# Patient Record
Sex: Male | Born: 1960
Health system: Southern US, Community
[De-identification: ages and names within clinical notes are randomized; demographics above are authoritative.]

## PROBLEM LIST (undated history)

## (undated) DIAGNOSIS — E119 Type 2 diabetes mellitus without complications: Secondary | ICD-10-CM

## (undated) DIAGNOSIS — G709 Myoneural disorder, unspecified: Secondary | ICD-10-CM

## (undated) DIAGNOSIS — I10 Essential (primary) hypertension: Secondary | ICD-10-CM

## (undated) DIAGNOSIS — E785 Hyperlipidemia, unspecified: Secondary | ICD-10-CM

## (undated) HISTORY — DX: Essential (primary) hypertension: I10

## (undated) HISTORY — DX: Hyperlipidemia, unspecified: E78.5

## (undated) HISTORY — DX: Myoneural disorder, unspecified: G70.9

## (undated) HISTORY — DX: Type 2 diabetes mellitus without complications: E11.9

## (undated) HISTORY — PX: NO PAST SURGERIES: SHX2092

---

## 2014-06-27 ENCOUNTER — Ambulatory Visit (HOSPITAL_BASED_OUTPATIENT_CLINIC_OR_DEPARTMENT_OTHER): Payer: Self-pay | Admitting: *Deleted

## 2014-06-27 ENCOUNTER — Encounter: Payer: Self-pay | Admitting: Internal Medicine

## 2014-06-27 ENCOUNTER — Ambulatory Visit: Payer: Self-pay | Attending: Internal Medicine | Admitting: Internal Medicine

## 2014-06-27 VITALS — BP 142/102 | HR 100 | Temp 98.1°F | Resp 16 | Ht 67.0 in | Wt 158.0 lb

## 2014-06-27 DIAGNOSIS — Z113 Encounter for screening for infections with a predominantly sexual mode of transmission: Secondary | ICD-10-CM | POA: Insufficient documentation

## 2014-06-27 DIAGNOSIS — F17210 Nicotine dependence, cigarettes, uncomplicated: Secondary | ICD-10-CM | POA: Insufficient documentation

## 2014-06-27 DIAGNOSIS — F17200 Nicotine dependence, unspecified, uncomplicated: Secondary | ICD-10-CM

## 2014-06-27 DIAGNOSIS — Z23 Encounter for immunization: Secondary | ICD-10-CM

## 2014-06-27 DIAGNOSIS — IMO0001 Reserved for inherently not codable concepts without codable children: Secondary | ICD-10-CM

## 2014-06-27 DIAGNOSIS — Z720 Tobacco use: Secondary | ICD-10-CM

## 2014-06-27 DIAGNOSIS — R030 Elevated blood-pressure reading, without diagnosis of hypertension: Secondary | ICD-10-CM | POA: Insufficient documentation

## 2014-06-27 DIAGNOSIS — Z0000 Encounter for general adult medical examination without abnormal findings: Secondary | ICD-10-CM | POA: Insufficient documentation

## 2014-06-27 NOTE — Progress Notes (Signed)
Patient ID: Casey Petty, male   DOB: 29-Jun-1961, 53 y.o.   MRN: [id number]  YQM:[id number]  BMW:[id number]  DOB - 1960-11-05  CC:  Chief Complaint  Patient presents with  . Establish Care       HPI: Casey Petty is a 53 y.o. male with no past medical history, here today to establish medical care.  Patient has no c/o today.  He does not currently take any medications.  He is requesting a physical exam. Reports that his BP was controlled when he was incarcerated.  He denies receiving medication when he was incarcerated.   PMHx: none Surgical Hx: none Social Hx: tobacco use--7 cigs per day, denies drug use. Drinks two cans of beer/day   Patient has No headache, No chest pain, No abdominal pain - No Nausea, No new weakness tingling or numbness, No Cough - SOB.  No Known Allergies History reviewed. No pertinent past medical history. No current outpatient prescriptions on file prior to visit.   No current facility-administered medications on file prior to visit.   History reviewed. No pertinent family history. History   Social History  . Marital Status: Single    Spouse Name: N/A    Number of Children: N/A  . Years of Education: N/A   Occupational History  . Not on file.   Social History Main Topics  . Smoking status: Heavy Tobacco Smoker  . Smokeless tobacco: Not on file  . Alcohol Use: 1.2 oz/week    2 Not specified per week  . Drug Use: No  . Sexual Activity: No   Other Topics Concern  . Not on file   Social History Narrative  . No narrative on file    Review of Systems  All other systems reviewed and are negative.     Objective:   Filed Vitals:   06/27/14 1509  BP: 161/100  Pulse: 100  Temp: 98.1 F (36.7 C)  Resp: 16    Physical Exam: Constitutional: Patient appears well-developed and well-nourished. No distress. HENT: Normocephalic, atraumatic, External right and left ear normal. Oropharynx is clear and moist.  Eyes: Conjunctivae and EOM are  normal. PERRLA, no scleral icterus. Neck: Normal ROM. Neck supple. No JVD. No tracheal deviation. No thyromegaly. CVS: RRR, S1/S2 +, no murmurs, no gallops, no carotid bruit.  Pulmonary: Effort and breath sounds normal, no stridor, rhonchi, wheezes, rales.  Abdominal: Soft. BS +, no distension, tenderness, rebound or guarding.  Musculoskeletal: Normal range of motion. No edema and no tenderness.  Lymphadenopathy: No lymphadenopathy noted, cervical Neuro: Alert. Normal reflexes, muscle tone coordination. No cranial nerve deficit. Skin: Skin is warm and dry. No rash noted. Not diaphoretic. No erythema. No pallor. Psychiatric: Normal mood and affect. Behavior, judgment, thought content normal.  No results found for: WBC, HGB, HCT, MCV, PLT No results found for: CREATININE, BUN, NA, K, CL, CO2  No results found for: HGBA1C Lipid Panel  No results found for: CHOL, TRIG, HDL, CHOLHDL, VLDL, LDLCALC     Assessment and plan:   Casey Petty was seen today for establish care.  Diagnoses and associated orders for this visit:  Annual physical exam - CBC with Differential; Future - COMPLETE METABOLIC PANEL WITH GFR; Future - Lipid panel; Future - PSA; Future  Elevated BP Patient refused medication today, I will have him follow up in 3 weeks for a BP check and further education.     Explained to patient diet modifications that may contribute to elevated BP. Patient will avoid foods that are  high in sodium such as  canned soups and vegetables, tomato juice, commercial baked goods, commercially prepared frozen or canned entrees and sauces. Avoid salty snacks, added salt when cooking, and substituting for low sodium herbs or spices Explained exercise regimen of cardio at least three times weekly to help lower BP and cholesterol  Smoking Smoking cessation discussed for 3 minutes, patient is not willing to quit at this time. Will continue to assess on each visit. Discussed increased risk for diseases  such as cancer, heart disease, and stroke.   Screening for STD (sexually transmitted disease) - GC/chlamydia probe amp, urine   Return in about 3 weeks (around 07/18/2014) for Nurse Visit-BP check and lab visit.     Chari Manning, NP-C [facility] [phone] 06/27/2014, 3:50 PM

## 2014-06-27 NOTE — Patient Instructions (Signed)

## 2014-06-27 NOTE — Progress Notes (Signed)
Pt is here to establish care. Pt is requesting a physical. Pt has no C.C.  Or a history of an illness.

## 2014-06-30 ENCOUNTER — Ambulatory Visit: Payer: Self-pay | Admitting: Internal Medicine

## 2014-07-04 LAB — GC/CHLAMYDIA PROBE AMP, URINE
CHLAMYDIA, SWAB/URINE, PCR: NEGATIVE
GC PROBE AMP, URINE: NEGATIVE

## 2014-07-07 ENCOUNTER — Telehealth: Payer: Self-pay | Admitting: *Deleted

## 2014-07-07 NOTE — Telephone Encounter (Signed)
Pt aware of test results

## 2014-07-07 NOTE — Telephone Encounter (Signed)
-----   Message from Lance Bosch, NP sent at 07/07/2014 12:09 AM EST ----- Negative for GC/Chlamydia

## 2014-07-18 ENCOUNTER — Ambulatory Visit: Payer: Medicaid Other | Attending: Internal Medicine | Admitting: *Deleted

## 2014-07-18 DIAGNOSIS — Z0000 Encounter for general adult medical examination without abnormal findings: Secondary | ICD-10-CM | POA: Insufficient documentation

## 2014-07-18 LAB — CBC WITH DIFFERENTIAL/PLATELET
BASOS ABS: 0 10*3/uL (ref 0.0–0.1)
Basophils Relative: 0 % (ref 0–1)
EOS ABS: 0.4 10*3/uL (ref 0.0–0.7)
EOS PCT: 5 % (ref 0–5)
HCT: 43.7 % (ref 39.0–52.0)
Hemoglobin: 15.1 g/dL (ref 13.0–17.0)
Lymphocytes Relative: 40 % (ref 12–46)
Lymphs Abs: 3 10*3/uL (ref 0.7–4.0)
MCH: 30.6 pg (ref 26.0–34.0)
MCHC: 34.6 g/dL (ref 30.0–36.0)
MCV: 88.6 fL (ref 78.0–100.0)
MPV: 10.5 fL (ref 9.4–12.4)
Monocytes Absolute: 0.5 10*3/uL (ref 0.1–1.0)
Monocytes Relative: 7 % (ref 3–12)
NEUTROS PCT: 48 % (ref 43–77)
Neutro Abs: 3.6 10*3/uL (ref 1.7–7.7)
Platelets: 212 10*3/uL (ref 150–400)
RBC: 4.93 MIL/uL (ref 4.22–5.81)
RDW: 14.4 % (ref 11.5–15.5)
WBC: 7.6 10*3/uL (ref 4.0–10.5)

## 2014-07-18 LAB — COMPLETE METABOLIC PANEL WITH GFR
ALT: 41 U/L (ref 0–53)
AST: 52 U/L — AB (ref 0–37)
Albumin: 4.3 g/dL (ref 3.5–5.2)
Alkaline Phosphatase: 71 U/L (ref 39–117)
BILIRUBIN TOTAL: 0.7 mg/dL (ref 0.2–1.2)
BUN: 15 mg/dL (ref 6–23)
CALCIUM: 9.4 mg/dL (ref 8.4–10.5)
CHLORIDE: 103 meq/L (ref 96–112)
CO2: 19 mEq/L (ref 19–32)
Creat: 1.14 mg/dL (ref 0.50–1.35)
GFR, Est African American: 86 mL/min
GFR, Est Non African American: 74 mL/min
Glucose, Bld: 117 mg/dL — ABNORMAL HIGH (ref 70–99)
Potassium: 4.6 mEq/L (ref 3.5–5.3)
SODIUM: 136 meq/L (ref 135–145)
Total Protein: 7.3 g/dL (ref 6.0–8.3)

## 2014-07-18 LAB — LIPID PANEL
Cholesterol: 133 mg/dL (ref 0–200)
HDL: 38 mg/dL — AB (ref >39)
TRIGLYCERIDES: 411 mg/dL — AB (ref <150)
Total CHOL/HDL Ratio: 3.5 Ratio

## 2014-07-18 MED ORDER — AMLODIPINE BESYLATE 10 MG PO TABS: 10.0000 mg | ORAL_TABLET | Freq: Every day | ORAL | Status: DC

## 2014-07-18 NOTE — Progress Notes (Signed)
Patient presents for BP check and fasting blood work Takes no meds but is willing to start on BP med if BP remains elevated today Smoking 4 cigs/day States following low sodium diet  BP 178/96 left arm manually with adult cuff P 90 R 16  T  98.7 oral SPO2  98%  Per Provider: Begin amlodipine 10 mg daily (e-scribed to [facility] on [address]) Return in 2 weeks for nurse visit for BP check

## 2014-07-19 LAB — PSA: PSA: 0.42 ng/mL (ref <=4.00)

## 2014-07-21 ENCOUNTER — Telehealth: Payer: Self-pay | Admitting: *Deleted

## 2014-07-21 NOTE — Telephone Encounter (Signed)
Attempted to reach patient to discuss lab results, however, line is busy. Will call back.

## 2014-08-05 ENCOUNTER — Ambulatory Visit: Payer: Medicaid Other | Attending: Internal Medicine

## 2014-08-05 NOTE — Progress Notes (Unsigned)
Patient ID: Casey Petty, male   DOB: October 16, 1960, 53 y.o.   MRN: [id number] Pt comes in for Blood pressure recheck after started on Amlodipine 10 mg tab 2 weeks ago during nurse triage visit. Pt is compliant with taking medication daily Admits to smoking 4 cigarettes/daily with watching Sodium intake/reading labels Education given on smoking cessation  BP-144/83 73 denies headache,blurred vision or dizziness Instructed pt to continue taking prescribed medication regularly and cut back on smoking We will monitor BP during next visit/DASH diet literature given

## 2014-08-05 NOTE — Patient Instructions (Signed)
Hypertension Hypertension, commonly called high blood pressure, is when the force of blood pumping through your arteries is too strong. Your arteries are the blood vessels that carry blood from your heart throughout your body. A blood pressure reading consists of a higher number over a lower number, such as 110/72. The higher number (systolic) is the pressure inside your arteries when your heart pumps. The lower number (diastolic) is the pressure inside your arteries when your heart relaxes. Ideally you want your blood pressure below 120/80. Hypertension forces your heart to work harder to pump blood. Your arteries may become narrow or stiff. Having hypertension puts you at risk for heart disease, stroke, and other problems.  RISK FACTORS Some risk factors for high blood pressure are controllable. Others are not.  Risk factors you cannot control include:   Race. You may be at higher risk if you are African American.  Age. Risk increases with age.  Gender. Men are at higher risk than women before age 45 years. After age 65, women are at higher risk than men. Risk factors you can control include:  Not getting enough exercise or physical activity.  Being overweight.  Getting too much fat, sugar, calories, or salt in your diet.  Drinking too much alcohol. SIGNS AND SYMPTOMS Hypertension does not usually cause signs or symptoms. Extremely high blood pressure (hypertensive crisis) may cause headache, anxiety, shortness of breath, and nosebleed. DIAGNOSIS  To check if you have hypertension, your health care provider will measure your blood pressure while you are seated, with your arm held at the level of your heart. It should be measured at least twice using the same arm. Certain conditions can cause a difference in blood pressure between your right and left arms. A blood pressure reading that is higher than normal on one occasion does not mean that you need treatment. If one blood pressure reading  is high, ask your health care provider about having it checked again. TREATMENT  Treating high blood pressure includes making lifestyle changes and possibly taking medicine. Living a healthy lifestyle can help lower high blood pressure. You may need to change some of your habits. Lifestyle changes may include:  Following the DASH diet. This diet is high in fruits, vegetables, and whole grains. It is low in salt, red meat, and added sugars.  Getting at least 2 hours of brisk physical activity every week.  Losing weight if necessary.  Not smoking.  Limiting alcoholic beverages.  Learning ways to reduce stress. If lifestyle changes are not enough to get your blood pressure under control, your health care provider may prescribe medicine. You may need to take more than one. Work closely with your health care provider to understand the risks and benefits. HOME CARE INSTRUCTIONS  Have your blood pressure rechecked as directed by your health care provider.   Take medicines only as directed by your health care provider. Follow the directions carefully. Blood pressure medicines must be taken as prescribed. The medicine does not work as well when you skip doses. Skipping doses also puts you at risk for problems.   Do not smoke.   Monitor your blood pressure at home as directed by your health care provider. SEEK MEDICAL CARE IF:   You think you are having a reaction to medicines taken.  You have recurrent headaches or feel dizzy.  You have swelling in your ankles.  You have trouble with your vision. SEEK IMMEDIATE MEDICAL CARE IF:  You develop a severe headache or confusion.    You have unusual weakness, numbness, or feel faint.  You have severe chest or abdominal pain.  You vomit repeatedly.  You have trouble breathing. MAKE SURE YOU:   Understand these instructions.  Will watch your condition.  Will get help right away if you are not doing well or get worse. Document  Released: 07/25/2005 Document Revised: 12/09/2013 Document Reviewed: 05/17/2013 ExitCare Patient Information 2015 [employer]. This information is not intended to replace advice given to you by your health care provider. Make sure you discuss any questions you have with your health care provider. DASH Eating Plan DASH stands for "Dietary Approaches to Stop Hypertension." The DASH eating plan is a healthy eating plan that has been shown to reduce high blood pressure (hypertension). Additional health benefits may include reducing the risk of type 2 diabetes mellitus, heart disease, and stroke. The DASH eating plan may also help with weight loss. WHAT DO I NEED TO KNOW ABOUT THE DASH EATING PLAN? For the DASH eating plan, you will follow these general guidelines:  Choose foods with a percent daily value for sodium of less than 5% (as listed on the food label).  Use salt-free seasonings or herbs instead of table salt or sea salt.  Check with your health care provider or pharmacist before using salt substitutes.  Eat lower-sodium products, often labeled as "lower sodium" or "no salt added."  Eat fresh foods.  Eat more vegetables, fruits, and low-fat dairy products.  Choose whole grains. Look for the word "whole" as the first word in the ingredient list.  Choose fish and skinless chicken or turkey more often than red meat. Limit fish, poultry, and meat to 6 oz (170 g) each day.  Limit sweets, desserts, sugars, and sugary drinks.  Choose heart-healthy fats.  Limit cheese to 1 oz (28 g) per day.  Eat more home-cooked food and less restaurant, buffet, and fast food.  Limit fried foods.  Cook foods using methods other than frying.  Limit canned vegetables. If you do use them, rinse them well to decrease the sodium.  When eating at a restaurant, ask that your food be prepared with less salt, or no salt if possible. WHAT FOODS CAN I EAT? Seek help from a dietitian for individual  calorie needs. Grains Whole grain or whole wheat bread. Brown rice. Whole grain or whole wheat pasta. Quinoa, bulgur, and whole grain cereals. Low-sodium cereals. Corn or whole wheat flour tortillas. Whole grain cornbread. Whole grain crackers. Low-sodium crackers. Vegetables Fresh or frozen vegetables (raw, steamed, roasted, or grilled). Low-sodium or reduced-sodium tomato and vegetable juices. Low-sodium or reduced-sodium tomato sauce and paste. Low-sodium or reduced-sodium canned vegetables.  Fruits All fresh, canned (in natural juice), or frozen fruits. Meat and Other Protein Products Ground beef (85% or leaner), grass-fed beef, or beef trimmed of fat. Skinless chicken or turkey. Ground chicken or turkey. Pork trimmed of fat. All fish and seafood. Eggs. Dried beans, peas, or lentils. Unsalted nuts and seeds. Unsalted canned beans. Dairy Low-fat dairy products, such as skim or 1% milk, 2% or reduced-fat cheeses, low-fat ricotta or cottage cheese, or plain low-fat yogurt. Low-sodium or reduced-sodium cheeses. Fats and Oils Tub margarines without trans fats. Light or reduced-fat mayonnaise and salad dressings (reduced sodium). Avocado. Safflower, olive, or canola oils. Natural peanut or almond butter. Other Unsalted popcorn and pretzels. The items listed above may not be a complete list of recommended foods or beverages. Contact your dietitian for more options. WHAT FOODS ARE NOT RECOMMENDED? Grains White bread.   White pasta. White rice. Refined cornbread. Bagels and croissants. Crackers that contain trans fat. Vegetables Creamed or fried vegetables. Vegetables in a cheese sauce. Regular canned vegetables. Regular canned tomato sauce and paste. Regular tomato and vegetable juices. Fruits Dried fruits. Canned fruit in light or heavy syrup. Fruit juice. Meat and Other Protein Products Fatty cuts of meat. Ribs, chicken wings, bacon, sausage, bologna, salami, chitterlings, fatback, hot dogs,  bratwurst, and packaged luncheon meats. Salted nuts and seeds. Canned beans with salt. Dairy Whole or 2% milk, cream, half-and-half, and cream cheese. Whole-fat or sweetened yogurt. Full-fat cheeses or blue cheese. Nondairy creamers and whipped toppings. Processed cheese, cheese spreads, or cheese curds. Condiments Onion and garlic salt, seasoned salt, table salt, and sea salt. Canned and packaged gravies. Worcestershire sauce. Tartar sauce. Barbecue sauce. Teriyaki sauce. Soy sauce, including reduced sodium. Steak sauce. Fish sauce. Oyster sauce. Cocktail sauce. Horseradish. Ketchup and mustard. Meat flavorings and tenderizers. Bouillon cubes. Hot sauce. Tabasco sauce. Marinades. Taco seasonings. Relishes. Fats and Oils Butter, stick margarine, lard, shortening, ghee, and bacon fat. Coconut, palm kernel, or palm oils. Regular salad dressings. Other Pickles and olives. Salted popcorn and pretzels. The items listed above may not be a complete list of foods and beverages to avoid. Contact your dietitian for more information. WHERE CAN I FIND MORE INFORMATION? National Heart, Lung, and Blood Institute: www.nhlbi.nih.gov/health/health-topics/topics/dash/ Document Released: 07/14/2011 Document Revised: 12/09/2013 Document Reviewed: 05/29/2013 ExitCare Patient Information 2015 ExitCare, [employer]. This information is not intended to replace advice given to you by your health care provider. Make sure you discuss any questions you have with your health care provider.  

## 2014-08-18 ENCOUNTER — Ambulatory Visit: Payer: Medicaid Other

## 2014-08-22 ENCOUNTER — Other Ambulatory Visit: Payer: Self-pay | Admitting: Internal Medicine

## 2014-08-22 NOTE — Telephone Encounter (Signed)
Patient called to request a med refill for amLODipine (NORVASC) 10 MG tablet. Please f/u with pt. °

## 2014-08-22 NOTE — Telephone Encounter (Signed)
Pt notified still have one more refill at the pharmacy

## 2015-07-24 ENCOUNTER — Emergency Department (HOSPITAL_COMMUNITY)
Admission: EM | Admit: 2015-07-24 | Discharge: 2015-07-24 | Disposition: A | Discharge status: Home / Self Care | Payer: Medicaid Other | Attending: Emergency Medicine | Admitting: Emergency Medicine

## 2015-07-24 ENCOUNTER — Emergency Department (HOSPITAL_COMMUNITY): Payer: Medicaid Other

## 2015-07-24 ENCOUNTER — Encounter (HOSPITAL_COMMUNITY): Payer: Self-pay

## 2015-07-24 DIAGNOSIS — F17200 Nicotine dependence, unspecified, uncomplicated: Secondary | ICD-10-CM | POA: Insufficient documentation

## 2015-07-24 DIAGNOSIS — I10 Essential (primary) hypertension: Secondary | ICD-10-CM | POA: Diagnosis not present

## 2015-07-24 DIAGNOSIS — M546 Pain in thoracic spine: Secondary | ICD-10-CM | POA: Diagnosis present

## 2015-07-24 DIAGNOSIS — R739 Hyperglycemia, unspecified: Secondary | ICD-10-CM

## 2015-07-24 LAB — I-STAT CHEM 8, ED
BUN: 11 mg/dL (ref 6–20)
CALCIUM ION: 1.19 mmol/L (ref 1.12–1.23)
Chloride: 103 mmol/L (ref 101–111)
Creatinine, Ser: 1.1 mg/dL (ref 0.61–1.24)
Glucose, Bld: 121 mg/dL — ABNORMAL HIGH (ref 65–99)
HCT: 49 % (ref 39.0–52.0)
Hemoglobin: 16.7 g/dL (ref 13.0–17.0)
Potassium: 4.4 mmol/L (ref 3.5–5.1)
Sodium: 140 mmol/L (ref 135–145)
TCO2: 25 mmol/L (ref 0–100)

## 2015-07-24 MED ORDER — AMLODIPINE BESYLATE 10 MG PO TABS: 10.0000 mg | ORAL_TABLET | Freq: Every day | ORAL | Status: DC

## 2015-07-24 MED ORDER — AMLODIPINE BESYLATE 5 MG PO TABS
10.0000 mg | ORAL_TABLET | Freq: Once | ORAL | Status: AC
  Administered 2015-07-24: 10 mg via ORAL
  Filled 2015-07-24: qty 2

## 2015-07-24 MED ORDER — NAPROXEN 500 MG PO TABS: 500.0000 mg | ORAL_TABLET | Freq: Two times a day (BID) | ORAL | Status: DC | PRN

## 2015-07-24 NOTE — Discharge Instructions (Signed)
Read the information below.  Use the prescribed medication as directed.  Please discuss all new medications with your pharmacist.  You may return to the Emergency Department at any time for worsening condition or any new symptoms that concern you.     If you develop fevers, loss of control of bowel or bladder, weakness or numbness in your legs, or are unable to walk, return to the ER for a recheck.     Back Pain, Adult Back pain is very common. The pain often gets better over time. The cause of back pain is usually not dangerous. Most people can learn to manage their back pain on their own.  HOME CARE  Watch your back pain for any changes. The following actions may help to lessen any pain you are feeling:  Stay active. Start with short walks on flat ground if you can. Try to walk farther each day.  Exercise regularly as told by your doctor. Exercise helps your back heal faster. It also helps avoid future injury by keeping your muscles strong and flexible.  Do not sit, drive, or stand in one place for more than 30 minutes.  Do not stay in bed. Resting more than 1-2 days can slow down your recovery.  Be careful when you bend or lift an object. Use good form when lifting:  Bend at your knees.  Keep the object close to your body.  Do not twist.  Sleep on a firm mattress. Lie on your side, and bend your knees. If you lie on your back, put a pillow under your knees.  Take medicines only as told by your doctor.  Put ice on the injured area.  Put ice in a plastic bag.  Place a towel between your skin and the bag.  Leave the ice on for 20 minutes, 2-3 times a day for the first 2-3 days. After that, you can switch between ice and heat packs.  Avoid feeling anxious or stressed. Find good ways to deal with stress, such as exercise.  Maintain a healthy weight. Extra weight puts stress on your back. GET HELP IF:   You have pain that does not go away with rest or medicine.  You have  worsening pain that goes down into your legs or buttocks.  You have pain that does not get better in one week.  You have pain at night.  You lose weight.  You have a fever or chills. GET HELP RIGHT AWAY IF:   You cannot control when you poop (bowel movement) or pee (urinate).  Your arms or legs feel weak.  Your arms or legs lose feeling (numbness).  You feel sick to your stomach (nauseous) or throw up (vomit).  You have belly (abdominal) pain.  You feel like you may pass out (faint).   This information is not intended to replace advice given to you by your health care provider. Make sure you discuss any questions you have with your health care provider.   Document Released: 01/11/2008 Document Revised: 08/15/2014 Document Reviewed: 11/26/2013 Elsevier Interactive Patient Education 2016 Elsevier Inc.  Hyperglycemia High blood sugar (hyperglycemia) means that the level of sugar in your blood is higher than it should be. Signs of high blood sugar include:  Feeling thirsty.  Frequent peeing (urinating).  Feeling tired or sleepy.  Dry mouth.  Vision changes.  Feeling weak.  Feeling hungry but losing weight.  Numbness and tingling in your hands or feet.  Headache. When you ignore these signs, your  blood sugar may keep going up. These problems may get worse, and other problems may begin. HOME CARE  Check your blood sugars as told by your doctor. Write down the numbers with the date and time.  Take the right amount of insulin or diabetes pills at the right time. Write down the dose with date and time.  Refill your insulin or diabetes pills before running out.  Watch what you eat. Follow your meal plan.  Drink liquids without sugar, such as water. Check with your doctor if you have kidney or heart disease.  Follow your doctor's orders for exercise. Exercise at the same time of day.  Keep your doctor's appointments. GET HELP RIGHT AWAY IF:   You have trouble  thinking or are confused.  You have fast breathing with fruity smelling breath.  You pass out (faint).  You have 2 to 3 days of high blood sugars and you do not know why.  You have chest pain.  You are feeling sick to your stomach (nauseous) or throwing up (vomiting).  You have sudden vision changes. MAKE SURE YOU:   Understand these instructions.  Will watch your condition.  Will get help right away if you are not doing well or get worse.   This information is not intended to replace advice given to you by your health care provider. Make sure you discuss any questions you have with your health care provider.   Document Released: 05/22/2009 Document Revised: 08/15/2014 Document Reviewed: 03/31/2015 Elsevier Interactive Patient Education 2016 [employer].  Hypertension Hypertension is another name for high blood pressure. High blood pressure forces your heart to work harder to pump blood. A blood pressure reading has two numbers, which includes a higher number over a lower number (example: 110/72). HOME CARE   Have your blood pressure rechecked by your doctor.  Only take medicine as told by your doctor. Follow the directions carefully. The medicine does not work as well if you skip doses. Skipping doses also puts you at risk for problems.  Do not smoke.  Monitor your blood pressure at home as told by your doctor. GET HELP IF:  You think you are having a reaction to the medicine you are taking.  You have repeat headaches or feel dizzy.  You have puffiness (swelling) in your ankles.  You have trouble with your vision. GET HELP RIGHT AWAY IF:   You get a very bad headache and are confused.  You feel weak, numb, or faint.  You get chest or belly (abdominal) pain.  You throw up (vomit).  You cannot breathe very well. MAKE SURE YOU:   Understand these instructions.  Will watch your condition.  Will get help right away if you are not doing well or get worse.    This information is not intended to replace advice given to you by your health care provider. Make sure you discuss any questions you have with your health care provider.   Document Released: 01/11/2008 Document Revised: 07/30/2013 Document Reviewed: 05/17/2013 Elsevier Interactive Patient Education [employer].

## 2015-07-24 NOTE — ED Notes (Signed)
Pt states lower right side started hurting last night. No injuries or precipitating factors. Ambulatory w/ steady gait to room from lobby. Denies any urinary symptoms. Pain worse w/ movement, not worse w/ deep breathing. Did not take any meds or try heat/ice to attempt to relieve symptoms. Pt is hypertensive - states he was taking BP meds at one time but ran out of pills.

## 2015-07-24 NOTE — ED Notes (Signed)
Pt verbalized understanding of d/c instructions, prescriptions, and follow-up care. No further questions/concerns, VSS, ambulatory w/ steady gait (refused wheelchair) 

## 2015-07-24 NOTE — ED Provider Notes (Signed)
CSN: [id number]     Arrival date & time 07/24/15  [id number] History   First MD Initiated Contact with Patient 07/24/15 [phone]     Chief Complaint  Patient presents with  . Back Pain     (Consider location/radiation/quality/duration/timing/severity/associated sxs/prior Treatment) The history is provided by the patient.     Pt with hx HTN (untreated) p/w right upper/mid back pain that began yesterday.  Pain is worse with movement, certain positions, slightly with deep inspiration.  The pain does not radiate.  Took no medications for pain.  Denies fever, CP, cough, SOB, abdominal pain, urinary symptoms. Denies any injury or fall.  No recent travel or immobilization, no leg swelling.   Has PCP but has not been in many months.   Past Medical History  Diagnosis Date  . Hypertension    History reviewed. No pertinent past surgical history. History reviewed. No pertinent family history. Social History  Substance Use Topics  . Smoking status: Current Every Day Smoker  . Smokeless tobacco: None     Comment: Smoking 4 cigs/day  . Alcohol Use: 1.2 oz/week    2 Standard drinks or equivalent per week    Review of Systems  All other systems reviewed and are negative.     Allergies  Review of patient's allergies indicates no known allergies.  Home Medications   Prior to Admission medications   Medication Sig Start Date End Date Taking? Authorizing Provider  amLODipine (NORVASC) 10 MG tablet Take 1 tablet (10 mg total) by mouth daily. Patient not taking: Reported on 07/24/2015 07/18/14   Lance Bosch, NP   BP 162/96 mmHg  Pulse 98  Temp(Src) 98.5 F (36.9 C)  Resp 16  SpO2 96% Physical Exam  Constitutional: He appears well-developed and well-nourished. No distress.  HENT:  Head: Normocephalic and atraumatic.  Neck: Neck supple.  Cardiovascular: Normal rate and regular rhythm.   Pulmonary/Chest: Effort normal and breath sounds normal. No respiratory distress. He has no decreased  breath sounds. He has no wheezes. He has no rhonchi. He has no rales.  Right thoracic back mildly tender to palpation.  NO skin changes or rash.   Abdominal: Soft. He exhibits no distension. There is no tenderness. There is no rebound and no guarding.  Musculoskeletal: He exhibits no edema.  Spine nontender, no crepitus, or stepoffs. Gait is normal.   Neurological: He is alert. He exhibits normal muscle tone.  Skin: He is not diaphoretic.  Nursing note and vitals reviewed.   ED Course  Procedures (including critical care time) Labs Review Labs Reviewed  I-STAT CHEM 8, ED - Abnormal; Notable for the following:    Glucose, Bld 121 (*)    All other components within normal limits    Imaging Review Dg Chest 2 View  07/24/2015  CLINICAL DATA:  Right upper back pain EXAM: CHEST  2 VIEW COMPARISON:  None. FINDINGS: The heart size and mediastinal contours are within normal limits. Both lungs are clear. The visualized skeletal structures are unremarkable. IMPRESSION: No active cardiopulmonary disease. Electronically Signed   By: Franchot Gallo M.D.   On: 07/24/2015 11:32   I have personally reviewed and evaluated these images and lab results as part of my medical decision-making.   EKG Interpretation None      MDM   Final diagnoses:  Essential hypertension  Hyperglycemia  Right-sided thoracic back pain    Afebrile, nontoxic patient with right upper back pain and uncontrolled HTN.  Also found to be hyperglycemic.  Chem 8 otherwise unremarkable.  CXR negative.  No risk factors for PE.  No urinary symptoms to suggest kidney stone.  Likely muscular pain.    D/C home with naprosyn, refill of patient's home Norvasc.  Strongly advised to follow closely with his PCP for HTN maintenance and blood glucose recheck.   Discussed result, findings, treatment, and follow up  with patient.  Pt given return precautions.  Pt verbalizes understanding and agrees with plan.         Clayton Bibles,  PA-C 07/24/15 1327  Julianne Rice, MD 07/28/15 [phone]

## 2016-12-22 ENCOUNTER — Encounter (HOSPITAL_COMMUNITY): Payer: Self-pay | Admitting: Emergency Medicine

## 2016-12-22 ENCOUNTER — Emergency Department (HOSPITAL_COMMUNITY)
Admission: EM | Admit: 2016-12-22 | Discharge: 2016-12-22 | Disposition: A | Discharge status: Home / Self Care | Payer: Medicaid Other | Attending: Emergency Medicine | Admitting: Emergency Medicine

## 2016-12-22 DIAGNOSIS — M545 Low back pain, unspecified: Secondary | ICD-10-CM

## 2016-12-22 DIAGNOSIS — I10 Essential (primary) hypertension: Secondary | ICD-10-CM | POA: Insufficient documentation

## 2016-12-22 DIAGNOSIS — F17200 Nicotine dependence, unspecified, uncomplicated: Secondary | ICD-10-CM | POA: Insufficient documentation

## 2016-12-22 DIAGNOSIS — Z79899 Other long term (current) drug therapy: Secondary | ICD-10-CM | POA: Insufficient documentation

## 2016-12-22 MED ORDER — METHOCARBAMOL 500 MG PO TABS
1000.0000 mg | ORAL_TABLET | Freq: Once | ORAL | Status: AC
  Administered 2016-12-22: 1000 mg via ORAL
  Filled 2016-12-22: qty 2

## 2016-12-22 MED ORDER — NAPROXEN 500 MG PO TABS: 500.0000 mg | ORAL_TABLET | Freq: Two times a day (BID) | ORAL | 0 refills | Status: DC

## 2016-12-22 MED ORDER — IBUPROFEN 800 MG PO TABS
800.0000 mg | ORAL_TABLET | Freq: Once | ORAL | Status: AC
Start: 2016-12-22 — End: 2016-12-22
  Administered 2016-12-22: 800 mg via ORAL
  Filled 2016-12-22: qty 1

## 2016-12-22 MED ORDER — METHOCARBAMOL 500 MG PO TABS: 500.0000 mg | ORAL_TABLET | Freq: Three times a day (TID) | ORAL | 0 refills | Status: DC | PRN

## 2016-12-22 MED ORDER — HYDROCODONE-ACETAMINOPHEN 5-325 MG PO TABS
1.0000 | ORAL_TABLET | Freq: Once | ORAL | Status: AC
  Administered 2016-12-22: 1 via ORAL
  Filled 2016-12-22: qty 1

## 2016-12-22 MED ORDER — TRAMADOL HCL 50 MG PO TABS: 50.0000 mg | ORAL_TABLET | Freq: Four times a day (QID) | ORAL | 0 refills | Status: DC | PRN

## 2016-12-22 MED FILL — METHOCARBAMOL 500 MG TABLET: 500 | 6 days supply | Qty: 20 | Fill #0

## 2016-12-22 MED FILL — traMADol HCL 50 MG TABS: 50 | 2 days supply | Qty: 10 | Fill #0

## 2016-12-22 MED FILL — NAPROXEN 500 MG TABLET: 500 | 15 days supply | Qty: 30 | Fill #0

## 2016-12-22 NOTE — ED Notes (Signed)
Pt states he understands instructions. Home stable with steady gait. 

## 2016-12-22 NOTE — Discharge Instructions (Signed)
Medications as prescribed. Rest. Avoid upright activity or heavy lifting for the next 2-3 days.

## 2016-12-22 NOTE — ED Triage Notes (Signed)
Patient reports right lower back pain onset this week , denies injury , no fall or strenuous activity , denies urinary discomfort /hematuria .

## 2016-12-22 NOTE — ED Notes (Signed)
Pt sitting at bedside , fully dressed and states he is ready to go.

## 2016-12-22 NOTE — ED Provider Notes (Signed)
[facility] DEPT Provider Note   CSN: [id number] Arrival date & time: 12/22/16  [id number]     History   Chief Complaint Chief Complaint  Patient presents with  . Back Pain    HPI Casey Petty is a 56 y.o. male. Chief complaint is right back pain.  HPI: 56 year old male atraumatic right-sided back pain. History of similar episodes one or 2 times in the past. He works with a Scientist, water quality doing traffic control. He has prolonged standing. No particular inciting event with fall or injury. Right-sided intermittent spasms that time. No radiation to left back her right leg. No bowel or bladder symptoms. No red flags history. History of fever malignancy  Past Medical History:  Diagnosis Date  . Hypertension     There are no active problems to display for this patient.   History reviewed. No pertinent surgical history.     Home Medications    Prior to Admission medications   Medication Sig Start Date End Date Taking? Authorizing Provider  amLODipine (NORVASC) 10 MG tablet Take 1 tablet (10 mg total) by mouth daily. 07/24/15   Clayton Bibles, PA-C  methocarbamol (ROBAXIN) 500 MG tablet Take 1 tablet (500 mg total) by mouth 3 (three) times daily between meals as needed. 12/22/16   Tanna Furry, MD  naproxen (NAPROSYN) 500 MG tablet Take 1 tablet (500 mg total) by mouth 2 (two) times daily. 12/22/16   Tanna Furry, MD  traMADol (ULTRAM) 50 MG tablet Take 1 tablet (50 mg total) by mouth every 6 (six) hours as needed. 12/22/16   Tanna Furry, MD    Family History No family history on file.  Social History Social History  Substance Use Topics  . Smoking status: Current Every Day Smoker  . Smokeless tobacco: Never Used     Comment: Smoking 4 cigs/day  . Alcohol use 1.2 oz/week    2 Standard drinks or equivalent per week     Allergies   Patient has no known allergies.   Review of Systems Review of Systems  Constitutional: Negative for appetite change, chills, diaphoresis,  fatigue and fever.  HENT: Negative for mouth sores, sore throat and trouble swallowing.   Eyes: Negative for visual disturbance.  Respiratory: Negative for cough, chest tightness, shortness of breath and wheezing.   Cardiovascular: Negative for chest pain.  Gastrointestinal: Negative for abdominal distention, abdominal pain, diarrhea, nausea and vomiting.  Endocrine: Negative for polydipsia, polyphagia and polyuria.  Genitourinary: Negative for dysuria, frequency and hematuria.  Musculoskeletal: Positive for back pain. Negative for gait problem.  Skin: Negative for color change, pallor and rash.  Neurological: Negative for dizziness, syncope, light-headedness and headaches.  Hematological: Does not bruise/bleed easily.  Psychiatric/Behavioral: Negative for behavioral problems and confusion.     Physical Exam Updated Vital Signs BP (!) 163/103 (BP Location: Right Arm)   Pulse 74   Temp 98 F (36.7 C) (Oral)   Resp 16   Ht 5\' 7"  (1.702 m)   Wt 165 lb (74.8 kg)   SpO2 99%   BMI 25.84 kg/m   Physical Exam  Constitutional: He is oriented to person, place, and time. He appears well-developed and well-nourished. No distress.  HENT:  Head: Normocephalic.  Eyes: Conjunctivae are normal. Pupils are equal, round, and reactive to light. No scleral icterus.  Neck: Normal range of motion. Neck supple. No thyromegaly present.  Cardiovascular: Normal rate and regular rhythm.  Exam reveals no gallop and no friction rub.   No murmur heard. Pulmonary/Chest: Effort normal  and breath sounds normal. No respiratory distress. He has no wheezes. He has no rales.  Abdominal: Soft. Bowel sounds are normal. He exhibits no distension. There is no tenderness. There is no rebound.  Musculoskeletal: Normal range of motion.  Tightness in the right lower back. All right of midline. No midline tenderness or left-sided symptoms.  Neurological: He is alert and oriented to person, place, and time.  Normal  symmetric Strength to shoulder shrug, triceps, biceps, grip,wrist flex/extend,and intrinsics  Norma lsymmetric sensation above and below clavicles, and to all distributions to UEs. Norma symmetric strength to flex/.extend hip and knees, dorsi/plantar flex ankles. Normal symmetric sensation to all distributions to LEs Patellar and achilles reflexes 1-2+. Downgoing Babinski   Skin: Skin is warm and dry. No rash noted.  Psychiatric: He has a normal mood and affect. His behavior is normal.     ED Treatments / Results  Labs (all labs ordered are listed, but only abnormal results are displayed) Labs Reviewed - No data to display  EKG  EKG Interpretation None       Radiology No results found.  Procedures Procedures (including critical care time)  Medications Ordered in ED Medications  HYDROcodone-acetaminophen (NORCO/VICODIN) 5-325 MG per tablet 1 tablet (not administered)  ibuprofen (ADVIL,MOTRIN) tablet 800 mg (not administered)  methocarbamol (ROBAXIN) tablet 1,000 mg (not administered)     Initial Impression / Assessment and Plan / ED Course  I have reviewed the triage vital signs and the nursing notes.  Pertinent labs & imaging results that were available during my care of the patient were reviewed by me and considered in my medical decision making (see chart for details).     Reproducible muscle skeletal pain and spasm. No symptoms or findings to suggest herniated nucleus or altered etiology. These were discussed with the patient. Given Vicodin and Motrin and Robaxin here. We'll discharge with limited number tramadol Robaxin ibuprofen. Primary care follow-up. Return to ER with acute changes as discussed.  Final Clinical Impressions(s) / ED Diagnoses   Final diagnoses:  Acute right-sided low back pain without sciatica    New Prescriptions New Prescriptions   METHOCARBAMOL (ROBAXIN) 500 MG TABLET    Take 1 tablet (500 mg total) by mouth 3 (three) times daily between  meals as needed.   NAPROXEN (NAPROSYN) 500 MG TABLET    Take 1 tablet (500 mg total) by mouth 2 (two) times daily.   TRAMADOL (ULTRAM) 50 MG TABLET    Take 1 tablet (50 mg total) by mouth every 6 (six) hours as needed.     Tanna Furry, MD 12/22/16 [phone]

## 2018-08-08 DIAGNOSIS — I639 Cerebral infarction, unspecified: Secondary | ICD-10-CM

## 2018-08-08 HISTORY — DX: Cerebral infarction, unspecified: I63.9

## 2019-02-28 ENCOUNTER — Emergency Department (HOSPITAL_COMMUNITY): Payer: Medicaid Other

## 2019-02-28 ENCOUNTER — Encounter (HOSPITAL_COMMUNITY): Payer: Self-pay | Admitting: Emergency Medicine

## 2019-02-28 ENCOUNTER — Other Ambulatory Visit: Payer: Self-pay

## 2019-02-28 ENCOUNTER — Observation Stay (HOSPITAL_COMMUNITY): Payer: Medicaid Other

## 2019-02-28 ENCOUNTER — Inpatient Hospital Stay (HOSPITAL_COMMUNITY)
Admission: EM | Admit: 2019-02-28 | Discharge: 2019-03-04 | DRG: 065 | Disposition: A | Discharge status: Rehabilitation Facility | Payer: Medicaid Other | Attending: Internal Medicine | Admitting: Internal Medicine

## 2019-02-28 DIAGNOSIS — M79661 Pain in right lower leg: Secondary | ICD-10-CM | POA: Diagnosis not present

## 2019-02-28 DIAGNOSIS — Z79899 Other long term (current) drug therapy: Secondary | ICD-10-CM

## 2019-02-28 DIAGNOSIS — Z716 Tobacco abuse counseling: Secondary | ICD-10-CM

## 2019-02-28 DIAGNOSIS — R29703 NIHSS score 3: Secondary | ICD-10-CM | POA: Diagnosis present

## 2019-02-28 DIAGNOSIS — Z8249 Family history of ischemic heart disease and other diseases of the circulatory system: Secondary | ICD-10-CM

## 2019-02-28 DIAGNOSIS — I119 Hypertensive heart disease without heart failure: Secondary | ICD-10-CM | POA: Diagnosis present

## 2019-02-28 DIAGNOSIS — Z20828 Contact with and (suspected) exposure to other viral communicable diseases: Secondary | ICD-10-CM | POA: Diagnosis present

## 2019-02-28 DIAGNOSIS — R471 Dysarthria and anarthria: Secondary | ICD-10-CM | POA: Diagnosis present

## 2019-02-28 DIAGNOSIS — T380X5A Adverse effect of glucocorticoids and synthetic analogues, initial encounter: Secondary | ICD-10-CM | POA: Diagnosis not present

## 2019-02-28 DIAGNOSIS — M1711 Unilateral primary osteoarthritis, right knee: Secondary | ICD-10-CM | POA: Diagnosis not present

## 2019-02-28 DIAGNOSIS — I639 Cerebral infarction, unspecified: Secondary | ICD-10-CM | POA: Diagnosis not present

## 2019-02-28 DIAGNOSIS — Z9114 Patient's other noncompliance with medication regimen: Secondary | ICD-10-CM

## 2019-02-28 DIAGNOSIS — I63312 Cerebral infarction due to thrombosis of left middle cerebral artery: Secondary | ICD-10-CM

## 2019-02-28 DIAGNOSIS — R29810 Facial weakness: Secondary | ICD-10-CM | POA: Diagnosis present

## 2019-02-28 DIAGNOSIS — R739 Hyperglycemia, unspecified: Secondary | ICD-10-CM | POA: Diagnosis present

## 2019-02-28 DIAGNOSIS — R0602 Shortness of breath: Secondary | ICD-10-CM | POA: Diagnosis not present

## 2019-02-28 DIAGNOSIS — M7989 Other specified soft tissue disorders: Secondary | ICD-10-CM | POA: Diagnosis not present

## 2019-02-28 DIAGNOSIS — E785 Hyperlipidemia, unspecified: Secondary | ICD-10-CM | POA: Diagnosis present

## 2019-02-28 DIAGNOSIS — G8191 Hemiplegia, unspecified affecting right dominant side: Secondary | ICD-10-CM | POA: Diagnosis present

## 2019-02-28 DIAGNOSIS — I10 Essential (primary) hypertension: Secondary | ICD-10-CM | POA: Diagnosis present

## 2019-02-28 DIAGNOSIS — D72829 Elevated white blood cell count, unspecified: Secondary | ICD-10-CM | POA: Diagnosis not present

## 2019-02-28 DIAGNOSIS — T464X5A Adverse effect of angiotensin-converting-enzyme inhibitors, initial encounter: Secondary | ICD-10-CM | POA: Diagnosis not present

## 2019-02-28 DIAGNOSIS — I6381 Other cerebral infarction due to occlusion or stenosis of small artery: Principal | ICD-10-CM | POA: Diagnosis present

## 2019-02-28 DIAGNOSIS — F17210 Nicotine dependence, cigarettes, uncomplicated: Secondary | ICD-10-CM | POA: Diagnosis present

## 2019-02-28 DIAGNOSIS — R29898 Other symptoms and signs involving the musculoskeletal system: Secondary | ICD-10-CM

## 2019-02-28 DIAGNOSIS — I160 Hypertensive urgency: Secondary | ICD-10-CM | POA: Diagnosis present

## 2019-02-28 DIAGNOSIS — Z720 Tobacco use: Secondary | ICD-10-CM | POA: Diagnosis present

## 2019-02-28 DIAGNOSIS — M5126 Other intervertebral disc displacement, lumbar region: Secondary | ICD-10-CM | POA: Diagnosis not present

## 2019-02-28 DIAGNOSIS — T783XXA Angioneurotic edema, initial encounter: Secondary | ICD-10-CM | POA: Diagnosis not present

## 2019-02-28 LAB — CBC WITH DIFFERENTIAL/PLATELET
Abs Immature Granulocytes: 0.06 10*3/uL (ref 0.00–0.07)
Basophils Absolute: 0.1 10*3/uL (ref 0.0–0.1)
Basophils Relative: 1 %
Eosinophils Absolute: 0.6 10*3/uL — ABNORMAL HIGH (ref 0.0–0.5)
Eosinophils Relative: 5 %
HCT: 47.8 % (ref 39.0–52.0)
Hemoglobin: 15.9 g/dL (ref 13.0–17.0)
Immature Granulocytes: 1 %
Lymphocytes Relative: 32 %
Lymphs Abs: 4.2 10*3/uL — ABNORMAL HIGH (ref 0.7–4.0)
MCH: 31.4 pg (ref 26.0–34.0)
MCHC: 33.3 g/dL (ref 30.0–36.0)
MCV: 94.3 fL (ref 80.0–100.0)
Monocytes Absolute: 1 10*3/uL (ref 0.1–1.0)
Monocytes Relative: 8 %
Neutro Abs: 7.1 10*3/uL (ref 1.7–7.7)
Neutrophils Relative %: 53 %
Platelets: 188 10*3/uL (ref 150–400)
RBC: 5.07 MIL/uL (ref 4.22–5.81)
RDW: 13.5 % (ref 11.5–15.5)
WBC: 13 10*3/uL — ABNORMAL HIGH (ref 4.0–10.5)
nRBC: 0 % (ref 0.0–0.2)

## 2019-02-28 LAB — BASIC METABOLIC PANEL
Anion gap: 10 (ref 5–15)
BUN: 9 mg/dL (ref 6–20)
CO2: 24 mmol/L (ref 22–32)
Calcium: 9.5 mg/dL (ref 8.9–10.3)
Chloride: 104 mmol/L (ref 98–111)
Creatinine, Ser: 0.91 mg/dL (ref 0.61–1.24)
GFR calc Af Amer: >60 mL/min (ref >60)
GFR calc non Af Amer: >60 mL/min (ref >60)
Glucose, Bld: 112 mg/dL — ABNORMAL HIGH (ref 70–99)
Potassium: 3.5 mmol/L (ref 3.5–5.1)
Sodium: 138 mmol/L (ref 135–145)

## 2019-02-28 LAB — ETHANOL: Alcohol, Ethyl (B): <10 mg/dL (ref <10)

## 2019-02-28 MED ORDER — ACETAMINOPHEN 650 MG RE SUPP: 650.0000 mg | RECTAL | Status: DC | PRN

## 2019-02-28 MED ORDER — ASPIRIN 300 MG RE SUPP: 300.0000 mg | Freq: Every day | RECTAL | Status: DC

## 2019-02-28 MED ORDER — ASPIRIN 325 MG PO TABS
325.0000 mg | ORAL_TABLET | Freq: Every day | ORAL | Status: DC
  Administered 2019-03-01: 325 mg via ORAL
  Filled 2019-02-28: qty 1

## 2019-02-28 MED ORDER — STROKE: EARLY STAGES OF RECOVERY BOOK
Freq: Once | Status: AC
  Administered 2019-03-01: 04:00:00
  Filled 2019-02-28 (×2): qty 1

## 2019-02-28 MED ORDER — ACETAMINOPHEN 160 MG/5ML PO SOLN: 650.0000 mg | ORAL | Status: DC | PRN

## 2019-02-28 MED ORDER — ACETAMINOPHEN 325 MG PO TABS
650.0000 mg | ORAL_TABLET | ORAL | Status: DC | PRN
  Administered 2019-03-02 – 2019-03-03 (×2): 650 mg via ORAL
  Filled 2019-02-28 (×2): qty 2

## 2019-02-28 NOTE — ED Provider Notes (Signed)
[facility] DEPT Provider Note   CSN: [id number] Arrival date & time: 02/28/19  2007    History   Chief Complaint Chief Complaint  Patient presents with   Leg Pain    Right Leg    HPI Casey Petty is a 58 y.o. male.     The history is provided by the patient and medical records. No language interpreter was used.  Leg Pain Associated symptoms: no back pain    Casey Petty is a 58 y.o. male  with a PMH of HTN who presents to the Emergency Department complaining of right leg pain, weakness and swelling.  He states that it began 2 days ago.  He denies any known injury. He says that it started bothering him after work. He feels as if his calf / knee are swollen. When asked about his weakness, he tells me since yesterday, he's been dragging his right leg and hobbling around. Denies back pain. Sometimes feels a little numbness to the thigh, but other times not. No saddle anesthesia.  No bowel or bladder incontinence.  No fevers.  Denies any upper extremity numbness or weakness.  No headaches or visual changes.  No difficulty with his speech.   Patient does state he has been told his blood pressure is high before.  He says that he was given blood pressure medications once but it did not have any refills.  This was several years ago.  He never saw a doctor and never took blood pressure medication more than that 1 month when initial prescription was given.   Past Medical History:  Diagnosis Date   Hypertension     Patient Active Problem List   Diagnosis Date Noted   Stroke [facility]) 02/28/2019   Essential hypertension 02/28/2019   Tobacco abuse 02/28/2019   Hyperglycemia 02/28/2019    History reviewed. No pertinent surgical history.      Home Medications    Prior to Admission medications   Medication Sig Start Date End Date Taking? Authorizing Provider  amLODipine (NORVASC) 10 MG tablet Take 1 tablet (10 mg total) by mouth daily. Patient  not taking: Reported on 02/28/2019 07/24/15   Clayton Bibles, PA-C  methocarbamol (ROBAXIN) 500 MG tablet Take 1 tablet (500 mg total) by mouth 3 (three) times daily between meals as needed. Patient not taking: Reported on 02/28/2019 12/22/16   Tanna Furry, MD  naproxen (NAPROSYN) 500 MG tablet Take 1 tablet (500 mg total) by mouth 2 (two) times daily. Patient not taking: Reported on 02/28/2019 12/22/16   Tanna Furry, MD  traMADol (ULTRAM) 50 MG tablet Take 1 tablet (50 mg total) by mouth every 6 (six) hours as needed. Patient not taking: Reported on 02/28/2019 12/22/16   Tanna Furry, MD    Family History History reviewed. No pertinent family history.  Social History Social History   Tobacco Use   Smoking status: Current Every Day Smoker   Smokeless tobacco: Never Used   Tobacco comment: Smoking 4 cigs/day  Substance Use Topics   Alcohol use: Yes    Alcohol/week: 2.0 standard drinks    Types: 2 Standard drinks or equivalent per week   Drug use: No     Allergies   Patient has no known allergies.   Review of Systems Review of Systems  Musculoskeletal: Positive for myalgias. Negative for back pain.  Skin: Negative for color change.  Neurological: Positive for weakness and numbness. Negative for dizziness, facial asymmetry, speech difficulty and headaches.  All other systems reviewed  and are negative.    Physical Exam Updated Vital Signs BP (!) 195/115 (BP Location: Left Arm)    Pulse 71    Temp 97.9 F (36.6 C) (Oral)    Resp 20    Ht 5\' 7"  (1.702 m)    Wt 74.8 kg    SpO2 97%    BMI 25.84 kg/m   Physical Exam Vitals signs and nursing note reviewed.  Constitutional:      General: He is not in acute distress.    Appearance: He is well-developed.  HENT:     Head: Normocephalic and atraumatic.  Neck:     Musculoskeletal: Neck supple.  Cardiovascular:     Rate and Rhythm: Normal rate and regular rhythm.     Heart sounds: Normal heart sounds. No murmur.  Pulmonary:      Effort: Pulmonary effort is normal. No respiratory distress.     Breath sounds: Normal breath sounds.  Abdominal:     General: There is no distension.     Palpations: Abdomen is soft.     Tenderness: There is no abdominal tenderness.  Musculoskeletal:     Comments: 4/5 strength to RLE, 5/5 in upper extremities and LLE.  No tenderness to the RLE. Mild swelling to the medial aspect of the knee. Normal patellar and achilles reflexes. 2+ DP bilaterally.  Skin:    General: Skin is warm and dry.  Neurological:     Mental Status: He is alert and oriented to person, place, and time.     Comments: Alert, oriented, thought content appropriate, able to give a coherent history. Speech is clear and goal oriented, able to follow commands.  Cranial Nerves:  II:  Peripheral visual fields grossly normal, pupils equal, round, reactive to light III, IV, VI: EOM intact bilaterally, ptosis not present V,VII: smile symmetric, eyes kept closed tightly against resistance, facial light touch sensation equal VIII: hearing grossly normal IX, X: symmetric soft palate movement, uvula elevates symmetrically  XI: bilateral shoulder shrug symmetric and strong XII: midline tongue extension Subjective decreased sensation to the right upper thigh when compared to left.  Unsteady gait.      ED Treatments / Results  Labs (all labs ordered are listed, but only abnormal results are displayed) Labs Reviewed  CBC WITH DIFFERENTIAL/PLATELET - Abnormal; Notable for the following components:      Result Value   WBC 13.0 (*)    Lymphs Abs 4.2 (*)    Eosinophils Absolute 0.6 (*)    All other components within normal limits  BASIC METABOLIC PANEL - Abnormal; Notable for the following components:   Glucose, Bld 112 (*)    All other components within normal limits  SARS CORONAVIRUS 2 (HOSPITAL ORDER, [facility])  ETHANOL  HIV ANTIBODY (ROUTINE TESTING W REFLEX)  HEMOGLOBIN A1C  LIPID PANEL     EKG None  Radiology Dg Tibia/fibula Right  Result Date: 02/28/2019 CLINICAL DATA:  Right leg pain and swelling, onset yesterday. EXAM: RIGHT TIBIA AND FIBULA - 2 VIEW COMPARISON:  Concurrent knee radiographs. FINDINGS: Cortical margins of the tibia and fibular intact. Proximal aspect of the lower extremity included on concurrent knee exam. There is no evidence of fracture or other focal bone lesions. Soft tissues are unremarkable. IMPRESSION: Negative radiographs of the right lower extremity. Electronically Signed   By: Keith Rake M.D.   On: 02/28/2019 21:02   [name] Contrast  Result Date: 02/28/2019 CLINICAL DATA:  Initial evaluation for acute  right leg pain. EXAM: MRI HEAD WITHOUT CONTRAST TECHNIQUE: Multiplanar, multiecho pulse sequences of the brain and surrounding structures were obtained without intravenous contrast. COMPARISON:  None available. FINDINGS: Brain: Examination technically limited as the patient was unable to tolerate the full length of the exam. Axial and coronal DWI sequence, with axial T2 and FLAIR sequences, and sagittal T1 weighted sequence only were performed. Additionally, images provided are degraded by motion artifact. Cerebral volume within normal limits for age. No significant cerebral white matter changes identified on this limited exam. 13 mm focus of restricted diffusion seen involving the posterior limb of the left internal capsule, compatible with acute ischemic infarct (series 4, image 25). No associated mass effect or definite hemorrhage on this limited exam. There is an additional possible punctate focus of diffusion abnormality involving the posterior limb of the contralateral right internal capsule (series 4, image 25), which could reflect an additional tiny acute to subacute ischemic infarct. No other evidence for acute or subacute ischemia. Gray-white matter differentiation otherwise maintained. No encephalomalacia seen elsewhere to suggest chronic  cortical infarction. No mass lesion, midline shift or mass effect. Ventricles normal size without hydrocephalus. No extra-axial fluid collection. Vascular: Major intracranial vascular flow voids maintained. Skull and upper cervical spine: Craniocervical junction within normal limits. Scalp soft tissues and calvarium within normal limits. Sinuses/Orbits: Globes and orbital soft tissues demonstrate no acute finding. Mild scattered mucosal thickening throughout the ethmoidal air cells and maxillary sinuses. Trace layering fluid noted within the right maxillary sinus. Paranasal sinuses are otherwise clear. No mastoid effusion. Inner ear structures grossly normal. Other: None. IMPRESSION: 1. Technically limited exam due to the patient's inability to tolerate the full length of the study as well as motion artifact. 2. 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 3. Additional possible punctate acute to early subacute ischemic infarct involving the posterior limb of the contralateral right internal capsule. Electronically Signed   By: Jeannine Boga M.D.   On: 02/28/2019 22:23   [name] Contrast  Result Date: 02/28/2019 CLINICAL DATA:  Initial evaluation for acute right lower extremity pain, no injury. EXAM: MRI LUMBAR SPINE WITHOUT CONTRAST TECHNIQUE: Multiplanar, multisequence MR imaging of the lumbar spine was performed. No intravenous contrast was administered. COMPARISON:  None available. FINDINGS: Segmentation: Standard. Lowest well-formed disc labeled the L5-S1 level. Alignment: Vertebral bodies normally aligned with preservation of the normal lumbar lordosis. No listhesis. Vertebrae: Vertebral body height well maintained without evidence for acute or chronic fracture. Bone marrow signal intensity within normal limits. Mild reactive endplate changes present about the L5-S1 interspace. Reactive marrow edema present about the L4-5 facets bilaterally due  to facet arthritis, right greater than left. No other abnormal marrow edema. No discrete or worrisome osseous lesions. Conus medullaris and cauda equina: Conus extends to the L2 level. Conus and cauda equina appear normal. Paraspinal and other soft tissues: Paraspinous soft tissues within normal limits. Visualized visceral structures unremarkable. 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 L1-2: Negative interspace. Mild bilateral facet hypertrophy. No canal or foraminal stenosis. L2-3: Negative interspace. Mild bilateral facet hypertrophy, slightly worse on the right. No significant canal or foraminal stenosis. No impingement. L3-4: Negative interspace. Moderate right with mild left facet hypertrophy. No significant spinal stenosis. Foramina remain patent. No impingement. L4-5: Mild annular disc bulge with disc desiccation. Moderate facet and ligament flavum hypertrophy. Associated reactive marrow edema due to facet arthritis, right greater than left. No significant spinal stenosis.  Mild bilateral L4 foraminal narrowing without impingement. L5-S1: Mild diffuse disc bulge with disc desiccation. Disc bulging slightly asymmetric to the left. Associated reactive endplate changes with marginal endplate osteophytic spurring. Mild to moderate facet hypertrophy, slightly worse on the left. Central canal remains patent. Moderate bilateral L5 foraminal stenosis, left slightly worse than right. IMPRESSION: 1. Mild degenerative disc bulging with facet hypertrophy at L5-S1 with resultant moderate bilateral L5 foraminal stenosis. Either of the exiting L5 nerve roots could be affected. 2. Disc bulging with moderate facet hypertrophy at L4-5 with resultant mild bilateral L4 foraminal stenosis. No frank impingement. 3. Mild-to-moderate multilevel facet hypertrophy throughout the lumbar spine, most pronounced at L4-5 where there is associated reactive marrow edema. Finding could contribute to underlying  low back pain. Electronically Signed   By: Jeannine Boga M.D.   On: 02/28/2019 22:11   Dg Knee Complete 4 Views Right  Result Date: 02/28/2019 CLINICAL DATA:  Right leg pain and swelling, onset yesterday. EXAM: RIGHT KNEE - COMPLETE 4+ VIEW COMPARISON:  None. FINDINGS: Tricompartmental osteoarthritis with peripheral spurring. Mild medial tibiofemoral joint space narrowing. No significant joint effusion. No fracture, dislocation, or bony destructive change. No focal soft tissue abnormality. 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 Electronically Signed   By: Keith Rake M.D.   On: 02/28/2019 21:01    Procedures Procedures (including critical care time)  Medications Ordered in ED Medications   stroke: mapping our early stages of recovery book (has no administration in time range)  acetaminophen (TYLENOL) tablet 650 mg (has no administration in time range)    Or  acetaminophen (TYLENOL) solution 650 mg (has no administration in time range)    Or  acetaminophen (TYLENOL) suppository 650 mg (has no administration in time range)  aspirin suppository 300 mg (has no administration in time range)    Or  aspirin tablet 325 mg (has no administration in time range)     Initial Impression / Assessment and Plan / ED Course  I have reviewed the triage vital signs and the nursing notes.  Pertinent labs & imaging results that were available during my care of the patient were reviewed by me and considered in my medical decision making (see chart for details).       Casey Petty is a 58 y.o. male who presents to ED for right leg weakness which began yesterday. Denies known injury or inciting event. Does have right leg weakness on exam as well as unsteady gait.  MRI shows 13 mm acute ischemic infarct involving the posterior limb left internal capsule.  Also shows additional possible punctate acute or early subacute ischemic infarct involving the  posterior limb of the contralateral right internal capsule.  Symptoms began greater than 24 hours ago.  Discussed with neurology, Dr. Leonel Ramsay, who recommends admission to [facility] for further stroke work-up.  Hospitalist consulted who will admit.  Patient seen by and discussed with Dr. Stark Jock who agrees with treatment plan.   Final Clinical Impressions(s) / ED Diagnoses   Final diagnoses:  Cerebrovascular accident (CVA), unspecified mechanism [facility])    ED Discharge Orders    None       Dwaine Pringle, Ozella Almond, PA-C 02/28/19 2352    Veryl Speak, MD 03/05/19 [id number]

## 2019-02-28 NOTE — ED Triage Notes (Addendum)
Pt arrived via EMS with complaints of right leg pain the started yesterday after work. Pt has no updated medical history after stating not seeing a provider in 20 years. BP elevated with EMS, pt stated ETOH on board.

## 2019-02-28 NOTE — H&P (Signed)
TRH H&P    Patient Demographics:    Casey Petty, is a 58 y.o. male  MRN: [id number]  DOB - 07-13-1961  Admit Date - 02/28/2019  Referring MD/NP/PA: Roselyn Reef Ward  Outpatient Primary MD for the patient is Patient, [name]  Patient coming from:  home  Chief complaint-  Right leg weakness   HPI:    Casey Petty  is a 58 y.o. male,  w hypertension, presents with c/o right leg pain/weakness starting 5pm Wednesday. Pt denies numbness, tingling.   Pt denies headache, vision change, slurred speech, cp, palp, sob, n/v, abd pain, diarrhea, brbpr, dysuria, hematuria.  Pt is not currently on bp medication. He lives with his wife.    In ED,  T 98.4, P 88 R 20  Bp 188/104, pox 99% on RA Wt 74.8 kg  MRI brain IMPRESSION: 1. Technically limited exam due to the patient's inability to tolerate the full length of the study as well as motion artifact. 2. 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 3. Additional possible punctate acute to early subacute ischemic infarct involving the posterior limb of the contralateral right internal capsule.  MRI Lumbar spine IMPRESSION: 1. Mild degenerative disc bulging with facet hypertrophy at L5-S1 with resultant moderate bilateral L5 foraminal stenosis. Either of the exiting L5 nerve roots could be affected. 2. Disc bulging with moderate facet hypertrophy at L4-5 with resultant mild bilateral L4 foraminal stenosis. No frank impingement. 3. Mild-to-moderate multilevel facet hypertrophy throughout the lumbar spine, most pronounced at L4-5 where there is associated reactive marrow edema. Finding could contribute to underlying low back pain.  ED spoke with neurology and will consult on this case  Pt will be admitted for w/up of stroke.      Review of systems:    In addition to the HPI above,  No Fever-chills, No  Headache, No changes with Vision or hearing, No problems swallowing food or Liquids, No Chest pain, Cough or Shortness of Breath, No Abdominal pain, No Nausea or Vomiting, bowel movements are regular, No Blood in stool or Urine, No dysuria, No new skin rashes or bruises, No new joints pains-aches,   No recent weight gain or loss, No polyuria, polydypsia or polyphagia, No significant Mental Stressors.  All other systems reviewed and are negative.    Past History of the following :    Past Medical History:  Diagnosis Date   Hypertension       History reviewed. No pertinent surgical history. No surgeries per pt   Social History:      Social History   Tobacco Use   Smoking status: Current Every Day Smoker   Smokeless tobacco: Never Used   Tobacco comment: Smoking 4 cigs/day  Substance Use Topics   Alcohol use: Yes    Alcohol/week: 2.0 standard drinks    Types: 2 Standard drinks or equivalent per week    Pt states can go without drinking without any problems   Family History :     Family History  Problem Relation Age of Onset   Heart attack Father        Home Medications:   Prior to Admission medications   Medication Sig Start Date End Date Taking? Authorizing Provider  amLODipine (NORVASC) 10 MG tablet Take 1 tablet (10 mg total) by mouth daily. Patient not taking: Reported on 02/28/2019 07/24/15   Clayton Bibles, PA-C  methocarbamol (ROBAXIN) 500 MG tablet Take 1 tablet (500 mg total) by mouth 3 (three) times daily between meals as needed. Patient not taking: Reported on 02/28/2019 12/22/16   Tanna Furry, MD  naproxen (NAPROSYN) 500 MG tablet Take 1 tablet (500 mg total) by mouth 2 (two) times daily. Patient not taking: Reported on 02/28/2019 12/22/16   Tanna Furry, MD  traMADol (ULTRAM) 50 MG tablet Take 1 tablet (50 mg total) by mouth every 6 (six) hours as needed. Patient not taking: Reported on 02/28/2019 12/22/16   Tanna Furry, MD     Allergies:     No Known Allergies   Physical Exam:   Vitals  Blood pressure (!) 195/115, pulse 71, temperature 97.9 F (36.6 C), temperature source Oral, resp. rate 20, height 5\' 7"  (1.702 m), weight 74.8 kg, SpO2 97 %.  1.  General: axoxo3  2. Psychiatric: euthymic  3. Neurologic: cn2-12 intact, reflexes 2+ symmetric, diffuse with no clonus, downgoing toes bilaterally, motor 5/5 in all 4 ext,  Possibly 5-/5 grip and 5-/5 prox and distal right lower ext strength  4. HEENMT:  Anicteric, pupils 1.23mm symmetric, direct, consensual, near intact eomi Tongue midline Neck: no jvd, no bruit  5. Respiratory : CTAB  6. Cardiovascular : rrr s1, s2, no m/g/r  7. Gastrointestinal:  Abd: soft, nt, nd, +bs  8. Skin:  Ext: no c/c/e,  No rash  9.Musculoskeletal:  Good ROM,    No adenopathy    Data Review:    CBC Recent Labs  Lab 02/28/19 2223  WBC 13.0*  HGB 15.9  HCT 47.8  PLT 188  MCV 94.3  MCH 31.4  MCHC 33.3  RDW 13.5  LYMPHSABS 4.2*  MONOABS 1.0  EOSABS 0.6*  BASOSABS 0.1   ------------------------------------------------------------------------------------------------------------------  Results for orders placed or performed during the hospital encounter of 02/28/19 (from the past 48 hour(s))  CBC with Differential     Status: Abnormal   Collection Time: 02/28/19 10:23 PM  Result Value Ref Range   WBC 13.0 (H) 4.0 - 10.5 K/uL   RBC 5.07 4.22 - 5.81 MIL/uL   Hemoglobin 15.9 13.0 - 17.0 g/dL   HCT 47.8 39.0 - 52.0 %   MCV 94.3 80.0 - 100.0 fL   MCH 31.4 26.0 - 34.0 pg   MCHC 33.3 30.0 - 36.0 g/dL   RDW 13.5 11.5 - 15.5 %   Platelets 188 150 - 400 K/uL   nRBC 0.0 0.0 - 0.2 %   Neutrophils Relative % 53 %   Neutro Abs 7.1 1.7 - 7.7 K/uL   Lymphocytes Relative 32 %   Lymphs Abs 4.2 (H) 0.7 - 4.0 K/uL   Monocytes Relative 8 %   Monocytes Absolute 1.0 0.1 - 1.0 K/uL   Eosinophils Relative 5 %   Eosinophils Absolute 0.6 (H) 0.0 - 0.5 K/uL   Basophils Relative 1 %    Basophils Absolute 0.1 0.0 - 0.1 K/uL   Immature Granulocytes 1 %   Abs Immature Granulocytes 0.06 0.00 - 0.07 K/uL    Comment: Performed at [facility], [place]  [address], [place], [place] [address]  Basic metabolic panel     Status: Abnormal   Collection Time: 02/28/19 10:23 PM  Result Value Ref Range   Sodium 138 135 - 145 mmol/L   Potassium 3.5 3.5 - 5.1 mmol/L   Chloride 104 98 - 111 mmol/L   CO2 24 22 - 32 mmol/L   Glucose, Bld 112 (H) 70 - 99 mg/dL   BUN 9 6 - 20 mg/dL   Creatinine, Ser 0.91 0.61 - 1.24 mg/dL   Calcium 9.5 8.9 - 10.3 mg/dL   GFR calc non Af Amer >60 >60 mL/min   GFR calc Af Amer >60 >60 mL/min   Anion gap 10 5 - 15    Comment: Performed at [facility], [place] [address] [place], [place] [address]  Ethanol     Status: None   Collection Time: 02/28/19 10:23 PM  Result Value Ref Range   Alcohol, Ethyl (B) <10 <10 mg/dL    Comment: (NOTE) Lowest detectable limit for serum alcohol is 10 mg/dL. For medical purposes only. Performed at [facility] [place] [address] [place], [place] [address]   SARS Coronavirus 2 (CEPHEID - Performed in [place] hospital lab), Hosp Order     Status: None   Collection Time: 02/28/19 10:50 PM   Specimen: Nasopharyngeal Swab  Result Value Ref Range   SARS Coronavirus 2 NEGATIVE NEGATIVE    Comment: (NOTE) If result is NEGATIVE SARS-CoV-2 target nucleic acids are NOT DETECTED. The SARS-CoV-2 RNA is generally detectable in upper and lower  respiratory specimens during the acute phase of infection. The lowest  concentration of SARS-CoV-2 viral copies this assay can detect is 250  copies / mL. A negative result does not preclude SARS-CoV-2 infection  and should not be used as the sole basis for treatment or other  patient management decisions.  A negative result may occur with  improper specimen collection / handling, submission of specimen other  than nasopharyngeal swab,  presence of viral mutation(s) within the  areas targeted by this assay, and inadequate number of viral copies  (<250 copies / mL). A negative result must be combined with clinical  observations, patient history, and epidemiological information. If result is POSITIVE SARS-CoV-2 target nucleic acids are DETECTED. The SARS-CoV-2 RNA is generally detectable in upper and lower  respiratory specimens dur ing the acute phase of infection.  Positive  results are indicative of active infection with SARS-CoV-2.  Clinical  correlation with patient history and other diagnostic information is  necessary to determine patient infection status.  Positive results do  not rule out bacterial infection or co-infection with other viruses. If result is PRESUMPTIVE POSTIVE SARS-CoV-2 nucleic acids MAY BE PRESENT.   A presumptive positive result was obtained on the submitted specimen  and confirmed on repeat testing.  While 2019 novel coronavirus  (SARS-CoV-2) nucleic acids may be present in the submitted sample  additional confirmatory testing may be necessary for epidemiological  and / or clinical management purposes  to differentiate between  SARS-CoV-2 and other Sarbecovirus currently known to infect humans.  If clinically indicated additional testing with an alternate test  methodology [phone]) is advised. The SARS-CoV-2 RNA is generally  detectable in upper and lower respiratory sp ecimens during the acute  phase of infection. The expected result is Negative. Fact Sheet for Patients:  StrictlyIdeas.no Fact Sheet for Healthcare Providers: BankingDealers.co.za This test is not yet approved or cleared by the [place] FDA and has been authorized for detection and/or diagnosis of  SARS-CoV-2 by FDA under an Emergency Use Authorization (EUA).  This EUA will remain in effect (meaning this test can be used) for the duration of the COVID-19 declaration under  Section 564(b)(1) of the Act, 21 U.S.C. section 360bbb-3(b)(1), unless the authorization is terminated or revoked sooner. Performed at [facility], [place] [address], [place], [place] [address]     Chemistries  Recent Labs  Lab 02/28/19 2223  NA 138  K 3.5  CL 104  CO2 24  GLUCOSE 112*  BUN 9  CREATININE 0.91  CALCIUM 9.5   ------------------------------------------------------------------------------------------------------------------  ------------------------------------------------------------------------------------------------------------------ GFR: Estimated Creatinine Clearance: 85.8 mL/min (by C-G formula based on SCr of 0.91 mg/dL). Liver Function Tests: No results for input(s): AST, ALT, ALKPHOS, BILITOT, PROT, ALBUMIN in the last 168 hours. No results for input(s): LIPASE, AMYLASE in the last 168 hours. No results for input(s): AMMONIA in the last 168 hours. Coagulation Profile: No results for input(s): INR, PROTIME in the last 168 hours. Cardiac Enzymes: No results for input(s): CKTOTAL, CKMB, CKMBINDEX, TROPONINI in the last 168 hours. BNP (last 3 results) No results for input(s): PROBNP in the last 8760 hours. HbA1C: No results for input(s): HGBA1C in the last 72 hours. CBG: No results for input(s): GLUCAP in the last 168 hours. Lipid Profile: No results for input(s): CHOL, HDL, LDLCALC, TRIG, CHOLHDL, LDLDIRECT in the last 72 hours. Thyroid Function Tests: No results for input(s): TSH, T4TOTAL, FREET4, T3FREE, THYROIDAB in the last 72 hours. Anemia Panel: No results for input(s): VITAMINB12, FOLATE, FERRITIN, TIBC, IRON, RETICCTPCT in the last 72 hours.  --------------------------------------------------------------------------------------------------------------- Urine analysis: No results found for: COLORURINE, APPEARANCEUR, LABSPEC, PHURINE, GLUCOSEU, HGBUR, BILIRUBINUR, KETONESUR, PROTEINUR, UROBILINOGEN, NITRITE,  LEUKOCYTESUR    Imaging Results:    Dg Chest 2 View  Result Date: 03/01/2019 CLINICAL DATA:  Right leg weakness. Shortness of breath. EXAM: CHEST - 2 VIEW COMPARISON:  07/24/2015 FINDINGS: The cardiomediastinal contours are normal. The lungs are clear. Pulmonary vasculature is normal. No consolidation, pleural effusion, or pneumothorax. No acute osseous abnormalities are seen. IMPRESSION: No acute chest findings. Electronically Signed   By: Keith Rake M.D.   On: 03/01/2019 00:17   Dg Tibia/fibula Right  Result Date: 02/28/2019 CLINICAL DATA:  Right leg pain and swelling, onset yesterday. EXAM: RIGHT TIBIA AND FIBULA - 2 VIEW COMPARISON:  Concurrent knee radiographs. FINDINGS: Cortical margins of the tibia and fibular intact. Proximal aspect of the lower extremity included on concurrent knee exam. There is no evidence of fracture or other focal bone lesions. Soft tissues are unremarkable. IMPRESSION: Negative radiographs of the right lower extremity. Electronically Signed   By: Keith Rake M.D.   On: 02/28/2019 21:02   [name] Contrast  Result Date: 02/28/2019 CLINICAL DATA:  Initial evaluation for acute right leg pain. EXAM: MRI HEAD WITHOUT CONTRAST TECHNIQUE: Multiplanar, multiecho pulse sequences of the brain and surrounding structures were obtained without intravenous contrast. COMPARISON:  None available. FINDINGS: Brain: Examination technically limited as the patient was unable to tolerate the full length of the exam. Axial and coronal DWI sequence, with axial T2 and FLAIR sequences, and sagittal T1 weighted sequence only were performed. Additionally, images provided are degraded by motion artifact. Cerebral volume within normal limits for age. No significant cerebral white matter changes identified on this limited exam. 13 mm focus of restricted diffusion seen involving the posterior limb of the left internal capsule, compatible with acute ischemic infarct (series 4, image 25).  No associated mass effect or definite hemorrhage  on this limited exam. There is an additional possible punctate focus of diffusion abnormality involving the posterior limb of the contralateral right internal capsule (series 4, image 25), which could reflect an additional tiny acute to subacute ischemic infarct. No other evidence for acute or subacute ischemia. Gray-white matter differentiation otherwise maintained. No encephalomalacia seen elsewhere to suggest chronic cortical infarction. No mass lesion, midline shift or mass effect. Ventricles normal size without hydrocephalus. No extra-axial fluid collection. Vascular: Major intracranial vascular flow voids maintained. Skull and upper cervical spine: Craniocervical junction within normal limits. Scalp soft tissues and calvarium within normal limits. Sinuses/Orbits: Globes and orbital soft tissues demonstrate no acute finding. Mild scattered mucosal thickening throughout the ethmoidal air cells and maxillary sinuses. Trace layering fluid noted within the right maxillary sinus. Paranasal sinuses are otherwise clear. No mastoid effusion. Inner ear structures grossly normal. Other: None. IMPRESSION: 1. Technically limited exam due to the patient's inability to tolerate the full length of the study as well as motion artifact. 2. 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 3. Additional possible punctate acute to early subacute ischemic infarct involving the posterior limb of the contralateral right internal capsule. Electronically Signed   By: Jeannine Boga M.D.   On: 02/28/2019 22:23   [name] Contrast  Result Date: 02/28/2019 CLINICAL DATA:  Initial evaluation for acute right lower extremity pain, no injury. EXAM: MRI LUMBAR SPINE WITHOUT CONTRAST TECHNIQUE: Multiplanar, multisequence MR imaging of the lumbar spine was performed. No intravenous contrast was administered. COMPARISON:  None available.  FINDINGS: Segmentation: Standard. Lowest well-formed disc labeled the L5-S1 level. Alignment: Vertebral bodies normally aligned with preservation of the normal lumbar lordosis. No listhesis. Vertebrae: Vertebral body height well maintained without evidence for acute or chronic fracture. Bone marrow signal intensity within normal limits. Mild reactive endplate changes present about the L5-S1 interspace. Reactive marrow edema present about the L4-5 facets bilaterally due to facet arthritis, right greater than left. No other abnormal marrow edema. No discrete or worrisome osseous lesions. Conus medullaris and cauda equina: Conus extends to the L2 level. Conus and cauda equina appear normal. Paraspinal and other soft tissues: Paraspinous soft tissues within normal limits. Visualized visceral structures unremarkable. 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 L1-2: Negative interspace. Mild bilateral facet hypertrophy. No canal or foraminal stenosis. L2-3: Negative interspace. Mild bilateral facet hypertrophy, slightly worse on the right. No significant canal or foraminal stenosis. No impingement. L3-4: Negative interspace. Moderate right with mild left facet hypertrophy. No significant spinal stenosis. Foramina remain patent. No impingement. L4-5: Mild annular disc bulge with disc desiccation. Moderate facet and ligament flavum hypertrophy. Associated reactive marrow edema due to facet arthritis, right greater than left. No significant spinal stenosis. Mild bilateral L4 foraminal narrowing without impingement. L5-S1: Mild diffuse disc bulge with disc desiccation. Disc bulging slightly asymmetric to the left. Associated reactive endplate changes with marginal endplate osteophytic spurring. Mild to moderate facet hypertrophy, slightly worse on the left. Central canal remains patent. Moderate bilateral L5 foraminal stenosis, left slightly worse than right. IMPRESSION: 1. Mild degenerative disc  bulging with facet hypertrophy at L5-S1 with resultant moderate bilateral L5 foraminal stenosis. Either of the exiting L5 nerve roots could be affected. 2. Disc bulging with moderate facet hypertrophy at L4-5 with resultant mild bilateral L4 foraminal stenosis. No frank impingement. 3. Mild-to-moderate multilevel facet hypertrophy throughout the lumbar spine, most pronounced at L4-5 where there is associated reactive marrow edema. Finding  could contribute to underlying low back pain. Electronically Signed   By: Jeannine Boga M.D.   On: 02/28/2019 22:11   Dg Knee Complete 4 Views Right  Result Date: 02/28/2019 CLINICAL DATA:  Right leg pain and swelling, onset yesterday. EXAM: RIGHT KNEE - COMPLETE 4+ VIEW COMPARISON:  None. FINDINGS: Tricompartmental osteoarthritis with peripheral spurring. Mild medial tibiofemoral joint space narrowing. No significant joint effusion. No fracture, dislocation, or bony destructive change. No focal soft tissue abnormality. 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 Electronically Signed   By: Keith Rake M.D.   On: 02/28/2019 21:01       Assessment & Plan:    Principal Problem:   Stroke [facility]) Active Problems:   Essential hypertension   Tobacco abuse   Hyperglycemia  Stroke Tele Check UDS Check hga1c, lipid Carotid ultrasound  Cardiac echo PT/OT/speech therapy consult Aspirin, Lipitor 80mg  po qhs  Hypertension uncontrolled Permissive hypertension for now Will start Amlodipine 10mg  po qday in am (previously on this medication?) Hydralazine 5mg  iv q6h prn sbp >225  Hyperglycemia Check hga1c as above, if >6.5 please start fsbs ac and qhs, ISS  Tobacco use Nicotine patch 21mg  topically qday prn  Pt counselled on cessation x 38minutes    DVT Prophylaxis-   Lovenox - SCDs   AM Labs Ordered, also please review Full Orders  Family Communication: Admission, patients condition and plan of care including  tests being ordered have been discussed with the patient  who indicate understanding and agree with the plan and Code Status.  Code Status:  FULL CODE, notified sister that pt admitted to [facility]   Admission status: Observation : Based on patients clinical presentation and evaluation of above clinical data, I have made determination that patient meets observation criteria at this time.   Time spent in minutes : 70 minutes    Jani Gravel M.D on 03/01/2019 at 12:36 AM

## 2019-03-01 ENCOUNTER — Observation Stay (HOSPITAL_COMMUNITY): Payer: Medicaid Other

## 2019-03-01 ENCOUNTER — Inpatient Hospital Stay (HOSPITAL_COMMUNITY): Payer: Medicaid Other

## 2019-03-01 DIAGNOSIS — D72828 Other elevated white blood cell count: Secondary | ICD-10-CM | POA: Diagnosis not present

## 2019-03-01 DIAGNOSIS — D72829 Elevated white blood cell count, unspecified: Secondary | ICD-10-CM | POA: Diagnosis not present

## 2019-03-01 DIAGNOSIS — M1711 Unilateral primary osteoarthritis, right knee: Secondary | ICD-10-CM | POA: Diagnosis not present

## 2019-03-01 DIAGNOSIS — M79661 Pain in right lower leg: Secondary | ICD-10-CM | POA: Diagnosis not present

## 2019-03-01 DIAGNOSIS — Z9114 Patient's other noncompliance with medication regimen: Secondary | ICD-10-CM | POA: Diagnosis not present

## 2019-03-01 DIAGNOSIS — T464X5A Adverse effect of angiotensin-converting-enzyme inhibitors, initial encounter: Secondary | ICD-10-CM | POA: Diagnosis not present

## 2019-03-01 DIAGNOSIS — R7309 Other abnormal glucose: Secondary | ICD-10-CM | POA: Diagnosis not present

## 2019-03-01 DIAGNOSIS — I6381 Other cerebral infarction due to occlusion or stenosis of small artery: Secondary | ICD-10-CM | POA: Diagnosis not present

## 2019-03-01 DIAGNOSIS — R471 Dysarthria and anarthria: Secondary | ICD-10-CM | POA: Diagnosis not present

## 2019-03-01 DIAGNOSIS — R739 Hyperglycemia, unspecified: Secondary | ICD-10-CM | POA: Diagnosis present

## 2019-03-01 DIAGNOSIS — E1165 Type 2 diabetes mellitus with hyperglycemia: Secondary | ICD-10-CM | POA: Diagnosis not present

## 2019-03-01 DIAGNOSIS — G8110 Spastic hemiplegia affecting unspecified side: Secondary | ICD-10-CM | POA: Diagnosis not present

## 2019-03-01 DIAGNOSIS — E119 Type 2 diabetes mellitus without complications: Secondary | ICD-10-CM | POA: Diagnosis not present

## 2019-03-01 DIAGNOSIS — R0989 Other specified symptoms and signs involving the circulatory and respiratory systems: Secondary | ICD-10-CM | POA: Diagnosis not present

## 2019-03-01 DIAGNOSIS — I639 Cerebral infarction, unspecified: Secondary | ICD-10-CM

## 2019-03-01 DIAGNOSIS — I160 Hypertensive urgency: Secondary | ICD-10-CM | POA: Diagnosis not present

## 2019-03-01 DIAGNOSIS — T783XXA Angioneurotic edema, initial encounter: Secondary | ICD-10-CM | POA: Diagnosis not present

## 2019-03-01 DIAGNOSIS — R29703 NIHSS score 3: Secondary | ICD-10-CM | POA: Diagnosis not present

## 2019-03-01 DIAGNOSIS — R29898 Other symptoms and signs involving the musculoskeletal system: Secondary | ICD-10-CM | POA: Diagnosis present

## 2019-03-01 DIAGNOSIS — I10 Essential (primary) hypertension: Secondary | ICD-10-CM | POA: Diagnosis not present

## 2019-03-01 DIAGNOSIS — M7989 Other specified soft tissue disorders: Secondary | ICD-10-CM | POA: Diagnosis not present

## 2019-03-01 DIAGNOSIS — Z716 Tobacco abuse counseling: Secondary | ICD-10-CM | POA: Diagnosis not present

## 2019-03-01 DIAGNOSIS — G8191 Hemiplegia, unspecified affecting right dominant side: Secondary | ICD-10-CM | POA: Diagnosis present

## 2019-03-01 DIAGNOSIS — I119 Hypertensive heart disease without heart failure: Secondary | ICD-10-CM | POA: Diagnosis not present

## 2019-03-01 DIAGNOSIS — Z8249 Family history of ischemic heart disease and other diseases of the circulatory system: Secondary | ICD-10-CM | POA: Diagnosis not present

## 2019-03-01 DIAGNOSIS — F17210 Nicotine dependence, cigarettes, uncomplicated: Secondary | ICD-10-CM | POA: Diagnosis not present

## 2019-03-01 DIAGNOSIS — M5126 Other intervertebral disc displacement, lumbar region: Secondary | ICD-10-CM | POA: Diagnosis not present

## 2019-03-01 DIAGNOSIS — R29810 Facial weakness: Secondary | ICD-10-CM | POA: Diagnosis not present

## 2019-03-01 DIAGNOSIS — R4587 Impulsiveness: Secondary | ICD-10-CM | POA: Diagnosis not present

## 2019-03-01 DIAGNOSIS — Z20828 Contact with and (suspected) exposure to other viral communicable diseases: Secondary | ICD-10-CM | POA: Diagnosis not present

## 2019-03-01 DIAGNOSIS — I6300 Cerebral infarction due to thrombosis of unspecified precerebral artery: Secondary | ICD-10-CM

## 2019-03-01 DIAGNOSIS — Z79899 Other long term (current) drug therapy: Secondary | ICD-10-CM | POA: Diagnosis not present

## 2019-03-01 DIAGNOSIS — E785 Hyperlipidemia, unspecified: Secondary | ICD-10-CM | POA: Diagnosis not present

## 2019-03-01 DIAGNOSIS — Z9119 Patient's noncompliance with other medical treatment and regimen: Secondary | ICD-10-CM | POA: Diagnosis not present

## 2019-03-01 DIAGNOSIS — T380X5A Adverse effect of glucocorticoids and synthetic analogues, initial encounter: Secondary | ICD-10-CM | POA: Diagnosis not present

## 2019-03-01 LAB — RAPID URINE DRUG SCREEN, HOSP PERFORMED
Amphetamines: NOT DETECTED
Barbiturates: NOT DETECTED
Benzodiazepines: NOT DETECTED
Cocaine: NOT DETECTED
Opiates: NOT DETECTED
Tetrahydrocannabinol: NOT DETECTED

## 2019-03-01 LAB — GLUCOSE, CAPILLARY: Glucose-Capillary: 145 mg/dL — ABNORMAL HIGH (ref 70–99)

## 2019-03-01 LAB — ECHOCARDIOGRAM COMPLETE
Height: 67 in
Weight: 2620.83 [oz_av]

## 2019-03-01 LAB — SARS CORONAVIRUS 2 BY RT PCR (HOSPITAL ORDER, PERFORMED IN ~~LOC~~ HOSPITAL LAB): SARS Coronavirus 2: NEGATIVE

## 2019-03-01 MED ORDER — ASPIRIN 81 MG PO CHEW
324.0000 mg | CHEWABLE_TABLET | Freq: Once | ORAL | Status: AC
  Administered 2019-03-01: 324 mg via ORAL
  Filled 2019-03-01: qty 4

## 2019-03-01 MED ORDER — CLOPIDOGREL BISULFATE 75 MG PO TABS
300.0000 mg | ORAL_TABLET | Freq: Once | ORAL | Status: AC
  Administered 2019-03-01: 300 mg via ORAL
  Filled 2019-03-01: qty 4

## 2019-03-01 MED ORDER — AMLODIPINE BESYLATE 10 MG PO TABS
10.0000 mg | ORAL_TABLET | Freq: Every day | ORAL | Status: DC
  Administered 2019-03-01 – 2019-03-04 (×4): 10 mg via ORAL
  Filled 2019-03-01 (×4): qty 1

## 2019-03-01 MED ORDER — HYDRALAZINE HCL 20 MG/ML IJ SOLN: 5.0000 mg | Freq: Four times a day (QID) | INTRAMUSCULAR | Status: DC | PRN

## 2019-03-01 MED ORDER — ATORVASTATIN CALCIUM 40 MG PO TABS
80.0000 mg | ORAL_TABLET | Freq: Every day | ORAL | Status: DC
  Administered 2019-03-01 – 2019-03-03 (×3): 80 mg via ORAL
  Filled 2019-03-01 (×3): qty 2

## 2019-03-01 MED ORDER — CLOPIDOGREL BISULFATE 75 MG PO TABS
75.0000 mg | ORAL_TABLET | Freq: Every day | ORAL | Status: DC
  Administered 2019-03-02 – 2019-03-04 (×3): 75 mg via ORAL
  Filled 2019-03-01 (×3): qty 1

## 2019-03-01 MED ORDER — ASPIRIN EC 81 MG PO TBEC
81.0000 mg | DELAYED_RELEASE_TABLET | Freq: Every day | ORAL | Status: DC
  Administered 2019-03-02 – 2019-03-04 (×3): 81 mg via ORAL
  Filled 2019-03-01 (×3): qty 1

## 2019-03-01 NOTE — Progress Notes (Signed)
TCD       has been completed. Preliminary results can be found under CV proc through chart review. June Leap, BS, RDMS, RVT

## 2019-03-01 NOTE — Progress Notes (Signed)
STROKE TEAM PROGRESS NOTE   HISTORY OF PRESENT ILLNESS (per record) Casey Petty is a 58 y.o. male with a history of hypertension who presents with right-sided weakness that started around 5 PM on Wednesday.  He states that he has been having trouble walking.  He denies numbness, vision change.  He states that it does seem to be getting worse. He denies any similar events prior.  He has a history of hypertension, but does not take any blood thinners at home, just amlodipine.  Due to the symptoms not improving, he presented to the emergency department at [facility] long where he was noted to have right leg weakness.  An MRI L-Petty as well as brain were performed which demonstrates small posterior limb of internal capsule stroke.  LKW: 5 PM Wednesday tpa given?: no, outside of window   SUBJECTIVE (INTERVAL HISTORY) I have personally reviewed history of presenting illness with the patient.  He still has some right facial asymmetry.Patient states he is doing well.  MRI scan confirms left internal capsule acute lacunar infarct.  Carotid ultrasound showed no significant extracranial stenosis.  Urine drug screen is negative.  Echocardiogram is pending.   OBJECTIVE Vitals:   03/01/19 0326 03/01/19 0520 03/01/19 0651 03/01/19 0700  BP: (!) 187/87 (!) 181/96 (!) 177/94 (!) 185/87  Pulse:  81 70 75  Resp:   18 18  Temp: 98.4 F (36.9 C) 98.4 F (36.9 C) 98.4 F (36.9 C) 98.2 F (36.8 C)  TempSrc: Oral Oral Oral Oral  SpO2:  97% 99% 98%  Weight:      Height:        CBC:  Recent Labs  Lab 02/28/19 2223  WBC 13.0*  NEUTROABS 7.1  HGB 15.9  HCT 47.8  MCV 94.3  PLT 782    Basic Metabolic Panel:  Recent Labs  Lab 02/28/19 2223  NA 138  K 3.5  CL 104  CO2 24  GLUCOSE 112*  BUN 9  CREATININE 0.91  CALCIUM 9.5    Lipid Panel:     Component Value Date/Time   CHOL 133 07/18/2014 0922   TRIG 411 (H) 07/18/2014 0922   HDL 38 (L) 07/18/2014 0922   CHOLHDL 3.5 07/18/2014 0922    VLDL NOT CALC 07/18/2014 0922   LDLCALC NOT CALC 07/18/2014 0922   HgbA1c: No results found for: HGBA1C Urine Drug Screen:     Component Value Date/Time   LABOPIA NONE DETECTED 03/01/2019 0707   COCAINSCRNUR NONE DETECTED 03/01/2019 0707   LABBENZ NONE DETECTED 03/01/2019 0707   AMPHETMU NONE DETECTED 03/01/2019 0707   THCU NONE DETECTED 03/01/2019 0707   LABBARB NONE DETECTED 03/01/2019 0707    Alcohol Level     Component Value Date/Time   ETH <10 02/28/2019 2223    IMAGING  Dg Chest 2 View 03/01/2019 IMPRESSION:  No acute chest findings.   Dg Tibia/fibula Right 02/28/2019 IMPRESSION:  Negative radiographs of the right lower extremity.   Casey Petty Contrast 02/28/2019 IMPRESSION:  1. Technically limited exam due to the patient's inability to tolerate the full length of the study as well as motion artifact.  2. 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  3. Additional possible punctate acute to early subacute ischemic infarct involving the posterior limb of the contralateral right internal capsule.   Casey Petty Petty Contrast 02/28/2019 IMPRESSION:  1. Mild degenerative disc bulging with facet hypertrophy at L5-S1 with resultant moderate bilateral L5 foraminal stenosis.  Either of the exiting L5 nerve roots could be affected.  2. Disc bulging with moderate facet hypertrophy at L4-5 with resultant mild bilateral L4 foraminal stenosis. No frank impingement.  3. Mild-to-moderate multilevel facet hypertrophy throughout the lumbar Petty, most pronounced at L4-5 where there is associated reactive marrow edema. Finding could contribute to underlying low back pain.    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   Vas US Carotid (at [place] Only) 03/01/2019 Summary:  Right Carotid: Velocities in the right ICA are consistent with a 1-39% stenosis.   Left Carotid: Velocities in the left ICA are consistent with a 1-39% stenosis.  Vertebrals: Bilateral vertebral arteries demonstrate antegrade flow.   Transthoracic Echocardiogram  00/00/2020 Pending    EKG - SR rate 88 BPM. (See cardiology reading for complete details)    PHYSICAL EXAM Blood pressure (!) 185/87, pulse 75, temperature 98.2 F (36.8 C), temperature source Oral, resp. rate 18, height 5\' 7"  (1.702 m), weight 74.3 kg, SpO2 98 %. Pleasant middle-aged African-American male not in distress. . Afebrile. Head is nontraumatic. Neck is supple without bruit.    Cardiac exam no murmur or gallop. Lungs are clear to auscultation. Distal pulses are well felt. Neurological Exam :  He is awake alert oriented to time place and person.  Mild dysarthria.  No aphasia or apraxia.  Follows commands well.  Extraocular movements are full range without nystagmus.  Blinks to threat bilaterally.  Mild right lower facial weakness.  Tongue midline.  Motor system exam shows mild right hemiparesis 4/5 strength with weakness of right grip and intrinsic hand muscles.  Fine finger movements are diminished on the right.  Orbits left over right upper extremity.  Mild weakness of right hip flexors and ankle dorsiflexors.  Sensation appears preserved bilaterally.  Coordination is slow but accurate on the right and normal on the left.  Gait not tested      ASSESSMENT/PLAN Casey. Cire Petty is a 58 y.o. male with history of hypertension presenting with right sided weakness and gait difficulties. He did not receive IV t-PA due to late presentation (>4.5 hours from time of onset).   Stroke:  infarct involving the posterior limb of the left internal capsule and possible right internal capsule - possibly embolic - unknown source.  Resultant dysarthria and right hemiparesis  CT head  - not ordered  MRI head - 13 mm acute ischemic infarct involving the posterior limb of the left internal capsule. Additional  possible punctate acute to early subacute ischemic infarct involving the posterior limb of the contralateral right internal capsule.   MRA head  - not ordered  CTA H&N - not ordered  Carotid Doppler  - unremarkable  2D Echo - pending  Sars Corona Virus 2  - negative  LDL - not calculated secondary to elevated triglycerides.  HgbA1c - pending  UDS  - negative  VTE prophylaxis - SCDs  Diet  - Heart healthy with thin liquids.  No antithrombotic prior to admission, now on aspirin 325 mg daily and clopidogrel 75 mg daily  Patient counseled to be compliant with his antithrombotic medications  Ongoing aggressive stroke risk factor management  Therapy recommendations:  CIR recommended  Disposition:  Pending  Hypertension  Blood pressure somewhat high at times but within post stroke parameters. . Permissive hypertension (OK if < 220/120) but gradually normalize in 5-7 days . Long-term BP goal normotensive  Hyperlipidemia  Lipid lowering medication PTA:  none  LDL - not calculated secondary to elevated triglycerides, goal < 70  Current lipid lowering medication: Lipitor 80 mg daily  Continue statin at discharge   Other Stroke Risk Factors  Cigarette smoker - advised to stop smoking  ETOH use, advised to drink no more than 1 alcoholic beverage per day.   Other Active Problems  Mild leukocytosis - 13.0 - (afebrile)  Abnormal Casey Petty    Hospital day # 0  I have personally obtained history,examined this patient, reviewed notes, independently viewed imaging studies, participated in medical decision making and plan of care.ROS completed by me personally and pertinent positives fully documented  I have made any additions or clarifications directly to the above note.  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  Recommend aspirin and Plavix for 3 weeks followed by aspirin alone and aggressive risk  factor modification.  Physical occupational therapy and rehab consults.  Long discussion with patient and with Dr. Lonny Prude and answered questions.  Greater than 50% time during this 35-minute visit was spent on counseling and coordination of care about his lacunar stroke and answering questions  Antony Contras, MD Medical Director [place] Pager: [phone] 03/01/2019 2:04 PM   To contact Stroke Continuity provider, please refer to [email or web address] After hours, contact General Neurology

## 2019-03-01 NOTE — ED Notes (Signed)
Called [facility] for transport

## 2019-03-01 NOTE — Evaluation (Signed)
Occupational Therapy Evaluation Patient Details Name: Casey Petty MRN: [id number] DOB: 11/01/1960 Today's Date: 03/01/2019    History of Present Illness Casey Petty  is a 58 y.o. male,  w hypertension, presents with c/o right leg pain/weakness. MRI:13 mm acute ischemic infarct involving the posterior limb of theleft internal capsule.andpossible punctate acute to early subacute ischemicinfarct involving the posterior limb of the contralateral rightinternal capsule.   Clinical Impression   This is 42 male admitted with above presents to acute OT with increased tone RUE and RLE and thus decreased use of both, decreased balance, decreased gaze stability to right all affecting his PLOF of being totally independent with basic ADLs and working full time. He will benefit from acute OT with follow up OT on CIR to get to a Mod I level.     Follow Up Recommendations  CIR;Supervision/Assistance - 24 hour    Equipment Recommendations  Other (comment)(TBD next venue)       Precautions / Restrictions Precautions Precautions: Fall      Mobility Bed Mobility Overal bed mobility: Needs Assistance Bed Mobility: Supine to Sit     Supine to sit: Min guard     General bed mobility comments: pt able to power himself up to EOB using tone on right  in his favor  Transfers Overall transfer level: Needs assistance Equipment used: 2 person hand held assist Transfers: Sit to/from [facility] Sit to Stand: Min assist;+2 physical assistance Stand pivot transfers: Mod assist;+2 physical assistance       General transfer comment: Pt needed A to block his right knee when he stepped with LLE otherwise RLE buckled    Balance Overall balance assessment: Needs assistance Sitting-balance support: No upper extremity supported Sitting balance-Leahy Scale: Fair     Standing balance support: Bilateral upper extremity supported Standing balance-Leahy Scale: Poor                             ADL either performed or assessed with clinical judgement   ADL Overall ADL's : Needs assistance/impaired Eating/Feeding: Set up;Sitting   Grooming: Moderate assistance;Sitting   Upper Body Bathing: Moderate assistance;Sitting   Lower Body Bathing: Moderate assistance Lower Body Bathing Details (indicate cue type and reason): +2 min A sit<>stand Upper Body Dressing : Moderate assistance;Sitting   Lower Body Dressing: Maximal assistance Lower Body Dressing Details (indicate cue type and reason): +2 min A sit<>stand Toilet Transfer: Moderate assistance;+2 for physical assistance;Stand-pivot Toilet Transfer Details (indicate cue type and reason): bed>recliner going to pt's right Toileting- Clothing Manipulation and Hygiene: Maximal assistance Toileting - Clothing Manipulation Details (indicate cue type and reason): +2 min A sit<>stand             Vision Baseline Vision/History: No visual deficits Patient Visual Report: No change from baseline Vision Assessment?: Yes Eye Alignment: Within Functional Limits Ocular Range of Motion: Within Functional Limits Alignment/Gaze Preference: Head tilt(to left) Tracking/Visual Pursuits: Decreased smoothness of eye movement to RIGHT inferior field;Decreased smoothness of eye movement to RIGHT superior field;Other (comment)(diffculty maintaining gaze to right) Visual Fields: No apparent deficits            Pertinent Vitals/Pain Pain Assessment: No/denies pain     Hand Dominance Right   Extremity/Trunk Assessment Upper Extremity Assessment Upper Extremity Assessment: RUE deficits/detail RUE Deficits / Details: increased tone, he can move arm with greatly increased effort and not full ROM RUE Coordination: decreased fine motor;decreased gross motor  Communication Communication Communication: No difficulties   Cognition Arousal/Alertness: Awake/alert Behavior During Therapy: Impulsive Overall Cognitive  Status: No family/caregiver present to determine baseline cognitive functioning                                                Home Living Family/patient expects to be discharged to:: Private residence Living Arrangements: Spouse/significant other Available Help at Discharge: Family;Available 24 hours/day Type of Home: Apartment Home Access: Stairs to enter Entrance Stairs-Number of Steps: 10 Entrance Stairs-Rails: Right(going up) Home Layout: One level     Bathroom Shower/Tub: Tub/shower unit;Curtain   Biochemist, clinical: Standard     Home Equipment: Grab bars - tub/shower      Lives With: Significant other    Prior Functioning/Environment Level of Independence: Independent        Comments: traffic controller on highway        OT Problem List: Decreased strength;Decreased range of motion;Impaired balance (sitting and/or standing);Impaired vision/perception;Decreased coordination;Decreased cognition;Decreased safety awareness;Decreased knowledge of use of DME or AE;Impaired tone;Impaired UE functional use      OT Treatment/Interventions: Self-care/ADL training;Therapeutic exercise;Therapeutic activities;Neuromuscular education;Patient/family education;DME and/or AE instruction;Balance training    OT Goals(Current goals can be found in the care plan section) Acute Rehab OT Goals Patient Stated Goal: did not state OT Goal Formulation: With patient Time For Goal Achievement: 03/15/19 Potential to Achieve Goals: Good  OT Frequency: Min 2X/week           Co-evaluation PT/OT/SLP Co-Evaluation/Treatment: Yes Reason for Co-Treatment: Complexity of the patient's impairments (multi-system involvement);For patient/therapist safety;To address functional/ADL transfers PT goals addressed during session: Mobility/safety with mobility;Balance;Strengthening/ROM OT goals addressed during session: ADL's and self-care;Strengthening/ROM      AM-PAC OT "6 Clicks"  Daily Activity     Outcome Measure Help from another person eating meals?: A Little Help from another person taking care of personal grooming?: A Lot Help from another person toileting, which includes using toliet, bedpan, or urinal?: A Lot Help from another person bathing (including washing, rinsing, drying)?: A Lot Help from another person to put on and taking off regular upper body clothing?: A Lot Help from another person to put on and taking off regular lower body clothing?: A Lot 6 Click Score: 13   End of Session Equipment Utilized During Treatment: Gait belt Nurse Communication: Mobility status  Activity Tolerance: Patient tolerated treatment well Patient left: in chair;with call bell/phone within reach;with chair alarm set  OT Visit Diagnosis: Unsteadiness on feet (R26.81);Other abnormalities of gait and mobility (R26.89);Muscle weakness (generalized) (M62.81);Other symptoms and signs involving cognitive function                Time: 0922-0940 OT Time Calculation (min): 18 min Charges:  OT General Charges $OT Visit: 1 Visit OT Evaluation $OT Eval Moderate Complexity: 1 Mod  [address], OTR/L Acute [employer] Pager [phone] Office [phone]     Almon Register 03/01/2019, 11:42 AM

## 2019-03-01 NOTE — Progress Notes (Signed)
  Speech Language Pathology Treatment: Cognitive-Linquistic(Dysarthria)  Patient Details Name: Casey Petty MRN: [id number] DOB: [birth year] Today's Date: 03/01/2019 Time: 1011-1026 SLP Time Calculation (min) (ACUTE ONLY): 15 min  Assessment / Plan / Recommendation Clinical Impression  Pt was seen for dysarthria treatment and was cooperative during the session. He was educated regarding the nature of dysarthria, and compensatory strategies to improve speech intelligibility. Dysarthria handout was provided to facilitate education and pt verbalized understanding regarding all areas of education. He used compensatory strategies at the phrase level with 40% accuracy increasing to 100% accuracy with mod-max cues for overarticulation and rate. At the word level he demonstrated 60% accuracy with min-mod cues for overarticulation. SLP will continue to follow pt.     HPI HPI: Pt is a 58 y.o. male with hypertension who  presented with c/o right leg pain/weakness starting on 02/27/19. MRI of the brain revealed 13 mm acute ischemic infarct involving the posterior limb of the left internal capsule. Additional possible punctate acute to early subacute ischemic infarct involving the posterior limb of the contralateral right internal capsule.      SLP Plan  Continue with current plan of care  Patient needs continued Speech Lanaguage Pathology Services    Recommendations                   Follow up Recommendations: Inpatient Rehab SLP Visit Diagnosis: Aphasia (R47.01);Dysarthria and anarthria (R47.1) Plan: Continue with current plan of care       Zyla Dascenzo I. Hardin Negus, [place], [place] Office number [phone] Pager [place] 03/01/2019, 10:45 AM

## 2019-03-01 NOTE — ED Notes (Signed)
ED TO INPATIENT HANDOFF REPORT  ED Nurse Name and Phone #:  Anderson Malta [phone]  S Name/Age/Gender Casey Petty 58 y.o. male Room/Bed: WA14/WA14  Code Status   Code Status: Full Code  Home/SNF/Other Home Patient oriented to: self, place, orientation, time Is this baseline? Yes   Triage Complete: Triage complete  Chief Complaint leg pain  Triage Note Pt arrived via EMS with complaints of right leg pain the started yesterday after work. Pt has no updated medical history after stating not seeing a provider in 20 years. BP elevated with EMS, pt stated ETOH on board.   Allergies No Known Allergies  Level of Care/Admitting Diagnosis ED Disposition    ED Disposition Condition Comment   Admit  Hospital Area: [facility] [[id number]]  Level of Care: Telemetry Medical [104]  I expect the patient will be discharged within 24 hours: No (not a candidate for 5C-Observation unit)  Covid Evaluation: Asymptomatic Screening Protocol (No Symptoms)  Diagnosis: Stroke [facility]) [[id number]]  Admitting Physician: Jani Gravel [[id number]]  Attending Physician: Jani Gravel [[id number]]  PT Class (Do Not Modify): Observation [104]  PT Acc Code (Do Not Modify): Observation [10022]       B Medical/Surgery History Past Medical History:  Diagnosis Date  . Hypertension    History reviewed. No pertinent surgical history.   A IV Location/Drains/Wounds Patient Lines/Drains/Airways Status   Active Line/Drains/Airways    Name:   Placement date:   Placement time:   Site:   Days:   Peripheral IV 02/28/19 Left Forearm   02/28/19    2254    Forearm   1          Intake/Output Last 24 hours No intake or output data in the 24 hours ending 03/01/19 0036  Labs/Imaging Results for orders placed or performed during the hospital encounter of 02/28/19 (from the past 48 hour(s))  CBC with Differential     Status: Abnormal   Collection Time: 02/28/19 10:23 PM  Result Value Ref Range   WBC 13.0 (H) 4.0  - 10.5 K/uL   RBC 5.07 4.22 - 5.81 MIL/uL   Hemoglobin 15.9 13.0 - 17.0 g/dL   HCT 47.8 39.0 - 52.0 %   MCV 94.3 80.0 - 100.0 fL   MCH 31.4 26.0 - 34.0 pg   MCHC 33.3 30.0 - 36.0 g/dL   RDW 13.5 11.5 - 15.5 %   Platelets 188 150 - 400 K/uL   nRBC 0.0 0.0 - 0.2 %   Neutrophils Relative % 53 %   Neutro Abs 7.1 1.7 - 7.7 K/uL   Lymphocytes Relative 32 %   Lymphs Abs 4.2 (H) 0.7 - 4.0 K/uL   Monocytes Relative 8 %   Monocytes Absolute 1.0 0.1 - 1.0 K/uL   Eosinophils Relative 5 %   Eosinophils Absolute 0.6 (H) 0.0 - 0.5 K/uL   Basophils Relative 1 %   Basophils Absolute 0.1 0.0 - 0.1 K/uL   Immature Granulocytes 1 %   Abs Immature Granulocytes 0.06 0.00 - 0.07 K/uL    Comment: Performed at [facility], [place] [address] [place], [place] [address]  Basic metabolic panel     Status: Abnormal   Collection Time: 02/28/19 10:23 PM  Result Value Ref Range   Sodium 138 135 - 145 mmol/L   Potassium 3.5 3.5 - 5.1 mmol/L   Chloride 104 98 - 111 mmol/L   CO2 24 22 - 32 mmol/L   Glucose, Bld 112 (H) 70 - 99 mg/dL  BUN 9 6 - 20 mg/dL   Creatinine, Ser 0.91 0.61 - 1.24 mg/dL   Calcium 9.5 8.9 - 10.3 mg/dL   GFR calc non Af Amer >60 >60 mL/min   GFR calc Af Amer >60 >60 mL/min   Anion gap 10 5 - 15    Comment: Performed at [facility], [place] [address] [place], [place] [address]  Ethanol     Status: None   Collection Time: 02/28/19 10:23 PM  Result Value Ref Range   Alcohol, Ethyl (B) <10 <10 mg/dL    Comment: (NOTE) Lowest detectable limit for serum alcohol is 10 mg/dL. For medical purposes only. Performed at [facility], [place] [address], [place], [place] [address]   SARS Coronavirus 2 (CEPHEID - Performed in [place] hospital lab), Hosp Order     Status: None   Collection Time: 02/28/19 10:50 PM   Specimen: Nasopharyngeal Swab  Result Value Ref Range   SARS Coronavirus 2 NEGATIVE NEGATIVE    Comment: (NOTE) If result is  NEGATIVE SARS-CoV-2 target nucleic acids are NOT DETECTED. The SARS-CoV-2 RNA is generally detectable in upper and lower  respiratory specimens during the acute phase of infection. The lowest  concentration of SARS-CoV-2 viral copies this assay can detect is 250  copies / mL. A negative result does not preclude SARS-CoV-2 infection  and should not be used as the sole basis for treatment or other  patient management decisions.  A negative result may occur with  improper specimen collection / handling, submission of specimen other  than nasopharyngeal swab, presence of viral mutation(s) within the  areas targeted by this assay, and inadequate number of viral copies  (<250 copies / mL). A negative result must be combined with clinical  observations, patient history, and epidemiological information. If result is POSITIVE SARS-CoV-2 target nucleic acids are DETECTED. The SARS-CoV-2 RNA is generally detectable in upper and lower  respiratory specimens dur ing the acute phase of infection.  Positive  results are indicative of active infection with SARS-CoV-2.  Clinical  correlation with patient history and other diagnostic information is  necessary to determine patient infection status.  Positive results do  not rule out bacterial infection or co-infection with other viruses. If result is PRESUMPTIVE POSTIVE SARS-CoV-2 nucleic acids MAY BE PRESENT.   A presumptive positive result was obtained on the submitted specimen  and confirmed on repeat testing.  While 2019 novel coronavirus  (SARS-CoV-2) nucleic acids may be present in the submitted sample  additional confirmatory testing may be necessary for epidemiological  and / or clinical management purposes  to differentiate between  SARS-CoV-2 and other Sarbecovirus currently known to infect humans.  If clinically indicated additional testing with an alternate test  methodology [id number]) is advised. The SARS-CoV-2 RNA is generally  detectable  in upper and lower respiratory sp ecimens during the acute  phase of infection. The expected result is Negative. Fact Sheet for Patients:  StrictlyIdeas.no Fact Sheet for Healthcare Providers: BankingDealers.co.za This test is not yet approved or cleared by the [place] FDA and has been authorized for detection and/or diagnosis of SARS-CoV-2 by FDA under an Emergency Use Authorization (EUA).  This EUA will remain in effect (meaning this test can be used) for the duration of the COVID-19 declaration under Section 564(b)(1) of the Act, 21 U.S.C. section 360bbb-3(b)(1), unless the authorization is terminated or revoked sooner. Performed at [facility], [place] [address], [place], [place] [address]    Dg  Chest 2 View  Result Date: 03/01/2019 CLINICAL DATA:  Right leg weakness. Shortness of breath. EXAM: CHEST - 2 VIEW COMPARISON:  07/24/2015 FINDINGS: The cardiomediastinal contours are normal. The lungs are clear. Pulmonary vasculature is normal. No consolidation, pleural effusion, or pneumothorax. No acute osseous abnormalities are seen. IMPRESSION: No acute chest findings. Electronically Signed   By: Keith Rake M.D.   On: 03/01/2019 00:17   Dg Tibia/fibula Right  Result Date: 02/28/2019 CLINICAL DATA:  Right leg pain and swelling, onset yesterday. EXAM: RIGHT TIBIA AND FIBULA - 2 VIEW COMPARISON:  Concurrent knee radiographs. FINDINGS: Cortical margins of the tibia and fibular intact. Proximal aspect of the lower extremity included on concurrent knee exam. There is no evidence of fracture or other focal bone lesions. Soft tissues are unremarkable. IMPRESSION: Negative radiographs of the right lower extremity. Electronically Signed   By: Keith Rake M.D.   On: 02/28/2019 21:02   [name] Contrast  Result Date: 02/28/2019 CLINICAL DATA:  Initial evaluation for acute right leg pain. EXAM: MRI HEAD WITHOUT  CONTRAST TECHNIQUE: Multiplanar, multiecho pulse sequences of the brain and surrounding structures were obtained without intravenous contrast. COMPARISON:  None available. FINDINGS: Brain: Examination technically limited as the patient was unable to tolerate the full length of the exam. Axial and coronal DWI sequence, with axial T2 and FLAIR sequences, and sagittal T1 weighted sequence only were performed. Additionally, images provided are degraded by motion artifact. Cerebral volume within normal limits for age. No significant cerebral white matter changes identified on this limited exam. 13 mm focus of restricted diffusion seen involving the posterior limb of the left internal capsule, compatible with acute ischemic infarct (series 4, image 25). No associated mass effect or definite hemorrhage on this limited exam. There is an additional possible punctate focus of diffusion abnormality involving the posterior limb of the contralateral right internal capsule (series 4, image 25), which could reflect an additional tiny acute to subacute ischemic infarct. No other evidence for acute or subacute ischemia. Gray-white matter differentiation otherwise maintained. No encephalomalacia seen elsewhere to suggest chronic cortical infarction. No mass lesion, midline shift or mass effect. Ventricles normal size without hydrocephalus. No extra-axial fluid collection. Vascular: Major intracranial vascular flow voids maintained. Skull and upper cervical spine: Craniocervical junction within normal limits. Scalp soft tissues and calvarium within normal limits. Sinuses/Orbits: Globes and orbital soft tissues demonstrate no acute finding. Mild scattered mucosal thickening throughout the ethmoidal air cells and maxillary sinuses. Trace layering fluid noted within the right maxillary sinus. Paranasal sinuses are otherwise clear. No mastoid effusion. Inner ear structures grossly normal. Other: None. IMPRESSION: 1. Technically limited  exam due to the patient's inability to tolerate the full length of the study as well as motion artifact. 2. 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 3. Additional possible punctate acute to early subacute ischemic infarct involving the posterior limb of the contralateral right internal capsule. Electronically Signed   By: Jeannine Boga M.D.   On: 02/28/2019 22:23   [name] Contrast  Result Date: 02/28/2019 CLINICAL DATA:  Initial evaluation for acute right lower extremity pain, no injury. EXAM: MRI LUMBAR SPINE WITHOUT CONTRAST TECHNIQUE: Multiplanar, multisequence MR imaging of the lumbar spine was performed. No intravenous contrast was administered. COMPARISON:  None available. FINDINGS: Segmentation: Standard. Lowest well-formed disc labeled the L5-S1 level. Alignment: Vertebral bodies normally aligned with preservation of the normal lumbar lordosis. No listhesis. Vertebrae: Vertebral body height well  maintained without evidence for acute or chronic fracture. Bone marrow signal intensity within normal limits. Mild reactive endplate changes present about the L5-S1 interspace. Reactive marrow edema present about the L4-5 facets bilaterally due to facet arthritis, right greater than left. No other abnormal marrow edema. No discrete or worrisome osseous lesions. Conus medullaris and cauda equina: Conus extends to the L2 level. Conus and cauda equina appear normal. Paraspinal and other soft tissues: Paraspinous soft tissues within normal limits. Visualized visceral structures unremarkable. 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 L1-2: Negative interspace. Mild bilateral facet hypertrophy. No canal or foraminal stenosis. L2-3: Negative interspace. Mild bilateral facet hypertrophy, slightly worse on the right. No significant canal or foraminal stenosis. No impingement. L3-4: Negative interspace. Moderate right with mild  left facet hypertrophy. No significant spinal stenosis. Foramina remain patent. No impingement. L4-5: Mild annular disc bulge with disc desiccation. Moderate facet and ligament flavum hypertrophy. Associated reactive marrow edema due to facet arthritis, right greater than left. No significant spinal stenosis. Mild bilateral L4 foraminal narrowing without impingement. L5-S1: Mild diffuse disc bulge with disc desiccation. Disc bulging slightly asymmetric to the left. Associated reactive endplate changes with marginal endplate osteophytic spurring. Mild to moderate facet hypertrophy, slightly worse on the left. Central canal remains patent. Moderate bilateral L5 foraminal stenosis, left slightly worse than right. IMPRESSION: 1. Mild degenerative disc bulging with facet hypertrophy at L5-S1 with resultant moderate bilateral L5 foraminal stenosis. Either of the exiting L5 nerve roots could be affected. 2. Disc bulging with moderate facet hypertrophy at L4-5 with resultant mild bilateral L4 foraminal stenosis. No frank impingement. 3. Mild-to-moderate multilevel facet hypertrophy throughout the lumbar spine, most pronounced at L4-5 where there is associated reactive marrow edema. Finding could contribute to underlying low back pain. Electronically Signed   By: Jeannine Boga M.D.   On: 02/28/2019 22:11   Dg Knee Complete 4 Views Right  Result Date: 02/28/2019 CLINICAL DATA:  Right leg pain and swelling, onset yesterday. EXAM: RIGHT KNEE - COMPLETE 4+ VIEW COMPARISON:  None. FINDINGS: Tricompartmental osteoarthritis with peripheral spurring. Mild medial tibiofemoral joint space narrowing. No significant joint effusion. No fracture, dislocation, or bony destructive change. No focal soft tissue abnormality. 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 Electronically Signed   By: Keith Rake M.D.   On: 02/28/2019 21:01    Pending Labs Unresulted Labs (From admission,  onward)    Start     Ordered   03/01/19 0500  HIV antibody (Routine Testing)  Tomorrow morning,   R     02/28/19 2339   03/01/19 0500  Hemoglobin A1c  Tomorrow morning,   R     02/28/19 2339   03/01/19 0500  Lipid panel  Tomorrow morning,   R    Comments: Fasting    02/28/19 2339   03/01/19 0037  Urine rapid drug screen (hosp performed)  ONCE - STAT,   STAT     03/01/19 0036          Vitals/Pain Today's Vitals   02/28/19 2019 02/28/19 2030 02/28/19 2228 02/28/19 2302  BP: (!) 188/104 (!) 180/104 (!) 134/103 (!) 195/115  Pulse: 88 90 71 71  Resp: 20 (!) 21 (!) 22 20  Temp: 98.4 F (36.9 C)   97.9 F (36.6 C)  TempSrc: Oral   Oral  SpO2: 99% 100% 100% 97%  Weight:      Height:      PainSc:    1  Isolation Precautions No active isolations  Medications Medications   stroke: mapping our early stages of recovery book (has no administration in time range)  acetaminophen (TYLENOL) tablet 650 mg (has no administration in time range)    Or  acetaminophen (TYLENOL) solution 650 mg (has no administration in time range)    Or  acetaminophen (TYLENOL) suppository 650 mg (has no administration in time range)  aspirin suppository 300 mg (has no administration in time range)    Or  aspirin tablet 325 mg (has no administration in time range)    Mobility walks with person assist High fall risk   Focused Assessments Neuro Assessment Handoff:  Swallow screen pass? Yes    NIH Stroke Scale ( + Modified Stroke Scale Criteria)  Interval: Initial Level of Consciousness (1a.)   : Alert, keenly responsive LOC Questions (1b. )   +: Answers both questions correctly LOC Commands (1c. )   + : Performs both tasks correctly Best Gaze (2. )  +: Normal Visual (3. )  +: No visual loss Facial Palsy (4. )    : Normal symmetrical movements Motor Arm, Left (5a. )   +: No drift Motor Arm, Right (5b. )   +: Drift Motor Leg, Left (6a. )   +: No drift Motor Leg, Right (6b. )   +:  Drift Limb Ataxia (7. ): Absent Sensory (8. )   +: Normal, no sensory loss Best Language (9. )   +: No aphasia Dysarthria (10. ): Mild-to-moderate dysarthria, patient slurs at least some words and, at worst, can be understood with some difficulty Extinction/Inattention (11.)   +: No Abnormality Modified SS Total  +: 2 Complete NIHSS TOTAL: 3     Neuro Assessment: Exceptions to WDL Neuro Checks:   Initial (02/28/19 2255)  Last Documented NIHSS Modified Score: 2 (02/28/19 2255) Has TPA been given? No If patient is a Neuro Trauma and patient is going to OR before floor call report to [place] nurse: [phone] or [phone]     R Recommendations: See Admitting Provider Note  Report given to:   Additional Notes:  NA

## 2019-03-01 NOTE — ED Notes (Signed)
[facility] arrived for transport 

## 2019-03-01 NOTE — Progress Notes (Signed)
PROGRESS NOTE    Casey Petty  YIR:[id number] DOB: 1960/11/30 DOA: 02/28/2019 PCP: Patient, [name]   Brief Narrative:  Per HPI: Casey Petty  is a 58 y.o. male,  w hypertension, presents with c/o right leg pain/weakness starting 5pm Wednesday. Pt denies numbness, tingling.   Pt denies headache, vision change, slurred speech, cp, palp, sob, n/v, abd pain, diarrhea, brbpr, dysuria, hematuria.  Pt is not currently on bp medication. He lives with his wife.    Patient was admitted with right-sided weakness secondary to acute lacunar subcortical CVA on the left side.  He has been started on dual antiplatelet therapy with carotid Dopplers, echocardiogram, and PT/OT evaluation currently pending.  Neurology following.  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-likely secondary to poorly controlled hypertension -Patient states that he has been off blood pressure medications for over 3 years Tele monitoring UDS negative Check hga1c, lipid pending Carotid ultrasound  Cardiac echo PT/OT/speech therapy consult Dual antiplatelet therapy ordered by neurology Continue Lipitor as ordered with lipid panel pending  Hypertension uncontrolled Continue amlodipine as ordered Hydralazine 5mg  iv q6h prn sbp >225  Hyperglycemia-improved Check hga1c as above, if >6.5 please start fsbs ac and qhs, ISS  Tobacco use Nicotine patch 21mg  topically qday prn  Pt counselled on cessation x 45minutes  DVT prophylaxis:SCDs Code Status: Full Family Communication: None at bedside Disposition Plan: Per Neurology, PT/OT evaluation. Echo and carotids pending.   Consultants:   Neurology  Procedures:   None  Antimicrobials:   None   Subjective: Patient seen and evaluated today with no new acute complaints or concerns. No acute concerns or events  noted overnight.  He continues to have ongoing right-sided weakness.  Objective: Vitals:   03/01/19 0326 03/01/19 0520 03/01/19 0651 03/01/19 0700  BP: (!) 187/87 (!) 181/96 (!) 177/94 (!) 185/87  Pulse:  81 70 75  Resp:   18 18  Temp: 98.4 F (36.9 C) 98.4 F (36.9 C) 98.4 F (36.9 C) 98.2 F (36.8 C)  TempSrc: Oral Oral Oral Oral  SpO2:  97% 99% 98%  Weight:      Height:        Intake/Output Summary (Last 24 hours) at 03/01/2019 1039 Last data filed at 03/01/2019 5009 Gross per 24 hour  Intake --  Output 300 ml  Net -300 ml   Filed Weights   02/28/19 2018 03/01/19 0321  Weight: 74.8 kg 74.3 kg    Examination:  General exam: Appears calm and comfortable  Respiratory system: Clear to auscultation. Respiratory effort normal. Cardiovascular system: S1 & S2 heard, RRR. No JVD, murmurs, rubs, gallops or clicks. No pedal edema. Gastrointestinal system: Abdomen is nondistended, soft and nontender. No organomegaly or masses felt. Normal bowel sounds heard. Central nervous system: Alert and oriented.  Noted to have pretty much symmetric strength in the upper and lower extremities bilaterally.  Cranial nerves grossly intact. Extremities: Symmetric 5 x 5 power. Skin: No rashes, lesions or ulcers Psychiatry: Judgement and insight appear normal. Mood & affect appropriate.     Data Reviewed: I have personally reviewed following labs and imaging studies  CBC: Recent Labs  Lab 02/28/19 2223  WBC 13.0*  NEUTROABS 7.1  HGB 15.9  HCT 47.8  MCV 94.3  PLT 381   Basic Metabolic Panel: Recent Labs  Lab 02/28/19 2223  NA 138  K 3.5  CL 104  CO2 24  GLUCOSE 112*  BUN 9  CREATININE 0.91  CALCIUM 9.5   GFR: Estimated Creatinine Clearance: 85.8 mL/min (by C-G formula based on SCr of 0.91 mg/dL). Liver Function Tests: No results for input(s): AST, ALT, ALKPHOS, BILITOT, PROT, ALBUMIN in the last 168 hours. No results for input(s): LIPASE, AMYLASE in the last 168 hours. No  results for input(s): AMMONIA in the last 168 hours. Coagulation Profile: No results for input(s): INR, PROTIME in the last 168 hours. Cardiac Enzymes: No results for input(s): CKTOTAL, CKMB, CKMBINDEX, TROPONINI in the last 168 hours. BNP (last 3 results) No results for input(s): PROBNP in the last 8760 hours. HbA1C: No results for input(s): HGBA1C in the last 72 hours. CBG: No results for input(s): GLUCAP in the last 168 hours. Lipid Profile: No results for input(s): CHOL, HDL, LDLCALC, TRIG, CHOLHDL, LDLDIRECT in the last 72 hours. Thyroid Function Tests: No results for input(s): TSH, T4TOTAL, FREET4, T3FREE, THYROIDAB in the last 72 hours. Anemia Panel: No results for input(s): VITAMINB12, FOLATE, FERRITIN, TIBC, IRON, RETICCTPCT in the last 72 hours. Sepsis Labs: No results for input(s): PROCALCITON, LATICACIDVEN in the last 168 hours.  Recent Results (from the past 240 hour(s))  SARS Coronavirus 2 (CEPHEID - Performed in [place] hospital lab), Hosp Order     Status: None   Collection Time: 02/28/19 10:50 PM   Specimen: Nasopharyngeal Swab  Result Value Ref Range Status   SARS Coronavirus 2 NEGATIVE NEGATIVE Final    Comment: (NOTE) If result is NEGATIVE SARS-CoV-2 target nucleic acids are NOT DETECTED. The SARS-CoV-2 RNA is generally detectable in upper and lower  respiratory specimens during the acute phase of infection. The lowest  concentration of SARS-CoV-2 viral copies this assay can detect is 250  copies / mL. A negative result does not preclude SARS-CoV-2 infection  and should not be used as the sole basis for treatment or other  patient management decisions.  A negative result may occur with  improper specimen collection / handling, submission of specimen other  than nasopharyngeal swab, presence of viral mutation(s) within the  areas targeted by this assay, and inadequate number of viral copies  (<250 copies / mL). A negative result must be combined with  clinical  observations, patient history, and epidemiological information. If result is POSITIVE SARS-CoV-2 target nucleic acids are DETECTED. The SARS-CoV-2 RNA is generally detectable in upper and lower  respiratory specimens dur ing the acute phase of infection.  Positive  results are indicative of active infection with SARS-CoV-2.  Clinical  correlation with patient history and other diagnostic information is  necessary to determine patient infection status.  Positive results do  not rule out bacterial infection or co-infection with other viruses. If result is PRESUMPTIVE POSTIVE SARS-CoV-2 nucleic acids MAY BE PRESENT.   A presumptive positive result was obtained on the submitted specimen  and confirmed on repeat testing.  While 2019 novel coronavirus  (SARS-CoV-2) nucleic acids may be present in the submitted sample  additional confirmatory testing may be necessary for epidemiological  and / or clinical management purposes  to differentiate between  SARS-CoV-2 and other Sarbecovirus currently known to infect humans.  If clinically indicated additional testing with an alternate test  methodology [phone]) is advised. The SARS-CoV-2 RNA is generally  detectable in upper and lower respiratory sp ecimens during the acute  phase of infection. The expected result is Negative. Fact Sheet for Patients:  StrictlyIdeas.no Fact Sheet for Healthcare Providers:  BankingDealers.co.za This test is not yet approved or cleared by the [place] and has been authorized for detection and/or diagnosis of SARS-CoV-2 by FDA under an Emergency Use Authorization (EUA).  This EUA will remain in effect (meaning this test can be used) for the duration of the COVID-19 declaration under Section 564(b)(1) of the Act, 21 U.S.C. section 360bbb-3(b)(1), unless the authorization is terminated or revoked sooner. Performed at [facility],  [place] [address], [place], [place] [address]          Radiology Studies: Dg Chest 2 View  Result Date: 03/01/2019 CLINICAL DATA:  Right leg weakness. Shortness of breath. EXAM: CHEST - 2 VIEW COMPARISON:  07/24/2015 FINDINGS: The cardiomediastinal contours are normal. The lungs are clear. Pulmonary vasculature is normal. No consolidation, pleural effusion, or pneumothorax. No acute osseous abnormalities are seen. IMPRESSION: No acute chest findings. Electronically Signed   By: Keith Rake M.D.   On: 03/01/2019 00:17   Dg Tibia/fibula Right  Result Date: 02/28/2019 CLINICAL DATA:  Right leg pain and swelling, onset yesterday. EXAM: RIGHT TIBIA AND FIBULA - 2 VIEW COMPARISON:  Concurrent knee radiographs. FINDINGS: Cortical margins of the tibia and fibular intact. Proximal aspect of the lower extremity included on concurrent knee exam. There is no evidence of fracture or other focal bone lesions. Soft tissues are unremarkable. IMPRESSION: Negative radiographs of the right lower extremity. Electronically Signed   By: Keith Rake M.D.   On: 02/28/2019 21:02   [name] Contrast  Result Date: 02/28/2019 CLINICAL DATA:  Initial evaluation for acute right leg pain. EXAM: MRI HEAD WITHOUT CONTRAST TECHNIQUE: Multiplanar, multiecho pulse sequences of the brain and surrounding structures were obtained without intravenous contrast. COMPARISON:  None available. FINDINGS: Brain: Examination technically limited as the patient was unable to tolerate the full length of the exam. Axial and coronal DWI sequence, with axial T2 and FLAIR sequences, and sagittal T1 weighted sequence only were performed. Additionally, images provided are degraded by motion artifact. Cerebral volume within normal limits for age. No significant cerebral white matter changes identified on this limited exam. 13 mm focus of restricted diffusion seen involving the posterior limb of the left internal capsule, compatible with acute  ischemic infarct (series 4, image 25). No associated mass effect or definite hemorrhage on this limited exam. There is an additional possible punctate focus of diffusion abnormality involving the posterior limb of the contralateral right internal capsule (series 4, image 25), which could reflect an additional tiny acute to subacute ischemic infarct. No other evidence for acute or subacute ischemia. Gray-white matter differentiation otherwise maintained. No encephalomalacia seen elsewhere to suggest chronic cortical infarction. No mass lesion, midline shift or mass effect. Ventricles normal size without hydrocephalus. No extra-axial fluid collection. Vascular: Major intracranial vascular flow voids maintained. Skull and upper cervical spine: Craniocervical junction within normal limits. Scalp soft tissues and calvarium within normal limits. Sinuses/Orbits: Globes and orbital soft tissues demonstrate no acute finding. Mild scattered mucosal thickening throughout the ethmoidal air cells and maxillary sinuses. Trace layering fluid noted within the right maxillary sinus. Paranasal sinuses are otherwise clear. No mastoid effusion. Inner ear structures grossly normal. Other: None. IMPRESSION: 1. Technically limited exam due to the patient's inability to tolerate the full length of the study as well as motion artifact. 2. 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 3. Additional possible punctate acute to early subacute ischemic infarct involving the posterior limb of the contralateral right  internal capsule. Electronically Signed   By: Jeannine Boga M.D.   On: 02/28/2019 22:23   [name] Contrast  Result Date: 02/28/2019 CLINICAL DATA:  Initial evaluation for acute right lower extremity pain, no injury. EXAM: MRI LUMBAR SPINE WITHOUT CONTRAST TECHNIQUE: Multiplanar, multisequence MR imaging of the lumbar spine was performed. No intravenous contrast was  administered. COMPARISON:  None available. FINDINGS: Segmentation: Standard. Lowest well-formed disc labeled the L5-S1 level. Alignment: Vertebral bodies normally aligned with preservation of the normal lumbar lordosis. No listhesis. Vertebrae: Vertebral body height well maintained without evidence for acute or chronic fracture. Bone marrow signal intensity within normal limits. Mild reactive endplate changes present about the L5-S1 interspace. Reactive marrow edema present about the L4-5 facets bilaterally due to facet arthritis, right greater than left. No other abnormal marrow edema. No discrete or worrisome osseous lesions. Conus medullaris and cauda equina: Conus extends to the L2 level. Conus and cauda equina appear normal. Paraspinal and other soft tissues: Paraspinous soft tissues within normal limits. Visualized visceral structures unremarkable. 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 L1-2: Negative interspace. Mild bilateral facet hypertrophy. No canal or foraminal stenosis. L2-3: Negative interspace. Mild bilateral facet hypertrophy, slightly worse on the right. No significant canal or foraminal stenosis. No impingement. L3-4: Negative interspace. Moderate right with mild left facet hypertrophy. No significant spinal stenosis. Foramina remain patent. No impingement. L4-5: Mild annular disc bulge with disc desiccation. Moderate facet and ligament flavum hypertrophy. Associated reactive marrow edema due to facet arthritis, right greater than left. No significant spinal stenosis. Mild bilateral L4 foraminal narrowing without impingement. L5-S1: Mild diffuse disc bulge with disc desiccation. Disc bulging slightly asymmetric to the left. Associated reactive endplate changes with marginal endplate osteophytic spurring. Mild to moderate facet hypertrophy, slightly worse on the left. Central canal remains patent. Moderate bilateral L5 foraminal stenosis, left slightly worse than  right. IMPRESSION: 1. Mild degenerative disc bulging with facet hypertrophy at L5-S1 with resultant moderate bilateral L5 foraminal stenosis. Either of the exiting L5 nerve roots could be affected. 2. Disc bulging with moderate facet hypertrophy at L4-5 with resultant mild bilateral L4 foraminal stenosis. No frank impingement. 3. Mild-to-moderate multilevel facet hypertrophy throughout the lumbar spine, most pronounced at L4-5 where there is associated reactive marrow edema. Finding could contribute to underlying low back pain. Electronically Signed   By: Jeannine Boga M.D.   On: 02/28/2019 22:11   Dg Knee Complete 4 Views Right  Result Date: 02/28/2019 CLINICAL DATA:  Right leg pain and swelling, onset yesterday. EXAM: RIGHT KNEE - COMPLETE 4+ VIEW COMPARISON:  None. FINDINGS: Tricompartmental osteoarthritis with peripheral spurring. Mild medial tibiofemoral joint space narrowing. No significant joint effusion. No fracture, dislocation, or bony destructive change. No focal soft tissue abnormality. 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 Electronically Signed   By: Keith Rake M.D.   On: 02/28/2019 21:01   Vas US Carotid (at [place] Only)  Result Date: 03/01/2019 Carotid Arterial Duplex Study Indications:       CVA. Risk Factors:      Hypertension, current smoker. Limitations:       Patient anatomy, patient respiratory disturbance Comparison Study:  No prior studies. Performing Technologist: Oliver Hum RVT  Examination Guidelines: A complete evaluation includes B-mode imaging, spectral Doppler, color Doppler, and power Doppler as needed of all accessible portions of each vessel. Bilateral testing is considered an integral part of a complete examination. Limited examinations for reoccurring indications may be performed  as noted.  Right Carotid Findings: +----------+--------+--------+--------+-----------------------+--------+             PSV cm/s EDV  cm/s Stenosis Describe                Comments  +----------+--------+--------+--------+-----------------------+--------+  CCA Prox   81       17                smooth and heterogenous           +----------+--------+--------+--------+-----------------------+--------+  CCA Distal 76       23                smooth and heterogenous           +----------+--------+--------+--------+-----------------------+--------+  ICA Prox   69       13                smooth and heterogenous           +----------+--------+--------+--------+-----------------------+--------+  ICA Distal 83       26                                        tortuous  +----------+--------+--------+--------+-----------------------+--------+  ECA        93       9                                                   +----------+--------+--------+--------+-----------------------+--------+ +----------+--------+-------+--------+-------------------+             PSV cm/s EDV cms Describe Arm Pressure (mmHG)  +----------+--------+-------+--------+-------------------+  Subclavian 104                                            +----------+--------+-------+--------+-------------------+ +---------+--------+--+--------+--+---------+  Vertebral PSV cm/s 65 EDV cm/s 18 Antegrade  +---------+--------+--+--------+--+---------+  Left Carotid Findings: +----------+--------+--------+--------+-----------------------+--------+             PSV cm/s EDV cm/s Stenosis Describe                Comments  +----------+--------+--------+--------+-----------------------+--------+  CCA Prox   98       20                smooth and heterogenous           +----------+--------+--------+--------+-----------------------+--------+  CCA Distal 83       19                smooth and heterogenous           +----------+--------+--------+--------+-----------------------+--------+  ICA Prox   69       26                smooth and heterogenous            +----------+--------+--------+--------+-----------------------+--------+  ICA Distal 82       32                                        tortuous  +----------+--------+--------+--------+-----------------------+--------+  ECA        89  17                                                  +----------+--------+--------+--------+-----------------------+--------+ +----------+--------+--------+--------+-------------------+  Subclavian PSV cm/s EDV cm/s Describe Arm Pressure (mmHG)  +----------+--------+--------+--------+-------------------+             218                                             +----------+--------+--------+--------+-------------------+ +---------+--------+--+--------+--+---------+  Vertebral PSV cm/s 64 EDV cm/s 20 Antegrade  +---------+--------+--+--------+--+---------+  Summary: Right Carotid: Velocities in the right ICA are consistent with a 1-39% stenosis. Left Carotid: Velocities in the left ICA are consistent with a 1-39% stenosis. Vertebrals: Bilateral vertebral arteries demonstrate antegrade flow. *See table(s) above for measurements and observations.     Preliminary         Scheduled Meds:  amLODipine  10 mg Oral Daily   aspirin  300 mg Rectal Daily   Or   aspirin  325 mg Oral Daily   atorvastatin  80 mg Oral q1800   [START ON 03/02/2019] clopidogrel  75 mg Oral Daily   Continuous Infusions:   LOS: 0 days    Time spent: 30 minutes    Estephania Licciardi Darleen Crocker, DO [facility] Pager [phone]  If 7PM-7AM, please contact night-coverage [email or web address] Password [facility] 03/01/2019, 10:39 AM

## 2019-03-01 NOTE — Progress Notes (Signed)
  Echocardiogram 2D Echocardiogram has been performed.  Bobbye Charleston 03/01/2019, 3:12 PM

## 2019-03-01 NOTE — ED Notes (Signed)
Attempted to call report, nurse unavailable at this time.

## 2019-03-01 NOTE — Progress Notes (Signed)
Carotid artery duplex has been completed. Preliminary results can be found in CV Proc through chart review.   03/01/19 9:17 AM Carlos Levering RVT

## 2019-03-01 NOTE — Evaluation (Signed)
Speech Language Pathology Evaluation Patient Details Name: Casey Petty MRN: [id number] DOB: 1960/10/06 Today's Date: 03/01/2019 Time: 8676-7209 SLP Time Calculation (min) (ACUTE ONLY): 23 min  Problem List:  Patient Active Problem List   Diagnosis Date Noted  . Stroke ([place]) 02/28/2019  . Essential hypertension 02/28/2019  . Tobacco abuse 02/28/2019  . Hyperglycemia 02/28/2019   Past Medical History:  Past Medical History:  Diagnosis Date  . Hypertension    Past Surgical History: History reviewed. No pertinent surgical history. HPI:  Pt is a 58 y.o. male with hypertension who  presented with c/o right leg pain/weakness starting on 02/27/19. MRI of the brain revealed 13 mm acute ischemic infarct involving the posterior limb of the left internal capsule. Additional possible punctate acute to early subacute ischemic infarct involving the posterior limb of the contralateral right internal capsule.   Assessment / Plan / Recommendation Clinical Impression  Pt reported that he was employed full-time as a traffic controller prior to admission and has a high-school education. He denied any baseline deficits in speech, language or cognition but described his speech now as "slurred" and expressed having difficulty with comprehension of complex information. He presented with mild receptive language deficits related to auditory comprehension of complex questions and instructions as well as reading comprehension at the paragraph level but verbal expression was functional. He demonstrated moderate dysarthria characterized by reduced articulatory precision, impaired coordination of respiration with speech, and reduced vocal intensity. Skilled SLP services are clinically indicated at this time to improve dysarthria and receptive language deficits.     SLP Assessment  SLP Recommendation/Assessment: Patient needs continued Speech Lanaguage Pathology Services SLP Visit Diagnosis: Aphasia  (R47.01);Dysarthria and anarthria (R47.1)    Follow Up Recommendations  Inpatient Rehab    Frequency and Duration min 2x/week  2 weeks      SLP Evaluation Cognition  Overall Cognitive Status: No family/caregiver present to determine baseline cognitive functioning Arousal/Alertness: Awake/alert Orientation Level: Oriented X4 Attention: Focused;Sustained Focused Attention: Appears intact Sustained Attention: Appears intact Memory: Appears intact(Immediate: 3/3; delayed: 3/3) Awareness: Appears intact Problem Solving: Impaired Problem Solving Impairment: Verbal complex       Comprehension  Auditory Comprehension Overall Auditory Comprehension: Impaired Yes/No Questions: Impaired Basic Biographical Questions: (5/5) Complex Questions: (3/5) Paragraph Comprehension (via yes/no questions): (4/4) Commands: Impaired Two Step Basic Commands: (4/4) Multistep Basic Commands: (2/4) Conversation: Simple Visual Recognition/Discrimination Discrimination: Within Function Limits Reading Comprehension Reading Status: Impaired Word level: Within functional limits Sentence Level: Within functional limits Paragraph Level: Impaired    Expression Expression Primary Mode of Expression: Verbal Verbal Expression Overall Verbal Expression: Impaired Initiation: No impairment Automatic Speech: Counting;Day of week(Days & months: WNL; Months: Pt stated he did not recall them) Level of Generative/Spontaneous Verbalization: Sentence Repetition: Impaired Level of Impairment: Sentence level(4/5) Naming: Impairment Responsive: (5/5) Confrontation: Within functional limits(10/10) Convergent: (5/5) Written Expression Dominant Hand: Right   Oral / Motor  Oral Motor/Sensory Function Overall Oral Motor/Sensory Function: Mild impairment Facial ROM: Reduced right;Suspected CN VII (facial) dysfunction Facial Symmetry: Abnormal symmetry right;Suspected CN VII (facial) dysfunction Facial Strength:  Reduced right;Suspected CN VII (facial) dysfunction Facial Sensation: Within Functional Limits Lingual ROM: Suspected CN XII (hypoglossal) dysfunction;Reduced right Lingual Symmetry: Within Functional Limits Lingual Strength: Reduced;Suspected CN XII (hypoglossal) dysfunction Lingual Sensation: Within Functional Limits Velum: Within Functional Limits Motor Speech Overall Motor Speech: Impaired(50% back to baseline per pt. ) Respiration: Within functional limits Phonation: Low vocal intensity Resonance: Within functional limits Articulation: Impaired Level of Impairment: Sentence Intelligibility: Intelligibility reduced Word:  75-100% accurate Phrase: 75-100% accurate Sentence: 50-74% accurate Conversation: 50-74% accurate Motor Planning: Witnin functional limits Motor Speech Errors: Aware;Consistent Effective Techniques: Slow rate;Over-articulate;Increased vocal intensity   Maanasa Aderhold I. Hardin Negus, [place], [place] Office number [phone] Pager [place] 03/01/2019, 10:41 AM

## 2019-03-01 NOTE — Consult Note (Signed)
Neurology Consultation Reason for Consult: Leg weakness Referring Physician: Georges Mouse  CC: Right-sided weakness  History is obtained from: Patient  HPI: Frankie Scipio is a 58 y.o. male with a history of hypertension who presents with right-sided weakness that started around 5 PM on Wednesday.  He states that he has been having trouble walking.  He denies numbness, vision change.  He states that it does seem to be getting worse. he denies any similar events prior.  He has a history of hypertension, but does not take any blood thinners at home, just amlodipine  Due to the symptoms not improving, he presented to the emergency department at [facility] where he was noted to have right leg weakness.  An MRI L-spine as well as brain were performed which demonstrates small posterior limb of internal capsule stroke.  LKW: 5 PM Wednesday tpa given?: no, outside of window    ROS: A 14 point ROS was performed and is negative except as noted in the HPI.   Past Medical History:  Diagnosis Date  . Hypertension      Family History  Problem Relation Age of Onset  . Heart attack Father      Social History:  reports that he has been smoking. He has never used smokeless tobacco. He reports current alcohol use of about 2.0 standard drinks of alcohol per week. He reports that he does not use drugs.   Exam: Current vital signs: BP (!) 181/96 (BP Location: Left Arm)   Pulse 81   Temp 98.4 F (36.9 C) (Oral)   Resp 18   Ht 5\' 7"  (1.702 m)   Wt 74.3 kg   SpO2 97%   BMI 25.66 kg/m  Vital signs in last 24 hours: Temp:  [97.9 F (36.6 C)-98.4 F (36.9 C)] 98.4 F (36.9 C) (07/24 0520) Pulse Rate:  [71-90] 81 (07/24 0520) Resp:  [16-26] 18 (07/24 0320) BP: (134-195)/(87-115) 181/96 (07/24 0520) SpO2:  [95 %-100 %] 97 % (07/24 0520) Weight:  [74.3 kg-74.8 kg] 74.3 kg (07/24 0321)   Physical Exam  Constitutional: Appears well-developed and well-nourished.  Psych: Affect appropriate to  situation Eyes: No scleral injection HENT: No OP obstrucion Head: Normocephalic.  Cardiovascular: Normal rate and regular rhythm.  Respiratory: Effort normal, non-labored breathing GI: Soft.  No distension. There is no tenderness.  Skin: WDI  Neuro: Mental Status: Patient is awake, alert, oriented to person, place, month, year, and situation. Patient is able to give a clear and coherent history. No signs of aphasia or neglect Cranial Nerves: II: Visual Fields are full. Pupils are equal, round, and reactive to light.   III,IV, VI: EOMI without ptosis or diploplia.  V: Facial sensation is symmetric to temperature VII: Facial movement with right facial weakness VIII: hearing is intact to voice X: Uvula elevates symmetrically XI: Shoulder shrug is symmetric. XII: tongue is midline without atrophy or fasciculations.  Motor: Tone is normal. Bulk is normal. 5/5 strength was present on the left, on the right he has 4/5 right arm and leg weakness, with distal more than proximal involvement. Sensory: Sensation is symmetric to light touch and temperature in the arms and legs. 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  He is being  admitted for further work-up and therapy.  It does sound like he has had some progression, and I will start dual antiplatelet therapy at this time.  Recommendations: - HgbA1c, fasting lipid panel - MRI, MRA  of the brain without contrast - Frequent neuro checks - Echocardiogram - Carotid dopplers - Prophylactic therapy-Antiplatelet med: Aspirin - 81 mg daily and plavix 75mg  daily after 300mg  load. Dual therapy x 3 weeks, then monotherapy.  - Risk factor modification -  Telemetry monitoring - PT consult, OT consult, Speech consult - Stroke team to follow    Roland Rack, MD [facility] [phone]  If 7pm- 7am, please page neurology on call as listed in [place].

## 2019-03-01 NOTE — Progress Notes (Signed)
Inpatient Rehabilitation Admissions Coordinator  Inpatient rehab consult received. I met with patient at bedside for rehab assessment. We discussed a possible inpt rehab admit pending his progress with therapy and medical workup completion. He would like to see how he does over the weekend. He did ask me to call his girlfriend, Katharine Look, which I did to give her updates., I also contacted Jenny Reichmann, Financial counselor to request assessment for disability and Medicaid eligibility. I will follow up on Monday.  Danne Baxter, RN, MSN Rehab Admissions Coordinator [phone] 03/01/2019 4:21 PM

## 2019-03-01 NOTE — Evaluation (Signed)
Physical Therapy Evaluation Patient Details Name: Casey Petty MRN: [id number] DOB: [birth year] Today's Date: 03/01/2019   History of Present Illness  Casey Petty  is a 58 y.o. male,  w hypertension, presents with c/o right leg pain/weakness. MRI:13 mm acute ischemic infarct involving the posterior limb of theleft internal capsule.andpossible punctate acute to early subacute ischemicinfarct involving the posterior limb of the contralateral rightinternal capsule.    Clinical Impression  Patient admitted with the above listed diagnosis. Patient reports independence with mobility and ADLs prior to admission. Patient today requiring physical assist for transfers and mobility - noted reduced weight shift to R LE with increased assist for steadying in standing. Requires knee blocking at R LE due to buckling with transfer. Due to current functional status and now requiring physical assist for mobility will recommend CIR level therapies at discharge. PT to follow acutely.      Follow Up Recommendations CIR    Equipment Recommendations  Other (comment)(TBD)    Recommendations for Other Services Rehab consult     Precautions / Restrictions Precautions Precautions: Fall Restrictions Weight Bearing Restrictions: No      Mobility  Bed Mobility Overal bed mobility: Needs Assistance Bed Mobility: Supine to Sit     Supine to sit: Min guard     General bed mobility comments: pt able to power himself up to EOB using tone on right  in his favor  Transfers Overall transfer level: Needs assistance Equipment used: 2 person hand held assist Transfers: Sit to/from [facility] Sit to Stand: Min assist;+2 physical assistance Stand pivot transfers: Mod assist;+2 physical assistance       General transfer comment: Pt needed A to block his right knee when he stepped with LLE otherwise RLE buckled  Ambulation/Gait             General Gait Details: deferred  Stairs             Wheelchair Mobility    Modified Rankin (Stroke Patients Only) Modified Rankin (Stroke Patients Only) Pre-Morbid Rankin Score: No symptoms Modified Rankin: Moderately severe disability     Balance Overall balance assessment: Needs assistance Sitting-balance support: No upper extremity supported Sitting balance-Leahy Scale: Fair     Standing balance support: Bilateral upper extremity supported;During functional activity Standing balance-Leahy Scale: Poor                               Pertinent Vitals/Pain Pain Assessment: No/denies pain    Home Living Family/patient expects to be discharged to:: Private residence Living Arrangements: Spouse/significant other Available Help at Discharge: Family;Available 24 hours/day Type of Home: Apartment Home Access: Stairs to enter Entrance Stairs-Rails: Right Entrance Stairs-Number of Steps: 10 Home Layout: One level Home Equipment: Grab bars - tub/shower      Prior Function Level of Independence: Independent         Comments: traffic controller on highway     Hand Dominance   Dominant Hand: Right    Extremity/Trunk Assessment   Upper Extremity Assessment Upper Extremity Assessment: Defer to OT evaluation RUE Deficits / Details: increased tone, he can move arm with greatly increased effort and not full ROM RUE Coordination: decreased fine motor;decreased gross motor    Lower Extremity Assessment Lower Extremity Assessment: RLE deficits/detail RLE Deficits / Details: increased tone, reduced weight shift to R LE - buckle noted; does not lift LE throughout full ROM against gravity  RLE Coordination: decreased fine motor;decreased gross  motor    Cervical / Trunk Assessment Cervical / Trunk Assessment: Normal  Communication   Communication: No difficulties  Cognition Arousal/Alertness: Awake/alert Behavior During Therapy: Impulsive Overall Cognitive Status: No family/caregiver present to  determine baseline cognitive functioning                                        General Comments      Exercises     Assessment/Plan    PT Assessment Patient needs continued PT services  PT Problem List Decreased strength;Decreased activity tolerance;Decreased balance;Decreased mobility;Decreased coordination;Decreased cognition;Decreased knowledge of use of DME;Decreased knowledge of precautions;Decreased safety awareness       PT Treatment Interventions DME instruction;Gait training;Stair training;Functional mobility training;Therapeutic activities;Therapeutic exercise;Balance training;Neuromuscular re-education;Patient/family education    PT Goals (Current goals can be found in the Care Plan section)  Acute Rehab PT Goals Patient Stated Goal: did not state PT Goal Formulation: With patient Time For Goal Achievement: 03/15/19 Potential to Achieve Goals: Good    Frequency Min 4X/week   Barriers to discharge        Co-evaluation PT/OT/SLP Co-Evaluation/Treatment: Yes Reason for Co-Treatment: Complexity of the patient's impairments (multi-system involvement);For patient/therapist safety;To address functional/ADL transfers PT goals addressed during session: Mobility/safety with mobility;Balance;Strengthening/ROM OT goals addressed during session: ADL's and self-care;Strengthening/ROM       AM-PAC PT "6 Clicks" Mobility  Outcome Measure Help needed turning from your back to your side while in a flat bed without using bedrails?: A Little Help needed moving from lying on your back to sitting on the side of a flat bed without using bedrails?: A Little Help needed moving to and from a bed to a chair (including a wheelchair)?: A Lot Help needed standing up from a chair using your arms (e.g., wheelchair or bedside chair)?: A Little Help needed to walk in hospital room?: A Lot Help needed climbing 3-5 steps with a railing? : Total 6 Click Score: 14    End of  Session Equipment Utilized During Treatment: Gait belt Activity Tolerance: Patient tolerated treatment well Patient left: in chair;with call bell/phone within reach;with chair alarm set Nurse Communication: Mobility status PT Visit Diagnosis: Unsteadiness on feet (R26.81);Other abnormalities of gait and mobility (R26.89);Muscle weakness (generalized) (M62.81)    Time: 0768-0881 PT Time Calculation (min) (ACUTE ONLY): 18 min   Charges:   PT Evaluation $PT Eval Moderate Complexity: 1 Mod         Lanney Gins, PT, DPT Supplemental Physical Therapist 03/01/19 12:42 PM Pager: [phone] Office: [phone]

## 2019-03-01 NOTE — Progress Notes (Signed)
Rehab Admissions Coordinator Note:  Per OT recommendation, patient was screened by Michel Santee for appropriateness for an Inpatient Acute Rehab Consult.  At this time, we are recommending Inpatient Rehab consult.  I will page MD for order.   Michel Santee 03/01/2019, 12:40 PM  I can be reached at [phone].

## 2019-03-02 DIAGNOSIS — I63312 Cerebral infarction due to thrombosis of left middle cerebral artery: Secondary | ICD-10-CM

## 2019-03-02 LAB — BASIC METABOLIC PANEL
Anion gap: 10 (ref 5–15)
BUN: 9 mg/dL (ref 6–20)
CO2: 24 mmol/L (ref 22–32)
Calcium: 9.6 mg/dL (ref 8.9–10.3)
Chloride: 101 mmol/L (ref 98–111)
Creatinine, Ser: 1.04 mg/dL (ref 0.61–1.24)
GFR calc Af Amer: >60 mL/min (ref >60)
GFR calc non Af Amer: >60 mL/min (ref >60)
Glucose, Bld: 154 mg/dL — ABNORMAL HIGH (ref 70–99)
Potassium: 3.9 mmol/L (ref 3.5–5.1)
Sodium: 135 mmol/L (ref 135–145)

## 2019-03-02 LAB — CBC
HCT: 47.9 % (ref 39.0–52.0)
Hemoglobin: 16.2 g/dL (ref 13.0–17.0)
MCH: 31 pg (ref 26.0–34.0)
MCHC: 33.8 g/dL (ref 30.0–36.0)
MCV: 91.6 fL (ref 80.0–100.0)
Platelets: 183 10*3/uL (ref 150–400)
RBC: 5.23 MIL/uL (ref 4.22–5.81)
RDW: 13.2 % (ref 11.5–15.5)
WBC: 10.7 10*3/uL — ABNORMAL HIGH (ref 4.0–10.5)
nRBC: 0 % (ref 0.0–0.2)

## 2019-03-02 MED ORDER — ENOXAPARIN SODIUM 40 MG/0.4ML ~~LOC~~ SOLN
40.0000 mg | Freq: Every day | SUBCUTANEOUS | Status: DC
  Administered 2019-03-02 – 2019-03-03 (×2): 40 mg via SUBCUTANEOUS
  Filled 2019-03-02 (×2): qty 0.4

## 2019-03-02 MED ORDER — LISINOPRIL 20 MG PO TABS
20.0000 mg | ORAL_TABLET | Freq: Every day | ORAL | Status: DC
  Administered 2019-03-02: 20 mg via ORAL
  Filled 2019-03-02: qty 1

## 2019-03-02 NOTE — Progress Notes (Addendum)
PROGRESS NOTE [facility]   Casey Petty   PYP:[id number] DOB: May 01, 1961  DOA: 02/28/2019 PCP: Patient, [name]   Brief Narrative:  Per HPI: [name] a57 y.o.male,w hypertension, presents with c/o right leg pain/weakness starting 5pm Wednesday. Pt denies numbness, tingling. Pt denies headache, vision change, slurred speech, cp, palp, sob, n/v, abd pain, diarrhea, brbpr, dysuria, hematuria. Pt is not currently on bp medication. He lives with his wife.   Patient was admitted with right-sided weakness secondary to acute lacunar subcortical CVA on the left side.  He has been started on dual antiplatelet therapy with carotid Dopplers, echocardiogram, and PT/OT evaluation currently pending.  Neurology following.  Subjective: Patient seen and examined, he is doing well, tolerating diet.  No concerns today.  He is awaiting for CIR placement.  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  Patient was off BP medications for over 3 years.  Neurology recommendations appreciated.  MRI shows 30 mm acute ischemic infarct involving the posterior limb of the left internal capsule.  Resultant dysarthria and right hemiparesis.  Echo shows LVEF 65%, A1c, LDL pending.  PT recommending CIR.  Continue DAPT for 3 weeks then ASA along.  Patient will likely be discharged to CIR on Monday.  Hypertension Permissive hypertension was allowed, discussed with neurology and recommended to start normalizing blood pressure slowly.  Patient currently on amlodipine 10 mg.  BP in the high and, neurology added lisinopril 20 mg daily.  Long-term BP goal normotensive <130/80.  Hyperlipidemia LDL goal less than 70.  Patient currently on Lipitor 80 mg daily.  Continue at  discharge.  Tobacco abuse Currently on nicotine patch 21 mg daily.  Smoking cessation discussed.  DVT prophylaxis: SCDs Code Status: Full code Family Communication: None at bedside Disposition Plan: Pending CIR admission  Consultants:   Neurology  Rehab medicine  Procedures:   None  Antimicrobials:  None   Objective: Vitals:   03/02/19 0351 03/02/19 0803 03/02/19 0804 03/02/19 1155  BP: (!) 160/107 (!) 177/99 (!) 177/99 (!) 181/92  Pulse: 91 89  76  Resp: 18 19  18   Temp: 98.4 F (36.9 C) 97.6 F (36.4 C)  97.9 F (36.6 C)  TempSrc: Oral Oral  Oral  SpO2: 94% 99%  98%  Weight:      Height:        Intake/Output Summary (Last 24 hours) at 03/02/2019 1647 Last data filed at 03/02/2019 0351 Gross per 24 hour  Intake --  Output 1300 ml  Net -1300 ml   Filed Weights   02/28/19 2018 03/01/19 0321  Weight: 74.8 kg 74.3 kg    Examination:  General exam: Appears calm and comfortable  HEENT: AC/AT, PERRLA, OP moist and clear Respiratory system: Clear to auscultation. No wheezes,crackle or rhonchi Cardiovascular system: S1 & S2 heard, RRR. No JVD, murmurs, rubs or gallops Central nervous system: Alert and oriented.  Mild dysarthria, right upper and lower extremity mild hemiparesis, strength 3/5.  Sensation decreased on the right side. Extremities: No pedal edema.  Skin: No rashes, lesions or ulcers Psychiatry: Judgement and insight appear normal. Mood & affect appropriate.    Data Reviewed: I have personally reviewed following labs and imaging studies  CBC: Recent Labs  Lab 02/28/19 2223 03/02/19 0412  WBC 13.0* 10.7*  NEUTROABS  7.1  --   HGB 15.9 16.2  HCT 47.8 47.9  MCV 94.3 91.6  PLT 188 102   Basic Metabolic Panel: Recent Labs  Lab 02/28/19 2223 03/02/19 0412  NA 138 135  K 3.5 3.9  CL 104 101  CO2 24 24  GLUCOSE 112* 154*  BUN 9 9  CREATININE 0.91 1.04  CALCIUM 9.5 9.6   GFR: Estimated Creatinine Clearance: 75 mL/min (by C-G formula  based on SCr of 1.04 mg/dL). Liver Function Tests: No results for input(s): AST, ALT, ALKPHOS, BILITOT, PROT, ALBUMIN in the last 168 hours. No results for input(s): LIPASE, AMYLASE in the last 168 hours. No results for input(s): AMMONIA in the last 168 hours. Coagulation Profile: No results for input(s): INR, PROTIME in the last 168 hours. Cardiac Enzymes: No results for input(s): CKTOTAL, CKMB, CKMBINDEX, TROPONINI in the last 168 hours. BNP (last 3 results) No results for input(s): PROBNP in the last 8760 hours. HbA1C: No results for input(s): HGBA1C in the last 72 hours. CBG: Recent Labs  Lab 03/01/19 2129  GLUCAP 145*   Lipid Profile: No results for input(s): CHOL, HDL, LDLCALC, TRIG, CHOLHDL, LDLDIRECT in the last 72 hours. Thyroid Function Tests: No results for input(s): TSH, T4TOTAL, FREET4, T3FREE, THYROIDAB in the last 72 hours. Anemia Panel: No results for input(s): VITAMINB12, FOLATE, FERRITIN, TIBC, IRON, RETICCTPCT in the last 72 hours. Sepsis Labs: No results for input(s): PROCALCITON, LATICACIDVEN in the last 168 hours.  Recent Results (from the past 240 hour(s))  SARS Coronavirus 2 (CEPHEID - Performed in [place] hospital lab), Hosp Order     Status: None   Collection Time: 02/28/19 10:50 PM   Specimen: Nasopharyngeal Swab  Result Value Ref Range Status   SARS Coronavirus 2 NEGATIVE NEGATIVE Final    Comment: (NOTE) If result is NEGATIVE SARS-CoV-2 target nucleic acids are NOT DETECTED. The SARS-CoV-2 RNA is generally detectable in upper and lower  respiratory specimens during the acute phase of infection. The lowest  concentration of SARS-CoV-2 viral copies this assay can detect is 250  copies / mL. A negative result does not preclude SARS-CoV-2 infection  and should not be used as the sole basis for treatment or other  patient management decisions.  A negative result may occur with  improper specimen collection / handling, submission of specimen other   than nasopharyngeal swab, presence of viral mutation(s) within the  areas targeted by this assay, and inadequate number of viral copies  (<250 copies / mL). A negative result must be combined with clinical  observations, patient history, and epidemiological information. If result is POSITIVE SARS-CoV-2 target nucleic acids are DETECTED. The SARS-CoV-2 RNA is generally detectable in upper and lower  respiratory specimens dur ing the acute phase of infection.  Positive  results are indicative of active infection with SARS-CoV-2.  Clinical  correlation with patient history and other diagnostic information is  necessary to determine patient infection status.  Positive results do  not rule out bacterial infection or co-infection with other viruses. If result is PRESUMPTIVE POSTIVE SARS-CoV-2 nucleic acids MAY BE PRESENT.   A presumptive positive result was obtained on the submitted specimen  and confirmed on repeat testing.  While 2019 novel coronavirus  (SARS-CoV-2) nucleic acids may be present in the submitted sample  additional confirmatory testing may be necessary for epidemiological  and / or clinical management purposes  to differentiate between  SARS-CoV-2 and other Sarbecovirus currently known to infect humans.  If clinically indicated additional testing  with an alternate test  methodology [phone]) is advised. The SARS-CoV-2 RNA is generally  detectable in upper and lower respiratory sp ecimens during the acute  phase of infection. The expected result is Negative. Fact Sheet for Patients:  StrictlyIdeas.no Fact Sheet for Healthcare Providers: BankingDealers.co.za This test is not yet approved or cleared by the [place] FDA and has been authorized for detection and/or diagnosis of SARS-CoV-2 by FDA under an Emergency Use Authorization (EUA).  This EUA will remain in effect (meaning this test can be used) for the duration of  the COVID-19 declaration under Section 564(b)(1) of the Act, 21 U.S.C. section 360bbb-3(b)(1), unless the authorization is terminated or revoked sooner. Performed at [facility], [employer] [address], [place], [place] [address]       Radiology Studies: Dg Chest 2 View  Result Date: 03/01/2019 CLINICAL DATA:  Right leg weakness. Shortness of breath. EXAM: CHEST - 2 VIEW COMPARISON:  07/24/2015 FINDINGS: The cardiomediastinal contours are normal. The lungs are clear. Pulmonary vasculature is normal. No consolidation, pleural effusion, or pneumothorax. No acute osseous abnormalities are seen. IMPRESSION: No acute chest findings. Electronically Signed   By: Keith Rake M.D.   On: 03/01/2019 00:17   Dg Tibia/fibula Right  Result Date: 02/28/2019 CLINICAL DATA:  Right leg pain and swelling, onset yesterday. EXAM: RIGHT TIBIA AND FIBULA - 2 VIEW COMPARISON:  Concurrent knee radiographs. FINDINGS: Cortical margins of the tibia and fibular intact. Proximal aspect of the lower extremity included on concurrent knee exam. There is no evidence of fracture or other focal bone lesions. Soft tissues are unremarkable. IMPRESSION: Negative radiographs of the right lower extremity. Electronically Signed   By: Keith Rake M.D.   On: 02/28/2019 21:02   [name] Contrast  Result Date: 02/28/2019 CLINICAL DATA:  Initial evaluation for acute right leg pain. EXAM: MRI HEAD WITHOUT CONTRAST TECHNIQUE: Multiplanar, multiecho pulse sequences of the brain and surrounding structures were obtained without intravenous contrast. COMPARISON:  None available. FINDINGS: Brain: Examination technically limited as the patient was unable to tolerate the full length of the exam. Axial and coronal DWI sequence, with axial T2 and FLAIR sequences, and sagittal T1 weighted sequence only were performed. Additionally, images provided are degraded by motion artifact. Cerebral volume within normal limits for age. No  significant cerebral white matter changes identified on this limited exam. 13 mm focus of restricted diffusion seen involving the posterior limb of the left internal capsule, compatible with acute ischemic infarct (series 4, image 25). No associated mass effect or definite hemorrhage on this limited exam. There is an additional possible punctate focus of diffusion abnormality involving the posterior limb of the contralateral right internal capsule (series 4, image 25), which could reflect an additional tiny acute to subacute ischemic infarct. No other evidence for acute or subacute ischemia. Gray-white matter differentiation otherwise maintained. No encephalomalacia seen elsewhere to suggest chronic cortical infarction. No mass lesion, midline shift or mass effect. Ventricles normal size without hydrocephalus. No extra-axial fluid collection. Vascular: Major intracranial vascular flow voids maintained. Skull and upper cervical spine: Craniocervical junction within normal limits. Scalp soft tissues and calvarium within normal limits. Sinuses/Orbits: Globes and orbital soft tissues demonstrate no acute finding. Mild scattered mucosal thickening throughout the ethmoidal air cells and maxillary sinuses. Trace layering fluid noted within the right maxillary sinus. Paranasal sinuses are otherwise clear. No mastoid effusion. Inner ear structures grossly normal. Other: None. IMPRESSION: 1. Technically limited exam due to the patient's inability to tolerate the  full length of the study as well as motion artifact. 2. 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 3. Additional possible punctate acute to early subacute ischemic infarct involving the posterior limb of the contralateral right internal capsule. Electronically Signed   By: Jeannine Boga M.D.   On: 02/28/2019 22:23   [name] Contrast  Result Date: 02/28/2019 CLINICAL DATA:  Initial evaluation for acute  right lower extremity pain, no injury. EXAM: MRI LUMBAR SPINE WITHOUT CONTRAST TECHNIQUE: Multiplanar, multisequence MR imaging of the lumbar spine was performed. No intravenous contrast was administered. COMPARISON:  None available. FINDINGS: Segmentation: Standard. Lowest well-formed disc labeled the L5-S1 level. Alignment: Vertebral bodies normally aligned with preservation of the normal lumbar lordosis. No listhesis. Vertebrae: Vertebral body height well maintained without evidence for acute or chronic fracture. Bone marrow signal intensity within normal limits. Mild reactive endplate changes present about the L5-S1 interspace. Reactive marrow edema present about the L4-5 facets bilaterally due to facet arthritis, right greater than left. No other abnormal marrow edema. No discrete or worrisome osseous lesions. Conus medullaris and cauda equina: Conus extends to the L2 level. Conus and cauda equina appear normal. Paraspinal and other soft tissues: Paraspinous soft tissues within normal limits. Visualized visceral structures unremarkable. 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 L1-2: Negative interspace. Mild bilateral facet hypertrophy. No canal or foraminal stenosis. L2-3: Negative interspace. Mild bilateral facet hypertrophy, slightly worse on the right. No significant canal or foraminal stenosis. No impingement. L3-4: Negative interspace. Moderate right with mild left facet hypertrophy. No significant spinal stenosis. Foramina remain patent. No impingement. L4-5: Mild annular disc bulge with disc desiccation. Moderate facet and ligament flavum hypertrophy. Associated reactive marrow edema due to facet arthritis, right greater than left. No significant spinal stenosis. Mild bilateral L4 foraminal narrowing without impingement. L5-S1: Mild diffuse disc bulge with disc desiccation. Disc bulging slightly asymmetric to the left. Associated reactive endplate changes with marginal  endplate osteophytic spurring. Mild to moderate facet hypertrophy, slightly worse on the left. Central canal remains patent. Moderate bilateral L5 foraminal stenosis, left slightly worse than right. IMPRESSION: 1. Mild degenerative disc bulging with facet hypertrophy at L5-S1 with resultant moderate bilateral L5 foraminal stenosis. Either of the exiting L5 nerve roots could be affected. 2. Disc bulging with moderate facet hypertrophy at L4-5 with resultant mild bilateral L4 foraminal stenosis. No frank impingement. 3. Mild-to-moderate multilevel facet hypertrophy throughout the lumbar spine, most pronounced at L4-5 where there is associated reactive marrow edema. Finding could contribute to underlying low back pain. Electronically Signed   By: Jeannine Boga M.D.   On: 02/28/2019 22:11   Dg Knee Complete 4 Views Right  Result Date: 02/28/2019 CLINICAL DATA:  Right leg pain and swelling, onset yesterday. EXAM: RIGHT KNEE - COMPLETE 4+ VIEW COMPARISON:  None. FINDINGS: Tricompartmental osteoarthritis with peripheral spurring. Mild medial tibiofemoral joint space narrowing. No significant joint effusion. No fracture, dislocation, or bony destructive change. No focal soft tissue abnormality. 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 Electronically Signed   By: Keith Rake M.D.   On: 02/28/2019 21:01   Vas US Carotid (at [place] Only)  Result Date: 03/01/2019 Carotid Arterial Duplex Study Indications:       CVA. Risk Factors:      Hypertension, current smoker. Limitations:       Patient anatomy, patient respiratory disturbance Comparison Study:  No prior studies. Performing Technologist: Oliver Hum RVT  Examination Guidelines: A complete evaluation includes B-mode imaging, spectral Doppler, color Doppler, and power Doppler as needed of all accessible portions of each vessel. Bilateral testing is considered an integral part of a complete examination. Limited  examinations for reoccurring indications may be performed as noted.  Right Carotid Findings: +----------+--------+--------+--------+-----------------------+--------+             PSV cm/s EDV cm/s Stenosis Describe                Comments  +----------+--------+--------+--------+-----------------------+--------+  CCA Prox   81       17                smooth and heterogenous           +----------+--------+--------+--------+-----------------------+--------+  CCA Distal 76       23                smooth and heterogenous           +----------+--------+--------+--------+-----------------------+--------+  ICA Prox   69       13                smooth and heterogenous           +----------+--------+--------+--------+-----------------------+--------+  ICA Distal 83       26                                        tortuous  +----------+--------+--------+--------+-----------------------+--------+  ECA        93       9                                                   +----------+--------+--------+--------+-----------------------+--------+ +----------+--------+-------+--------+-------------------+             PSV cm/s EDV cms Describe Arm Pressure (mmHG)  +----------+--------+-------+--------+-------------------+  Subclavian 104                                            +----------+--------+-------+--------+-------------------+ +---------+--------+--+--------+--+---------+  Vertebral PSV cm/s 65 EDV cm/s 18 Antegrade  +---------+--------+--+--------+--+---------+  Left Carotid Findings: +----------+--------+--------+--------+-----------------------+--------+             PSV cm/s EDV cm/s Stenosis Describe                Comments  +----------+--------+--------+--------+-----------------------+--------+  CCA Prox   98       20                smooth and heterogenous           +----------+--------+--------+--------+-----------------------+--------+  CCA Distal 83       19                smooth and heterogenous            +----------+--------+--------+--------+-----------------------+--------+  ICA Prox   69       26                smooth and heterogenous           +----------+--------+--------+--------+-----------------------+--------+  ICA Distal 82       32  tortuous  +----------+--------+--------+--------+-----------------------+--------+  ECA        89       17                                                  +----------+--------+--------+--------+-----------------------+--------+ +----------+--------+--------+--------+-------------------+  Subclavian PSV cm/s EDV cm/s Describe Arm Pressure (mmHG)  +----------+--------+--------+--------+-------------------+             218                                             +----------+--------+--------+--------+-------------------+ +---------+--------+--+--------+--+---------+  Vertebral PSV cm/s 64 EDV cm/s 20 Antegrade  +---------+--------+--+--------+--+---------+  Summary: Right Carotid: Velocities in the right ICA are consistent with a 1-39% stenosis. Left Carotid: Velocities in the left ICA are consistent with a 1-39% stenosis. Vertebrals: Bilateral vertebral arteries demonstrate antegrade flow. *See table(s) above for measurements and observations.     Preliminary    Vas [place] Transcranial Doppler  Result Date: 03/01/2019  Transcranial Doppler Indications: Stroke. Limitations for diagnostic windows: Unable to insonate occipital window. Performing Technologist: June Leap RDMS, RVT  Examination Guidelines: A complete evaluation includes B-mode imaging, spectral Doppler, color Doppler, and power Doppler as needed of all accessible portions of each vessel. Bilateral testing is considered an integral part of a complete examination. Limited examinations for reoccurring indications may be performed as noted.  +----------+-------------+----------+-----------+-------+  RIGHT TCD  Right VM (cm) Depth (cm) Pulsatility Comment   +----------+-------------+----------+-----------+-------+  MCA            56.00                   1.21              +----------+-------------+----------+-----------+-------+  ACA           -40.00                   1.42              +----------+-------------+----------+-----------+-------+  Term ICA       42.00                    1.1              +----------+-------------+----------+-----------+-------+  PCA            20.00                   1.16              +----------+-------------+----------+-----------+-------+  Opthalmic      22.00                   1.78              +----------+-------------+----------+-----------+-------+  ICA siphon     26.00                   0.98              +----------+-------------+----------+-----------+-------+  +----------+------------+----------+-----------+-------+  LEFT TCD   Left VM (cm) Depth (cm) Pulsatility Comment  +----------+------------+----------+-----------+-------+  MCA           59.00  0.99              +----------+------------+----------+-----------+-------+  ACA           -37.00                  1.23              +----------+------------+----------+-----------+-------+  Term ICA      37.00                   1.25              +----------+------------+----------+-----------+-------+  PCA           19.00                   1.35              +----------+------------+----------+-----------+-------+  Opthalmic     15.00                   1.79              +----------+------------+----------+-----------+-------+  ICA siphon    23.00                   1.49              +----------+------------+----------+-----------+-------+     Preliminary       Scheduled Meds:  amLODipine  10 mg Oral Daily   aspirin EC  81 mg Oral Daily   atorvastatin  80 mg Oral q1800   clopidogrel  75 mg Oral Daily   enoxaparin (LOVENOX) injection  40 mg Subcutaneous Daily   lisinopril  20 mg Oral Daily   Continuous Infusions:   LOS: 1 day    Time spent: Total of  25 minutes spent with pt, greater than 50% of which was spent in discussion of  treatment, counseling and coordination of care   Chipper Oman, MD  How to contact the [facility] Attending or Consulting provider [place] or covering provider during after hours [place], for this patient?  1. Check the care team in [facility] and look for a) attending/consulting [facility] provider listed and b) the [facility] team listed 2. Log into [email or web address] and use [place]'s universal password to access. If you do not have the password, please contact the hospital operator. 3. Locate the [facility] provider you are looking for under [facility] and page to a number that you can be directly reached. 4. If you still have difficulty reaching the provider, please page the [facility] (Director on Call) for the Hospitalists listed on amion for assistance.

## 2019-03-02 NOTE — Progress Notes (Signed)
STROKE TEAM PROGRESS NOTE   SUBJECTIVE (INTERVAL HISTORY) Pt lying in bed, still has slurry speech, right facial and arm leg weakness. Smoking cessation education provided. PT/OT recommend CIR.    OBJECTIVE Vitals:   03/01/19 1930 03/02/19 0000 03/02/19 0134 03/02/19 0351  BP:  (!) 179/121 (!) 151/93 (!) 160/107  Pulse:  93 95 91  Resp:  18 17 18   Temp: 98.4 F (36.9 C)  98 F (36.7 C) 98.4 F (36.9 C)  TempSrc: Oral  Oral Oral  SpO2:  100% 95% 94%  Weight:      Height:        CBC:  Recent Labs  Lab 02/28/19 2223 03/02/19 0412  WBC 13.0* 10.7*  NEUTROABS 7.1  --   HGB 15.9 16.2  HCT 47.8 47.9  MCV 94.3 91.6  PLT 188 222    Basic Metabolic Panel:  Recent Labs  Lab 02/28/19 2223 03/02/19 0412  NA 138 135  K 3.5 3.9  CL 104 101  CO2 24 24  GLUCOSE 112* 154*  BUN 9 9  CREATININE 0.91 1.04  CALCIUM 9.5 9.6    Lipid Panel:     Component Value Date/Time   CHOL 133 07/18/2014 0922   TRIG 411 (H) 07/18/2014 0922   HDL 38 (L) 07/18/2014 0922   CHOLHDL 3.5 07/18/2014 0922   VLDL NOT CALC 07/18/2014 0922   LDLCALC NOT CALC 07/18/2014 0922   HgbA1c: No results found for: HGBA1C Urine Drug Screen:     Component Value Date/Time   LABOPIA NONE DETECTED 03/01/2019 0707   COCAINSCRNUR NONE DETECTED 03/01/2019 0707   LABBENZ NONE DETECTED 03/01/2019 0707   AMPHETMU NONE DETECTED 03/01/2019 0707   THCU NONE DETECTED 03/01/2019 0707   LABBARB NONE DETECTED 03/01/2019 0707    Alcohol Level     Component Value Date/Time   [facility] <10 02/28/2019 2223    IMAGING  [name] Contrast 02/28/2019 IMPRESSION:  1. Technically limited exam due to the patient's inability to tolerate the full length of the study as well as motion artifact.  2. 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  3. Additional possible punctate acute to early subacute ischemic infarct involving the posterior limb of the contralateral right internal  capsule.   [name] Contrast 02/28/2019 IMPRESSION:  1. Mild degenerative disc bulging with facet hypertrophy at L5-S1 with resultant moderate bilateral L5 foraminal stenosis. Either of the exiting L5 nerve roots could be affected.  2. Disc bulging with moderate facet hypertrophy at L4-5 with resultant mild bilateral L4 foraminal stenosis. No frank impingement.  3. Mild-to-moderate multilevel facet hypertrophy throughout the lumbar spine, most pronounced at L4-5 where there is associated reactive marrow edema. Finding could contribute to underlying low back pain.   Vas US Carotid (at [place] Only) 03/01/2019 Summary:  Right Carotid: Velocities in the right ICA are consistent with a 1-39% stenosis.  Left Carotid: Velocities in the left ICA are consistent with a 1-39% stenosis.  Vertebrals: Bilateral vertebral arteries demonstrate antegrade flow.  Transthoracic Echocardiogram   1. The left ventricle has hyperdynamic systolic function, with an ejection fraction of >65%. The cavity size was normal. There is mild concentric left ventricular hypertrophy. Left ventricular diastolic Doppler parameters are consistent with impaired  relaxation.  2. The right ventricle has normal systolic function. The cavity was normal. There is no increase in right ventricular wall thickness. Right ventricular systolic pressure is normal.  3. The  aortic valve is tricuspid. No stenosis of the aortic valve.  4. The aorta is normal in size and structure.   EKG - SR rate 88 BPM. (See cardiology reading for complete details)    PHYSICAL EXAM Blood pressure (!) 160/107, pulse 91, temperature 98.4 F (36.9 C), temperature source Oral, resp. rate 18, height 5\' 7"  (1.702 m), weight 74.3 kg, SpO2 94 %. Pleasant middle-aged African-American male not in distress. . Afebrile. Head is nontraumatic. Neck is supple without bruit.    Cardiac exam no murmur or gallop. Lungs are clear to auscultation. Distal pulses are  well felt.  Neurological Exam :  He is awake alert oriented to time place and person.  Mild to moderate dysarthria.  No aphasia or apraxia.  Follows commands well.  Extraocular movements are full range without nystagmus.  Blinks to threat bilaterally.  Right lower facial weakness. Tongue midline.  Motor system exam shows right UE 2/5 and right LE 3/5. Left UE and LE 5/5. RUE and RLE sensation decreased comparing with left. Coordination is slow but accurate on the right and normal on the left.  Gait not tested    ASSESSMENT/PLAN [name] is a 58 y.o. male with history of hypertension presenting with right sided weakness and gait difficulties. He did not receive IV t-PA due to late presentation (>4.5 hours from time of onset).  Stroke: left BG small infarct, likely small vessel disease  Resultant dysarthria and right hemiparesis  MRI head - 13 mm acute ischemic infarct involving the posterior limb of the left internal capsule.   TCD unremarkable - normal MFVs bilaterally  Carotid Doppler  - unremarkable  2D Echo EF > 65%  [employer] Virus 2  - negative  LDL pending  HgbA1c - pending  UDS - negative  VTE prophylaxis - lovenox subq  Diet  - Heart healthy with thin liquids.  No antithrombotic prior to admission, now on aspirin 81 mg daily and clopidogrel 75 mg daily. Continue DAPT for 3 weeks and then ASA alone.  Patient counseled to be compliant with his antithrombotic medications  Ongoing aggressive stroke risk factor management  Therapy recommendations:  CIR recommended  Disposition:  Pending  Hypertension  Blood pressure on the high end  On amlodipine 10  Add lisinopril 20 . Gradually normalize in 2-3 days . Long-term BP goal normotensive  Hyperlipidemia  Lipid lowering medication PTA:  none  LDL pending, goal < 70  Current lipid lowering medication: Lipitor 80 mg daily  Continue statin at discharge  Tobacco abuse  Current smoker  Smoking  cessation counseling provided  Pt is willing to quit  Other Stroke Risk Factors  ETOH use, advised to drink no more than 1 alcoholic beverage per day.  Other Active Problems  Mild leukocytosis - 13.0 - (afebrile)   Hospital day # 1  Neurology will sign off. Please call with questions. Pt will follow up with stroke clinic NP at [facility] in about 4 weeks. Thanks for the consult.  Rosalin Hawking, MD PhD Stroke Neurology 03/02/2019 1:43 PM   To contact Stroke Continuity provider, please refer to [email or web address] After hours, contact General Neurology

## 2019-03-02 NOTE — Plan of Care (Signed)
  Problem: Education: Goal: Knowledge of disease or condition will improve Outcome: Progressing Goal: Knowledge of secondary prevention will improve Outcome: Progressing   Problem: Coping: Goal: Will verbalize positive feelings about self Outcome: Progressing   Problem: Self-Care: Goal: Ability to participate in self-care as condition permits will improve Outcome: Progressing

## 2019-03-03 MED ORDER — METHYLPREDNISOLONE SODIUM SUCC 125 MG IJ SOLR
60.0000 mg | Freq: Four times a day (QID) | INTRAMUSCULAR | Status: DC
  Administered 2019-03-03 – 2019-03-04 (×3): 60 mg via INTRAVENOUS
  Filled 2019-03-03 (×4): qty 2

## 2019-03-03 MED ORDER — METHYLPREDNISOLONE SODIUM SUCC 125 MG IJ SOLR: 125.0000 mg | Freq: Once | INTRAMUSCULAR | Status: DC

## 2019-03-03 MED ORDER — METHYLPREDNISOLONE SODIUM SUCC 125 MG IJ SOLR
60.0000 mg | Freq: Once | INTRAMUSCULAR | Status: AC
  Administered 2019-03-03: 60 mg via INTRAVENOUS
  Filled 2019-03-03: qty 2

## 2019-03-03 MED ORDER — FAMOTIDINE IN NACL 20-0.9 MG/50ML-% IV SOLN
20.0000 mg | Freq: Two times a day (BID) | INTRAVENOUS | Status: DC
  Administered 2019-03-03 – 2019-03-04 (×3): 20 mg via INTRAVENOUS
  Filled 2019-03-03 (×3): qty 50

## 2019-03-03 MED ORDER — DIPHENHYDRAMINE HCL 50 MG/ML IJ SOLN
25.0000 mg | Freq: Four times a day (QID) | INTRAMUSCULAR | Status: DC
  Administered 2019-03-03 – 2019-03-04 (×3): 25 mg via INTRAVENOUS
  Filled 2019-03-03 (×4): qty 1

## 2019-03-03 MED ORDER — DIPHENHYDRAMINE HCL 50 MG/ML IJ SOLN
25.0000 mg | Freq: Once | INTRAMUSCULAR | Status: AC
  Administered 2019-03-03: 25 mg via INTRAVENOUS
  Filled 2019-03-03: qty 1

## 2019-03-03 NOTE — Progress Notes (Signed)
Noted Angioedema on right side of face and lips, notified [facility] MD, orders received.  Arlis Porta

## 2019-03-03 NOTE — Progress Notes (Signed)
PROGRESS NOTE [facility]   Casey Petty   HUD:[id number] DOB: Jul 30, 1961  DOA: 02/28/2019 PCP: Patient, [name]   Brief Narrative:  Per HPI: Casey Petty a57 y.o.male,w hypertension, presents with c/o right leg pain/weakness starting 5pm Wednesday. Pt denies numbness, tingling. Pt denies headache, vision change, slurred speech, cp, palp, sob, n/v, abd pain, diarrhea, brbpr, dysuria, hematuria. Pt is not currently on bp medication. He lives with his wife.   Patient was admitted with right-sided weakness secondary to acute lacunar subcortical CVA on the left side.  He has been started on dual antiplatelet therapy with carotid Dopplers, echocardiogram, and PT/OT evaluation currently pending.  Neurology following.  Subjective: Patient was started on new antihypertensive medications yesterday.  He wakes up this morning with swelling of his face and no nausea no vomiting.  No trouble breathing.  No trouble swallowing saliva.  No other acute complaints.  No chest pain.  No rash anywhere else.  Assessment & Plan:   Principal Problem:   Stroke [facility]) Active Problems:   Essential hypertension   Tobacco abuse   Hyperglycemia  Acute left lacunar CVA with right-sided hemiparesis Felt to be likely secondary to poor control hypertension and tobacco abuse.  Patient was off BP medications for over 3 years.  Neurology recommendations appreciated.   MRI shows 13 mm acute ischemic infarct involving the posterior limb of the left internal capsule.   Resultant dysarthria and right hemiparesis.   Echo shows LVEF 65%, A1c, LDL pending.   PT recommending CIR.   Continue DAPT for 3 weeks then ASA along.  CIR reviewing patient's chart.  Hypertension urgency Permissive hypertension was allowed,  discussed with neurology and recommended to start normalizing blood pressure slowly.   Patient currently on amlodipine 10 mg.   BP in the high and, neurology added lisinopril 20 mg  daily.  Long-term BP goal normotensive <130/80. Due to angioedema will discontinue lisinopril.  Acute angioedema secondary to lisinopril. Medication discontinued. Currently stable. No crowding of pharynx or stridor on examination. Continue close monitoring. Continue supportive measures.  Hyperlipidemia LDL goal less than 70.  Patient currently on Lipitor 80 mg daily.  Continue at discharge.  Tobacco abuse Currently on nicotine patch 21 mg daily.  Smoking cessation discussed.  DVT prophylaxis: SCDs Code Status: Full code Family Communication: None at bedside Disposition Plan: Pending CIR admission  Consultants:   Neurology  Rehab medicine  Procedures:   None  Antimicrobials:  None   Objective: Vitals:   03/03/19 0317 03/03/19 0752 03/03/19 1047 03/03/19 1116  BP:  130/86 137/89 (!) 143/89  Pulse: 82 72 77 78  Resp:  16  14  Temp: 98.1 F (36.7 C) 98 F (36.7 C)  98 F (36.7 C)  TempSrc: Oral Oral  Oral  SpO2: 100% 98% 94% 97%  Weight:      Height:        Intake/Output Summary (Last 24 hours) at 03/03/2019 1655 Last data filed at 03/03/2019 1122 Gross per 24 hour  Intake 100 ml  Output 500 ml  Net -400 ml   Filed Weights   02/28/19 2018 03/01/19 0321  Weight: 74.8 kg 74.3 kg    Examination:  General exam: Appears calm and comfortable  HEENT: AC/AT, PERRLA, OP moist and clear Respiratory system: Clear to auscultation. No wheezes,crackle or rhonchi Cardiovascular system: S1 & S2 heard, RRR. No JVD, murmurs, rubs or gallops Central nervous system: Alert and oriented.  Mild dysarthria, right upper and lower extremity mild hemiparesis, strength 3/5.  Sensation decreased on the right side. Extremities: No pedal edema.  Skin: No rashes, lesions or ulcers Psychiatry: Judgement and insight appear normal. Mood & affect appropriate.    Data Reviewed: I have personally reviewed following labs and imaging studies  CBC: Recent Labs  Lab 02/28/19 2223  03/02/19 0412  WBC 13.0* 10.7*  NEUTROABS 7.1  --   HGB 15.9 16.2  HCT 47.8 47.9  MCV 94.3 91.6  PLT 188 062   Basic Metabolic Panel: Recent Labs  Lab 02/28/19 2223 03/02/19 0412  NA 138 135  K 3.5 3.9  CL 104 101  CO2 24 24  GLUCOSE 112* 154*  BUN 9 9  CREATININE 0.91 1.04  CALCIUM 9.5 9.6   GFR: Estimated Creatinine Clearance: 75 mL/min (by C-G formula based on SCr of 1.04 mg/dL). Liver Function Tests: No results for input(s): AST, ALT, ALKPHOS, BILITOT, PROT, ALBUMIN in the last 168 hours. No results for input(s): LIPASE, AMYLASE in the last 168 hours. No results for input(s): AMMONIA in the last 168 hours. Coagulation Profile: No results for input(s): INR, PROTIME in the last 168 hours. Cardiac Enzymes: No results for input(s): CKTOTAL, CKMB, CKMBINDEX, TROPONINI in the last 168 hours. BNP (last 3 results) No results for input(s): PROBNP in the last 8760 hours. HbA1C: No results for input(s): HGBA1C in the last 72 hours. CBG: Recent Labs  Lab 03/01/19 2129  GLUCAP 145*   Lipid Profile: No results for input(s): CHOL, HDL, LDLCALC, TRIG, CHOLHDL, LDLDIRECT in the last 72 hours. Thyroid Function Tests: No results for input(s): TSH, T4TOTAL, FREET4, T3FREE, THYROIDAB in the last 72 hours. Anemia Panel: No results for input(s): VITAMINB12, FOLATE, FERRITIN, TIBC, IRON, RETICCTPCT in the last 72 hours. Sepsis Labs: No results for input(s): PROCALCITON, LATICACIDVEN in the last 168 hours.  Recent Results (from the past 240 hour(s))  SARS Coronavirus 2 (CEPHEID - Performed in [place] hospital lab), Hosp Order     Status: None   Collection Time: 02/28/19 10:50 PM   Specimen: Nasopharyngeal Swab  Result Value Ref Range Status   SARS Coronavirus 2 NEGATIVE NEGATIVE Final    Comment: (NOTE) If result is NEGATIVE SARS-CoV-2 target nucleic acids are NOT DETECTED. The SARS-CoV-2 RNA is generally detectable in upper and lower  respiratory specimens during the  acute phase of infection. The lowest  concentration of SARS-CoV-2 viral copies this assay can detect is 250  copies / mL. A negative result does not preclude SARS-CoV-2 infection  and should not be used as the sole basis for treatment or other  patient management decisions.  A negative result may occur with  improper specimen collection / handling, submission of specimen other  than nasopharyngeal swab, presence of viral mutation(s) within the  areas targeted by this assay, and inadequate number of viral copies  (<250 copies / mL). A negative result must be combined with clinical  observations, patient history, and epidemiological information. If result is POSITIVE SARS-CoV-2 target nucleic acids are DETECTED. The SARS-CoV-2 RNA is generally detectable in upper and lower  respiratory specimens dur ing the acute phase of infection.  Positive  results are indicative of active infection with SARS-CoV-2.  Clinical  correlation with patient history and other diagnostic information is  necessary to determine patient infection status.  Positive results do  not rule out bacterial infection or co-infection with other viruses. If result is PRESUMPTIVE POSTIVE SARS-CoV-2 nucleic acids MAY BE PRESENT.   A presumptive positive result was obtained on the submitted specimen  and confirmed on repeat testing.  While 2019 novel coronavirus  (SARS-CoV-2) nucleic acids may be present in the submitted sample  additional confirmatory testing may be necessary for epidemiological  and / or clinical management purposes  to differentiate between  SARS-CoV-2 and other Sarbecovirus currently known to infect humans.  If clinically indicated additional testing with an alternate test  methodology [phone]) is advised. The SARS-CoV-2 RNA is generally  detectable in upper and lower respiratory sp ecimens during the acute  phase of infection. The expected result is Negative. Fact Sheet for Patients:   StrictlyIdeas.no Fact Sheet for Healthcare Providers: BankingDealers.co.za This test is not yet approved or cleared by the [place] FDA and has been authorized for detection and/or diagnosis of SARS-CoV-2 by FDA under an Emergency Use Authorization (EUA).  This EUA will remain in effect (meaning this test can be used) for the duration of the COVID-19 declaration under Section 564(b)(1) of the Act, 21 U.S.C. section 360bbb-3(b)(1), unless the authorization is terminated or revoked sooner. Performed at [facility], [place] [address], [place], [place] [address]       Radiology Studies: No results found.    Scheduled Meds:  amLODipine  10 mg Oral Daily   aspirin EC  81 mg Oral Daily   atorvastatin  80 mg Oral q1800   clopidogrel  75 mg Oral Daily   diphenhydrAMINE  25 mg Intravenous Q6H   methylPREDNISolone (SOLU-MEDROL) injection  60 mg Intravenous Q6H   Continuous Infusions:  famotidine (PEPCID) IV 20 mg (03/03/19 1122)     LOS: 2 days    Time spent: Total of 25 minutes spent with pt, greater than 50% of which was spent in discussion of  treatment, counseling and coordination of care   Berle Mull, MD  How to contact the [facility] Attending or Consulting provider [place] or covering provider during after hours [place], for this patient?  1. Check the care team in [facility] and look for a) attending/consulting [facility] provider listed and b) the [facility] team listed 2. Log into [email or web address] and use [place]'s universal password to access. If you do not have the password, please contact the hospital operator. 3. Locate the [facility] provider you are looking for under [facility] and page to a number that you can be directly reached. 4. If you still have difficulty reaching the provider, please page the [facility] (Director on Call) for the Hospitalists listed on amion for assistance.

## 2019-03-03 NOTE — Plan of Care (Signed)
  Problem: Education: Goal: Knowledge of General Education information will improve Description: Including pain rating scale, medication(s)/side effects and non-pharmacologic comfort measures Outcome: Progressing   Problem: Health Behavior/Discharge Planning: Goal: Ability to manage health-related needs will improve Outcome: Progressing   Problem: Clinical Measurements: Goal: Ability to maintain clinical measurements within normal limits will improve Outcome: Progressing   Problem: Activity: Goal: Risk for activity intolerance will decrease Outcome: Progressing   Problem: Nutrition: Goal: Adequate nutrition will be maintained Outcome: Progressing   Problem: Coping: Goal: Level of anxiety will decrease Outcome: Progressing   Problem: Elimination: Goal: Will not experience complications related to bowel motility Outcome: Progressing   Problem: Pain Managment: Goal: General experience of comfort will improve Outcome: Progressing   Problem: Safety: Goal: Ability to remain free from injury will improve Outcome: Progressing   Problem: Skin Integrity: Goal: Risk for impaired skin integrity will decrease Outcome: Progressing   Problem: Education: Goal: Knowledge of disease or condition will improve Outcome: Progressing Goal: Knowledge of secondary prevention will improve Outcome: Progressing Goal: Knowledge of patient specific risk factors addressed and post discharge goals established will improve Outcome: Progressing   Problem: Coping: Goal: Will verbalize positive feelings about self Outcome: Progressing   Problem: Health Behavior/Discharge Planning: Goal: Ability to manage health-related needs will improve Outcome: Progressing   Problem: Self-Care: Goal: Ability to participate in self-care as condition permits will improve Outcome: Progressing   Problem: Nutrition: Goal: Dietary intake will improve Outcome: Progressing   Problem: Ischemic Stroke/TIA Tissue  Perfusion: Goal: Complications of ischemic stroke/TIA will be minimized Outcome: Progressing   Ival Bible, BSN, RN

## 2019-03-04 ENCOUNTER — Encounter (HOSPITAL_COMMUNITY): Payer: Self-pay

## 2019-03-04 ENCOUNTER — Other Ambulatory Visit: Payer: Self-pay

## 2019-03-04 ENCOUNTER — Inpatient Hospital Stay (HOSPITAL_COMMUNITY)
Admission: RE | Admit: 2019-03-04 | Discharge: 2019-03-20 | DRG: 057 | Disposition: A | Discharge status: Home Health | Payer: Medicaid Other | Source: Intra-hospital | Attending: Physical Medicine & Rehabilitation | Admitting: Physical Medicine & Rehabilitation

## 2019-03-04 DIAGNOSIS — M21371 Foot drop, right foot: Secondary | ICD-10-CM | POA: Diagnosis present

## 2019-03-04 DIAGNOSIS — M48061 Spinal stenosis, lumbar region without neurogenic claudication: Secondary | ICD-10-CM | POA: Diagnosis present

## 2019-03-04 DIAGNOSIS — Z8249 Family history of ischemic heart disease and other diseases of the circulatory system: Secondary | ICD-10-CM

## 2019-03-04 DIAGNOSIS — I69392 Facial weakness following cerebral infarction: Secondary | ICD-10-CM | POA: Diagnosis not present

## 2019-03-04 DIAGNOSIS — I69351 Hemiplegia and hemiparesis following cerebral infarction affecting right dominant side: Principal | ICD-10-CM

## 2019-03-04 DIAGNOSIS — D72829 Elevated white blood cell count, unspecified: Secondary | ICD-10-CM | POA: Diagnosis not present

## 2019-03-04 DIAGNOSIS — R7309 Other abnormal glucose: Secondary | ICD-10-CM

## 2019-03-04 DIAGNOSIS — K5900 Constipation, unspecified: Secondary | ICD-10-CM | POA: Diagnosis present

## 2019-03-04 DIAGNOSIS — E1165 Type 2 diabetes mellitus with hyperglycemia: Secondary | ICD-10-CM | POA: Diagnosis not present

## 2019-03-04 DIAGNOSIS — F17210 Nicotine dependence, cigarettes, uncomplicated: Secondary | ICD-10-CM | POA: Diagnosis present

## 2019-03-04 DIAGNOSIS — G8191 Hemiplegia, unspecified affecting right dominant side: Secondary | ICD-10-CM | POA: Diagnosis not present

## 2019-03-04 DIAGNOSIS — E785 Hyperlipidemia, unspecified: Secondary | ICD-10-CM | POA: Diagnosis present

## 2019-03-04 DIAGNOSIS — Z91199 Patient's noncompliance with other medical treatment and regimen due to unspecified reason: Secondary | ICD-10-CM

## 2019-03-04 DIAGNOSIS — R4587 Impulsiveness: Secondary | ICD-10-CM

## 2019-03-04 DIAGNOSIS — Y92239 Unspecified place in hospital as the place of occurrence of the external cause: Secondary | ICD-10-CM | POA: Diagnosis present

## 2019-03-04 DIAGNOSIS — D72828 Other elevated white blood cell count: Secondary | ICD-10-CM | POA: Diagnosis present

## 2019-03-04 DIAGNOSIS — I6931 Attention and concentration deficit following cerebral infarction: Secondary | ICD-10-CM

## 2019-03-04 DIAGNOSIS — M25511 Pain in right shoulder: Secondary | ICD-10-CM | POA: Diagnosis not present

## 2019-03-04 DIAGNOSIS — I6381 Other cerebral infarction due to occlusion or stenosis of small artery: Secondary | ICD-10-CM | POA: Diagnosis present

## 2019-03-04 DIAGNOSIS — Z720 Tobacco use: Secondary | ICD-10-CM

## 2019-03-04 DIAGNOSIS — Z9119 Patient's noncompliance with other medical treatment and regimen: Secondary | ICD-10-CM | POA: Diagnosis not present

## 2019-03-04 DIAGNOSIS — G8110 Spastic hemiplegia affecting unspecified side: Secondary | ICD-10-CM | POA: Diagnosis not present

## 2019-03-04 DIAGNOSIS — Z9111 Patient's noncompliance with dietary regimen: Secondary | ICD-10-CM | POA: Diagnosis not present

## 2019-03-04 DIAGNOSIS — I10 Essential (primary) hypertension: Secondary | ICD-10-CM | POA: Diagnosis not present

## 2019-03-04 DIAGNOSIS — I639 Cerebral infarction, unspecified: Secondary | ICD-10-CM | POA: Diagnosis present

## 2019-03-04 DIAGNOSIS — R739 Hyperglycemia, unspecified: Secondary | ICD-10-CM | POA: Diagnosis not present

## 2019-03-04 DIAGNOSIS — E119 Type 2 diabetes mellitus without complications: Secondary | ICD-10-CM | POA: Diagnosis not present

## 2019-03-04 DIAGNOSIS — I69322 Dysarthria following cerebral infarction: Secondary | ICD-10-CM | POA: Diagnosis not present

## 2019-03-04 DIAGNOSIS — Z888 Allergy status to other drugs, medicaments and biological substances status: Secondary | ICD-10-CM | POA: Diagnosis not present

## 2019-03-04 DIAGNOSIS — I69311 Memory deficit following cerebral infarction: Secondary | ICD-10-CM

## 2019-03-04 DIAGNOSIS — G8111 Spastic hemiplegia affecting right dominant side: Secondary | ICD-10-CM | POA: Diagnosis not present

## 2019-03-04 DIAGNOSIS — T783XXA Angioneurotic edema, initial encounter: Secondary | ICD-10-CM | POA: Diagnosis present

## 2019-03-04 DIAGNOSIS — T380X5A Adverse effect of glucocorticoids and synthetic analogues, initial encounter: Secondary | ICD-10-CM | POA: Diagnosis present

## 2019-03-04 DIAGNOSIS — R0989 Other specified symptoms and signs involving the circulatory and respiratory systems: Secondary | ICD-10-CM

## 2019-03-04 LAB — CBC WITH DIFFERENTIAL/PLATELET
Abs Immature Granulocytes: 0.13 10*3/uL — ABNORMAL HIGH (ref 0.00–0.07)
Basophils Absolute: 0 10*3/uL (ref 0.0–0.1)
Basophils Relative: 0 %
Eosinophils Absolute: 0 10*3/uL (ref 0.0–0.5)
Eosinophils Relative: 0 %
HCT: 47.1 % (ref 39.0–52.0)
Hemoglobin: 15.8 g/dL (ref 13.0–17.0)
Immature Granulocytes: 1 %
Lymphocytes Relative: 12 %
Lymphs Abs: 2.1 10*3/uL (ref 0.7–4.0)
MCH: 30.6 pg (ref 26.0–34.0)
MCHC: 33.5 g/dL (ref 30.0–36.0)
MCV: 91.3 fL (ref 80.0–100.0)
Monocytes Absolute: 0.5 10*3/uL (ref 0.1–1.0)
Monocytes Relative: 3 %
Neutro Abs: 15 10*3/uL — ABNORMAL HIGH (ref 1.7–7.7)
Neutrophils Relative %: 84 %
Platelets: 222 10*3/uL (ref 150–400)
RBC: 5.16 MIL/uL (ref 4.22–5.81)
RDW: 13 % (ref 11.5–15.5)
WBC: 17.8 10*3/uL — ABNORMAL HIGH (ref 4.0–10.5)
nRBC: 0 % (ref 0.0–0.2)

## 2019-03-04 LAB — COMPREHENSIVE METABOLIC PANEL
ALT: 24 U/L (ref 0–44)
AST: 19 U/L (ref 15–41)
Albumin: 4 g/dL (ref 3.5–5.0)
Alkaline Phosphatase: 89 U/L (ref 38–126)
Anion gap: 11 (ref 5–15)
BUN: 17 mg/dL (ref 6–20)
CO2: 23 mmol/L (ref 22–32)
Calcium: 9.6 mg/dL (ref 8.9–10.3)
Chloride: 103 mmol/L (ref 98–111)
Creatinine, Ser: 1.26 mg/dL — ABNORMAL HIGH (ref 0.61–1.24)
GFR calc Af Amer: >60 mL/min (ref >60)
GFR calc non Af Amer: >60 mL/min (ref >60)
Glucose, Bld: 241 mg/dL — ABNORMAL HIGH (ref 70–99)
Potassium: 4 mmol/L (ref 3.5–5.1)
Sodium: 137 mmol/L (ref 135–145)
Total Bilirubin: 0.6 mg/dL (ref 0.3–1.2)
Total Protein: 7.1 g/dL (ref 6.5–8.1)

## 2019-03-04 MED ORDER — FAMOTIDINE 20 MG PO TABS: 20.0000 mg | ORAL_TABLET | Freq: Two times a day (BID) | ORAL | Status: DC

## 2019-03-04 MED ORDER — ACETAMINOPHEN 325 MG PO TABS: 650.0000 mg | ORAL_TABLET | ORAL | Status: DC | PRN

## 2019-03-04 MED ORDER — PROCHLORPERAZINE 25 MG RE SUPP
12.5000 mg | Freq: Four times a day (QID) | RECTAL | Status: DC | PRN
  Filled 2019-03-04: qty 1

## 2019-03-04 MED ORDER — AMLODIPINE BESYLATE 10 MG PO TABS: 10.0000 mg | ORAL_TABLET | Freq: Every day | ORAL | Status: DC

## 2019-03-04 MED ORDER — PROCHLORPERAZINE EDISYLATE 10 MG/2ML IJ SOLN: 5.0000 mg | Freq: Four times a day (QID) | INTRAMUSCULAR | Status: DC | PRN

## 2019-03-04 MED ORDER — AMLODIPINE BESYLATE 10 MG PO TABS
10.0000 mg | ORAL_TABLET | Freq: Every day | ORAL | Status: DC
  Administered 2019-03-05 – 2019-03-20 (×16): 10 mg via ORAL
  Filled 2019-03-04 (×17): qty 1

## 2019-03-04 MED ORDER — METHYLPREDNISOLONE SODIUM SUCC 125 MG IJ SOLR
60.0000 mg | Freq: Four times a day (QID) | INTRAMUSCULAR | Status: AC
  Administered 2019-03-04 (×2): 60 mg via INTRAVENOUS
  Filled 2019-03-04 (×2): qty 2

## 2019-03-04 MED ORDER — ASPIRIN 81 MG PO TBEC: 81.0000 mg | DELAYED_RELEASE_TABLET | Freq: Every day | ORAL | Status: DC

## 2019-03-04 MED ORDER — ASPIRIN EC 81 MG PO TBEC
81.0000 mg | DELAYED_RELEASE_TABLET | Freq: Every day | ORAL | Status: DC
  Administered 2019-03-05 – 2019-03-20 (×16): 81 mg via ORAL
  Filled 2019-03-04 (×16): qty 1

## 2019-03-04 MED ORDER — ATORVASTATIN CALCIUM 80 MG PO TABS: 80.0000 mg | ORAL_TABLET | Freq: Every day | ORAL | Status: DC

## 2019-03-04 MED ORDER — DIPHENHYDRAMINE HCL 25 MG PO TABS: 25.0000 mg | ORAL_TABLET | Freq: Every day | ORAL | 0 refills | Status: DC

## 2019-03-04 MED ORDER — PREDNISONE 10 MG PO TABS: 40.0000 mg | ORAL_TABLET | Freq: Every day | ORAL | 0 refills | Status: DC

## 2019-03-04 MED ORDER — DIPHENHYDRAMINE HCL 12.5 MG/5ML PO ELIX: 12.5000 mg | ORAL_SOLUTION | Freq: Four times a day (QID) | ORAL | Status: DC | PRN

## 2019-03-04 MED ORDER — FAMOTIDINE 20 MG PO TABS
20.0000 mg | ORAL_TABLET | Freq: Two times a day (BID) | ORAL | Status: DC
  Administered 2019-03-04 – 2019-03-20 (×32): 20 mg via ORAL
  Filled 2019-03-04 (×32): qty 1

## 2019-03-04 MED ORDER — ALUM & MAG HYDROXIDE-SIMETH 200-200-20 MG/5ML PO SUSP: 30.0000 mL | ORAL | Status: DC | PRN

## 2019-03-04 MED ORDER — NICOTINE 21 MG/24HR TD PT24: 21.0000 mg | MEDICATED_PATCH | Freq: Every day | TRANSDERMAL | 0 refills | Status: DC

## 2019-03-04 MED ORDER — POLYETHYLENE GLYCOL 3350 17 G PO PACK: 17.0000 g | PACK | Freq: Every day | ORAL | Status: DC | PRN

## 2019-03-04 MED ORDER — CLOPIDOGREL BISULFATE 75 MG PO TABS
75.0000 mg | ORAL_TABLET | Freq: Every day | ORAL | Status: DC
  Administered 2019-03-05 – 2019-03-20 (×16): 75 mg via ORAL
  Filled 2019-03-04 (×16): qty 1

## 2019-03-04 MED ORDER — FLEET ENEMA 7-19 GM/118ML RE ENEM: 1.0000 | ENEMA | Freq: Once | RECTAL | Status: DC | PRN

## 2019-03-04 MED ORDER — NICOTINE 21 MG/24HR TD PT24
21.0000 mg | MEDICATED_PATCH | Freq: Every day | TRANSDERMAL | Status: DC
  Administered 2019-03-04: 21 mg via TRANSDERMAL
  Filled 2019-03-04: qty 1

## 2019-03-04 MED ORDER — ACETAMINOPHEN 325 MG PO TABS: 325.0000 mg | ORAL_TABLET | ORAL | Status: DC | PRN

## 2019-03-04 MED ORDER — ATORVASTATIN CALCIUM 80 MG PO TABS
80.0000 mg | ORAL_TABLET | Freq: Every day | ORAL | Status: DC
  Administered 2019-03-04 – 2019-03-19 (×16): 80 mg via ORAL
  Filled 2019-03-04 (×16): qty 1

## 2019-03-04 MED ORDER — CARVEDILOL 6.25 MG PO TABS
6.2500 mg | ORAL_TABLET | Freq: Two times a day (BID) | ORAL | Status: DC
  Administered 2019-03-04: 09:00:00 6.25 mg via ORAL
  Filled 2019-03-04: qty 1

## 2019-03-04 MED ORDER — CLOPIDOGREL BISULFATE 75 MG PO TABS: 75.0000 mg | ORAL_TABLET | Freq: Every day | ORAL | 0 refills | Status: DC

## 2019-03-04 MED ORDER — FAMOTIDINE 20 MG PO TABS: 20.0000 mg | ORAL_TABLET | Freq: Every day | ORAL | 0 refills | Status: DC

## 2019-03-04 MED ORDER — NICOTINE 21 MG/24HR TD PT24
21.0000 mg | MEDICATED_PATCH | Freq: Every day | TRANSDERMAL | Status: DC
  Administered 2019-03-05 – 2019-03-19 (×15): 21 mg via TRANSDERMAL
  Filled 2019-03-04 (×16): qty 1

## 2019-03-04 MED ORDER — BISACODYL 10 MG RE SUPP: 10.0000 mg | Freq: Every day | RECTAL | Status: DC | PRN

## 2019-03-04 MED ORDER — GUAIFENESIN-DM 100-10 MG/5ML PO SYRP: 5.0000 mL | ORAL_SOLUTION | Freq: Four times a day (QID) | ORAL | Status: DC | PRN

## 2019-03-04 MED ORDER — CARVEDILOL 6.25 MG PO TABS
6.2500 mg | ORAL_TABLET | Freq: Two times a day (BID) | ORAL | Status: DC
  Administered 2019-03-04 – 2019-03-19 (×30): 6.25 mg via ORAL
  Filled 2019-03-04 (×30): qty 1

## 2019-03-04 MED ORDER — ENOXAPARIN SODIUM 40 MG/0.4ML ~~LOC~~ SOLN
40.0000 mg | SUBCUTANEOUS | Status: DC
  Administered 2019-03-04 – 2019-03-19 (×16): 40 mg via SUBCUTANEOUS
  Filled 2019-03-04 (×16): qty 0.4

## 2019-03-04 MED ORDER — PROCHLORPERAZINE MALEATE 5 MG PO TABS: 5.0000 mg | ORAL_TABLET | Freq: Four times a day (QID) | ORAL | Status: DC | PRN

## 2019-03-04 MED ORDER — TRAZODONE HCL 50 MG PO TABS: 25.0000 mg | ORAL_TABLET | Freq: Every evening | ORAL | Status: DC | PRN

## 2019-03-04 MED ORDER — CARVEDILOL 6.25 MG PO TABS: 6.2500 mg | ORAL_TABLET | Freq: Two times a day (BID) | ORAL | Status: DC

## 2019-03-04 MED FILL — NICOTINE 21 MG/24HR PATCH: 21 | 28 days supply | Qty: 28 | Fill #0

## 2019-03-04 MED FILL — predniSONE 10 MG TABS: 10 | 2 days supply | Qty: 8 | Fill #0

## 2019-03-04 NOTE — PMR Pre-admission (Signed)
PMR Admission Coordinator Pre-Admission Assessment  Patient: Casey Petty is an 58 y.o., male MRN: [id number] DOB: 20-Apr-1961 Height: 5' 7" (170.2 cm) Weight: 74.3 kg  Insurance Information  PRIMARY: uninsured      Spoke with financial counselor, Jenny Reichmann, at [phone] and she is arranging disability and medicaid applications  Medicaid Application Date:       Case Manager:  Disability Application Date:       Case Worker:   The "Data Collection Information Summary" for patients in Inpatient Rehabilitation Facilities with attached "Privacy Act [place] Records" was provided and verbally reviewed with: N/A  Emergency Contact Information Contact Information    Name Relation Home Work [place] Significant other   [place] Sister   [phone]      Current Medical History  Patient Admitting Diagnosis: left Lacunar CVA  History of Present Illness: 58 year old male presented 03/01/2019 with history of HTN with complaints of right sided weakness with trouble walking. .Did not receive TPA for late presentation.  MRI L spine as well as brain which demonstrated left lacunar subcortical stroke. Resultant dysarthria and right hemiparesis. Patient was off HTN meds for over 3 years.  TCD unremarkable, carotid doppler unremarkable, 2D echo > 65%, UDS negative. VTE prophylaxis with lovenox. On no antithrombotic pta, now on ASA 81 mg daily and Clopidogrel 75 mg daily. Continue DAPT for 3 weeks and then ASA alone.  On Amlodipine. Lisinopril added  And then on 7/26 noted angioedema of right face and lip therefore lisinopril discontinued. Swelling improved on 7/27 day of admission to CIR.Marland Kitchen Started on coreg, and continue on amlodipine and plans to titrate on rehab as needed. Prednisone for swelling and plans to continue for 2 days.Mild leukocytosis felt likely due to steroids. No signs of infection.  Lipitor 80 mg daily and continue statin at  discharge.  Will add nicotine patch 21 mg daily. Smoking cessation discussed.   Complete NIHSS TOTAL: 8  Patient's medical record from [facility]  has been reviewed by the rehabilitation admission coordinator and physician.  Past Medical History  Past Medical History:  Diagnosis Date  . Hypertension     Family History   family history includes Heart attack in his father.  Prior Rehab/Hospitalizations Has the patient had prior rehab or hospitalizations prior to admission? Yes  Has the patient had major surgery during 100 days prior to admission? No   Current Medications  Current Facility-Administered Medications:  .  acetaminophen (TYLENOL) tablet 650 mg, 650 mg, Oral, Q4H PRN, 650 mg at 03/03/19 0842 **OR** acetaminophen (TYLENOL) solution 650 mg, 650 mg, Per Tube, Q4H PRN **OR** acetaminophen (TYLENOL) suppository 650 mg, 650 mg, Rectal, Q4H PRN, Jani Gravel, MD .  amLODipine (NORVASC) tablet 10 mg, 10 mg, Oral, Daily, Jani Gravel, MD, 10 mg at 03/04/19 0841 .  aspirin EC tablet 81 mg, 81 mg, Oral, Daily, Rosalin Hawking, MD, 81 mg at 03/04/19 0841 .  atorvastatin (LIPITOR) tablet 80 mg, 80 mg, Oral, q1800, Jani Gravel, MD, 80 mg at 03/03/19 2029 .  carvedilol (COREG) tablet 6.25 mg, 6.25 mg, Oral, BID WC, Buriev, Arie Sabina, MD, 6.25 mg at 03/04/19 0841 .  clopidogrel (PLAVIX) tablet 75 mg, 75 mg, Oral, Daily, Greta Doom, MD, 75 mg at 03/04/19 0840 .  diphenhydrAMINE (BENADRYL) injection 25 mg, 25 mg, Intravenous, Q6H, Lavina Hamman, MD, 25 mg at 03/04/19 0124 .  famotidine (PEPCID) tablet 20 mg, 20 mg, Oral, BID, Buriev, Ulugbek  N, MD .  methylPREDNISolone sodium succinate (SOLU-MEDROL) 125 mg/2 mL injection 60 mg, 60 mg, Intravenous, Q6H, Lavina Hamman, MD, 60 mg at 03/04/19 0124 .  nicotine (NICODERM CQ - dosed in mg/24 hours) patch 21 mg, 21 mg, Transdermal, Daily, Buriev, Arie Sabina, MD  Patients Current Diet:  Diet Order            Diet - low sodium  heart healthy        Diet full liquid Room service appropriate? Yes; Fluid consistency: Thin  Diet effective now              Precautions / Restrictions Precautions Precautions: Fall Restrictions Weight Bearing Restrictions: No   Has the patient had 2 or more falls or a fall with injury in the past year? 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 Independent  Indoor Mobility: Did the patient need assistance with walking from room to room (with or without device)? Independent  Stairs: Did the patient need assistance with internal or external stairs (with or without device)? Independent  Functional Cognition: Did the patient need help planning regular tasks such as shopping or remembering to take medications? Independent  Home Assistive Devices / Equipment Home Assistive Devices/Equipment: None Home Equipment: Grab bars - tub/shower  Prior Device Use: Indicate devices/aids used by the patient prior to current illness, exacerbation or injury? None of the above  Current Functional Level Cognition  Arousal/Alertness: Awake/alert Overall Cognitive Status: No family/caregiver present to determine baseline cognitive functioning Orientation Level: Oriented X4 Attention: Focused, Sustained Focused Attention: Appears intact Sustained Attention: Appears intact Memory: Appears intact(Immediate: 3/3; delayed: 3/3) Awareness: Appears intact Problem Solving: Impaired Problem Solving Impairment: Verbal complex    Extremity Assessment (includes Sensation/Coordination)  Upper Extremity Assessment: Defer to OT evaluation RUE Deficits / Details: increased tone, he can move arm with greatly increased effort and not full ROM RUE Coordination: decreased fine motor, decreased gross motor  Lower Extremity Assessment: RLE  deficits/detail RLE Deficits / Details: increased tone, reduced weight shift to R LE - buckle noted; does not lift LE throughout full ROM against gravity  RLE Coordination: decreased fine motor, decreased gross motor    ADLs  Overall ADL's : Needs assistance/impaired Eating/Feeding: Set up, Sitting Grooming: Moderate assistance, Sitting Upper Body Bathing: Moderate assistance, Sitting Lower Body Bathing: Moderate assistance Lower Body Bathing Details (indicate cue type and reason): +2 min A sit<>stand Upper Body Dressing : Moderate assistance, Sitting Lower Body Dressing: Maximal assistance Lower Body Dressing Details (indicate cue type and reason): +2 min A sit<>stand Toilet Transfer: Moderate assistance, +2 for physical assistance, Stand-pivot Toilet Transfer Details (indicate cue type and reason): bed>recliner going to pt's right Toileting- Clothing Manipulation and Hygiene: Maximal assistance Toileting - Clothing Manipulation Details (indicate cue type and reason): +2 min A sit<>stand    Mobility  Overal bed mobility: Needs Assistance Bed Mobility: Supine to Sit Supine to sit: Min guard General bed mobility comments: pt able to power himself up to EOB using tone on right  in his favor    Transfers  Overall transfer level: Needs assistance Equipment used: 2 person hand held assist Transfers: Sit to/from Stand, Stand Pivot Transfers Sit to Stand: Min assist, +2 physical assistance Stand pivot transfers: Mod assist, +2 physical assistance General transfer comment: Pt needed A to block his right knee when he stepped with  LLE otherwise RLE buckled    Ambulation / Gait / Stairs / Wheelchair Mobility  Ambulation/Gait General Gait Details: deferred    Posture / Balance Balance Overall balance assessment: Needs assistance Sitting-balance support: No upper extremity supported Sitting balance-Leahy Scale: Fair Standing balance support: Bilateral upper extremity supported, During  functional activity Standing balance-Leahy Scale: Poor    Special needs/care consideration BiPAP/CPAP  N/a CPM  N/a Continuous Drip IV  N/a Dialysis n/a Life Vest  N/a Oxygen  N/a Special Bed  N/a Trach Size  N/a Wound Vac n/a Skin intact Bowel mgmt:  Continent LBM 7/26 Bladder mgmt:  External catheter Diabetic mgmt: Hgb A1c not tested Behavioral consideration  N/a Chemo/radiation  N/a   Previous Home Environment  Living Arrangements: Spouse/significant other  Lives With: Significant other Available Help at Discharge: Family, Available 24 hours/day Type of Home: Apartment Home Layout: (one level but on second floor) Home Access: Stairs to enter Entrance Stairs-Rails: Right Entrance Stairs-Number of Steps: 10 Bathroom Shower/Tub: Tub/shower unit, Architectural technologist: Standard Bathroom Accessibility: Yes How Accessible: Accessible via walker [place]: No  Discharge Living Setting Plans for Discharge Living Setting: Patient's home, Apartment(girlfriend, Casey Petty) Type of Home at Discharge: Apartment Discharge Home Layout: One level(but on second floord) Discharge Home Access: Stairs to enter Entrance Stairs-Rails: Right Entrance Stairs-Number of Steps: 10 Discharge Bathroom Shower/Tub: Tub/shower unit, Curtain Discharge Bathroom Toilet: Standard Discharge Bathroom Accessibility: Yes How Accessible: Accessible via walker Does the patient have any problems obtaining your medications?: Yes (Describe)(no insurance)  Social/Family/Support Systems Patient Roles: Parent, Partner(has four adult children) Contact Information: Casey Petty, girlfriend Anticipated Caregiver: Casey Petty Anticipated Ambulance person Information: see above Ability/Limitations of Caregiver: no limitations Caregiver Availability: 24/7 Discharge Plan Discussed with Primary Caregiver: Yes Is Caregiver In Agreement with Plan?: Yes Does Caregiver/Family have Issues with Lodging/Transportation  while Pt is in Rehab?: No  Goals/Additional Needs Patient/Family Goal for Rehab: Mod I to supervision with PT, OT, and SLP Expected length of stay: ELOS 7 to 10 days Pt/Family Agrees to Admission and willing to participate: Yes Program Orientation Provided & Reviewed with Pt/Caregiver Including Roles  & Responsibilities: Yes  Decrease burden of Care through IP rehab admission: n/a  Possible need for SNF placement upon discharge:  Not anticipated  Patient Condition: I have reviewed medical records from [facility]  spoken with CSW, and patient and spouse. I met with patient at the bedside for inpatient rehabilitation assessment.  Patient will benefit from ongoing PT, OT and SLP, can actively participate in 3 hours of therapy a day 5 days of the week, and can make measurable gains during the admission.  Patient will also benefit from the coordinated team approach during an Inpatient Acute Rehabilitation admission.  The patient will receive intensive therapy as well as Rehabilitation physician, nursing, social worker, and care management interventions.  Due to bladder management, bowel management, safety, skin/wound care, disease management, medication administration, pain management and patient education the patient requires 24 hour a day rehabilitation nursing.  The patient is currently mod assist with mobility and basic ADLs.  Discharge setting and therapy post discharge at home with home health is anticipated.  Patient has agreed to participate in the Acute Inpatient Rehabilitation Program and will admit today.  Preadmission Screen Completed By:  Cleatrice Burke, 03/04/2019 11:22 AM ______________________________________________________________________   Discussed status with Dr. Naaman Plummer  on  03/04/2019 at 1125 and received approval for admission today.  Admission Coordinator:  Cleatrice Burke, RN, time  6073 Date  03/04/2019   Assessment/Plan: Diagnosis: left lacunar  subcortical cva 1. Does the need for close, 24 hr/day Medical supervision in concert with the patient's rehab needs make it unreasonable for this patient to be served in a less intensive setting? Yes 2. Co-Morbidities requiring supervision/potential complications: HTN 3. Due to bladder management, bowel management, safety, skin/wound care, disease management, medication administration, pain management and patient education, does the patient require 24 hr/day rehab nursing? Yes 4. Does the patient require coordinated care of a physician, rehab nurse, PT (1-2 hrs/day, 5 days/week), OT (1-2 hrs/day, 5 days/week) and SLP (1-2 hrs/day, 5 days/week) to address physical and functional deficits in the context of the above medical diagnosis(es)? Yes Addressing deficits in the following areas: balance, endurance, locomotion, strength, transferring, bowel/bladder control, bathing, dressing, feeding, grooming, toileting, cognition, speech and psychosocial support 5. Can the patient actively participate in an intensive therapy program of at least 3 hrs of therapy 5 days a week? Yes 6. The potential for patient to make measurable gains while on inpatient rehab is excellent 7. Anticipated functional outcomes upon discharge from inpatients are: modified independent and supervision PT, modified independent and supervision OT, modified independent and supervision SLP 8. Estimated rehab length of stay to reach the above functional goals is: 7-10 days 9. Anticipated D/C setting: Home 10. Anticipated post D/C treatments: [place] therapy 11. Overall Rehab/Functional Prognosis: excellent  MD Signature: Meredith Staggers, MD, [place] [facility] [facility] 03/04/2019

## 2019-03-04 NOTE — H&P (Signed)
Physical Medicine and Rehabilitation Admission H&P         Chief Complaint  Patient presents with  . Functional deficits due to       Stroke with right sided weakness      HPI: Casey Petty is a 58 year old male with history of HTN--no meds X 3 years who was admitted on 03/01/2019 with 2-day history of right-sided weakness with RLE pain and difficulty walking and slurred speech. Blood pressure elevated at admission and UDS negative.  MRI lumbar spine showed moderate bilateral L5 foraminal stenosis and mild to moderate facet arthropathy more pronounced at L4-L5.  MRI brain done revealing 13 mm acute ischemic infarct left internal capsule and possible punctate acute/early subacute ischemic in infarct contralateral right internal capsule. 2 D echo done revealing mild concentric LVH with EF > 65% and no shunt. Carotid dopplers were negative for significant ICA stenosis. Dr. [name] felt that stroke was due to small vessel disease and recommended ASA/Plavix for 3 weeks followed by ASA alone.  Blood pressures continue to be labile and medications being titrated. He developed angioedema with addition of Lisinopril therefore this was discontinued and he received IV solumedrol with improvement. Patient with anarthria/dysarthria with mild receptive deficits and left sided weakness affecting mobility and ADLs. CIR recommended due to functional decline.     Review of Systems  Constitutional: Negative for chills and fever.  HENT: Negative for hearing loss and tinnitus.   Eyes: Negative for blurred vision and double vision.  Respiratory: Negative for cough and hemoptysis.   Cardiovascular: Negative for chest pain and palpitations.  Gastrointestinal: Negative for constipation, heartburn and nausea.  Genitourinary: Negative for dysuria and urgency.  Musculoskeletal: Negative for back pain, myalgias and neck pain.  Skin: Negative for itching and rash.  Neurological: Positive for sensory change, speech  change and focal weakness. Negative for dizziness and headaches.  Psychiatric/Behavioral: The patient does not have insomnia.             Past Medical History:  Diagnosis Date  . Hypertension       History reviewed. No pertinent surgical history.           Family History  Problem Relation Age of Onset  . Heart attack Father       Social History: Single. 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 He reports that he has been smoking--4 cigarettes daily. He has never used smokeless tobacco. He reports current alcohol use of about 2.0 standard drinks of alcohol per week. He reports that he does not use drugs.          Allergies  Allergen Reactions  . Lisinopril Swelling      Angioedema           Medications Prior to Admission  Medication Sig Dispense Refill  . amLODipine (NORVASC) 10 MG tablet Take 1 tablet (10 mg total) by mouth daily. (Patient not taking: Reported on 02/28/2019) 30 tablet 0  . methocarbamol (ROBAXIN) 500 MG tablet Take 1 tablet (500 mg total) by mouth 3 (three) times daily between meals as needed. (Patient not taking: Reported on 02/28/2019) 20 tablet 0  . naproxen (NAPROSYN) 500 MG tablet Take 1 tablet (500 mg total) by mouth 2 (two) times daily. (Patient not taking: Reported on 02/28/2019) 30 tablet 0  . traMADol (ULTRAM) 50 MG tablet Take 1 tablet (50 mg total) by mouth every 6 (six) hours as needed. (Patient not taking: Reported on 02/28/2019) 10  tablet 0     Drug Regimen Review  Drug regimen was reviewed and remains appropriate with no significant issues identified   Home: Home Living Family/patient expects to be discharged to:: Private residence Living Arrangements: Spouse/significant other Available Help at Discharge: Family, Available 24 hours/day Type of Home: Apartment Home Access: Stairs to enter Technical brewer of Steps: 10 Entrance Stairs-Rails: Right Home Layout: One level Bathroom Shower/Tub: Tub/shower unit, Artist: Standard Home Equipment: Grab bars - tub/shower  Lives With: Significant other   Functional History: Prior Function Level of Independence: Independent Comments: traffic controller on highway   Functional Status:  Mobility: Bed Mobility Overal bed mobility: Needs Assistance Bed Mobility: Supine to Sit Supine to sit: Min guard General bed mobility comments: pt able to power himself up to EOB using tone on right  in his favor Transfers Overall transfer level: Needs assistance Equipment used: 2 person hand held assist Transfers: Sit to/from Stand, Stand Pivot Transfers Sit to Stand: Min assist, +2 physical assistance Stand pivot transfers: Mod assist, +2 physical assistance General transfer comment: Pt needed A to block his right knee when he stepped with LLE otherwise RLE buckled Ambulation/Gait General Gait Details: deferred     ADL: ADL Overall ADL's : Needs assistance/impaired Eating/Feeding: Set up, Sitting Grooming: Moderate assistance, Sitting Upper Body Bathing: Moderate assistance, Sitting Lower Body Bathing: Moderate assistance Lower Body Bathing Details (indicate cue type and reason): +2 min A sit<>stand Upper Body Dressing : Moderate assistance, Sitting Lower Body Dressing: Maximal assistance Lower Body Dressing Details (indicate cue type and reason): +2 min A sit<>stand Toilet Transfer: Moderate assistance, +2 for physical assistance, Stand-pivot Toilet Transfer Details (indicate cue type and reason): bed>recliner going to pt's right Toileting- Clothing Manipulation and Hygiene: Maximal assistance Toileting - Clothing Manipulation Details (indicate cue type and reason): +2 min A sit<>stand   Cognition: Cognition Overall Cognitive Status: No family/caregiver present to determine baseline cognitive functioning Arousal/Alertness: Awake/alert Orientation Level: Oriented X4 Attention: Focused, Sustained Focused Attention: Appears intact  Sustained Attention: Appears intact Memory: Appears intact(Immediate: 3/3; delayed: 3/3) Awareness: Appears intact Problem Solving: Impaired Problem Solving Impairment: Verbal complex Cognition Arousal/Alertness: Awake/alert Behavior During Therapy: Impulsive Overall Cognitive Status: No family/caregiver present to determine baseline cognitive functioning     Blood pressure (!) 167/86, pulse 87, temperature 97.6 F (36.4 C), temperature source Oral, resp. rate 19, height 5\' 7"  (1.702 m), weight 74.3 kg, SpO2 98 %. Physical Exam  Nursing note and vitals reviewed. Constitutional: He is oriented to person, place, and time. He appears well-developed and well-nourished. No distress.  HENT:  Head: Normocephalic and atraumatic.  Eyes: Pupils are equal, round, and reactive to light. EOM are normal.  Neck: Normal range of motion. No tracheal deviation present. No thyromegaly present.  Cardiovascular: Normal rate. Exam reveals no friction rub.  No murmur heard. Respiratory: Effort normal. No respiratory distress. He has no wheezes. He has no rales.  GI: Soft. He exhibits no distension. There is no abdominal tenderness.  Musculoskeletal:        General: Edema (tr RUE RLE) present.  Neurological: He is alert and oriented to person, place, and time.  Right facial weakness with moderate dysarthric speech. Impulsive and easily frustrated. Normal language.  Fair awareness. RUE 2- deltoid, biceps, 1+ tricep, 0 WE,HI. RLE: 1+ to 2/5 HE, KE and 0/5 ADF/PF. Decreased LT and pain sense RUE and RLE. DTR's 1+  Skin: Skin is warm.  Psychiatric:  Sl flat, cooperative  Lab Results Last 48 Hours        Results for orders placed or performed during the hospital encounter of 02/28/19 (from the past 48 hour(s))  CBC with Differential/Platelet     Status: Abnormal    Collection Time: 03/04/19  5:49 AM  Result Value Ref Range    WBC 17.8 (H) 4.0 - 10.5 K/uL    RBC 5.16 4.22 - 5.81 MIL/uL    Hemoglobin  15.8 13.0 - 17.0 g/dL    HCT 47.1 39.0 - 52.0 %    MCV 91.3 80.0 - 100.0 fL    MCH 30.6 26.0 - 34.0 pg    MCHC 33.5 30.0 - 36.0 g/dL    RDW 13.0 11.5 - 15.5 %    Platelets 222 150 - 400 K/uL    nRBC 0.0 0.0 - 0.2 %    Neutrophils Relative % 84 %    Neutro Abs 15.0 (H) 1.7 - 7.7 K/uL    Lymphocytes Relative 12 %    Lymphs Abs 2.1 0.7 - 4.0 K/uL    Monocytes Relative 3 %    Monocytes Absolute 0.5 0.1 - 1.0 K/uL    Eosinophils Relative 0 %    Eosinophils Absolute 0.0 0.0 - 0.5 K/uL    Basophils Relative 0 %    Basophils Absolute 0.0 0.0 - 0.1 K/uL    Immature Granulocytes 1 %    Abs Immature Granulocytes 0.13 (H) 0.00 - 0.07 K/uL      Comment: Performed at [facility], [address] [place], [place] [address]  Comprehensive metabolic panel     Status: Abnormal    Collection Time: 03/04/19  5:49 AM  Result Value Ref Range    Sodium 137 135 - 145 mmol/L    Potassium 4.0 3.5 - 5.1 mmol/L    Chloride 103 98 - 111 mmol/L    CO2 23 22 - 32 mmol/L    Glucose, Bld 241 (H) 70 - 99 mg/dL    BUN 17 6 - 20 mg/dL    Creatinine, Ser 1.26 (H) 0.61 - 1.24 mg/dL    Calcium 9.6 8.9 - 10.3 mg/dL    Total Protein 7.1 6.5 - 8.1 g/dL    Albumin 4.0 3.5 - 5.0 g/dL    AST 19 15 - 41 U/L    ALT 24 0 - 44 U/L    Alkaline Phosphatase 89 38 - 126 U/L    Total Bilirubin 0.6 0.3 - 1.2 mg/dL    GFR calc non Af Amer >60 >60 mL/min    GFR calc Af Amer >60 >60 mL/min    Anion gap 11 5 - 15      Comment: Performed at [facility] [address], [place], [place] [address]     Imaging Results (Last 48 hours)  No results found.           Medical Problem List and Plan: 1.  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              -admit to inpatient rehab 2.  Antithrombotics: -DVT/anticoagulation:  Pharmaceutical: Lovenox             -antiplatelet therapy: ASA/Plavix x3 weeks followed by ASA alone 3. Pain Management: N/A 4. Mood: LCSW to  follow for evaluation and support             -antipsychotic agents: N/A  5. Neuropsych: This patient is capable of making  decisions on his own behalf. 6. Skin/Wound Care: Routine pressure re ief measures.  Maintain adequate nutrition and hydration status 7. Fluids/Electrolytes/Nutrition: Monitor I/O. Check lytes in am. 8. HTN: Monitor BP bid. Continue Norvasc,  9. Dyslipidemia: On Lipitor.  10. Acute angioedema (face/lips): Resolved with d/c ACE and IV solumedrol--2 more dose to complete per recs. Will resume regular texture foods.   11.Leucocytosis: Likely due to steroids. Monitor for now.     Post Admission Physician Evaluation: 1. Functional deficits secondary  to left subcortical infarct. 2. Patient is admitted to receive collaborative, interdisciplinary care between the physiatrist, rehab nursing staff, and therapy team. 3. Patient's level of medical complexity and substantial therapy needs in context of that medical necessity cannot be provided at a lesser intensity of care such as a SNF. 4. Patient has experienced substantial functional loss from his/her baseline which was documented above under the "Functional History" and "Functional Status" headings.  Judging by the patient's diagnosis, physical exam, and functional history, the patient has potential for functional progress which will result in measurable gains while on inpatient rehab.  These gains will be of substantial and practical use upon discharge  in facilitating mobility and self-care at the household level. 5. Physiatrist will provide 24 hour management of medical needs as well as oversight of the therapy plan/treatment and provide guidance as appropriate regarding the interaction of the two. 6. The Preadmission Screening has been reviewed and patient status is unchanged unless otherwise stated above. 7. 24 hour rehab nursing will assist with bladder management, bowel management, safety, skin/wound care, disease management,  medication administration, pain management and patient education  and help integrate therapy concepts, techniques,education, etc. 8. PT will assess and treat for/with: Lower extremity strength, range of motion, stamina, balance, functional mobility, safety, adaptive techniques and equipment.   Goals are: mod I to supefvision. 9. OT will assess and treat for/with: ADL's, functional mobility, safety, upper extremity strength, adaptive techniques and equipment, Goals are: mod I to supervision. Therapy may proceed with showering this patient. 10. SLP will assess and treat for/with: communication, speech.  Goals are: mod I to supervision. 11. Case Management and Social Worker will assess and treat for psychological issues and discharge planning. 12. Team conference will be held weekly to assess progress toward goals and to determine barriers to discharge. 13. Patient will receive at least 3 hours of therapy per day at least 5 days per week. 14. ELOS: 7-10 days       15. Prognosis:  excellent   Meredith Staggers, MD, [facility] 03/04/2019     [name], PA-C 03/04/2019

## 2019-03-04 NOTE — Progress Notes (Signed)
Orthopedic Tech Progress Note Patient Details:  Casey Petty 1961-07-23 [id number]  Patient ID: Casey Petty, male   DOB: 1960-11-18, 58 y.o.   MRN: [id number]   Casey Petty 03/04/2019, 2:37 PMright resting hand splint and Prafo boot.

## 2019-03-04 NOTE — H&P (Signed)
Physical Medicine and Rehabilitation Admission H&P    Chief Complaint  Patient presents with  . Functional deficits due to     Stroke with right sided weakness    HPI: Casey Petty is a 58 year old male with history of HTN--no meds X 3 years who was admitted on 03/01/2019 with 2-day history of right-sided weakness with RLE pain and difficulty walking and slurred speech. Blood pressure elevated at admission and UDS negative.  MRI lumbar spine showed moderate bilateral L5 foraminal stenosis and mild to moderate facet arthropathy more pronounced at L4-L5.  MRI brain done revealing 13 mm acute ischemic infarct left internal capsule and possible punctate acute/early subacute ischemic in infarct contralateral right internal capsule. 2 D echo done revealing mild concentric LVH with EF > 65% and no shunt. Carotid dopplers were negative for significant ICA stenosis. Dr. [name] felt that stroke was due to small vessel disease and recommended ASA/Plavix for 3 weeks followed by ASA alone.  Blood pressures continue to be labile and medications being titrated. He developed angioedema with addition of Lisinopril therefore this was discontinued and he received IV solumedrol with improvement. Patient with anarthria/dysarthria with mild receptive deficits and left sided weakness affecting mobility and ADLs. CIR recommended due to functional decline.   Review of Systems  Constitutional: Negative for chills and fever.  HENT: Negative for hearing loss and tinnitus.   Eyes: Negative for blurred vision and double vision.  Respiratory: Negative for cough and hemoptysis.   Cardiovascular: Negative for chest pain and palpitations.  Gastrointestinal: Negative for constipation, heartburn and nausea.  Genitourinary: Negative for dysuria and urgency.  Musculoskeletal: Negative for back pain, myalgias and neck pain.  Skin: Negative for itching and rash.  Neurological: Positive for sensory change, speech change and  focal weakness. Negative for dizziness and headaches.  Psychiatric/Behavioral: The patient does not have insomnia.       Past Medical History:  Diagnosis Date  . Hypertension     History reviewed. No pertinent surgical history.    Family History  Problem Relation Age of Onset  . Heart attack Father     Social History: Single. 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 He reports that he has been smoking--4 cigarettes daily. He has never used smokeless tobacco. He reports current alcohol use of about 2.0 standard drinks of alcohol per week. He reports that he does not use drugs.   Allergies  Allergen Reactions  . Lisinopril Swelling    Angioedema    Medications Prior to Admission  Medication Sig Dispense Refill  . amLODipine (NORVASC) 10 MG tablet Take 1 tablet (10 mg total) by mouth daily. (Patient not taking: Reported on 02/28/2019) 30 tablet 0  . methocarbamol (ROBAXIN) 500 MG tablet Take 1 tablet (500 mg total) by mouth 3 (three) times daily between meals as needed. (Patient not taking: Reported on 02/28/2019) 20 tablet 0  . naproxen (NAPROSYN) 500 MG tablet Take 1 tablet (500 mg total) by mouth 2 (two) times daily. (Patient not taking: Reported on 02/28/2019) 30 tablet 0  . traMADol (ULTRAM) 50 MG tablet Take 1 tablet (50 mg total) by mouth every 6 (six) hours as needed. (Patient not taking: Reported on 02/28/2019) 10 tablet 0    Drug Regimen Review  Drug regimen was reviewed and remains appropriate with no significant issues identified  Home: Home Living Family/patient expects to be discharged to:: Private residence Living Arrangements: Spouse/significant other Available Help at Discharge: Family, Available 24 hours/day Type  of Home: Apartment Home Access: Stairs to enter [employer] of Steps: 10 Entrance Stairs-Rails: Right Home Layout: One level Bathroom Shower/Tub: Tub/shower unit, Architectural technologist: Standard Home Equipment: Grab bars  - tub/shower  Lives With: Significant other   Functional History: Prior Function Level of Independence: Independent Comments: traffic controller on highway  Functional Status:  Mobility: Bed Mobility Overal bed mobility: Needs Assistance Bed Mobility: Supine to Sit Supine to sit: Min guard General bed mobility comments: pt able to power himself up to EOB using tone on right  in his favor Transfers Overall transfer level: Needs assistance Equipment used: 2 person hand held assist Transfers: Sit to/from Stand, Stand Pivot Transfers Sit to Stand: Min assist, +2 physical assistance Stand pivot transfers: Mod assist, +2 physical assistance General transfer comment: Pt needed A to block his right knee when he stepped with LLE otherwise RLE buckled Ambulation/Gait General Gait Details: deferred    ADL: ADL Overall ADL's : Needs assistance/impaired Eating/Feeding: Set up, Sitting Grooming: Moderate assistance, Sitting Upper Body Bathing: Moderate assistance, Sitting Lower Body Bathing: Moderate assistance Lower Body Bathing Details (indicate cue type and reason): +2 min A sit<>stand Upper Body Dressing : Moderate assistance, Sitting Lower Body Dressing: Maximal assistance Lower Body Dressing Details (indicate cue type and reason): +2 min A sit<>stand Toilet Transfer: Moderate assistance, +2 for physical assistance, Stand-pivot Toilet Transfer Details (indicate cue type and reason): bed>recliner going to pt's right Toileting- Clothing Manipulation and Hygiene: Maximal assistance Toileting - Clothing Manipulation Details (indicate cue type and reason): +2 min A sit<>stand  Cognition: Cognition Overall Cognitive Status: No family/caregiver present to determine baseline cognitive functioning Arousal/Alertness: Awake/alert Orientation Level: Oriented X4 Attention: Focused, Sustained Focused Attention: Appears intact Sustained Attention: Appears intact Memory: Appears  intact(Immediate: 3/3; delayed: 3/3) Awareness: Appears intact Problem Solving: Impaired Problem Solving Impairment: Verbal complex Cognition Arousal/Alertness: Awake/alert Behavior During Therapy: Impulsive Overall Cognitive Status: No family/caregiver present to determine baseline cognitive functioning   Blood pressure (!) 167/86, pulse 87, temperature 97.6 F (36.4 C), temperature source Oral, resp. rate 19, height 5\' 7"  (1.702 m), weight 74.3 kg, SpO2 98 %. Physical Exam  Nursing note and vitals reviewed. Constitutional: He is oriented to person, place, and time. He appears well-developed and well-nourished. No distress.  HENT:  Head: Normocephalic and atraumatic.  Eyes: Pupils are equal, round, and reactive to light. EOM are normal.  Neck: Normal range of motion. No tracheal deviation present. No thyromegaly present.  Cardiovascular: Normal rate. Exam reveals no friction rub.  No murmur heard. Respiratory: Effort normal. No respiratory distress. He has no wheezes. He has no rales.  GI: Soft. He exhibits no distension. There is no abdominal tenderness.  Musculoskeletal:        General: Edema (tr RUE RLE) present.  Neurological: He is alert and oriented to person, place, and time.  Right facial weakness with moderate dysarthric speech. Impulsive and easily frustrated. Normal language.  Fair awareness. RUE 2- deltoid, biceps, 1+ tricep, 0 WE,HI. RLE: 1+ to 2/5 HE, KE and 0/5 ADF/PF. Decreased LT and pain sense RUE and RLE. DTR's 1+  Skin: Skin is warm.  Psychiatric:  Sl flat, cooperative    Results for orders placed or performed during the hospital encounter of 02/28/19 (from the past 48 hour(s))  CBC with Differential/Platelet     Status: Abnormal   Collection Time: 03/04/19  5:49 AM  Result Value Ref Range   WBC 17.8 (H) 4.0 - 10.5 K/uL   RBC 5.16 4.22 -  5.81 MIL/uL   Hemoglobin 15.8 13.0 - 17.0 g/dL   HCT 47.1 39.0 - 52.0 %   MCV 91.3 80.0 - 100.0 fL   MCH 30.6 26.0 -  34.0 pg   MCHC 33.5 30.0 - 36.0 g/dL   RDW 13.0 11.5 - 15.5 %   Platelets 222 150 - 400 K/uL   nRBC 0.0 0.0 - 0.2 %   Neutrophils Relative % 84 %   Neutro Abs 15.0 (H) 1.7 - 7.7 K/uL   Lymphocytes Relative 12 %   Lymphs Abs 2.1 0.7 - 4.0 K/uL   Monocytes Relative 3 %   Monocytes Absolute 0.5 0.1 - 1.0 K/uL   Eosinophils Relative 0 %   Eosinophils Absolute 0.0 0.0 - 0.5 K/uL   Basophils Relative 0 %   Basophils Absolute 0.0 0.0 - 0.1 K/uL   Immature Granulocytes 1 %   Abs Immature Granulocytes 0.13 (H) 0.00 - 0.07 K/uL    Comment: Performed at [facility] [address], [place], [place] [address]  Comprehensive metabolic panel     Status: Abnormal   Collection Time: 03/04/19  5:49 AM  Result Value Ref Range   Sodium 137 135 - 145 mmol/L   Potassium 4.0 3.5 - 5.1 mmol/L   Chloride 103 98 - 111 mmol/L   CO2 23 22 - 32 mmol/L   Glucose, Bld 241 (H) 70 - 99 mg/dL   BUN 17 6 - 20 mg/dL   Creatinine, Ser 1.26 (H) 0.61 - 1.24 mg/dL   Calcium 9.6 8.9 - 10.3 mg/dL   Total Protein 7.1 6.5 - 8.1 g/dL   Albumin 4.0 3.5 - 5.0 g/dL   AST 19 15 - 41 U/L   ALT 24 0 - 44 U/L   Alkaline Phosphatase 89 38 - 126 U/L   Total Bilirubin 0.6 0.3 - 1.2 mg/dL   GFR calc non Af Amer >60 >60 mL/min   GFR calc Af Amer >60 >60 mL/min   Anion gap 11 5 - 15    Comment: Performed at [facility] [address] [place], [place] [address]   No results found.     Medical Problem List and Plan: 1.  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   -admit to inpatient rehab 2.  Antithrombotics: -DVT/anticoagulation:  Pharmaceutical: Lovenox  -antiplatelet therapy: ASA/Plavix x3 weeks followed by ASA alone 3. Pain Management: N/A 4. Mood: LCSW to follow for evaluation and support  -antipsychotic agents: N/A  5. Neuropsych: This patient is capable of making decisions on his own behalf. 6. Skin/Wound Care: Routine pressure re ief measures.   Maintain adequate nutrition and hydration status 7. Fluids/Electrolytes/Nutrition: Monitor I/O. Check lytes in am. 8. HTN: Monitor BP bid. Continue Norvasc,  9. Dyslipidemia: On Lipitor.  10. Acute angioedema (face/lips): Resolved with d/c ACE and IV solumedrol--2 more dose to complete per recs. Will resume regular texture foods.   11.Leucocytosis: Likely due to steroids. Monitor for now.       [name], PA-C 03/04/2019

## 2019-03-04 NOTE — Discharge Summary (Addendum)
Physician Discharge Summary  Casey Petty UYQ:[id number] DOB: 1961/06/16 DOA: 02/28/2019  PCP: Patient, [name]  Admit date: 02/28/2019 Discharge date: 03/04/2019  Time spent: >35 minutes  Recommendations for Outpatient Follow-up:  Neurology  In 1-2 weeks after discharge  PCP in 3-4 days  MD at the rehab facility  Discharge Diagnoses:  Principal Problem:   Stroke [facility]) Active Problems:   Essential hypertension   Tobacco abuse   Hyperglycemia   Discharge Condition: stable   Diet recommendation: low sodium   Filed Weights   02/28/19 2018 03/01/19 0321  Weight: 74.8 kg 74.3 kg    History of present illness:   58 y.o.male,w hypertension, presents with c/o right leg pain/weakness starting 5pm Wednesday. Pt denies numbness, tingling. Pt denies headache, vision change, slurred speech, cp, palp, sob, n/v, abd pain, diarrhea, brbpr, dysuria, hematuria. Pt is not currently on bp medication. He lives with his wife.  Patient was admitted with right-sided weakness secondary to acute lacunar subcortical CVA on the left side. He has been started on dual antiplatelet therapy with carotid Dopplers, echocardiogram, and PT/OT evaluation. Neurology followed.  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 Felt to be likely secondary to poor control hypertension and tobacco abuse. Patient was off BP medications for over 3 years. MRI shows61mm acute ischemic infarct involving the posterior limb of the left internal capsule. Resultant dysarthria and right hemiparesis. Echo shows LVEF 65%, A1c, LDL PT recommending CIR. Continue DAPT for 3 weeks then ASA along.dispo:P rehab   Hypertensionurgency. Permissive hypertension was allowed, discussed with neurology and recommended to start normalizing blood pressure slowly. Patient currently on amlodipine 10 mg. BP in the high and, neurology added lisinopril 20 mg daily. then developed mild angioedema. No  stridor, no change in voice but mild lip, facial swelling. Improved. Due to angioedema will discontinue lisinopril. Started on coreg, cont amlodipine, and titrate as needed at rehab   Acute angioedema secondary to lisinopril. Improved. Medication discontinued. No crowding of pharynx or stridor on examination. Cont  prednisone for 2 days,. H1/h2 blockers   Hyperlipidemia LDL goal less than 70. Patient currently on Lipitor 80 mg daily. Continue at discharge.  Tobacco abuse Currently on nicotine patch 21 mg daily. Smoking cessation discussed.  Leukocytosis likely due to steroids. No s/s of infection  Mild hyperglycemia. ? Due to steroids. Check ha1c at rehab to r/o underlying DM   Procedures:  Echo  (i.e. Studies not automatically included, echos, thoracentesis, etc; not x-rays)  Consultations:  Neurology   Discharge Exam: Vitals:   03/04/19 0351 03/04/19 0805  BP: (!) 182/98 (!) 167/86  Pulse: 82 87  Resp: 18 19  Temp: 97.6 F (36.4 C) 97.6 F (36.4 C)  SpO2: 98% 98%    General: no distress  Cardiovascular: s1,s2 rrr Respiratory: CTA BL  Discharge Instructions  Discharge Instructions    Ambulatory referral to Neurology   Complete by: As directed    Follow up with stroke clinic NP (Jessica Vanschaick or Cecille Rubin, if both not available, consider Zachery Dauer, or Ahern) at [facility] in about 4 weeks. Thanks.   Diet - low sodium heart healthy   Complete by: As directed    Increase activity slowly   Complete by: As directed      Allergies as of 03/04/2019      Reactions   Lisinopril Swelling   Angioedema      Medication List    STOP taking these medications   methocarbamol 500 MG  tablet Commonly known as: ROBAXIN   naproxen 500 MG tablet Commonly known as: NAPROSYN   traMADol 50 MG tablet Commonly known as: ULTRAM     TAKE these medications   acetaminophen 325 MG tablet Commonly known as: TYLENOL Take 2 tablets (650 mg total) by mouth every 4  (four) hours as needed for mild pain, moderate pain, fever or headache (or temp > 37.5 C (99.5 F)).   amLODipine 10 MG tablet Commonly known as: NORVASC Take 1 tablet (10 mg total) by mouth daily. Start taking on: March 05, 2019   aspirin 81 MG EC tablet Take 1 tablet (81 mg total) by mouth daily. Start taking on: March 05, 2019   atorvastatin 80 MG tablet Commonly known as: LIPITOR Take 1 tablet (80 mg total) by mouth daily at 6 PM.   carvedilol 6.25 MG tablet Commonly known as: COREG Take 1 tablet (6.25 mg total) by mouth 2 (two) times daily with a meal.   clopidogrel 75 MG tablet Commonly known as: PLAVIX Take 1 tablet (75 mg total) by mouth daily for 20 days. Start taking on: March 05, 2019   diphenhydrAMINE 25 MG tablet Commonly known as: BENADRYL Take 1 tablet (25 mg total) by mouth daily for 3 days.   famotidine 20 MG tablet Commonly known as: PEPCID Take 1 tablet (20 mg total) by mouth daily for 5 days.   predniSONE 10 MG tablet Commonly known as: DELTASONE Take 4 tablets (40 mg total) by mouth daily for 2 days.      Allergies  Allergen Reactions  . Lisinopril Swelling    Angioedema   Follow-up Information    [facility]. Schedule an appointment as soon as possible for a visit in 4 week(s).   Specialty: Neurology Contact information: [address] [place] [phone]           The results of significant diagnostics from this hospitalization (including imaging, microbiology, ancillary and laboratory) are listed below for reference.    Significant Diagnostic Studies: Dg Chest 2 View  Result Date: 03/01/2019 CLINICAL DATA:  Right leg weakness. Shortness of breath. EXAM: CHEST - 2 VIEW COMPARISON:  07/24/2015 FINDINGS: The cardiomediastinal contours are normal. The lungs are clear. Pulmonary vasculature is normal. No consolidation, pleural effusion, or pneumothorax. No acute osseous abnormalities  are seen. IMPRESSION: No acute chest findings. Electronically Signed   By: Keith Rake M.D.   On: 03/01/2019 00:17   Dg Tibia/fibula Right  Result Date: 02/28/2019 CLINICAL DATA:  Right leg pain and swelling, onset yesterday. EXAM: RIGHT TIBIA AND FIBULA - 2 VIEW COMPARISON:  Concurrent knee radiographs. FINDINGS: Cortical margins of the tibia and fibular intact. Proximal aspect of the lower extremity included on concurrent knee exam. There is no evidence of fracture or other focal bone lesions. Soft tissues are unremarkable. IMPRESSION: Negative radiographs of the right lower extremity. Electronically Signed   By: Keith Rake M.D.   On: 02/28/2019 21:02   [name] Contrast  Result Date: 02/28/2019 CLINICAL DATA:  Initial evaluation for acute right leg pain. EXAM: MRI HEAD WITHOUT CONTRAST TECHNIQUE: Multiplanar, multiecho pulse sequences of the brain and surrounding structures were obtained without intravenous contrast. COMPARISON:  None available. FINDINGS: Brain: Examination technically limited as the patient was unable to tolerate the full length of the exam. Axial and coronal DWI sequence, with axial T2 and FLAIR sequences, and sagittal T1 weighted sequence only were performed. Additionally, images provided are degraded by motion  artifact. Cerebral volume within normal limits for age. No significant cerebral white matter changes identified on this limited exam. 13 mm focus of restricted diffusion seen involving the posterior limb of the left internal capsule, compatible with acute ischemic infarct (series 4, image 25). No associated mass effect or definite hemorrhage on this limited exam. There is an additional possible punctate focus of diffusion abnormality involving the posterior limb of the contralateral right internal capsule (series 4, image 25), which could reflect an additional tiny acute to subacute ischemic infarct. No other evidence for acute or subacute ischemia. Gray-white  matter differentiation otherwise maintained. No encephalomalacia seen elsewhere to suggest chronic cortical infarction. No mass lesion, midline shift or mass effect. Ventricles normal size without hydrocephalus. No extra-axial fluid collection. Vascular: Major intracranial vascular flow voids maintained. Skull and upper cervical spine: Craniocervical junction within normal limits. Scalp soft tissues and calvarium within normal limits. Sinuses/Orbits: Globes and orbital soft tissues demonstrate no acute finding. Mild scattered mucosal thickening throughout the ethmoidal air cells and maxillary sinuses. Trace layering fluid noted within the right maxillary sinus. Paranasal sinuses are otherwise clear. No mastoid effusion. Inner ear structures grossly normal. Other: None. IMPRESSION: 1. Technically limited exam due to the patient's inability to tolerate the full length of the study as well as motion artifact. 2. 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 3. Additional possible punctate acute to early subacute ischemic infarct involving the posterior limb of the contralateral right internal capsule. Electronically Signed   By: Jeannine Boga M.D.   On: 02/28/2019 22:23   [name] Contrast  Result Date: 02/28/2019 CLINICAL DATA:  Initial evaluation for acute right lower extremity pain, no injury. EXAM: MRI LUMBAR SPINE WITHOUT CONTRAST TECHNIQUE: Multiplanar, multisequence MR imaging of the lumbar spine was performed. No intravenous contrast was administered. COMPARISON:  None available. FINDINGS: Segmentation: Standard. Lowest well-formed disc labeled the L5-S1 level. Alignment: Vertebral bodies normally aligned with preservation of the normal lumbar lordosis. No listhesis. Vertebrae: Vertebral body height well maintained without evidence for acute or chronic fracture. Bone marrow signal intensity within normal limits. Mild reactive endplate changes  present about the L5-S1 interspace. Reactive marrow edema present about the L4-5 facets bilaterally due to facet arthritis, right greater than left. No other abnormal marrow edema. No discrete or worrisome osseous lesions. Conus medullaris and cauda equina: Conus extends to the L2 level. Conus and cauda equina appear normal. Paraspinal and other soft tissues: Paraspinous soft tissues within normal limits. Visualized visceral structures unremarkable. 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 L1-2: Negative interspace. Mild bilateral facet hypertrophy. No canal or foraminal stenosis. L2-3: Negative interspace. Mild bilateral facet hypertrophy, slightly worse on the right. No significant canal or foraminal stenosis. No impingement. L3-4: Negative interspace. Moderate right with mild left facet hypertrophy. No significant spinal stenosis. Foramina remain patent. No impingement. L4-5: Mild annular disc bulge with disc desiccation. Moderate facet and ligament flavum hypertrophy. Associated reactive marrow edema due to facet arthritis, right greater than left. No significant spinal stenosis. Mild bilateral L4 foraminal narrowing without impingement. L5-S1: Mild diffuse disc bulge with disc desiccation. Disc bulging slightly asymmetric to the left. Associated reactive endplate changes with marginal endplate osteophytic spurring. Mild to moderate facet hypertrophy, slightly worse on the left. Central canal remains patent. Moderate bilateral L5 foraminal stenosis, left slightly worse than right. IMPRESSION: 1. Mild degenerative disc bulging with facet hypertrophy at L5-S1 with resultant moderate bilateral L5 foraminal  stenosis. Either of the exiting L5 nerve roots could be affected. 2. Disc bulging with moderate facet hypertrophy at L4-5 with resultant mild bilateral L4 foraminal stenosis. No frank impingement. 3. Mild-to-moderate multilevel facet hypertrophy throughout the lumbar spine, most  pronounced at L4-5 where there is associated reactive marrow edema. Finding could contribute to underlying low back pain. Electronically Signed   By: Jeannine Boga M.D.   On: 02/28/2019 22:11   Dg Knee Complete 4 Views Right  Result Date: 02/28/2019 CLINICAL DATA:  Right leg pain and swelling, onset yesterday. EXAM: RIGHT KNEE - COMPLETE 4+ VIEW COMPARISON:  None. FINDINGS: Tricompartmental osteoarthritis with peripheral spurring. Mild medial tibiofemoral joint space narrowing. No significant joint effusion. No fracture, dislocation, or bony destructive change. No focal soft tissue abnormality. 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 Electronically Signed   By: Keith Rake M.D.   On: 02/28/2019 21:01   Vas US Carotid (at [place] Only)  Result Date: 03/04/2019 Carotid Arterial Duplex Study Indications:       CVA. Risk Factors:      Hypertension, current smoker. Limitations:       Patient anatomy, patient respiratory disturbance Comparison Study:  No prior studies. Performing Technologist: Oliver Hum RVT  Examination Guidelines: A complete evaluation includes B-mode imaging, spectral Doppler, color Doppler, and power Doppler as needed of all accessible portions of each vessel. Bilateral testing is considered an integral part of a complete examination. Limited examinations for reoccurring indications may be performed as noted.  Right Carotid Findings: +----------+--------+--------+--------+-----------------------+--------+           PSV cm/sEDV cm/sStenosisDescribe               Comments +----------+--------+--------+--------+-----------------------+--------+ CCA Prox  81      17              smooth and heterogenous         +----------+--------+--------+--------+-----------------------+--------+ CCA Distal76      23              smooth and heterogenous         +----------+--------+--------+--------+-----------------------+--------+  ICA Prox  69      13              smooth and heterogenous         +----------+--------+--------+--------+-----------------------+--------+ ICA Distal83      26                                     tortuous +----------+--------+--------+--------+-----------------------+--------+ ECA       93      9                                               +----------+--------+--------+--------+-----------------------+--------+ +----------+--------+-------+--------+-------------------+           PSV cm/sEDV cmsDescribeArm Pressure (mmHG) +----------+--------+-------+--------+-------------------+ [id number]                                        +----------+--------+-------+--------+-------------------+ +---------+--------+--+--------+--+---------+ VertebralPSV cm/s65EDV cm/s18Antegrade +---------+--------+--+--------+--+---------+  Left Carotid Findings: +----------+--------+--------+--------+-----------------------+--------+           PSV cm/sEDV cm/sStenosisDescribe               Comments +----------+--------+--------+--------+-----------------------+--------+  CCA Prox  98      20              smooth and heterogenous         +----------+--------+--------+--------+-----------------------+--------+ CCA Distal83      19              smooth and heterogenous         +----------+--------+--------+--------+-----------------------+--------+ ICA Prox  69      26              smooth and heterogenous         +----------+--------+--------+--------+-----------------------+--------+ ICA Distal82      32                                     tortuous +----------+--------+--------+--------+-----------------------+--------+ ECA       89      17                                              +----------+--------+--------+--------+-----------------------+--------+ +----------+--------+--------+--------+-------------------+ SubclavianPSV cm/sEDV cm/sDescribeArm  Pressure (mmHG) +----------+--------+--------+--------+-------------------+           218                                         +----------+--------+--------+--------+-------------------+ +---------+--------+--+--------+--+---------+ VertebralPSV cm/s64EDV cm/s20Antegrade +---------+--------+--+--------+--+---------+  Summary: Right Carotid: Velocities in the right ICA are consistent with a 1-39% stenosis. Left Carotid: Velocities in the left ICA are consistent with a 1-39% stenosis. Vertebrals: Bilateral vertebral arteries demonstrate antegrade flow. *See table(s) above for measurements and observations.  Electronically signed by Antony Contras MD on 03/04/2019 at 8:26:49 AM.    Final    Vas [place] Transcranial Doppler  Result Date: 03/04/2019  Transcranial Doppler Indications: Stroke. Limitations for diagnostic windows: Unable to insonate occipital window. Performing Technologist: June Leap RDMS, RVT  Examination Guidelines: A complete evaluation includes B-mode imaging, spectral Doppler, color Doppler, and power Doppler as needed of all accessible portions of each vessel. Bilateral testing is considered an integral part of a complete examination. Limited examinations for reoccurring indications may be performed as noted.  +----------+-------------+----------+-----------+-------+ RIGHT TCD Right VM (cm)Depth (cm)PulsatilityComment +----------+-------------+----------+-----------+-------+ MCA           56.00                 1.21            +----------+-------------+----------+-----------+-------+ ACA          -40.00                 1.42            +----------+-------------+----------+-----------+-------+ Term ICA      42.00                  1.1            +----------+-------------+----------+-----------+-------+ PCA           20.00                 1.16            +----------+-------------+----------+-----------+-------+ Opthalmic     22.00  1.78             +----------+-------------+----------+-----------+-------+ ICA siphon    26.00                 0.98            +----------+-------------+----------+-----------+-------+  +----------+------------+----------+-----------+-------+ LEFT TCD  Left VM (cm)Depth (cm)PulsatilityComment +----------+------------+----------+-----------+-------+ MCA          59.00                 0.99            +----------+------------+----------+-----------+-------+ ACA          -37.00                1.23            +----------+------------+----------+-----------+-------+ Term ICA     37.00                 1.25            +----------+------------+----------+-----------+-------+ PCA          19.00                 1.35            +----------+------------+----------+-----------+-------+ Opthalmic    15.00                 1.79            +----------+------------+----------+-----------+-------+ ICA siphon   23.00                 1.49            +----------+------------+----------+-----------+-------+  Summary:  Absent occipital window limits evaluation of posterior circulation vessels. Normal mean flow velocities in anterior circulation vessels on either side *See table(s) above for measurements and observations.  Diagnosing physician: Antony Contras MD Electronically signed by Antony Contras MD on 03/04/2019 at 8:30:24 AM.    Final     Microbiology: Recent Results (from the past 240 hour(s))  SARS Coronavirus 2 (CEPHEID - Performed in [place] hospital lab), Hosp Order     Status: None   Collection Time: 02/28/19 10:50 PM   Specimen: Nasopharyngeal Swab  Result Value Ref Range Status   SARS Coronavirus 2 NEGATIVE NEGATIVE Final    Comment: (NOTE) If result is NEGATIVE SARS-CoV-2 target nucleic acids are NOT DETECTED. The SARS-CoV-2 RNA is generally detectable in upper and lower  respiratory specimens during the acute phase of infection. The lowest  concentration of SARS-CoV-2 viral  copies this assay can detect is 250  copies / mL. A negative result does not preclude SARS-CoV-2 infection  and should not be used as the sole basis for treatment or other  patient management decisions.  A negative result may occur with  improper specimen collection / handling, submission of specimen other  than nasopharyngeal swab, presence of viral mutation(s) within the  areas targeted by this assay, and inadequate number of viral copies  (<250 copies / mL). A negative result must be combined with clinical  observations, patient history, and epidemiological information. If result is POSITIVE SARS-CoV-2 target nucleic acids are DETECTED. The SARS-CoV-2 RNA is generally detectable in upper and lower  respiratory specimens dur ing the acute phase of infection.  Positive  results are indicative of active infection with SARS-CoV-2.  Clinical  correlation with patient history and other diagnostic information is  necessary to determine patient infection status.  Positive results do  not rule out bacterial infection  or co-infection with other viruses. If result is PRESUMPTIVE POSTIVE SARS-CoV-2 nucleic acids MAY BE PRESENT.   A presumptive positive result was obtained on the submitted specimen  and confirmed on repeat testing.  While 2019 novel coronavirus  (SARS-CoV-2) nucleic acids may be present in the submitted sample  additional confirmatory testing may be necessary for epidemiological  and / or clinical management purposes  to differentiate between  SARS-CoV-2 and other [place]becovirus currently known to infect humans.  If clinically indicated additional testing with an alternate test  methodology [phone]) is advised. The SARS-CoV-2 RNA is generally  detectable in upper and lower respiratory sp ecimens during the acute  phase of infection. The expected result is Negative. Fact Sheet for Patients:  StrictlyIdeas.no Fact Sheet for Healthcare  Providers: BankingDealers.co.za This test is not yet approved or cleared by the [place] FDA and has been authorized for detection and/or diagnosis of SARS-CoV-2 by FDA under an Emergency Use Authorization (EUA).  This EUA will remain in effect (meaning this test can be used) for the duration of the COVID-19 declaration under Section 564(b)(1) of the Act, 21 U.S.C. section 360bbb-3(b)(1), unless the authorization is terminated or revoked sooner. Performed at [facility], [place] [address] [place], [place] [address]      Labs: Basic Metabolic Panel: Recent Labs  Lab 02/28/19 2223 03/02/19 0412 03/04/19 0549  NA 138 135 137  K 3.5 3.9 4.0  CL 104 101 103  CO2 24 24 23   GLUCOSE 112* 154* 241*  BUN 9 9 17   CREATININE 0.91 1.04 1.26*  CALCIUM 9.5 9.6 9.6   Liver Function Tests: Recent Labs  Lab 03/04/19 0549  AST 19  ALT 24  ALKPHOS 89  BILITOT 0.6  PROT 7.1  ALBUMIN 4.0   No results for input(s): LIPASE, AMYLASE in the last 168 hours. No results for input(s): AMMONIA in the last 168 hours. CBC: Recent Labs  Lab 02/28/19 2223 03/02/19 0412 03/04/19 0549  WBC 13.0* 10.7* 17.8*  NEUTROABS 7.1  --  15.0*  HGB 15.9 16.2 15.8  HCT 47.8 47.9 47.1  MCV 94.3 91.6 91.3  PLT 188 183 222   Cardiac Enzymes: No results for input(s): CKTOTAL, CKMB, CKMBINDEX, TROPONINI in the last 168 hours. BNP: BNP (last 3 results) No results for input(s): BNP in the last 8760 hours.  ProBNP (last 3 results) No results for input(s): PROBNP in the last 8760 hours.  CBG: Recent Labs  Lab 03/01/19 2129  GLUCAP 145*       Signed:  Rowe Clack N  [facility] 03/04/2019, 10:45 AM

## 2019-03-04 NOTE — Progress Notes (Signed)
Physical Therapy Treatment Patient Details Name: Casey Petty MRN: [id number] DOB: February 21, 1961 Today's Date: 03/04/2019    History of Present Illness Casey Petty  is a 58 y.o. male,  w hypertension, presents with c/o right leg pain/weakness. MRI:13 mm acute ischemic infarct involving the posterior limb of theleft internal capsule.andpossible punctate acute to early subacute ischemicinfarct involving the posterior limb of the contralateral rightinternal capsule.    PT Comments    Patient seen for mobility progression. Patient requiring Min A for bed level mobility and sit to stand at bedside and recliner, Mod A for pivot transfer due to R LE instability. Requires R knee blocking throughout session. Good tolerance to standing pre-gait activities with improved weight shifting to R LE. Will continue to recommend CIR at discharge.     Follow Up Recommendations  CIR     Equipment Recommendations  Other (comment)(TBD)    Recommendations for Other Services Rehab consult     Precautions / Restrictions Precautions Precautions: Fall Restrictions Weight Bearing Restrictions: No    Mobility  Bed Mobility Overal bed mobility: Needs Assistance Bed Mobility: Supine to Sit     Supine to sit: Min guard;Min assist     General bed mobility comments: patient with heavy use of bed rails and L UE/LE to scoot to EOB; light Min A for steadying  Transfers Overall transfer level: Needs assistance Equipment used: 1 person hand held assist Transfers: Sit to/from [facility] Sit to Stand: Min assist Stand pivot transfers: Mod assist       General transfer comment: Min A to stand from bedside and recliner x 5 reps; Mod A for pivot transfers due to R LE instability with knee buckle  Ambulation/Gait             General Gait Details: pre-gait activities: sit to stnad x 5 reps working on equal weight shift; standing weight shifting with R knee blocking - improved toelrance  to weight bearing with 1 HHA    Stairs             Wheelchair Mobility    Modified Rankin (Stroke Patients Only) Modified Rankin (Stroke Patients Only) Pre-Morbid Rankin Score: No symptoms Modified Rankin: Moderately severe disability     Balance Overall balance assessment: Needs assistance Sitting-balance support: Single extremity supported;Feet supported Sitting balance-Leahy Scale: Fair     Standing balance support: Single extremity supported;During functional activity Standing balance-Leahy Scale: Poor                              Cognition Arousal/Alertness: Awake/alert Behavior During Therapy: WFL for tasks assessed/performed Overall Cognitive Status: No family/caregiver present to determine baseline cognitive functioning                                        Exercises      General Comments General comments (skin integrity, edema, etc.): angioedema to R side face due to allergic reaction      Pertinent Vitals/Pain Pain Assessment: No/denies pain    Home Living             Home Layout: (one level but on second floor)        Prior Function            PT Goals (current goals can now be found in the care plan section) Acute Rehab PT Goals  Patient Stated Goal: "I'm only 5, I need to go to rehab" PT Goal Formulation: With patient Time For Goal Achievement: 03/15/19 Potential to Achieve Goals: Good Progress towards PT goals: Progressing toward goals    Frequency    Min 4X/week      PT Plan Current plan remains appropriate    Co-evaluation              AM-PAC PT "6 Clicks" Mobility   Outcome Measure  Help needed turning from your back to your side while in a flat bed without using bedrails?: A Little Help needed moving from lying on your back to sitting on the side of a flat bed without using bedrails?: A Little Help needed moving to and from a bed to a chair (including a wheelchair)?: A Lot Help  needed standing up from a chair using your arms (e.g., wheelchair or bedside chair)?: A Little Help needed to walk in hospital room?: A Lot Help needed climbing 3-5 steps with a railing? : Total 6 Click Score: 14    End of Session Equipment Utilized During Treatment: Gait belt Activity Tolerance: Patient tolerated treatment well Patient left: in chair;with call bell/phone within reach;with chair alarm set Nurse Communication: Mobility status PT Visit Diagnosis: Unsteadiness on feet (R26.81);Other abnormalities of gait and mobility (R26.89);Muscle weakness (generalized) (M62.81)     Time: 0370-4888 PT Time Calculation (min) (ACUTE ONLY): 16 min  Charges:  $Therapeutic Activity: 8-22 mins                     Lanney Gins, PT, DPT Supplemental Physical Therapist 03/04/19 12:22 PM Pager: [phone] Office: [phone]

## 2019-03-04 NOTE — Progress Notes (Signed)
Meredith Staggers, MD  Physician  Physical Medicine and Rehabilitation  PMR Pre-admission  Signed  Date of Service:  03/04/2019 11:05 AM      Related encounter: ED to Hosp-Admission (Discharged) from 02/28/2019 in [facility]      Signed         Show:Clear all _0 Manual_1 Template_2 Copied  Added by: _3 Cristina Gong, RN_4 Meredith Staggers, MD  _5 Hover for details PMR Admission Coordinator Pre-Admission Assessment  Patient: Casey Petty is an 58 y.o., male MRN: [id number] DOB: 23-Oct-1960 Height: _6  (170.2 cm) Weight: 74.3 kg  Insurance Information  PRIMARY: uninsured      Spoke with financial counselor, Jenny Reichmann, at [phone] and she is arranging disability and medicaid applications  Medicaid Application Date:       Case Manager:  Disability Application Date:       Case Worker:   The "Data Collection Information Summary" for patients in Inpatient Rehabilitation Facilities with attached "Privacy Act [place] Records" was provided and verbally reviewed with: N/A  Emergency Contact Information         Contact Information    Name Relation Home Work [place] Significant other   [place] Sister   [phone]      Current Medical History  Patient Admitting Diagnosis: left Lacunar CVA  History of Present Illness: 58 year old male presented 03/01/2019 with history of HTN with complaints of right sided weakness with trouble walking. .Did not receive TPA for late presentation.  MRI L spine as well as brain which demonstrated left lacunar subcortical stroke. Resultant dysarthria and right hemiparesis. Patient was off HTN meds for over 3 years.  TCD unremarkable, carotid doppler unremarkable, 2D echo > 65%, UDS negative. VTE prophylaxis with lovenox. On no antithrombotic pta, now on ASA 81 mg daily and Clopidogrel 75 mg daily. Continue DAPT for 3 weeks and then ASA alone.  On  Amlodipine. Lisinopril added  And then on 7/26 noted angioedema of right face and lip therefore lisinopril discontinued. Swelling improved on 7/27 day of admission to CIR.Marland Kitchen Started on coreg, and continue on amlodipine and plans to titrate on rehab as needed. Prednisone for swelling and plans to continue for 2 days.Mild leukocytosis felt likely due to steroids. No signs of infection.  Lipitor 80 mg daily and continue statin at discharge.  Will add nicotine patch 21 mg daily. Smoking cessation discussed.   Complete NIHSS TOTAL: 8  Patient's medical record from [facility]  has been reviewed by the rehabilitation admission coordinator and physician.  Past Medical History      Past Medical History:  Diagnosis Date  . Hypertension     Family History   family history includes Heart attack in his father.  Prior Rehab/Hospitalizations Has the patient had prior rehab or hospitalizations prior to admission? Yes  Has the patient had major surgery during 100 days prior to admission? No              Current Medications  Current Facility-Administered Medications:  .  acetaminophen (TYLENOL) tablet 650 mg, 650 mg, Oral, Q4H PRN, 650 mg at 03/03/19 0842 **OR** acetaminophen (TYLENOL) solution 650 mg, 650 mg, Per Tube, Q4H PRN **OR** acetaminophen (TYLENOL) suppository 650 mg, 650 mg, Rectal, Q4H PRN, Jani Gravel, MD .  amLODipine (NORVASC) tablet 10 mg, 10 mg, Oral, Daily, Jani Gravel, MD, 10 mg at 03/04/19 0841 .  aspirin EC tablet 81 mg, 81 mg, Oral, Daily, Rosalin Hawking, MD, [phone]  mg at 03/04/19 0841 .  atorvastatin (LIPITOR) tablet 80 mg, 80 mg, Oral, q1800, Jani Gravel, MD, 80 mg at 03/03/19 2029 .  carvedilol (COREG) tablet 6.25 mg, 6.25 mg, Oral, BID WC, Buriev, Arie Sabina, MD, 6.25 mg at 03/04/19 0841 .  clopidogrel (PLAVIX) tablet 75 mg, 75 mg, Oral, Daily, Greta Doom, MD, 75 mg at 03/04/19 0840 .  diphenhydrAMINE (BENADRYL) injection 25 mg, 25 mg, Intravenous, Q6H,  Lavina Hamman, MD, 25 mg at 03/04/19 0124 .  famotidine (PEPCID) tablet 20 mg, 20 mg, Oral, BID, Buriev, Ulugbek N, MD .  methylPREDNISolone sodium succinate (SOLU-MEDROL) 125 mg/2 mL injection 60 mg, 60 mg, Intravenous, Q6H, Lavina Hamman, MD, 60 mg at 03/04/19 0124 .  nicotine (NICODERM CQ - dosed in mg/24 hours) patch 21 mg, 21 mg, Transdermal, Daily, Buriev, Arie Sabina, MD  Patients Current Diet:     Diet Order                  Diet - low sodium heart healthy         Diet full liquid Room service appropriate? Yes; Fluid consistency: Thin  Diet effective now               Precautions / Restrictions Precautions Precautions: Fall Restrictions Weight Bearing Restrictions: No   Has the patient had 2 or more falls or a fall with injury in the past year? 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 Independent  Indoor Mobility: Did the patient need assistance with walking from room to room (with or without device)? Independent  Stairs: Did the patient need assistance with internal or external stairs (with or without device)? Independent  Functional Cognition: Did the patient need help planning regular tasks such as shopping or remembering to take medications? Independent  Home Assistive Devices / Equipment Home Assistive Devices/Equipment: None Home Equipment: Grab bars - tub/shower  Prior Device Use: Indicate devices/aids used by the patient prior to current illness, exacerbation or injury? None of the above  Current Functional Level Cognition  Arousal/Alertness: Awake/alert Overall Cognitive Status: No family/caregiver present to determine baseline cognitive functioning Orientation Level: Oriented X4 Attention: Focused, Sustained Focused Attention: Appears intact Sustained Attention:  Appears intact Memory: Appears intact(Immediate: 3/3; delayed: 3/3) Awareness: Appears intact Problem Solving: Impaired Problem Solving Impairment: Verbal complex    Extremity Assessment (includes Sensation/Coordination)  Upper Extremity Assessment: Defer to OT evaluation RUE Deficits / Details: increased tone, he can move arm with greatly increased effort and not full ROM RUE Coordination: decreased fine motor, decreased gross motor  Lower Extremity Assessment: RLE deficits/detail RLE Deficits / Details: increased tone, reduced weight shift to R LE - buckle noted; does not lift LE throughout full ROM against gravity  RLE Coordination: decreased fine motor, decreased gross motor    ADLs  Overall ADL's : Needs assistance/impaired Eating/Feeding: Set up, Sitting Grooming: Moderate assistance, Sitting Upper Body Bathing: Moderate assistance, Sitting Lower Body Bathing: Moderate assistance Lower Body Bathing Details (indicate cue type and reason): +2 min A sit<>stand Upper Body Dressing : Moderate assistance, Sitting Lower Body Dressing: Maximal assistance Lower Body Dressing Details (indicate cue type and reason): +2 min A sit<>stand Toilet Transfer: Moderate assistance, +2 for physical assistance, Stand-pivot Toilet Transfer Details (indicate cue type and reason): bed>recliner going to pt's right Toileting- Clothing Manipulation and Hygiene: Maximal assistance Toileting -  Clothing Manipulation Details (indicate cue type and reason): +2 min A sit<>stand    Mobility  Overal bed mobility: Needs Assistance Bed Mobility: Supine to Sit Supine to sit: Min guard General bed mobility comments: pt able to power himself up to EOB using tone on right  in his favor    Transfers  Overall transfer level: Needs assistance Equipment used: 2 person hand held assist Transfers: Sit to/from Stand, Stand Pivot Transfers Sit to Stand: Min assist, +2 physical assistance Stand pivot transfers:  Mod assist, +2 physical assistance General transfer comment: Pt needed A to block his right knee when he stepped with LLE otherwise RLE buckled    Ambulation / Gait / Stairs / Wheelchair Mobility  Ambulation/Gait General Gait Details: deferred    Posture / Balance Balance Overall balance assessment: Needs assistance Sitting-balance support: No upper extremity supported Sitting balance-Leahy Scale: Fair Standing balance support: Bilateral upper extremity supported, During functional activity Standing balance-Leahy Scale: Poor    Special needs/care consideration BiPAP/CPAP  N/a CPM  N/a Continuous Drip IV  N/a Dialysis n/a Life Vest  N/a Oxygen  N/a Special Bed  N/a Trach Size  N/a Wound Vac n/a Skin intact Bowel mgmt:  Continent LBM 7/26 Bladder mgmt:  External catheter Diabetic mgmt: Hgb A1c not tested Behavioral consideration  N/a Chemo/radiation  N/a   Previous Home Environment  Living Arrangements: Spouse/significant other  Lives With: Significant other Available Help at Discharge: Family, Available 24 hours/day Type of Home: Apartment Home Layout: (one level but on second floor) Home Access: Stairs to enter Entrance Stairs-Rails: Right Entrance Stairs-Number of Steps: 10 Bathroom Shower/Tub: Tub/shower unit, Architectural technologist: Standard Bathroom Accessibility: Yes How Accessible: Accessible via walker [place]: No  Discharge Living Setting Plans for Discharge Living Setting: Patient's home, Apartment(girlfriend, Katharine Look) Type of Home at Discharge: Apartment Discharge Home Layout: One level(but on second floord) Discharge Home Access: Stairs to enter Entrance Stairs-Rails: Right Entrance Stairs-Number of Steps: 10 Discharge Bathroom Shower/Tub: Tub/shower unit, Curtain Discharge Bathroom Toilet: Standard Discharge Bathroom Accessibility: Yes How Accessible: Accessible via walker Does the patient have any problems obtaining your  medications?: Yes (Describe)(no insurance)  Social/Family/Support Systems Patient Roles: Parent, Partner(has four adult children) Contact Information: Katharine Look, girlfriend Anticipated Caregiver: Katharine Look Anticipated Ambulance person Information: see above Ability/Limitations of Caregiver: no limitations Caregiver Availability: 24/7 Discharge Plan Discussed with Primary Caregiver: Yes Is Caregiver In Agreement with Plan?: Yes Does Caregiver/Family have Issues with Lodging/Transportation while Pt is in Rehab?: No  Goals/Additional Needs Patient/Family Goal for Rehab: Mod I to supervision with PT, OT, and SLP Expected length of stay: ELOS 7 to 10 days Pt/Family Agrees to Admission and willing to participate: Yes Program Orientation Provided & Reviewed with Pt/Caregiver Including Roles  & Responsibilities: Yes  Decrease burden of Care through IP rehab admission: n/a  Possible need for SNF placement upon discharge:  Not anticipated  Patient Condition: I have reviewed medical records from [facility]  spoken with CSW, and patient and spouse. I met with patient at the bedside for inpatient rehabilitation assessment.  Patient will benefit from ongoing PT, OT and SLP, can actively participate in 3 hours of therapy a day 5 days of the week, and can make measurable gains during the admission.  Patient will also benefit from the coordinated team approach during an Inpatient Acute Rehabilitation admission.  The patient will receive intensive therapy as well as Rehabilitation physician, nursing, social worker, and care management interventions.  Due to bladder management, bowel management,  safety, skin/wound care, disease management, medication administration, pain management and patient education the patient requires 24 hour a day rehabilitation nursing.  The patient is currently mod assist with mobility and basic ADLs.  Discharge setting and therapy post discharge at home with home health is  anticipated.  Patient has agreed to participate in the Acute Inpatient Rehabilitation Program and will admit today.  Preadmission Screen Completed By:  Cleatrice Burke, 03/04/2019 11:22 AM ______________________________________________________________________   Discussed status with Dr. Naaman Plummer  on  03/04/2019 at 1125 and received approval for admission today.  Admission Coordinator:  Cleatrice Burke, RN, time  4734 Date  03/04/2019   Assessment/Plan: Diagnosis: left lacunar subcortical cva 1. Does the need for close, 24 hr/day Medical supervision in concert with the patient's rehab needs make it unreasonable for this patient to be served in a less intensive setting? Yes 2. Co-Morbidities requiring supervision/potential complications: HTN 3. Due to bladder management, bowel management, safety, skin/wound care, disease management, medication administration, pain management and patient education, does the patient require 24 hr/day rehab nursing? Yes 4. Does the patient require coordinated care of a physician, rehab nurse, PT (1-2 hrs/day, 5 days/week), OT (1-2 hrs/day, 5 days/week) and SLP (1-2 hrs/day, 5 days/week) to address physical and functional deficits in the context of the above medical diagnosis(es)? Yes Addressing deficits in the following areas: balance, endurance, locomotion, strength, transferring, bowel/bladder control, bathing, dressing, feeding, grooming, toileting, cognition, speech and psychosocial support 5. Can the patient actively participate in an intensive therapy program of at least 3 hrs of therapy 5 days a week? Yes 6. The potential for patient to make measurable gains while on inpatient rehab is excellent 7. Anticipated functional outcomes upon discharge from inpatients are: modified independent and supervision PT, modified independent and supervision OT, modified independent and supervision SLP 8. Estimated rehab length of stay to reach the above functional  goals is: 7-10 days 9. Anticipated D/C setting: Home 10. Anticipated post D/C treatments: [place] therapy 11. Overall Rehab/Functional Prognosis: excellent  MD Signature: Meredith Staggers, MD, [facility] 03/04/2019         Revision History

## 2019-03-04 NOTE — Progress Notes (Signed)
Inpatient Rehabilitation Admissions Coordinator  I met with patient at bedside as we conference called his girlfriend, Katharine Look, by phone. We dicussed goals and expectations again of a CIR stay with ELOS of 7 to 10 days. Both are in agreement to admit today. I will contact Dr. Daleen Bo to arrange d/c to CIR today. SW, Kathlee Nations, is aware. I have pt's cell phone to Charge for him per his request. I will return it once charged today.  Danne Baxter, RN, MSN Rehab Admissions Coordinator [phone] 03/04/2019 10:31 AM

## 2019-03-04 NOTE — Progress Notes (Addendum)
Patient admitted to [phone]. Patient oriented to Rehab. Medications, Rehab schedule and Plan of care reviewed with understanding verbalized.  Casey Petty

## 2019-03-04 NOTE — Progress Notes (Signed)
Inpatient Rehabilitation Admissions Coordinator  I returned pt's cell phone to him.  Danne Baxter, RN, MSN Rehab Admissions Coordinator [phone] 03/04/2019 11:34 AM

## 2019-03-04 NOTE — Progress Notes (Signed)
[facility] PROGRESS NOTE  Casey Petty SVX:[id number] DOB: 12/10/1960 DOA: 02/28/2019 PCP: Patient, [name]  Brief summary   58 y.o.male,w hypertension, presents with c/o right leg pain/weakness starting 5pm Wednesday. Pt denies numbness, tingling. Pt denies headache, vision change, slurred speech, cp, palp, sob, n/v, abd pain, diarrhea, brbpr, dysuria, hematuria. Pt is not currently on bp medication. He lives with his wife.  Patient was admitted with right-sided weakness secondary to acute lacunar subcortical CVA on the left side. He has been started on dual antiplatelet therapy with carotid Dopplers, echocardiogram, and PT/OT evaluation. Neurology following.  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 Felt to be likely secondary to poor control hypertension and tobacco abuse.  Patient was off BP medications for over 3 years. MRI shows 13 mm acute ischemic infarct involving the posterior limb of the left internal capsule.  Resultant dysarthria and right hemiparesis.  Echo shows LVEF 65%, A1c, LDL PT recommending CIR.  Continue DAPT for 3 weeks then ASA along. dispo:P rehab   Hypertension urgency. Permissive hypertension was allowed, discussed with neurology and recommended to start normalizing blood pressure slowly.  Patient currently on amlodipine 10 mg. BP in the high and, neurology added lisinopril 20 mg daily.  then developed mild angioedema. No stridor, no change in voice but mild lip, facial swelling. Improved. Due to angioedema will discontinue lisinopril. Started on coreg, titrate as needed  Acute angioedema secondary to lisinopril. Improved. Medication discontinued. No crowding of pharynx or stridor on examination.  Hyperlipidemia LDL goal less than 70.  Patient currently on Lipitor 80 mg daily.  Continue at discharge.  Tobacco abuse Currently on nicotine patch 21 mg daily.  Smoking cessation discussed.  Leukocytosis likely due  to steroids. No s/s of infection   Code Status: full Family Communication: d/w patient, Therapist, sports.SW (indicate person spoken with, relationship, and if by phone, the number) Disposition Plan: rehab    Consultants:  Neurology   Procedures:  Echo   Antibiotics: Anti-infectives (From admission, onward)   None        (indicate start date, and stop date if known)  HPI/Subjective: No distress. mild dysarthria, right hemiopalgia   Objective: Vitals:   03/04/19 0351 03/04/19 0805  BP: (!) 182/98 (!) 167/86  Pulse: 82 87  Resp: 18 19  Temp: 97.6 F (36.4 C) 97.6 F (36.4 C)  SpO2: 98% 98%    Intake/Output Summary (Last 24 hours) at 03/04/2019 1024 Last data filed at 03/03/2019 1957 Gross per 24 hour  Intake 100 ml  Output 675 ml  Net -575 ml   Filed Weights   02/28/19 2018 03/01/19 0321  Weight: 74.8 kg 74.3 kg    Exam:   General:  No distress   Cardiovascular: s1,s2 rrr  Respiratory: CTA BL  Abdomen: soft, nt   Musculoskeletal: no leg edema    Data Reviewed: Basic Metabolic Panel: Recent Labs  Lab 02/28/19 2223 03/02/19 0412 03/04/19 0549  NA 138 135 137  K 3.5 3.9 4.0  CL 104 101 103  CO2 24 24 23   GLUCOSE 112* 154* 241*  BUN 9 9 17   CREATININE 0.91 1.04 1.26*  CALCIUM 9.5 9.6 9.6   Liver Function Tests: Recent Labs  Lab 03/04/19 0549  AST 19  ALT 24  ALKPHOS 89  BILITOT 0.6  PROT 7.1  ALBUMIN 4.0   No results for input(s): LIPASE, AMYLASE in the last 168 hours. No results for input(s): AMMONIA in the last 168 hours. CBC: Recent Labs  Lab 02/28/19 2223 03/02/19 0412 03/04/19 0549  WBC 13.0* 10.7* 17.8*  NEUTROABS 7.1  --  15.0*  HGB 15.9 16.2 15.8  HCT 47.8 47.9 47.1  MCV 94.3 91.6 91.3  PLT 188 183 222   Cardiac Enzymes: No results for input(s): CKTOTAL, CKMB, CKMBINDEX, TROPONINI in the last 168 hours. BNP (last 3 results) No results for input(s): BNP in the last 8760 hours.  ProBNP (last 3 results) No results for  input(s): PROBNP in the last 8760 hours.  CBG: Recent Labs  Lab 03/01/19 2129  GLUCAP 145*    Recent Results (from the past 240 hour(s))  SARS Coronavirus 2 (CEPHEID - Performed in [place] hospital lab), Hosp Order     Status: None   Collection Time: 02/28/19 10:50 PM   Specimen: Nasopharyngeal Swab  Result Value Ref Range Status   SARS Coronavirus 2 NEGATIVE NEGATIVE Final    Comment: (NOTE) If result is NEGATIVE SARS-CoV-2 target nucleic acids are NOT DETECTED. The SARS-CoV-2 RNA is generally detectable in upper and lower  respiratory specimens during the acute phase of infection. The lowest  concentration of SARS-CoV-2 viral copies this assay can detect is 250  copies / mL. A negative result does not preclude SARS-CoV-2 infection  and should not be used as the sole basis for treatment or other  patient management decisions.  A negative result may occur with  improper specimen collection / handling, submission of specimen other  than nasopharyngeal swab, presence of viral mutation(s) within the  areas targeted by this assay, and inadequate number of viral copies  (<250 copies / mL). A negative result must be combined with clinical  observations, patient history, and epidemiological information. If result is POSITIVE SARS-CoV-2 target nucleic acids are DETECTED. The SARS-CoV-2 RNA is generally detectable in upper and lower  respiratory specimens dur ing the acute phase of infection.  Positive  results are indicative of active infection with SARS-CoV-2.  Clinical  correlation with patient history and other diagnostic information is  necessary to determine patient infection status.  Positive results do  not rule out bacterial infection or co-infection with other viruses. If result is PRESUMPTIVE POSTIVE SARS-CoV-2 nucleic acids MAY BE PRESENT.   A presumptive positive result was obtained on the submitted specimen  and confirmed on repeat testing.  While 2019 novel  coronavirus  (SARS-CoV-2) nucleic acids may be present in the submitted sample  additional confirmatory testing may be necessary for epidemiological  and / or clinical management purposes  to differentiate between  SARS-CoV-2 and other Sarbecovirus currently known to infect humans.  If clinically indicated additional testing with an alternate test  methodology [phone]) is advised. The SARS-CoV-2 RNA is generally  detectable in upper and lower respiratory sp ecimens during the acute  phase of infection. The expected result is Negative. Fact Sheet for Patients:  StrictlyIdeas.no Fact Sheet for Healthcare Providers: BankingDealers.co.za This test is not yet approved or cleared by the [place] FDA and has been authorized for detection and/or diagnosis of SARS-CoV-2 by FDA under an Emergency Use Authorization (EUA).  This EUA will remain in effect (meaning this test can be used) for the duration of the COVID-19 declaration under Section 564(b)(1) of the Act, 21 U.S.C. section 360bbb-3(b)(1), unless the authorization is terminated or revoked sooner. Performed at [facility], [address] [place], [place] [address]      Studies: No results found.  Scheduled Meds: . amLODipine  10 mg Oral Daily  . aspirin EC  81 mg Oral Daily  . atorvastatin  80 mg Oral q1800  . carvedilol  6.25 mg Oral BID WC  . clopidogrel  75 mg Oral Daily  . diphenhydrAMINE  25 mg Intravenous Q6H  . famotidine  20 mg Oral BID  . methylPREDNISolone (SOLU-MEDROL) injection  60 mg Intravenous Q6H   Continuous Infusions:  Principal Problem:   Stroke [facility]) Active Problems:   Essential hypertension   Tobacco abuse   Hyperglycemia    Time spent: >35 minutes     Kinnie Feil  [facility] Pager [phone]. If 7PM-7AM, please contact night-coverage at [email or web address], password [facility] 03/04/2019, 10:24 AM  LOS: 3 days

## 2019-03-05 ENCOUNTER — Inpatient Hospital Stay (HOSPITAL_COMMUNITY): Payer: Medicaid Other | Admitting: Speech Pathology

## 2019-03-05 ENCOUNTER — Inpatient Hospital Stay (HOSPITAL_COMMUNITY): Payer: Medicaid Other

## 2019-03-05 ENCOUNTER — Inpatient Hospital Stay (HOSPITAL_COMMUNITY): Payer: Medicaid Other | Admitting: Occupational Therapy

## 2019-03-05 DIAGNOSIS — G8191 Hemiplegia, unspecified affecting right dominant side: Secondary | ICD-10-CM

## 2019-03-05 LAB — COMPREHENSIVE METABOLIC PANEL
ALT: 24 U/L (ref 0–44)
AST: 21 U/L (ref 15–41)
Albumin: 4.1 g/dL (ref 3.5–5.0)
Alkaline Phosphatase: 75 U/L (ref 38–126)
Anion gap: 10 (ref 5–15)
BUN: 19 mg/dL (ref 6–20)
CO2: 28 mmol/L (ref 22–32)
Calcium: 9.6 mg/dL (ref 8.9–10.3)
Chloride: 100 mmol/L (ref 98–111)
Creatinine, Ser: 1.18 mg/dL (ref 0.61–1.24)
GFR calc Af Amer: >60 mL/min (ref >60)
GFR calc non Af Amer: >60 mL/min (ref >60)
Glucose, Bld: 151 mg/dL — ABNORMAL HIGH (ref 70–99)
Potassium: 4.3 mmol/L (ref 3.5–5.1)
Sodium: 138 mmol/L (ref 135–145)
Total Bilirubin: 0.7 mg/dL (ref 0.3–1.2)
Total Protein: 7.2 g/dL (ref 6.5–8.1)

## 2019-03-05 LAB — CBC WITH DIFFERENTIAL/PLATELET
Abs Immature Granulocytes: 0.13 10*3/uL — ABNORMAL HIGH (ref 0.00–0.07)
Basophils Absolute: 0 10*3/uL (ref 0.0–0.1)
Basophils Relative: 0 %
Eosinophils Absolute: 0 10*3/uL (ref 0.0–0.5)
Eosinophils Relative: 0 %
HCT: 46.3 % (ref 39.0–52.0)
Hemoglobin: 15.3 g/dL (ref 13.0–17.0)
Immature Granulocytes: 1 %
Lymphocytes Relative: 15 %
Lymphs Abs: 3.1 10*3/uL (ref 0.7–4.0)
MCH: 30.7 pg (ref 26.0–34.0)
MCHC: 33 g/dL (ref 30.0–36.0)
MCV: 93 fL (ref 80.0–100.0)
Monocytes Absolute: 1.6 10*3/uL — ABNORMAL HIGH (ref 0.1–1.0)
Monocytes Relative: 8 %
Neutro Abs: 15.8 10*3/uL — ABNORMAL HIGH (ref 1.7–7.7)
Neutrophils Relative %: 76 %
Platelets: 222 10*3/uL (ref 150–400)
RBC: 4.98 MIL/uL (ref 4.22–5.81)
RDW: 13.2 % (ref 11.5–15.5)
WBC: 20.6 10*3/uL — ABNORMAL HIGH (ref 4.0–10.5)
nRBC: 0 % (ref 0.0–0.2)

## 2019-03-05 NOTE — Evaluation (Signed)
Physical Therapy Assessment and Plan  Patient Details  Name: Casey Petty MRN: [id number] Date of Birth: 1960-09-05  PT Diagnosis: Difficulty walking, Hemiparesis dominant, Impaired cognition, Impaired sensation and Muscle weakness Rehab Potential: Good ELOS: 10-14 days   Today's Date: 03/05/2019 PT Individual Time: 9924-2683 PT Individual Time Calculation (min): 85 min    Problem List:  Patient Active Problem List   Diagnosis Date Noted  . Basal ganglia stroke (Casey Petty) 03/04/2019  . Stroke (Casey Petty) 02/28/2019  . Essential hypertension 02/28/2019  . Tobacco abuse 02/28/2019  . Hyperglycemia 02/28/2019    Past Medical History:  Past Medical History:  Diagnosis Date  . Hypertension    Past Surgical History: History reviewed. No pertinent surgical history.  Assessment & Plan Clinical Impression: Patient is a 58 y.o. year old male with history of HTN--no meds X 3 yearswho was admitted on 03/01/2019 with 2-day history of right-sided weakness with RLE pain and difficulty walkingand slurred speech.Blood pressure elevated at admission andUDS negative. MRI lumbar spine showed moderate bilateral L5 foraminal stenosis and mild to moderate facet arthropathy more pronounced at L4-L5. 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 2 D echo done revealing mild concentric LVH with EF >65% and no shunt. Carotid dopplers were negative for significant ICA stenosis. Dr. [name] felt that stroke was due to small vessel disease and recommended ASA/Plavix for 3 weeks followed by ASA alone. Blood pressures continue to be labile and medications being titrated. He developed angioedema with addition of Lisinopril therefore this was discontinued and he received IV solumedrol with improvement. Patient with anarthria/dysarthria with mild receptive deficits and left sided weakness affecting  mobility and ADLs. CIR recommended due to functional decline.  Patient transferred to CIR on 03/04/2019 .   Patient currently requires mod with mobility secondary to muscle weakness, impaired timing and sequencing, abnormal tone and decreased coordination, decreased attention and decreased awareness and decreased standing balance, decreased postural control, hemiplegia and decreased balance strategies.  Prior to hospitalization, patient was independent  with mobility and lived with Significant other in a [place] home.  Home access is 10Stairs to enter.  Patient will benefit from skilled PT intervention to maximize safe functional mobility, minimize fall risk and decrease caregiver burden for planned discharge home with 24 hour assist.  Anticipate patient will benefit from follow up [facility] at discharge.  PT - End of Session Activity Tolerance: Tolerates 30+ min activity with multiple rests Endurance Deficit: Yes PT Assessment Rehab Potential (ACUTE/IP ONLY): Good PT Barriers to Discharge: Inaccessible home environment PT Barriers to Discharge Comments: 10 STE PT Patient demonstrates impairments in the following area(s): Balance;Behavior;Endurance;Motor;Perception;Safety;Sensory PT Transfers Functional Problem(s): Bed Mobility;Bed to Chair;Car;Furniture;Floor PT Locomotion Functional Problem(s): Ambulation;Wheelchair Mobility;Stairs PT Plan PT Intensity: Minimum of 1-2 x/day ,45 to 90 minutes PT Frequency: 5 out of 7 days PT Duration Estimated Length of Stay: 10-14 days PT Treatment/Interventions: Ambulation/gait training;Balance/vestibular training;Cognitive remediation/compensation;Community reintegration;Discharge planning;Disease management/prevention;Patient/family education;Therapeutic Exercise;Therapeutic Activities;Stair training;Neuromuscular re-education;Wheelchair propulsion/positioning;Visual/perceptual remediation/compensation;Functional mobility training;UE/LE Coordination  activities;Functional electrical stimulation;DME/adaptive equipment instruction;Psychosocial support;UE/LE Strength taining/ROM;Splinting/orthotics PT Transfers Anticipated Outcome(s): supervision PT Locomotion Anticipated Outcome(s): CGA PT Recommendation Follow Up Recommendations: Home health PT Patient destination: Home Equipment Recommended: To be determined  Skilled Therapeutic Intervention Evaluation completed (see details above and below) with education on PT POC and goals and individual treatment initiated with focus on functional mobility, safety, awareness and gait. Pt seated in w/c upon PT arrival, agreeable to therapy tx  and denies pain. Pt worked on w/c propulsion this session to propel w/c to the gym x 150 ft with CGA and cues for L hemi techniques/coordintation. Pt performed stand pivot to the mat with mod assist, during the transfer pt lost balance backwards and to the R laying back on the mat. Pt performed supine<>sitting transfer on the mat with min-mod assist. Pt ambulated x 20 ft this session without AD and max assist, therapist assisting with R LE placement to limit hip adduction and therapist blocking R knee during stance. Pt ascended/descended 4 steps with max assist using L rail with step to pattern and cues for techniques, therapist blocking R knee and assisting with R LE advancement. Pt transported to the ortho gym and performed car transfer this session stand pivot w/c<>car with mod assist. Pt transported back to the gym. Pt worked on blocked practice of stand pivot transfers from w/c<>mat with mod assist fading to min assist with emphasis on slow controlled movements and cues for techniques and R LE awareness/placement. Pt performed x 10 sit<>stands without UE support working on R LE strength and weightbearing, cues for techniques and slow/controlled movements. Pt worked on R quad activation and knee control while performing pre-gait stepping in place with L LE x 2 trials with  mod assist and tactile cues. Pt performed seated R LE LAQ x 10 and then heel slides x 10 for neuro re-ed. Pt transferred back to w/c with min assist squat pivot and transported back to room. Stand pivot to bed with mod assist and sit>supine with min assist. Pt with increased extensor tone noted in R LE. Pt left supine in bed with needs in reach and bed alarm set.    PT Evaluation Precautions/Restrictions Precautions Precautions: Fall Precaution Comments: right UE and LE hemiparesis Restrictions Weight Bearing Restrictions: No Pain Pain Assessment Pain Scale: Faces Pain Score: 0-No pain Home Living/Prior Functioning Home Living Available Help at Discharge: Available 24 hours/day;Family Type of Home: Apartment Home Access: Stairs to enter Entrance Stairs-Number of Steps: 10 Entrance Stairs-Rails: Right Home Layout: One level Bathroom Shower/Tub: Chiropodist: Standard Bathroom Accessibility: Yes  Lives With: Significant other Prior Function Level of Independence: Independent with basic ADLs;Independent with gait  Able to Take Stairs?: Yes Driving: No 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, walks a mile to work everyday Vision/Perception  Vision - Assessment Ocular Range of Motion: Within Functional Limits Alignment/Gaze Preference: Head turned(slight left head turn at times) Tracking/Visual Pursuits: Other (comment)(Lost fixation bilaterally when scanning in the left superior field) Convergence: Within functional limits Perception Perception: Impaired Inattention/Neglect: Does not attend to right visual field Praxis Praxis: Intact  Cognition Overall Cognitive Status: Impaired/Different from baseline Arousal/Alertness: Awake/alert Orientation Level: Oriented X4 Attention: Focused;Sustained Focused Attention: Appears intact Sustained Attention: Appears intact Memory: Appears intact Awareness: Impaired Awareness  Impairment: Intellectual impairment Problem Solving Impairment: Functional basic;Functional complex Behaviors: Impulsive Safety/Judgment: Impaired Comments: Pt moves impulsively fast at times.  Demonstrated decreased awareness of speech deficits when asked but did state RUE and RLE weakness. Sensation Sensation Light Touch: Impaired Detail Light Touch Impaired Details: Impaired RUE;Impaired LUE Proprioception: Impaired Detail Proprioception Impaired Details: Impaired RUE;Impaired RLE Stereognosis: Not tested Additional Comments: diminished sensation R UE/LE Coordination Gross Motor Movements are Fluid and Coordinated: No Fine Motor Movements are Fluid and Coordinated: No Coordination and Movement Description: Pt with Brunnstrum stage III movement in the right arm with stage II in the hand with increased digit flexor tone.  Needs max hand  over hand for integration as a stabilizer currently. Heel Shin Test: imapired R LE Motor  Motor Motor: Hemiplegia Motor - Skilled Clinical Observations: RUE and RLE hemiparesis  Mobility Bed Mobility Bed Mobility: Supine to Sit;Sit to Supine Supine to Sit: Minimal Assistance - Patient > 75% Sit to Supine: Minimal Assistance - Patient > 75% Transfers Transfers: Stand to [employer] Pivot Transfers Stand to Sit: Minimal Assistance - Patient > 75% Stand Pivot Transfers: Moderate Assistance - Patient 50 - 74% Stand Pivot Transfer Details: Verbal cues for precautions/safety;Verbal cues for technique Transfer (Assistive device): None Locomotion  Gait Ambulation: Yes Gait Assistance: Maximal Assistance - Patient 25-49% Gait Distance (Feet): 20 Feet Assistive device: None Gait Assistance Details: Verbal cues for gait pattern;Verbal cues for precautions/safety;Verbal cues for safe use of DME/AE Gait Gait: Yes Gait Pattern: Impaired Gait Pattern: Step-to pattern;Decreased step length - left;Decreased stance time - right;Scissoring;Narrow base of  support;Poor foot clearance - right Stairs / Additional Locomotion Stairs: Yes Stairs Assistance: Maximal Assistance - Patient 25 - 49% Stair Management Technique: One rail Left Number of Stairs: 4 Height of Stairs: 6 Wheelchair Mobility Wheelchair Mobility: Yes Wheelchair Assistance: Engineer, maintenance (IT) Propulsion: Left upper extremity;Left lower extremity Wheelchair Parts Management: Needs assistance Distance: 150 ft  Trunk/Postural Assessment  Cervical Assessment Cervical Assessment: Within Functional Limits Thoracic Assessment Thoracic Assessment: Within Functional Limits Lumbar Assessment Lumbar Assessment: Within Functional Limits Postural Control Postural Control: Deficits on evaluation Protective Responses: impaired  Balance Balance Balance Assessed: Yes Static Sitting Balance Static Sitting - Balance Support: Feet supported Static Sitting - Level of Assistance: 5: Stand by assistance Dynamic Sitting Balance Dynamic Sitting - Balance Support: Feet unsupported;During functional activity Dynamic Sitting - Level of Assistance: 4: Min assist Static Standing Balance Static Standing - Balance Support: During functional activity Static Standing - Level of Assistance: 4: Min assist;3: Mod assist Dynamic Standing Balance Dynamic Standing - Balance Support: No upper extremity supported;During functional activity Dynamic Standing - Level of Assistance: 3: Mod assist;2: Max assist Extremity Assessment  RLE Assessment RLE Assessment: Exceptions to [facility] Passive Range of Motion (PROM) Comments: DF to neutral General Strength Comments: impaired, hemiparesis RLE Strength Right Hip Flexion: 2+/5 Right Hip Extension: 3/5 Right Knee Flexion: 2/5 Right Knee Extension: 3/5 Right Ankle Dorsiflexion: 0/5 Right Ankle Plantar Flexion: 0/5 LLE Assessment LLE Assessment: Within Functional Limits    Refer to Care Plan for Long Term Goals  Recommendations for  other services: Neuropsych  Discharge Criteria: Patient will be discharged from PT if patient refuses treatment 3 consecutive times without medical reason, if treatment goals not met, if there is a change in medical status, if patient makes no progress towards goals or if patient is discharged from hospital.  The above assessment, treatment plan, treatment alternatives and goals were discussed and mutually agreed upon: by patient  Netta Corrigan, PT, DPT 03/05/2019, 10:29 AM

## 2019-03-05 NOTE — Evaluation (Addendum)
Occupational Therapy Assessment and Plan  Patient Details  Name: Casey Petty MRN: [id number] Date of Birth: 1961-03-12  OT Diagnosis: cognitive deficits, hemiplegia affecting dominant side and muscle weakness (generalized) Rehab Potential: Rehab Potential (ACUTE ONLY): Excellent ELOS: 14-16 days   Today's Date: 03/05/2019 OT Individual Time: 0800-0900 OT Individual Time Calculation (min): 60 min     Problem List:  Patient Active Problem List   Diagnosis Date Noted  . 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 Stroke ([place]) 02/28/2019  . Essential hypertension 02/28/2019  . Tobacco abuse 02/28/2019  . Hyperglycemia 02/28/2019    Past Medical History:  Past Medical History:  Diagnosis Date  . Hypertension    Past Surgical History: History reviewed. No pertinent surgical history.  Assessment & Plan Clinical Impression: Patient is a 57 y.o. year old male with recent admission to the hospital on 03/01/2019 with 2-day history of right-sided weakness with RLE pain and difficulty walkingand slurred speech.Blood pressure elevated at admission andUDS negative. MRI lumbar spine showed moderate bilateral L5 foraminal stenosis and mild to moderate facet arthropathy more pronounced at L4-L5. 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  Patient transferred to CIR on 03/04/2019 .    Patient currently requires mod with basic self-care skills secondary to muscle weakness, impaired timing and sequencing, abnormal tone, unbalanced muscle activation and decreased coordination, decreased attention to right, decreased awareness, decreased safety awareness and decreased memory and decreased sitting balance, decreased standing balance, decreased postural control, hemiplegia and decreased balance strategies.  Prior to hospitalization, patient could complete ADLs with  independent .  Patient will benefit from skilled intervention to decrease level of assist with basic self-care skills and increase independence with basic self-care skills prior to discharge home with care partner.  Anticipate patient will require 24 hour supervision and follow up home health.  OT - End of Session Activity Tolerance: Tolerates 30+ min activity with multiple rests Endurance Deficit: Yes OT Assessment Rehab Potential (ACUTE ONLY): Excellent OT Barriers to Discharge: Inaccessible home environment OT Barriers to Discharge Comments: Has 10 steps up to apartment OT Patient demonstrates impairments in the following area(s): Balance;Cognition;Safety;Perception;Pain;Sensory;Motor OT Basic ADL's Functional Problem(s): Toileting;Dressing;Bathing;Grooming;Eating OT Advanced ADL's Functional Problem(s): Simple Meal Preparation OT Transfers Functional Problem(s): Toilet;Tub/Shower OT Additional Impairment(s): Fuctional Use of Upper Extremity OT Plan OT Intensity: Minimum of 1-2 x/day, 45 to 90 minutes OT Frequency: 5 out of 7 days OT Duration/Estimated Length of Stay: 10-12 days OT Treatment/Interventions: Balance/vestibular training;Discharge planning;Functional electrical stimulation;Self Care/advanced ADL retraining;Therapeutic Activities;UE/LE Coordination activities;Pain management;Cognitive remediation/compensation;Functional mobility training;Patient/family education;Therapeutic Exercise;Visual/perceptual remediation/compensation;Community reintegration;DME/adaptive equipment instruction;Neuromuscular re-education;Psychosocial support;Splinting/orthotics;UE/LE Strength taining/ROM;Wheelchair propulsion/positioning OT Self Feeding Anticipated Outcome(s): modified independent OT Basic Self-Care Anticipated Outcome(s): supervision OT Toileting Anticipated Outcome(s): supervision OT Bathroom Transfers Anticipated Outcome(s): supervision OT Recommendation Recommendations for Other  Services: Therapeutic Recreation consult Therapeutic Recreation Interventions: Stress management Patient destination: Home Follow Up Recommendations: Home health OT;24 hour supervision/assistance Equipment Recommended: To be determined   Skilled Therapeutic Intervention Pt worked on selfcare retraining sit to stand from the EOB.  Max hand over hand for integration of the RUE into bathing tasks.  Increased LOB to the right and posteriorly when lifting his LLE up for washing and donning gripper sock.  Min assist for sit to stand with decreased ability to consistently keep the right knee from buckling with weightbearing.  Mod assist for donning shirt and pants with therapist beginning education on hemi dressing techniques.  Pt with some dysarthric speech requiring him to repeat phrases at times in order for therapist to understand.  Provided wheelchair and half lap tray for RUE support.  Pt also educated on RUE AAROM exercises for the shoulder and elbow at end of session.  Discussed expectations of LOS and supervision goals which pt is receptive of.  Pt left with call button and phone in reach with safety alarm pad in place.    OT Evaluation Precautions/Restrictions  Precautions Precautions: Fall Precaution Comments: right UE and LE hemiparesis Restrictions Weight Bearing Restrictions: No  Pain Pain Assessment Pain Scale: Faces Pain Score: 0-No pain Home Living/Prior Functioning Home Living Available Help at Discharge: Available 24 hours/day, Family Type of Home: Apartment Home Access: Stairs to enter Technical brewer of Steps: 10 Entrance Stairs-Rails: Right Home Layout: One level Bathroom Shower/Tub: Optometrist: Yes  Lives With: Significant other IADL History Homemaking Responsibilities: Yes Meal Prep Responsibility: Secondary Current License: No Education: High school  Occupation: Full time employment Type of  Occupation: Worked for [employer] Leisure and Hobbies: Likes to play dominoes and chess Prior Function Level of Independence: Independent with basic ADLs  Able to Take Stairs?: Yes Driving: No Vocation: Full time employment ADL ADL Eating: Set up Where Assessed-Eating: Bed level Grooming: Setup Where Assessed-Grooming: Wheelchair Upper Body Bathing: Minimal assistance Where Assessed-Upper Body Bathing: Edge of bed Lower Body Bathing: Minimal assistance Where Assessed-Lower Body Bathing: Edge of bed Upper Body Dressing: Moderate assistance Where Assessed-Upper Body Dressing: Edge of bed Lower Body Dressing: Moderate assistance Where Assessed-Lower Body Dressing: Edge of bed Toileting: Moderate assistance Where Assessed-Toileting: Bedside Commode Toilet Transfer: Moderate assistance Toilet Transfer Method: Stand pivot Toilet Transfer Equipment: Bedside commode Vision Baseline Vision/History: No visual deficits Patient Visual Report: No change from baseline Vision Assessment?: Yes Ocular Range of Motion: Within Functional Limits Alignment/Gaze Preference: Head turned(slight left head turn at times) Tracking/Visual Pursuits: Other (comment)(Lost fixation bilaterally when scanning in the left superior field) Convergence: Within functional limits Visual Fields: No apparent deficits Perception  Perception: Impaired Inattention/Neglect: Does not attend to right visual field Praxis Praxis: Intact Cognition Overall Cognitive Status: Impaired/Different from baseline Arousal/Alertness: Awake/alert Orientation Level: Person;Place;Situation Person: Oriented Place: Oriented Situation: Oriented Year: 2020 Month: July Day of Week: Correct Memory: Appears intact Immediate Memory Recall: Sock;Blue;Bed Memory Recall Sock: Without Cue Memory Recall Blue: Without Cue Memory Recall Bed: Without Cue Attention: Focused;Sustained Focused Attention: Appears intact Sustained Attention: Appears  intact Awareness: Impaired Awareness Impairment: Intellectual impairment Problem Solving Impairment: Functional basic;Functional complex Behaviors: Impulsive Safety/Judgment: Impaired Comments: Pt moves impulsively fast at times.  Demonstrated decreased awareness of speech deficits when asked but did state RUE and RLE weakness. Sensation Sensation Light Touch: Impaired Detail Light Touch Impaired Details: Impaired RUE Proprioception: Impaired Detail Proprioception Impaired Details: Impaired RUE Stereognosis: Not tested Coordination Gross Motor Movements are Fluid and Coordinated: No Fine Motor Movements are Fluid and Coordinated: No Coordination and Movement Description: Pt with Brunnstrum stage III movement in the right arm with stage II in the hand with increased digit flexor tone.  Needs max hand over hand for integration as a stabilizer currently. Motor  Motor Motor: Hemiplegia Motor - Skilled Clinical Observations: RUE and RLE hemiparesis Mobility  Bed Mobility Bed Mobility: Supine to Sit;Sit to Supine Supine to Sit: Moderate Assistance - Patient 50-74% Sit to Supine: Minimal Assistance - Patient > 75% Transfers Stand to Sit: Minimal Assistance - Patient > 75%  Trunk/Postural Assessment  Cervical Assessment Cervical Assessment:  Exceptions to WFL(slight left head turn with tilt at times) Thoracic Assessment Thoracic Assessment: Within Functional Limits Lumbar Assessment Lumbar Assessment: Within Functional Limits Postural Control Postural Control: Within Functional Limits  Balance Balance Balance Assessed: Yes Static Sitting Balance Static Sitting - Balance Support: Feet supported Static Sitting - Level of Assistance: 5: Stand by assistance Dynamic Sitting Balance Dynamic Sitting - Balance Support: Feet unsupported;During functional activity Dynamic Sitting - Level of Assistance: 4: Min assist Static Standing Balance Static Standing - Balance Support: During  functional activity Static Standing - Level of Assistance: 4: Min assist Dynamic Standing Balance Dynamic Standing - Balance Support: During functional activity Dynamic Standing - Level of Assistance: 3: Mod assist Extremity/Trunk Assessment RUE Assessment RUE Assessment: Exceptions to [facility] Strength Comments: Brunnstrum stage III in the arm and stage II in the hand.  Increased flexor tone is noted in the digits of the hand with no active movement noted to command.  He can elicit synergy pattern at the shoulder and elbow but needs max assist to integrate into function as a gross assist. LUE Assessment LUE Assessment: Within Functional Limits     Refer to Care Plan for Long Term Goals  Recommendations for other services: Therapeutic Recreation  Stress management   Discharge Criteria: Patient will be discharged from OT if patient refuses treatment 3 consecutive times without medical reason, if treatment goals not met, if there is a change in medical status, if patient makes no progress towards goals or if patient is discharged from hospital.  The above assessment, treatment plan, treatment alternatives and goals were discussed and mutually agreed upon: by patient and by family  Matisha Termine OTR/L 03/05/2019, 12:12 PM

## 2019-03-05 NOTE — Plan of Care (Signed)
  Problem: Consults Goal: RH STROKE PATIENT EDUCATION Description: See Patient Education module for education specifics  Outcome: Progressing   Problem: RH BOWEL ELIMINATION Goal: RH STG MANAGE BOWEL WITH ASSISTANCE Description: STG Manage Bowel with Mod I Assistance. Outcome: Progressing   Problem: RH BLADDER ELIMINATION Goal: RH STG MANAGE BLADDER WITH ASSISTANCE Description: STG Manage Bladder Independently  Outcome: Progressing   Problem: RH SAFETY Goal: RH STG ADHERE TO SAFETY PRECAUTIONS W/ASSISTANCE/DEVICE Description: STG Adhere to Safety Precautions With Cues and Supervision  Outcome: Progressing   Problem: RH KNOWLEDGE DEFICIT Goal: RH STG INCREASE KNOWLEDGE OF HYPERTENSION Description: Patient will be able to manage HTN independently using educational handouts.   Outcome: Progressing Goal: RH STG INCREASE KNOWLEDGE OF STROKE PROPHYLAXIS Description: Patient will be able to state management of secondary stroke prevention independently.  Outcome: Progressing   Problem: RH KNOWLEDGE DEFICIT Goal: RH STG INCREASE KNOWLEGDE OF HYPERLIPIDEMIA Description: Patient will be able to state management of HLD Independently  Outcome: Progressing

## 2019-03-05 NOTE — Evaluation (Signed)
Speech Language Pathology Assessment and Plan  Patient Details  Name: Casey Petty MRN: [id number] Date of Birth: 04/01/1961  SLP Diagnosis: Dysphagia;Cognitive Impairments;Dysarthria  Rehab Potential: Excellent ELOS: 14-16 days    Today's Date: 03/05/2019 SLP Individual Time: 4098-1191 SLP Individual Time Calculation (min): 55 min   Problem List:  Patient Active Problem List   Diagnosis Date Noted  . 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 Stroke ([place]) 02/28/2019  . Essential hypertension 02/28/2019  . Tobacco abuse 02/28/2019  . Hyperglycemia 02/28/2019   Past Medical History:  Past Medical History:  Diagnosis Date  . Hypertension    Past Surgical History: History reviewed. No pertinent surgical history.  Assessment / Plan / Recommendation Clinical Impression Patient is a 58 year old male with history of HTN--no meds X 3 yearswho was admitted on 03/01/2019 with 2-day history of right-sided weakness with RLE pain and difficulty walkingand slurred speech.Blood pressure elevated at admission andUDS negative. MRI lumbar spine showed moderate bilateral L5 foraminal stenosis and mild to moderate facet arthropathy more pronounced at L4-L5. 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 2 D echo done revealing mild concentric LVH with EF >65% and no shunt. Carotid dopplers were negative for significant ICA stenosis. Dr. [name] felt that stroke was due to small vessel disease and recommended ASA/Plavix for 3 weeks followed by ASA alone. Blood pressures continue to be labile and medications being titrated. He developed angioedema with addition of Lisinopril therefore this was discontinued and he received IV solumedrol with improvement. Patient with anarthria/dysarthria with mild receptive deficits and left sided weakness affecting mobility and ADLs. CIR  recommended due to functional decline and patient admitted 03/04/19.  Patient demonstrates a moderate dysarthria impacting speech intelligibility at the phrase level to ~50% due to an increased speech rate secondary to impulsivity and imprecise consonants secondary to right oral-motor weakness. Impulsivity and right oral-motor weakness also results in prolonged mastication and right lateral sulci pocketing and anterior spillage with solids that patient requires cues to self-monitor and correct. No overt s/s of aspiration were noted with thin liquids but one episode of overt coughing noted with solid textures due to mixed consistencies (tomato). Recommend patient continue current diet of regular textures with thin liquids with intermittent supervision for use of swallowing compensatory strategies. MIld-moderate cognitive deficits were also noted in sustained attention, functional problem solving, recall and awareness which impacts his safety with functional and familiar tasks.  Patient would benefit from skilled SLP intervention to maximize his cognitive, swallowing and speech function prior to discharge.    Skilled Therapeutic Interventions          Administered a cognitive-linguistic evaluation and BSE, please see above for details. Educated patient in regards to his current cognitive, speech and swallowing deficits and goals of skilled SLP intervention. Patient verbalized understanding and agreement.   SLP Assessment  Patient will need skilled Speech Lanaguage Pathology Services during CIR admission    Recommendations  SLP Diet Recommendations: Age appropriate regular solids;Thin Liquid Administration via: Cup;Straw Medication Administration: Whole meds with liquid Supervision: Patient able to self feed;Intermittent supervision to cue for compensatory strategies Compensations: Minimize environmental distractions;Slow rate;Small sips/bites;Lingual sweep for clearance of pocketing;Monitor for anterior  loss Postural Changes and/or Swallow Maneuvers: Seated upright 90 degrees Oral Care Recommendations: Oral care BID Recommendations for Other Services: Neuropsych consult Patient destination: Home Follow up Recommendations: Home Health SLP;24 hour supervision/assistance Equipment Recommended: None recommended by  SLP    SLP Frequency 3 to 5 out of 7 days   SLP Duration  SLP Intensity  SLP Treatment/Interventions 14-16 days  Minumum of 1-2 x/day, 30 to 90 minutes  Cognitive remediation/compensation;Dysphagia/aspiration precaution training;Speech/Language facilitation;Cueing hierarchy;Environmental controls;Therapeutic Activities;Patient/family education;Functional tasks;Internal/external aids    Pain No/Denies Pain  Short Term Goals: Week 1: SLP Short Term Goal 1 (Week 1): Patient will consume current diet with minimal overt s/s of aspiration with supervision level verbal cues for use of swallowing compensatory strategies. SLP Short Term Goal 2 (Week 1): Patient will utilize speech intelligibility strategies at the phrase level with Mod A verbal cues to achieve ~75% intelligibility. SLP Short Term Goal 3 (Week 1): Patient will demonstrate sustained attention to functional tasks for 30 minutes with Min A verbal cues. SLP Short Term Goal 4 (Week 1): Patient will recall new, daily information with supervision level verbal and visual cues. SLP Short Term Goal 5 (Week 1): Patient will self-monitor and correct errors during functional tasks with Min A verbal cues. SLP Short Term Goal 6 (Week 1): Patient will demonstrate functional problem solving with basic and familiar tasks with Min A verbal cues.  Refer to Care Plan for Long Term Goals  Recommendations for other services: Neuropsych  Discharge Criteria: Patient will be discharged from SLP if patient refuses treatment 3 consecutive times without medical reason, if treatment goals not met, if there is a change in medical status, if patient  makes no progress towards goals or if patient is discharged from hospital.  The above assessment, treatment plan, treatment alternatives and goals were discussed and mutually agreed upon: by patient  Casey Petty 03/05/2019, 2:58 PM

## 2019-03-05 NOTE — Progress Notes (Signed)
Inpatient Rehabilitation  Patient information reviewed and entered into eRehab system by Jakaila Norment M. Shamirah Ivan, M.A., CCC/SLP, PPS Coordinator.  Information including medical coding, functional ability and quality indicators will be reviewed and updated through discharge.    

## 2019-03-05 NOTE — Care Management Note (Signed)
[facility] Individual Statement of Services  Patient Name:  Casey Petty  Date:  03/05/2019  Welcome to the [facility].  Our goal is to provide you with an individualized program based on your diagnosis and situation, designed to meet your specific needs.  With this comprehensive rehabilitation program, you will be expected to participate in at least 3 hours of rehabilitation therapies Monday-Friday, with modified therapy programming on the weekends.  Your rehabilitation program will include the following services:  Physical Therapy (PT), Occupational Therapy (OT), Speech Therapy (ST), 24 hour per day rehabilitation nursing, Neuropsychology, Case Management (Social Worker), Rehabilitation Medicine, Nutrition Services and Pharmacy Services  Weekly team conferences will be held on Wednesday to discuss your progress.  Your Social Worker will talk with you frequently to get your input and to update you on team discussions.  Team conferences with you and your family in attendance may also be held.  Expected length of stay: 10-12 days  Overall anticipated outcome: supervision-CGA level  Depending on your progress and recovery, your program may change. Your Social Worker will coordinate services and will keep you informed of any changes. Your Social Worker's name and contact numbers are listed  below.  The following services may also be recommended but are not provided by the [facility] will be made to provide these services after discharge if needed.  Arrangements include referral to agencies that provide these services.  Your insurance has been verified to be:  Applying for Medicaid Your primary doctor is:  None  Pertinent information will be shared with your doctor and your insurance  company.  Social Worker:  Ovidio Kin, [place] or ([phone]  Information discussed with and copy given to patient by: Elease Hashimoto, 03/05/2019, 1:12 PM

## 2019-03-05 NOTE — Progress Notes (Signed)
Social Work Assessment and Plan   Patient Details  Name: Casey Petty MRN: [id number] Date of Birth: 08/12/1960  Today's Date: 03/05/2019  Problem List:  Patient Active Problem List   Diagnosis Date Noted  . 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 Stroke ([place]) 02/28/2019  . Essential hypertension 02/28/2019  . Tobacco abuse 02/28/2019  . Hyperglycemia 02/28/2019   Past Medical History:  Past Medical History:  Diagnosis Date  . Hypertension    Past Surgical History: History reviewed. No pertinent surgical history. Social History:  reports that he has been smoking. He has never used smokeless tobacco. He reports current alcohol use of about 2.0 standard drinks of alcohol per week. He reports that he does not use drugs.  Family / Support Systems Marital Status: Single Patient Roles: Partner, Parent, Other (Comment)(employee) Spouse/Significant Other: Katharine Look Collins-girlfriend [phone] Children: Four grown children-supportive Other Supports: Freddericka Snipe-sister [phone] Anticipated Caregiver: Katharine Look Ability/Limitations of Caregiver: No limitations-retired Caregiver Availability: 24/7 Family Dynamics: Close knit with children and Katharine Look, all will assist him if needed. he has co-workers and friends who are involved and supportive. He wants to be able to do for himself when he leaves here due to not wanting to ask for assist  Social History Preferred language: English Religion:  Cultural Background: No issues Education: HS Read: Yes Write: Yes Employment Status: Employed Name of Employer: [employer] Return to Work Plans: unsure if will be able to-will need to see how he recovers Public relations account executive Issues: No issues Guardian/Conservator: None-according to MD pt is capable of making his own decisions while here   Abuse/Neglect Abuse/Neglect Assessment Can Be Completed: Yes Physical Abuse: Denies Verbal Abuse: Denies Sexual Abuse:  Denies Exploitation of patient/patient's resources: Denies Self-Neglect: Denies  Emotional Status Pt's affect, behavior and adjustment status: Pt is motivated to recover and regain his independence to be able to care ofr himself. He has always been able to do this and plans to again. He does have a girlfriend willing to help along with children who are involved. Recent Psychosocial Issues: No issues Psychiatric History: No history deferred depression screen due to coping appropriately at this time. Do feel he would benefit from seeing neuro-psych while here due to young age and for coping. Will make referral while here. Substance Abuse History: No issues  Patient / Family Perceptions, Expectations & Goals Pt/Family understanding of illness & functional limitations: Pt and Katharine Look have a good understanding of his stroke and have spoken with the MD while he has been in the hospital. He has made some progress which is encouraging to him. He is hopeful he will do well here. Premorbid pt/family roles/activities: Boyfriend, father, employee, friend,sibling, etc Anticipated changes in roles/activities/participation: resume Pt/family expectations/goals: Pt states: " I want to be able to do for myself when I leave here and I have to get up a flight of stairs to get to my home."  Katharine Look states: " I hope he can walk and not require any lifting I can't really do that."  [employer]: None Premorbid Home Care/DME Agencies: None Transportation available at discharge: Pt was driving [employer] referrals recommended: Neuropsychology, Support group (specify)  Discharge Planning Living Arrangements: Spouse/significant other Support Systems: Spouse/significant other, Children, Other relatives, Friends/neighbors Type of Residence: Private residence Insurance Resources: Self-pay(Applying for [employer]) Museum/gallery curator Resources: Employment, Secondary school teacher Screen  Referred: No Living Expenses: Education officer, community Management: Patient, Significant Other Does the patient have any problems obtaining your medications?: Yes (Describe)(uninsured) Home Management: Katharine Look  Patient/Family Preliminary Plans: Return home with Katharine Look whom he lives with, who is retired and can assist if needed. Pt hopes to be mod/i before going home so he can care for himself. He is doing well already. Will await team's evaluations and work on a plan for DC.  Clinical Impression Pleasant gentleman who is motivated to do well and recover from this stroke. His family and girlfriend are involved and will assist if needed. Has already begun the SSD and Medicaid process. Will await team's evaluations and have neuro-psych see while here.  Elease Hashimoto 03/05/2019, 1:10 PM

## 2019-03-05 NOTE — Progress Notes (Addendum)
[facility] PHYSICAL MEDICINE & REHABILITATION PROGRESS NOTE   Subjective/Complaints:  Patient severely dysarthric, denies any pain complaints.  Does well with yes no responses  Review of systems is limited by his communication, denies breathing problems pains bowel or bladder issues except for constipation  Objective:   No results found. Recent Labs    03/04/19 0549 03/05/19 0702  WBC 17.8* 20.6*  HGB 15.8 15.3  HCT 47.1 46.3  PLT 222 222   Recent Labs    03/04/19 0549 03/05/19 0702  NA 137 138  K 4.0 4.3  CL 103 100  CO2 23 28  GLUCOSE 241* 151*  BUN 17 19  CREATININE 1.26* 1.18  CALCIUM 9.6 9.6    Intake/Output Summary (Last 24 hours) at 03/05/2019 0913 Last data filed at 03/05/2019 0528 Gross per 24 hour  Intake 120 ml  Output 950 ml  Net -830 ml     Physical Exam: Vital Signs Blood pressure (!) 172/93, pulse 70, temperature 98.3 F (36.8 C), temperature source Oral, resp. rate 16, height 5\' 7"  (1.702 m), weight 72.4 kg, SpO2 99 %.  General: No acute distress Mood and affect are appropriate Heart: Regular rate and rhythm no rubs murmurs or extra sounds Lungs: Clear to auscultation, breathing unlabored, no rales or wheezes Abdomen: Positive bowel sounds, soft nontender to palpation, nondistended Extremities: No clubbing, cyanosis, or edema Skin: No evidence of breakdown, no evidence of rash Neurologic: Cranial nerves II through XII intact, motor strength is 5/5 in left and 0/5 right  deltoid, bicep,2- tricep,0 grip,0 hip flexor,2- knee extensors, 0ankle dorsiflexor and plantar flexor  Sensory exam normal sensation to light touch and proprioception in bilateral upper and lower extremities Cerebellar exam normal finger to nose to finger as well as heel to shin in Left  upper and lower extremities unable to perform on RIght due to weakness Musculoskeletal: Full range of motion in all 4 extremities. No joint swelling   Assessment/Plan: 1. Functional  deficits secondary to Left int capsule infarct  which require 3+ hours per day of interdisciplinary therapy in a comprehensive inpatient rehab setting.  Physiatrist is providing close team supervision and 24 hour management of active medical problems listed below.  Physiatrist and rehab team continue to assess barriers to discharge/monitor patient progress toward functional and medical goals  Care Tool:  Bathing    Body parts bathed by patient: Right arm, Chest, Abdomen, Front perineal area, Buttocks, Right upper leg, Left upper leg, Left lower leg, Face   Body parts bathed by helper: Right lower leg, Left arm     Bathing assist Assist Level: Minimal Assistance - Patient > 75%     Upper Body Dressing/Undressing Upper body dressing   What is the patient wearing?: Pull over shirt    Upper body assist Assist Level: Moderate Assistance - Patient 50 - 74%    Lower Body Dressing/Undressing Lower body dressing      What is the patient wearing?: Pants     Lower body assist Assist for lower body dressing: Moderate Assistance - Patient 50 - 74%     Toileting Toileting    Toileting assist Assist for toileting: Moderate Assistance - Patient 50 - 74%     Transfers Chair/bed transfer  Transfers assist  Chair/bed transfer activity did not occur: Safety/medical concerns  Chair/bed transfer assist level: Moderate Assistance - Patient 50 - 74%     Locomotion Ambulation   Ambulation assist  Walk 10 feet activity   Assist           Walk 50 feet activity   Assist           Walk 150 feet activity   Assist           Walk 10 feet on uneven surface  activity   Assist           Wheelchair     Assist               Wheelchair 50 feet with 2 turns activity    Assist            Wheelchair 150 feet activity     Assist          Medical Problem List and Plan: 1.Right hemiparesis and functional  deficitssecondary to left subcortical lacunar infarct.  -CIR PT, OT team conf in am  2. Antithrombotics: -DVT/anticoagulation:Pharmaceutical:Lovenox -antiplatelet therapy: ASA/Plavix x3 weeks followed by ASA alone 3. Pain Management:N/A 4. Mood:LCSW to follow for evaluation and support -antipsychotic agents: N/A 5. Neuropsych: This patientiscapable of making decisions on hisown behalf.may need neuropsych for adjustment  6. Skin/Wound Care:Routine pressure re ief measures. Maintain adequate nutrition and hydration status 7. Fluids/Electrolytes/Nutrition:Monitor I/O.  Lytes are normal 8. HTN: Monitor BP bid. Continue Norvasc,  9. Dyslipidemia: On Lipitor.  10. Acute angioedema (face/lips): Resolved with d/c ACE and IV solumedrol--2 more dose to complete per recs. Will resume regular texturefoods. 11.Leucocytosis: Likely due to steroids. Monitor for now.  12,  Constipation will adjust laxatives  LOS: 1 days A FACE TO FACE EVALUATION WAS PERFORMED  Charlett Blake 03/05/2019, 9:13 AM

## 2019-03-06 ENCOUNTER — Inpatient Hospital Stay (HOSPITAL_COMMUNITY): Payer: Medicaid Other

## 2019-03-06 ENCOUNTER — Inpatient Hospital Stay (HOSPITAL_COMMUNITY): Payer: Medicaid Other | Admitting: Speech Pathology

## 2019-03-06 ENCOUNTER — Inpatient Hospital Stay (HOSPITAL_COMMUNITY): Payer: Medicaid Other | Admitting: Occupational Therapy

## 2019-03-06 MED ORDER — BLOOD PRESSURE CONTROL BOOK
Freq: Once | Status: AC
  Administered 2019-03-06: 08:00:00
  Filled 2019-03-06: qty 1

## 2019-03-06 NOTE — Progress Notes (Signed)
Physical Therapy Session Note  Patient Details  Name: Casey Petty MRN: [id number] Date of Birth: 1960/08/17  Today's Date: 03/06/2019 PT Individual Time: 1445-1545 PT Individual Time Calculation (min): 60 min   Short Term Goals: Week 1:  PT Short Term Goal 1 (Week 1): Pt will perform bed<>chair transfer with min assist PT Short Term Goal 2 (Week 1): Pt will ambulated x 100 ft with LRAD and min assist PT Short Term Goal 3 (Week 1): Pt will participate in berg balance test for assessment of balance deficits  Skilled Therapeutic Interventions/Progress Updates:    Pt supine in bed upon PT arrival, agreeable to therapy tx and denies pain. Pt transferred to sitting EOB with supervision, pt donned shoes while sitting with assist to don R shoe. Pt performed stand pivot to w/c with mod assist and propelled w/c to the gym with supervision x 150 ft using L hemi technique with cues for attention to R side. Squat pivot to the mat with min assist. Pt worked on R LE neuro re-ed this session with emphasis on R quad activation and knee control. Pt performed 2 x 10 mini squats with mirror for visual feedback and cues for mindline orientation and awareness of R sided deficits. Pt worked on pre-gait and R LE stance control while performing L LE stepping in place, mirror for visual feedback, tactile cues and verbal cues for timing of quad activation, pt with poor ability to maintain full knee extension, x 3 trials with mod assist. Pt worked on standing balance without UE support while engaging R quads and performing overhead reaching activity for horseshoes x 2 trials. Pt worked on R LE stance control and weightbearing while performing toe taps on 2 inch step with L LE using RW for UE support. Pt worked on standing balance and R lateral weightshift while performing standing horseshoe toss with 2 inch step under L LE, min-mod assist. Pt transferred to supine on mat with min assist then rolled to prone with supervision.  Pt transferred prone>quadruped>tall kneeling with mod assist. In tall kneeling pt worked on postural control and hip strength to perform reaching task. In tall kneeling pt worked on core strength, postural control and hip strength to perform forwards walking on knees and then sidestepping on knees. Pt transferred to quadruped with mod assist for R UE placement and to maintain R UE elbow extension for weightbearing. Pt transferred back to supine and then sitting edge of mat with min assist. Transferred to w/c min assist and transported back to room. Stand pivot to bed with min assist and sit>supine min assist, left with needs in reach and bed alarm set.   Therapy Documentation Precautions:  Precautions Precautions: Fall Precaution Comments: right UE and LE hemiparesis Restrictions Weight Bearing Restrictions: No   Therapy/Group: Individual Therapy  Netta Corrigan, PT, DPT 03/06/2019, 7:59 AM

## 2019-03-06 NOTE — Progress Notes (Signed)
Occupational Therapy Session Note  Patient Details  Name: Casey Petty MRN: [id number] Date of Birth: [birth year]  Today's Date: 03/06/2019 OT Individual Time: 5397-6734 OT Individual Time Calculation (min): 28 min    Short Term Goals: Week 1:  OT Short Term Goal 1 (Week 1): Pt will complete UB bathing with supervision following hemi technique. OT Short Term Goal 2 (Week 1): Pt will perform stand pivot transfers to the walk-in shower and toilet with min guard and use of the RW for support. OT Short Term Goal 3 (Week 1): Pt will use the RUE as a stabilizer with min assist during selfcare tasks. OT Short Term Goal 4 (Week 1): Pt will complete LB dressing with min guard assist following hemi dressing techniques.  Skilled Therapeutic Interventions/Progress Updates:    Pt completed supine to sitting with min assist.  In sitting, therapist applied NMES to the right digit flexors and extensors.  Intensity for flexors set on level 15 with extensors at level 22.  Pt was educated on activating digit extensors and flexors along with the stimulation.  Noted increased thumb flexion, even with activation of digit extension.  He also worked on active elbow extension with activation of digit extension, emphasis on not activating shoulder adduction.  He was able to tolerate 15 mins of active stimulation without any adverse reactions.  Finished session with stand step up the side of the bed with min assist and return to supine with supervision.  Resting hand splint placed on the right hand for positioning as well.  Call button and phone in reach with bed alarm in place.    Therapy Documentation Precautions:  Precautions Precautions: Fall Precaution Comments: right UE and LE hemiparesis Restrictions Weight Bearing Restrictions: No  Pain: Pain Assessment Pain Scale: Faces Pain Score: 0-No pain   Therapy/Group: Individual Therapy  Tracina Beaumont OTR/L 03/06/2019, 3:44 PM

## 2019-03-06 NOTE — Progress Notes (Signed)
Occupational Therapy Session Note  Patient Details  Name: Casey Petty MRN: [id number] Date of Birth: [birth year]  Today's Date: 03/06/2019 OT Individual Time: 2482-5003 OT Individual Time Calculation (min): 58 min    Short Term Goals: Week 1:  OT Short Term Goal 1 (Week 1): Pt will complete UB bathing with supervision following hemi technique. OT Short Term Goal 2 (Week 1): Pt will perform stand pivot transfers to the walk-in shower and toilet with min guard and use of the RW for support. OT Short Term Goal 3 (Week 1): Pt will use the RUE as a stabilizer with min assist during selfcare tasks. OT Short Term Goal 4 (Week 1): Pt will complete LB dressing with min guard assist following hemi dressing techniques.  Skilled Therapeutic Interventions/Progress Updates:    Pt completed transfer from supine to sit with min assist and then squat pivot transfer to the wheelchair with min assist as well.  He then completed oral hygiene sitting at the sink with supervision before transferring to the shower with mod assist stand pivot.  Min instructional cueing for sequencing shower with min assist for sit to stand.  Pt with max hand over hand assist for integration of the RUE for washing the left arm.  He transferred out of the shower with mod assist stand pivot to work on dressing tasks at the sink.  Min instructional cueing with min assist for donning pullover shirt.  Mod assist for brief and pants following hemi techniques as well.  Mod assist for socks and shoes to finish session.  He was left sitting in the wheelchair with call button and phone in reach with safety alarm pad in place.  RUE placed on half lap tray with resting hand splint in place as pt demonstrates increased flexor tone in the digits and thumb.    Therapy Documentation Precautions:  Precautions Precautions: Fall Precaution Comments: right UE and LE hemiparesis Restrictions Weight Bearing Restrictions: No  Pain: Pain  Assessment Pain Scale: Faces Pain Score: 0-No pain ADL: See Care Tool Section for some details of ADL  Therapy/Group: Individual Therapy  Emersen Mascari OTR/L 03/06/2019, 8:57 AM

## 2019-03-06 NOTE — Progress Notes (Signed)
Occupational Therapy Session Note  Patient Details  Name: Casey Petty MRN: [id number] Date of Birth: [birth year]  Today's Date: 03/06/2019 OT Individual Time: 2202-5427 OT Individual Time Calculation (min): 29 min    Short Term Goals: Week 1:  OT Short Term Goal 1 (Week 1): Pt will complete UB bathing with supervision following hemi technique. OT Short Term Goal 2 (Week 1): Pt will perform stand pivot transfers to the walk-in shower and toilet with min guard and use of the RW for support. OT Short Term Goal 3 (Week 1): Pt will use the RUE as a stabilizer with min assist during selfcare tasks. OT Short Term Goal 4 (Week 1): Pt will complete LB dressing with min guard assist following hemi dressing techniques.  Skilled Therapeutic Interventions/Progress Updates:    Pt in bathroom with RN upon arrival.  Pt standing with RN at grab bar while RN pulled pants over hips.  Pt returned to room in w/c.  OT intervention with focus on RUE AAROM, PROM, and AROM. Pt with elbow flexion/extension, shoulder adduction, and limited shoulder flexion.  Absent wrist flexion/extension.  Trace finger flexion (digits 1-4).  Increased flexor tone noted in thumb.  Resting hand splint donned at end of session for positioning. Pt remained in w/c with all needs within reach, half lap tray in place, and belt alarm activated.   Therapy Documentation Precautions:  Precautions Precautions: Fall Precaution Comments: right UE and LE hemiparesis Restrictions Weight Bearing Restrictions: No  Pain: Pt denies pain  Therapy/Group: Individual Therapy  Leroy Libman 03/06/2019, 10:00 AM

## 2019-03-06 NOTE — Patient Care Conference (Signed)
Inpatient RehabilitationTeam Conference and Plan of Care Update Date: 03/06/2019   Time: 11:30 AM    Patient Name: Casey Petty      Medical Record Number: [id number]  Date of Birth: Feb 28, 1961 Sex: Male         Room/Bed: 4W21C/4W21C-01 Payor Info: Payor: MEDICAID POTENTIAL / Plan: MEDICAID POTENTIAL / Product Type: *No Product type* /    Admitting Diagnosis: 4. CVA 2 Team  LT. CVA  Admit Date/Time:  03/04/2019  1:19 PM Admission Comments: No comment available   Primary Diagnosis:  <principal problem not specified> Principal Problem: <principal problem not specified>  Patient Active Problem List   Diagnosis Date Noted  . 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 Stroke ([place]) 02/28/2019  . Essential hypertension 02/28/2019  . Tobacco abuse 02/28/2019  . Hyperglycemia 02/28/2019    Expected Discharge Date: Expected Discharge Date: 03/20/19  Team Members Present: Physician leading conference: Dr. Alysia Penna Social Worker Present: Ovidio Kin, LCSW Nurse Present: Mohammed Kindle, RN PT Present: Michaelene Song, PT OT Present: Clyda Greener, OT SLP Present: Charolett Bumpers, SLP PPS Coordinator present : Ileana Ladd, PT     Current Status/Progress Goal Weekly Team Focus  Medical   Impulsivity, severe dysarthria, loose BMs  Reduce fall risk  increase awareness of R side   Bowel/Bladder   continent of bladder, incontinent of bowels; LBM: 07/28  time tolieting when awake  assist with tolieting need prn   Swallow/Nutrition/ Hydration   Reg/heart health / thin, Min A  Mod I  carryover of swallow strategies   ADL's   Min assist for bathing sit to stand with mod assist for dressing.  Mod assist for transfers without assistive device.  RUE currently Brunnstrum stage III in the arm and stage II in the hand.  He needs max hand over hand for functional use with selfcare tasks.  supervision overall  neuromuscular re-education, selfcare retraining, balance retraining, transfer  training, pt education, therapeutic exercise   Mobility   min assist bed mobility and sit<>stand, mod assist transfers, mod assist gait with RW x 20 ft, max assist gait without AD and max assist for 4 steps with single rail  supervision-min assist  balance, awareness, safety, R NMR, transfers, gait, steps   Communication   Max A Phrase level (pt and fiance state near baseline)  Supervision A sentence level  speech intelligibility strategies and word finding strategies   Safety/Cognition/ Behavioral Observations  Mod A, Min A recall  Supervision A  Basic probelm solving, emergent awareness, recall and sustained attention   Pain   no c/o pain  remain pain free  assess pain level QS and prn   Skin   skin intact  maintain skin integrity  assess skin QS and prn      *See Care Plan and progress notes for long and short-term goals.     Barriers to Discharge  Current Status/Progress Possible Resolutions Date Resolved   Physician    Other (comments);Medical stability  10 steps  slow progress  cont rehab      Nursing                  PT  Inaccessible home environment  10 STE              OT Inaccessible home environment  Has 10 steps up to apartment             SLP  SW                Discharge Planning/Teaching Needs:  HOme with girlfriend who can provide 24 hr care if needed. She will need family education prior to admission.      Team Discussion:  Goals supervision-min assist level, currently mod/max assist. Concern is the flight of steps he needs to get up to his home. MD adjusting BP meds. Speech working on Insurance claims handler. He has poor awareness of his right foot. Has telesitter now due to impulsivity. Girlfriend will need family education prior to DC home. Incontinent of bowel but continent of bladder  Revisions to Treatment Plan:  DC 8/12    Continued Need for Acute Rehabilitation Level of Care: The patient requires daily medical management by a  physician with specialized training in physical medicine and rehabilitation for the following conditions: Daily direction of a multidisciplinary physical rehabilitation program to ensure safe treatment while eliciting the highest outcome that is of practical value to the patient.: Yes Daily medical management of patient stability for increased activity during participation in an intensive rehabilitation regime.: Yes Daily analysis of laboratory values and/or radiology reports with any subsequent need for medication adjustment of medical intervention for : Neurological problems;Blood pressure problems   I attest that I was present, lead the team conference, and concur with the assessment and plan of the team. Teleconference held due to COVID 19   Dorthie Santini, Gardiner Rhyme 03/06/2019, 1:15 PM

## 2019-03-06 NOTE — Plan of Care (Signed)
  Problem: Consults Goal: RH STROKE PATIENT EDUCATION Description: See Patient Education module for education specifics  Outcome: Progressing   Problem: RH BOWEL ELIMINATION Goal: RH STG MANAGE BOWEL WITH ASSISTANCE Description: STG Manage Bowel with Mod I Assistance. Outcome: Progressing   Problem: RH BLADDER ELIMINATION Goal: RH STG MANAGE BLADDER WITH ASSISTANCE Description: STG Manage Bladder Independently  Outcome: Progressing   Problem: RH SAFETY Goal: RH STG ADHERE TO SAFETY PRECAUTIONS W/ASSISTANCE/DEVICE Description: STG Adhere to Safety Precautions With Cues and Supervision  Outcome: Progressing   Problem: RH KNOWLEDGE DEFICIT Goal: RH STG INCREASE KNOWLEDGE OF HYPERTENSION Description: Patient will be able to manage HTN independently using educational handouts.   Outcome: Progressing Goal: RH STG INCREASE KNOWLEDGE OF STROKE PROPHYLAXIS Description: Patient will be able to state management of secondary stroke prevention independently.  Outcome: Progressing   Problem: RH KNOWLEDGE DEFICIT Goal: RH STG INCREASE KNOWLEGDE OF HYPERLIPIDEMIA Description: Patient will be able to state management of HLD Independently  Outcome: Progressing

## 2019-03-06 NOTE — Progress Notes (Signed)
Social Work Patient ID: Casey Petty, male   DOB: December 02, 1960, 58 y.o.   MRN: [id number]  Met with pt and spoke with Casey Petty-girlfriend via telephone to discuss team conference goals supervision-min assist and target discharge 8/12. He feels this is way too long but is aware of the flight of stairs he needs to get up to go home. Casey Petty feels he needs to stay as long as needed and will come in for education before he goes home so she is comfortable with his care. She voiced he is stubborn and does what he wants to do. Will work on discharge needs.

## 2019-03-06 NOTE — Progress Notes (Signed)
[place] PHYSICAL MEDICINE & REHABILITATION PROGRESS NOTE   Subjective/Complaints:  Working with OT, had incont BM last noc    Review of systems is limited by his communication, denies breathing problems pains bowel or bladder issues except for constipation  Objective:   No results found. Recent Labs    03/04/19 0549 03/05/19 0702  WBC 17.8* 20.6*  HGB 15.8 15.3  HCT 47.1 46.3  PLT 222 222   Recent Labs    03/04/19 0549 03/05/19 0702  NA 137 138  K 4.0 4.3  CL 103 100  CO2 23 28  GLUCOSE 241* 151*  BUN 17 19  CREATININE 1.26* 1.18  CALCIUM 9.6 9.6    Intake/Output Summary (Last 24 hours) at 03/06/2019 0846 Last data filed at 03/06/2019 0303 Gross per 24 hour  Intake 360 ml  Output 750 ml  Net -390 ml     Physical Exam: Vital Signs Blood pressure (!) 148/88, pulse 79, temperature 98.4 F (36.9 C), temperature source Oral, resp. rate 16, height '5\' 7"'$  (1.702 m), weight 72.3 kg, SpO2 97 %.  General: No acute distress Mood and affect are appropriate Heart: Regular rate and rhythm no rubs murmurs or extra sounds Lungs: Clear to auscultation, breathing unlabored, no rales or wheezes Abdomen: Positive bowel sounds, soft nontender to palpation, nondistended Extremities: No clubbing, cyanosis, or edema Skin: No evidence of breakdown, no evidence of rash Neurologic: Cranial nerves II through XII intact, motor strength is 5/5 in left and 0/5 right  deltoid, bicep,2- tricep,0 grip,0 hip flexor,2- knee extensors, 0ankle dorsiflexor and plantar flexor  Sensory exam normal sensation to light touch and proprioception in bilateral upper and lower extremities Cerebellar exam normal finger to nose to finger as well as heel to shin in Left  upper and lower extremities unable to perform on RIght due to weakness Musculoskeletal: Full range of motion in all 4 extremities. No joint swelling   Assessment/Plan: 1. 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  Physiatrist is providing close team supervision and 24 hour management of active medical problems listed below.  Physiatrist and rehab team continue to assess barriers to discharge/monitor patient progress toward functional and medical goals  Care Tool:  Bathing    Body parts bathed by patient: Right arm, Chest, Abdomen, Front perineal area, Buttocks, Right upper leg, Left upper leg, Left lower leg, Face   Body parts bathed by helper: Right lower leg, Left arm     Bathing assist Assist Level: Minimal Assistance - Patient > 75%     Upper Body Dressing/Undressing Upper body dressing   What is the patient wearing?: Pull over shirt    Upper body assist Assist Level: Moderate Assistance - Patient 50 - 74%    Lower Body Dressing/Undressing Lower body dressing      What is the patient wearing?: Incontinence brief     Lower body assist Assist for lower body dressing: Moderate Assistance - Patient 50 - 74%     Toileting Toileting    Toileting assist Assist for toileting: Moderate Assistance - Patient 50 - 74%     Transfers Chair/bed transfer  Transfers assist  Chair/bed transfer activity did not occur: Safety/medical concerns  Chair/bed transfer assist level: Moderate Assistance - Patient 50 - 74%     Locomotion Ambulation   Ambulation assist      Assist level: Maximal Assistance - Patient 25 - 49%  Assistive device: No Device Max distance: 20 ft   Walk 10 feet activity   Assist     Assist level: Maximal Assistance - Patient 25 - 49% Assistive device: No Device   Walk 50 feet activity   Assist Walk 50 feet with 2 turns activity did not occur: Safety/medical concerns         Walk 150 feet activity   Assist Walk 150 feet activity did not occur: Safety/medical concerns         Walk 10 feet on uneven surface  activity   Assist Walk 10 feet on  uneven surfaces activity did not occur: Safety/medical concerns         Wheelchair     Assist Will patient use wheelchair at discharge?: Yes(tbd) Type of Wheelchair: Manual    Wheelchair assist level: Contact Guard/Touching assist Max wheelchair distance: 150 ft    Wheelchair 50 feet with 2 turns activity    Assist        Assist Level: Contact Guard/Touching assist   Wheelchair 150 feet activity     Assist     Assist Level: Contact Guard/Touching assist    Medical Problem List and Plan: 1.Right hemiparesis and functional deficitssecondary to left subcortical lacunar infarct.  -CIR PT, OT  Team conference today please see physician documentation under team conference tab, met with team face-to-face to discuss problems,progress, and goals. Formulized individual treatment plan based on medical history, underlying problem and comorbidities. 2. Antithrombotics: -DVT/anticoagulation:Pharmaceutical:Lovenox -antiplatelet therapy: ASA/Plavix x3 weeks followed by ASA alone 3. Pain Management:N/A 4. Mood:LCSW to follow for evaluation and support -antipsychotic agents: N/A 5. Neuropsych: This patientiscapable of making decisions on hisown behalf.may need neuropsych for adjustment  6. Skin/Wound Care:Routine pressure re ief measures. Maintain adequate nutrition and hydration status 7. Fluids/Electrolytes/Nutrition:Monitor I/O.  Lytes are normal 8. HTN: Monitor BP bid. Continue Norvasc,  Vitals:   03/05/19 1947 03/06/19 0430  BP: (!) 171/93 (!) 148/88  Pulse: 74 79  Resp: 16 16  Temp: 98 F (36.7 C) 98.4 F (36.9 C)  SpO2: 99% 97%  some lability noted cont permissive HTN goal <126mHg sys 9. Dyslipidemia: On Lipitor.  10. Acute angioedema (face/lips): Resolved with d/c ACE and IV solumedrol--2 more dose to complete per recs. Will resume regular texturefoods. 11.Leucocytosis: Likely due to steroids. Afebrile  will recheck later in week   12,  Constipation will adjust laxatives, we discussed need to call staff at the first sensation of needing to defecate , may need toileting program   LOS: 2 days A FACE TO FACE EVALUATION WAS PERFORMED  ACharlett Blake7/29/2020, 8:46 AM

## 2019-03-06 NOTE — Progress Notes (Signed)
Speech Language Pathology Daily Session Note  Patient Details  Name: Casey Petty MRN: [id number] Date of Birth: 09/21/1960  Today's Date: 03/06/2019 SLP Individual Time: 1105-1200 SLP Individual Time Calculation (min): 55 min  Short Term Goals: Week 1: SLP Short Term Goal 1 (Week 1): Patient will consume current diet with minimal overt s/s of aspiration with supervision level verbal cues for use of swallowing compensatory strategies. SLP Short Term Goal 2 (Week 1): Patient will utilize speech intelligibility strategies at the phrase level with Mod A verbal cues to achieve ~75% intelligibility. SLP Short Term Goal 3 (Week 1): Patient will demonstrate sustained attention to functional tasks for 30 minutes with Min A verbal cues. SLP Short Term Goal 4 (Week 1): Patient will recall new, daily information with supervision level verbal and visual cues. SLP Short Term Goal 5 (Week 1): Patient will self-monitor and correct errors during functional tasks with Min A verbal cues. SLP Short Term Goal 6 (Week 1): Patient will demonstrate functional problem solving with basic and familiar tasks with Min A verbal cues.  Skilled Therapeutic Interventions: Pt was seen for skilled ST intervention targeting goals for improved speech intelligibility and cognition. Pt was initially resistant to participation, stating he is "not worried about all that" (deficit areas identified during SLP evaluation). Pt stated his arm and leg are his only deficits, and denies difficulty with recall. He was able to remember only 3/5 unrelated words after 5 minute delay. Multiple choice allowed pt to recognize the other 2 words. SLP facilitated session by providing functional mental math problems. Max verbal and visual cues were required to arrive at the correct answer, whether figuring correct change or average MPH. When asked, pt indicated that he would have been able to complete this type of task prior to his stroke. Pt speech was  largely unintelligible due to rapid speech rate, impulsivity, and imprecise consonants.  However, upon request for repetition, pt was able to produce fully intelligible sentences. Strategies for improving intelligibility were reviewed with pt (slow rate, overarticulation, pausing), and he was encouraged to utilize them for improved clarity of speech. Pt was dismissive of these suggestions, reporting he has "always talked like this". Pt was left in chair with alarm on, all needs within reach. Continue ST per current plan of care.   Pain Pain Assessment Pain Scale: 0-10 Pain Score: 0-No pain  Therapy/Group: Individual Therapy   Celia B. Quentin Ore, [employer], [place] Speech Language Pathologist  Shonna Chock 03/06/2019, 1:10 PM

## 2019-03-07 ENCOUNTER — Inpatient Hospital Stay (HOSPITAL_COMMUNITY): Payer: Medicaid Other | Admitting: Physical Therapy

## 2019-03-07 ENCOUNTER — Inpatient Hospital Stay (HOSPITAL_COMMUNITY): Payer: Medicaid Other | Admitting: Occupational Therapy

## 2019-03-07 ENCOUNTER — Inpatient Hospital Stay (HOSPITAL_COMMUNITY): Payer: Medicaid Other | Admitting: Speech Pathology

## 2019-03-07 NOTE — Progress Notes (Signed)
Physical Therapy Session Note  Patient Details  Name: Casey Petty MRN: [id number] Date of Birth: April 09, 1961  Today's Date: 03/07/2019 PT Individual Time: 1137-1205 and 2458-0998 PT Individual Time Calculation (min): 28 min and 59 min   Short Term Goals: Week 1:  PT Short Term Goal 1 (Week 1): Pt will perform bed<>chair transfer with min assist PT Short Term Goal 2 (Week 1): Pt will ambulated x 100 ft with LRAD and min assist PT Short Term Goal 3 (Week 1): Pt will participate in berg balance test for assessment of balance deficits  Skilled Therapeutic Interventions/Progress Updates:    Session 1: Pt received sitting in w/c and agreeable to therapy session. Performed L hemi-technique w/c propulsion using LE>UE ~179ft x2 to/from therapy gym with supervision and cuing for R attention. Squat pivot transfers w/c<>EOM with min assist for balance and education on proper w/c set-up prior to transfer. Sit<>stands to/from EOM with min assist for balance and education on set-up and R UE placement on/off RW orthotic. Pre-gait training using RW focusing on R LE NMR during stance phase with quad activation while stepping forward/backwards with L LE - min assist for balance and cuing for sequencing. Pre-gait training using RW focusing on R LE NMR during swing phase stepping forward/backwards with pt noted to have increased hip adduction - placed external target for improved R LE abduction placement -  progressed to tapping R LE up/down on 2" step with external target and pt noted to have significant impairments in hip flexor activation/strength to raise LE onto step. Performed seated R LE foot taps on 2" step with mod assist for balance and increased hip flexion. Performed w/c propulsion back to room as described above and pt left sitting in w/c with needs in reach, R hand splint on, seat belt alarm on, and telesitter in place.   Session 2: Pt received sitting in w/c and agreeable to therapy session. Performed L  hemi-technique w/c propulsion, using LE primarily, ~16ft x2 to/from therapy gym with supervision for safety and cuing for R attention. Squat pivot w/c>EOM with min assist/CGA for steadying to the L. Ambulated 58ft, 31ft, 91ft (seated break between each) using RW with mod assist for balance and mod/max cuing for R LE placement due to adduction and toe drag noted during swing phase - for 3rd walk therapist donned ACE wrap to assist with R LE ankle DF during swing phase with pt noted to have improved foot clearance but continues to demonstrate decreased/impaired hip/knee flexion during swing. Repeated sit<>stand EOM, no UE support, focusing on R LE NMR with 2" step under L LE to promote increased R weight shift and R LE muscle activation - mirror feedback throughout and manual facilitation for proper technique - noted to have hip adduction during eccentric lowering with cuing for improvement. Sit>supine with min assist for R LE management. Performed R LE NMR via PNF D1 pattern focusing on increased hip/knee flexion and ankle DF with manual facilitation to perform movement as pt has increased extensor tone making it challenging to perform hip and knee flexion simultaneously. Performed supine R LE straight leg hip abduction/adduction with focus on abductor activation x15 reps with multimodal cuing for proper technique and manual facilitation for increased ROM. Supine<>sidelying<>prone with min assist for R hemibody management. Sidelying R LE NMR focusing on reversing hip/knee flexion/extension with emphasis on flexion with pt demonstrating minimal hamstring activation - manual facilitation for proper form. Prone hamstring curls with multimodal cuing for muscle activation and manual facilitation for increased  ROM. Standing with B UE support on RW performed repeated R LE tap up/down on 2" step with min/mod assist for balance and max manual facilitation for increased R LE hip/knee flexion for improved motor  control/coordination to place foot on step. Squat pivot EOM>w/c with min assist/CGA for balance. Performed w/c propulsion back to room as described above. Therapist donned R hand splint and w/c tray table and pt left sitting in w/c with needs in reach, seat belt alarm on, and telesitter in place.   Therapy Documentation Precautions:  Precautions Precautions: Fall Precaution Comments: right UE and LE hemiparesis Restrictions Weight Bearing Restrictions: No  Pain: Session 1: No reports of pain during session.  Session 2: Denies pain during session.   Therapy/Group: Individual Therapy  Tawana Scale, PT, DPT 03/07/2019, 7:58 AM

## 2019-03-07 NOTE — Progress Notes (Signed)
[place] PHYSICAL MEDICINE & REHABILITATION PROGRESS NOTE   Subjective/Complaints:   Pt still needs telesitter for impulsivity Participates well with OT    Review of systems is limited by his communication, denies breathing problems pains bowel or bladder issues except for constipation  Objective:   No results found. Recent Labs    03/05/19 0702  WBC 20.6*  HGB 15.3  HCT 46.3  PLT 222   Recent Labs    03/05/19 0702  NA 138  K 4.3  CL 100  CO2 28  GLUCOSE 151*  BUN 19  CREATININE 1.18  CALCIUM 9.6    Intake/Output Summary (Last 24 hours) at 03/07/2019 0821 Last data filed at 03/07/2019 0558 Gross per 24 hour  Intake 480 ml  Output 950 ml  Net -470 ml     Physical Exam: Vital Signs Blood pressure (!) 144/93, pulse 74, temperature 98.4 F (36.9 C), resp. rate 17, height 5\' 7"  (1.702 m), weight 72.3 kg, SpO2 100 %.  General: No acute distress Mood and affect are appropriate Heart: Regular rate and rhythm no rubs murmurs or extra sounds Lungs: Clear to auscultation, breathing unlabored, no rales or wheezes Abdomen: Positive bowel sounds, soft nontender to palpation, nondistended Extremities: No clubbing, cyanosis, or edema Skin: No evidence of breakdown, no evidence of rash Neurologic: Cranial nerves II through XII intact, motor strength is 5/5 in left and 0/5 right  deltoid, bicep,2- tricep,0 grip,0 hip flexor,2- knee extensors, 0ankle dorsiflexor and plantar flexor  Sensory exam normal sensation to light touch and proprioception in bilateral upper and lower extremities Cerebellar exam normal finger to nose to finger as well as heel to shin in Left  upper and lower extremities unable to perform on RIght due to weakness Musculoskeletal: Full range of motion in all 4 extremities. No joint swelling   Assessment/Plan: 1. 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  Physiatrist is providing close team supervision and 24 hour management of active medical problems listed below.  Physiatrist and rehab team continue to assess barriers to discharge/monitor patient progress toward functional and medical goals  Care Tool:  Bathing    Body parts bathed by patient: Right arm, Chest, Abdomen, Front perineal area, Right upper leg, Left upper leg, Left lower leg, Face   Body parts bathed by helper: Left arm, Right lower leg, Buttocks     Bathing assist Assist Level: Moderate Assistance - Patient 50 - 74%     Upper Body Dressing/Undressing Upper body dressing   What is the patient wearing?: Pull over shirt    Upper body assist Assist Level: Minimal Assistance - Patient > 75%    Lower Body Dressing/Undressing Lower body dressing      What is the patient wearing?: Pants, Incontinence brief     Lower body assist Assist for lower body dressing: Moderate Assistance - Patient 50 - 74%     Toileting Toileting    Toileting assist Assist for toileting: Moderate Assistance - Patient 50 - 74%     Transfers Chair/bed transfer  Transfers assist  Chair/bed transfer activity did not occur: Safety/medical concerns  Chair/bed transfer assist level: Minimal Assistance - Patient > 75%     Locomotion Ambulation   Ambulation assist      Assist level: Maximal Assistance - Patient 25 - 49% Assistive device: No Device Max distance: 20 ft   Walk 10 feet activity  Assist     Assist level: Maximal Assistance - Patient 25 - 49% Assistive device: No Device   Walk 50 feet activity   Assist Walk 50 feet with 2 turns activity did not occur: Safety/medical concerns         Walk 150 feet activity   Assist Walk 150 feet activity did not occur: Safety/medical concerns         Walk 10 feet on uneven surface  activity   Assist Walk 10 feet on uneven surfaces activity did not occur: Safety/medical concerns          Wheelchair     Assist Will patient use wheelchair at discharge?: Yes(tbd) Type of Wheelchair: Manual    Wheelchair assist level: Contact Guard/Touching assist Max wheelchair distance: 150 ft    Wheelchair 50 feet with 2 turns activity    Assist        Assist Level: Contact Guard/Touching assist   Wheelchair 150 feet activity     Assist     Assist Level: Contact Guard/Touching assist    Medical Problem List and Plan: 1.Right hemiparesis and functional deficitssecondary to left subcortical lacunar infarct.  -CIR PT, OT - discussed impulsivity   2. Antithrombotics: -DVT/anticoagulation:Pharmaceutical:Lovenox -antiplatelet therapy: ASA/Plavix x3 weeks followed by ASA alone 3. Pain Management:N/A 4. Mood:LCSW to follow for evaluation and support -antipsychotic agents: N/A 5. Neuropsych: This patientiscapable of making decisions on hisown behalf.may need neuropsych for adjustment  6. Skin/Wound Care:Routine pressure re ief measures. Maintain adequate nutrition and hydration status 7. Fluids/Electrolytes/Nutrition:Monitor I/O.  Lytes are normal 8. HTN: Monitor BP bid. Continue Norvasc,  Vitals:   03/06/19 1937 03/07/19 0531  BP: 139/80 (!) 144/93  Pulse: 88 74  Resp: 19 17  Temp: 98.4 F (36.9 C) 98.4 F (36.9 C)  SpO2: 98% 100%  in desired range 7/30  9. Dyslipidemia: On Lipitor.  10. Acute angioedema (face/lips): Resolved with d/c ACE and IV solumedrol--2 more dose to complete per recs. Will resume regular texturefoods. 11.Leucocytosis: Likely due to steroids. Afebrile will recheck later in week   12,  Constipation will adjust laxatives, we discussed need to call staff at the first sensation of needing to defecate , may need toileting program   LOS: 3 days A FACE TO [place] E  03/07/2019, 8:21 AM

## 2019-03-07 NOTE — IPOC Note (Signed)
Overall Plan of Care [facility]) Patient Details Name: Casey Petty MRN: [id number] DOB: 08-08-1961  Admitting Diagnosis: <principal problem not specified>  Hospital Problems: Active Problems:   Basal ganglia stroke ([place])     Functional Problem List: Nursing Behavior, Bladder, Endurance, Motor, Safety  PT Balance, Behavior, Endurance, Motor, Perception, Safety, Sensory  OT Balance, Cognition, Safety, Perception, Pain, Sensory, Motor  SLP Cognition, Nutrition  TR         Basic ADL's: OT Toileting, Dressing, Bathing, Grooming, Eating     Advanced  ADL's: OT Simple Meal Preparation     Transfers: PT Bed Mobility, Bed to Chair, Car, Furniture, Floor  OT Toilet, Metallurgist: PT Ambulation, Emergency planning/management officer, Stairs     Additional Impairments: OT Fuctional Use of Upper Extremity  SLP Swallowing, Communication, Social Cognition expression Problem Solving, Memory, Attention, Awareness  TR      Anticipated Outcomes Item Anticipated Outcome  Self Feeding modified independent  Swallowing  Mod I   Basic self-care  supervision  Toileting  supervision   Bathroom Transfers supervision  Bowel/Bladder  Patient will continent of B&B with Mod I assist  Transfers  supervision  Locomotion  CGA  Communication  Supervision  Cognition  Supervision  Pain  Pain < 3 on a scale 0-10.  Safety/Judgment  Remain free of injury; Fall Prevention   Therapy Plan: PT Intensity: Minimum of 1-2 x/day ,45 to 90 minutes PT Frequency: 5 out of 7 days PT Duration Estimated Length of Stay: 14-16 days OT Intensity: Minimum of 1-2 x/day, 45 to 90 minutes OT Frequency: 5 out of 7 days OT Duration/Estimated Length of Stay: 14-16 days SLP Intensity: Minumum of 1-2 x/day, 30 to 90 minutes SLP Frequency: 3 to 5 out of 7 days SLP Duration/Estimated Length of Stay: 14-16 days   Due to the current state of emergency, patients may not be receiving their 3-hours of Medicare-mandated  therapy.   Team Interventions: Nursing Interventions Bladder Management, Disease Management/Prevention, Medication Management, Psychosocial Support  PT interventions Ambulation/gait training, Training and development officer, Cognitive remediation/compensation, Community reintegration, Discharge planning, Disease management/prevention, Patient/family education, Therapeutic Exercise, Therapeutic Activities, Stair training, Neuromuscular re-education, Wheelchair propulsion/positioning, Visual/perceptual remediation/compensation, Functional mobility training, UE/LE Coordination activities, Functional electrical stimulation, DME/adaptive equipment instruction, Psychosocial support, UE/LE Strength taining/ROM, Splinting/orthotics  OT Interventions Balance/vestibular training, Discharge planning, Functional electrical stimulation, Self Care/advanced ADL retraining, Therapeutic Activities, UE/LE Coordination activities, Pain management, Cognitive remediation/compensation, Functional mobility training, Patient/family education, Therapeutic Exercise, Visual/perceptual remediation/compensation, Community reintegration, Engineer, drilling, Neuromuscular re-education, Psychosocial support, Splinting/orthotics, UE/LE Strength taining/ROM, Wheelchair propulsion/positioning  SLP Interventions Cognitive remediation/compensation, Dysphagia/aspiration precaution training, Speech/Language facilitation, English as a second language teacher, Environmental controls, Therapeutic Activities, Patient/family education, Functional tasks, Internal/external aids  TR Interventions    SW/CM Interventions Discharge Planning, Psychosocial Support, Patient/Family Education   Barriers to Discharge MD  Medical stability  Nursing      PT Inaccessible home environment 10 STE  OT Inaccessible home environment Has 10 steps up to apartment  SLP      SW       Team Discharge Planning: Destination: PT-Home ,OT- Home , SLP-Home Projected  Follow-up: PT-Home health PT, OT-  Home health OT, 24 hour supervision/assistance, SLP-Home Health SLP, 24 hour supervision/assistance Projected Equipment Needs: PT-To be determined, OT- To be determined, SLP-None recommended by SLP Equipment Details: PT- , OT-  Patient/family involved in discharge planning: PT- Patient,  OT-Patient, SLP-Patient  MD ELOS: 14-17d Medical Rehab Prognosis:  Good Assessment:  58 year old male with history of HTN--no  meds X 3 yearswho was admitted on 03/01/2019 with 2-day history of right-sided weakness with RLE pain and difficulty walkingand slurred speech.Blood pressure elevated at admission andUDS negative. MRI lumbar spine showed moderate bilateral L5 foraminal stenosis and mild to moderate facet arthropathy more pronounced at L4-L5. 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 2 D echo done revealing mild concentric LVH with EF >65% and no shunt. Carotid dopplers were negative for significant ICA stenosis. Dr. [name] felt that stroke was due to small vessel disease and recommended ASA/Plavix for 3 weeks followed by ASA alone. Blood pressures continue to be labile and medications being titrated. He developed angioedema with addition of Lisinopril therefore this was discontinued and he received IV solumedrol with improvement. Patient with anarthria/dysarthria with mild receptive deficits and left sided weakness affecting mobility and ADLs. CIR recommended due to functional decline.   Now requiring 24/7 Rehab RN,MD, as well as CIR level PT, OT and SLP.  Treatment team will focus on ADLs and mobility with goals set at Supervision See Team Conference Notes for weekly updates to the plan of care

## 2019-03-07 NOTE — Progress Notes (Signed)
Speech Language Pathology Daily Session Note  Patient Details  Name: Casey Petty MRN: [id number] Date of Birth: Dec 18, 1960  Today's Date: 03/07/2019 SLP Individual Time: 1000-1030 SLP Individual Time Calculation (min): 30 min  Short Term Goals: Week 1: SLP Short Term Goal 1 (Week 1): Patient will consume current diet with minimal overt s/s of aspiration with supervision level verbal cues for use of swallowing compensatory strategies. SLP Short Term Goal 2 (Week 1): Patient will utilize speech intelligibility strategies at the phrase level with Mod A verbal cues to achieve ~75% intelligibility. SLP Short Term Goal 3 (Week 1): Patient will demonstrate sustained attention to functional tasks for 30 minutes with Min A verbal cues. SLP Short Term Goal 4 (Week 1): Patient will recall new, daily information with supervision level verbal and visual cues. SLP Short Term Goal 5 (Week 1): Patient will self-monitor and correct errors during functional tasks with Min A verbal cues. SLP Short Term Goal 6 (Week 1): Patient will demonstrate functional problem solving with basic and familiar tasks with Min A verbal cues.  Skilled Therapeutic Interventions:  Skilled treatment session focused on speech intelligibility. SLP facilitated session by providing Mod A cues to recall speech intelligibility strategies. While pt agreed with strategies, he was no able to return demonstrate any strategy when producing speech at the word or phrase level. Pt's speech intelligibility is severely compromised and was ~ 25% at the phrase level c/b imprecise articulation and hypernasality with some nasal emissions during speech. SLP further assessed for any word finding deficits. Pt presented with functional word finding abilities with generative, responsive, confrontational and divergent naming tasks. Pt left upright in wheelchair, lap belt alarm on and all needs within reach, tele-sitter present. Continue per current plan of care.       Pain    Therapy/Group: Individual Therapy  Tawny Raspberry 03/07/2019, 2:24 PM

## 2019-03-07 NOTE — Progress Notes (Signed)
Occupational Therapy Session Note  Patient Details  Name: Casey Petty MRN: [id number] Date of Birth: [birth year]  Today's Date: 03/07/2019 OT Individual Time: 9150-5697 OT Individual Time Calculation (min): 70 min    Short Term Goals: Week 1:  OT Short Term Goal 1 (Week 1): Pt will complete UB bathing with supervision following hemi technique. OT Short Term Goal 2 (Week 1): Pt will perform stand pivot transfers to the walk-in shower and toilet with min guard and use of the RW for support. OT Short Term Goal 3 (Week 1): Pt will use the RUE as a stabilizer with min assist during selfcare tasks. OT Short Term Goal 4 (Week 1): Pt will complete LB dressing with min guard assist following hemi dressing techniques. Week 2:     Skilled Therapeutic Interventions/Progress Updates:    1:1 SElf care retraining at sink level - pt's choice. Pt participated in safe transfer training with mod cues for optimal body positioning prior to moving. Continued to focus on hemi dressing techniques with mod cues for sequencing. Pt requires extra time for tasks but continues to pursue task. Pt performs sit to stand with contact guard with tactile cues for terminal knee/ hip extension on the right (during clothing management and bathing peri area in standing). Pt with inititation with use of left UE with isolated right UE tasks ie donning deordant and washing left UE with right).   Pt able to propel self to gym with distant supervision.   Transfer to mat and continued to focus on NMR of right UE/LE. Focus on normal patterns of movement in right UE with objects to help visualize movements and during functional reach. Pt able to achieve a weak grasp today!!! Difficulty at times to reproduce. Performed PNF in supine with focus on distal ROM. Also perform bridging with  Focus on core control and control of right hip control.   Left sitting up in w/c at end of session.   Therapy Documentation Precautions:   Precautions Precautions: Fall Precaution Comments: right UE and LE hemiparesis Restrictions Weight Bearing Restrictions: No Pain:  no c/o pain in session    Therapy/Group: Individual Therapy  Willeen Cass [facility] 03/07/2019, 9:23 AM

## 2019-03-07 NOTE — Plan of Care (Signed)
  Problem: Consults Goal: RH STROKE PATIENT EDUCATION Description: See Patient Education module for education specifics  Outcome: Progressing   Problem: RH BOWEL ELIMINATION Goal: RH STG MANAGE BOWEL WITH ASSISTANCE Description: STG Manage Bowel with Mod I Assistance. Outcome: Progressing   Problem: RH BLADDER ELIMINATION Goal: RH STG MANAGE BLADDER WITH ASSISTANCE Description: STG Manage Bladder Independently  Outcome: Progressing   Problem: RH SAFETY Goal: RH STG ADHERE TO SAFETY PRECAUTIONS W/ASSISTANCE/DEVICE Description: STG Adhere to Safety Precautions With Cues and Supervision  Outcome: Progressing   Problem: RH KNOWLEDGE DEFICIT Goal: RH STG INCREASE KNOWLEDGE OF HYPERTENSION Description: Patient will be able to manage HTN independently using educational handouts.   Outcome: Progressing Goal: RH STG INCREASE KNOWLEDGE OF STROKE PROPHYLAXIS Description: Patient will be able to state management of secondary stroke prevention independently.  Outcome: Progressing   Problem: RH KNOWLEDGE DEFICIT Goal: RH STG INCREASE KNOWLEGDE OF HYPERLIPIDEMIA Description: Patient will be able to state management of HLD Independently  Outcome: Progressing

## 2019-03-08 ENCOUNTER — Inpatient Hospital Stay (HOSPITAL_COMMUNITY): Payer: Medicaid Other | Admitting: Speech Pathology

## 2019-03-08 ENCOUNTER — Inpatient Hospital Stay (HOSPITAL_COMMUNITY): Payer: Medicaid Other | Admitting: Occupational Therapy

## 2019-03-08 ENCOUNTER — Inpatient Hospital Stay (HOSPITAL_COMMUNITY): Payer: Medicaid Other

## 2019-03-08 ENCOUNTER — Inpatient Hospital Stay (HOSPITAL_COMMUNITY): Payer: Medicaid Other | Admitting: Physical Therapy

## 2019-03-08 NOTE — Progress Notes (Signed)
Occupational Therapy Session Note  Patient Details  Name: Casey Petty MRN: [id number] Date of Birth: [birth year]  Today's Date: 03/08/2019 OT Individual Time: 1345-1425 OT Individual Time Calculation (min): 40 min    Short Term Goals: Week 1:  OT Short Term Goal 1 (Week 1): Pt will complete UB bathing with supervision following hemi technique. OT Short Term Goal 2 (Week 1): Pt will perform stand pivot transfers to the walk-in shower and toilet with min guard and use of the RW for support. OT Short Term Goal 3 (Week 1): Pt will use the RUE as a stabilizer with min assist during selfcare tasks. OT Short Term Goal 4 (Week 1): Pt will complete LB dressing with min guard assist following hemi dressing techniques.  Skilled Therapeutic Interventions/Progress Updates:    1:1 NMES applied to R wrist/hand flexors and extensors to facilitate functional grasp/release  Ratio 1:3 Rate 35 pps Waveform- Asymmetric Ramp 1.0 Pulse 300 Intensity-  19/21 Duration -  10 mins   No adverse reactions after treatment and is skin intact.   Increased flexion/extension but unable to replicate when stimulation removed  Pt also engaged in [place] with focus on functional reach, grasp, release, and scapula protraction/retraction.  Resting hand splint donned at end of session.  Pt remained in w/c with belt alarm activated and all needs within reach.   Therapy Documentation Precautions:  Precautions Precautions: Fall Precaution Comments: right UE and LE hemiparesis Restrictions Weight Bearing Restrictions: No   Pain: Pain Assessment Pain Scale: 0-10 Pain Score: 0-No pain   Other Treatments: Treatments Neuromuscular Facilitation: Right;Upper Extremity;Activity to increase coordination;Activity to increase motor control;Activity to increase sustained activation   Therapy/Group: Individual Therapy  Leroy Libman 03/08/2019, 2:40 PM

## 2019-03-08 NOTE — Progress Notes (Signed)
[place] PHYSICAL MEDICINE & REHABILITATION PROGRESS NOTE   Subjective/Complaints:   No issues overnite , per nsg pt urinated in cup and threw it.  We discussed that pt needs to use urinal, and not intentionally spill  Review of systems is limited by his communication, denies breathing problems pains bowel or bladder issues except for constipation  Objective:   No results found. No results for input(s): WBC, HGB, HCT, PLT in the last 72 hours. No results for input(s): NA, K, CL, CO2, GLUCOSE, BUN, CREATININE, CALCIUM in the last 72 hours.  Intake/Output Summary (Last 24 hours) at 03/08/2019 0818 Last data filed at 03/08/2019 0500 Gross per 24 hour  Intake 684 ml  Output 200 ml  Net 484 ml     Physical Exam: Vital Signs Blood pressure 138/89, pulse 75, temperature 98.7 F (37.1 C), temperature source Oral, resp. rate 17, height 5\' 7"  (1.702 m), weight 72.3 kg, SpO2 100 %.  General: No acute distress Mood and affect are appropriate Heart: Regular rate and rhythm no rubs murmurs or extra sounds Lungs: Clear to auscultation, breathing unlabored, no rales or wheezes Abdomen: Positive bowel sounds, soft nontender to palpation, nondistended Extremities: No clubbing, cyanosis, or edema Skin: No evidence of breakdown, no evidence of rash Neurologic: Cranial nerves II through XII intact, motor strength is 5/5 in left and 0/5 right  deltoid, bicep,2- tricep,0 grip,0 hip flexor,2- knee extensors, 0ankle dorsiflexor and plantar flexor  Sensory exam normal sensation to light touch and proprioception in bilateral upper and lower extremities Cerebellar exam normal finger to nose to finger as well as heel to shin in Left  upper and lower extremities unable to perform on RIght due to weakness Musculoskeletal: Full range of motion in all 4 extremities. No joint swelling   Assessment/Plan: 1. Functional deficits secondary to Left int capsule infarct  which require 3+ hours per day of  interdisciplinary therapy in a comprehensive inpatient rehab setting.  Physiatrist is providing close team supervision and 24 hour management of active medical problems listed below.  Physiatrist and rehab team continue to assess barriers to discharge/monitor patient progress toward functional and medical goals  Care Tool:  Bathing    Body parts bathed by patient: Right arm, Chest, Abdomen, Front perineal area, Right upper leg, Left upper leg, Left lower leg, Face   Body parts bathed by helper: Left arm, Right lower leg, Buttocks     Bathing assist Assist Level: Moderate Assistance - Patient 50 - 74%     Upper Body Dressing/Undressing Upper body dressing   What is the patient wearing?: Pull over shirt    Upper body assist Assist Level: Minimal Assistance - Patient > 75%    Lower Body Dressing/Undressing Lower body dressing      What is the patient wearing?: Pants, Incontinence brief     Lower body assist Assist for lower body dressing: Moderate Assistance - Patient 50 - 74%     Toileting Toileting    Toileting assist Assist for toileting: Moderate Assistance - Patient 50 - 74%     Transfers Chair/bed transfer  Transfers assist  Chair/bed transfer activity did not occur: Safety/medical concerns  Chair/bed transfer assist level: Minimal Assistance - Patient > 75%     Locomotion Ambulation   Ambulation assist      Assist level: Moderate Assistance - Patient 50 - 74% Assistive device: Walker-rolling Max distance: 44ft   Walk 10 feet activity   Assist     Assist level: Moderate Assistance -  Patient - 50 - 74% Assistive device: Walker-rolling   Walk 50 feet activity   Assist Walk 50 feet with 2 turns activity did not occur: Safety/medical concerns         Walk 150 feet activity   Assist Walk 150 feet activity did not occur: Safety/medical concerns         Walk 10 feet on uneven surface  activity   Assist Walk 10 feet on uneven  surfaces activity did not occur: Safety/medical concerns         Wheelchair     Assist Will patient use wheelchair at discharge?: Yes Type of Wheelchair: Manual    Wheelchair assist level: Supervision/Verbal cueing, Set up assist Max wheelchair distance: 145ft    Wheelchair 50 feet with 2 turns activity    Assist        Assist Level: Set up assist, Supervision/Verbal cueing   Wheelchair 150 feet activity     Assist     Assist Level: Set up assist, Supervision/Verbal cueing    Medical Problem List and Plan: 1.Right hemiparesis and functional deficitssecondary to left subcortical lacunar infarct.  -CIR PT, OT - discussed impulsivity   2. Antithrombotics: -DVT/anticoagulation:Pharmaceutical:Lovenox -antiplatelet therapy: ASA/Plavix x3 weeks followed by ASA alone 3. Pain Management:N/A 4. Mood:LCSW to follow for evaluation and support -antipsychotic agents: N/A 5. Neuropsych: This patientiscapable of making decisions on hisown behalf.may need neuropsych for adjustment  May need behavior plan if pt is disruptive at noc  6. Skin/Wound Care:Routine pressure re ief measures. Maintain adequate nutrition and hydration status 7. Fluids/Electrolytes/Nutrition:Monitor I/O.  Lytes are normal 8. HTN: Monitor BP bid. Continue Norvasc 10mg  daily ,Coreg 6.25mg  BID   Vitals:   03/07/19 1343 03/08/19 0502  BP: 136/77 138/89  Pulse: 78 75  Resp: 17 17  Temp: 98.6 F (37 C) 98.7 F (37.1 C)  SpO2: 100% 100%  in desired range 7/31 9. Dyslipidemia: On Lipitor.  10. Acute angioedema (face/lips): Resolved with d/c ACE and IV solumedrol--Will resume regular texturefoods. 11.Leucocytosis: Likely due to steroids. Afebrile will recheck later in week   12,  Constipation will adjust laxatives, we discussed need to call staff at the first sensation of needing to defecate , may need toileting program   LOS: 4 days A FACE TO  [place] E Kirsteins 03/08/2019, 8:18 AM

## 2019-03-08 NOTE — Progress Notes (Signed)
Physical Therapy Session Note  Patient Details  Name: Casey Petty MRN: [id number] Date of Birth: February 07, 1961  Today's Date: 03/08/2019 PT Individual Time: 1650-1736 PT Individual Time Calculation (min): 46 min   Short Term Goals: Week 1:  PT Short Term Goal 1 (Week 1): Pt will perform bed<>chair transfer with min assist PT Short Term Goal 2 (Week 1): Pt will ambulated x 100 ft with LRAD and min assist PT Short Term Goal 3 (Week 1): Pt will participate in berg balance test for assessment of balance deficits  Skilled Therapeutic Interventions/Progress Updates:    Pt received sitting in w/c and agreeable to therapy session. Performed L hemi-technique, primarily using LE, w/c propulsion ~133ft x2 to/from therapy gym. Stand/squat pivot transfers, no AD, x2 during session with min assist for balance. Stand pivot using RW with CGA/min assist for balance and max cuing for LE stepping and AD management - pt demonstrated improved balance and sequencing of transfer when using RW compared to without. Sit<>stands with CGA/min assist for balance - when using RW requires max cuing for R UE placement on/off RW orthotic and min/mod assist for balance due to R lean when strapping in hand. Ambulated 73ft x2 using RW with min/mod progressed to completely mod assist for balance towards end of walk due to fatigue - max cuing for increased hip/knee flexion to improve R LE foot clearance during swing phase (no use of AFO or ACE wrap to assist with ankle DF this session).  Performed the following tasks focusing R LE NMR to increase hip and knee flexor muscle activation: - standing, no UE support, stepping R LE up/down on 2" step but pt demonstrated over powering with quad activation - min/mod assist for balance - standing with RW to increase focus on R LE movement and decrease focus on balance performing R LE cone tap with max manual facilitation for RLE movement and min/CGA for standing balance - standing with RW R LE  hamstring curls with max assist/manual facilitation for knee flexion movement with external target for increased motor activation - seated in w/c RLE w/c propulsion focusing on hamstring activation with max assist/manual facilitation for LE movement Performed w/c propulsion back to room and pt left sitting in w/c with needs in reach, seat belt alarm on, and meal tray set-up.  Therapy Documentation Precautions:  Precautions Precautions: Fall Precaution Comments: right UE and LE hemiparesis Restrictions Weight Bearing Restrictions: No  Pain: No reports of pain during session.   Therapy/Group: Individual Therapy  Tawana Scale, PT, DPT 03/08/2019, 3:33 PM

## 2019-03-08 NOTE — Progress Notes (Signed)
Speech Language Pathology Daily Session Note  Patient Details  Name: Roverto Bodmer MRN: [id number] Date of Birth: [birth year]  Today's Date: 03/08/2019 SLP Individual Time: 7493-5521 SLP Individual Time Calculation (min): 45 min  Short Term Goals: Week 1: SLP Short Term Goal 1 (Week 1): Patient will consume current diet with minimal overt s/s of aspiration with supervision level verbal cues for use of swallowing compensatory strategies. SLP Short Term Goal 2 (Week 1): Patient will utilize speech intelligibility strategies at the phrase level with Mod A verbal cues to achieve ~75% intelligibility. SLP Short Term Goal 3 (Week 1): Patient will demonstrate sustained attention to functional tasks for 30 minutes with Min A verbal cues. SLP Short Term Goal 4 (Week 1): Patient will recall new, daily information with supervision level verbal and visual cues. SLP Short Term Goal 5 (Week 1): Patient will self-monitor and correct errors during functional tasks with Min A verbal cues. SLP Short Term Goal 6 (Week 1): Patient will demonstrate functional problem solving with basic and familiar tasks with Min A verbal cues.  Skilled Therapeutic Interventions:  Skilled treatment focused on communication goals. SLP heard pt arguing with his significant other Katharine Look) via phone. SLP spoke with Katharine Look and she confirms to this Probation officer that pt's speech is at baseline and was impaired (unknown reason) when she met him (> 4 years prior). Pt further states that he was frequently told to "stop mumbling" when he was growing up. Pt is able to recall which bills that Katharine Look needs to pay and provide instructions to her on how to pay them. She understood all information. As such, would recommend targeting dysphagia and cognition during skilled ST sessions.      Pain Pain Assessment Pain Scale: 0-10 Pain Score: 0-No pain  Therapy/Group: Individual Therapy  Timathy Newberry 03/08/2019, 12:59 PM

## 2019-03-08 NOTE — Progress Notes (Signed)
Occupational Therapy Session Note  Patient Details  Name: Casey Petty MRN: [id number] Date of Birth: 1961/02/28  Today's Date: 03/08/2019 OT Individual Time: 1015-1100 OT Individual Time Calculation (min): 45 min    Short Term Goals: Week 1:  OT Short Term Goal 1 (Week 1): Pt will complete UB bathing with supervision following hemi technique. OT Short Term Goal 2 (Week 1): Pt will perform stand pivot transfers to the walk-in shower and toilet with min guard and use of the RW for support. OT Short Term Goal 3 (Week 1): Pt will use the RUE as a stabilizer with min assist during selfcare tasks. OT Short Term Goal 4 (Week 1): Pt will complete LB dressing with min guard assist following hemi dressing techniques.  Skilled Therapeutic Interventions/Progress Updates:    Patient seated in w/c, ready for therapy session.  Max A for donning socks and shoes, Sit pivot transfer to/from w/c and therapy may CGA, he is able to propel w/c to/from therapy gym.  Unsupported sitting on mat with CS, good seated balance, completed R UE NMRE with use of hand paddle and proximal facilitation (inhibition of flexors) - he was able to move shoulder in all directions gravity reduced planes, full elbow extension with resistance challenge.  Weight bearing and trunk mobility activities with good results.  Patient remained in the w/c at close of session with seat belt alarm set and call bell in reach.    Therapy Documentation Precautions:  Precautions Precautions: Fall Precaution Comments: right UE and LE hemiparesis Restrictions Weight Bearing Restrictions: No General:   Vital Signs:   Pain: Pain Assessment Pain Scale: 0-10 Pain Score: 0-No pain   Other Treatments:     Therapy/Group: Individual Therapy  Carlos Levering 03/08/2019, 12:25 PM

## 2019-03-09 ENCOUNTER — Inpatient Hospital Stay (HOSPITAL_COMMUNITY): Payer: Medicaid Other | Admitting: Physical Therapy

## 2019-03-09 ENCOUNTER — Inpatient Hospital Stay (HOSPITAL_COMMUNITY): Payer: Medicaid Other | Admitting: Occupational Therapy

## 2019-03-09 ENCOUNTER — Inpatient Hospital Stay (HOSPITAL_COMMUNITY): Payer: Medicaid Other | Admitting: Speech Pathology

## 2019-03-09 DIAGNOSIS — D72828 Other elevated white blood cell count: Secondary | ICD-10-CM

## 2019-03-09 DIAGNOSIS — G8191 Hemiplegia, unspecified affecting right dominant side: Secondary | ICD-10-CM

## 2019-03-09 DIAGNOSIS — R739 Hyperglycemia, unspecified: Secondary | ICD-10-CM

## 2019-03-09 DIAGNOSIS — I69351 Hemiplegia and hemiparesis following cerebral infarction affecting right dominant side: Secondary | ICD-10-CM

## 2019-03-09 DIAGNOSIS — D72829 Elevated white blood cell count, unspecified: Secondary | ICD-10-CM

## 2019-03-09 NOTE — Plan of Care (Signed)
  Problem: RH BLADDER ELIMINATION Goal: RH STG MANAGE BLADDER WITH ASSISTANCE Description: STG Manage Bladder Independently  Outcome: Progressing   Problem: RH SAFETY Goal: RH STG ADHERE TO SAFETY PRECAUTIONS W/ASSISTANCE/DEVICE Description: STG Adhere to Safety Precautions With Cues and Supervision  Outcome: Progressing   Problem: RH KNOWLEDGE DEFICIT Goal: RH STG INCREASE KNOWLEDGE OF HYPERTENSION Description: Patient will be able to manage HTN independently using educational handouts.   Outcome: Progressing Goal: RH STG INCREASE KNOWLEDGE OF STROKE PROPHYLAXIS Description: Patient will be able to state management of secondary stroke prevention independently.  Outcome: Progressing

## 2019-03-09 NOTE — Progress Notes (Signed)
Occupational Therapy Session Note  Patient Details  Name: Casey Petty MRN: [id number] Date of Birth: 03-10-1961  Today's Date: 03/09/2019 OT Individual Time: 9024-0973 OT Individual Time Calculation (min): 56 min    Short Term Goals: Week 1:  OT Short Term Goal 1 (Week 1): Pt will complete UB bathing with supervision following hemi technique. OT Short Term Goal 2 (Week 1): Pt will perform stand pivot transfers to the walk-in shower and toilet with min guard and use of the RW for support. OT Short Term Goal 3 (Week 1): Pt will use the RUE as a stabilizer with min assist during selfcare tasks. OT Short Term Goal 4 (Week 1): Pt will complete LB dressing with min guard assist following hemi dressing techniques.  Skilled Therapeutic Interventions/Progress Updates:    Treatment session with focus on RUE NMR and trunk control.  Pt received upright in w/c declining bathing/dressing this session, expressing desire to focus on arm.  Pt propelled w/c to therapy gym with hemi-technique with supervision.  Completed squat pivot transfers w/c <> therapy mat with min-mod assist.  Engaged in lateral scooting along edge of mat for trunk control and attention to Rt.  Engaged in [place] in supine with focus on shoulder flexion against gravity.  Utilized dowel rod for symmetrical movements to facilitate increased range.  Therapist provided facilitation at shoulder and stabilization at hand to maintain grasp.  Multimodal cues for technique.  Incorporated abduction/adduction in supine with arm slides along mat with tactile cues for technique.    NMES applied to Rt wrist/hand flexors and extensors to facilitate functional grasp/release  Ratio 1:3 Rate 35 pps Waveform- Asymmetric Ramp 1.0 Pulse 300 Intensity-  19/21 Duration -  8 mins   No adverse reactions after treatment and is skin intact.   Increased flexion/extension but unable to replicate when stimulation removed  Upon return to room, pt  transferred back to bed min assist and left reclined in bed with all needs in reach.  Therapy Documentation Precautions:  Precautions Precautions: Fall Precaution Comments: right UE and LE hemiparesis Restrictions Weight Bearing Restrictions: No Pain: Pain Assessment Pain Scale: 0-10 Pain Score: 0-No pain   Therapy/Group: Individual Therapy  Simonne Come 03/09/2019, 11:28 AM

## 2019-03-09 NOTE — Progress Notes (Signed)
Speech Language Pathology Daily Session Note  Patient Details  Name: Casey Petty MRN: [id number] Date of Birth: 1961-05-05  Today's Date: 03/09/2019 SLP Individual Time: 6659-9357 SLP Individual Time Calculation (min): 28 min  Short Term Goals: Week 1: SLP Short Term Goal 1 (Week 1): Patient will consume current diet with minimal overt s/s of aspiration with supervision level verbal cues for use of swallowing compensatory strategies. SLP Short Term Goal 2 (Week 1): Patient will utilize speech intelligibility strategies at the phrase level with Mod A verbal cues to achieve ~75% intelligibility. SLP Short Term Goal 3 (Week 1): Patient will demonstrate sustained attention to functional tasks for 30 minutes with Min A verbal cues. SLP Short Term Goal 4 (Week 1): Patient will recall new, daily information with supervision level verbal and visual cues. SLP Short Term Goal 5 (Week 1): Patient will self-monitor and correct errors during functional tasks with Min A verbal cues. SLP Short Term Goal 6 (Week 1): Patient will demonstrate functional problem solving with basic and familiar tasks with Min A verbal cues.  Skilled Therapeutic Interventions: Patient received skilled SLP services targeting cognitive goals. Patient participated in 10 minute attention task sorting a deck of cards by color and number requiring min verbal cues for redirection. Patient participated in an attention task identifying a word that begins with each letter of the alphabet requiring mod verbal cues. Patient participated in word list retention: category inclusion memory task with 70% accuracy requiring min verbal cues.   Pain Pain Assessment Pain Scale: 0-10 Pain Score: 0-No pain  Therapy/Group: Individual Therapy  Cristy Folks 03/09/2019, 3:00 PM

## 2019-03-09 NOTE — Progress Notes (Signed)
[facility] PHYSICAL MEDICINE & REHABILITATION PROGRESS NOTE   Subjective/Complaints: Patient seen sitting up in bed this morning.  He states he slept well overnight.  He states he wore his orthosis overnight.  Later seen working with therapies.  Review of systems: Denies CP, SOB, N/V/D  Objective:   No results found. No results for input(s): WBC, HGB, HCT, PLT in the last 72 hours. No results for input(s): NA, K, CL, CO2, GLUCOSE, BUN, CREATININE, CALCIUM in the last 72 hours.  Intake/Output Summary (Last 24 hours) at 03/09/2019 1319 Last data filed at 03/09/2019 0603 Gross per 24 hour  Intake 222 ml  Output 700 ml  Net -478 ml     Physical Exam: Vital Signs Blood pressure (!) 152/72, pulse 70, temperature 98.2 F (36.8 C), resp. rate 16, height 5\' 7"  (1.702 m), weight 72.3 kg, SpO2 98 %. Constitutional: No distress . Vital signs reviewed. HENT: Normocephalic.  Atraumatic. Eyes: EOMI. No discharge. Cardiovascular: No JVD. Respiratory: Normal effort. GI: Non-distended. Musc: No edema or tenderness in extremities. Neurologic: Alert Motor: LUE/LLE: 5/5 RUE: Shoulder abduction 3/5 Right lower extremity: Ankle dorsiflexion 0/5  Assessment/Plan: 1. 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  Physiatrist is providing close team supervision and 24 hour management of active medical problems listed below.  Physiatrist and rehab team continue to assess barriers to discharge/monitor patient progress toward functional and medical goals  Care Tool:  Bathing    Body parts bathed by patient: Right arm, Chest, Abdomen, Front perineal area, Right upper leg, Left upper leg, Left lower leg, Face   Body parts bathed by helper: Left arm, Right lower leg, Buttocks     Bathing assist Assist Level: Moderate Assistance - Patient 50 - 74%     Upper Body  Dressing/Undressing Upper body dressing   What is the patient wearing?: Pull over shirt    Upper body assist Assist Level: Minimal Assistance - Patient > 75%    Lower Body Dressing/Undressing Lower body dressing      What is the patient wearing?: Pants, Incontinence brief     Lower body assist Assist for lower body dressing: Moderate Assistance - Patient 50 - 74%     Toileting Toileting    Toileting assist Assist for toileting: Moderate Assistance - Patient 50 - 74%     Transfers Chair/bed transfer  Transfers assist  Chair/bed transfer activity did not occur: Safety/medical concerns  Chair/bed transfer assist level: Minimal Assistance - Patient > 75%     Locomotion Ambulation   Ambulation assist      Assist level: Moderate Assistance - Patient 50 - 74% Assistive device: Walker-rolling Max distance: 38ft   Walk 10 feet activity   Assist     Assist level: Moderate Assistance - Patient - 50 - 74% Assistive device: Walker-rolling   Walk 50 feet activity   Assist Walk 50 feet with 2 turns activity did not occur: Safety/medical concerns  Assist level: Moderate Assistance - Patient - 50 - 74% Assistive device: Walker-rolling    Walk 150 feet activity   Assist Walk 150 feet activity did not occur: Safety/medical concerns         Walk 10 feet on uneven surface  activity   Assist Walk 10 feet on uneven surfaces activity did not occur: Safety/medical concerns         Wheelchair     Assist Will patient use wheelchair  at discharge?: Yes Type of Wheelchair: Manual    Wheelchair assist level: Set up assist, Supervision/Verbal cueing Max wheelchair distance: 141ft    Wheelchair 50 feet with 2 turns activity    Assist        Assist Level: Set up assist, Supervision/Verbal cueing   Wheelchair 150 feet activity     Assist     Assist Level: Set up assist, Supervision/Verbal cueing    Medical Problem List and  Plan: 1.Right hemiparesis and functional deficitssecondary to left subcortical lacunar infarct.   Continue CIR 2. Antithrombotics: -DVT/anticoagulation:Pharmaceutical:Lovenox -antiplatelet therapy: ASA/Plavix x3 weeks followed by ASA alone 3. Pain Management:N/A 4. Mood:LCSW to follow for evaluation and support -antipsychotic agents: N/A 5. Neuropsych: This patientiscapable of making decisions on hisown behalf.may need neuropsych for adjustment   May need behavior plan if pt is disruptive at noc  6. Skin/Wound Care:Routine pressure re ief measures. Maintain adequate nutrition and hydration status 7. Fluids/Electrolytes/Nutrition:Monitor I/O.  Lytes are normal 8. HTN: Monitor BP bid. Continue Norvasc 10mg  daily ,Coreg 6.25mg  BID   Vitals:   03/08/19 2011 03/09/19 0603  BP: (!) 153/88 (!) 152/72  Pulse: 76 70  Resp: 16 16  Temp: 98 F (36.7 C) 98.2 F (36.8 C)  SpO2: 98% 98%   Labile on 8/1 9. Dyslipidemia: On Lipitor.  10. Acute angioedema (face/lips): Resolved with d/c ACE and IV solumedrol.  Resumed regular texturefoods without issue. 11.Leucocytosis: Likely due to steroids.   WBCs 20.6 on 7/28  Afebrile  Labs ordered for Monday 12.  Constipation will adjust laxatives, have discussed need to call staff at the first sensation of needing to defecate , may need toileting program  13.  Hyperglycemia  Likely secondary to steroids on 7/28, labs ordered for Monday  Continue to monitor  LOS: 5 days A FACE TO FACE EVALUATION WAS PERFORMED  Kaleesi Guyton Lorie Phenix 03/09/2019, 1:19 PM

## 2019-03-09 NOTE — Progress Notes (Signed)
Physical Therapy Session Note  Patient Details  Name: Casey Petty MRN: [id number] Date of Birth: 1960-12-26  Today's Date: 03/09/2019 PT Individual Time: 0802-0903 PT Individual Time Calculation (min): 61 min   Short Term Goals: Week 1:  PT Short Term Goal 1 (Week 1): Pt will perform bed<>chair transfer with min assist PT Short Term Goal 2 (Week 1): Pt will ambulated x 100 ft with LRAD and min assist PT Short Term Goal 3 (Week 1): Pt will participate in berg balance test for assessment of balance deficits  Skilled Therapeutic Interventions/Progress Updates:    Pt received supine in bed and agreeable to therapy session. Supine>sit, HOB partially elevated and heavy reliance on bedrails, with close supervision for safety. Sitting EOB donned pants and shoes max assist. Sit<>stands with CGA for steadying during session - when using RW requires max cuing for proper R LE placement prior to standing and proper R hand placement on RW orthotic. Completed donning pants in standing, no AD, with min assist for balance and mod assist for buttoning pants. Stand pivot, no AD, to w/c with mod assist for balance and mod cuing for sequencing. Performed L HB w/c propulsion ~177ft x2 to/from therapy gym with supervision and set-up assist - educated pt on how to move R leg rest. Stand pivot w/c>EOM using RW with min assist for balance and mod cuing for sequencing. Sit>supine with CGA for R LE management. Performed supine R LE NMR via PNF D1 flexion/extension with emphasis on hip/knee flexion and ankle DF activation and for motor coordination/planning to decreased quadriceps activation during hip flexion. Sidelying R LE NMR using powder board to perform R LE hip/knee flexion and knee flexion/extension reversals to improve motor control/coordination - pt continues to demonstrate impaired ability to activate hamstring muscles often recruiting quadriceps instead. Supine>sit with supervision. Standing R LE NMR via pre-gait  training using RW with min assist for balance and max assist/manual facilitation for R LE hip/knee flexion while stepping forward/backwards. Ambulated ~29ft x2 using RW with min assist for balance and max assist/manual facilitation for increased R LE hip/knee flexion during swing phase - requires mod assist for balance when turning while ambulating or turning to sit in chair with max cuing for sequencing. Performed hemitechnique w/c propulsion back to room and pt left sitting in w/c with needs in reach, seat belt alarm on, and telesitter in place.  Therapy Documentation Precautions:  Precautions Precautions: Fall Precaution Comments: right UE and LE hemiparesis Restrictions Weight Bearing Restrictions: No  Pain: Denies pain during session.   Therapy/Group: Individual Therapy  Tawana Scale, PT, DPT 03/09/2019, 7:47 AM

## 2019-03-10 ENCOUNTER — Inpatient Hospital Stay (HOSPITAL_COMMUNITY): Payer: Medicaid Other | Admitting: Physical Therapy

## 2019-03-10 DIAGNOSIS — D72829 Elevated white blood cell count, unspecified: Secondary | ICD-10-CM

## 2019-03-10 MED ORDER — HYDRALAZINE HCL 10 MG PO TABS
10.0000 mg | ORAL_TABLET | Freq: Three times a day (TID) | ORAL | Status: DC
  Administered 2019-03-10 – 2019-03-11 (×3): 10 mg via ORAL
  Filled 2019-03-10 (×3): qty 1

## 2019-03-10 NOTE — Progress Notes (Signed)
Patient refused bot the hand splint and the boot at night. Patient educated.

## 2019-03-10 NOTE — Progress Notes (Signed)
Physical Therapy Session Note  Patient Details  Name: Casey Petty MRN: [id number] Date of Birth: Dec 18, 1960  Today's Date: 03/10/2019 PT Individual Time: 0759-0904 PT Individual Time Calculation (min): 65 min   Short Term Goals: Week 1:  PT Short Term Goal 1 (Week 1): Pt will perform bed<>chair transfer with min assist PT Short Term Goal 2 (Week 1): Pt will ambulated x 100 ft with LRAD and min assist PT Short Term Goal 3 (Week 1): Pt will participate in berg balance test for assessment of balance deficits  Skilled Therapeutic Interventions/Progress Updates:   Pt received supine in bed and agreeable to therapy session. Supine>sit with heavy reliance on bedrails and close supervision for safety. Sitting EOB donned pants, socks, and shoes max assist for time management. Sit<>stands with CGA for steadying throughout session. Completed LB dressing in standing with CGA/min assist for balance. Stand pivot to w/c, no AD, with mod assist for balance due to poor R LE stepping. Performed ~134ft L hemi-technique w/c propulsion, using LE primarily, with supervision. Donned litegait harness in standing with RW and CGA for balance. Stepped up onto treadmill with min assist for balance using litegait handrails. Performed the following R LE NMR retraining using treadmill and litegait body weight support system: - took 3 trails of walking ~30seconds to coordinate pt's stepping on treadmill as he continued to sit down in the harness during R LE stance phase - 4 minutes on 0.59mph with max assist for R LE swing phase as pt demonstrates toe drag and poor hip flexion for clearance and max multimodal cuing for R LE knee extension during stance phase (to prevent him sitting down in harness) - seated rest break and donned R LE ACE wrap to assist with ankle DF and toe clearance during swing - 25min30sec on 0.48mph slowed to 0.51mph to allow for improved gait mechanics and improved retraining of motor control with  timing/sequencing - continues to require max assist for swing phase, improved toe clearance with ACE wrap but with mod progressed to min assist for stance phase and pt demonstrating improving R quad and glute activation and R weight shift for increased R stance time and improved stance phase Doffed litegait sling in standing with UE support and CGA for steadying.  Ambulated ~63ft overground using RW with min assist for balance and mod assist for R LE swing phase - pt demonstrates ability to carryover sequencing of R LE stance phase with increased hip/knee extension, R lateral weight shift, and R stance time with max multimodal cuing. Performed L hemitechnique w/c propulsion back to room, using LE primarily, with supervision. Pt left sitting in w/c with needs in reach, seat belt alarm on, and telesitter in place.   Therapy Documentation Precautions:  Precautions Precautions: Fall Precaution Comments: right UE and LE hemiparesis Restrictions Weight Bearing Restrictions: No  Pain: Denies pain during session.   Therapy/Group: Individual Therapy  Tawana Scale, PT, DPT 03/10/2019, 7:46 AM

## 2019-03-10 NOTE — Progress Notes (Signed)
[facility] PHYSICAL MEDICINE & REHABILITATION PROGRESS NOTE   Subjective/Complaints: Patient seen laying in bed this morning.  He states he slept well overnight.  He denies complaints.  Review of systems: Denies CP, SOB, N/V/D  Objective:   No results found. No results for input(s): WBC, HGB, HCT, PLT in the last 72 hours. No results for input(s): NA, K, CL, CO2, GLUCOSE, BUN, CREATININE, CALCIUM in the last 72 hours.  Intake/Output Summary (Last 24 hours) at 03/10/2019 1134 Last data filed at 03/10/2019 0527 Gross per 24 hour  Intake 440 ml  Output 1400 ml  Net -960 ml     Physical Exam: Vital Signs Blood pressure (!) 156/95, pulse 68, temperature 98.2 F (36.8 C), temperature source Oral, resp. rate 16, height 5\' 7"  (1.702 m), weight 72.3 kg, SpO2 98 %. Constitutional: No distress . Vital signs reviewed. HENT: Normocephalic.  Atraumatic. Eyes: EOMI.  No discharge. Cardiovascular: No JVD. Respiratory: Normal effort. GI: Non-distended. Musc: No edema or tenderness in extremities. Neurologic: Alert Motor: LUE/LLE: 5/5 RUE: Shoulder abduction, elbow flexion/extension 3/5, handgrip 1/5 Right lower extremity: Hip flexion, knee extension 4/5, ankle dorsiflexion 0/5 Psych: Impulsive.  Assessment/Plan: 1. 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  Physiatrist is providing close team supervision and 24 hour management of active medical problems listed below.  Physiatrist and rehab team continue to assess barriers to discharge/monitor patient progress toward functional and medical goals  Care Tool:  Bathing    Body parts bathed by patient: Right arm, Chest, Abdomen, Front perineal area, Right upper leg, Left upper leg, Left lower leg, Face   Body parts bathed by helper: Left arm, Right lower leg, Buttocks     Bathing assist Assist Level: Moderate  Assistance - Patient 50 - 74%     Upper Body Dressing/Undressing Upper body dressing   What is the patient wearing?: Pull over shirt    Upper body assist Assist Level: Minimal Assistance - Patient > 75%    Lower Body Dressing/Undressing Lower body dressing      What is the patient wearing?: Pants, Incontinence brief     Lower body assist Assist for lower body dressing: Moderate Assistance - Patient 50 - 74%     Toileting Toileting    Toileting assist Assist for toileting: Moderate Assistance - Patient 50 - 74%     Transfers Chair/bed transfer  Transfers assist  Chair/bed transfer activity did not occur: Safety/medical concerns  Chair/bed transfer assist level: Minimal Assistance - Patient > 75%     Locomotion Ambulation   Ambulation assist      Assist level: Moderate Assistance - Patient 50 - 74% Assistive device: Walker-rolling Max distance: 54ft   Walk 10 feet activity   Assist     Assist level: Moderate Assistance - Patient - 50 - 74% Assistive device: Walker-rolling   Walk 50 feet activity   Assist Walk 50 feet with 2 turns activity did not occur: Safety/medical concerns  Assist level: Moderate Assistance - Patient - 50 - 74% Assistive device: Walker-rolling    Walk 150 feet activity   Assist Walk 150 feet activity did not occur: Safety/medical concerns         Walk 10 feet on uneven surface  activity   Assist Walk 10 feet on uneven surfaces activity did not occur: Safety/medical concerns         Wheelchair     Assist  Will patient use wheelchair at discharge?: Yes Type of Wheelchair: Manual    Wheelchair assist level: Set up assist, Supervision/Verbal cueing Max wheelchair distance: 143ft    Wheelchair 50 feet with 2 turns activity    Assist        Assist Level: Set up assist, Supervision/Verbal cueing   Wheelchair 150 feet activity     Assist     Assist Level: Set up assist, Supervision/Verbal  cueing    Medical Problem List and Plan: 1.Right hemiparesis and functional deficitssecondary to left subcortical lacunar infarct.   Continue CIR 2. Antithrombotics: -DVT/anticoagulation:Pharmaceutical:Lovenox -antiplatelet therapy: ASA/Plavix x3 weeks followed by ASA alone 3. Pain Management:N/A 4. Mood:LCSW to follow for evaluation and support -antipsychotic agents: N/A 5. Neuropsych: This patientiscapable of making decisions on hisown behalf.may need neuropsych for adjustment   May need behavior plan if pt is disruptive at noc  6. Skin/Wound Care:Routine pressure re ief measures. Maintain adequate nutrition and hydration status 7. Fluids/Electrolytes/Nutrition:Monitor I/O.  Lytes are normal 8. HTN: Monitor BP bid. Continue Norvasc 10mg  daily ,Coreg 6.25mg  BID   Hydralazine 10 3 times daily started on 8/2 Vitals:   03/09/19 1931 03/10/19 0526  BP: (!) 153/89 (!) 156/95  Pulse: 74 68  Resp: 16 16  Temp: 98 F (36.7 C) 98.2 F (36.8 C)  SpO2: 99% 98%   Elevated on 8/2 9. Dyslipidemia: On Lipitor.  10. Acute angioedema (face/lips): Resolved with d/c ACE and IV solumedrol.  Resumed regular texturefoods without issue. 11.Leucocytosis: Likely due to steroids.   WBCs 20.6 on 7/28  Afebrile  Labs ordered for tomorrow 12.  Constipation will adjust laxatives, have discussed need to call staff at the first sensation of needing to defecate , may need toileting program  13.  Hyperglycemia  Likely secondary to steroids on 7/28, labs ordered for tomorrow  Continue to monitor  LOS: 6 days A FACE TO FACE EVALUATION WAS PERFORMED  Mikaella Escalona Lorie Phenix 03/10/2019, 11:34 AM

## 2019-03-10 NOTE — Progress Notes (Signed)
Family brought food from [employer], which is not compliant with the patient's current Cardiac diet, for his dinner. Alycia, NT reported to this RN the family brought [place] on 03/09/2019 for the patient's dinner. This RN attempted to educate the importance of following the Cardiac diet and limiting his sodium intake. Patient just laughed and continued to eat his mozzarella sticks. Will continue to educate patient.

## 2019-03-11 ENCOUNTER — Inpatient Hospital Stay (HOSPITAL_COMMUNITY): Payer: Medicaid Other

## 2019-03-11 DIAGNOSIS — D72829 Elevated white blood cell count, unspecified: Secondary | ICD-10-CM

## 2019-03-11 DIAGNOSIS — R739 Hyperglycemia, unspecified: Secondary | ICD-10-CM

## 2019-03-11 LAB — CBC
HCT: 45.7 % (ref 39.0–52.0)
Hemoglobin: 15.5 g/dL (ref 13.0–17.0)
MCH: 30.4 pg (ref 26.0–34.0)
MCHC: 33.9 g/dL (ref 30.0–36.0)
MCV: 89.6 fL (ref 80.0–100.0)
Platelets: 241 10*3/uL (ref 150–400)
RBC: 5.1 MIL/uL (ref 4.22–5.81)
RDW: 12.5 % (ref 11.5–15.5)
WBC: 11.9 10*3/uL — ABNORMAL HIGH (ref 4.0–10.5)
nRBC: 0 % (ref 0.0–0.2)

## 2019-03-11 LAB — BASIC METABOLIC PANEL
Anion gap: 9 (ref 5–15)
BUN: 15 mg/dL (ref 6–20)
CO2: 26 mmol/L (ref 22–32)
Calcium: 9.8 mg/dL (ref 8.9–10.3)
Chloride: 102 mmol/L (ref 98–111)
Creatinine, Ser: 1.05 mg/dL (ref 0.61–1.24)
GFR calc Af Amer: >60 mL/min (ref >60)
GFR calc non Af Amer: >60 mL/min (ref >60)
Glucose, Bld: 198 mg/dL — ABNORMAL HIGH (ref 70–99)
Potassium: 4.2 mmol/L (ref 3.5–5.1)
Sodium: 137 mmol/L (ref 135–145)

## 2019-03-11 LAB — HEMOGLOBIN A1C
Hgb A1c MFr Bld: 7.9 % — ABNORMAL HIGH (ref 4.8–5.6)
Mean Plasma Glucose: 180.03 mg/dL

## 2019-03-11 MED ORDER — HYDRALAZINE HCL 25 MG PO TABS
25.0000 mg | ORAL_TABLET | Freq: Three times a day (TID) | ORAL | Status: DC
  Administered 2019-03-11 – 2019-03-12 (×3): 25 mg via ORAL
  Filled 2019-03-11 (×3): qty 1

## 2019-03-11 NOTE — Progress Notes (Signed)
Occupational Therapy Session Note  Patient Details  Name: Casey Petty MRN: [id number] Date of Birth: 1961/06/19  Today's Date: 03/11/2019 OT Individual Time: 1829-9371 OT Individual Time Calculation (min): 75 min    Short Term Goals: Week 1:  OT Short Term Goal 1 (Week 1): Pt will complete UB bathing with supervision following hemi technique. OT Short Term Goal 2 (Week 1): Pt will perform stand pivot transfers to the walk-in shower and toilet with min guard and use of the RW for support. OT Short Term Goal 3 (Week 1): Pt will use the RUE as a stabilizer with min assist during selfcare tasks. OT Short Term Goal 4 (Week 1): Pt will complete LB dressing with min guard assist following hemi dressing techniques.  Skilled Therapeutic Interventions/Progress Updates:    OT intervention with focus on BADL retraining including bathing at shower level and dressing with sit<>stand from w/c, functional tranfsers, standing balance, RUE NMR, activity tolerance, and safety awareness to increase independence with BADLs. Pt requires max verbal cues for safety during transitional movements. Pt requires HOH assistance using RUE to assist with bathing tasks.  Pt requires CGA for standing balance during ADLs. Pt required min A for donning sock on R foot.  Pt transitioned to gym and engaged in [place] per below.  Pt with increased tone/synergistic patterns with active movement. Pt requires min A for stand pivot tranfsers.  Pt returned to room and remained seated in w/c with belt alarm activated, half lap tray in place, and all needs within reach.   Therapy Documentation Precautions:  Precautions Precautions: Fall Precaution Comments: right UE and LE hemiparesis Restrictions Weight Bearing Restrictions: No  Pain: Pain Assessment Pain Scale: 0-10 Pain Score: 0-No pain   Other Treatments: Treatments Neuromuscular Facilitation: Right;Upper Extremity;Forced use;Activity to increase coordination;Activity to  increase motor control;Activity to increase timing and sequencing;Activity to increase sustained activation Weight Bearing Technique Weight Bearing Technique: Yes RUE Weight Bearing Technique: Extended arm seated   Therapy/Group: Individual Therapy  Leroy Libman 03/11/2019, 12:11 PM

## 2019-03-11 NOTE — Progress Notes (Signed)
[place] PHYSICAL MEDICINE & REHABILITATION PROGRESS NOTE   Subjective/Complaints: Patient seen laying in bed this morning.  He states he slept well overnight.  He emphatically states that he would like to shower.  He is questions regarding discharge therapies.  Review of systems: Denies CP, SOB, N/V/D  Objective:   No results found. Recent Labs    03/11/19 0703  WBC 11.9*  HGB 15.5  HCT 45.7  PLT 241   Recent Labs    03/11/19 0703  NA 137  K 4.2  CL 102  CO2 26  GLUCOSE 198*  BUN 15  CREATININE 1.05  CALCIUM 9.8    Intake/Output Summary (Last 24 hours) at 03/11/2019 1111 Last data filed at 03/11/2019 0916 Gross per 24 hour  Intake 600 ml  Output 675 ml  Net -75 ml     Physical Exam: Vital Signs Blood pressure (!) 163/96, pulse 72, temperature 98.4 F (36.9 C), temperature source Oral, resp. rate 19, height 5\' 7"  (1.702 m), weight 72.3 kg, SpO2 100 %. Constitutional: NAD. Vital signs reviewed. HENT: Normocephalic.  Atraumatic. Eyes: EOMI.  No discharge. Cardiovascular: No JVD. Respiratory: Normal effort. GI: Non-distended. Musc: No edema or tenderness in extremities. Neurologic: Alert Motor: LUE/LLE: 5/5 RUE: Shoulder abduction 2-/5, elbow flexion/extension 2/5, handgrip 1/5 Right lower extremity: Hip flexion, knee extension 4/5, ankle dorsiflexion 0/5 Psych: Impulsive.  Assessment/Plan: 1. 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  Physiatrist is providing close team supervision and 24 hour management of active medical problems listed below.  Physiatrist and rehab team continue to assess barriers to discharge/monitor patient progress toward functional and medical goals  Care Tool:  Bathing    Body parts bathed by patient: Right arm, Chest, Abdomen, Front perineal area, Buttocks, Right upper leg, Left upper leg, Left lower leg,  Face   Body parts bathed by helper: Right lower leg, Left arm     Bathing assist Assist Level: Minimal Assistance - Patient > 75%     Upper Body Dressing/Undressing Upper body dressing   What is the patient wearing?: Pull over shirt    Upper body assist Assist Level: Supervision/Verbal cueing    Lower Body Dressing/Undressing Lower body dressing      What is the patient wearing?: Underwear/pull up, Pants     Lower body assist Assist for lower body dressing: Moderate Assistance - Patient 50 - 74%     Toileting Toileting    Toileting assist Assist for toileting: Moderate Assistance - Patient 50 - 74%     Transfers Chair/bed transfer  Transfers assist  Chair/bed transfer activity did not occur: Safety/medical concerns  Chair/bed transfer assist level: Moderate Assistance - Patient 50 - 74%     Locomotion Ambulation   Ambulation assist      Assist level: Moderate Assistance - Patient 50 - 74% Assistive device: Orthosis(railing) Max distance: 25   Walk 10 feet activity   Assist     Assist level: Moderate Assistance - Patient - 50 - 74% Assistive device: Other (comment), Orthosis(railing; ACE for R foot drop)   Walk 50 feet activity   Assist Walk 50 feet with 2 turns activity did not occur: Safety/medical concerns  Assist level: Moderate Assistance - Patient - 50 - 74% Assistive device: Walker-rolling    Walk 150 feet activity   Assist Walk 150 feet activity did not occur: Safety/medical concerns  Walk 10 feet on uneven surface  activity   Assist Walk 10 feet on uneven surfaces activity did not occur: Safety/medical concerns         Wheelchair     Assist Will patient use wheelchair at discharge?: Yes Type of Wheelchair: Manual    Wheelchair assist level: Supervision/Verbal cueing Max wheelchair distance: 150    Wheelchair 50 feet with 2 turns activity    Assist        Assist Level: Supervision/Verbal  cueing   Wheelchair 150 feet activity     Assist     Assist Level: Supervision/Verbal cueing    Medical Problem List and Plan: 1.Right hemiparesis and functional deficitssecondary to left subcortical lacunar infarct.   Continue CIR 2. Antithrombotics: -DVT/anticoagulation:Pharmaceutical:Lovenox -antiplatelet therapy: ASA/Plavix x3 weeks followed by ASA alone 3. Pain Management:N/A 4. Mood:LCSW to follow for evaluation and support -antipsychotic agents: N/A 5. Neuropsych: This patientiscapable of making decisions on hisown behalf.may need neuropsych for adjustment   May need behavior plan if pt is disruptive at noc    Tele-sitter for impulsivity 6. Skin/Wound Care:Routine pressure re ief measures. Maintain adequate nutrition and hydration status 7. Fluids/Electrolytes/Nutrition:Monitor I/O.   8. HTN: Monitor BP bid. Continue Norvasc 10mg  daily ,Coreg 6.25mg  BID   Hydralazine 10 3 times daily started on 8/2, increased to 25 3 times daily on 8/3 Vitals:   03/10/19 1943 03/11/19 0300  BP: (!) 155/88 (!) 163/96  Pulse: 81 72  Resp: 18 19  Temp: 98.6 F (37 C) 98.4 F (36.9 C)  SpO2: 98% 100%   Elevated on 8/3 9. Dyslipidemia: On Lipitor.  10. Acute angioedema (face/lips): Resolved with d/c ACE and IV solumedrol.  Resumed regular texturefoods without issue. 11.Leucocytosis:    WBCs 11.9 on 8/3  Afebrile 12.  Constipation will adjust laxatives, have discussed need to call staff at the first sensation of needing to defecate , may need toileting program  13.  Hyperglycemia  Remains elevated on 8/3, despite discontinuing steroids  Hemoglobin A1c ordered  Continue to monitor  LOS: 7 days A FACE TO FACE EVALUATION WAS PERFORMED  Ayan Heffington Lorie Phenix 03/11/2019, 11:11 AM

## 2019-03-11 NOTE — Plan of Care (Signed)
  Problem: Consults Goal: RH STROKE PATIENT EDUCATION Description: See Patient Education module for education specifics  Outcome: Progressing   Problem: RH BOWEL ELIMINATION Goal: RH STG MANAGE BOWEL WITH ASSISTANCE Description: STG Manage Bowel with Mod I Assistance. Outcome: Progressing   Problem: RH BLADDER ELIMINATION Goal: RH STG MANAGE BLADDER WITH ASSISTANCE Description: STG Manage Bladder Independently  Outcome: Progressing   Problem: RH SAFETY Goal: RH STG ADHERE TO SAFETY PRECAUTIONS W/ASSISTANCE/DEVICE Description: STG Adhere to Safety Precautions With Cues and Supervision  Outcome: Progressing   Problem: RH KNOWLEDGE DEFICIT Goal: RH STG INCREASE KNOWLEDGE OF HYPERTENSION Description: Patient will be able to manage HTN independently using educational handouts.   Outcome: Progressing Goal: RH STG INCREASE KNOWLEDGE OF STROKE PROPHYLAXIS Description: Patient will be able to state management of secondary stroke prevention independently.  Outcome: Progressing   Problem: RH KNOWLEDGE DEFICIT Goal: RH STG INCREASE KNOWLEGDE OF HYPERLIPIDEMIA Description: Patient will be able to state management of HLD Independently  Outcome: Progressing

## 2019-03-11 NOTE — Progress Notes (Signed)
Physical Therapy Session Note  Patient Details  Name: Casey Petty MRN: [id number] Date of Birth: May 25, 1961  Today's Date: 03/11/2019 PT Individual Time: 0800-0900 PT Individual Time Calculation (min): 60 min   Short Term Goals: Week 1:  PT Short Term Goal 1 (Week 1): Pt will perform bed<>chair transfer with min assist PT Short Term Goal 2 (Week 1): Pt will ambulated x 100 ft with LRAD and min assist PT Short Term Goal 3 (Week 1): Pt will participate in berg balance test for assessment of balance deficits  Skilled Therapeutic Interventions/Progress Updates:   Pt resting in bed, shouting out that he needs pants.  PT provided disposable pants.  Pt donned them in supine with mod cues and mod assist to pull over R hip.  Pt impulsive and moving ballistically.  neuromuscular re-education supine: via multimodal cues for bil lower trunk rotation , R heel slides with assistance, and bil hip abduction/adduction with strap around thighs; seated : with visual feedback, heel slides and hip abduction/adduction with wash cloth under foot.  Stand pivot transfer to L with mod assist. PT donned L shoe only.  Gait training iwht L shoe on, R non slip sock (for improved clearance R foot) at hallway railing,x 25' x 2, with mod><min assist for R knee stability; max cues for upright posture forward gaze, sequencing, initiating R hip flexion.  W/c propulsion using hemi method x 150' with supervision.   At end of session, pt resting in w/c with seat belt alarm set and needs at hand.       Therapy Documentation Precautions:  Precautions Precautions: Fall Precaution Comments: right UE and LE hemiparesis Restrictions Weight Bearing Restrictions: No  Pain: pt denies      Therapy/Group: Individual Therapy  Casey Petty 03/11/2019, 9:00 AM

## 2019-03-11 NOTE — Progress Notes (Signed)
Speech Language Pathology Daily Session Note  Patient Details  Name: Casey Petty MRN: [id number] Date of Birth: 28-Jul-1961  Today's Date: 03/11/2019 SLP Individual Time: 1200-1300 SLP Individual Time Calculation (min): 60 min  Short Term Goals: Week 1: SLP Short Term Goal 1 (Week 1): Patient will consume current diet with minimal overt s/s of aspiration with supervision level verbal cues for use of swallowing compensatory strategies. SLP Short Term Goal 2 (Week 1): Patient will utilize speech intelligibility strategies at the phrase level with Mod A verbal cues to achieve ~75% intelligibility. SLP Short Term Goal 3 (Week 1): Patient will demonstrate sustained attention to functional tasks for 30 minutes with Min A verbal cues. SLP Short Term Goal 4 (Week 1): Patient will recall new, daily information with supervision level verbal and visual cues. SLP Short Term Goal 5 (Week 1): Patient will self-monitor and correct errors during functional tasks with Min A verbal cues. SLP Short Term Goal 6 (Week 1): Patient will demonstrate functional problem solving with basic and familiar tasks with Min A verbal cues.  Skilled Therapeutic Interventions: Skilled ST services focused on swallow and cognitive skills. SLP facilitated PO consumption of regular and thin liquid lunch tray, pt consumed large bolus sizes, however demonstrated appropriate oral clearance with no overt s/s aspiration. SLP facilitated basic problem solving skills in basic money management task, pt demonstrated mod I ability to display and subtract change. SLP also facilitated semi-complex problem solving skills in account balancing task, pt demonstrated ability to categorize charges by negative and positive amounts, however required total A for errors when utilzing a calculator and mod A verbal cues to dictate verbal problem solving. Pt required max A verbal cues in verbal problem solving utilizing ALFA. Pt demonstrated like to no awareness  of errors and was impulsive, however following education agreed to errors. Pt requested to get into bed at end of session, SLP provided mod A with squat pivot.  Pt was left in room with call bell within reach and bed alarm set. ST recommends to continue skilled ST services.      Pain Pain Assessment Pain Score: 0-No pain  Therapy/Group: Individual Therapy  Casey Petty 03/11/2019, 1:14 PM

## 2019-03-12 ENCOUNTER — Inpatient Hospital Stay (HOSPITAL_COMMUNITY): Payer: Medicaid Other | Admitting: Occupational Therapy

## 2019-03-12 ENCOUNTER — Inpatient Hospital Stay (HOSPITAL_COMMUNITY): Payer: Medicaid Other | Admitting: Physical Therapy

## 2019-03-12 ENCOUNTER — Encounter (HOSPITAL_COMMUNITY): Payer: Medicaid Other | Admitting: Psychology

## 2019-03-12 ENCOUNTER — Inpatient Hospital Stay (HOSPITAL_COMMUNITY): Payer: Medicaid Other

## 2019-03-12 DIAGNOSIS — I10 Essential (primary) hypertension: Secondary | ICD-10-CM

## 2019-03-12 DIAGNOSIS — R4587 Impulsiveness: Secondary | ICD-10-CM

## 2019-03-12 DIAGNOSIS — E119 Type 2 diabetes mellitus without complications: Secondary | ICD-10-CM

## 2019-03-12 LAB — GLUCOSE, CAPILLARY
Glucose-Capillary: 232 mg/dL — ABNORMAL HIGH (ref 70–99)
Glucose-Capillary: 184 mg/dL — ABNORMAL HIGH (ref 70–99)
Glucose-Capillary: 294 mg/dL — ABNORMAL HIGH (ref 70–99)

## 2019-03-12 MED ORDER — LIVING WELL WITH DIABETES BOOK
Freq: Once | Status: AC
  Administered 2019-03-12: 15:00:00
  Filled 2019-03-12: qty 1

## 2019-03-12 MED ORDER — HYDRALAZINE HCL 50 MG PO TABS
50.0000 mg | ORAL_TABLET | Freq: Three times a day (TID) | ORAL | Status: DC
  Administered 2019-03-12 – 2019-03-13 (×3): 50 mg via ORAL
  Filled 2019-03-12 (×3): qty 1

## 2019-03-12 NOTE — Plan of Care (Signed)
  Problem: Consults Goal: RH STROKE PATIENT EDUCATION Description: See Patient Education module for education specifics  Outcome: Progressing   Problem: RH BOWEL ELIMINATION Goal: RH STG MANAGE BOWEL WITH ASSISTANCE Description: STG Manage Bowel with Mod I Assistance. Outcome: Progressing   Problem: RH BLADDER ELIMINATION Goal: RH STG MANAGE BLADDER WITH ASSISTANCE Description: STG Manage Bladder Independently  Outcome: Progressing   Problem: RH SAFETY Goal: RH STG ADHERE TO SAFETY PRECAUTIONS W/ASSISTANCE/DEVICE Description: STG Adhere to Safety Precautions With Cues and Supervision  Outcome: Progressing   Problem: RH KNOWLEDGE DEFICIT Goal: RH STG INCREASE KNOWLEDGE OF HYPERTENSION Description: Patient will be able to manage HTN independently using educational handouts.   Outcome: Progressing Goal: RH STG INCREASE KNOWLEDGE OF STROKE PROPHYLAXIS Description: Patient will be able to state management of secondary stroke prevention independently.  Outcome: Progressing   Problem: RH KNOWLEDGE DEFICIT Goal: RH STG INCREASE KNOWLEGDE OF HYPERLIPIDEMIA Description: Patient will be able to state management of HLD Independently  Outcome: Progressing

## 2019-03-12 NOTE — Plan of Care (Signed)
  Problem: RH Awareness Goal: LTG: Patient will demonstrate awareness during functional activites type of (SLP) Description: LTG: Patient will demonstrate awareness during functional activites type of (SLP) Flowsheets (Taken 03/12/2019 1255) LTG: Patient will demonstrate awareness during cognitive/linguistic activities with assistance of (SLP): (Downgraded due to lack of progress) Minimal Assistance - Patient > 75% Note: Downgraded due to lack of progress

## 2019-03-12 NOTE — Progress Notes (Signed)
Occupational Therapy Weekly Progress Note  Patient Details  Name: Casey Petty MRN: [id number] Date of Birth: February 21, 1961  Beginning of progress report period: March 05, 2019 End of progress report period: March 12, 2019  Today's Date: 03/12/2019 OT Individual Time: 1403-1500 OT Individual Time Calculation (min): 57 min    Patient has met 2 of 4 short term goals.  Pt completes UB selfcare with min assist currently.  He continues to also need min assist for LB bathing with mod assist for LB dressing.  RUE and RLE hemiparesis continues to limit him with Brunnstrum stage III movement in the arm and only trace movement in the digits at [facility] stage II.  He performs stand pivot transfers at overall min assist level.  RLE proprioception continues to limit his ability to complete transfers safely as at times he does not position his left foot correctly when attempting to take a step.  Feel he will likely need an extension in his projected LOS to meet current supervision level goals.  Recommend continued CIR level therapy to progress to this level.    Patient continues to demonstrate the following deficits: muscle weakness, impaired timing and sequencing, abnormal tone, unbalanced muscle activation, motor apraxia and decreased coordination, decreased attention to right, decreased awareness and decreased safety awareness and decreased sitting balance, decreased standing balance, decreased postural control, hemiplegia and decreased balance strategies and therefore will continue to benefit from skilled OT intervention to enhance overall performance with BADL, iADL and Reduce care partner burden.  Patient progressing toward long term goals..  Continue plan of care.  OT Short Term Goals Week 2:  OT Short Term Goal 1 (Week 2): Pt will complete UB bathing with supervision following hemi techniques. OT Short Term Goal 2 (Week 2): Pt will complete LB dressing sit to stand with supervision except for AFO on the  left foot. OT Short Term Goal 3 (Week 2): Pt will complete LB bathing sit to stand with min guard assist. OT Short Term Goal 4 (Week 2): Pt completed toilet transfer with min guard assist using the RW for support.  Skilled Therapeutic Interventions/Progress Updates:    Pt completed bathing and dressing during session.  Min assist for transfer from the wheelchair to the tub seat.  He was able to complete UB bathing with min assist as well as LB bathing sit to stand.  Min instructional cueing for sequencing as he did not remember to wash the RUE or his lower legs.  Dressing completed sit to stand at the sink with pt needing mod demonstrational cueing to remember to start by dressing the RLE first and then the left.  Min assist for donning underpants and pants with supervision and increased time for donning pullover shirt.  Finished session with hemi technique for donning gripper socks with min guard assist before transferring into the bed with min assist stand pivot.  Pt left with call button in reach and bed alarm in place.  Resting hand splint positioned on the RUE as well.  Performed slight stretches of shoulder flexion with external rotation as well in supine secondary to increased pectoral tightness.    Therapy Documentation Precautions:  Precautions Precautions: Fall Precaution Comments: right UE and LE hemiparesis Restrictions Weight Bearing Restrictions: No  Pain: Pain Assessment Pain Score: 0-No pain ADL: See Care Tool Section for some details of ADL  Therapy/Group: Individual Therapy  Eleina Jergens OTR/L 03/12/2019, 4:25 PM

## 2019-03-12 NOTE — Progress Notes (Signed)
[facility] PHYSICAL MEDICINE & REHABILITATION PROGRESS NOTE   Subjective/Complaints: Patient seen laying in bed this AM.  He states he slept well overnight.  Discussed dietary compliance with nursing and patient, family is bringing high sodium foods from outside, patient denies.  Encouraged use of orthosis, patient does not like to wear.  Review of systems: Denies CP, SOB, N/V/D  Objective:   No results found. Recent Labs    03/11/19 0703  WBC 11.9*  HGB 15.5  HCT 45.7  PLT 241   Recent Labs    03/11/19 0703  NA 137  K 4.2  CL 102  CO2 26  GLUCOSE 198*  BUN 15  CREATININE 1.05  CALCIUM 9.8    Intake/Output Summary (Last 24 hours) at 03/12/2019 1132 Last data filed at 03/12/2019 0902 Gross per 24 hour  Intake 739 ml  Output 1000 ml  Net -261 ml     Physical Exam: Vital Signs Blood pressure (!) 151/92, pulse 72, temperature 98.2 F (36.8 C), resp. rate 18, height 5\' 7"  (1.702 m), weight 72.3 kg, SpO2 100 %. Constitutional: No distress . Vital signs reviewed. HENT: Normocephalic.  Atraumatic. Eyes: EOMI. No discharge. Cardiovascular: No JVD. Respiratory: Normal effort.  No stridor. GI: Non-distended. Skin: Warm and dry.  Intact. Psych: Normal mood.  Normal behavior. Musc: No edema.  No tenderness. Neurologic: Alert  Motor: LUE/LLE: 5/5 RUE: Shoulder abduction 2-/5, elbow flexion/extension 2/5, handgrip 0/5 Right lower extremity: Hip flexion, knee extension 4/5, ankle dorsiflexion 0/5 Psych: Impulsive.  Assessment/Plan: 1. 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  Physiatrist is providing close team supervision and 24 hour management of active medical problems listed below.  Physiatrist and rehab team continue to assess barriers to discharge/monitor patient progress toward functional and medical goals  Care Tool:  Bathing    Body  parts bathed by patient: Right arm, Chest, Abdomen, Front perineal area, Buttocks, Right upper leg, Left upper leg, Left lower leg, Face   Body parts bathed by helper: Right lower leg, Left arm     Bathing assist Assist Level: Minimal Assistance - Patient > 75%     Upper Body Dressing/Undressing Upper body dressing   What is the patient wearing?: Pull over shirt    Upper body assist Assist Level: Supervision/Verbal cueing    Lower Body Dressing/Undressing Lower body dressing      What is the patient wearing?: Underwear/pull up, Pants     Lower body assist Assist for lower body dressing: Moderate Assistance - Patient 50 - 74%     Toileting Toileting    Toileting assist Assist for toileting: Moderate Assistance - Patient 50 - 74%     Transfers Chair/bed transfer  Transfers assist  Chair/bed transfer activity did not occur: Safety/medical concerns  Chair/bed transfer assist level: Moderate Assistance - Patient 50 - 74%     Locomotion Ambulation   Ambulation assist      Assist level: Moderate Assistance - Patient 50 - 74% Assistive device: Orthosis(railing) Max distance: 25   Walk 10 feet activity   Assist     Assist level: Moderate Assistance - Patient - 50 - 74% Assistive device: Other (comment), Orthosis(railing; ACE for R foot drop)   Walk 50 feet activity   Assist Walk 50 feet with 2 turns activity did not occur: Safety/medical concerns  Assist level: Moderate Assistance - Patient - 50 - 74% Assistive device: Walker-rolling  Walk 150 feet activity   Assist Walk 150 feet activity did not occur: Safety/medical concerns         Walk 10 feet on uneven surface  activity   Assist Walk 10 feet on uneven surfaces activity did not occur: Safety/medical concerns         Wheelchair     Assist Will patient use wheelchair at discharge?: Yes Type of Wheelchair: Manual    Wheelchair assist level: Supervision/Verbal cueing Max  wheelchair distance: 150    Wheelchair 50 feet with 2 turns activity    Assist        Assist Level: Supervision/Verbal cueing   Wheelchair 150 feet activity     Assist     Assist Level: Supervision/Verbal cueing    Medical Problem List and Plan: 1.Right hemiparesis and functional deficitssecondary to left subcortical lacunar infarct.   Continue CIR  Encourage compliance with orthosis 2. Antithrombotics: -DVT/anticoagulation:Pharmaceutical:Lovenox -antiplatelet therapy: ASA/Plavix x3 weeks followed by ASA alone 3. Pain Management:N/A 4. Mood:LCSW to follow for evaluation and support -antipsychotic agents: N/A 5. Neuropsych: This patientiscapable of making decisions on hisown behalf.may need neuropsych for adjustment   May need behavior plan if pt is disruptive at noc    Cont tele-sitter for impulsivity 6. Skin/Wound Care:Routine pressure re ief measures. Maintain adequate nutrition and hydration status 7. Fluids/Electrolytes/Nutrition:Monitor I/O.   8. HTN: Monitor BP bid. Continue Norvasc 10mg  daily ,Coreg 6.25mg  BID   Hydralazine 10 3 times daily started on 8/2, increased to 25 3 times daily on 8/3, increased again on 8/4 Vitals:   03/11/19 2000 03/12/19 0531  BP: (!) 151/68 (!) 151/92  Pulse: 87 72  Resp: 18 18  Temp: 98.6 F (37 C) 98.2 F (36.8 C)  SpO2: 99% 100%   Elevated on 8/4 9. Dyslipidemia: On Lipitor.  10. Acute angioedema (face/lips): Resolved with d/c ACE and IV solumedrol.  Resumed regular texturefoods without issue. 11.Leucocytosis:    WBCs 11.9 on 8/3  Afebrile 12.  Constipation will adjust laxatives, have discussed need to call staff at the first sensation of needing to defecate , may need toileting program  13.  New diagnosis of diabetes mellitus  Hemoglobin A1c 7.9 on 8/4  CBGs ordered  Diabetic education  Continue to monitor  LOS: 8 days A FACE TO FACE EVALUATION WAS PERFORMED  Casey Petty  Casey Petty 03/12/2019, 11:32 AM

## 2019-03-12 NOTE — Progress Notes (Signed)
Physical Therapy Weekly Progress Note  Patient Details  Name: Casey Petty MRN: [id number] Date of Birth: 1961/04/22  Beginning of progress report period: March 05, 2019 End of progress report period: March 12, 2019  Today's Date: 03/12/2019 PT Individual Time: 0806-0919 PT Individual Time Calculation (min): 73 min   Patient has met 3 of 3 short term goals. Patient progressing well with therapy and demonstrates improved R LE NMR during gait training; however, continues to demonstrate impaired hip/knee flexion activation during swing phase. Patient continues to demonstrates impaired safety awareness and impaired balance during standing tasks. Patient scored 16/56 on Berg Balance Test demonstrating significantly high risk for falls.  Patient continues to demonstrate the following deficits muscle weakness and muscle paralysis, decreased cardiorespiratoy endurance, impaired timing and sequencing, abnormal tone, unbalanced muscle activation, decreased coordination and decreased motor planning, decreased midline orientation and decreased attention to right, decreased initiation, decreased attention, decreased awareness, decreased problem solving, decreased safety awareness and delayed processing and decreased standing balance, decreased postural control and decreased balance strategies and therefore will continue to benefit from skilled PT intervention to increase functional independence with mobility.  Patient progressing toward long term goals..  Continue plan of care.  PT Short Term Goals Week 1:  PT Short Term Goal 1 (Week 1): Pt will perform bed<>chair transfer with min assist PT Short Term Goal 1 - Progress (Week 1): Met PT Short Term Goal 2 (Week 1): Pt will ambulated x 100 ft with LRAD and min assist PT Short Term Goal 2 - Progress (Week 1): Met PT Short Term Goal 3 (Week 1): Pt will participate in berg balance test for assessment of balance deficits PT Short Term Goal 3 - Progress (Week 1):  Met Week 2:  PT Short Term Goal 1 (Week 2): = to LTGs based on ELOS  Skilled Therapeutic Interventions/Progress Updates:  Ambulation/gait training;Balance/vestibular training;Cognitive remediation/compensation;Community reintegration;Discharge planning;Disease management/prevention;Patient/family education;Therapeutic Exercise;Therapeutic Activities;Stair training;Neuromuscular re-education;Wheelchair propulsion/positioning;Visual/perceptual remediation/compensation;Functional mobility training;UE/LE Coordination activities;Functional electrical stimulation;DME/adaptive equipment instruction;Psychosocial support;UE/LE Strength taining/ROM;Splinting/orthotics  Pt received supine in bed and agreeable to therapy session. Supine>sit with heavy reliance on bedrails and supervision for safety. Donned shoes max assist for time management. Stand pivot EOB>w/c using bedrail for support with CGA for steadying. Performed ~155f L hemi-technique w/c propulsion, using LE primarily, with supervision and set-up assist. Stand pivot w/c>EOM using armrest for balance with CGA for steadying. [facility] increased [facility] risk as noted by score of 16/56. (<36= high risk for falls, close to 100%; 37-45 significant >80%; 46-51 moderate >50%; 52-55 lower >25%). Donned R LE posterior leaf spring AFO for remainder of session. Pt ambulated ~1060fusing RW with min/mod assist for balance x1 anterior LOB due to pt unable to pull R LE through during swing and mod/max manual facilitation/assist for R LE swing phase and max cuing for sequencing. Performed repeated L LE step up/down on 4" step, no UE support, with min assist for balance (due to R and anterior lean) focusing on R LE NMR to activate hip/knee extensors and R weight shift to replicate stance phase of gait. Performed repeated R LE step up/down on 4" step, no UE support, with min assist for balance and max manual  facilitation/assist for increased R LE hip/knee flexion. Performed repeated R LE knee highs to external target focusing on hip/knee flexion, no UE support, min assist for balance and max assist/facilitation for R LE flexion as pt continues to demonstrate quad activation to kick R LE straight  forward. Ambulates 144f using RW with min assist for balance and 1x mod assist due to anterior LOB when pt not pulling R LE through fully during swing - continue to provide max assist/manual facilitation for increased R LE hip/knee flexion during swing phase. Pt performed ~2065fL hemi-technique w/c propulsion back to room with supervision and set-up assist. Pt left sitting in room with needs in reach and seat belt alarm on.  Therapy Documentation Precautions:  Precautions Precautions: Fall Precaution Comments: right UE and LE hemiparesis Restrictions Weight Bearing Restrictions: No  Pain:   Denies pain during session.   Balance: Balance Balance Assessed: Yes Standardized Balance Assessment Standardized Balance Assessment: Berg Balance Test Berg Balance Test Sit to Stand: Able to stand  independently using hands Standing Unsupported: Able to stand 2 minutes with supervision Sitting with Back Unsupported but Feet Supported on Floor or Stool: Able to sit safely and securely 2 minutes Stand to Sit: Sits safely with minimal use of hands Transfers: Able to transfer with verbal cueing and /or supervision Standing Unsupported with Eyes Closed: Needs help to keep from falling Standing Ubsupported with Feet Together: Needs help to attain position and unable to hold for 15 seconds From Standing, Reach Forward with Outstretched Arm: Loses balance while trying/requires external support From Standing Position, Pick up Object from Floor: Unable to try/needs assist to keep balance From Standing Position, Turn to Look Behind Over each Shoulder: Needs assist to keep from losing balance and falling Turn 360 Degrees:  Needs assistance while turning Standing Unsupported, Alternately Place Feet on Step/Stool: Needs assistance to keep from falling or unable to try Standing Unsupported, One Foot in Front: Loses balance while stepping or standing Standing on One Leg: Unable to try or needs assist to prevent fall Total Score: 16/56   Therapy/Group: Individual Therapy  CaTawana ScalePT, DPT 03/12/2019, 8:15 AM

## 2019-03-12 NOTE — Consult Note (Signed)
Neuropsychological Consultation   Patient:   Casey Petty   DOB:   April 05, 1961  MR Number:  [id number]  Location:  [place] [facility] [place] [id number] [place] [address] Dept: [phone] BSJ: [phone]           Date of Service:   03/12/2019  Start Time:   1 PM End Time:   2 PM  Provider/Observer:  Ilean Skill, Psy.D.       Clinical Neuropsychologist       Billing Code/Service: [phone]  Chief Complaint:    [name] is a 58 year old male with history of HTN no meds for past 3 year.  Admitted on 03/01/19 with two day history of right sided weakness with RLE pain and difficulty walking and slurred speech.  Elevated BP at admission.  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  Patient with greater motor deficits right vs left.  Speech still slurred due to motor deficits.    Reason for Service:  The patient was referred for neuropsychological consultation due to coping and adjustment issues.  Below is the HPI for the current admission.  KPT:WSFKCLE Casey Petty is a 58 year old male with history of HTN--no meds X 3 yearswho was admitted on 03/01/2019 with 2-day history of right-sided weakness with RLE pain and difficulty walkingand slurred speech.Blood pressure elevated at admission andUDS negative. MRI lumbar spine showed moderate bilateral L5 foraminal stenosis and mild to moderate facet arthropathy more pronounced at L4-L5. 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 2 D echo done revealing mild concentric LVH with EF >65% and no shunt. Carotid dopplers were negative for significant ICA stenosis. Dr. [name] felt that stroke was due to small vessel  disease and recommended ASA/Plavix for 3 weeks followed by ASA alone. Blood pressures continue to be labile and medications being titrated. He developed angioedema with addition of Lisinopril therefore this was discontinued and he received IV solumedrol with improvement. Patient with anarthria/dysarthria with mild receptive deficits and left sided weakness affecting mobility and ADLs. CIR recommended due to functional decline.  Current Status:  Patient in good mood and oriented x4.  Expressive language deficits appear to be due to motor deficits more than word finding or fluency issues.  Patient eager to finish rehab and get back home.  Mood positive.  Getting information from family do some degree most of which is encouraging but some confusing him with rehab efforts.    Behavioral Observation: Casey Petty  presents as a 58 y.o.-year-old Right African American Male who appeared his stated age. his dress was Appropriate and he was Well Groomed and his manners were Appropriate to the situation.  his participation was indicative of Appropriate and Redirectable behaviors.  There were any physical disabilities noted.  he displayed an appropriate level of cooperation and motivation.     Interactions:    Active Appropriate and Redirectable  Attention:   abnormal and attention span appeared shorter than expected for age  Memory:   within normal limits; recent and remote memory intact  Visuo-spatial:  not examined  Speech (Volume):  normal  Speech:   slurred;   Thought Process:  Coherent and Relevant  Though Content:  WNL; not suicidal and not homicidal  Orientation:   person, place, time/date  and situation  Judgment:   Fair  Planning:   Fair  Affect:    Appropriate  Mood:    Euthymic  Insight:   Fair  Intelligence:   normal  Medical History:   Past Medical History:  Diagnosis Date  . Hypertension    Family Med/Psych History:  Family History  Problem Relation Age of Onset  .  Heart attack Father     Impression/DX:  Casey Petty is a 58 year old male with history of HTN no meds for past 3 year.  Admitted on 03/01/19 with two day history of right sided weakness with RLE pain and difficulty walking and slurred speech.  Elevated BP at admission.  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  Patient with greater motor deficits right vs left.  Speech still slurred due to motor deficits.    Patient in good mood and oriented x4.  Expressive language deficits appear to be due to motor deficits more than word finding or fluency issues.  Patient eager to finish rehab and get back home.  Mood positive.  Getting information from family do some degree most of which is encouraging but some confusing him with rehab efforts.        Electronically Signed   _______________________ Ilean Skill, Psy.D.

## 2019-03-12 NOTE — Progress Notes (Addendum)
Speech Language Pathology Weekly Progress and Session Note  Patient Details  Name: Casey Petty MRN: [id number] Date of Birth: 1961-02-17  Beginning of progress report period: March 06, 2019 End of progress report period: March 12, 2019  Today's Date: 03/12/2019 SLP Individual Time: 8182-9937 SLP Individual Time Calculation (min): 28 min  Short Term Goals: Week 1: SLP Short Term Goal 1 (Week 1): Patient will consume current diet with minimal overt s/s of aspiration with supervision level verbal cues for use of swallowing compensatory strategies. SLP Short Term Goal 1 - Progress (Week 1): Met SLP Short Term Goal 2 (Week 1): Patient will utilize speech intelligibility strategies at the phrase level with Mod A verbal cues to achieve ~75% intelligibility. SLP Short Term Goal 2 - Progress (Week 1): Met SLP Short Term Goal 3 (Week 1): Patient will demonstrate sustained attention to functional tasks for 30 minutes with Min A verbal cues. SLP Short Term Goal 3 - Progress (Week 1): Met SLP Short Term Goal 4 (Week 1): Patient will recall new, daily information with supervision level verbal and visual cues. SLP Short Term Goal 4 - Progress (Week 1): Met SLP Short Term Goal 5 (Week 1): Patient will self-monitor and correct errors during functional tasks with Min A verbal cues. SLP Short Term Goal 5 - Progress (Week 1): Not met SLP Short Term Goal 6 (Week 1): Patient will demonstrate functional problem solving with basic and familiar tasks with Min A verbal cues. SLP Short Term Goal 6 - Progress (Week 1): Met    New Short Term Goals: Week 2: SLP Short Term Goal 1 (Week 2): Patient will utilize speech intelligibility strategies at the phrase level with Min A verbal cues to achieve ~75% intelligibility. SLP Short Term Goal 2 (Week 2): Patient will demonstrate selective attention to functional tasks for 30 minutes with Mod A verbal cues. SLP Short Term Goal 3 (Week 2): Patient will demonstrate  functional problem solving in semi-complex tasks with Min A verbal cues.  Weekly Progress Updates: Pt made great progress meeting 5 out 6 goals. Pt demonstrates ability to preform basic problem solving skills (such as basic money management) and is progressing towards semi-complex problem solving skills, limited primarily by impulsivity and reduced emergent awareness. Pt demonstrates sustained attention to tasks and emerging selective attention, as well as recall of novel and daily events. Two ST spoke with pt's girlfriend on two  occasions, who supported baseline speech intelligibility deficits with small acute changes. SLP downgraded LTG speech intelligibility goal to reflect baseline deficits and emergent awareness goals. SLP upgraded LTG from basic problem solving to semi-complex problem solving and from sustained attention to selective attention goals. Pt would benefit from skilled ST services in order to maximize functional independence and reduce burden of care, likely requiring 24 hour supervision and continue ST services.     Intensity: Minumum of 1-2 x/day, 30 to 90 minutes Frequency: 3 to 5 out of 7 days Duration/Length of Stay: 8/12 Treatment/Interventions: Cognitive remediation/compensation;Speech/Language facilitation;Cueing hierarchy;Environmental controls;Therapeutic Activities;Patient/family education;Functional tasks;Internal/external aids;Dysphagia/aspiration precaution training   Daily Session  Skilled Therapeutic Interventions:  Skilled ST services focused on cognitive skills. SLP facilitated semi-complex problem solving and emergent awareness skills utilizing PEG design tasks, pt required mod A verbal cues. SLP discussed physical progress with pt, pt expressed desire to continue services via [facility] verse extending inpatient stay. Pt also expressed need to incorporate "[place]" medicine once returning home, SLP provided education on neurological impact of RUE and RLE weakness/control,  pt appeared to  understand. Pt was left in room with call bell within reach and chair alarm set. ST recommends to continue skilled ST services.  General    Pain Pain Assessment Pain Scale: Faces Pain Score: 0-No pain  Therapy/Group: Individual Therapy  Darey Hershberger  [facility] 03/12/2019, 12:50 PM

## 2019-03-12 NOTE — Progress Notes (Signed)
Occupational Therapy Session Note  Patient Details  Name: Casey Petty MRN: [id number] Date of Birth: [birth year]  Today's Date: 03/12/2019 OT Individual Time: 3354-5625 OT Individual Time Calculation (min): 47 min    Short Term Goals: Week 1:  OT Short Term Goal 1 (Week 1): Pt will complete UB bathing with supervision following hemi technique. OT Short Term Goal 2 (Week 1): Pt will perform stand pivot transfers to the walk-in shower and toilet with min guard and use of the RW for support. OT Short Term Goal 3 (Week 1): Pt will use the RUE as a stabilizer with min assist during selfcare tasks. OT Short Term Goal 4 (Week 1): Pt will complete LB dressing with min guard assist following hemi dressing techniques.  Skilled Therapeutic Interventions/Progress Updates:    Pt transferred from the wheelchair to the therapy mat with min assist stand pivot.  Once on the mat focused on scapular mobilizations for the RUE to start and then gentle stretching to the elbow flexors and wrist and digit flexors.  Then transitioned to weightbearing tasks through the RUE while reaching across his body with the LUE.  Min assist for activation of the RUE and for maintaining sitting balance when reaching.  Progressed to squat position and reaching as well to increase proprioception and weightbearing through the RLE as well.  Next had pt work on active right shoulder and elbow on tilted stool and having to sustain activation to keep stool balanced to target.  Min facilitation to avoid overcompensation at the trunk.  Finished session with transfer back to the room and pt left sitting up for lunch with call button and phone in reach and safety alarm belt in place.    Therapy Documentation Precautions:  Precautions Precautions: Fall Precaution Comments: right UE and LE hemiparesis Restrictions Weight Bearing Restrictions: No  Pain: Pain Assessment Pain Scale: Faces Pain Score: 0-No pain  Therapy/Group: Individual  Therapy  Salahuddin Arismendez OTR/L 03/12/2019, 12:48 PM

## 2019-03-12 NOTE — Plan of Care (Signed)
  Problem: RH Problem Solving Goal: LTG Patient will demonstrate problem solving for (SLP) Description: LTG:  Patient will demonstrate problem solving for basic/complex daily situations with cues  (SLP) Flowsheets (Taken 03/12/2019 1241) LTG: Patient will demonstrate problem solving for (SLP): (semi-complex) -- LTG Patient will demonstrate problem solving for: (upgraded due to progress) Minimal Assistance - Patient > 75% Note: Upgraded due to progress    Problem: RH Attention Goal: LTG Patient will demonstrate this level of attention during functional activites (SLP) Description: LTG:  Patient will will demonstrate this level of attention during functional activites (SLP) Flowsheets Taken 03/12/2019 1241 by Charolett Bumpers, [place] Patient will demonstrate during cognitive/linguistic activities the attention type of: (upgraded due to progress) Selective LTG: Patient will demonstrate this level of attention during cognitive/linguistic activities with assistance of (SLP): Moderate Assistance - Patient 50 - 74% Taken 03/05/2019 1457 by Buzzy Han, CCC-SLP Patient will demonstrate this level of attention during cognitive/linguistic activities in: Controlled Note: Upgraded due to progress

## 2019-03-12 NOTE — Progress Notes (Signed)
Inpatient Diabetes Program Recommendations  AACE/ADA: New Consensus Statement on Inpatient Glycemic Control (2015)  Target Ranges:  Prepandial:   less than 140 mg/dL      Peak postprandial:   less than 180 mg/dL (1-2 hours)      Critically ill patients:  140 - 180 mg/dL   Lab Results  Component Value Date   GLUCAP 232 (H) 03/12/2019   HGBA1C 7.9 (H) 03/11/2019    Review of Glycemic Control Results for Casey Petty, Casey Petty (MRN [id number]) as of 03/12/2019 15:27  Ref. Range 03/04/2019 05:49 03/05/2019 07:02 03/11/2019 07:03  Glucose Latest Ref Range: 70 - 99 mg/dL 241 (H) 151 (H) 198 (H)   Results for Casey Petty, Casey Petty (MRN [id number]) as of 03/12/2019 15:27  Ref. Range 03/12/2019 12:04  Glucose-Capillary Latest Ref Range: 70 - 99 mg/dL 232 (H)    Patient prefers to start orals inpatient instead of starting insulin correction.  Consider Metformin 500 mg bid  Spoke with patient about new diabetes diagnosis.  Noted a bag of candy at bedside.  Discussed A1C results (7.9% this admission) and explained what an A1C is. Discussed basic pathophysiology of DM Type 2, basic home care, importance of checking CBGs and maintaining good CBG control to prevent long-term and short-term complications.   Reviewed glucose and A1C goals.  Reviewed signs and symptoms of hyperglycemia and hypoglycemia along with treatment for both. Discussed impact of nutrition, exercise, stress, sickness, and medications on diabetes control. Spoke with patient about diet modification and for him to be as physically active as he can be. Patient reports drinking orange juice at home.  Patient with Living Well with DM booklet at bedside. Asked patient to check his glucose everyday.  Discussed with patient that he may be prescribed oral medication for DM management outpatient.   Patient jokingly speaking about smoking a joint. I told him I do not condone him smoking.   Spoke with RN. RN expresses patient jokes around a lot. Patient  may need reinforcement of education.  RN said patient for possible d/c next Wednesday August 12.   RNs to provide ongoing basic DM education at bedside with this patient.   Thanks, Tama Headings RN, MSN, BC-ADM Inpatient Diabetes Coordinator Team Pager [phone] (8a-5p)

## 2019-03-12 NOTE — Plan of Care (Signed)
  Problem: RH Expression Communication Goal: LTG Patient will increase speech intelligibility (SLP) Description: LTG: Patient will increase speech intelligibility at word/phrase/conversation level with cues, % of the time (SLP) Flowsheets Taken 03/12/2019 1250 by Charolett Bumpers, CCC-SLP LTG: Patient will increase speech intelligibility (SLP): (reflect baseline deficits) Minimal Assistance - Patient > 75% Taken 03/05/2019 1457 by Buzzy Han, CCC-SLP Percent of time patient will use intelligible speech: sentence Note: Reflect baseline deficits

## 2019-03-13 ENCOUNTER — Inpatient Hospital Stay (HOSPITAL_COMMUNITY): Payer: Medicaid Other | Admitting: Speech Pathology

## 2019-03-13 ENCOUNTER — Inpatient Hospital Stay (HOSPITAL_COMMUNITY): Payer: Medicaid Other

## 2019-03-13 ENCOUNTER — Inpatient Hospital Stay (HOSPITAL_COMMUNITY): Payer: Medicaid Other | Admitting: Physical Therapy

## 2019-03-13 ENCOUNTER — Inpatient Hospital Stay (HOSPITAL_COMMUNITY): Payer: Medicaid Other | Admitting: Occupational Therapy

## 2019-03-13 DIAGNOSIS — Z91199 Patient's noncompliance with other medical treatment and regimen due to unspecified reason: Secondary | ICD-10-CM

## 2019-03-13 DIAGNOSIS — Z9119 Patient's noncompliance with other medical treatment and regimen: Secondary | ICD-10-CM

## 2019-03-13 DIAGNOSIS — E1165 Type 2 diabetes mellitus with hyperglycemia: Secondary | ICD-10-CM

## 2019-03-13 LAB — GLUCOSE, CAPILLARY
Glucose-Capillary: 203 mg/dL — ABNORMAL HIGH (ref 70–99)
Glucose-Capillary: 209 mg/dL — ABNORMAL HIGH (ref 70–99)
Glucose-Capillary: 205 mg/dL — ABNORMAL HIGH (ref 70–99)
Glucose-Capillary: 126 mg/dL — ABNORMAL HIGH (ref 70–99)

## 2019-03-13 MED ORDER — HYDRALAZINE HCL 50 MG PO TABS
75.0000 mg | ORAL_TABLET | Freq: Three times a day (TID) | ORAL | Status: DC
  Administered 2019-03-13 – 2019-03-15 (×5): 75 mg via ORAL
  Filled 2019-03-13 (×5): qty 1

## 2019-03-13 MED ORDER — INSULIN ASPART 100 UNIT/ML ~~LOC~~ SOLN
0.0000 [IU] | Freq: Three times a day (TID) | SUBCUTANEOUS | Status: DC
  Administered 2019-03-13: 3 [IU] via SUBCUTANEOUS
  Administered 2019-03-14: 2 [IU] via SUBCUTANEOUS
  Administered 2019-03-14: 3 [IU] via SUBCUTANEOUS
  Administered 2019-03-14: 1 [IU] via SUBCUTANEOUS
  Administered 2019-03-15: 3 [IU] via SUBCUTANEOUS
  Administered 2019-03-15 – 2019-03-17 (×5): 2 [IU] via SUBCUTANEOUS
  Administered 2019-03-17: 1 [IU] via SUBCUTANEOUS
  Administered 2019-03-17: 3 [IU] via SUBCUTANEOUS
  Administered 2019-03-18: 1 [IU] via SUBCUTANEOUS
  Administered 2019-03-18: 2 [IU] via SUBCUTANEOUS
  Administered 2019-03-18 – 2019-03-19 (×3): 1 [IU] via SUBCUTANEOUS
  Administered 2019-03-19 – 2019-03-20 (×2): 2 [IU] via SUBCUTANEOUS

## 2019-03-13 MED ORDER — INSULIN ASPART 100 UNIT/ML ~~LOC~~ SOLN
0.0000 [IU] | Freq: Every day | SUBCUTANEOUS | Status: DC
  Administered 2019-03-16 – 2019-03-17 (×2): 2 [IU] via SUBCUTANEOUS

## 2019-03-13 MED ORDER — METFORMIN HCL 500 MG PO TABS
500.0000 mg | ORAL_TABLET | Freq: Two times a day (BID) | ORAL | Status: DC
  Administered 2019-03-13 – 2019-03-15 (×4): 500 mg via ORAL
  Filled 2019-03-13 (×4): qty 1

## 2019-03-13 NOTE — Progress Notes (Signed)
Pt non-compliant with plan of care. Pt hasn't been disregarding ordered cardiac diet, and instead intake being mostly fast food and sweets. Pt is a new diabetic with no coverage, and CBGs have been in the high 200s. Pt was educated on possible complications if diet is not controlled. Pt seems to believe that oral diabetic pill will fundamentally control blood sugar, allowing pt to continue eating high sodium meals. Pt was educated but, at times was irritated when trying to reinforce education.    Pt has refused splint and ortho boot, was also educated on possible complications. Will continue to monitor.

## 2019-03-13 NOTE — Progress Notes (Signed)
Social Work Patient ID: Casey Petty, male   DOB: Dec 20, 1960, 58 y.o.   MRN: [id number]  Met with pt and girlfriend who was here to inform team conference progress toward his goals of supervision-min assist level. Had girlfriend go to PT session with him to begin learning his care. She is aware of pt's needing 24 hr care upon discharge. Will continue to work on needs til discharge 8/12.

## 2019-03-13 NOTE — Progress Notes (Signed)
Physical Therapy Session Note  Patient Details  Name: Casey Petty MRN: [id number] Date of Birth: [birth year]  Today's Date: 03/13/2019 PT Individual Time: 1300-1415 PT Individual Time Calculation (min): 75 min   Short Term Goals:  Week 2 STGs = LTGs due to LOS  Skilled Therapeutic Interventions/Progress Updates:  Pt's GF Andria Frames here for family ed.  She return -demonstrated basic and simulated car transfers,  gentle hip and knee AROM in supine, donning and doffing RAFO and shoes, guarding for bed mobility, managing wc and RW.  She would benefit from further ed closer to d/c date.  Pt transferred w/c >< mat without  RW, RAFO with min assist, mod cues for safety and sequencing.  Wc<> Simulated car transfer with min assist with RW.  W/c propulsion using hemi method x 150'   neuromuscular re-education via multimodal cues for supine: bil bridging after set up in hook lying, R unilateral bridging, R hip abduction/addduction with R foot on ball, R heel slides, bil hip abduction/adduction and R unilateral hip abduction with flexed knee, x 15 each.  In sitting: R heel slides and hip abduction/adduction with flexed knee.  Up/down (4) 6" high steps with  L rail with min/mod assist, turned sideways.  Pt had difficulty bending RLE in order to place R foot on step when descending, requiring physical assistance.  Pt has 10 concrete steps L rail to enter apt.    At end of session, pt resting in w/c with shoes and AFO doffed, seat belt alarm set and needs at hand.     Therapy Documentation Precautions:  Precautions Precautions: Fall Precaution Comments: right UE and LE hemiparesis Restrictions Weight Bearing Restrictions: No  Pain: pt denies      Therapy/Group: Individual Therapy  Lindi Abram 03/13/2019, 4:26 PM

## 2019-03-13 NOTE — Progress Notes (Signed)
Physical Therapy Session Note  Patient Details  Name: Casey Petty MRN: [id number] Date of Birth: [birth year]  Today's Date: 03/13/2019 PT Individual Time: 0800-0830 PT Individual Time Calculation (min): 30 min   Short Term Goals: Week 2:  PT Short Term Goal 1 (Week 2): = to LTGs based on ELOS  Skilled Therapeutic Interventions/Progress Updates:    Pt received seated in bed, agreeable to PT session. Supine to sit with CGA. Dependent to don B shoes and R AFO. Stand pivot transfer bed to w/c with mod A. Manual w/c propulsion 2 x 150 ft with use of L UE/LE and Supervision. Stand pivot transfer w/c to/from mat table with min to mod A. Sit to stand with min A x 5 reps with no AD with focus on achieving static standing balance with no UE support. Attempt standing alt L/R 4" step-taps, pt unable to accurately place RLE on step without assist. Standing alt L/R target taps with min A for standing balance with RW for support with focus on BLE coordination and heel strike for pre-gait training. Pt left seated in w/c in room with needs in reach, quick release belt and chair alarm in place, telesitter present at end of session.  Therapy Documentation Precautions:  Precautions Precautions: Fall Precaution Comments: right UE and LE hemiparesis Restrictions Weight Bearing Restrictions: No    Therapy/Group: Individual Therapy   Excell Seltzer, PT, DPT  03/13/2019, 9:42 AM

## 2019-03-13 NOTE — Progress Notes (Signed)
Occupational Therapy Session Note  Patient Details  Name: Casey Petty MRN: [id number] Date of Birth: 1961-02-14  Today's Date: 03/13/2019 OT Individual Time: 0900-1001 OT Individual Time Calculation (min): 61 min    Short Term Goals: Week 2:  OT Short Term Goal 1 (Week 2): Pt will complete UB bathing with supervision following hemi techniques. OT Short Term Goal 2 (Week 2): Pt will complete LB dressing sit to stand with supervision except for AFO on the left foot. OT Short Term Goal 3 (Week 2): Pt will complete LB bathing sit to stand with min guard assist. OT Short Term Goal 4 (Week 2): Pt completed toilet transfer with min guard assist using the RW for support.  Skilled Therapeutic Interventions/Progress Updates:    Pt completed wheelchair mobility down to the therapy gym with occasional min assist.  Once in the gym had pt complete stand pivot transfer to the therapy mat with min assist.  Discussed with him the benefit of staying a little longer here for therapy but he was adamantly against it, becoming agitated and loud when therapist recommended it.  Feel he will not be compliant, so will not push it further.  Worked on RUE neuromuscular re-education in sitting and supine during session.  He was able to transition to supine with supervision and therapist had him work on bilateral shoulder flexion using hula hoop.  Therapist applied ace bandage to the right hand to assist with grip while working on movements.  Noted increased flexor tone in the right adductors and internal rotators as well as elbow flexors.  He transitioned to sitting as well to work on active movement along with 12 mins of NMES of the digit extensors.  Increased tone noted in the thumb flexors with activation as well as increased shoulder hike and elbow flexion.  He tolerated intensity at level 20 with PPS at 35 and pulse width at 300.  On time/off time 10 seconds each.  No adverse reactions to stimuli noted.  Finished session  with pt returning to his wheelchair and back to the room where he completed a toilet transfer with min assist.  Pt left with NT to finish toileting.   Therapy Documentation Precautions:  Precautions Precautions: Fall Precaution Comments: right UE and LE hemiparesis Restrictions Weight Bearing Restrictions: No  Pain: Pain Assessment Pain Scale: Faces Pain Score: 0-No pain  Therapy/Group: Individual Therapy  Lilit Cinelli OTR/L 03/13/2019, 11:29 AM

## 2019-03-13 NOTE — Patient Care Conference (Signed)
Inpatient RehabilitationTeam Conference and Plan of Care Update Date: 03/13/2019   Time: 11:15 AM    Patient Name: Casey Petty      Medical Record Number: [id number]  Date of Birth: [birth year] Sex: Male         Room/Bed: 4W21C/4W21C-01 Payor Info: Payor: MEDICAID PENDING / Plan: MEDICAID PENDING / Product Type: *No Product type* /    Admitting Diagnosis: 4. CVA 2 Team  LT. CVA; 16-19days  Admit Date/Time:  03/04/2019  1:19 PM Admission Comments: No comment available   Primary Diagnosis:  <principal problem not specified> Principal Problem: <principal problem not specified>  Patient Active Problem List   Diagnosis Date Noted  . Uncontrolled type 2 diabetes mellitus with hyperglycemia ([place])   . Noncompliance   . Benign essential HTN   . Impulsive   . Newly diagnosed diabetes ([place])   . Leucocytosis   . Right hemiparesis ([place])   . Basal ganglia stroke ([place]) 03/04/2019  . Stroke ([place]) 02/28/2019  . Essential hypertension 02/28/2019  . Tobacco abuse 02/28/2019  . Hyperglycemia 02/28/2019    Expected Discharge Date: Expected Discharge Date: 03/20/19  Team Members Present: Physician leading conference: Dr. Delice Lesch Social Worker Present: Lennart Pall, LCSW;Becky DuPree, LCSW Nurse Present: Isla Pence, RN PT Present: Georjean Mode, PT OT Present: Clyda Greener, OT SLP Present: Stormy Fabian, SLP PPS Coordinator present : Gunnar Fusi, SLP     Current Status/Progress Goal Weekly Team Focus  Medical   Left hemiparesis and functional deficits secondary to right thalamic ICH  Improve mobility, HTN/New DM, WBCs, impulsivity  See above   Bowel/Bladder   continent of B&B  maintain being continent  assess toilet needs per shift and as needed   Swallow/Nutrition/ Hydration   Reg/thin - Mod I  Mod I - goal met      ADL's   Min assist for bathing and for most dressing overall.  Min assist for functional transfers stand pivot to the wheelchair, shower, and toilet.  Brunnstrum  stage III in the right arm and in the right hand.  supervision overall  Neuromuscular re-education, selfcare retraining, balance retraining, transfer training, pt/family education, therapeutic exercise   Mobility   supervision bed mobility, CGA sit<>stand, min assist stand pivot transfers, min/mod assist gait 18f using RW, supervision and set-up assist 2044fw/c L hemi-technique propulsion  supervision-min assist  standing balance, awareness, R neuromusclar re-education, gait training, safety awareness, pt education   Communication   Mod A  Min  A sentence level - downgraded to reflect baseline deficits  speech intelligibility stratetgies, awareness   Safety/Cognition/ Behavioral Observations  Mod - Min A  Min A -semi-complex upgrade, mod A selective attention - upgraded, Min A- emergent downgraded, Supervision A - recal (same!)  semi-complex problem solving, emergent awareness, recall and selective attention   Pain   no complaint of pain  remain free of pain  assess pain needs per shift and as needed   Skin   skin is intact  prevent new breakdown of skin  assess skin per shift and as needed      *See Care Plan and progress notes for long and short-term goals.     Barriers to Discharge  Current Status/Progress Possible Resolutions Date Resolved   Physician    Medical stability;Other (comments)  stairs  See above  Therapies, optimize DM/HTN meds, follow labs, new diabetic edu      Nursing  PT                    OT                  SLP                SW                Discharge Planning/Teaching Needs:  Girlfriend to come in for education next week, she will be providing 24 hr care to pt. Main issues is getting up lfight of stairs into apartment      Team Discussion:  Non - compliant with overall health management;  Increased tone - encourage wearing orthotics; newly diagnosed DM - referral to DM coordinator.  Cont b/b.  min b/d overall;  Impaired propriocep  on right side.  Minimal progress happening.  Balance deficits.  CG - mod assist with tfs and mobility - may need to downgrade some goals.  Revisions to Treatment Plan:  NA    Continued Need for Acute Rehabilitation Level of Care: The patient requires daily medical management by a physician with specialized training in physical medicine and rehabilitation for the following conditions: Daily direction of a multidisciplinary physical rehabilitation program to ensure safe treatment while eliciting the highest outcome that is of practical value to the patient.: Yes Daily medical management of patient stability for increased activity during participation in an intensive rehabilitation regime.: Yes Daily analysis of laboratory values and/or radiology reports with any subsequent need for medication adjustment of medical intervention for : Neurological problems;Blood pressure problems;Diabetes problems;Other   I attest that I was present, lead the team conference, and concur with the assessment and plan of the team.   Lennart Pall 03/13/2019, 3:34 PM    Team conference was held via web/ teleconference due to [place] - 19

## 2019-03-13 NOTE — Progress Notes (Signed)
Speech Language Pathology Daily Session Note  Patient Details  Name: Casey Petty MRN: [id number] Date of Birth: [birth year]  Today's Date: 03/13/2019 SLP Individual Time: 1000-1030 SLP Individual Time Calculation (min): 30 min  Short Term Goals: Week 2: SLP Short Term Goal 1 (Week 2): Patient will utilize speech intelligibility strategies at the phrase level with Min A verbal cues to achieve ~75% intelligibility. SLP Short Term Goal 2 (Week 2): Patient will demonstrate selective attention to functional tasks for 30 minutes with Mod A verbal cues. SLP Short Term Goal 3 (Week 2): Patient will demonstrate functional problem solving in semi-complex tasks with Min A verbal cues.  Skilled Therapeutic Interventions:  Skilled treatment session focused on congition goals. SLP faciltiated session by providing semi-complex medication management task. Pt was distracted during task but able to maintain attention to task. He required Min A cues to problem solve BID medicines. Although pt was impulsive throughout session he was accurate with problem solving QD and BID (after explanation of BID). Pt left upright in wheelchair with all needs within reach.        Pain    Therapy/Group: Individual Therapy  Casey Petty 03/13/2019, 1:48 PM

## 2019-03-13 NOTE — Progress Notes (Signed)
[facility] PHYSICAL MEDICINE & REHABILITATION PROGRESS NOTE   Subjective/Complaints: Patient seen sitting up in bed this morning.  He states he slept well overnight.  He is very impulsive.  Wants to go home.  Review of systems: Denies CP, SOB, N/V/D  Objective:   No results found. Recent Labs    03/11/19 0703  WBC 11.9*  HGB 15.5  HCT 45.7  PLT 241   Recent Labs    03/11/19 0703  NA 137  K 4.2  CL 102  CO2 26  GLUCOSE 198*  BUN 15  CREATININE 1.05  CALCIUM 9.8    Intake/Output Summary (Last 24 hours) at 03/13/2019 1353 Last data filed at 03/13/2019 0713 Gross per 24 hour  Intake 222 ml  Output 1150 ml  Net -928 ml     Physical Exam: Vital Signs Blood pressure (!) 167/94, pulse 75, temperature 98.2 F (36.8 C), resp. rate 18, height 5\' 7"  (1.702 m), weight 72.3 kg, SpO2 97 %. Constitutional: No distress . Vital signs reviewed. HENT: Normocephalic.  Atraumatic. Eyes: EOMI. No discharge. Cardiovascular: No JVD. Respiratory: Normal effort.  No stridor. GI: Non-distended. Skin: Warm and dry.  Intact. Psych: Normal mood.  Normal behavior. Musc: No edema.  No tenderness. Neurologic: Alert Motor: LUE/LLE: 5/5 RUE: Shoulder abduction 2-/5, elbow flexion/extension 2/5, handgrip 0/5, unchanged Right lower extremity: Hip flexion, knee extension 4/5, ankle dorsiflexion 0/5, unchanged Psych: Very impulsive.  Assessment/Plan: 1. 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  Physiatrist is providing close team supervision and 24 hour management of active medical problems listed below.  Physiatrist and rehab team continue to assess barriers to discharge/monitor patient progress toward functional and medical goals  Care Tool:  Bathing    Body parts bathed by patient: Right arm, Chest, Abdomen, Front perineal area, Buttocks, Right upper leg, Left upper  leg, Left lower leg, Face   Body parts bathed by helper: Left arm, Right lower leg     Bathing assist Assist Level: Minimal Assistance - Patient > 75%     Upper Body Dressing/Undressing Upper body dressing   What is the patient wearing?: Pull over shirt    Upper body assist Assist Level: Supervision/Verbal cueing    Lower Body Dressing/Undressing Lower body dressing      What is the patient wearing?: Underwear/pull up, Pants     Lower body assist Assist for lower body dressing: Minimal Assistance - Patient > 75%     Toileting Toileting    Toileting assist Assist for toileting: Moderate Assistance - Patient 50 - 74%     Transfers Chair/bed transfer  Transfers assist  Chair/bed transfer activity did not occur: Safety/medical concerns  Chair/bed transfer assist level: Moderate Assistance - Patient 50 - 74%     Locomotion Ambulation   Ambulation assist      Assist level: Moderate Assistance - Patient 50 - 74% Assistive device: Walker-rolling Max distance: 131ft   Walk 10 feet activity   Assist     Assist level: Moderate Assistance - Patient - 50 - 74% Assistive device: Walker-rolling   Walk 50 feet activity   Assist Walk 50 feet with 2 turns activity did not occur: Safety/medical concerns  Assist level: Moderate Assistance - Patient - 50 - 74% Assistive device: Walker-rolling    Walk 150 feet activity   Assist Walk 150 feet activity did not occur: Safety/medical concerns  Walk 10 feet on uneven surface  activity   Assist Walk 10 feet on uneven surfaces activity did not occur: Safety/medical concerns         Wheelchair     Assist Will patient use wheelchair at discharge?: Yes Type of Wheelchair: Manual    Wheelchair assist level: Supervision/Verbal cueing Max wheelchair distance: 150'    Wheelchair 50 feet with 2 turns activity    Assist        Assist Level: Supervision/Verbal cueing   Wheelchair 150  feet activity     Assist     Assist Level: Supervision/Verbal cueing    Medical Problem List and Plan: 1.Right hemiparesis and functional deficitssecondary to left subcortical lacunar infarct.   Continue CIR  Encourage compliance with orthosis again - pt refusing  Team conference today to discuss current and goals and coordination of care, home and environmental barriers, and discharge planning with nursing, case manager, and therapies.  2. Antithrombotics: -DVT/anticoagulation:Pharmaceutical:Lovenox -antiplatelet therapy: ASA/Plavix x3 weeks followed by ASA alone 3. Pain Management:N/A 4. Mood:LCSW to follow for evaluation and support -antipsychotic agents: N/A 5. Neuropsych: This patientiscapable of making decisions on hisown behalf.may need neuropsych for adjustment   May need behavior plan if pt is disruptive at [place] for impulsivity  Discussed with Neurosurg 6. Skin/Wound Care:Routine pressure re ief measures. Maintain adequate nutrition and hydration status 7. Fluids/Electrolytes/Nutrition:Monitor I/O.   8. HTN: Monitor BP bid. Continue Norvasc 10mg  daily ,Coreg 6.25mg  BID   Hydralazine 10 3 times daily started on 8/2, increased to 25 3 times daily on 8/3, increased again on 8/4, increased again on 8/5 Vitals:   03/12/19 2143 03/13/19 0448  BP: (!) 157/68 (!) 167/94  Pulse: 78 75  Resp:  18  Temp:  98.2 F (36.8 C)  SpO2:  97%   Elevated on 8/5 9. Dyslipidemia: On Lipitor.  10. Acute angioedema (face/lips): Resolved with d/c ACE and IV solumedrol.  Resumed regular texturefoods without issue. 11.Leucocytosis:    WBCs 11.9 on 8/3  Afebrile 12.  Constipation will adjust laxatives, have discussed need to call staff at the first sensation of needing to defecate , may need toileting program  13.  New diagnosis of diabetes mellitus with hyperglycemia  Hemoglobin A1c 7.9 on 8/4  CBGs elevated on 8/5  Diabetic  education  Diet changed to Carb modified - anticipate continued noncompliance  Continue to monitor  LOS: 9 days A FACE TO FACE EVALUATION WAS PERFORMED  Ankit Lorie Phenix 03/13/2019, 1:53 PM

## 2019-03-13 NOTE — Progress Notes (Signed)
Orthopedic Tech Progress Note Patient Details:  Casey Petty May 20, 1961 [id number]  Patient ID: Casey Petty, male   DOB: 07/05/1961, 58 y.o.   MRN: [id number]   Casey Petty 03/13/2019, 12:13 PMCalled [employer] for right AFO brace.

## 2019-03-14 ENCOUNTER — Inpatient Hospital Stay (HOSPITAL_COMMUNITY): Payer: Medicaid Other | Admitting: Occupational Therapy

## 2019-03-14 ENCOUNTER — Inpatient Hospital Stay (HOSPITAL_COMMUNITY): Payer: Medicaid Other | Admitting: Speech Pathology

## 2019-03-14 ENCOUNTER — Inpatient Hospital Stay (HOSPITAL_COMMUNITY): Payer: Medicaid Other | Admitting: Physical Therapy

## 2019-03-14 DIAGNOSIS — G8110 Spastic hemiplegia affecting unspecified side: Secondary | ICD-10-CM

## 2019-03-14 LAB — GLUCOSE, CAPILLARY
Glucose-Capillary: 151 mg/dL — ABNORMAL HIGH (ref 70–99)
Glucose-Capillary: 203 mg/dL — ABNORMAL HIGH (ref 70–99)
Glucose-Capillary: 143 mg/dL — ABNORMAL HIGH (ref 70–99)
Glucose-Capillary: 183 mg/dL — ABNORMAL HIGH (ref 70–99)

## 2019-03-14 NOTE — Progress Notes (Signed)
Occupational Therapy Session Note  Patient Details  Name: Casey Petty MRN: [id number] Date of Birth: [birth year]  Today's Date: 03/14/2019 OT Individual Time: 9323-5573 OT Individual Time Calculation (min): 30 min    Short Term Goals: Week 2:  OT Short Term Goal 1 (Week 2): Pt will complete UB bathing with supervision following hemi techniques. OT Short Term Goal 2 (Week 2): Pt will complete LB dressing sit to stand with supervision except for AFO on the left foot. OT Short Term Goal 3 (Week 2): Pt will complete LB bathing sit to stand with min guard assist. OT Short Term Goal 4 (Week 2): Pt completed toilet transfer with min guard assist using the RW for support.  Skilled Therapeutic Interventions/Progress Updates:    Pt worked on tubshower transfers with use of the tub bench for support.  He was able to complete transfer into the shower to the left side with min assist squat pivot.  For the transfer out of the shower to the right, he needed mod assist secondary to LOB to the left secondary to not moving his RLE and shifting his weight to the right too fast.  Discussed need for tub bench at home which he is in agreement with as well as 3:1.  Next had pt work on Clinical research associate with the RUE for 6 mins through use of the UE ergonometer.  Therapist used ace bandage to help sustain grip on the right peddle as well as providing mod facilitation to complete revolutions for two, 3 minute intervals.  He needed mod assist as well to avoid overcompensation with shoulder hike, trunk flexion, and left lean.  Finished session with return to the room and pt completing wheelchair mobility primarily using the LLE only.  Pt left in the wheelchair with call button and phone in reach and safety belt in place.    Therapy Documentation Precautions:  Precautions Precautions: Fall Precaution Comments: right UE and LE hemiparesis Restrictions Weight Bearing Restrictions: No  Pain: Pain  Assessment Pain Scale: Faces Pain Score: 0-No pain  Therapy/Group: Individual Therapy  Paulena Servais OTR/L 03/14/2019, 3:43 PM

## 2019-03-14 NOTE — Plan of Care (Signed)
Nutrition Education Note  RD consulted for nutrition education regarding diabetes. Pt with new diagnosis of diabetes.  RD reviewed Diabetes Coordinator note from 8/04. Diabetes Coordinator discussed hemoglobin A1C and pt's diet and exercise history.  Lab Results  Component Value Date   HGBA1C 7.9 (H) 03/11/2019   Spoke with pt at bedside. Pt reports that he did not know that he has diabetes. RD referred to Diabetes Coordinator visit on 8/04 and pt states, "yeah, maybe someone did come and talk to me." Pt is unable to recall anything that was discussed with Diabetes Coordinator.  Pt reports that PTA, he was eating 3 meals a day. Pt states that his daughter is a vegetarian and that he himself does not eat a lot of meat. Pt reports that a typical breakfast for him includes cereal with milk and that a typical lunch or dinner includes rice and beans. Pt reports that he drinks water, Coke, and apple juice. RD noted candy bag at bedside.  RD provided "Carbohydrate Counting for People with Diabetes" handout from the Academy of [employer] and Dietetics. Discussed different food groups and their effects on blood sugar, emphasizing carbohydrate-containing foods. Provided list of carbohydrates and recommended serving sizes of common foods.  Discussed importance of controlled and consistent carbohydrate intake throughout the day. Provided examples of ways to balance meals/snacks and encouraged intake of high-fiber, whole grain complex carbohydrates. Teach back method used.  Expect poor to fair compliance.  Body mass index is 24.98 kg/m. Pt meets criteria for normal weight based on current BMI.  Current diet order is Carb Modified, patient is consuming approximately 100% of meals at this time. Labs and medications reviewed. No further nutrition interventions warranted at this time. RD contact information provided. If additional nutrition issues arise, please re-consult RD.   Gaynell Face, MS, RD,  LDN Inpatient Clinical Dietitian Pager: [phone] Weekend/After Hours: [phone]

## 2019-03-14 NOTE — Progress Notes (Signed)
Physical Therapy Session Note  Patient Details  Name: Casey Petty MRN: [id number] Date of Birth: [birth year]  Today's Date: 03/14/2019 PT Individual Time: 2500-3704 PT Individual Time Calculation (min): 60 min   Short Term Goals: Week 2:  PT Short Term Goal 1 (Week 2): = to LTGs based on ELOS  Skilled Therapeutic Interventions/Progress Updates:    Pt received sitting in w/c and agreeable to therapy session. Transport to gym in w/c for time management. Orthotist, Meghan, present for consult regarding proper R LE AFO and toe cap on shoe. Sit<>stands using RW with CGA for steadying throughout session. Pt ambulated ~51ft using RW while wearing R posterior leaf spring AFO with min assist for balance and mod cuing for AD management and R LE foot clearance/placement during/after swing phase. Pt educated on selection of the same posterior leaf spring AFO he is wearing, but making it taller for improved fit and needing shoe toe cap for improved swing phase gait mechanics. Stand pivot EOM>w/c using RW with CGA/min assist for balance and cuing for R LE stepping while turning. Performed L hemitechnique w/c propulsion ~162ft to day room with supervision.  Donned litegait harness while standing with RW and CGA for steadying. Stepped up onto treadmill using handrails with min assist for balance and max cuing for sequencing.  Used litegait body weight support system and treadmill for R LE NMR retraining for proper timing and sequencing of motor recruitment/muscle activation for gait mehcanics all with L UE support on treadmill bar: - 1st trial: 38min 26 seconds at 0.77mph for 125ft with mod assist for R LE swing phase clearance; multimodal cuing for increased R LE foot clearance during swing and increased R lateral weight shift and hip/knee extension during stance - 2nd trial: 60min7seconds at 0.81mph for 165ft with max assist/manual facilitation initially to increase R LE hip/knee flexion during swing phase for  improved foot clearance pt demonstrates improving hip flexion activation - 3rd trial: 2min2seconds at 0.31mph for 222ft with varying max-min assist/manual facilitation for R LE swing phase to increase hip/knee flexion - pt demonstrates improving hip flexion but still minimal to no knee flexion during swing phase due to lack of motor recruitment While in harness attempted R LE step up/down on 4" step but pt continues to recruit quad muscle with lack of hamstring recruitment despite max multimodal cuing. Doffed harness while standing with L UE support and CGA for steadying. Stepped backwards off treadmill with L UE support on treadmill rail and min assist for balance - mod/max cuing for sequencing.  Performed ~74ft R LE w/c propulsion focusing on NMR to retrain muscle activation/coordinatoin for reversing between quadriceps and hamstring muscle activation (switching back and forth between extension and flexion) - required min assist for lifting R LE into hip flexion - pt demonstrates improving hamstring muscle activation this session. Performed L LE w/c propulsion remainder of distance back to room. Pt left sitting in w/c with needs in reach and seat belt alarm on.  Therapy Documentation Precautions:  Precautions Precautions: Fall Precaution Comments: right UE and LE hemiparesis Restrictions Weight Bearing Restrictions: No  Pain:   Denies pain during session.   Therapy/Group: Individual Therapy  Tawana Scale, PT, DPT 03/14/2019, 8:11 AM

## 2019-03-14 NOTE — Progress Notes (Signed)
Social Work Patient ID: Casey Petty, male   DOB: [birth year], 58 y.o.   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  A walker  will not resolve issue with performing activities of daily living.  A wheelchair will allow patient to safely perform daily activities.  Patient is not able to propel themselves in the home using a standard weight wheelchair due to endurance and fatigue .  Patient can self propel in the lightweight wheelchair.

## 2019-03-14 NOTE — Progress Notes (Signed)
Speech Language Pathology Daily Session Note  Patient Details  Name: Casey Petty MRN: [id number] Date of Birth: [birth year]  Today's Date: 03/14/2019 SLP Individual Time: 1000-1100 SLP Individual Time Calculation (min): 60 min  Short Term Goals: Week 2: SLP Short Term Goal 1 (Week 2): Patient will utilize speech intelligibility strategies at the phrase level with Min A verbal cues to achieve ~75% intelligibility. SLP Short Term Goal 2 (Week 2): Patient will demonstrate selective attention to functional tasks for 30 minutes with Mod A verbal cues. SLP Short Term Goal 3 (Week 2): Patient will demonstrate functional problem solving in semi-complex tasks with Min A verbal cues.  Skilled Therapeutic Interventions:  Skilled treatment session focused on cognition goals. SLP facilitated session by providing Mod A cues for impulsivity and problem solving during game of checkers. In addition for cues to "pre-plan" pt's turn, he required Mod A cues for selective attention, strategy and overall insight. Pt is very friendly but is also very dismissive of deficits/cues for correction. Pt left upright in wheelchair, lap belt alarm on and all needs within reach. Continue per current plan of care.      Pain    Therapy/Group: Individual Therapy  Jacqulynn Shappell 03/14/2019, 11:39 AM

## 2019-03-14 NOTE — Progress Notes (Signed)
Occupational Therapy Session Note  Patient Details  Name: Casey Petty MRN: [id number] Date of Birth: [birth year]  Today's Date: 03/14/2019 OT Individual Time: 0905-1000 OT Individual Time Calculation (min): 55 min    Short Term Goals: Week 2:  OT Short Term Goal 1 (Week 2): Pt will complete UB bathing with supervision following hemi techniques. OT Short Term Goal 2 (Week 2): Pt will complete LB dressing sit to stand with supervision except for AFO on the left foot. OT Short Term Goal 3 (Week 2): Pt will complete LB bathing sit to stand with min guard assist. OT Short Term Goal 4 (Week 2): Pt completed toilet transfer with min guard assist using the RW for support.  Skilled Therapeutic Interventions/Progress Updates:    Pt completed bathing and dressing sit to stand from the shower.  Min assist for functional mobility to the shower with hand held assist.  He was able to complete all bathing with min assist for balance sit to stand.  Educated pt on hemi dressing technique for washing the LUE.  He was able to transfer out  to the EOB for dressing.  Supervision for donning pullover but he did not listen and follow hemi dressing technique.  Mod instructional cueing with min assist for all LB dressing including donning TLSO on the RLE.  Finished session with transfer back to the wheelchair with min assist.  Worked on [employer] exercises with emphasis on shoulder flexion with external rotation.  Mod assist to complete multiple repetitions.  Finished session with call button and phone in reach with safety belt in place.    Therapy Documentation Precautions:  Precautions Precautions: Fall Precaution Comments: right UE and LE hemiparesis Restrictions Weight Bearing Restrictions: No  Pain: Pain Assessment Pain Scale: Faces Pain Score: 0-No pain ADL: See Care Tool Section for some details of ADL  Therapy/Group: Individual Therapy  Teryl Mcconaghy OTR/L 03/14/2019, 12:43 PM

## 2019-03-14 NOTE — Progress Notes (Addendum)
Had pt to start doing finger sticks himself at lunch today.  Pt had much difficulty with it due to him being right handed and right side was affected related to L-CVA.  Teaching will need to be done with wife and continue throughout remainder of stay.

## 2019-03-14 NOTE — Progress Notes (Signed)
[place] PHYSICAL MEDICINE & REHABILITATION PROGRESS NOTE   Subjective/Complaints: Patient seen laying in bed this morning.  He states he slept well overnight.  Synergistic patterns noted, patient excited when informed his toes moved.  He asks when he will be discharged.  Review of systems: Denies CP, SOB, N/V/D  Objective:   No results found. No results for input(s): WBC, HGB, HCT, PLT in the last 72 hours. No results for input(s): NA, K, CL, CO2, GLUCOSE, BUN, CREATININE, CALCIUM in the last 72 hours.  Intake/Output Summary (Last 24 hours) at 03/14/2019 1538 Last data filed at 03/14/2019 1333 Gross per 24 hour  Intake 640 ml  Output 800 ml  Net -160 ml     Physical Exam: Vital Signs Blood pressure 133/73, pulse 87, temperature 98.3 F (36.8 C), resp. rate 18, height 5\' 7"  (1.702 m), weight 72.3 kg, SpO2 100 %. Constitutional: No distress . Vital signs reviewed. HENT: Normocephalic.  Atraumatic. Eyes: EOMI. No discharge. Cardiovascular: No JVD. Respiratory: Normal effort.  No stridor. GI: Non-distended. Skin: Warm and dry.  Intact. Psych: Normal mood.  Normal behavior. Musc: No edema.  No tenderness. Neurologic: Alert Motor: LUE/LLE: 5/5 RUE: Shoulder abduction 2-/5, elbow flexion/extension 2/5, handgrip 0/5, stable Right lower extremity: Hip flexion, knee extension 4/5, ankle dorsiflexion 0/5, stable, synergistic patterns noted Increase in tone noted Psych: Impulsive  Assessment/Plan: 1. 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  Physiatrist is providing close team supervision and 24 hour management of active medical problems listed below.  Physiatrist and rehab team continue to assess barriers to discharge/monitor patient progress toward functional and medical goals  Care Tool:  Bathing    Body parts bathed by patient: Right arm, Chest,  Abdomen, Front perineal area, Buttocks, Right upper leg, Left upper leg, Left lower leg, Face, Left arm, Right lower leg   Body parts bathed by helper: Left arm, Right lower leg     Bathing assist Assist Level: Minimal Assistance - Patient > 75%     Upper Body Dressing/Undressing Upper body dressing   What is the patient wearing?: Pull over shirt    Upper body assist Assist Level: Supervision/Verbal cueing    Lower Body Dressing/Undressing Lower body dressing      What is the patient wearing?: Underwear/pull up, Pants     Lower body assist Assist for lower body dressing: Minimal Assistance - Patient > 75%     Toileting Toileting    Toileting assist Assist for toileting: Moderate Assistance - Patient 50 - 74%     Transfers Chair/bed transfer  Transfers assist  Chair/bed transfer activity did not occur: Safety/medical concerns  Chair/bed transfer assist level: Minimal Assistance - Patient > 75%     Locomotion Ambulation   Ambulation assist      Assist level: Moderate Assistance - Patient 50 - 74% Assistive device: Walker-rolling Max distance: 135ft   Walk 10 feet activity   Assist     Assist level: Moderate Assistance - Patient - 50 - 74% Assistive device: Walker-rolling   Walk 50 feet activity   Assist Walk 50 feet with 2 turns activity did not occur: Safety/medical concerns  Assist level: Moderate Assistance - Patient - 50 - 74% Assistive device: Walker-rolling    Walk 150 feet activity   Assist Walk 150 feet activity did not occur: Safety/medical concerns         Walk 10 feet on  uneven surface  activity   Assist Walk 10 feet on uneven surfaces activity did not occur: Safety/medical concerns         Wheelchair     Assist Will patient use wheelchair at discharge?: Yes Type of Wheelchair: Manual    Wheelchair assist level: Supervision/Verbal cueing Max wheelchair distance: 50    Wheelchair 50 feet with 2 turns  activity    Assist        Assist Level: Supervision/Verbal cueing   Wheelchair 150 feet activity     Assist     Assist Level: Supervision/Verbal cueing    Medical Problem List and Plan: 1.Right hemiparesis, now with spasticity and functional deficitssecondary to left subcortical lacunar infarct.   Continue CIR  Encourage compliance with orthosis again - pt refusing 2. Antithrombotics: -DVT/anticoagulation:Pharmaceutical:Lovenox -antiplatelet therapy: ASA/Plavix x3 weeks followed by ASA alone 3. Pain Management:N/A 4. Mood:LCSW to follow for evaluation and support -antipsychotic agents: N/A 5. Neuropsych: This patientiscapable of making decisions on hisown behalf.may need neuropsych for adjustment   May need behavior plan if pt is disruptive at [facility] for impulsivity  Discussed with Neurosurg 6. Skin/Wound Care:Routine pressure re ief measures. Maintain adequate nutrition and hydration status 7. Fluids/Electrolytes/Nutrition:Monitor I/O.   8. HTN: Monitor BP bid. Continue Norvasc 10mg  daily ,Coreg 6.25mg  BID   Hydralazine 10 3 times daily started on 8/2, increased to 25 3 times daily on 8/3, increased again on 8/4, increased again on 8/5 Vitals:   03/14/19 0605 03/14/19 1345  BP: (!) 164/88 133/73  Pulse: 74 87  Resp: 16 18  Temp: 98.4 F (36.9 C) 98.3 F (36.8 C)  SpO2: 99% 100%   Labile on 8/6 9. Dyslipidemia: On Lipitor.  10. Acute angioedema (face/lips): Resolved with d/c ACE and IV solumedrol.  Resumed regular texturefoods without issue. 11.Leucocytosis:    WBCs 9.9 on 8/30  Afebrile 12.  Constipation: Continue to monitor 13.  New diagnosis of diabetes mellitus with hyperglycemia  Hemoglobin A1c 7.9 on 8/4  Diabetic education  Diet changed to Carb modified - anticipate continued noncompliance  Labile on 8/6  Continue to monitor  LOS: 10 days A FACE TO FACE EVALUATION WAS  PERFORMED  Ankit Lorie Phenix 03/14/2019, 3:38 PM

## 2019-03-15 ENCOUNTER — Inpatient Hospital Stay (HOSPITAL_COMMUNITY): Payer: Medicaid Other | Admitting: Occupational Therapy

## 2019-03-15 ENCOUNTER — Inpatient Hospital Stay (HOSPITAL_COMMUNITY): Payer: Medicaid Other | Admitting: Speech Pathology

## 2019-03-15 ENCOUNTER — Inpatient Hospital Stay (HOSPITAL_COMMUNITY): Payer: Medicaid Other | Admitting: Physical Therapy

## 2019-03-15 DIAGNOSIS — R7309 Other abnormal glucose: Secondary | ICD-10-CM

## 2019-03-15 DIAGNOSIS — Z9119 Patient's noncompliance with other medical treatment and regimen: Secondary | ICD-10-CM

## 2019-03-15 DIAGNOSIS — R0989 Other specified symptoms and signs involving the circulatory and respiratory systems: Secondary | ICD-10-CM

## 2019-03-15 DIAGNOSIS — G8191 Hemiplegia, unspecified affecting right dominant side: Secondary | ICD-10-CM

## 2019-03-15 LAB — GLUCOSE, CAPILLARY
Glucose-Capillary: 217 mg/dL — ABNORMAL HIGH (ref 70–99)
Glucose-Capillary: 171 mg/dL — ABNORMAL HIGH (ref 70–99)
Glucose-Capillary: 156 mg/dL — ABNORMAL HIGH (ref 70–99)
Glucose-Capillary: 170 mg/dL — ABNORMAL HIGH (ref 70–99)

## 2019-03-15 MED ORDER — BACLOFEN 5 MG HALF TABLET
5.0000 mg | ORAL_TABLET | Freq: Two times a day (BID) | ORAL | Status: DC
  Administered 2019-03-15 – 2019-03-16 (×2): 5 mg via ORAL
  Filled 2019-03-15 (×3): qty 1

## 2019-03-15 MED ORDER — HYDRALAZINE HCL 50 MG PO TABS
100.0000 mg | ORAL_TABLET | Freq: Three times a day (TID) | ORAL | Status: DC
  Administered 2019-03-15 – 2019-03-20 (×15): 100 mg via ORAL
  Filled 2019-03-15 (×15): qty 2

## 2019-03-15 MED ORDER — METFORMIN HCL 850 MG PO TABS
850.0000 mg | ORAL_TABLET | Freq: Two times a day (BID) | ORAL | Status: DC
  Administered 2019-03-15 – 2019-03-20 (×10): 850 mg via ORAL
  Filled 2019-03-15 (×10): qty 1

## 2019-03-15 NOTE — Progress Notes (Signed)
Called Diabetes Coordinator, they will f/u with significant other to introduce glucometer and process. Pt is checking CBGs with our glucometer and feels that he is doing excellent.  However, pt struggles with checking his CBGs and feel that due to deficients would be better to have Casey Petty (significant other) learn process and info regarding education for reinforcement. Will have Shannon f/u on Monday and make contact with [facility].

## 2019-03-15 NOTE — Progress Notes (Signed)
Occupational Therapy Session Note  Patient Details  Name: Casey Petty MRN: [id number] Date of Birth: [birth year]  Today's Date: 03/15/2019 OT Individual Time: 9794-8016 OT Individual Time Calculation (min): 44 min    Short Term Goals: Week 2:  OT Short Term Goal 1 (Week 2): Pt will complete UB bathing with supervision following hemi techniques. OT Short Term Goal 2 (Week 2): Pt will complete LB dressing sit to stand with supervision except for AFO on the left foot. OT Short Term Goal 3 (Week 2): Pt will complete LB bathing sit to stand with min guard assist. OT Short Term Goal 4 (Week 2): Pt completed toilet transfer with min guard assist using the RW for support.  Skilled Therapeutic Interventions/Progress Updates:    Pt donned shoes and socks at the EOB with mod assist.  He then completed transfer to the wheelchair with min assist and was transported down to the therapy gym.  He was able to complete transfer to the therapy mat at the same level with transition to supine with supervision.  Therapist performed stretching to the right shoulder internal rotators and elbow flexors with RUE positioned at 90 degrees abduction.  Pt completed rolling on and off the the RUE for weightbearing with a reduction in biceps and pectoral tone as well as a decrease in recent report of increased anterior shoulder pain.  Therapist applied a foam yoga block and taped it to his right hand in order to maintain digit extension for weightbearing.  Had pt transition to sitting and work on sit to squat position and hold with RUE in weightbearing on surface and foam block.  Emphasized reaching across with the RUE as well as down and across in order to increase weightbearing through the RUE and RLE.  Min assist to maintain balance in squat position as well as for maintaining position with the RUE.  Finished session with return to the room and pt being left up in the wheelchair with call button and phone in reach with safety  belt in place.    Therapy Documentation Precautions:  Precautions Precautions: Fall Precaution Comments: right UE and LE hemiparesis Restrictions Weight Bearing Restrictions: No  Pain: Pain Assessment Pain Scale: Faces Pain Score: 0-No pain  Therapy/Group: Individual Therapy  Casey Petty OTR/L 03/15/2019, 4:14 PM

## 2019-03-15 NOTE — Progress Notes (Signed)
Physical Therapy Session Note  Patient Details  Name: Casey Petty MRN: [id number] Date of Birth: 1960-09-08  Today's Date: 03/15/2019 PT Individual Time: 8921-1941 PT Individual Time Calculation (min): 59 min   Short Term Goals: Week 1:  PT Short Term Goal 1 (Week 1): Pt will perform bed<>chair transfer with min assist PT Short Term Goal 1 - Progress (Week 1): Met PT Short Term Goal 2 (Week 1): Pt will ambulated x 100 ft with LRAD and min assist PT Short Term Goal 2 - Progress (Week 1): Met PT Short Term Goal 3 (Week 1): Pt will participate in berg balance test for assessment of balance deficits PT Short Term Goal 3 - Progress (Week 1): Met  Skilled Therapeutic Interventions/Progress Updates:    Pt received sitting in w/c and agreeable to therapy session. Performed L hemi-technique w/c propulsion ~192f using primarily LE with supervision. Performed ~355fR LE w/c propulsion focusing on NMR to alternate quad muscle and hamstring muscle activation for improved motor planning/control. Sit<>stands using RW with CGA for steadying during session - with increased time pt able to use L hand to place and strap R hand on orthotic. Ambulated ~9040fsing RW with CGA/min assist for balance as pt continues to demonstrate poor R LE foot clearance during swing phase - pt continues to demonstrate over activation of quadriceps muscle and inability to activate hip flexors and hamstrings during swing phase. Ascended/descended 8 (6" height) steps using L handrail only with CGA on ascent and pt demonstrating proper step-to pattern then min assist for balance on descent as pt unable to adequately lift R LE to bring it down due to inability to activate hip/knee flexors. Ambulated ~67f6fing RW with min assist for balance while attempting to step over hurdles to force hip/knee flexor muscle activation in swing but pt unable to bring leg over hurdle without physical assistance. Ambulated ~30ft24fng RW with CGA/min  assist for balance and continued cuing for improved R LE swing phase mechanics. Sit>supine>prone with min assist for R UE positioning. Performed prone R LE hamstring curls 2x15reps with pt demonstrating improving muscle recruitment and motor control with pt able to perform knee flexion to 80degrees without physical assistance. L sidelying R LE NMR using powder board with aim at activating hamstring muscles in sidelying but despite max multimodal cuing and trialing various R LE positions pt unable to perform knee flexion or even activate hamstring muscle. Prone>sitting with assist for R LE management. Attempted standing with B UE support on RW and performing isolated knee flexion towards external target but pt again unable to activate hamstring muscle despite max multimodal cuing. Ambulated ~25ft 41fg RW with CGA/min assist for balance and cuing for improved R LE foot placement after swing phase. Transported back to room in w/c for time management. Stand pivot w/c>EOB, no AD, with min assist for balance. Doffed B shoes and R LE AFO max assist. Sit>supine with supervision. Pt left supine in bed with needs in reach and bed alarm on.  Therapy Documentation Precautions:  Precautions Precautions: Fall Precaution Comments: right UE and LE hemiparesis Restrictions Weight Bearing Restrictions: No  Pain:   Denies pain during session.   Therapy/Group: Individual Therapy  Shaela Boer Tawana ScaleDPT 03/15/2019, 3:02 PM

## 2019-03-15 NOTE — Progress Notes (Signed)
[place] PHYSICAL MEDICINE & REHABILITATION PROGRESS NOTE   Subjective/Complaints: Patient seen laying in bed working with therapies this morning.  He states he slept well overnight.  He confabulates, discussed with therapies.  Review of systems: Denies CP, SOB, N/V/D  Objective:   No results found. No results for input(s): WBC, HGB, HCT, PLT in the last 72 hours. No results for input(s): NA, K, CL, CO2, GLUCOSE, BUN, CREATININE, CALCIUM in the last 72 hours.  Intake/Output Summary (Last 24 hours) at 03/15/2019 1233 Last data filed at 03/15/2019 0900 Gross per 24 hour  Intake 662 ml  Output 700 ml  Net -38 ml     Physical Exam: Vital Signs Blood pressure (!) 170/93, pulse 85, temperature 97.6 F (36.4 C), temperature source Oral, resp. rate 18, height 5\' 7"  (1.702 m), weight 72.3 kg, SpO2 98 %. Constitutional: NAD. Vital signs reviewed. HENT: Normocephalic.  Atraumatic. Eyes: EOMI. No discharge. Cardiovascular: No JVD. Respiratory: Normal effort.  No stridor. GI: Non--distended. Skin: Warm and dry.  Intact. Psych: Impulsive Musc: No edema.  No tenderness. Neurologic: Alert Motor: LUE/LLE: Grossly 5/5 RUE: Shoulder abduction 2-/5, elbow flexion/extension 2/5, handgrip 0/5, unchanged Right lower extremity: Hip flexion, knee extension 4/5, ankle dorsiflexion 0/5, stable, synergistic patterns noted, unchanged Increase in tone noted  Assessment/Plan: 1. 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  Physiatrist is providing close team supervision and 24 hour management of active medical problems listed below.  Physiatrist and rehab team continue to assess barriers to discharge/monitor patient progress toward functional and medical goals  Care Tool:  Bathing    Body parts bathed by patient: Right arm, Chest, Abdomen, Front perineal area, Buttocks, Right  upper leg, Left upper leg, Left lower leg, Face, Left arm, Right lower leg   Body parts bathed by helper: Left arm, Right lower leg     Bathing assist Assist Level: Minimal Assistance - Patient > 75%     Upper Body Dressing/Undressing Upper body dressing   What is the patient wearing?: Pull over shirt    Upper body assist Assist Level: Supervision/Verbal cueing    Lower Body Dressing/Undressing Lower body dressing      What is the patient wearing?: Underwear/pull up, Pants     Lower body assist Assist for lower body dressing: Minimal Assistance - Patient > 75%     Toileting Toileting    Toileting assist Assist for toileting: Moderate Assistance - Patient 50 - 74%     Transfers Chair/bed transfer  Transfers assist  Chair/bed transfer activity did not occur: Safety/medical concerns  Chair/bed transfer assist level: Minimal Assistance - Patient > 75%     Locomotion Ambulation   Ambulation assist      Assist level: Moderate Assistance - Patient 50 - 74% Assistive device: Lite Gait Max distance: 256ft   Walk 10 feet activity   Assist     Assist level: Moderate Assistance - Patient - 50 - 74% Assistive device: Lite Gait   Walk 50 feet activity   Assist Walk 50 feet with 2 turns activity did not occur: Safety/medical concerns  Assist level: Moderate Assistance - Patient - 50 - 74% Assistive device: Lite Gait    Walk 150 feet activity   Assist Walk 150 feet activity did not occur: Safety/medical concerns  Assist level: Moderate Assistance - Patient - 50 - 74% Assistive device: Lite Gait    Walk 10 feet  on uneven surface  activity   Assist Walk 10 feet on uneven surfaces activity did not occur: Safety/medical concerns         Wheelchair     Assist Will patient use wheelchair at discharge?: Yes Type of Wheelchair: Manual    Wheelchair assist level: Supervision/Verbal cueing Max wheelchair distance: 148ft    Wheelchair 50 feet  with 2 turns activity    Assist        Assist Level: Supervision/Verbal cueing   Wheelchair 150 feet activity     Assist     Assist Level: Supervision/Verbal cueing    Medical Problem List and Plan: 1.Right hemiparesis, now with spasticity and functional deficitssecondary to left subcortical lacunar infarct.   Continue CIR  Encourage compliance with orthosis again - pt refusing  Baclofen 5 twice daily started on 8/7 2. Antithrombotics: -DVT/anticoagulation:Pharmaceutical:Lovenox -antiplatelet therapy: ASA/Plavix x3 weeks followed by ASA alone 3. Pain Management:N/A 4. Mood:LCSW to follow for evaluation and support -antipsychotic agents: N/A 5. Neuropsych: This patientiscapable of making decisions on hisown behalf.may need neuropsych for adjustment   May need behavior plan if pt is disruptive at noc    Continue tele-sitter for impulsivity  Discussed with neuropsych previously 6. Skin/Wound Care:Routine pressure re ief measures. Maintain adequate nutrition and hydration status 7. Fluids/Electrolytes/Nutrition:Monitor I/O.   8. HTN: Monitor BP bid. Continue Norvasc 10mg  daily ,Coreg 6.25mg  BID   Hydralazine 10 3 times daily started on 8/2, increased to 25 3 times daily on 8/3, increased again on 8/4, increased again on 8/5, increased on 8/7 Vitals:   03/14/19 1945 03/15/19 0544  BP: (!) 166/93 (!) 170/93  Pulse: 83 85  Resp: 19 18  Temp: 98.1 F (36.7 C) 97.6 F (36.4 C)  SpO2: 99% 98%   Labile, but elevated on 8/7 9. Dyslipidemia: On Lipitor.  10. Acute angioedema (face/lips): Resolved with d/c ACE and IV solumedrol.  Resumed regular texturefoods without issue. 11.Leucocytosis:    WBCs 11.9 on 8/3, labs ordered for Monday  Afebrile 12.  Constipation: Continue to monitor 13.  New diagnosis of diabetes mellitus with hyperglycemia  Hemoglobin A1c 7.9 on 8/4  Diabetic education  Diet changed to Carb modified -  anticipate continued noncompliance  Metformin increased to 50 twice daily on 8/7  Labile, but elevated on 8/7  Continue to monitor  LOS: 11 days A FACE TO FACE EVALUATION WAS PERFORMED  Ankit Lorie Phenix 03/15/2019, 12:33 PM

## 2019-03-15 NOTE — Progress Notes (Signed)
Speech Language Pathology Daily Session Note  Patient Details  Name: Casey Petty MRN: [id number] Date of Birth: [birth year]  Today's Date: 03/15/2019 SLP Individual Time: 4268-3419 SLP Individual Time Calculation (min): 45 min  Short Term Goals: Week 2: SLP Short Term Goal 1 (Week 2): Patient will utilize speech intelligibility strategies at the phrase level with Min A verbal cues to achieve ~75% intelligibility. SLP Short Term Goal 2 (Week 2): Patient will demonstrate selective attention to functional tasks for 30 minutes with Mod A verbal cues. SLP Short Term Goal 3 (Week 2): Patient will demonstrate functional problem solving in semi-complex tasks with Min A verbal cues.  Skilled Therapeutic Interventions:  Skilled treatment session focused on cognition goals. SLP recieved pt side-laying in bed self-feeding breakfast. Education provided to patient on general aspiration precautions. Pt largely dismissive of suggestions. SLP further facilitated session by providing simple mental math problems targeting basic cash interactions. Pt able to sequence through and use paper and pencil to solve. Pt utilizes money orders for bill pay and he was able to sequence through how to place money order as well as extra information surrounding which businesses are better for money orders etc. suspect that pt is approaching baseline cognitive abilities. Pt left side laying in bed, bed alarm on and all needs within reach, tele-sitter present. Continue per current plan of care.        Pain    Therapy/Group: Individual Therapy  Anyssa Sharpless 03/15/2019, 11:29 AM

## 2019-03-15 NOTE — Progress Notes (Signed)
Occupational Therapy Session Note  Patient Details  Name: Casey Petty MRN: [id number] Date of Birth: [birth year]  Today's Date: 03/15/2019 OT Individual Time: 0910-1003 OT Individual Time Calculation (min): 53 min    Short Term Goals: Week 2:  OT Short Term Goal 1 (Week 2): Pt will complete UB bathing with supervision following hemi techniques. OT Short Term Goal 2 (Week 2): Pt will complete LB dressing sit to stand with supervision except for AFO on the left foot. OT Short Term Goal 3 (Week 2): Pt will complete LB bathing sit to stand with min guard assist. OT Short Term Goal 4 (Week 2): Pt completed toilet transfer with min guard assist using the RW for support.  Skilled Therapeutic Interventions/Progress Updates:    Pt completed transfer from supine to sit with supervision and then squat pivot to the wheelchair on the left side with min assist as well.  He was then able to transfer to the therapy gym with min assist to the left side as well.  In sitting, therapist completed scapular mobilization to the right shoulder with adduction and downward rotation.  Then had pt transition to supine for stretching of pectoral in adduction and external rotation.  Noted increased biceps tone as well.  Therapist applied NMES to the right digit extensors while in this position and during activation had pt work on active elbow extension with abduction and external rotation to target.  He was able to activate extension against the biceps tone during simulated reach.  Increased thumb flexor tone also noted.  He was able to tolerate 16 min of stimulation with mod assist for guidance to reach out and overhead in supine.  Intensity ranging from 17-20 with PPS at 35 and pulse width at 300.  No adverse reactions to stimulation in both supine and sitting.  However in sitting, when pt is instructed to perform simple elbow extension with or without stimulation, he reports increased pain in the anterior shoulder and  tone/spasms increase.  Finished session with pt in the wheelchair and back in his room.  Call button and phone in reach with half lap tray supporting the RUE and safety alarm belt in place.    Therapy Documentation Precautions:  Precautions Precautions: Fall Precaution Comments: right UE and LE hemiparesis Restrictions Weight Bearing Restrictions: No  Pain:  No report of pain stated   Therapy/Group: Individual Therapy  Daschel Roughton OTR/L 03/15/2019, 12:11 PM

## 2019-03-16 LAB — GLUCOSE, CAPILLARY
Glucose-Capillary: 165 mg/dL — ABNORMAL HIGH (ref 70–99)
Glucose-Capillary: 117 mg/dL — ABNORMAL HIGH (ref 70–99)
Glucose-Capillary: 188 mg/dL — ABNORMAL HIGH (ref 70–99)
Glucose-Capillary: 208 mg/dL — ABNORMAL HIGH (ref 70–99)

## 2019-03-16 MED ORDER — BACLOFEN 5 MG HALF TABLET
5.0000 mg | ORAL_TABLET | Freq: Three times a day (TID) | ORAL | Status: DC
  Administered 2019-03-16 – 2019-03-20 (×12): 5 mg via ORAL
  Filled 2019-03-16 (×12): qty 1

## 2019-03-16 NOTE — Progress Notes (Signed)
[place] PHYSICAL MEDICINE & REHABILITATION PROGRESS NOTE   Subjective/Complaints: Lying in bed. No new issues today. Says he's anxious to get home. Wants to continue working on rehab onhis own!  ROS: Patient denies fever, rash, sore throat, blurred vision, nausea, vomiting, diarrhea, cough, shortness of breath or chest pain, joint or back pain, headache, or mood change.   Objective:   No results found. No results for input(s): WBC, HGB, HCT, PLT in the last 72 hours. No results for input(s): NA, K, CL, CO2, GLUCOSE, BUN, CREATININE, CALCIUM in the last 72 hours.  Intake/Output Summary (Last 24 hours) at 03/16/2019 1106 Last data filed at 03/16/2019 0920 Gross per 24 hour  Intake 360 ml  Output 1175 ml  Net -815 ml     Physical Exam: Vital Signs Blood pressure (!) 164/95, pulse 82, temperature 97.6 F (36.4 C), temperature source Oral, resp. rate 18, height 5\' 7"  (1.702 m), weight 72.3 kg, SpO2 93 %. Constitutional: No distress . Vital signs reviewed. HEENT: EOMI, oral membranes moist Neck: supple Cardiovascular: RRR without murmur. No JVD    Respiratory: CTA Bilaterally without wheezes or rales. Normal effort    GI: BS +, non-tender, non-distended  Psych: Impulsive Musc: No edema.  No tenderness. Neurologic: Alert. Speech dysarthric Motor: LUE/LLE: Grossly 5/5 RUE: Shoulder abduction 2-/5, elbow flexion/extension 2/5, handgrip 0/5, no change Right lower extremity: Hip flexion, knee extension 4/5, ankle dorsiflexion 0/5, stable, synergistic patterns --no changes Increase in tone noted RUE/RLE  Assessment/Plan: 1. 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  Physiatrist is providing close team supervision and 24 hour management of active medical problems listed below.  Physiatrist and rehab team continue to assess barriers to discharge/monitor patient  progress toward functional and medical goals  Care Tool:  Bathing    Body parts bathed by patient: Right arm, Chest, Abdomen, Front perineal area, Buttocks, Right upper leg, Left upper leg, Left lower leg, Face, Left arm, Right lower leg   Body parts bathed by helper: Left arm, Right lower leg     Bathing assist Assist Level: Minimal Assistance - Patient > 75%     Upper Body Dressing/Undressing Upper body dressing   What is the patient wearing?: Pull over shirt    Upper body assist Assist Level: Supervision/Verbal cueing    Lower Body Dressing/Undressing Lower body dressing      What is the patient wearing?: Underwear/pull up, Pants     Lower body assist Assist for lower body dressing: Minimal Assistance - Patient > 75%     Toileting Toileting    Toileting assist Assist for toileting: Moderate Assistance - Patient 50 - 74%     Transfers Chair/bed transfer  Transfers assist  Chair/bed transfer activity did not occur: Safety/medical concerns  Chair/bed transfer assist level: Minimal Assistance - Patient > 75%     Locomotion Ambulation   Ambulation assist      Assist level: Minimal Assistance - Patient > 75% Assistive device: Walker-rolling Max distance: 75ft   Walk 10 feet activity   Assist     Assist level: Minimal Assistance - Patient > 75% Assistive device: Walker-rolling   Walk 50 feet activity   Assist Walk 50 feet with 2 turns activity did not occur: Safety/medical concerns  Assist level: Minimal Assistance - Patient > 75% Assistive device: Walker-rolling    Walk 150 feet activity   Assist Walk 150 feet activity  did not occur: Safety/medical concerns  Assist level: Moderate Assistance - Patient - 50 - 74% Assistive device: Lite Gait    Walk 10 feet on uneven surface  activity   Assist Walk 10 feet on uneven surfaces activity did not occur: Safety/medical concerns         Wheelchair     Assist Will patient use  wheelchair at discharge?: Yes Type of Wheelchair: Manual    Wheelchair assist level: Set up assist, Supervision/Verbal cueing Max wheelchair distance: 170ft    Wheelchair 50 feet with 2 turns activity    Assist        Assist Level: Set up assist, Supervision/Verbal cueing   Wheelchair 150 feet activity     Assist     Assist Level: Supervision/Verbal cueing    Medical Problem List and Plan: 1.Right hemiparesis, now with spasticity and functional deficitssecondary to left subcortical lacunar infarct.   Continue CIR  Encourage compliance with orthosis again - pt refusing  Baclofen 5 twice daily started on 8/7--increase to TID today 2. Antithrombotics: -DVT/anticoagulation:Pharmaceutical:Lovenox -antiplatelet therapy: ASA/Plavix x3 weeks followed by ASA alone 3. Pain Management:N/A 4. Mood:LCSW to follow for evaluation and support -antipsychotic agents: N/A 5. Neuropsych: This patientiscapable of making decisions on hisown behalf.may need neuropsych for adjustment   May need behavior plan if pt is disruptive at noc, remains a little disinhibited  -poor insight    Discussed with neuropsych previously 6. Skin/Wound Care:Routine pressure re ief measures. Maintain adequate nutrition and hydration status 7. Fluids/Electrolytes/Nutrition:Monitor I/O.   8. HTN: Monitor BP bid. Continue Norvasc 10mg  daily ,Coreg 6.25mg  BID   Hydralazine 10 3 times daily started on 8/2, increased to 25 3 times daily on 8/3, increased again on 8/4, increased again on 8/5, increased on 8/7 Vitals:   03/15/19 2214 03/16/19 0513  BP: (!) 157/74 (!) 164/95  Pulse:  82  Resp:  18  Temp:  97.6 F (36.4 C)  SpO2:  93%   Has been bordereline, meds recently increased--observe today 9. Dyslipidemia: On Lipitor.  10. Acute angioedema (face/lips): Resolved with d/c ACE and IV solumedrol.  Resumed regular texturefoods without issue. 11.Leucocytosis:     WBCs 11.9 on 8/3, labs ordered for Monday  Afebrile 12.  Constipation: Continue to monitor 13.  New diagnosis of diabetes mellitus with hyperglycemia  Hemoglobin A1c 7.9 on 8/4  Diabetic education ongoing  Diet changed to Carb modified - anticipate continued noncompliance esp at discharge  Metformin increased to 50 twice daily on 8/7  -borderline control  LOS: 12 days A FACE TO FACE EVALUATION WAS PERFORMED  Meredith Staggers 03/16/2019, 11:06 AM

## 2019-03-17 ENCOUNTER — Inpatient Hospital Stay (HOSPITAL_COMMUNITY): Payer: Medicaid Other

## 2019-03-17 DIAGNOSIS — R7309 Other abnormal glucose: Secondary | ICD-10-CM

## 2019-03-17 DIAGNOSIS — I639 Cerebral infarction, unspecified: Secondary | ICD-10-CM

## 2019-03-17 DIAGNOSIS — R4587 Impulsiveness: Secondary | ICD-10-CM

## 2019-03-17 LAB — GLUCOSE, CAPILLARY
Glucose-Capillary: 130 mg/dL — ABNORMAL HIGH (ref 70–99)
Glucose-Capillary: 237 mg/dL — ABNORMAL HIGH (ref 70–99)
Glucose-Capillary: 154 mg/dL — ABNORMAL HIGH (ref 70–99)
Glucose-Capillary: 203 mg/dL — ABNORMAL HIGH (ref 70–99)

## 2019-03-17 NOTE — Progress Notes (Signed)
Occupational Therapy Session Note  Patient Details  Name: Casey Petty MRN: [id number] Date of Birth: August 21, 1960  Today's Date: 03/17/2019 OT Individual Time: 1057-1200 OT Individual Time Calculation (min): 63 min    Short Term Goals: Week 2:  OT Short Term Goal 1 (Week 2): Pt will complete UB bathing with supervision following hemi techniques. OT Short Term Goal 2 (Week 2): Pt will complete LB dressing sit to stand with supervision except for AFO on the left foot. OT Short Term Goal 3 (Week 2): Pt will complete LB bathing sit to stand with min guard assist. OT Short Term Goal 4 (Week 2): Pt completed toilet transfer with min guard assist using the RW for support.  Skilled Therapeutic Interventions/Progress Updates:    Pt received supine ready for therapy. Pt c/o 7/10 shoulder pain described as soreness, intervention included weight bearing and PROM. Pt donned pants with cueing required for hemi technique, CGA for standing balance. Pt initially denying use of AFO but was able to be convinced to use. Pt propelled w/c to therapy gym via hemi technique with (S). Pt used RW to complete 135 ft of functional mobility with cueing required for R LE management/foot drag. Pt required min A during turns d/t R foot catching. Pt completed tall kneeling with bench, while NMES was applied to R bicep. In B weight bearing pt completed elbow flex/ext, in conjunction with stimulation from NMES. Pt also completed gravity eliminated elbow flex/ext to focus on reducing shoulder and trunk compensations. Pt completed 3x 20 mini squats and full sit <> stands from EOB. Pt returned to room and was left supine with all needs met.    1:1 NMES applied to biceps to increase muscle activation and AAROM.   Ratio 1:3 Rate 35 pps Waveform- Asymmetric Ramp 1.0 Pulse 300 Intensity- 8.0 Duration - 15 min  Report of pain at the beginning of session: 0/10 Report of pain at the end of session: 0/10  No adverse reactions  after treatment and is skin intact.    Therapy Documentation Precautions:  Precautions Precautions: Fall Precaution Comments: right UE and LE hemiparesis Restrictions Weight Bearing Restrictions: No   Therapy/Group: Individual Therapy  Curtis Sites 03/17/2019, 12:02 PM

## 2019-03-17 NOTE — Progress Notes (Signed)
[place] PHYSICAL MEDICINE & REHABILITATION PROGRESS NOTE   Subjective/Complaints: Slept well. No new complaints this morning  ROS: Patient denies fever, rash, sore throat, blurred vision, nausea, vomiting, diarrhea, cough, shortness of breath or chest pain, joint or back pain, headache, or mood change.   Objective:   No results found. No results for input(s): WBC, HGB, HCT, PLT in the last 72 hours. No results for input(s): NA, K, CL, CO2, GLUCOSE, BUN, CREATININE, CALCIUM in the last 72 hours.  Intake/Output Summary (Last 24 hours) at 03/17/2019 1025 Last data filed at 03/17/2019 0920 Gross per 24 hour  Intake 600 ml  Output 725 ml  Net -125 ml     Physical Exam: Vital Signs Blood pressure 130/84, pulse 85, temperature 98.1 F (36.7 C), resp. rate 16, height 5\' 7"  (1.702 m), weight 72.3 kg, SpO2 98 %. Constitutional: No distress . Vital signs reviewed. HEENT: EOMI, oral membranes moist Neck: supple Cardiovascular: RRR without murmur. No JVD    Respiratory: CTA Bilaterally without wheezes or rales. Normal effort    GI: BS +, non-tender, non-distended  Psych: Impulsive Musc: No edema.  No tenderness. Neurologic: Alert. Speech dysarthric. A little disinhibited Motor: LUE/LLE: Grossly 5/5 RUE: Shoulder abduction 2-/5, elbow flexion/extension 2/5, handgrip 0/5, stable exam Right lower extremity: Hip flexion, knee extension 4/5, ankle dorsiflexion 0/5, stable, synergistic patterns --stable Increase in tone noted RUE/RLE  Assessment/Plan: 1. 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  Physiatrist is providing close team supervision and 24 hour management of active medical problems listed below.  Physiatrist and rehab team continue to assess barriers to discharge/monitor patient progress toward functional and medical goals  Care Tool:  Bathing    Body  parts bathed by patient: Right arm, Chest, Abdomen, Front perineal area, Buttocks, Right upper leg, Left upper leg, Left lower leg, Face, Left arm, Right lower leg   Body parts bathed by helper: Left arm, Right lower leg     Bathing assist Assist Level: Minimal Assistance - Patient > 75%     Upper Body Dressing/Undressing Upper body dressing   What is the patient wearing?: Pull over shirt    Upper body assist Assist Level: Supervision/Verbal cueing    Lower Body Dressing/Undressing Lower body dressing      What is the patient wearing?: Underwear/pull up, Pants     Lower body assist Assist for lower body dressing: Minimal Assistance - Patient > 75%     Toileting Toileting    Toileting assist Assist for toileting: Moderate Assistance - Patient 50 - 74%     Transfers Chair/bed transfer  Transfers assist  Chair/bed transfer activity did not occur: Safety/medical concerns  Chair/bed transfer assist level: Minimal Assistance - Patient > 75%     Locomotion Ambulation   Ambulation assist      Assist level: Minimal Assistance - Patient > 75% Assistive device: Walker-rolling Max distance: 92ft   Walk 10 feet activity   Assist     Assist level: Minimal Assistance - Patient > 75% Assistive device: Walker-rolling   Walk 50 feet activity   Assist Walk 50 feet with 2 turns activity did not occur: Safety/medical concerns  Assist level: Minimal Assistance - Patient > 75% Assistive device: Walker-rolling    Walk 150 feet activity   Assist Walk 150 feet activity did not occur: Safety/medical concerns  Assist level: Moderate Assistance - Patient - 50 - 74%  Assistive device: Lite Gait    Walk 10 feet on uneven surface  activity   Assist Walk 10 feet on uneven surfaces activity did not occur: Safety/medical concerns         Wheelchair     Assist Will patient use wheelchair at discharge?: Yes Type of Wheelchair: Manual    Wheelchair assist  level: Set up assist, Supervision/Verbal cueing Max wheelchair distance: 171ft    Wheelchair 50 feet with 2 turns activity    Assist        Assist Level: Set up assist, Supervision/Verbal cueing   Wheelchair 150 feet activity     Assist     Assist Level: Supervision/Verbal cueing    Medical Problem List and Plan: 1.Right hemiparesis, now with spasticity and functional deficitssecondary to left subcortical lacunar infarct.   Continue CIR  Encourage compliance with orthosis again - pt refusing  Baclofen 5 twice daily started on 8/7--increased to TID 8/8 2. Antithrombotics: -DVT/anticoagulation:Pharmaceutical:Lovenox -antiplatelet therapy: ASA/Plavix x3 weeks followed by ASA alone 3. Pain Management:N/A 4. Mood:LCSW to follow for evaluation and support -antipsychotic agents: N/A 5. Neuropsych: This patientiscapable of making decisions on hisown behalf.may need neuropsych for adjustment    remains a little disinhibited  -poor insight    Discussed with neuropsych previously 6. Skin/Wound Care:Routine pressure re ief measures. Maintain adequate nutrition and hydration status 7. Fluids/Electrolytes/Nutrition:Monitor I/O.   8. HTN: Monitor BP bid. Continue Norvasc 10mg  daily ,Coreg 6.25mg  BID   Hydralazine 10 3 times daily started on 8/2, increased to 25 3 times daily on 8/3, increased again on 8/4, increased again on 8/5, increased on 8/7 Vitals:   03/16/19 1930 03/17/19 0513  BP: (!) 168/85 130/84  Pulse: 85 85  Resp: 18 16  Temp: 97.6 F (36.4 C) 98.1 F (36.7 C)  SpO2: 96% 98%   Has been bordereline, ok today---continue to watch 9. Dyslipidemia: On Lipitor.  10. Acute angioedema (face/lips): Resolved with d/c ACE and IV solumedrol.  Resumed regular texturefoods without issue. 11.Leucocytosis:    WBCs 11.9 on 8/3, labs ordered for Monday  Afebrile 12.  Constipation: Continue to monitor 13.  New diagnosis of diabetes  mellitus with hyperglycemia  Hemoglobin A1c 7.9 on 8/4  Diabetic education ongoing  Diet changed to Carb modified - anticipate continued noncompliance esp at discharge  Metformin increased to 500 twice daily on 8/7  -borderline control, higher in evenings typically---no changes today  LOS: 13 days A FACE TO [place] 03/17/2019, 10:25 AM

## 2019-03-18 ENCOUNTER — Inpatient Hospital Stay (HOSPITAL_COMMUNITY): Payer: Medicaid Other

## 2019-03-18 ENCOUNTER — Inpatient Hospital Stay (HOSPITAL_COMMUNITY): Payer: Medicaid Other | Admitting: Occupational Therapy

## 2019-03-18 LAB — BASIC METABOLIC PANEL WITH GFR
Anion gap: 15 (ref 5–15)
BUN: 16 mg/dL (ref 6–20)
CO2: 18 mmol/L — ABNORMAL LOW (ref 22–32)
Calcium: 9.7 mg/dL (ref 8.9–10.3)
Chloride: 102 mmol/L (ref 98–111)
Creatinine, Ser: 1.02 mg/dL (ref 0.61–1.24)
GFR calc Af Amer: >60 mL/min
GFR calc non Af Amer: >60 mL/min
Glucose, Bld: 203 mg/dL — ABNORMAL HIGH (ref 70–99)
Potassium: 4.1 mmol/L (ref 3.5–5.1)
Sodium: 135 mmol/L (ref 135–145)

## 2019-03-18 LAB — CBC
HCT: 46.2 % (ref 39.0–52.0)
Hemoglobin: 15.2 g/dL (ref 13.0–17.0)
MCH: 30.4 pg (ref 26.0–34.0)
MCHC: 32.9 g/dL (ref 30.0–36.0)
MCV: 92.4 fL (ref 80.0–100.0)
Platelets: 278 10*3/uL (ref 150–400)
RBC: 5 MIL/uL (ref 4.22–5.81)
RDW: 12.8 % (ref 11.5–15.5)
WBC: 10.2 10*3/uL (ref 4.0–10.5)
nRBC: 0 % (ref 0.0–0.2)

## 2019-03-18 LAB — GLUCOSE, CAPILLARY
Glucose-Capillary: 147 mg/dL — ABNORMAL HIGH (ref 70–99)
Glucose-Capillary: 170 mg/dL — ABNORMAL HIGH (ref 70–99)
Glucose-Capillary: 143 mg/dL — ABNORMAL HIGH (ref 70–99)
Glucose-Capillary: 188 mg/dL — ABNORMAL HIGH (ref 70–99)

## 2019-03-18 NOTE — Plan of Care (Signed)
  Problem: RH Balance Goal: LTG Patient will maintain dynamic standing with ADLs (OT) Description: LTG:  Patient will maintain dynamic standing balance with assist during activities of daily living (OT)  Flowsheets (Taken 03/18/2019 0745) LTG: Pt will maintain dynamic standing balance during ADLs with: (Goal downgraded based on LOS and slower progress) Minimal Assistance - Patient > 75%   Problem: RH Bathing Goal: LTG Patient will bathe all body parts with assist levels (OT) Description: LTG: Patient will bathe all body parts with assist levels (OT) Flowsheets (Taken 03/18/2019 0745) LTG: Pt will perform bathing with assistance level/cueing: (Goal downgraded based on LOS and slower progress.) Contact Guard/Touching assist   Problem: RH Dressing Goal: LTG Patient will perform lower body dressing w/assist (OT) Description: LTG: Patient will perform lower body dressing with assist, with/without cues in positioning using equipment (OT) Flowsheets (Taken 03/18/2019 0745) LTG: Pt will perform lower body dressing with assistance level of: (Goal downgraded based on slower progress) Minimal Assistance - Patient > 75%   Problem: RH Toileting Goal: LTG Patient will perform toileting task (3/3 steps) with assistance level (OT) Description: LTG: Patient will perform toileting task (3/3 steps) with assistance level (OT)  Flowsheets (Taken 03/18/2019 0745) LTG: Pt will perform toileting task (3/3 steps) with assistance level: (Goal downgraded based on slower progress) Contact Guard/Touching assist   Problem: RH Functional Use of Upper Extremity Goal: LTG Patient will use RT/LT upper extremity as a (OT) Description: LTG: Patient will use right/left upper extremity as a stabilizer/gross assist/diminished/nondominant/dominant level with assist, with/without cues during functional activity (OT) Flowsheets (Taken 03/18/2019 0745) LTG: Use of upper extremity in functional activities: RUE as gross assist  level LTG: Pt will use upper extremity in functional activity with assistance level of: (Goal dowgraded based on slower progress) Moderate Assistance - Patient 50 - 74%   Problem: RH Simple Meal Prep Goal: LTG Patient will perform simple meal prep w/assist (OT) Description: LTG: Patient will perform simple meal prep with assistance, with/without cues (OT). Flowsheets (Taken 03/18/2019 0745) LTG: Pt will perform simple meal prep with assistance level of: (Goal downgraded based on slower progress) Independent with assistive device LTG: Pt will perform simple meal prep w/level of: Wheelchair level   Problem: RH Tub/Shower Transfers Goal: LTG Patient will perform tub/shower transfers w/assist (OT) Description: LTG: Patient will perform tub/shower transfers with assist, with/without cues using equipment (OT) Flowsheets (Taken 03/18/2019 0745) LTG: Pt will perform tub/shower stall transfers with assistance level of: (Goal downgraded based on slower progress) Minimal Assistance - Patient > 75%

## 2019-03-18 NOTE — Progress Notes (Signed)
[place] PHYSICAL MEDICINE & REHABILITATION PROGRESS NOTE   Subjective/Complaints: Right shoulder pain no trauma, pt has had onset after using UE ergo per OT   ROS: Patient without , nausea, vomiting, diarrhea, cough, shortness of breath or chest pain, joint or back pain,  Objective:   No results found. No results for input(s): WBC, HGB, HCT, PLT in the last 72 hours. No results for input(s): NA, K, CL, CO2, GLUCOSE, BUN, CREATININE, CALCIUM in the last 72 hours.  Intake/Output Summary (Last 24 hours) at 03/18/2019 0856 Last data filed at 03/18/2019 0721 Gross per 24 hour  Intake 720 ml  Output 700 ml  Net 20 ml     Physical Exam: Vital Signs Blood pressure (!) 146/95, pulse 72, temperature 98.2 F (36.8 C), temperature source Oral, resp. rate 19, height 5\' 7"  (1.702 m), weight 72.3 kg, SpO2 97 %. Constitutional: No distress . Vital signs reviewed. HEENT: EOMI, oral membranes moist Neck: supple Cardiovascular: RRR without murmur. No JVD    Respiratory: CTA Bilaterally without wheezes or rales. Normal effort    GI: BS +, non-tender, non-distended  Psych: Impulsive Musc: No edema.  No tenderness. Neurologic: Alert. Speech dysarthric. A little disinhibited Motor: LUE/LLE: Grossly 5/5 RUE: Shoulder abduction 2-/5, elbow flexion/extension 2/5, handgrip 0/5, stable exam Right lower extremity: Hip flexion, knee extension 4/5, ankle dorsiflexion 0/5, stable, synergistic patterns --stable Increase in tone noted RUE/RLE  Assessment/Plan: 1. 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  Physiatrist is providing close team supervision and 24 hour management of active medical problems listed below.  Physiatrist and rehab team continue to assess barriers to discharge/monitor patient progress toward functional and medical goals  Care Tool:  Bathing    Body parts  bathed by patient: Right arm, Chest, Abdomen, Front perineal area, Buttocks, Right upper leg, Left upper leg, Left lower leg, Face, Left arm, Right lower leg   Body parts bathed by helper: Left arm, Right lower leg     Bathing assist Assist Level: Minimal Assistance - Patient > 75%     Upper Body Dressing/Undressing Upper body dressing   What is the patient wearing?: Pull over shirt    Upper body assist Assist Level: Supervision/Verbal cueing    Lower Body Dressing/Undressing Lower body dressing      What is the patient wearing?: Underwear/pull up, Pants     Lower body assist Assist for lower body dressing: Minimal Assistance - Patient > 75%     Toileting Toileting    Toileting assist Assist for toileting: Moderate Assistance - Patient 50 - 74%     Transfers Chair/bed transfer  Transfers assist  Chair/bed transfer activity did not occur: Safety/medical concerns  Chair/bed transfer assist level: Minimal Assistance - Patient > 75%     Locomotion Ambulation   Ambulation assist      Assist level: Minimal Assistance - Patient > 75% Assistive device: Walker-rolling Max distance: 41ft   Walk 10 feet activity   Assist     Assist level: Minimal Assistance - Patient > 75% Assistive device: Walker-rolling   Walk 50 feet activity   Assist Walk 50 feet with 2 turns activity did not occur: Safety/medical concerns  Assist level: Minimal Assistance - Patient > 75% Assistive device: Walker-rolling    Walk 150 feet activity   Assist Walk 150 feet activity did not occur: Safety/medical concerns  Assist level: Moderate Assistance - Patient -  50 - 74% Assistive device: Lite Gait    Walk 10 feet on uneven surface  activity   Assist Walk 10 feet on uneven surfaces activity did not occur: Safety/medical concerns         Wheelchair     Assist Will patient use wheelchair at discharge?: Yes Type of Wheelchair: Manual    Wheelchair assist level:  Set up assist, Supervision/Verbal cueing Max wheelchair distance: 154ft    Wheelchair 50 feet with 2 turns activity    Assist        Assist Level: Set up assist, Supervision/Verbal cueing   Wheelchair 150 feet activity     Assist     Assist Level: Supervision/Verbal cueing    Medical Problem List and Plan: 1.Right hemiparesis, now with spasticity and functional deficitssecondary to left subcortical lacunar infarct.   Continue CIR  Encourage compliance with orthosis again - pt refusing  Baclofen 5 twice daily started on 8/7--increased to TID 8/8 2. Antithrombotics: -DVT/anticoagulation:Pharmaceutical:Lovenox -antiplatelet therapy: ASA/Plavix x3 weeks followed by ASA alone 3. Pain Management:N/A 4. Mood:LCSW to follow for evaluation and support -antipsychotic agents: N/A 5. Neuropsych: This patientiscapable of making decisions on hisown behalf.may need neuropsych for adjustment    remains a little disinhibited  -poor insight    6. Skin/Wound Care:Routine pressure re ief measures. Maintain adequate nutrition and hydration status 7. Fluids/Electrolytes/Nutrition:Monitor I/O.   8. HTN: Monitor BP bid. Continue Norvasc 10mg  daily ,Coreg 6.25mg  BID   Hydralazine 10 3 times daily started on 8/2, increased to 25 3 times daily on 8/3, increased again on 8/4, increased again on 8/5, increased on 8/7 Vitals:   03/17/19 2014 03/18/19 0414  BP: (!) 154/85 (!) 146/95  Pulse: 84 72  Resp: 19 19  Temp: 98.9 F (37.2 C) 98.2 F (36.8 C)  SpO2: 99% 97%  may be able to increase Coreg as rates still>70 bpm  9. Dyslipidemia: On Lipitor.  10. Acute angioedema (face/lips): Resolved with d/c ACE and IV solumedrol.  Resumed regular texturefoods without issue. 11.Leucocytosis:    WBCs 11.9 on 8/3, labs ordered for Monday  Afebrile 12.  Constipation: Continue to monitor 13.  New diagnosis of diabetes mellitus with hyperglycemia  Hemoglobin  A1c 7.9 on 8/4  Diabetic education ongoing  Diet changed to Carb modified - anticipate continued noncompliance esp at discharge  Metformin increased to 500 twice daily on 8/7, fair control monitor prior to changes   - CBG (last 3)  Recent Labs    03/17/19 1704 03/17/19 2115 03/18/19 0632  GLUCAP 154* 203* 147*  LOS: 14 days A FACE TO FACE EVALUATION WAS PERFORMED  Luanna Salk Exa Bomba 03/18/2019, 8:56 AM

## 2019-03-18 NOTE — Progress Notes (Signed)
Occupational Therapy Session Note  Patient Details  Name: Casey Petty MRN: [id number] Date of Birth: 1961-04-09  Today's Date: 03/18/2019 OT Individual Time: 9509-3267 OT Individual Time Calculation (min): 53 min    Short Term Goals: Week 2:  OT Short Term Goal 1 (Week 2): Pt will complete UB bathing with supervision following hemi techniques. OT Short Term Goal 2 (Week 2): Pt will complete LB dressing sit to stand with supervision except for AFO on the left foot. OT Short Term Goal 3 (Week 2): Pt will complete LB bathing sit to stand with min guard assist. OT Short Term Goal 4 (Week 2): Pt completed toilet transfer with min guard assist using the RW for support.  Skilled Therapeutic Interventions/Progress Updates:    Pt began by working on dressing tasks sitting EOB.  He was able to transition to sitting on the right side of the bed with supervision.  Increased time and supervision for donning pullover shirt secondary to not following hemi dressing techniques.  Min guard for LB dressing sit to stand as well with min instructional cueing for hemi dressing techniques.  He was then able to complete squat pivot transfers to the wheelchair with supervision and therapist took him down to the therapy gym.  Once in the gym he transferred squat pivot to the therapy mat with supervision to the left side and therapist applied NMES to the right shoulder secondary to pt reporting increased pain and exhibiting slight inferior anterior subluxation.  He was able to tolerate 20 mins of active stimulation with intensity at 29, PPS at 35, and pulse width at 300.  On and off time were 10 seconds each.  No adverse reactions to therapy stimulation. Finished session with transfer back to the room via wheelchair with pt left sitting up in the wheelchair with call button and phone in reach and alarm in place.  Pt called his significant other and she is coming in for family education tomorrow from 8:00-12:00.    Therapy  Documentation Precautions:  Precautions Precautions: Fall Precaution Comments: right UE and LE hemiparesis Restrictions Weight Bearing Restrictions: No  Pain: Pain Assessment Pain Scale: Faces Pain Score: 0-No pain Faces Pain Scale: Hurts a little bit Pain Type: Neuropathic pain Pain Location: Shoulder Pain Orientation: Right Pain Descriptors / Indicators: Discomfort Pain Onset: With Activity ADL: See Care Tool Section for some details of ADL tasks  Therapy/Group: Individual Therapy  Dasiah Hooley OTR/L 03/18/2019, 11:43 AM

## 2019-03-18 NOTE — Progress Notes (Signed)
Speech Language Pathology Daily Session Note  Patient Details  Name: Casey Petty MRN: [id number] Date of Birth: 03-Dec-1960  Today's Date: 03/18/2019 SLP Individual Time: 1100-1157 SLP Individual Time Calculation (min): 57 min  Short Term Goals: Week 2: SLP Short Term Goal 1 (Week 2): Patient will utilize speech intelligibility strategies at the phrase level with Min A verbal cues to achieve ~75% intelligibility. SLP Short Term Goal 2 (Week 2): Patient will demonstrate selective attention to functional tasks for 30 minutes with Mod A verbal cues. SLP Short Term Goal 3 (Week 2): Patient will demonstrate functional problem solving in semi-complex tasks with Min A verbal cues.  Skilled Therapeutic Interventions: Skilled ST services focused on cognitive skills. SLP facilitated semi-complex problem solving and error awareness skills utilizing novel card task, pt required mod A verbal cues. Pt demonstrated little carryover of strategies to reduce impulsivity for increase error awareness and ability to plan ahead. SLP also facilitated anticipatory awareness and semi-complex verb problem solving skills with functional household scenario questions, pt required min A verbal cues. Pt was left in room with call bell within reach and chair alarm set. ST recommends to continue skilled ST services.      Pain Pain Assessment Pain Scale: Faces Pain Score: 0-No pain Faces Pain Scale: Hurts a little bit Pain Type: Neuropathic pain Pain Location: Shoulder Pain Orientation: Right Pain Descriptors / Indicators: Discomfort Pain Onset: With Activity  Therapy/Group: Individual Therapy  Rameen Quinney  [facility] 03/18/2019, 12:18 PM

## 2019-03-18 NOTE — Progress Notes (Signed)
Physical Therapy Session Note  Patient Details  Name: Casey Petty MRN: [id number] Date of Birth: 02/05/1961  Today's Date: 03/18/2019 PT Individual Time: 8206-0156 PT Individual Time Calculation (min): 72 min   Short Term Goals: Week 2:  PT Short Term Goal 1 (Week 2): = to LTGs based on ELOS  Skilled Therapeutic Interventions/Progress Updates:    Pt supine in bed upon PT arrival, agreeable to therapy tx and denies pain. Pt transferred to sitting with supervision and donned shoes/socks and R AFO with min assist. Pt transferred to w/c with CGA stand pivot and propelled w/c to the gym with supervision x 150 ft using L hemi technique. Pt ambulated x 100 ft with RW and CGA, ambulated x 80 ft without AD and min assist, lack of R knee flexion noted and R circumduction noted. Pt ascneded/desceneded 10 steps this session with L rail only to simulate home set up, step to pattern with min assist and cues for techniques. Pt transferred to tall kneeling on the mat, in this position performed x 10 sitting back onto heels<>up to tall kneeling for hip/hamstring strengthening. Pt transferred to prone and performed x 10 R LE active assisted hamstring curls for neuro re-ed. Pt transferred to sideling and performed active assisted hip/knee flexion for neuro re-ed, pt with extensor pattern kicking in requiring facilitation from therapist to break knee extension during this. Pt transferred to sitting. On elevated mat pt performed seated R LE heelslides using scooter board x 10 for focus on hamstring activation, performed x 10 heel slides on floor x 10 without shoe and with manual resistance. Pt worked on R UE weightbearing with therapist stabilizing pts elbow while performing seated bridges x 5 and then x 2 seated bridges with L UE reaching to increased R UE weightbearing. Pt transferred to w/c sqaut pivot with supervision and propelled w/c back to room. Pt transferred to bed with supervision squat pivot and left supine  with needs in reach and chair alarm set. Therapist assisted to position pt's R UE in more shoulder ER and elbow extension, education to maintain position.   Therapy Documentation Precautions:  Precautions Precautions: Fall Precaution Comments: right UE and LE hemiparesis Restrictions Weight Bearing Restrictions: No    Therapy/Group: Individual Therapy  Netta Corrigan, PT, DPT 03/18/2019, 12:11 PM

## 2019-03-19 ENCOUNTER — Encounter (HOSPITAL_COMMUNITY): Payer: Medicaid Other | Admitting: Occupational Therapy

## 2019-03-19 ENCOUNTER — Ambulatory Visit (HOSPITAL_COMMUNITY): Payer: Medicaid Other

## 2019-03-19 ENCOUNTER — Encounter (HOSPITAL_COMMUNITY): Payer: Medicaid Other

## 2019-03-19 ENCOUNTER — Inpatient Hospital Stay (HOSPITAL_COMMUNITY): Payer: Medicaid Other | Admitting: Physical Therapy

## 2019-03-19 LAB — GLUCOSE, CAPILLARY
Glucose-Capillary: 135 mg/dL — ABNORMAL HIGH (ref 70–99)
Glucose-Capillary: 147 mg/dL — ABNORMAL HIGH (ref 70–99)
Glucose-Capillary: 159 mg/dL — ABNORMAL HIGH (ref 70–99)
Glucose-Capillary: 151 mg/dL — ABNORMAL HIGH (ref 70–99)

## 2019-03-19 MED ORDER — CARVEDILOL 12.5 MG PO TABS
12.5000 mg | ORAL_TABLET | Freq: Two times a day (BID) | ORAL | Status: DC
  Administered 2019-03-19 – 2019-03-20 (×2): 12.5 mg via ORAL
  Filled 2019-03-19 (×2): qty 1

## 2019-03-19 NOTE — Progress Notes (Signed)
Physical Therapy Session Note  Patient Details  Name: Casey Petty MRN: [id number] Date of Birth: 09/04/1960  Today's Date: 03/19/2019 PT Individual Time: 4098-1191 PT Individual Time Calculation (min): 38 min   Short Term Goals: Week 2:  PT Short Term Goal 1 (Week 2): = to LTGs based on ELOS  Skilled Therapeutic Interventions/Progress Updates:    Pt received sitting in w/c and agreeable to therapy session. Transported to/from gym in w/c for time management. Pt wearing R LE AFO and shoes with R toe cap. Ambulated 275ft using RW with CGA for steadying and with increased distance required increased cuing for R LE swing phase management to avoid LOB - demonstrates decreased gait speed and continued poor R LE foot clearance with noticeable toe drag but smoother movement with toe cap. Ascended/descended 12 steps using L HR with CGA - pt ascended forward and descended lateral requiring significantly increased time to bring R LE down onto next step due to lack of adequate hip/knee flexion muscle activaiton. Performed L LE w/c propulsion ~117ft with supervision. Patient participated in [facility] and demonstrates increased fall risk as noted by score of  22/56.  (<36= high risk for falls, close to 100%; 37-45 significant >80%; 46-51 moderate >50%; 52-55 lower >25%). Transported back to room in w/c. Squat pivot w/c>EOB with supervision. Sit>supine with supervision. Pt left supine in bed with needs in reach and bed alarm on.  Therapy Documentation Precautions:  Precautions Precautions: Fall Precaution Comments: right UE and LE hemiparesis Restrictions Weight Bearing Restrictions: No  Pain: Denies pain during session.  Balance: Balance Balance Assessed: Yes Standardized Balance Assessment Standardized Balance Assessment: Berg Balance Test Berg Balance Test Sit to Stand: Able to stand  independently using hands Standing Unsupported: Able to stand 2 minutes with supervision Sitting with  Back Unsupported but Feet Supported on Floor or Stool: Able to sit safely and securely 2 minutes Stand to Sit: Sits safely with minimal use of hands Transfers: Able to transfer with verbal cueing and /or supervision Standing Unsupported with Eyes Closed: Able to stand 10 seconds with supervision Standing Ubsupported with Feet Together: Needs help to attain position and unable to hold for 15 seconds From Standing, Reach Forward with Outstretched Arm: Loses balance while trying/requires external support From Standing Position, Pick up Object from Floor: Unable to try/needs assist to keep balance From Standing Position, Turn to Look Behind Over each Shoulder: Needs assist to keep from losing balance and falling Turn 360 Degrees: Needs assistance while turning Standing Unsupported, Alternately Place Feet on Step/Stool: Needs assistance to keep from falling or unable to try Standing Unsupported, One Foot in Front: Needs help to step but can hold 15 seconds Standing on One Leg: Able to lift leg independently and hold equal to or more than 3 seconds Total Score: 22    Therapy/Group: Individual Therapy  Choua Chalker M Shey Yott,PT, DPT 03/19/2019, 2:43 PM

## 2019-03-19 NOTE — Progress Notes (Signed)
Social Work Discharge Note   The overall goal for the admission was met for:   Discharge location: Yes-HOME WITH [facility]-24 HR CARE  Length of Stay: Yes-15 DAYS  Discharge activity level: Yes-SUPERVISION-CGA LEVEL  Home/community participation: Yes  Services provided included: MD, RD, PT, OT, SLP, RN, CM, Pharmacy, Neuropsych and SW  Financial Services: Other: PENDING MEDICAID  Follow-up services arranged: Home Health: [facility]-PT,OT,SP,RN,SW, DME: [facility]-WHEELCHAIR, ROLLING WALKER, 3 IN 1, & TUB BENCH and Patient/Family has no preference for HH/DME agencies  Comments (or additional information):SANDRA WAS [place]. UNSURE IF PT WILL ALLOW HER TO ASSIST HIM, HE IS NOT FULLY AWARE OF HIS DEFICITS AND FEELS HE CAN DO ON HIS OWN. PCP SET UP [place] 8/19 @ 2:30 PM. HIS SSD AND MEDICAID IS PENDING.  Patient/Family verbalized understanding of follow-up arrangements: Yes  Individual responsible for coordination of the follow-up plan: SANDRA-GIRLFRIEND  Confirmed correct DME delivered: Elease Hashimoto 03/19/2019    Elease Hashimoto

## 2019-03-19 NOTE — Progress Notes (Signed)
Inpatient Diabetes Program   AACE/ADA: New Consensus Statement on Inpatient Glycemic Control (2015)  Target Ranges:  Prepandial:   less than 140 mg/dL      Peak postprandial:   less than 180 mg/dL (1-2 hours)      Critically ill patients:  140 - 180 mg/dL   Lab Results  Component Value Date   GLUCAP 135 (H) 03/19/2019   HGBA1C 7.9 (H) 03/11/2019    Review of Glycemic Control  Diabetes history: New Diagnosis Current orders for Inpatient glycemic control: Metformin 850 mg bid   Spoke with Andria Frames, significant other, over the phone in relation to A1c level and glucose checks at home in addition to diet control.   Williemae Natter through the steps of checking glucose. Told her to check 2 times a day fasting and alternating second check.  Spoke to her about patient taking metformin with meals bid. Discussed eliminating sweets and watching portion sizes and him drinking water or sugar free beverages.  Noted Medicaid pending status. If patient does not have insurance yet to get meter please let my team know. We have supplies in our office.  Thanks,  Tama Headings RN, MSN, BC-ADM Inpatient Diabetes Coordinator Team Pager [phone] (8a-5p)

## 2019-03-19 NOTE — Progress Notes (Signed)
Speech Language Pathology Discharge Summary  Patient Details  Name: Casey Petty MRN: [id number] Date of Birth: [birth year]  Today's Date: 03/19/2019 SLP Individual Time: 1003-1045 SLP Individual Time Calculation (min): 42 min   Skilled Therapeutic Interventions:  Skilled ST services focused on education and cognitive skills. SLP facilitated assessment of cognitive skills utilizing subsections of Cognistat, pt demonstrated moderate impairment in construction task, calcuations and similarities, indicating reduce mental flexability and error awareness/semi-complex problem solving skills. SLP provided education of results and pt agreed cognitive skills are not back to baseline. pt required - cues. Pt's girlfriend , caregiver, arrived the last 15 minutes of session. SLP provided education of current deficits and to reduce overall rate and impulsivity for safety and in cognitive/speech tasks. She asked questions pertaining to intelligence following a CVA only, SLP provided education and highlight the importance of assisting pt with complex tasks and to monitor errors. All questions were answered to satisfaction.  Pt was left in room with girlfriend, call bell within reach and bed/chair alarm set. ST recommends to continue skilled ST services.     Patient has met 7 of 7 long term goals.  Patient to discharge at overall Mod;Supervision level.  Reasons goals not met:     Clinical Impression/Discharge Summary:   Pt made great progress meeting 7 out 7 goals, discharging at mod-supervision A for cognitive and speech skills. Pt demonstrates overall impairment in reduced mental flexability, error awareness, impulsivity and semi/complex problem solving skills impacting safety. Pt demonstrates stronger skill set during very functional tasks. Pt requires min A verbal cues to slow rate, for speech intelligibility at sentence level. Pt was upgraded to regular and thin textures mod I. Pt demonstrated gains in these  areas, but would continue to benefit from skilled ST services, although suspect approaching baseline. Pt would benefit from skilled ST services in order to maximize functional independence and reduce burden of care, requiring 24 hour supervision and continue ST services. Care Partner:  Caregiver Able to Provide Assistance: Yes  Type of Caregiver Assistance: Physical;Cognitive  Recommendation:  Home Health SLP;24 hour supervision/assistance  Rationale for SLP Follow Up: Maximize functional communication;Maximize cognitive function and independence;Reduce caregiver burden   Equipment: N/A   Reasons for discharge: Discharged from hospital   Patient/Family Agrees with Progress Made and Goals Achieved: Yes    Casey Petty  [facility] 03/19/2019, 12:36 PM

## 2019-03-19 NOTE — Progress Notes (Signed)
Occupational Therapy Discharge Summary  Patient Details  Name: Casey Petty MRN: [id number] Date of Birth: 01-04-1961  Today's Date: 03/19/2019 OT Individual Time: 0849-1000 OT Individual Time Calculation (min): 71 min   Session Note:  Pt began session working on bathing and dressing at shower level.  Min guard assist for transfer from the wheelchair to the shower bench squat pivot.  Once on the seat he was able to remove all of his LB clothing sit to stand with supervision as well as removing his pullover shirt.  He then completed all bathing with supervision sit to stand as well with use of hemi technique for washing the left arm and left hand.  He was able to transfer out of the shower and complete dressing sit to stand from the sink.  Min instructional cueing needed for hemi dressing techniques but pt able to donn his pullover shirt with setup as well as his underpants and pants.  He was able to sit to zip and fasten his pants after pulling them over his hips.  Supervision for donning socks with mod assist for donning right shoe and AFO.  Therapist applied shoe buttons as well and demonstrated use.  Pt next completed oral hygiene in sitting with modified independence.  He then propelled his wheelchair down to the tubshower room where he completed squat pivot transfer on and off of the toilet with min instructional cueing.  Returned back to room to complete session where he was educated on self AAROM exercises for the RUE and handouts for reference were given.  Call button and phone in reach with safety belt in place.  Pt's significant other was not in for session as was planned to begin at 9:00.    Patient has met 11 of 14 long term goals due to improved activity tolerance, improved balance, postural control, ability to compensate for deficits, functional use of  RIGHT upper and RIGHT lower extremity, improved attention, improved awareness and improved coordination.  Patient to discharge at [facility] Assist level.  Patient's care partner is independent to provide the necessary physical and cognitive assistance at discharge.    Reasons goals not met: Pt needs min guard for toilet transfers as well as supervision for carryover from session to session and mod assist for donning right shoe and AFO  Recommendation:  Patient will benefit from ongoing skilled OT services in home health setting to continue to advance functional skills in the area of BADL and Reduce care partner burden.  Pt continues to demonstrate impulsivity with all transfers, selfcare, and sit to stand.  He also demonstrates RUE and RLE hemiparesis with increased flexor tone in the biceps, wrist, hand and pectoral.  Currently, he is at a min guard to min assist level overall for ADLs.  Feel he will benefit from continued HHOT and 24 hr supervision at home to progress to modified independent level.    Equipment: tub bench and 3:1  Reasons for discharge: treatment goals met and discharge from hospital  Patient/family agrees with progress made and goals achieved: Yes  OT Discharge Precautions/Restrictions  Precautions Precautions: Fall Precaution Comments: right UE and LE hemiparesis Restrictions Weight Bearing Restrictions: No  Pain Pain Assessment Pain Scale: Faces Pain Score: 0-No pain ADL ADL Eating: Modified independent Where Assessed-Eating: Wheelchair Grooming: Modified independent Where Assessed-Grooming: Wheelchair Upper Body Bathing: Supervision/safety Where Assessed-Upper Body Bathing: Chair, Multimedia programmer Lower Body Bathing: Contact guard Where Assessed-Lower Body Bathing: Shower Upper Body Dressing: Supervision/safety Where Assessed-Upper Body Dressing: Wheelchair Lower Body  Dressing: Moderate assistance Where Assessed-Lower Body Dressing: Wheelchair Toileting: Contact guard Where Assessed-Toileting: Bedside Commode Toilet Transfer: Therapist, music Method: Education officer, museum: Radiographer, therapeutic: Metallurgist Method: Engineer, production: Facilities manager: Curator Method: Education officer, environmental: Radio broadcast assistant Vision Baseline Vision/History: No visual deficits;Wears glasses Wears Glasses: Reading only Patient Visual Report: No change from baseline Vision Assessment?: No apparent visual deficits Eye Alignment: Within Functional Limits Ocular Range of Motion: Within Functional Limits Alignment/Gaze Preference: Within Defined Limits Convergence: Within functional limits Visual Fields: No apparent deficits Perception  Perception: Within Functional Limits Praxis Praxis: Intact Cognition Overall Cognitive Status: Impaired/Different from baseline Arousal/Alertness: Awake/alert Orientation Level: Oriented X4 Attention: Selective;Sustained Focused Attention: Appears intact Sustained Attention: Appears intact Selective Attention: Impaired Selective Attention Impairment: Functional complex;Verbal complex Memory: Impaired Memory Impairment: Decreased recall of new information;Decreased short term memory Decreased Short Term Memory: Functional basic Awareness: Impaired Awareness Impairment: Emergent impairment;Anticipatory impairment Problem Solving: Impaired Problem Solving Impairment: Functional complex;Verbal complex Behaviors: Impulsive Safety/Judgment: Impaired Comments: Pt with decreased carryover of safety from session to session with increased impulsivity. Sensation Sensation Light Touch: Appears Intact(RUE WFLS) Proprioception: Appears Intact(RUE intact) Stereognosis: Not tested Coordination Gross Motor Movements are Fluid and Coordinated: No Fine Motor Movements are Fluid and Coordinated: No Coordination and Movement Description: Pt with Brunnstrum stage III movement in the right arm with stage II in the hand  with increased digit flexor tone.  Needs max hand over hand for integration as a stabilizer currently. Motor  Motor Motor: Hemiplegia;Abnormal tone Motor - Discharge Observations: Pt still with significant RUE and RLE hemiparesis Mobility  Bed Mobility Bed Mobility: Supine to Sit;Sit to Supine Supine to Sit: Supervision/Verbal cueing Sit to Supine: Supervision/Verbal cueing Transfers Stand to Sit: Supervision/Verbal cueing  Trunk/Postural Assessment  Cervical Assessment Cervical Assessment: Within Functional Limits Thoracic Assessment Thoracic Assessment: Within Functional Limits Lumbar Assessment Lumbar Assessment: Within Functional Limits  Balance Balance Balance Assessed: Yes Static Sitting Balance Static Sitting - Balance Support: Feet supported Static Sitting - Level of Assistance: 7: Independent Dynamic Sitting Balance Dynamic Sitting - Balance Support: Feet unsupported;During functional activity Dynamic Sitting - Level of Assistance: 5: Stand by assistance Static Standing Balance Static Standing - Balance Support: Bilateral upper extremity supported Static Standing - Level of Assistance: 5: Stand by assistance Dynamic Standing Balance Dynamic Standing - Balance Support: During functional activity Dynamic Standing - Level of Assistance: 4: Min assist Extremity/Trunk Assessment RUE Assessment RUE Assessment: Exceptions to [facility] General Strength Comments: Brunnstrum stage III in the arm and stage II in the hand.  Increased flexor tone is noted in the digits of the hand with no active movement noted to command.  He can elicit synergy pattern at the shoulder and elbow but needs max assist to integrate into function as a gross assist.  He demonstrates increased flexor tone in the elbow, wrist, and thumb especially with increased tone in the pectoral as well. LUE Assessment LUE Assessment: Within Functional Limits   Angelena Sand OTR/L 03/19/2019, 12:54 PM

## 2019-03-19 NOTE — Progress Notes (Signed)
Physical Therapy Discharge Summary  Patient Details  Name: Casey Petty MRN: [id number] Date of Birth: [birth year]  Today's Date: 03/19/2019 PT Individual Time: 1115-1200 PT Individual Time Calculation (min): 45 min    Patient has met 9 of 9 long term goals due to improved activity tolerance, improved balance, improved postural control, increased strength, ability to compensate for deficits and improved attention.  Patient to discharge at an ambulatory level [place].   Patient's care partner is independent to provide the necessary physical assistance at discharge. Pt's girlfriend has attended family education sessions and has completed hands on practice with the patient for transfers and gait.   All goals met.   Recommendation:  Patient will benefit from ongoing skilled PT services in home health setting to continue to advance safe functional mobility, address ongoing impairments in balance, R neuro re-ed, strength, and minimize fall risk.  Equipment: RW and w/c  Reasons for discharge: treatment goals met  Patient/family agrees with progress made and goals achieved: Yes   PT Treatment Interventions: Pt seated in w/c upon PT arrival, agreeable to therapy tx and denies pain at rest. Pt's girlfriend present for family education. Pt propelled w/c throughout the unit 150+ ft with supervision. Pt ambulated 2 x 15 ft with RW and CGA assist from girlfriend and then ascended/descended 8 steps with L rail and min assist from girlfriend, therapist providing cues and education throughout for guarding techniques and appropriate cues to provide for safety. Pt ambulated 2 x 10 ft from w/c<>car and performed car transfer with RW and assist from his girlfriend. Pt performed x 2 floor transfers this session from mat<>floor with cues for techniques and min-mod assist, education on techniques for floor transfer and education on what to do in case of a fall. Pt educated on importance on R UE stretching into  extension and supination, therapist performed the stretch and then pt's girlfriend also performed stretch to ensure accuracy. Pt provided with HEP handout inlcuding LE exercises and R UE PROM. Pt propelled back to room and left in w/c with needs in reach and chair alarm set.   PT Discharge Precautions/Restrictions Precautions Precautions: Fall Precaution Comments: right UE and LE hemiparesis Restrictions Weight Bearing Restrictions: No Vital Signs  Pain Pain Assessment Pain Scale: Faces Pain Score: 0-No pain Vision/Perception  Vision - Assessment Eye Alignment: Within Functional Limits Ocular Range of Motion: Within Functional Limits Alignment/Gaze Preference: Within Defined Limits Convergence: Within functional limits Perception Perception: Within Functional Limits Praxis Praxis: Intact  Cognition Overall Cognitive Status: Impaired/Different from baseline Arousal/Alertness: Awake/alert Orientation Level: Oriented X4 Attention: Selective;Sustained Focused Attention: Appears intact Sustained Attention: Appears intact Selective Attention: Impaired Selective Attention Impairment: Functional complex;Verbal complex Memory: Impaired Memory Impairment: Decreased recall of new information;Decreased short term memory Decreased Short Term Memory: Functional basic Awareness: Impaired Awareness Impairment: Emergent impairment;Anticipatory impairment Problem Solving: Impaired Problem Solving Impairment: Functional complex;Verbal complex Behaviors: Impulsive Safety/Judgment: Impaired Comments: Pt with decreased carryover of safety from session to session with increased impulsivity. Sensation Sensation Light Touch: Appears Intact Proprioception: Appears Intact Stereognosis: Not tested Additional Comments: sensation WFL Coordination Gross Motor Movements are Fluid and Coordinated: No Fine Motor Movements are Fluid and Coordinated: No Coordination and Movement Description:  impaired coordination secondary to R hemiparesis Heel Shin Test: imapired R LE Motor  Motor Motor: Hemiplegia;Abnormal tone Motor - Discharge Observations: Pt still with significant RUE and RLE hemiparesis  Mobility Bed Mobility Bed Mobility: Supine to Sit;Sit to Supine Supine to Sit: Supervision/Verbal cueing Sit to Supine: Supervision/Verbal  cueing Transfers Transfers: Stand to Sit;Stand Pivot Transfers Stand to Sit: Supervision/Verbal cueing Stand Pivot Transfers: Contact Guard/Touching assist Stand Pivot Transfer Details: Verbal cues for precautions/safety;Verbal cues for technique Transfer (Assistive device): Rolling walker Locomotion  Gait Ambulation: Yes Gait Assistance: Contact Guard/Touching assist Gait Distance (Feet): 150 Feet Assistive device: Rolling walker Gait Assistance Details: Verbal cues for gait pattern;Verbal cues for precautions/safety;Verbal cues for safe use of DME/AE Gait Gait: Yes Gait Pattern: Step-to pattern;Decreased step length - left;Decreased stance time - right;Poor foot clearance - right Stairs / Additional Locomotion Stairs: Yes Stairs Assistance: Minimal Assistance - Patient > 75% Stair Management Technique: One rail Left Number of Stairs: 12 Height of Stairs: 6 Wheelchair Mobility Wheelchair Mobility: Yes Wheelchair Assistance: Supervision/Verbal cueing Wheelchair Propulsion: Left upper extremity;Left lower extremity Wheelchair Parts Management: Supervision/cueing Distance: 150 ft  Trunk/Postural Assessment  Cervical Assessment Cervical Assessment: Within Functional Limits Thoracic Assessment Thoracic Assessment: Within Functional Limits Lumbar Assessment Lumbar Assessment: Within Functional Limits  Balance Balance Balance Assessed: Yes Standardized Balance Assessment Standardized Balance Assessment: Berg Balance Test Berg Balance Test Sit to Stand: Able to stand  independently using hands Standing Unsupported: Able to stand 2  minutes with supervision Sitting with Back Unsupported but Feet Supported on Floor or Stool: Able to sit safely and securely 2 minutes Stand to Sit: Sits safely with minimal use of hands Transfers: Able to transfer with verbal cueing and /or supervision Standing Unsupported with Eyes Closed: Able to stand 10 seconds with supervision Standing Ubsupported with Feet Together: Needs help to attain position and unable to hold for 15 seconds From Standing, Reach Forward with Outstretched Arm: Loses balance while trying/requires external support From Standing Position, Pick up Object from Floor: Unable to try/needs assist to keep balance From Standing Position, Turn to Look Behind Over each Shoulder: Needs assist to keep from losing balance and falling Turn 360 Degrees: Needs assistance while turning Standing Unsupported, Alternately Place Feet on Step/Stool: Needs assistance to keep from falling or unable to try Standing Unsupported, One Foot in Front: Needs help to step but can hold 15 seconds Standing on One Leg: Able to lift leg independently and hold equal to or more than 3 seconds Total Score: 22 Extremity Assessment  RUE Assessment RUE Assessment: Exceptions to [facility] General Strength Comments: Brunnstrum stage III in the arm and stage II in the hand.  Increased flexor tone is noted in the digits of the hand with no active movement noted to command.  He can elicit synergy pattern at the shoulder and elbow but needs max assist to integrate into function as a gross assist.  He demonstrates increased flexor tone in the elbow, wrist, and thumb especially with increased tone in the pectoral as well. LUE Assessment LUE Assessment: Within Functional Limits RLE Assessment RLE Assessment: Exceptions to [facility] Passive Range of Motion (PROM) Comments: DF to neutral General Strength Comments: impaired, hemiparesis RLE Strength Right Hip Flexion: 2+/5 Right Hip Extension: 3+/5 Right Knee Flexion: 2/5 Right  Knee Extension: 3+/5 Right Ankle Dorsiflexion: 0/5 Right Ankle Plantar Flexion: 0/5 LLE Assessment LLE Assessment: Within Functional Limits    Netta Corrigan, PT, DPT 03/19/2019, 1:01 PM

## 2019-03-19 NOTE — Progress Notes (Signed)
Social Work Patient ID: Casey Petty, male   DOB: 1961-06-09, 58 y.o.   MRN: [id number] Pt feels ready to go home tomorrow, he feels he can do for himself and will have his girlfriend there. Informed of PCP appointment has been there before a few years ago then stopped going. Discussed the importance of going to the MD and taking his medications. Have follow up arranged and equipment coming to his room prior to DC tomorrow.

## 2019-03-19 NOTE — Progress Notes (Signed)
[place] PHYSICAL MEDICINE & REHABILITATION PROGRESS NOTE   Subjective/Complaints: No issues overnite , pt is propelling WC to OT   ROS: Patient without , nausea, vomiting, diarrhea, cough, shortness of breath or chest pain, joint or back pain,  Objective:   No results found. Recent Labs    03/18/19 1123  WBC 10.2  HGB 15.2  HCT 46.2  PLT 278   Recent Labs    03/18/19 1123  NA 135  K 4.1  CL 102  CO2 18*  GLUCOSE 203*  BUN 16  CREATININE 1.02  CALCIUM 9.7    Intake/Output Summary (Last 24 hours) at 03/19/2019 0936 Last data filed at 03/19/2019 0843 Gross per 24 hour  Intake 560 ml  Output 1150 ml  Net -590 ml     Physical Exam: Vital Signs Blood pressure (!) 145/84, pulse 71, temperature 98.1 F (36.7 C), temperature source Oral, resp. rate 18, height 5\' 7"  (1.702 m), weight 72.3 kg, SpO2 98 %. Constitutional: No distress . Vital signs reviewed. HEENT: EOMI, oral membranes moist Neck: supple Cardiovascular: RRR without murmur. No JVD    Respiratory: CTA Bilaterally without wheezes or rales. Normal effort    GI: BS +, non-tender, non-distended  Psych: Impulsive Musc: No edema.  No tenderness. Neurologic: Alert. Speech dysarthric. A little disinhibited Motor: LUE/LLE: Grossly 5/5 RUE: Shoulder abduction 2-/5, elbow flexion/extension 2/5, handgrip 0/5, stable exam Right lower extremity: Hip flexion, knee extension 4/5, ankle dorsiflexion 0/5, stable, synergistic patterns --stable Increase in tone noted RUE/RLE  Assessment/Plan: 1. 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  Physiatrist is providing close team supervision and 24 hour management of active medical problems listed below.  Physiatrist and rehab team continue to assess barriers to discharge/monitor patient progress toward functional and medical goals  Care Tool:  Bathing   Body parts bathed by patient: Right arm, Chest, Abdomen, Front perineal area, Buttocks, Right upper leg, Left upper leg, Left lower leg, Face, Left arm, Right lower leg   Body parts bathed by helper: Left arm, Right lower leg     Bathing assist Assist Level: Minimal Assistance - Patient > 75%     Upper Body Dressing/Undressing Upper body dressing   What is the patient wearing?: Pull over shirt    Upper body assist Assist Level: Supervision/Verbal cueing    Lower Body Dressing/Undressing Lower body dressing      What is the patient wearing?: Pants, Underwear/pull up     Lower body assist Assist for lower body dressing: Contact Guard/Touching assist     Toileting Toileting    Toileting assist Assist for toileting: Moderate Assistance - Patient 50 - 74%     Transfers Chair/bed transfer  Transfers assist  Chair/bed transfer activity did not occur: Safety/medical concerns  Chair/bed transfer assist level: Minimal Assistance - Patient > 75%     Locomotion Ambulation   Ambulation assist      Assist level: Contact Guard/Touching assist Assistive device: Walker-rolling Max distance: 80 ft   Walk 10 feet activity   Assist     Assist level: Contact Guard/Touching assist Assistive device: Walker-rolling   Walk 50 feet activity   Assist Walk 50 feet with 2 turns activity did not occur: Safety/medical concerns  Assist level: Contact Guard/Touching assist Assistive device: Walker-rolling    Walk 150 feet activity   Assist Walk 150 feet activity did not occur: Safety/medical concerns  Assist  level: Moderate Assistance - Patient - 50 - 74% Assistive device: Lite Gait    Walk 10 feet on uneven surface  activity   Assist Walk 10 feet on uneven surfaces activity did not occur: Safety/medical concerns         Wheelchair     Assist Will patient use wheelchair at discharge?: Yes Type of Wheelchair: Manual    Wheelchair assist level: Set up  assist, Supervision/Verbal cueing Max wheelchair distance: 13ft    Wheelchair 50 feet with 2 turns activity    Assist        Assist Level: Set up assist, Supervision/Verbal cueing   Wheelchair 150 feet activity     Assist     Assist Level: Supervision/Verbal cueing    Medical Problem List and Plan: 1.Right hemiparesis, now with spasticity and functional deficitssecondary to left subcortical lacunar infarct.   Continue CIR PT, OT,  SLP, team conf in am   Pt states AFO is helping   Baclofen 5 twice daily started on 8/7--increased to TID 8/8- per OT still with RIght elbow flexor tone  2. Antithrombotics: -DVT/anticoagulation:Pharmaceutical:Lovenox -antiplatelet therapy: ASA/Plavix x3 weeks followed by ASA alone 3. Pain Management:N/A 4. Mood:LCSW to follow for evaluation and support -antipsychotic agents: N/A 5. Neuropsych: This patientiscapable of making decisions on hisown behalf.may need neuropsych for adjustment    remains a little disinhibited  -poor insight    6. Skin/Wound Care:Routine pressure re ief measures. Maintain adequate nutrition and hydration status 7. Fluids/Electrolytes/Nutrition:Monitor I/O.   8. HTN: Monitor BP bid. Continue Norvasc 10mg  daily ,Coreg 6.25mg  BID   Hydralazine 10 3 times daily started on 8/2, increased to 25 3 times daily on 8/3, increased again on 8/4, increased again on 8/5, increased on 8/7 Vitals:   03/18/19 1956 03/19/19 0559  BP: 136/75 (!) 145/84  Pulse: 79 71  Resp: 18 18  Temp: 98.7 F (37.1 C) 98.1 F (36.7 C)  SpO2: 98% 98%  may be able to increase Coreg as rates still>70 bpm , increase to 12.5 mg monitor for bradycardia or hypotension  9. Dyslipidemia: On Lipitor.  10. Acute angioedema (face/lips): Resolved with d/c ACE and IV solumedrol.  Resumed regular texturefoods without issue. 11.Leucocytosis:  Resolved   WBCs 11.9 on 8/3, 10.2 on 8/10   Afebrile 12.   Constipation: Continue to monitor 13.  New diagnosis of diabetes mellitus with hyperglycemia  Hemoglobin A1c 7.9 on 8/4  Diabetic education ongoing  Diet changed to Carb modified - anticipate continued noncompliance esp at discharge  Metformin increased to 500 twice daily on 8/7, CBG (last 3)  Recent Labs    03/18/19 1630 03/18/19 2110 03/19/19 0616  GLUCAP 143* 188* 135*  Controlled 8/11 15 days A FACE TO FACE EVALUATION WAS PERFORMED  Luanna Salk Neeraj Housand 03/19/2019, 9:36 AM

## 2019-03-20 DIAGNOSIS — E119 Type 2 diabetes mellitus without complications: Secondary | ICD-10-CM

## 2019-03-20 DIAGNOSIS — G8110 Spastic hemiplegia affecting unspecified side: Secondary | ICD-10-CM

## 2019-03-20 DIAGNOSIS — I10 Essential (primary) hypertension: Secondary | ICD-10-CM

## 2019-03-20 DIAGNOSIS — E1165 Type 2 diabetes mellitus with hyperglycemia: Secondary | ICD-10-CM

## 2019-03-20 LAB — GLUCOSE, CAPILLARY: Glucose-Capillary: 183 mg/dL — ABNORMAL HIGH (ref 70–99)

## 2019-03-20 MED ORDER — ASPIRIN 81 MG PO TBEC: 81.0000 mg | DELAYED_RELEASE_TABLET | Freq: Every day | ORAL | 0 refills | Status: DC

## 2019-03-20 MED ORDER — AMLODIPINE BESYLATE 10 MG PO TABS: 10.0000 mg | ORAL_TABLET | Freq: Every day | ORAL | 0 refills | Status: DC

## 2019-03-20 MED ORDER — CLOPIDOGREL BISULFATE 75 MG PO TABS: 75.0000 mg | ORAL_TABLET | Freq: Every day | ORAL | 0 refills | Status: DC

## 2019-03-20 MED ORDER — CARVEDILOL 12.5 MG PO TABS: 12.5000 mg | ORAL_TABLET | Freq: Two times a day (BID) | ORAL | 0 refills | Status: DC

## 2019-03-20 MED ORDER — BACLOFEN 5 MG PO TABS: 5.0000 mg | ORAL_TABLET | Freq: Three times a day (TID) | ORAL | 0 refills | Status: DC

## 2019-03-20 MED ORDER — FAMOTIDINE 20 MG PO TABS: 20.0000 mg | ORAL_TABLET | Freq: Every day | ORAL | 0 refills | Status: DC

## 2019-03-20 MED ORDER — HYDRALAZINE HCL 100 MG PO TABS: 100.0000 mg | ORAL_TABLET | Freq: Three times a day (TID) | ORAL | 0 refills | Status: DC

## 2019-03-20 MED ORDER — NICOTINE 21 MG/24HR TD PT24: 21.0000 mg | MEDICATED_PATCH | Freq: Every day | TRANSDERMAL | 0 refills | Status: DC

## 2019-03-20 MED ORDER — ATORVASTATIN CALCIUM 80 MG PO TABS: 80.0000 mg | ORAL_TABLET | Freq: Every day | ORAL | 0 refills | Status: DC

## 2019-03-20 MED ORDER — CLOPIDOGREL BISULFATE 75 MG PO TABS: 75.0000 mg | ORAL_TABLET | Freq: Every day | ORAL | 0 refills | Status: AC

## 2019-03-20 MED ORDER — METFORMIN HCL 850 MG PO TABS: 850.0000 mg | ORAL_TABLET | Freq: Two times a day (BID) | ORAL | 0 refills | Status: DC

## 2019-03-20 MED ORDER — ACETAMINOPHEN 325 MG PO TABS: 325.0000 mg | ORAL_TABLET | ORAL | Status: DC | PRN

## 2019-03-20 MED FILL — metFORMIN HCL 850 MG TABS: 850 | 30 days supply | Qty: 60 | Fill #0

## 2019-03-20 MED FILL — CARVEDILOL 12.5 MG TABLET: 12.5 | 30 days supply | Qty: 60 | Fill #0

## 2019-03-20 MED FILL — ATORVASTATIN 80 MG TABLET: 80 | 30 days supply | Qty: 30 | Fill #0

## 2019-03-20 MED FILL — BACLOFEN 5 MG TABS: 5 | 30 days supply | Qty: 90 | Fill #0

## 2019-03-20 MED FILL — hydrALAZINE HCL 100 MG TABS: 100 | 30 days supply | Qty: 90 | Fill #0

## 2019-03-20 MED FILL — NICOTINE 21 MG/24HR PATCH: 21 | 28 days supply | Qty: 28 | Fill #0

## 2019-03-20 MED FILL — AMLODIPINE BESYLATE 10 MG T: 10 | 30 days supply | Qty: 30 | Fill #0

## 2019-03-20 MED FILL — CLOPIDOGREL 75 MG TABLET: 75 | 3 days supply | Qty: 3 | Fill #0

## 2019-03-20 NOTE — Progress Notes (Signed)
Pt d/c home with personal property. D/C instructions provided to pt and son by P. Love. Pt and son verbalized an understanding of d/c instructions, medications, and follow up appt.

## 2019-03-20 NOTE — Discharge Summary (Signed)
Physician Discharge Summary  Patient ID: Casey Petty MRN: [id number] DOB/AGE: 1961-01-19 58 y.o.  Admit date: 03/04/2019 Discharge date: 03/20/2019  Discharge Diagnoses:  Principal Problem:   Basal ganglia stroke [facility]) Active Problems:   Leucocytosis   Right hemiparesis (HCC)   Benign essential HTN   Uncontrolled type 2 diabetes mellitus with hyperglycemia (HCC)   Noncompliance   Spastic hemiparesis (HCC)   Discharged Condition: stable   Significant Diagnostic Studies: N/A   Labs:  Basic Metabolic Panel: BMP Latest Ref Rng & Units 03/18/2019 03/11/2019 03/05/2019  Glucose 70 - 99 mg/dL 203(H) 198(H) 151(H)  BUN 6 - 20 mg/dL 16 15 19   Creatinine 0.61 - 1.24 mg/dL 1.02 1.05 1.18  Sodium 135 - 145 mmol/L 135 137 138  Potassium 3.5 - 5.1 mmol/L 4.1 4.2 4.3  Chloride 98 - 111 mmol/L 102 102 100  CO2 22 - 32 mmol/L 18(L) 26 28  Calcium 8.9 - 10.3 mg/dL 9.7 9.8 9.6    CBC: CBC Latest Ref Rng & Units 03/18/2019 03/11/2019 03/05/2019  WBC 4.0 - 10.5 K/uL 10.2 11.9(H) 20.6(H)  Hemoglobin 13.0 - 17.0 g/dL 15.2 15.5 15.3  Hematocrit 39.0 - 52.0 % 46.2 45.7 46.3  Platelets 150 - 400 K/uL 278 241 222    CBG: Recent Labs  Lab 03/19/19 0616 03/19/19 1206 03/19/19 1629 03/19/19 2052 03/20/19 0626  GLUCAP 135* 147* 159* 151* 183*    Brief HPI:   Casey Petty is a 58 year old male with history of HTN--no medicine 3 years who was admitted on 03/01/2019 with 2-day history of right-sided weakness with RLE pain, slurred speech and difficulty walking.  Work-up revealed 13 mm acute ischemic infarct left internal capsule and possible punctate acute/early subacute ischemic infarct in contralateral right internal capsule.  2D echo showed mild concentric LVH with EF > 60%.  Dr. [name] felt the stroke was due to small vessel disease and recommended aspirin/Plavix for 3 weeks followed by aspirin alone.  His blood pressures continue to be labile and medications were in process of being titrated.  He  did develop angioedema with addition of lisinopril therefore this was discontinued and he was started on IV Solu-Medrol with improvement in symptoms.  Patient with resultant anarthria, dysarthria with mild receptive deficits and left-sided weakness affecting mobility and ADLs.  CIR was recommended due to functional decline   Hospital Course: Casey Petty was admitted to rehab 03/04/2019 for inpatient therapies to consist of PT, ST and OT at least three hours five days a week. Past admission physiatrist, therapy team and rehab RN have worked together to provide customized collaborative inpatient rehab. He was maintained on IV Solu-Medrol for additional 24 hours and face and lip edema has resolved.  Diet was upgraded to regular textures and he was tolerating this without difficulty.  Diabetes has been monitored with AC at bedtime CBG checks.  Diet was modified to carb modified and he has been educated on appropriate diet.  Metformin was added and titrated upwards to 850 mg twice daily with improvement in BS control.  Anticipate that he will need further titration of oral hypoglycemic regimen after discharge.  He continues on aspirin Plavix for stroke prophylaxis--to discontinue Plavix after 3 days.  Platelets are stable.  Reactive leukocytosis likely due to steroids has resolved and no signs of infection noted.     He is continent of bowel and bladder.  Blood pressures have been monitored on twice daily basis and were noted to be labile.  Hydralazine was added and titrated  upwards for tighter control.  Heart rate has been monitored on twice daily basis and Coreg was increased to 12.5 mg to manage tachycardia.  His p.o. intake has been good.  Disinhibition has improved and mood is overall stable.  His impulsivity and safety awareness is improving.  He has been educated extensively on medication compliance, importance of tobacco cessation as well as importance of medical follow-up after discharge.  He was fitted  with R-AFO for right foot drop and toe cap added to help prevent toe drag.  He has progressed to supervision to [facility]  and will continue to receive further follow-up Home Health PT, OT, RN and ST by [facility] home after discharge.     Rehab course: During patient's stay in rehab weekly team conferences were held to monitor patient's progress, set goals and discuss barriers to discharge. At admission, patient required mod assist with mobility and with basic ADL tasks.  He exhibited moderate dysarthria impacting speech intelligibility to 50%, prolonged mastication due to right oral motor weakness and exhibited mild to moderate cognitive deficits affecting attention, functional problem-solving, recall and awareness.  He  has had improvement in activity tolerance, balance, postural control as well as ability to compensate for deficits. He has had improvement in functional use RUE  and RLE as well as improvement in awareness. He is able to perform bathing and dressing with supervision. He requires min assist for shower transfer and min cues to complete hemi dressing after set up. He requires mod assist to done right shoe and AFO.  He is requires supervision for transfers and is able to ambulate 220' with CGA and use of RW.  He requires mod assist to supervision for cognitive and speech skills.  He requires min assist to slow rate of speech for intelligibility.  He is tolerating regular textures thin liquids at modified independent level.  Family education was completed with girlfriend.   Disposition: Home  Diet: Carb modified/heart healthy  Special Instructions: 1.  No driving or strenuous activity till cleared by MD 2.  Needs supervision with all activity.    Allergies as of 03/20/2019      Reactions   Lisinopril Swelling   Angioedema- lips/face      Medication List    STOP taking these medications   diphenhydrAMINE 25 MG tablet Commonly known as: BENADRYL   predniSONE 10 MG tablet Commonly  known as: DELTASONE     TAKE these medications   acetaminophen 325 MG tablet Commonly known as: TYLENOL Take 1-2 tablets (325-650 mg total) by mouth every 4 (four) hours as needed for mild pain. What changed:   how much to take  reasons to take this   amLODipine 10 MG tablet Commonly known as: NORVASC Take 1 tablet (10 mg total) by mouth daily.   aspirin 81 MG EC tablet Take 1 tablet (81 mg total) by mouth daily. Notes to patient: You have to purchase this over the counter. This will be your blood thinner to prevent strokes   atorvastatin 80 MG tablet Commonly known as: LIPITOR Take 1 tablet (80 mg total) by mouth daily at 6 PM.   Baclofen 5 MG Tabs Take 5 mg by mouth 3 (three) times daily.   carvedilol 12.5 MG tablet Commonly known as: COREG Take 1 tablet (12.5 mg total) by mouth 2 (two) times daily with a meal. What changed:   medication strength  how much to take   clopidogrel 75 MG tablet Commonly known as: PLAVIX Take 1  tablet (75 mg total) by mouth daily for 3 days. Notes to patient: You only need 3 more days of this medication.    famotidine 20 MG tablet Commonly known as: PEPCID Take 1 tablet (20 mg total) by mouth daily.   hydrALAZINE 100 MG tablet Commonly known as: APRESOLINE Take 1 tablet (100 mg total) by mouth every 8 (eight) hours.   metFORMIN 850 MG tablet Commonly known as: GLUCOPHAGE Take 1 tablet (850 mg total) by mouth 2 (two) times daily with a meal.   nicotine 21 mg/24hr patch Commonly known as: NICODERM CQ - dosed in mg/24 hours Place 1 patch (21 mg total) onto the skin daily.      Follow-up [facility] Follow up on 03/27/2019.   Why: APPOINTMENT @ 2:30 PM Contact information: [place] [address] [phone]       [name], MD Follow up.   Specialty: Physical Medicine and Rehabilitation Why: Office will call you with follow up  appointment Contact information: [place] [address] [phone]        [facility]. Call.   Why: for post stroke follow up Contact information: [address]     [place] [address] [phone]          Signed: [name] 03/20/2019, 5:53 PM

## 2019-03-20 NOTE — Progress Notes (Signed)
[place] PHYSICAL MEDICINE & REHABILITATION PROGRESS NOTE   Subjective/Complaints:  Family coming to pick pt up.  Discussed no driving   ROS: Patient without , nausea, vomiting, diarrhea, cough, shortness of breath or chest pain, joint or back pain,  Objective:   No results found. Recent Labs    03/18/19 1123  WBC 10.2  HGB 15.2  HCT 46.2  PLT 278   Recent Labs    03/18/19 1123  NA 135  K 4.1  CL 102  CO2 18*  GLUCOSE 203*  BUN 16  CREATININE 1.02  CALCIUM 9.7    Intake/Output Summary (Last 24 hours) at 03/20/2019 0854 Last data filed at 03/20/2019 0516 Gross per 24 hour  Intake 499 ml  Output 925 ml  Net -426 ml     Physical Exam: Vital Signs Blood pressure 135/80, pulse 76, temperature 98.2 F (36.8 C), temperature source Oral, resp. rate 18, height 5\' 7"  (1.702 m), weight 72.3 kg, SpO2 96 %. Constitutional: No distress . Vital signs reviewed. HEENT: EOMI, oral membranes moist Neck: supple Cardiovascular: RRR without murmur. No JVD    Respiratory: CTA Bilaterally without wheezes or rales. Normal effort    GI: BS +, non-tender, non-distended  Psych: Impulsive Musc: No edema.  No tenderness. Neurologic: Alert. Speech dysarthric. A little disinhibited Motor: LUE/LLE: Grossly 5/5 RUE: Shoulder abduction 2-/5, elbow flexion/extension 2/5, handgrip 0/5, stable exam Right lower extremity: Hip flexion, knee extension 4/5, ankle dorsiflexion 0/5, stable, synergistic patterns --stable Increase in tone noted RUE/RLE  Assessment/Plan: 1. 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  Physiatrist is providing close team supervision and 24 hour management of active medical problems listed below.  Physiatrist and rehab team continue to assess barriers to discharge/monitor patient progress toward functional and medical goals  Care Tool:  Bathing     Body parts bathed by patient: Right arm, Left arm, Chest, Abdomen, Front perineal area, Right upper leg, Face, Left lower leg, Right lower leg, Left upper leg   Body parts bathed by helper: Left arm, Right lower leg     Bathing assist Assist Level: Contact Guard/Touching assist     Upper Body Dressing/Undressing Upper body dressing   What is the patient wearing?: Pull over shirt    Upper body assist Assist Level: Set up assist    Lower Body Dressing/Undressing Lower body dressing      What is the patient wearing?: Underwear/pull up, Pants     Lower body assist Assist for lower body dressing: Contact Guard/Touching assist     Toileting Toileting    Toileting assist Assist for toileting: Contact Guard/Touching assist     Transfers Chair/bed transfer  Transfers assist  Chair/bed transfer activity did not occur: Safety/medical concerns  Chair/bed transfer assist level: Supervision/Verbal cueing     Locomotion Ambulation   Ambulation assist      Assist level: Contact Guard/Touching assist Assistive device: Walker-rolling Max distance: 26ft   Walk 10 feet activity   Assist     Assist level: Contact Guard/Touching assist Assistive device: Walker-rolling   Walk 50 feet activity   Assist Walk 50 feet with 2 turns activity did not occur: Safety/medical concerns  Assist level: Contact Guard/Touching assist Assistive device: Walker-rolling    Walk 150 feet activity   Assist Walk 150 feet activity did not occur: Safety/medical concerns  Assist level: Contact Guard/Touching assist Assistive device: Walker-rolling  Walk 10 feet on uneven surface  activity   Assist Walk 10 feet on uneven surfaces activity did not occur: Safety/medical concerns         Wheelchair     Assist Will patient use wheelchair at discharge?: Yes Type of Wheelchair: Manual    Wheelchair assist level: Supervision/Verbal cueing Max wheelchair distance: 133ft     Wheelchair 50 feet with 2 turns activity    Assist        Assist Level: Supervision/Verbal cueing   Wheelchair 150 feet activity     Assist     Assist Level: Supervision/Verbal cueing    Medical Problem List and Plan: 1.Right hemiparesis, now with spasticity and functional deficitssecondary to left subcortical lacunar infarct.   Ready for D/C today   2. Antithrombotics: -DVT/anticoagulation:Pharmaceutical:Lovenox -antiplatelet therapy: ASA/Plavix x3 weeks followed by ASA alone- ~8/16 3. Pain Management:N/A 4. Mood:LCSW to follow for evaluation and support -antipsychotic agents: N/A 5. Neuropsych: This patientiscapable of making decisions on hisown behalf.may need neuropsych for adjustment    remains a little disinhibited  -poor insight    6. Skin/Wound Care:Routine pressure re ief measures. Maintain adequate nutrition and hydration status 7. Fluids/Electrolytes/Nutrition:Monitor I/O.   8. HTN: Monitor BP bid. Continue Norvasc 10mg  daily ,Coreg 6.25mg  BID   Hydralazine 10 3 times daily started on 8/2, increased to 25 3 times daily on 8/3, increased again on 8/4, increased again on 8/5, increased on 8/7 Vitals:   03/19/19 2039 03/20/19 0515  BP: 136/80 135/80  Pulse: 76 76  Resp: 16 18  Temp: 98 F (36.7 C) 98.2 F (36.8 C)  SpO2: 98% 96%  Coreg , increased to 12.5 mg monitor for bradycardia or hypotension  9. Dyslipidemia: On Lipitor.  10. Acute angioedema (face/lips): Resolved with d/c ACE and IV solumedrol.  Resumed regular texturefoods without issue. 11.Leucocytosis:  Resolved   WBCs 11.9 on 8/3, 10.2 on 8/10   Afebrile 12.  Constipation: Continue to monitor 13.  New diagnosis of diabetes mellitus with hyperglycemia  Hemoglobin A1c 7.9 on 8/4  Diabetic education ongoing  Diet changed to Carb modified - anticipate continued noncompliance esp at discharge  Metformin increased to 500 twice daily on 8/7, CBG  (last 3)  Recent Labs    03/19/19 1629 03/19/19 2052 03/20/19 0626  GLUCAP 159* 151* 183*  Controlled 8/12- did eat an entire bunch of grapes since yesterday  16 days A FACE TO Savannah E Conswella Bruney 03/20/2019, 8:54 AM

## 2019-03-20 NOTE — Discharge Instructions (Signed)
Inpatient Rehab Discharge Instructions  Casey Petty Discharge date and time: 03/20/19   Activities/Precautions/ Functional Status: Activity: no lifting, driving, or strenuous exercise till cleared MD Diet: cardiac diet and diabetic diet Wound Care: none needed   Functional status:  ___ No restrictions     ___ Walk up steps independently _X__ 24/7 supervision/assistance   ___ Walk up steps with assistance ___ Intermittent supervision/assistance  ___ Bathe/dress independently ___ Walk with walker     ___ Bathe/dress with assistance ___ Walk Independently    ___ Shower independently ___ Walk with assistance    _X__ Shower with assistance _X__ No alcohol     ___ Return to work/school ________   Special Instructions:  COMMUNITY REFERRALS UPON DISCHARGE:    Home Health:   PT, OT, SP, RN     Agency:[facility] Phone: [phone]   Date of last service:03/20/2019  Medical Equipment/Items Ordered:WHEELCHAIR, Vassie Moselle, 3 IN 1 & TUB BENCH  Agency/Supplier:[facility]   [phone]  OTHER: [facility] SET UP FOR 8/19 @ 2:30 pm    My questions have been answered and I understand these instructions. I will adhere to these goals and the provided educational materials after my discharge from the hospital.  Patient/Caregiver Signature _______________________________ Date __________  Clinician Signature _______________________________________ Date __________  Please bring this form and your medication list with you to all your follow-up doctor's appointments.

## 2019-03-25 ENCOUNTER — Telehealth: Payer: Self-pay

## 2019-03-25 NOTE — Telephone Encounter (Signed)
  TRANSITIONAL CARE CALL attempted, no answer on any line, left message to return call  Patient Name: Casey Petty DOB: 1961-04-29 Appointment Date and Time: 04-04-2019 / 940am With: Zella Ball then Dr. Posey Pronto  Questions for our staff to ask patients on Transitional care 48 hour phone call:   1. Are you/is patient experiencing any problems since coming home? Are there any questions regarding any aspect of care?   2. Are there any questions regarding medications administration/dosing? Are meds being taken as prescribed? Patient should review meds with caller to confirm   3. Have there been any falls?   4. Has Home Health been to the house and/or have they contacted you? If not, have you tried to contact them? Can we help you contact them?   5. Are bowels and bladder emptying properly? Are there any unexpected incontinence issues? If applicable, is patient following bowel/bladder programs?   6. Any fevers, problems with breathing, unexpected pain?   7. Are there any skin problems or new areas of breakdown?   8. Has the patient/family member arranged specialty MD follow up (ie cardiology/neurology/renal/surgical/etc)? Can we help arrange?   9. Does the patient need any other services or support that we can help arrange?   10. Are caregivers following through as expected in assisting the patient?   11. Has the patient quit smoking, drinking alcohol, or using drugs as recommended?   Cass Richey [phone]

## 2019-03-25 NOTE — Progress Notes (Signed)
Patient ID: Casey Petty, male   DOB: 21-Oct-1960, 58 y.o.   MRN: [id number]  Virtual Visit via Telephone Note  I connected with Casey Petty on 03/27/19 at  2:30 PM EDT by telephone and verified that I am speaking with the correct person using two identifiers.   I discussed the limitations, risks, security and privacy concerns of performing an evaluation and management service by telephone and the availability of in person appointments. I also discussed with the patient that there may be a patient responsible charge related to this service. The patient expressed understanding and agreed to proceed.  Patient location:  home My Location:  [facility] office Persons on the call:  Me and the patient  History of Present Illness: After hospitalization 7/23-7/27/2020 for stroke. Subsequent to this, he was in rehab medicine from 03/04/2019-03/20/2019.  Lives with fiancee.  Doing well.  Performing ADL w/o assistance.  No concerns or complaints today.  Blood sugars <150.  Compliant with medications.  Feels as though he is getting stronger each day.     From discharge summary: History of present illness:   58 y.o.male,w hypertension, presents with c/o right leg pain/weakness starting 5pm Wednesday. Pt denies numbness, tingling. Pt denies headache, vision change, slurred speech, cp, palp, sob, n/v, abd pain, diarrhea, brbpr, dysuria, hematuria. Pt is not currently on bp medication. He lives with his wife.  Patient was admitted with right-sided weakness secondary to acute lacunar subcortical CVA on the left side. He has been started on dual antiplatelet therapy with carotid Dopplers, echocardiogram, and PT/OT evaluation. Neurology followed.  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 Patient was off BP medications for over 3 years. MRI shows41mm acute ischemic infarct involving the  posterior limb of the left internal capsule. Resultant dysarthria and right hemiparesis. Echo shows LVEF 65%, A1c, LDL PT recommending CIR. Continue DAPT for 3 weeks then ASA along.dispo:P rehab  Hypertensionurgency.Permissive hypertension was allowed, discussed with neurology and recommended to start normalizing blood pressure slowly. Patient currently on amlodipine 10 mg. BP in the high and, neurology added lisinopril 20 mg daily.then developed mild angioedema. No stridor, no change in voice but mild lip, facial swelling. Improved.Due to angioedema will discontinue lisinopril.Started on coreg, cont amlodipine, and titrate as needed at rehab   Acute angioedema secondary to lisinopril.Improved.Medication discontinued. No crowding of pharynx or stridor on examination. Cont  prednisone for 2 days,. H1/h2 blockers   Hyperlipidemia LDL goal less than 70. Patient currently on Lipitor 80 mg daily. Continue at discharge.  Tobacco abuse Currently on nicotine patch 21 mg daily. Smoking cessation discussed.  Leukocytosislikely due to steroids. No s/s of infection  Mild hyperglycemia. ? Due to steroids. Check ha1c at rehab to r/o underlying DM   Procedures:  Echo  (i.e. 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  Work-up revealed 13 mm acute ischemic infarct left internal capsule and  possible punctate  acute/early subacute ischemic infarct in contralateral right internal capsule.  2D echo showed mild concentric LVH with EF > 60%.  Dr. [name] felt the stroke was due to small vessel disease and recommended aspirin/Plavix for 3 weeks followed by aspirin alone.  His blood pressures continue to be labile and medications were in process of being titrated.  He did develop angioedema with addition of lisinopril therefore this was discontinued and he was started on IV Solu-Medrol with improvement in symptoms.  Patient with resultant anarthria, dysarthria with mild receptive deficits and left-sided weakness affecting mobility and ADLs.  CIR was recommended due to functional decline   Hospital Course: Casey Petty was admitted to rehab 03/04/2019 for inpatient therapies to consist of PT, ST and OT at least three hours five days a week. Past admission physiatrist, therapy team and rehab RN have worked together to provide customized collaborative inpatient rehab. He was maintained on IV Solu-Medrol for additional 24 hours and face and lip edema has resolved.  Diet was upgraded to regular textures and he was tolerating this without difficulty.  Diabetes has been monitored with AC at bedtime CBG checks.  Diet was modified to carb modified and he has been educated on appropriate diet.  Metformin was added and titrated upwards to 850 mg twice daily with improvement in BS control.  Anticipate that he will need further titration of oral hypoglycemic regimen after discharge.  He continues on aspirin Plavix for stroke prophylaxis--to discontinue Plavix after 3 days.  Platelets are stable.  Reactive leukocytosis likely due to steroids has resolved and no signs of infection noted.     He is continent of bowel and bladder.  Blood pressures have been monitored on twice daily basis and were noted to be labile.  Hydralazine was added and titrated upwards for tighter control.  Heart rate has been monitored on twice daily basis and  Coreg was increased to 12.5 mg to manage tachycardia.  His p.o. intake has been good.  Disinhibition has improved and mood is overall stable.  His impulsivity and safety awareness is improving.  He has been educated extensively on medication compliance, importance of tobacco cessation as well as importance of medical follow-up after discharge.  He was fitted with R-AFO for right foot drop and toe cap added to help prevent toe drag.  He has progressed to supervision to [facility]  and will continue to receive further follow-up Home Health PT, OT, RN and ST by [facility] at home after discharge.     Rehab course: During patient's stay in rehab weekly team conferences were held to monitor patient's progress, set goals and discuss barriers to discharge. At admission, patient required mod assist with mobility and with basic ADL tasks.  He exhibited moderate dysarthria impacting speech intelligibility to 50%, prolonged mastication due to right oral motor weakness and exhibited mild to moderate cognitive deficits affecting attention, functional problem-solving, recall and awareness.  He  has had improvement in activity tolerance, balance, postural control as well as ability to compensate for deficits. He has had improvement in functional use RUE  and RLE as well as improvement in awareness. He is able to perform bathing and dressing with supervision. He requires min assist for shower transfer and min cues to complete hemi dressing after set up. He requires mod assist to done right shoe and AFO.  He is requires supervision for transfers and is able to ambulate 220' with CGA and use of RW.  He requires mod assist to  supervision for cognitive and speech skills.  He requires min assist to slow rate of speech for intelligibility.  He is tolerating regular textures thin liquids at modified independent level.  Family education was completed with girlfriend.   Disposition: Home  Diet: Carb modified/heart healthy  Special  Instructions: 1.  No driving or strenuous activity till cleared by MD 2.  Needs supervision with all activity.     Observations/Objective:  A&Ox3   Assessment and Plan: 1. Uncontrolled type 2 diabetes mellitus with hyperglycemia (HCC) Improving-work on diet-continue current meds - metFORMIN (GLUCOPHAGE) 850 MG tablet; Take 1 tablet (850 mg total) by mouth 2 (two) times daily with a meal.  Dispense: 60 tablet; Refill: 2  2. Spastic hemiparesis ([place]) F/up neurology  3. Benign essential HTN Controlled-continue current regimen - carvedilol (COREG) 12.5 MG tablet; Take 1 tablet (12.5 mg total) by mouth 2 (two) times daily with a meal.  Dispense: 60 tablet; Refill: 3 - hydrALAZINE (APRESOLINE) 100 MG tablet; Take 1 tablet (100 mg total) by mouth every 8 (eight) hours.  Dispense: 90 tablet; Refill: 2  4. 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  Dispense: 30 tablet; Refill: 3  5. Hospital discharge follow-up improving    Follow Up Instructions: 3-4 weeks for assign PCP   I discussed the assessment and treatment plan with the patient. The patient was provided an opportunity to ask questions and all were answered. The patient agreed with the plan and demonstrated an understanding of the instructions.   The patient was advised to call back or seek an in-person evaluation if the symptoms worsen or if the condition fails to improve as anticipated.  I provided 10 minutes of non-face-to-face time during this encounter.   Freeman Caldron, PA-C

## 2019-03-27 ENCOUNTER — Other Ambulatory Visit: Payer: Self-pay

## 2019-03-27 ENCOUNTER — Ambulatory Visit: Payer: Self-pay | Attending: Family Medicine | Admitting: Physician Assistant

## 2019-03-27 DIAGNOSIS — I10 Essential (primary) hypertension: Secondary | ICD-10-CM

## 2019-03-27 DIAGNOSIS — E1165 Type 2 diabetes mellitus with hyperglycemia: Secondary | ICD-10-CM

## 2019-03-27 DIAGNOSIS — Z09 Encounter for follow-up examination after completed treatment for conditions other than malignant neoplasm: Secondary | ICD-10-CM

## 2019-03-27 DIAGNOSIS — G8110 Spastic hemiplegia affecting unspecified side: Secondary | ICD-10-CM

## 2019-03-27 DIAGNOSIS — I639 Cerebral infarction, unspecified: Secondary | ICD-10-CM

## 2019-03-27 DIAGNOSIS — I6381 Other cerebral infarction due to occlusion or stenosis of small artery: Secondary | ICD-10-CM

## 2019-03-27 MED ORDER — ATORVASTATIN CALCIUM 80 MG PO TABS: 80.0000 mg | ORAL_TABLET | Freq: Every day | ORAL | 3 refills | Status: DC

## 2019-03-27 MED ORDER — AMLODIPINE BESYLATE 10 MG PO TABS: 10.0000 mg | ORAL_TABLET | Freq: Every day | ORAL | 3 refills | Status: DC

## 2019-03-27 MED ORDER — METFORMIN HCL 850 MG PO TABS: 850.0000 mg | ORAL_TABLET | Freq: Two times a day (BID) | ORAL | 2 refills | Status: DC

## 2019-03-27 MED ORDER — CARVEDILOL 12.5 MG PO TABS: 12.5000 mg | ORAL_TABLET | Freq: Two times a day (BID) | ORAL | 3 refills | Status: DC

## 2019-03-27 MED ORDER — HYDRALAZINE HCL 100 MG PO TABS: 100.0000 mg | ORAL_TABLET | Freq: Three times a day (TID) | ORAL | 2 refills | Status: DC

## 2019-03-27 NOTE — Progress Notes (Signed)
Patient verified DOB Patient has eaten today and patient has taken medication today. Patient denies pain at this time. Patients CBG 176 today after eating

## 2019-04-04 ENCOUNTER — Encounter: Payer: Medicaid Other | Attending: Registered Nurse | Admitting: Registered Nurse

## 2019-04-04 ENCOUNTER — Other Ambulatory Visit: Payer: Self-pay

## 2019-04-04 VITALS — BP 147/77 | HR 83 | Temp 98.5°F | Resp 16

## 2019-04-04 DIAGNOSIS — G8191 Hemiplegia, unspecified affecting right dominant side: Secondary | ICD-10-CM | POA: Diagnosis not present

## 2019-04-04 DIAGNOSIS — I639 Cerebral infarction, unspecified: Secondary | ICD-10-CM | POA: Diagnosis not present

## 2019-04-04 DIAGNOSIS — I6381 Other cerebral infarction due to occlusion or stenosis of small artery: Secondary | ICD-10-CM

## 2019-04-04 DIAGNOSIS — E1165 Type 2 diabetes mellitus with hyperglycemia: Secondary | ICD-10-CM | POA: Diagnosis not present

## 2019-04-04 DIAGNOSIS — I10 Essential (primary) hypertension: Secondary | ICD-10-CM

## 2019-04-04 NOTE — Progress Notes (Signed)
Subjective:    Patient ID: Casey Petty, male    DOB: 08-15-1960, 58 y.o.   MRN: [id number]  HPI: Afshin Scheumann is a 58 y.o. male who is here for hospital follow up visit of his basal ganglia stroke, right hemiparesis, hypertension and type 2 DM uncontrolled. Mr. Moreta arrived to [facility] with complaints of right leg weakness and pain. Neurology was consulted,, recommended aspirin and plavix for 3 weeks followed by ASA alone.  .  [name] Contrast: IMPRESSION: 1. Technically limited exam due to the patient's inability to tolerate the full length of the study as well as motion artifact. 2. 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 3. Additional possible punctate acute to early subacute ischemic infarct involving the posterior limb of the contralateral right internal capsule. MR Lumbar : IMPRESSION: 1. Mild degenerative disc bulging with facet hypertrophy at L5-S1 with resultant moderate bilateral L5 foraminal stenosis. Either of the exiting L5 nerve roots could be affected. 2. Disc bulging with moderate facet hypertrophy at L4-5 with resultant mild bilateral L4 foraminal stenosis. No frank impingement. 3. Mild-to-moderate multilevel facet hypertrophy throughout the lumbar spine, most pronounced at L4-5 where there is associated reactive marrow edema. Finding could contribute to underlying low back pain. DG Tibia and Fibula IMPRESSION: Negative radiographs of the right lower extremity.  Mr. Coby was admitted to inpatient rehabilitation on 03/04/2019 and discharged home on 03/20/2019. He hasn't heard from [facility], this provider sent a e-mail to social worker to follow up. Jacqlyn Larsen Dupree will call Ms Katharine Look Girlfriend, she verbalizes understanding,   He states he has pain in his right shoulder at night. He rates his pain at this time 0. Reports he has a good appetite.   Katharine Look Girlfriend in room, all  questions answered.  Mr. Stasiak arrived in wheelchair.    Pain Inventory Average Pain 0 Pain Right Now 0 My pain is na  In the last 24 hours, has pain interfered with the following? General activity 0 Relation with others 0 Enjoyment of life 0 What TIME of day is your pain at its worst? na Sleep (in general) Fair  Pain is worse with: walking Pain improves with: rest Relief from Meds: none given  Mobility use a wheelchair  Function Do you have any goals in this area?  no  Neuro/Psych No problems in this area  Prior Studies Any changes since last visit?  no  Physicians involved in your care Any changes since last visit?  no   Family History  Problem Relation Age of Onset  . Heart attack Father    Social History   Socioeconomic History  . Marital status: Single    Spouse name: Not on file  . Number of children: Not on file  . Years of education: Not on file  . Highest education level: Not on file  Occupational History  . Not on file  Social Needs  . Financial resource strain: Not on file  . Food insecurity    Worry: Not on file    Inability: Not on file  . Transportation needs    Medical: Not on file    Non-medical: Not on file  Tobacco Use  . Smoking status: Current Every Day Smoker  . Smokeless tobacco: Never Used  . Tobacco comment: Smoking 4 cigs/day  Substance and Sexual Activity  . Alcohol use: Yes    Alcohol/week: 2.0 standard drinks    Types: 2 Standard  drinks or equivalent per week  . Drug use: No  . Sexual activity: Never  Lifestyle  . Physical activity    Days per week: Not on file    Minutes per session: Not on file  . Stress: Not on file  Relationships  . Social Herbalist on phone: Not on file    Gets together: Not on file    Attends religious service: Not on file    Active member of club or organization: Not on file    Attends meetings of clubs or organizations: Not on file    Relationship status: Not on file   Other Topics Concern  . Not on file  Social History Narrative  . Not on file   No past surgical history on file. Past Medical History:  Diagnosis Date  . Hypertension    There were no vitals taken for this visit.  Opioid Risk Score:   Fall Risk Score:  `1  Depression screen PHQ 2/9  Depression screen [facility] 2/9 03/27/2019 06/27/2014  Decreased Interest 0 0  Down, Depressed, Hopeless 0 0  PHQ - 2 Score 0 0     Review of Systems  Constitutional: Negative.   HENT: Negative.   Eyes: Negative.   Respiratory: Negative.   Cardiovascular: Positive for leg swelling.  Gastrointestinal: Negative.   Endocrine: Negative.   Genitourinary: Negative.   Musculoskeletal: Positive for gait problem.  Skin: Negative.   Allergic/Immunologic: Negative.   Hematological: Negative.   Psychiatric/Behavioral: Negative.   All other systems reviewed and are negative.      Objective:   Physical Exam Vitals signs and nursing note reviewed.  Constitutional:      Appearance: Normal appearance.  Neck:     Musculoskeletal: Normal range of motion and neck supple.  Cardiovascular:     Rate and Rhythm: Normal rate and regular rhythm.     Pulses: Normal pulses.     Heart sounds: Normal heart sounds.  Pulmonary:     Effort: Pulmonary effort is normal.     Breath sounds: Normal breath sounds.  Musculoskeletal:     Comments: Normal Muscle Bulk and Muscle Testing Reveals:  Upper Extremities: Right: Decreased ROM 30 Degrees and Muscle Strength 0/5 Left: Full ROM and Muscle Strength 5/5 R Lower Extremities: Right: Decreased ROM and Muscle Strength 5/5 Wearing AFO Left: Full ROM and Muscle Strength 5/5 Arrived in wheelchair  Skin:    General: Skin is warm and dry.  Neurological:     Mental Status: He is alert and oriented to person, place, and time.  Psychiatric:        Mood and Affect: Mood normal.        Behavior: Behavior normal.           Assessment & Plan:  1. Basal Ganglia Stroke/  Right Hemiparesis: Has Neurology HFU scheduled. SW working on Outpatient Therapy with [facility].  2.Hypertension: Continue current medication regimen. Has a HFU appointment with [facility]. 3. Type 2 DM: Continue current medication regimen. PCP to follow.   20 minutes of face to face patient care time was spent during this visit. All questions were encouraged and answered.

## 2019-04-07 ENCOUNTER — Encounter: Payer: Self-pay | Admitting: Registered Nurse

## 2019-04-09 DIAGNOSIS — I639 Cerebral infarction, unspecified: Secondary | ICD-10-CM | POA: Diagnosis not present

## 2019-04-24 ENCOUNTER — Other Ambulatory Visit: Payer: Self-pay

## 2019-04-24 ENCOUNTER — Encounter: Payer: Self-pay | Admitting: Family Medicine

## 2019-04-24 ENCOUNTER — Ambulatory Visit: Payer: Self-pay | Attending: Family Medicine | Admitting: Family Medicine

## 2019-04-24 VITALS — BP 180/92 | HR 83 | Temp 98.3°F | Resp 16 | Ht 67.0 in | Wt 153.6 lb

## 2019-04-24 DIAGNOSIS — E1165 Type 2 diabetes mellitus with hyperglycemia: Secondary | ICD-10-CM

## 2019-04-24 DIAGNOSIS — G8110 Spastic hemiplegia affecting unspecified side: Secondary | ICD-10-CM

## 2019-04-24 DIAGNOSIS — I639 Cerebral infarction, unspecified: Secondary | ICD-10-CM

## 2019-04-24 DIAGNOSIS — I6381 Other cerebral infarction due to occlusion or stenosis of small artery: Secondary | ICD-10-CM

## 2019-04-24 DIAGNOSIS — I6930 Unspecified sequelae of cerebral infarction: Secondary | ICD-10-CM

## 2019-04-24 DIAGNOSIS — I10 Essential (primary) hypertension: Secondary | ICD-10-CM

## 2019-04-24 LAB — GLUCOSE, POCT (MANUAL RESULT ENTRY): POC Glucose: 97 mg/dL (ref 70–99)

## 2019-04-24 MED ORDER — CARVEDILOL 12.5 MG PO TABS: 12.5000 mg | ORAL_TABLET | Freq: Two times a day (BID) | ORAL | 5 refills | Status: DC

## 2019-04-24 MED ORDER — AMLODIPINE BESYLATE 10 MG PO TABS: 10.0000 mg | ORAL_TABLET | Freq: Every day | ORAL | 5 refills | Status: DC

## 2019-04-24 MED ORDER — TRUEPLUS LANCETS 28G MISC: 12 refills | Status: DC

## 2019-04-24 MED ORDER — BACLOFEN 5 MG PO TABS: 5.0000 mg | ORAL_TABLET | Freq: Three times a day (TID) | ORAL | 5 refills | Status: DC

## 2019-04-24 MED ORDER — TRUE METRIX METER W/DEVICE KIT: PACK | 0 refills | Status: AC

## 2019-04-24 MED ORDER — HYDRALAZINE HCL 100 MG PO TABS: 100.0000 mg | ORAL_TABLET | Freq: Three times a day (TID) | ORAL | 5 refills | Status: DC

## 2019-04-24 MED ORDER — METFORMIN HCL 850 MG PO TABS: 850.0000 mg | ORAL_TABLET | Freq: Two times a day (BID) | ORAL | 1 refills | Status: DC

## 2019-04-24 MED ORDER — TRUE METRIX BLOOD GLUCOSE TEST VI STRP: ORAL_STRIP | 12 refills | Status: DC

## 2019-04-24 MED ORDER — ATORVASTATIN CALCIUM 80 MG PO TABS: 80.0000 mg | ORAL_TABLET | Freq: Every day | ORAL | 5 refills | Status: DC

## 2019-04-24 MED FILL — hydrALAZINE HCL 100 MG TABS: 100 | 30 days supply | Qty: 90 | Fill #0

## 2019-04-24 MED FILL — metFORMIN HCL 850 MG TABS: 850 | 30 days supply | Qty: 60 | Fill #0

## 2019-04-24 MED FILL — ATORVASTATIN 80 MG TABLET: 80 | 30 days supply | Qty: 30 | Fill #0

## 2019-04-24 MED FILL — AMLODIPINE BESYLATE 10 MG T: 10 | 30 days supply | Qty: 30 | Fill #0

## 2019-04-24 MED FILL — TRUE METRIX GLUCOSE TEST ST: 30 days supply | Qty: 100 | Fill #0

## 2019-04-24 MED FILL — TRUEplus LANCETS 28G MISC: 30 days supply | Qty: 100 | Fill #0

## 2019-04-24 MED FILL — CARVEDILOL 12.5 MG TABLET: 12.5 | 30 days supply | Qty: 60 | Fill #0

## 2019-04-24 MED FILL — !TRUE METRIX BLOOD GLUCOSE: 30 days supply | Qty: 1 | Fill #0

## 2019-04-24 NOTE — Progress Notes (Signed)
Pt is requesting a meter

## 2019-04-24 NOTE — Patient Instructions (Signed)

## 2019-04-24 NOTE — Progress Notes (Signed)
Established Patient Office Visit  Subjective:  Patient ID: Casey Petty, male    DOB: March 01, 1961  Age: 58 y.o. MRN: [id number]  CC:  Chief Complaint  Patient presents with  . 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  Patient also with hypertension and uncontrolled type 2 diabetes.        At today's visit, patient reports that he does not currently have a glucometer and would like to have one prescribed so that he can check his home blood sugars.  He reports no current issues with increased thirst or urinary frequency related to his blood sugars.  He states that he is compliant with his blood pressure medication however blood pressure is elevated at today's visit.  Patient believes blood pressure may be elevated because he has taken his morning blood pressure medications but not those that are due later in the day.  He denies any current headaches or dizziness related to his blood pressure.         He reports that he does not have to use a cane or walker for ambulation at this time but does have issues with feeling off balance and will often place his hand against the wall or furniture for balance when he is not using any assistive device.  He does sometimes use a cane if he is walking on areas that are uneven or walking for long distances.  He also continues to wear a brace to the right ankle otherwise he will have foot drop as he has weakness in his right ankle.  He continues to have some difficulty with getting out the words that he is trying to say or trying to think of the right word.  He denies any difficulty swallowing at this time.  He denies any recent falls.  He reports that he is taking all the medications prescribed at the time of his hospital discharge and he will  need upcoming refills but he is not sure exactly when all of his medications are due for refill.  He is taking daily aspirin and denies any stomach upset, no blood in the stool and no black stools.  He denies any issues with bowel or bladder incontinence.  Past Medical History:  Diagnosis Date  . Hypertension   Now also with past medical history significant for CVA with right spastic hemiparesis and type 2 diabetes  Past Surgical History:  Procedure Laterality Date  . NO PAST SURGERIES      Family History  Problem Relation Age of Onset  . Heart attack Father   . Cancer Neg Hx     Social History   Socioeconomic History  . Marital status: Single    Spouse name: Not on file  . Number of children: Not on file  . Years of education: Not on file  . Highest education level: Not on file  Occupational History  . Not on file  Social Needs  . Financial resource strain: Not on file  . Food insecurity    Worry: Not on file    Inability: Not on file  . Transportation needs    Medical: Not on file    Non-medical: Not on file  Tobacco Use  . Smoking status: Current Every Day Smoker  . Smokeless tobacco: Never Used  . Tobacco comment: Smoking 4  cigs/day  Substance and Sexual Activity  . Alcohol use: Yes    Alcohol/week: 2.0 standard drinks    Types: 2 Standard drinks or equivalent per week  . Drug use: No  . Sexual activity: Never  Lifestyle  . Physical activity    Days per week: Not on file    Minutes per session: Not on file  . Stress: Not on file  Relationships  . Social Herbalist on phone: Not on file    Gets together: Not on file    Attends religious service: Not on file    Active member of club or organization: Not on file    Attends meetings of clubs or organizations: Not on file    Relationship status: Not on file  . Intimate partner violence    Fear of current or ex partner: Not on file    Emotionally abused: Not on file    Physically abused: Not on  file    Forced sexual activity: Not on file  Other Topics Concern  . Not on file  Social History Narrative  . Not on file    Outpatient Medications Prior to Visit  Medication Sig Dispense Refill  . acetaminophen (TYLENOL) 325 MG tablet Take 1-2 tablets (325-650 mg total) by mouth every 4 (four) hours as needed for mild pain.    Marland Kitchen aspirin 81 MG EC tablet Take 1 tablet (81 mg total) by mouth daily. 100 tablet 0  . famotidine (PEPCID) 20 MG tablet Take 1 tablet (20 mg total) by mouth daily. 30 tablet 0  . nicotine (NICODERM CQ - DOSED IN MG/24 HOURS) 21 mg/24hr patch Place 1 patch (21 mg total) onto the skin daily. 28 patch 0  . amLODipine (NORVASC) 10 MG tablet Take 1 tablet (10 mg total) by mouth daily. 30 tablet 3  . atorvastatin (LIPITOR) 80 MG tablet Take 1 tablet (80 mg total) by mouth daily at 6 PM. 30 tablet 3  . Baclofen 5 MG TABS Take 5 mg by mouth 3 (three) times daily. 90 tablet 0  . carvedilol (COREG) 12.5 MG tablet Take 1 tablet (12.5 mg total) by mouth 2 (two) times daily with a meal. 60 tablet 3  . hydrALAZINE (APRESOLINE) 100 MG tablet Take 1 tablet (100 mg total) by mouth every 8 (eight) hours. 90 tablet 2  . metFORMIN (GLUCOPHAGE) 850 MG tablet Take 1 tablet (850 mg total) by mouth 2 (two) times daily with a meal. 60 tablet 2   No facility-administered medications prior to visit.     Allergies  Allergen Reactions  . Lisinopril Swelling    Angioedema- lips/face    ROS Review of Systems  Constitutional: Positive for fatigue (Improving).  HENT: Negative for sore throat and trouble swallowing.   Eyes: Negative for photophobia and visual disturbance.  Respiratory: Negative for cough and shortness of breath.   Cardiovascular: Negative for chest pain, palpitations and leg swelling (Occasional in lower right leg/ankle).  Gastrointestinal: Negative for abdominal pain, blood in stool, constipation, diarrhea and nausea.  Endocrine: Negative for cold intolerance, heat  intolerance, polydipsia, polyphagia and polyuria.  Genitourinary: Negative for dysuria, flank pain and frequency.  Musculoskeletal: Positive for arthralgias and gait problem. Negative for back pain and joint swelling.  Neurological: Positive for speech difficulty and weakness. Negative for dizziness and headaches.  Hematological: Negative for adenopathy. Does not bruise/bleed easily.  Psychiatric/Behavioral: Negative for self-injury and suicidal ideas.      Objective:    Physical Exam  Constitutional: He is oriented to person, place, and time. He appears well-developed and well-nourished.  Well-nourished well-developed male in no acute distress.  Patient with abnormal, hemiplegic gait.  Patient with issues with clarity of speech/dysarthric speech and has occasional difficulty with word finding and word output  Neck: Normal range of motion. No JVD present. No thyromegaly present.  Cardiovascular: Normal rate and regular rhythm.  No carotid bruits appreciated on exam  Pulmonary/Chest: Effort normal and breath sounds normal.  Abdominal: Soft. There is no abdominal tenderness. There is no rebound and no guarding.  Musculoskeletal:        General: No edema.     Comments: Patient with spasticity and decreased range of motion of right upper extremity as well as decreased strength in right lower extremity with right foot drop requiring use of AFO brace.  Mild discomfort when patient self attempts to lift right upper extremity at the shoulder  Lymphadenopathy:    He has no cervical adenopathy.  Neurological: He is alert and oriented to person, place, and time. A cranial nerve deficit is present. He exhibits abnormal muscle tone (Spasticity of right upper and right lower extremity).  Patient with dysarthric speech.  Patient with 3/5 right upper extremity strength with spasticity and 4/5 lower extremity strength with exception of 0/5 right dorsiflexion-presence of a AFO brace to right ankle/foot   Skin: Skin is warm and dry.  Psychiatric: He has a normal mood and affect. His behavior is normal.  Nursing note and vitals reviewed.   BP (!) 180/92   Pulse 83   Temp 98.3 F (36.8 C) (Oral)   Resp 16   Ht 5\' 7"  (1.702 m)   Wt 153 lb 9.6 oz (69.7 kg)   SpO2 96%   BMI 24.06 kg/m  Wt Readings from Last 3 Encounters:  06/06/19 165 lb (74.8 kg)  04/24/19 153 lb 9.6 oz (69.7 kg)  03/06/19 159 lb 7.7 oz (72.3 kg)     Health Maintenance Due  Topic Date Due  . Hepatitis C Screening  09[birth year]1962  . FOOT EXAM  04/14/1973  . OPHTHALMOLOGY EXAM  04/14/1973  . HIV Screening  04/14/1978  . TETANUS/TDAP  04/14/1982  . COLONOSCOPY  04/14/2013   No results found for: TSH Lab Results  Component Value Date   WBC 10.2 03/18/2019   HGB 15.2 03/18/2019   HCT 46.2 03/18/2019   MCV 92.4 03/18/2019   PLT 278 03/18/2019   Lab Results  Component Value Date   NA 138 05/08/2019   K 4.2 05/08/2019   CO2 23 05/08/2019   GLUCOSE 134 (H) 05/08/2019   BUN 7 05/08/2019   CREATININE 0.93 05/08/2019   BILITOT 0.6 05/08/2019   ALKPHOS 99 05/08/2019   AST 19 05/08/2019   ALT 20 05/08/2019   PROT 7.2 05/08/2019   ALBUMIN 4.6 05/08/2019   CALCIUM 10.1 05/08/2019   ANIONGAP 15 03/18/2019   Lab Results  Component Value Date   CHOL 120 05/08/2019   Lab Results  Component Value Date   HDL 47 05/08/2019   Lab Results  Component Value Date   LDLCALC 57 05/08/2019   Lab Results  Component Value Date   TRIG 82 05/08/2019   Lab Results  Component Value Date   CHOLHDL 2.6 05/08/2019   Lab Results  Component Value Date   HGBA1C 7.9 (H) 03/11/2019      Assessment & Plan:  1. Uncontrolled type 2 diabetes mellitus with hyperglycemia [facility]) Patient had new diagnosis of hypertension  during his hospitalization for CVA from July to August of this year.  Patient's hemoglobin A1c was initially 7.9 on 03/11/2019.  Patient reports that he feels as if his blood sugars are improved and he is  having no current issues with increased thirst or urinary frequency.  He however does not have a home glucometer therefore this will be prescribed as well as supplies for monitoring.  Patient will also be referred to meet with the clinical pharmacist for instructions regarding glucometer use.  He is currently on Metformin 850 twice daily and patient's glucose at today's visit was 97.  He denies any significant episodes of hypoglycemia.  Discussed with patient that he will need ophthalmology referral for diabetic eye exam once per year as well as discussion regarding diabetic foot care. - POCT glucose (manual entry) - metFORMIN (GLUCOPHAGE) 850 MG tablet; Take 1 tablet (850 mg total) by mouth 2 (two) times daily with a meal.  Dispense: 180 tablet; Refill: 1 - Amb Referral to Clinical Pharmacist  2. Spastic hemiparesis ([place]) Patient with right spastic hemiparesis status post basal ganglia CVA.  He is provided with refill of baclofen as per hospital discharge but patient is also to keep upcoming appointment with physical medicine and rehab as his dose may need to be adjusted to further help with spasticity. - Baclofen 5 MG TABS; Take 5 mg by mouth 3 (three) times daily. For muscle tightness  Dispense: 90 tablet; Refill: 5  3. Essential hypertension Blood pressure remains elevated at today's visit and patient is reminded of the importance of compliance with medication to help prevent additional/new CVA.  Patient is provided with refills of amlodipine, Coreg and hydralazine and is encouraged to make sure that he understands the instructions regarding how many times per day that he should take each medication.  Home monitoring of blood pressure is also advised.  Patient will follow up with clinical pharmacist to make sure that his blood pressure has improved and for any further assistance with changes in medication to promote improvement in blood pressure with goal of blood pressure less than 140/90 and eventual  goal blood pressure of 130/80 or less.  Low-sodium diet encouraged.  Patient given information on DASH diet as well as information on hypertension as part of after visit summary. - amLODipine (NORVASC) 10 MG tablet; Take 1 tablet (10 mg total) by mouth daily.  Dispense: 30 tablet; Refill: 5 - carvedilol (COREG) 12.5 MG tablet; Take 1 tablet (12.5 mg total) by mouth 2 (two) times daily with a meal.  Dispense: 60 tablet; Refill: 5 - hydrALAZINE (APRESOLINE) 100 MG tablet; Take 1 tablet (100 mg total) by mouth every 8 (eight) hours.  Dispense: 90 tablet; Refill: 5 - Amb Referral to Clinical Pharmacist  4. History of CVA with residual deficit 5. Basal ganglia stroke (HCC) Discussed the importance of compliance with all medications including atorvastatin which was prescribed status post patient's CVA due to basal ganglia stroke.  He denies any current increased muscle aches with the use of atorvastatin.  Patient is nonfasting and will return for future labs including comprehensive metabolic panel and lipid panel at a future fasting lab visit.  Patient will have upcoming appointment with clinical pharmacist in follow-up of hypertension and diabetes. - atorvastatin (LIPITOR) 80 MG tablet; Take 1 tablet (80 mg total) by mouth daily at 6 PM.  Dispense: 30 tablet; Refill: 5   Meds ordered this encounter  Medications  . amLODipine (NORVASC) 10 MG tablet    Sig: Take  1 tablet (10 mg total) by mouth daily.    Dispense:  30 tablet    Refill:  5    NEEDS ALL BP MEDS FILLED today then can arrange 90 day mail order  . atorvastatin (LIPITOR) 80 MG tablet    Sig: Take 1 tablet (80 mg total) by mouth daily at 6 PM.    Dispense:  30 tablet    Refill:  5    Can do 30 today then set up by mail  . carvedilol (COREG) 12.5 MG tablet    Sig: Take 1 tablet (12.5 mg total) by mouth 2 (two) times daily with a meal.    Dispense:  60 tablet    Refill:  5    Can do 30 days today and then set up by mail  . hydrALAZINE  (APRESOLINE) 100 MG tablet    Sig: Take 1 tablet (100 mg total) by mouth every 8 (eight) hours.    Dispense:  90 tablet    Refill:  5    NEEDS ALL BP REFILLS TODAY- can do 30 days today then mail 90 day refills  . DISCONTD: metFORMIN (GLUCOPHAGE) 850 MG tablet    Sig: Take 1 tablet (850 mg total) by mouth 2 (two) times daily with a meal.    Dispense:  180 tablet    Refill:  1  . DISCONTD: Baclofen 5 MG TABS    Sig: Take 5 mg by mouth 3 (three) times daily. For muscle tightness    Dispense:  90 tablet    Refill:  5    May not need refill today   An After Visit Summary was printed and given to the patient.  Follow-up: Return in about 3 months (around 07/24/2019) for BP/DM- 2 weeks with Lurena Joiner .    [name], MD

## 2019-05-01 ENCOUNTER — Other Ambulatory Visit: Payer: Self-pay

## 2019-05-01 ENCOUNTER — Ambulatory Visit: Payer: Medicaid Other | Admitting: Adult Health

## 2019-05-01 ENCOUNTER — Encounter: Payer: Self-pay | Admitting: Adult Health

## 2019-05-01 VITALS — BP 125/71 | HR 72 | Temp 97.7°F | Ht 67.0 in

## 2019-05-01 DIAGNOSIS — E785 Hyperlipidemia, unspecified: Secondary | ICD-10-CM | POA: Diagnosis not present

## 2019-05-01 DIAGNOSIS — I639 Cerebral infarction, unspecified: Secondary | ICD-10-CM | POA: Diagnosis not present

## 2019-05-01 DIAGNOSIS — I6381 Other cerebral infarction due to occlusion or stenosis of small artery: Secondary | ICD-10-CM

## 2019-05-01 DIAGNOSIS — R471 Dysarthria and anarthria: Secondary | ICD-10-CM

## 2019-05-01 DIAGNOSIS — E119 Type 2 diabetes mellitus without complications: Secondary | ICD-10-CM

## 2019-05-01 DIAGNOSIS — G8111 Spastic hemiplegia affecting right dominant side: Secondary | ICD-10-CM

## 2019-05-01 DIAGNOSIS — I10 Essential (primary) hypertension: Secondary | ICD-10-CM

## 2019-05-01 NOTE — Progress Notes (Signed)
[facility] [address] [place]. [place] [address] [phone]       HOSPITAL FOLLOW UP NOTE  [name] Date of Birth:  1961/06/11 Medical Record Number:  [id number]   Reason for Referral:  hospital stroke follow up    CHIEF COMPLAINT:  Chief Complaint  Patient presents with  . Follow-up    treatment room, with fiancee. Cva hx f/u. 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  History obtained from patient, fianc and chart review. Reviewed all radiology images and labs personally.  [name] is a 58 y.o. male with history of hypertension  who presented to [facility] ED on 02/28/2019 with right sided weakness and gait difficulties. He did not receive IV t-PA due to late presentation (>4.5 hours from time of onset).  Stroke work-up revealed left BG small infarct as evidenced on MRI likely secondary to small vessel disease source with resultant dysarthria and right hemiparesis.  TCD and carotid Doppler unremarkable.  2D echo normal EF.  Recommended DAPT for 3 weeks and aspirin alone. Hx of HTN with long-term BP goal normotensive range and discharged on home dose amlodipine and initiated lisinopril.  Initiated atorvastatin 80 mg daily for HLD management.  Current tobacco use with smoking cessation counseling provided.  Uncontrolled DM with A1c 7.9.  Other stroke risk factors include EtOH use but no prior history of stroke.  He was discharged to [facility] for ongoing therapy and discharged home on 03/20/2019.  Casey Petty is being seen today for hospital follow-up accompanied by his fiance.  Residual deficits of right hemiparesis and mild dysarthria with mild right facial droop.  He is not currently receiving any therapies as he is Medicaid pending.  He does continue to do exercises at home with ongoing improvement.  He does  ambulate without assistive device but does use wheelchair for long distance.  Currently has AFO brace in place for right ankle weakness.  Completed 3 weeks DAPT and continues on aspirin alone without bleeding or bruising.  Continues on atorvastatin 80 mg daily without myalgias.  Blood pressure today 125/71.  Glucose levels have been stable with ongoing use of metformin.  Continues to follow with PCP for HTN, HLD and DM management.  Denies new or worsening stroke/TIA symptoms.    ROS:   14 system review of systems performed and negative with exception of weakness and ambulation difficulty  PMH:  Past Medical History:  Diagnosis Date  . Hypertension     PSH: No past surgical history on file.  Social History:  Social History   Socioeconomic History  . Marital status: Single    Spouse name: Not on file  . Number of children: Not on file  . Years of education: Not on file  . Highest education level: Not on file  Occupational History  . Not on file  Social Needs  . Financial resource strain: Not on file  . Food insecurity    Worry: Not on file    Inability: Not on file  . Transportation needs    Medical: Not on file    Non-medical: Not on file  Tobacco Use  . Smoking status: Current Every Day Smoker  . Smokeless tobacco: Never Used  . Tobacco comment: Smoking 4 cigs/day  Substance and Sexual Activity  . Alcohol use: Yes    Alcohol/week: 2.0 standard drinks  Types: 2 Standard drinks or equivalent per week  . Drug use: No  . Sexual activity: Never  Lifestyle  . Physical activity    Days per week: Not on file    Minutes per session: Not on file  . Stress: Not on file  Relationships  . Social Herbalist on phone: Not on file    Gets together: Not on file    Attends religious service: Not on file    Active member of club or organization: Not on file    Attends meetings of clubs or organizations: Not on file    Relationship status: Not on file  . Intimate  partner violence    Fear of current or ex partner: Not on file    Emotionally abused: Not on file    Physically abused: Not on file    Forced sexual activity: Not on file  Other Topics Concern  . Not on file  Social History Narrative  . Not on file    Family History:  Family History  Problem Relation Age of Onset  . Heart attack Father     Medications:   Current Outpatient Medications on File Prior to Visit  Medication Sig Dispense Refill  . acetaminophen (TYLENOL) 325 MG tablet Take 1-2 tablets (325-650 mg total) by mouth every 4 (four) hours as needed for mild pain.    Marland Kitchen amLODipine (NORVASC) 10 MG tablet Take 1 tablet (10 mg total) by mouth daily. 30 tablet 5  . aspirin 81 MG EC tablet Take 1 tablet (81 mg total) by mouth daily. 100 tablet 0  . atorvastatin (LIPITOR) 80 MG tablet Take 1 tablet (80 mg total) by mouth daily at 6 PM. 30 tablet 5  . Baclofen 5 MG TABS Take 5 mg by mouth 3 (three) times daily. For muscle tightness 90 tablet 5  . Blood Glucose Monitoring Suppl (TRUE METRIX METER) w/Device KIT Check blood sugar fasting and before meals and again if pt feels bad (symptoms of hypo). 1 kit 0  . carvedilol (COREG) 12.5 MG tablet Take 1 tablet (12.5 mg total) by mouth 2 (two) times daily with a meal. 60 tablet 5  . famotidine (PEPCID) 20 MG tablet Take 1 tablet (20 mg total) by mouth daily. 30 tablet 0  . glucose blood (TRUE METRIX BLOOD GLUCOSE TEST) test strip Use as instructed 100 each 12  . hydrALAZINE (APRESOLINE) 100 MG tablet Take 1 tablet (100 mg total) by mouth every 8 (eight) hours. 90 tablet 5  . metFORMIN (GLUCOPHAGE) 850 MG tablet Take 1 tablet (850 mg total) by mouth 2 (two) times daily with a meal. 180 tablet 1  . nicotine (NICODERM CQ - DOSED IN MG/24 HOURS) 21 mg/24hr patch Place 1 patch (21 mg total) onto the skin daily. 28 patch 0  . TRUEplus Lancets 28G MISC Check blood sugar fasting and before meals and again if pt feels bad (symptoms of hypo). 100 each 12    No current facility-administered medications on file prior to visit.     Allergies:   Allergies  Allergen Reactions  . Lisinopril Swelling    Angioedema- lips/face     Physical Exam  Vitals:   05/01/19 1015  BP: 125/71  Pulse: 72  Temp: 97.7 F (36.5 C)  Height: '5\' 7"'$  (1.702 m)   Body mass index is 24.06 kg/m. No exam data present  Depression screen [facility] 2/9 05/01/2019  Decreased Interest 0  Down, Depressed, Hopeless 0  PHQ -  2 Score 0     General: well developed, well nourished, pleasant middle-age African-American male, seated, in no evident distress Head: head normocephalic and atraumatic.   Neck: supple with no carotid or supraclavicular bruits Cardiovascular: regular rate and rhythm, no murmurs Musculoskeletal: no deformity Skin:  no rash/petichiae Vascular:  Normal pulses all extremities   Neurologic Exam Mental Status: Awake and fully alert.   Mild dysarthria.  Oriented to place and time. Recent and remote memory intact. Attention span, concentration and fund of knowledge appropriate. Mood and affect appropriate.  Cranial Nerves: Fundoscopic exam reveals sharp disc margins. Pupils equal, briskly reactive to light. Extraocular movements full without nystagmus. Visual fields full to confrontation. Hearing intact. Facial sensation intact.  Mild right lower facial weakness. Motor: RUE: 3+/5 with weak grip strength and spasticity RLE: 3+-4/5 hip flexor and ankle dorsiflexion Full strength left upper and lower extremity Sensory.: intact to touch , pinprick , position and vibratory sensation.  Coordination: Rapid alternating movements normal on left side. Finger-to-nose and heel-to-shin performed accurately on left side. Gait and Station: Arises from chair without difficulty. Stance is normal. Gait demonstrates  hemiplegic gait without balance difficulties or assistive device Reflexes: 1+ and symmetric. Toes downgoing.     NIHSS  7 Modified Rankin  3     Diagnostic Data (Labs, Imaging, Testing)  [name] Contrast 02/28/2019 IMPRESSION:  1. Technically limited exam due to the patient's inability to tolerate the full length of the study as well as motion artifact.  2. 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  3. Additional possible punctate acute to early subacute ischemic infarct involving the posterior limb of the contralateral right internal capsule.   [name] Contrast 02/28/2019 IMPRESSION:  1. Mild degenerative disc bulging with facet hypertrophy at L5-S1 with resultant moderate bilateral L5 foraminal stenosis. Either of the exiting L5 nerve roots could be affected.  2. Disc bulging with moderate facet hypertrophy at L4-5 with resultant mild bilateral L4 foraminal stenosis. No frank impingement.  3. Mild-to-moderate multilevel facet hypertrophy throughout the lumbar spine, most pronounced at L4-5 where there is associated reactive marrow edema. Finding could contribute to underlying low back pain.   Vas US Carotid (at [place] Only) 03/01/2019 Summary:  Right Carotid: Velocities in the right ICA are consistent with a 1-39% stenosis.  Left Carotid: Velocities in the left ICA are consistent with a 1-39% stenosis.  Vertebrals: Bilateral vertebral arteries demonstrate antegrade flow.  Transthoracic Echocardiogram  1. The left ventricle has hyperdynamic systolic function, with an ejection fraction of >65%. The cavity size was normal. There is mild concentric left ventricular hypertrophy. Left ventricular diastolic Doppler parameters are consistent with impaired  relaxation. 2. The right ventricle has normal systolic function. The cavity was normal. There is no increase in right ventricular wall thickness. Right ventricular systolic pressure is normal. 3. The aortic valve is tricuspid. No stenosis of the aortic valve. 4. The aorta is normal in size and structure.   EKG -  SR rate 88 BPM. (See cardiology reading for complete details)     ASSESSMENT: Casey Petty is a 58 y.o. year old male presented with right-sided weakness and gait difficulties on 02/28/2019 with stroke work-up revealing left BG small infarct secondary to small vessel disease. Vascular risk factors include HTN, HLD and tobacco use.  Residual deficits of right spastic hemiparesis and mild dysarthria    PLAN:  1. Left BG infarct: Continue aspirin 81  mg daily  and atorvastatin for secondary stroke prevention. Maintain strict control of hypertension with blood pressure goal below 130/90, diabetes with hemoglobin A1c goal below 6.5% and cholesterol with LDL cholesterol (bad cholesterol) goal below 70 mg/dL.  I also advised the patient to eat a healthy diet with plenty of whole grains, cereals, fruits and vegetables, exercise regularly with at least 30 minutes of continuous activity daily and maintain ideal body weight. 2. HTN: Advised to continue current treatment regimen.  Today's BP stable.  Advised to continue to monitor at home along with continued follow-up with PCP for management 3. HLD: Advised to continue current treatment regimen along with continued follow-up with PCP for future prescribing and monitoring of lipid panel.  Recommend lipid panel at follow-up visit  4. DM: Continue to monitor glucose levels at home and ongoing follow-up with PCP 5. Residual deficits: Continuation of home exercises and to start therapy once able.  He was provided information regarding [place] financial assistance program    Follow up in 3 months or call earlier if needed   Greater than 50% of time during this 45 minute visit was spent on counseling, explanation of diagnosis of left BG infarct, reviewing risk factor management of HTN, HLD and tobacco use, planning of further management along with potential future management, and discussion with patient and family answering all questions.    [name], AGNP-BC  [facility] [address] [place], [place] [address]  Phone [phone] Fax [phone] Note: This document was prepared with digital dictation and possible smart phrase technology. Any transcriptional errors that result from this process are unintentional.

## 2019-05-01 NOTE — Patient Instructions (Signed)
Continue aspirin 81 mg daily  and Lipitor for secondary stroke prevention  Continue to follow up with PCP regarding, blood pressure and diabetes management   Continue to monitor blood pressure at home  Maintain strict control of hypertension with blood pressure goal below 130/90, diabetes with hemoglobin A1c goal below 6.5% and cholesterol with LDL cholesterol (bad cholesterol) goal below 70 mg/dL. I also advised the patient to eat a healthy diet with plenty of whole grains, cereals, fruits and vegetables, exercise regularly and maintain ideal body weight.  Followup in the future with me in 3 months or call earlier if needed       Thank you for coming to see [place] at [facility]. I hope we have been able to provide you high quality care today.  You may receive a patient satisfaction survey over the next few weeks. We would appreciate your feedback and comments so that we may continue to improve ourselves and the health of our patients.

## 2019-05-01 NOTE — Progress Notes (Signed)
I agree with the above plan 

## 2019-05-06 MED FILL — BACLOFEN 5 MG TABS: 5 | 30 days supply | Qty: 90 | Fill #0

## 2019-05-08 ENCOUNTER — Other Ambulatory Visit: Payer: Self-pay

## 2019-05-08 ENCOUNTER — Encounter: Payer: Self-pay | Admitting: Pharmacist

## 2019-05-08 ENCOUNTER — Ambulatory Visit: Payer: Medicaid Other | Attending: Family Medicine | Admitting: Pharmacist

## 2019-05-08 VITALS — BP 133/76 | HR 74

## 2019-05-08 DIAGNOSIS — I10 Essential (primary) hypertension: Secondary | ICD-10-CM | POA: Diagnosis not present

## 2019-05-08 DIAGNOSIS — E1165 Type 2 diabetes mellitus with hyperglycemia: Secondary | ICD-10-CM

## 2019-05-08 DIAGNOSIS — I252 Old myocardial infarction: Secondary | ICD-10-CM | POA: Insufficient documentation

## 2019-05-08 DIAGNOSIS — Z8673 Personal history of transient ischemic attack (TIA), and cerebral infarction without residual deficits: Secondary | ICD-10-CM | POA: Insufficient documentation

## 2019-05-08 DIAGNOSIS — E11649 Type 2 diabetes mellitus with hypoglycemia without coma: Secondary | ICD-10-CM | POA: Diagnosis not present

## 2019-05-08 DIAGNOSIS — I2510 Atherosclerotic heart disease of native coronary artery without angina pectoris: Secondary | ICD-10-CM | POA: Diagnosis not present

## 2019-05-08 DIAGNOSIS — Z7984 Long term (current) use of oral hypoglycemic drugs: Secondary | ICD-10-CM | POA: Insufficient documentation

## 2019-05-08 DIAGNOSIS — Z23 Encounter for immunization: Secondary | ICD-10-CM

## 2019-05-08 LAB — GLUCOSE, POCT (MANUAL RESULT ENTRY): POC Glucose: 153 mg/dL — AB (ref 70–99)

## 2019-05-08 NOTE — Patient Instructions (Signed)
Thank you for coming to see me today. Please do the following:  1. Continue all medications. Your blood sugar looks good but I need to see your meter next time.  2. Continue checking blood sugars at home.  3. Continue making the lifestyle changes we've discussed together during our visit. Diet and exercise play a significant role in improving your blood sugars.  4. Follow-up with me in 3 weeks.   Hypoglycemia or low blood sugar:   Low blood sugar can happen quickly and may become an emergency if not treated right away.   While this shouldn't happen often, it can be brought upon if you skip a meal or do not eat enough. Also, if your insulin or other diabetes medications are dosed too high, this can cause your blood sugar to go to low.   Warning signs of low blood sugar include: 1. Feeling shaky or dizzy 2. Feeling weak or tired  3. Excessive hunger 4. Feeling anxious or upset  5. Sweating even when you aren't exercising  What to do if I experience low blood sugar? 1. Check your blood sugar with your meter. If lower than 70, proceed to step 2.  2. Treat with 3-4 glucose tablets or 3 packets of regular sugar. If these aren't around, you can try hard candy. Yet another option would be to drink 4 ounces of fruit juice or 6 ounces of REGULAR soda.  3. Re-check your sugar in 15 minutes. If it is still below 70, do what you did in step 2 again. If has come back up, go ahead and eat a snack or small meal at this time.

## 2019-05-08 NOTE — Progress Notes (Addendum)
    S:     No chief complaint on file.  Patient arrives in good spirits.  Presents for diabetes evaluation, education, and management Patient was referred and last seen by Primary Care Provider on 04/24/19.   Patient reports Diabetes was diagnosed in July.   Family/Social History:  - FHx: heart attack (father) - Tobacco: current every day smoker (5 cigarettes a day, currently using NRT via patch) - Alcohol: reports drinking 2 beers/day  Insurance coverage/medication affordability: [place] Medicaid  Patient reports adherence with medications.  Current diabetes medications include: metformin 850 mg BID Current hypertension medications include: amlodipine 10mg  daily, caredilol 12.5 mg BID, hydralazine 100 mg TID; previously tried lisinopril in hospital but this was discontinued d/t angioedema Current hyperlipidemia medications include: atorvastatin 80 mg daily  Patient reports 1 hypoglycemic event treated successfully.   Patient reported dietary habits:  - Per pt, he denies drinking sweetened beverages or dessert foods - Denies excess consumption of carbs   Patient-reported exercise habits:  - Limited d/t recent CVA   Patient denies nocturia.  Patient denies neuropathy. Patient denies visual changes. Patient reports self foot exams.     O:  Vitals:   05/08/19 1340  BP: 133/76  Pulse: 74   POCT glucose: 153  Lab Results  Component Value Date   HGBA1C 7.9 (H) 03/11/2019   There were no vitals filed for this visit.  Lipid Panel     Component Value Date/Time   CHOL 133 07/18/2014 0922   TRIG 411 (H) 07/18/2014 0922   HDL 38 (L) 07/18/2014 0922   CHOLHDL 3.5 07/18/2014 0922   VLDL NOT CALC 07/18/2014 0922   LDLCALC NOT CALC 07/18/2014 0922    Home fasting CBG: 53 - 150   Clinical ASCVD: Yes  The ASCVD Risk score Mikey Bussing DC Jr., et al., 2013) failed to calculate for the following reasons:   The patient has a prior MI or stroke diagnosis   A/P: Diabetes newly dx  currently uncontrolled based on A1c but home sugars reveal improvement. Patient is able to verbalize appropriate hypoglycemia management plan. Patient is adherent with medication.  I am having to rely on patients word concerning home sugars. He did not bring his meter. I have advised him to bring this in for review in 3 weeks. With his stroke hx, pt would benefit from GLP-1 RA therapy if control is not attained with metformin alone. Will wait to make any adjustments after pt brings in his meter.  -Continued metformin 850 mg BID.   -Extensively discussed pathophysiology of DM, recommended lifestyle interventions, dietary effects on glycemic control -Counseled on s/sx of and management of hypoglycemia -Next A1C anticipated 06/2019.  -CMP14 + GFR -Urine Microalbumin:SCr ratio  ASCVD risk - secondary prevention in patient with DM. Needs lipid panel. High intensity statin indicated. Aspirin is indicated.  -Continued aspirin 81 mg  -Continued atorvastatin 80 mg.   Hypertension longstanding currently close to goal.  BP goal <130/80 mmHg. Patient is adherent with medication. -Continued current regimen   HM: flu, tetanus and Pneumovax indicated.  - Fluarix 2020-21 given - Pneumovax given - Will address tetanus at f/u   Written patient instructions provided.  Total time in face to face counseling 30 minutes.   Follow up Pharmacist Clinic Visit in 3 weeks.   Benard Halsted, PharmD, [place] [phone]

## 2019-05-09 DIAGNOSIS — I639 Cerebral infarction, unspecified: Secondary | ICD-10-CM | POA: Diagnosis not present

## 2019-05-09 LAB — CMP14+EGFR
ALT: 20 IU/L (ref 0–44)
AST: 19 IU/L (ref 0–40)
Albumin/Globulin Ratio: 1.8 (ref 1.2–2.2)
Albumin: 4.6 g/dL (ref 3.8–4.9)
Alkaline Phosphatase: 99 IU/L (ref 39–117)
BUN/Creatinine Ratio: 8 — ABNORMAL LOW (ref 9–20)
BUN: 7 mg/dL (ref 6–24)
Bilirubin Total: 0.6 mg/dL (ref 0.0–1.2)
CO2: 23 mmol/L (ref 20–29)
Calcium: 10.1 mg/dL (ref 8.7–10.2)
Chloride: 100 mmol/L (ref 96–106)
Creatinine, Ser: 0.93 mg/dL (ref 0.76–1.27)
GFR calc Af Amer: 106 mL/min/1.73
GFR calc non Af Amer: 91 mL/min/1.73
Globulin, Total: 2.6 g/dL (ref 1.5–4.5)
Glucose: 134 mg/dL — ABNORMAL HIGH (ref 65–99)
Potassium: 4.2 mmol/L (ref 3.5–5.2)
Sodium: 138 mmol/L (ref 134–144)
Total Protein: 7.2 g/dL (ref 6.0–8.5)

## 2019-05-09 LAB — LIPID PANEL
Chol/HDL Ratio: 2.6 ratio (ref 0.0–5.0)
Cholesterol, Total: 120 mg/dL (ref 100–199)
HDL: 47 mg/dL
LDL Chol Calc (NIH): 57 mg/dL (ref 0–99)
Triglycerides: 82 mg/dL (ref 0–149)
VLDL Cholesterol Cal: 16 mg/dL (ref 5–40)

## 2019-05-09 LAB — MICROALBUMIN / CREATININE URINE RATIO
Creatinine, Urine: 255.9 mg/dL
Microalb/Creat Ratio: 7 mg/g{creat} (ref 0–29)
Microalbumin, Urine: 18 ug/mL

## 2019-05-21 ENCOUNTER — Telehealth: Payer: Self-pay | Admitting: Family Medicine

## 2019-05-21 ENCOUNTER — Ambulatory Visit: Payer: Medicaid Other | Admitting: Physical Medicine & Rehabilitation

## 2019-05-21 NOTE — Telephone Encounter (Signed)
Spoke with patient and Andria Frames and informed both of them pt need to schedule appt with provider to discuss reason for [facility] service. Both agreed and appt was scheduled.

## 2019-05-21 NOTE — Telephone Encounter (Signed)
Patient friend called stating that they need a PCS service to PCP form sent to Mallie Mussel to Social services. Please follow up.  Phone number [phone]

## 2019-05-24 MED FILL — ATORVASTATIN 80 MG TABLET: 80 | 30 days supply | Qty: 30 | Fill #1

## 2019-05-24 MED FILL — metFORMIN HCL 850 MG TABS: 850 | 30 days supply | Qty: 60 | Fill #1

## 2019-05-24 MED FILL — AMLODIPINE BESYLATE 10 MG T: 10 | 30 days supply | Qty: 30 | Fill #1

## 2019-05-24 MED FILL — CARVEDILOL 12.5 MG TABLET: 12.5 | 30 days supply | Qty: 60 | Fill #1

## 2019-05-29 ENCOUNTER — Other Ambulatory Visit: Payer: Self-pay

## 2019-05-29 ENCOUNTER — Encounter: Payer: Self-pay | Admitting: Pharmacist

## 2019-05-29 ENCOUNTER — Ambulatory Visit: Payer: Medicaid Other | Attending: Family Medicine | Admitting: Pharmacist

## 2019-05-29 DIAGNOSIS — Z7982 Long term (current) use of aspirin: Secondary | ICD-10-CM | POA: Diagnosis not present

## 2019-05-29 DIAGNOSIS — Z7984 Long term (current) use of oral hypoglycemic drugs: Secondary | ICD-10-CM | POA: Insufficient documentation

## 2019-05-29 DIAGNOSIS — F17210 Nicotine dependence, cigarettes, uncomplicated: Secondary | ICD-10-CM | POA: Diagnosis not present

## 2019-05-29 DIAGNOSIS — I10 Essential (primary) hypertension: Secondary | ICD-10-CM | POA: Diagnosis not present

## 2019-05-29 DIAGNOSIS — Z8249 Family history of ischemic heart disease and other diseases of the circulatory system: Secondary | ICD-10-CM | POA: Insufficient documentation

## 2019-05-29 DIAGNOSIS — Z8673 Personal history of transient ischemic attack (TIA), and cerebral infarction without residual deficits: Secondary | ICD-10-CM | POA: Diagnosis not present

## 2019-05-29 DIAGNOSIS — Z79899 Other long term (current) drug therapy: Secondary | ICD-10-CM | POA: Diagnosis not present

## 2019-05-29 DIAGNOSIS — E1165 Type 2 diabetes mellitus with hyperglycemia: Secondary | ICD-10-CM

## 2019-05-29 DIAGNOSIS — E119 Type 2 diabetes mellitus without complications: Secondary | ICD-10-CM | POA: Diagnosis not present

## 2019-05-29 DIAGNOSIS — Z23 Encounter for immunization: Secondary | ICD-10-CM | POA: Diagnosis not present

## 2019-05-29 MED ORDER — JARDIANCE 10 MG PO TABS: 10.0000 mg | ORAL_TABLET | Freq: Every day | ORAL | 2 refills | Status: DC

## 2019-05-29 NOTE — Progress Notes (Signed)
S:     No chief complaint on file.  Patient arrives in good spirits.  Presents for diabetes evaluation, education, and management Patient was referred and last seen by Primary Care Provider on 04/24/19.   Patient reports Diabetes was diagnosed in July.   Family/Social History:  - FHx: heart attack (father) - Tobacco: current every day smoker (5 cigarettes a day, currently using NRT via patch) - Alcohol: reports drinking 2 beers/day  Insurance coverage/medication affordability: [place] Medicaid  Patient reports adherence with medications.  Current diabetes medications include: metformin 850 mg BID - reports diarrhea believed to be associated  Current hypertension medications include: amlodipine 10mg  daily, caredilol 12.5 mg BID, hydralazine 100 mg TID; previously tried lisinopril in hospital but this was discontinued d/t angioedema Current hyperlipidemia medications include: atorvastatin 80 mg daily  Patient denies hypoglycemia.    Patient reported dietary habits:  - Per pt, he denies drinking sweetened beverages or dessert foods - Denies excess consumption of carbs   Patient-reported exercise habits:  - Limited d/t recent CVA   Patient denies nocturia.  Patient denies neuropathy. Patient denies visual changes. Patient reports self foot exams.     O:  There were no vitals filed for this visit. POCT glucose: 185 - ate a biscuit this morning   Lab Results  Component Value Date   HGBA1C 7.9 (H) 03/11/2019   There were no vitals filed for this visit.  Lipid Panel     Component Value Date/Time   CHOL 120 05/08/2019 0914   TRIG 82 05/08/2019 0914   HDL 47 05/08/2019 0914   CHOLHDL 2.6 05/08/2019 0914   CHOLHDL 3.5 07/18/2014 0922   VLDL NOT CALC 07/18/2014 0922   LDLCALC 57 05/08/2019 0914    Home fasting CBG: denies any blood sugar < 70 or > 130; I am unable to verify this as pt does not have his meter.    Clinical ASCVD: Yes  The ASCVD Risk score Mikey Bussing DC Jr.,  et al., 2013) failed to calculate for the following reasons:   The patient has a prior MI or stroke diagnosis   A/P: Diabetes newly dx currently uncontrolled based on A1c but home sugars reveal improvement. Patient is able to verbalize appropriate hypoglycemia management plan. Patient is adherent with medication.  I am having to rely on patients word concerning home sugars. He did not bring his meter. Will stop metformin per pt request - he does not want to try XR metformin. With his ASCVD hx, pt would benefit from GLP-1 RA therapy or SGLT-2 inhibitor therapy. He does not wish to use an injection. Will start Jardiance. Pt counseled on importance of hydration with Jardiance use. Labs and electrolyte status WNL.   -Stop metformin.  -Start Jardiance 10 mg daily before breakfast.  -Extensively discussed pathophysiology of DM, recommended lifestyle interventions, dietary effects on glycemic control -Counseled on s/sx of and management of hypoglycemia -Next A1C anticipated 06/2019.   ASCVD risk - secondary prevention in patient with DM. Last LDL is controlled. High intensity statin indicated. Aspirin is indicated.  -Continued aspirin 81 mg  -Continued atorvastatin 80 mg.   Hypertension longstanding currently close to goal.  BP goal <130/80 mmHg. Patient is adherent with medication. -Continued current regimen  HM: flu, tetanus and Pneumovax indicated.  - Fluarix 2020-21 given - Pneumovax given - Will defer tetanus to PCP.   Written patient instructions provided.  Total time in face to face counseling 30 minutes.   Follow up Pharmacist Clinic  Visit in 3 weeks.   Benard Halsted, PharmD, [place] [phone]

## 2019-05-29 NOTE — Patient Instructions (Signed)
Thank you for coming to see me today. Please do the following:  1. Stop metformin 2. Start Jardiance 10 mg daily before breakfast.  3. Continue checking blood sugars at home.  4. Continue making the lifestyle changes we've discussed together during our visit. Diet and exercise play a significant role in improving your blood sugars.  5. Follow-up with PCP next week.    Hypoglycemia or low blood sugar:   Low blood sugar can happen quickly and may become an emergency if not treated right away.   While this shouldn't happen often, it can be brought upon if you skip a meal or do not eat enough. Also, if your insulin or other diabetes medications are dosed too high, this can cause your blood sugar to go to low.   Warning signs of low blood sugar include: 1. Feeling shaky or dizzy 2. Feeling weak or tired  3. Excessive hunger 4. Feeling anxious or upset  5. Sweating even when you aren't exercising  What to do if I experience low blood sugar? 1. Check your blood sugar with your meter. If lower than 70, proceed to step 2.  2. Treat with 3-4 glucose tablets or 3 packets of regular sugar. If these aren't around, you can try hard candy. Yet another option would be to drink 4 ounces of fruit juice or 6 ounces of REGULAR soda.  3. Re-check your sugar in 15 minutes. If it is still below 70, do what you did in step 2 again. If has come back up, go ahead and eat a snack or small meal at this time.

## 2019-06-06 ENCOUNTER — Encounter: Payer: Self-pay | Admitting: Physical Medicine & Rehabilitation

## 2019-06-06 ENCOUNTER — Encounter: Payer: Medicaid Other | Attending: Registered Nurse | Admitting: Physical Medicine & Rehabilitation

## 2019-06-06 ENCOUNTER — Other Ambulatory Visit: Payer: Self-pay

## 2019-06-06 VITALS — BP 142/81 | HR 75 | Temp 98.0°F | Ht 67.0 in | Wt 165.0 lb

## 2019-06-06 DIAGNOSIS — Z720 Tobacco use: Secondary | ICD-10-CM

## 2019-06-06 DIAGNOSIS — I6381 Other cerebral infarction due to occlusion or stenosis of small artery: Secondary | ICD-10-CM

## 2019-06-06 DIAGNOSIS — I10 Essential (primary) hypertension: Secondary | ICD-10-CM | POA: Insufficient documentation

## 2019-06-06 DIAGNOSIS — G8110 Spastic hemiplegia affecting unspecified side: Secondary | ICD-10-CM

## 2019-06-06 DIAGNOSIS — G8191 Hemiplegia, unspecified affecting right dominant side: Secondary | ICD-10-CM | POA: Insufficient documentation

## 2019-06-06 DIAGNOSIS — I639 Cerebral infarction, unspecified: Secondary | ICD-10-CM | POA: Diagnosis not present

## 2019-06-06 DIAGNOSIS — E1165 Type 2 diabetes mellitus with hyperglycemia: Secondary | ICD-10-CM | POA: Diagnosis not present

## 2019-06-06 DIAGNOSIS — R269 Unspecified abnormalities of gait and mobility: Secondary | ICD-10-CM | POA: Diagnosis not present

## 2019-06-06 MED ORDER — BACLOFEN 10 MG PO TABS: 10.0000 mg | ORAL_TABLET | Freq: Three times a day (TID) | ORAL | 1 refills | Status: DC

## 2019-06-06 MED FILL — BACLOFEN 10 MG TABLET: 10 | 30 days supply | Qty: 90 | Fill #0

## 2019-06-06 NOTE — Progress Notes (Signed)
Subjective:    Patient ID: Casey Petty, male    DOB: 1961/06/03, 58 y.o.   MRN: [id number]  HPI Male with history of HTN--no medicine 3 years presents for follow up for left basal ganglia stroke.  He was last seen in clinic by NP on 04/04/2019.  [employer] supplements history. Notes reviewed.  Since that time, he has not followed up with Neurology. He has not had therapies. BP is relatively controlled. CBGs have been controlled. Denies falls. He has been doing well, ambulating without assistive device.   Pain Inventory Average Pain 0 Pain Right Now 0 My pain is na  In the last 24 hours, has pain interfered with the following? General activity 0 Relation with others 0 Enjoyment of life 0 What TIME of day is your pain at its worst? na Sleep (in general) Fair  Pain is worse with: na Pain improves with: na Relief from Meds: na  Mobility ability to climb steps?  no do you drive?  no  Function Do you have any goals in this area?  no  Neuro/Psych No problems in this area  Prior Studies Any changes since last visit?  no  Physicians involved in your care Any changes since last visit?  no   Family History  Problem Relation Age of Onset  . Heart attack Father    Social History   Socioeconomic History  . Marital status: Single    Spouse name: Not on file  . Number of children: Not on file  . Years of education: Not on file  . Highest education level: Not on file  Occupational History  . Not on file  Social Needs  . Financial resource strain: Not on file  . Food insecurity    Worry: Not on file    Inability: Not on file  . Transportation needs    Medical: Not on file    Non-medical: Not on file  Tobacco Use  . Smoking status: Current Every Day Smoker  . Smokeless tobacco: Never Used  . Tobacco comment: Smoking 4 cigs/day  Substance and Sexual Activity  . Alcohol use: Yes    Alcohol/week: 2.0 standard drinks    Types: 2 Standard drinks or equivalent per week   . Drug use: No  . Sexual activity: Never  Lifestyle  . Physical activity    Days per week: Not on file    Minutes per session: Not on file  . Stress: Not on file  Relationships  . Social Herbalist on phone: Not on file    Gets together: Not on file    Attends religious service: Not on file    Active member of club or organization: Not on file    Attends meetings of clubs or organizations: Not on file    Relationship status: Not on file  Other Topics Concern  . Not on file  Social History Narrative  . Not on file   No past surgical history on file. Past Medical History:  Diagnosis Date  . Hypertension    BP (!) 142/81   Pulse 75   Temp 98 F (36.7 C)   Ht 5\' 7"  (1.702 m)   Wt 165 lb (74.8 kg)   SpO2 97%   BMI 25.84 kg/m   Opioid Risk Score:   Fall Risk Score:  `1  Depression screen PHQ 2/9  Depression screen [facility] 2/9 05/01/2019 03/27/2019 06/27/2014  Decreased Interest 0 0 0  Down, Depressed, Hopeless 0 0 0  PHQ - 2 Score 0 0 0     Review of Systems  Constitutional: Negative.   HENT: Negative.   Eyes: Negative.   Respiratory: Negative.   Cardiovascular: Negative.   Gastrointestinal: Negative.   Endocrine: Negative.   Genitourinary: Negative.   Musculoskeletal: Negative.   Skin: Negative.   Allergic/Immunologic: Negative.   Neurological: Positive for weakness. Negative for numbness.  Hematological: Negative.   Psychiatric/Behavioral: Negative.        Objective:   Physical Exam  Constitutional: No distress . Vital signs reviewed. HENT: Normocephalic.  Atraumatic. Eyes: EOMI. No discharge. Cardiovascular: No JVD. Respiratory: Normal effort.  No stridor. GI: Non-distended. Skin: Warm and dry.  Intact. Psych: Impulsive Musc: No edema in extremities.  No tenderness in extremities. Neurologic: Alert.  Gait: spastic hemiplegic Dysarthria Motor: LUE/LLE: Grossly 5/5 RUE: Shoulder abduction 3/5, elbow flexion/extension 3+/5, handgrip 2+/5  Right lower extremity: Hip flexion, knee extension 4/5, ankle dorsiflexion 0/5    Assessment & Plan:  Male with history of HTN--no medicine 3 years presents for follow up for left basal ganglia stroke.  1. 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   Cont HEP  Will order Baclofen 10 TID  Follow up with Neurology, needs appointment  2.  Antithrombotics:  Cont ASA  3. HTN:   Relatively controlled  Cont follow up with PCP  4.  New diagnosis of diabetes mellitus with hyperglycemia  Controlled per patient  5. Gait abnormality  Cont HEP  Cont ambulation with AFO  6. Tobacco abuse  2/day, encouraged abstinence

## 2019-06-06 NOTE — Patient Instructions (Addendum)
Please follow up:  1. [facility]. Call.   Why: for post stroke follow up Contact information: [address]     [place] [phone]   2. [facility] [phone]

## 2019-06-07 ENCOUNTER — Ambulatory Visit: Payer: Medicaid Other | Admitting: Family Medicine

## 2019-06-09 ENCOUNTER — Encounter: Payer: Self-pay | Admitting: Family Medicine

## 2019-06-09 DIAGNOSIS — I6930 Unspecified sequelae of cerebral infarction: Secondary | ICD-10-CM | POA: Insufficient documentation

## 2019-06-09 DIAGNOSIS — I639 Cerebral infarction, unspecified: Secondary | ICD-10-CM | POA: Diagnosis not present

## 2019-06-11 ENCOUNTER — Telehealth: Payer: Self-pay | Admitting: Family Medicine

## 2019-06-11 NOTE — Telephone Encounter (Signed)
Kadija from [facility] called requesting an update on some paperwork she faxed over, please follow up if paperwork needs to be resent  [phone]

## 2019-06-11 NOTE — Telephone Encounter (Signed)
Spoke with Eliezer Mccoy and she stated she was checking up on paperwork she faxed over to Dr. Chapman Fitch for this patient on 05-30-19 and 06-05-19. Informed Eliezer Mccoy that forms may be with provider but to fax another one just to be on the safe side and she agreed. Staff informed Eliezer Mccoy that message will be sent to provider and she agreed.

## 2019-06-11 NOTE — Telephone Encounter (Signed)
Paperwork was received and patient had appt to review.

## 2019-06-20 ENCOUNTER — Other Ambulatory Visit: Payer: Self-pay

## 2019-06-20 ENCOUNTER — Ambulatory Visit: Payer: Medicaid Other | Attending: Family Medicine | Admitting: Family Medicine

## 2019-06-20 ENCOUNTER — Encounter: Payer: Self-pay | Admitting: Family Medicine

## 2019-06-20 DIAGNOSIS — E1165 Type 2 diabetes mellitus with hyperglycemia: Secondary | ICD-10-CM

## 2019-06-20 DIAGNOSIS — R2689 Other abnormalities of gait and mobility: Secondary | ICD-10-CM | POA: Diagnosis not present

## 2019-06-20 DIAGNOSIS — I69314 Frontal lobe and executive function deficit following cerebral infarction: Secondary | ICD-10-CM

## 2019-06-20 DIAGNOSIS — I10 Essential (primary) hypertension: Secondary | ICD-10-CM | POA: Diagnosis not present

## 2019-06-20 DIAGNOSIS — I69398 Other sequelae of cerebral infarction: Secondary | ICD-10-CM

## 2019-06-20 DIAGNOSIS — I639 Cerebral infarction, unspecified: Secondary | ICD-10-CM

## 2019-06-20 DIAGNOSIS — I6930 Unspecified sequelae of cerebral infarction: Secondary | ICD-10-CM

## 2019-06-20 DIAGNOSIS — I69359 Hemiplegia and hemiparesis following cerebral infarction affecting unspecified side: Secondary | ICD-10-CM

## 2019-06-20 DIAGNOSIS — G8191 Hemiplegia, unspecified affecting right dominant side: Secondary | ICD-10-CM

## 2019-06-20 DIAGNOSIS — Z7409 Other reduced mobility: Secondary | ICD-10-CM

## 2019-06-20 DIAGNOSIS — I6381 Other cerebral infarction due to occlusion or stenosis of small artery: Secondary | ICD-10-CM

## 2019-06-20 DIAGNOSIS — G8110 Spastic hemiplegia affecting unspecified side: Secondary | ICD-10-CM

## 2019-06-20 NOTE — Progress Notes (Signed)
Virtual Visit via Telephone Note  I connected with Casey Petty on 07/19/19 at  3:30 PM EST by telephone and verified that I am speaking with the correct person using two identifiers.   I discussed the limitations, risks, security and privacy concerns of performing an evaluation and management service by telephone and the availability of in person appointments. I also discussed with the patient that there may be a patient responsible charge related to this service. The patient expressed understanding and agreed to proceed.  Patient Location: Home Provider Location: [facility] Others participating in call: none   History of Present Illness:       58 yo male with spastic hemiparesis of the right side with impairment in gait, balance and mobility status post CVA, uncontrolled type 2 diabetes and hypertension who reports the need for increased help with home activities such as bathing, getting dressed, preparing meals/cooking.  He reports that he currently uses a wheelchair to help get around his home as he is unsteady with attempts to use a walker and he is afraid that he might fall.  He would like to see if there is a home health company that could provide an aide to help him with bathing, getting dressed, household chores and meal preparation/cooking.  He additionally will need a note so that he does not have to participate in jury duty as he does not feel that he is capable of this at this time        He reports that he is taking his blood pressure medication on a daily basis and denies any headaches or dizziness related to his blood pressure.  He has taken his medication to treat his diabetes.  He denies any increased thirst at this time but has occasional urinary frequency.  Fasting blood sugars in the 130s to 140s generally.  No symptoms suggestive of a new stroke.  Past Medical History:  Diagnosis Date  . Hypertension     Past Surgical History:  Procedure Laterality Date  . NO PAST  SURGERIES      Family History  Problem Relation Age of Onset  . Heart attack Father   . Cancer Neg Hx     Social History   Tobacco Use  . Smoking status: Current Every Day Smoker  . Smokeless tobacco: Never Used  . Tobacco comment: Smoking 4 cigs/day  Substance Use Topics  . Alcohol use: Yes    Alcohol/week: 2.0 standard drinks    Types: 2 Standard drinks or equivalent per week  . Drug use: No     Allergies  Allergen Reactions  . Lisinopril Swelling    Angioedema- lips/face       Observations/Objective: No vital signs or physical exam conducted as visit was done via telephone  Assessment and Plan: 1. Spastic hemiparesis ([place]) 2. History of CVA with residual deficit 5. Impaired functional mobility, balance, gait and endurance Will provide letter for patient to be excused from jury duty due to his ongoing issues with gait and mobility as well as spastic hemiparesis status post CVA.  He should continue follow-up with physical medicine and rehabilitation.  Patient is interested and home health services and I will contact our RN case manager to get in touch with the patient for assistance with obtaining an agency to evaluate the need for additional home health services with ADLs.  3. Uncontrolled type 2 diabetes mellitus with hyperglycemia ([place]) Patient with hemoglobin A1c of 7.9 in August and he has been asked to  schedule follow-up appointment regarding his diabetes.  He reports that his blood sugars have been in the 130s to 140 fasting and discussed goal of fasting blood sugars less than 120 along with goal of blood sugars between 140-160 within 2 hours of a meal.  Low carbohydrate diet and medication compliance as well as continued monitoring of blood sugars encouraged.  4. Essential hypertension Encouraged continued compliance with daily medications with blood pressure goal less than 140/90 and discussed importance of blood pressure control due to his prior stroke  history.    Follow Up Instructions:Return in about 2 months (around 08/20/2019) for DM/HTN- and as needed.    I discussed the assessment and treatment plan with the patient. The patient was provided an opportunity to ask questions and all were answered. The patient agreed with the plan and demonstrated an understanding of the instructions.   The patient was advised to call back or seek an in-person evaluation if the symptoms worsen or if the condition fails to improve as anticipated.  I provided 14 minutes of non-face-to-face time during this encounter.   [name], MD

## 2019-06-20 NOTE — Progress Notes (Signed)
Patient verified DOB Patient has eaten today. Patient has taken medication today. Patient denies pain at this time. Patient states he needs assistance with ADL's including dressing, bathing, cooking, ambulates with a wheelchair. CBG today was 125 before eating. Patient needs letter stating he is not able to serve on the jury committee in December based on his conditions/

## 2019-06-27 MED FILL — CARVEDILOL 12.5 MG TABLET: 12.5 | 30 days supply | Qty: 60 | Fill #2

## 2019-06-27 MED FILL — ATORVASTATIN 80 MG TABLET: 80 | 30 days supply | Qty: 30 | Fill #2

## 2019-06-28 MED FILL — BACLOFEN 10 MG TABLET: 10 | 30 days supply | Qty: 90 | Fill #1

## 2019-06-28 MED FILL — hydrALAZINE HCL 100 MG TABS: 100 | 30 days supply | Qty: 90 | Fill #1

## 2019-06-28 MED FILL — AMLODIPINE BESYLATE 10 MG T: 10 | 30 days supply | Qty: 30 | Fill #2

## 2019-07-02 ENCOUNTER — Encounter: Payer: Self-pay | Admitting: Physical Medicine & Rehabilitation

## 2019-07-02 ENCOUNTER — Telehealth: Payer: Self-pay

## 2019-07-02 ENCOUNTER — Encounter: Payer: Medicaid Other | Attending: Registered Nurse | Admitting: Physical Medicine & Rehabilitation

## 2019-07-02 ENCOUNTER — Other Ambulatory Visit: Payer: Self-pay

## 2019-07-02 VITALS — BP 135/85 | HR 68 | Temp 97.5°F | Ht 67.0 in | Wt 165.0 lb

## 2019-07-02 DIAGNOSIS — R269 Unspecified abnormalities of gait and mobility: Secondary | ICD-10-CM | POA: Diagnosis not present

## 2019-07-02 DIAGNOSIS — G8191 Hemiplegia, unspecified affecting right dominant side: Secondary | ICD-10-CM | POA: Diagnosis present

## 2019-07-02 DIAGNOSIS — I639 Cerebral infarction, unspecified: Secondary | ICD-10-CM | POA: Insufficient documentation

## 2019-07-02 DIAGNOSIS — Z720 Tobacco use: Secondary | ICD-10-CM | POA: Diagnosis not present

## 2019-07-02 DIAGNOSIS — I10 Essential (primary) hypertension: Secondary | ICD-10-CM | POA: Diagnosis not present

## 2019-07-02 DIAGNOSIS — G8111 Spastic hemiplegia affecting right dominant side: Secondary | ICD-10-CM | POA: Diagnosis not present

## 2019-07-02 DIAGNOSIS — G8110 Spastic hemiplegia affecting unspecified side: Secondary | ICD-10-CM | POA: Diagnosis not present

## 2019-07-02 DIAGNOSIS — E1165 Type 2 diabetes mellitus with hyperglycemia: Secondary | ICD-10-CM | POA: Insufficient documentation

## 2019-07-02 MED ORDER — BACLOFEN 20 MG PO TABS: 20.0000 mg | ORAL_TABLET | Freq: Three times a day (TID) | ORAL | 1 refills | Status: DC

## 2019-07-02 NOTE — Progress Notes (Signed)
Subjective:    Patient ID: Casey Petty, male    DOB: [birth year], 58 y.o.   MRN: [id number]  HPI Male with history of HTN--no medicine 3 years presents for follow up for left basal ganglia stroke.  Last clinic visit on 06/06/2019.  Wife supplements history. Since that time, pt states notes benefit with Baclofen.  He has not made an appointment with Neurology. BP is controlled. Denies falls. Smoking 1 cig/day.  Pain Inventory Average Pain 0 Pain Right Now 0 My pain is na  In the last 24 hours, has pain interfered with the following? General activity 0 Relation with others 0 Enjoyment of life 0 What TIME of day is your pain at its worst? na Sleep (in general) Fair  Pain is worse with: na Pain improves with: na Relief from Meds: na  Mobility ability to climb steps?  no do you drive?  no Do you have any goals in this area?  yes  Function Do you have any goals in this area?  no  Neuro/Psych No problems in this area  Prior Studies Any changes since last visit?  no  Physicians involved in your care Any changes since last visit?  no   Family History  Problem Relation Age of Onset  . Heart attack Father   . Cancer Neg Hx    Social History   Socioeconomic History  . Marital status: Single    Spouse name: Not on file  . Number of children: Not on file  . Years of education: Not on file  . Highest education level: Not on file  Occupational History  . Not on file  Social Needs  . Financial resource strain: Not on file  . Food insecurity    Worry: Not on file    Inability: Not on file  . Transportation needs    Medical: Not on file    Non-medical: Not on file  Tobacco Use  . Smoking status: Current Every Day Smoker  . Smokeless tobacco: Never Used  . Tobacco comment: Smoking 4 cigs/day  Substance and Sexual Activity  . Alcohol use: Yes    Alcohol/week: 2.0 standard drinks    Types: 2 Standard drinks or equivalent per week  . Drug use: No  . Sexual  activity: Never  Lifestyle  . Physical activity    Days per week: Not on file    Minutes per session: Not on file  . Stress: Not on file  Relationships  . Social Herbalist on phone: Not on file    Gets together: Not on file    Attends religious service: Not on file    Active member of club or organization: Not on file    Attends meetings of clubs or organizations: Not on file    Relationship status: Not on file  Other Topics Concern  . Not on file  Social History Narrative  . Not on file   Past Surgical History:  Procedure Laterality Date  . NO PAST SURGERIES     Past Medical History:  Diagnosis Date  . Hypertension    BP 135/85   Pulse 68   Temp (!) 97.5 F (36.4 C)   Ht 5\' 7"  (1.702 m)   Wt 165 lb (74.8 kg)   SpO2 95%   BMI 25.84 kg/m   Opioid Risk Score:   Fall Risk Score:  `1  Depression screen PHQ 2/9  Depression screen [facility] 2/9 06/20/2019 05/01/2019 03/27/2019 06/27/2014  Decreased Interest  0 0 0 0  Down, Depressed, Hopeless 0 0 0 0  PHQ - 2 Score 0 0 0 0   Review of Systems  Constitutional: Negative.   HENT: Negative.   Eyes: Negative.   Respiratory: Negative.   Cardiovascular: Negative.   Gastrointestinal: Negative.   Endocrine: Negative.   Genitourinary: Negative.   Musculoskeletal: Positive for gait problem.  Skin: Negative.   Allergic/Immunologic: Negative.   Neurological: Positive for weakness.  Hematological: Negative.   Psychiatric/Behavioral: Negative.       Objective:   Physical Exam  Constitutional: No distress . Vital signs reviewed. HENT: Normocephalic.  Atraumatic. Eyes: EOMI. No discharge. Cardiovascular: No JVD. Respiratory: Normal effort.  No stridor. GI: Non-distended. Skin: Warm and dry.  Intact. Psych: Normal mood.  Normal behavior. Musc: No edema in extremities.  No tenderness in extremities. Neurologic: Alert Gait: spastic hemiplegic Dysarthria Motor: LUE/LLE: Grossly 5/5 RUE: Shoulder abduction 3/5,  elbow flexion/extension 3+/5, handgrip 3/5 with apraxia Right lower extremity: Hip flexion, knee extension 4/5, ankle dorsiflexion 0/5 MAS: right elbow flexors 1+/4, wrist flexors, finger flexors 2/4    Assessment & Plan:  Male with history of HTN--no medicine 3 years presents for follow up for left basal ganglia stroke.  1. 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   Cont HEP  Will increase Baclofen 20 TID  Follow up with Neurology, needs appointment, reminded again  Discussed risks/benefits of Botox, will plan for 100 units to right biceps, FCR, FCU, FDS, FDP  2. HTN:   Relatively controlled  Cont follow up with PCP  3. Gait abnormality  Cont HEP  Cont ambulation with AFO  4. Tobacco abuse  2/day, encouraged abstinence

## 2019-07-02 NOTE — Telephone Encounter (Signed)
Order for [facility] faxed to [facility]

## 2019-07-09 DIAGNOSIS — I639 Cerebral infarction, unspecified: Secondary | ICD-10-CM | POA: Diagnosis not present

## 2019-07-09 MED FILL — BACLOFEN 20 MG TABLET: 20 | 30 days supply | Qty: 90 | Fill #0

## 2019-07-19 ENCOUNTER — Telehealth: Payer: Self-pay | Admitting: Family Medicine

## 2019-07-19 MED FILL — BACLOFEN 20 MG TABLET: 20 | 30 days supply | Qty: 90 | Fill #0

## 2019-07-19 NOTE — Telephone Encounter (Signed)
Patient called about the letter you said they would have on 06/20/2019

## 2019-07-19 NOTE — Telephone Encounter (Signed)
Letter completed and was left at the front desk so that patient could be contacted for pickup order to have someone pick up the letter for him

## 2019-07-22 ENCOUNTER — Telehealth: Payer: Self-pay

## 2019-07-22 NOTE — Telephone Encounter (Signed)
Call placed to [facility], spoke to [place] who confirmed that the patient has been assessed and accepted for [facility]

## 2019-07-24 ENCOUNTER — Other Ambulatory Visit: Payer: Self-pay

## 2019-07-24 ENCOUNTER — Encounter: Payer: Self-pay | Admitting: Family Medicine

## 2019-07-24 ENCOUNTER — Ambulatory Visit: Payer: Medicaid Other | Attending: Family Medicine | Admitting: Family Medicine

## 2019-07-24 VITALS — BP 164/92 | HR 71 | Temp 98.2°F | Resp 18 | Ht 67.0 in | Wt 160.0 lb

## 2019-07-24 DIAGNOSIS — G8191 Hemiplegia, unspecified affecting right dominant side: Secondary | ICD-10-CM

## 2019-07-24 DIAGNOSIS — Z7984 Long term (current) use of oral hypoglycemic drugs: Secondary | ICD-10-CM | POA: Diagnosis not present

## 2019-07-24 DIAGNOSIS — I6930 Unspecified sequelae of cerebral infarction: Secondary | ICD-10-CM | POA: Diagnosis not present

## 2019-07-24 DIAGNOSIS — E785 Hyperlipidemia, unspecified: Secondary | ICD-10-CM

## 2019-07-24 DIAGNOSIS — I10 Essential (primary) hypertension: Secondary | ICD-10-CM

## 2019-07-24 DIAGNOSIS — Z8249 Family history of ischemic heart disease and other diseases of the circulatory system: Secondary | ICD-10-CM | POA: Insufficient documentation

## 2019-07-24 DIAGNOSIS — E1169 Type 2 diabetes mellitus with other specified complication: Secondary | ICD-10-CM | POA: Diagnosis not present

## 2019-07-24 DIAGNOSIS — Z7982 Long term (current) use of aspirin: Secondary | ICD-10-CM | POA: Diagnosis not present

## 2019-07-24 DIAGNOSIS — Z79899 Other long term (current) drug therapy: Secondary | ICD-10-CM | POA: Diagnosis not present

## 2019-07-24 DIAGNOSIS — F17210 Nicotine dependence, cigarettes, uncomplicated: Secondary | ICD-10-CM | POA: Insufficient documentation

## 2019-07-24 DIAGNOSIS — E1165 Type 2 diabetes mellitus with hyperglycemia: Secondary | ICD-10-CM | POA: Diagnosis present

## 2019-07-24 DIAGNOSIS — I69351 Hemiplegia and hemiparesis following cerebral infarction affecting right dominant side: Secondary | ICD-10-CM | POA: Diagnosis not present

## 2019-07-24 LAB — POCT GLYCOSYLATED HEMOGLOBIN (HGB A1C): Hemoglobin A1C: 8.4 % — AB (ref 4.0–5.6)

## 2019-07-24 LAB — GLUCOSE, POCT (MANUAL RESULT ENTRY): POC Glucose: 177 mg/dL — AB (ref 70–99)

## 2019-07-24 MED ORDER — METFORMIN HCL 500 MG PO TABS: 500.0000 mg | ORAL_TABLET | Freq: Two times a day (BID) | ORAL | 1 refills | Status: DC

## 2019-07-24 MED ORDER — CARVEDILOL 12.5 MG PO TABS: 12.5000 mg | ORAL_TABLET | Freq: Two times a day (BID) | ORAL | 3 refills | Status: DC

## 2019-07-24 MED ORDER — JARDIANCE 10 MG PO TABS: 10.0000 mg | ORAL_TABLET | Freq: Every day | ORAL | 1 refills | Status: DC

## 2019-07-24 MED ORDER — ATORVASTATIN CALCIUM 80 MG PO TABS: ORAL_TABLET | ORAL | 3 refills | Status: DC

## 2019-07-24 MED ORDER — HYDRALAZINE HCL 100 MG PO TABS: 100.0000 mg | ORAL_TABLET | Freq: Three times a day (TID) | ORAL | 3 refills | Status: DC

## 2019-07-24 MED ORDER — AMLODIPINE BESYLATE 10 MG PO TABS: 10.0000 mg | ORAL_TABLET | Freq: Every day | ORAL | 3 refills | Status: DC

## 2019-07-24 MED FILL — CARVEDILOL 12.5 MG TABLET: 12.5 | 90 days supply | Qty: 180 | Fill #0

## 2019-07-24 MED FILL — hydrALAZINE HCL 100 MG TABS: 100 | 90 days supply | Qty: 270 | Fill #0

## 2019-07-24 MED FILL — metFORMIN HCL 500 MG TABS: 500 | 90 days supply | Qty: 180 | Fill #0

## 2019-07-24 MED FILL — ATORVASTATIN 80 MG TABLET: 80 | 90 days supply | Qty: 90 | Fill #0

## 2019-07-24 MED FILL — AMLODIPINE BESYLATE 10 MG T: 10 | 90 days supply | Qty: 90 | Fill #0

## 2019-07-24 NOTE — Progress Notes (Signed)
Subjective:  Patient ID: Casey Petty, male    DOB: [birth year]  Age: 58 y.o. MRN: [id number]  CC: No chief complaint on file.   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        He reports that he feels well at today's visit. He has seen his physical medicine/rehab doctor and is scheduled to have injections in follow-up of his right arm and leg stiffness. He does have a funny numbness/tingling in his right upper and lower extremity. He is taking his BP medication daily and denies any headaches or dizziness related to his BP. He is taking his cholesterol medication and denies any issues with increased muscle aches with use of medication. He is taking taking Jardiance for his DM and has had no dysuria or penile rash/itching. He has had no increased thirst or blurred vision. He is able to ambulate without an assistive device and does have balance issues but has had no falls. He reports that he is only smoking one cigarette daily but is note sure that he can stop smoking at least one cigarette per day.   Past Medical History:  Diagnosis Date  . Hypertension     Past Surgical History:  Procedure Laterality Date  . NO PAST SURGERIES      Family History  Problem Relation Age of Onset  . Heart attack Father   . Cancer Neg Hx     Social History   Tobacco Use  . Smoking status: Current Every Day Smoker  . Smokeless tobacco: Never Used  . Tobacco comment: Smoking 4 cigs/day  Substance Use Topics  . Alcohol use: Yes    Alcohol/week: 2.0 standard drinks    Types: 2 Standard drinks or equivalent per week    ROS Review of Systems  Constitutional: Negative for chills and fever.  HENT: Negative for sore throat and trouble swallowing.   Eyes: Negative for photophobia and visual  disturbance.  Respiratory: Negative for cough and shortness of breath.   Cardiovascular: Negative for chest pain, palpitations and leg swelling.  Gastrointestinal: Negative for abdominal pain, blood in stool, constipation, diarrhea and nausea.  Endocrine: Negative for cold intolerance, heat intolerance, polydipsia, polyphagia and polyuria.  Genitourinary: Negative for dysuria and frequency.  Musculoskeletal: Positive for arthralgias and gait problem.  Neurological: Negative for dizziness and headaches.  Hematological: Negative for adenopathy. Does not bruise/bleed easily.  Psychiatric/Behavioral: Negative for self-injury and suicidal ideas. The patient is not nervous/anxious.     Objective:   Today's Vitals: BP (!) 164/92 (BP Location: Left Arm, Patient Position: Sitting, Cuff Size: Normal)   Pulse 71   Temp 98.2 F (36.8 C) (Oral)   Resp 18   Ht '5\' 7"'$  (1.702 m)   Wt 160 lb (72.6 kg)   SpO2 100%   BMI 25.06 kg/m   Physical Exam Nursing note reviewed.  Constitutional:      General: He is not in acute distress.    Appearance: Normal appearance.     Comments: WNWD male in NAD sitting in wheelchair in NAD wearing mask as per office COVID-19 protocol; accompanied by wife/girlfriend at today's visit.  Patient wearing brace to right hand to prevent contracture  Cardiovascular:     Rate and Rhythm: Normal rate and regular rhythm.     Pulses:  Dorsalis pedis pulses are 2+ on the right side and 1+ on the left side.       Posterior tibial pulses are 2+ on the right side and 1+ on the left side.  Pulmonary:     Effort: Pulmonary effort is normal.     Breath sounds: Normal breath sounds.  Abdominal:     Palpations: Abdomen is soft.     Tenderness: There is no abdominal tenderness. There is no right CVA tenderness, left CVA tenderness, guarding or rebound.  Musculoskeletal:        General: No tenderness.     Cervical back: Normal range of motion and neck supple.     Right lower  leg: No edema.     Left lower leg: No edema.     Right foot: Normal range of motion. No Charcot foot.     Left foot: Normal range of motion. No deformity or Charcot foot.       Feet:  Feet:     Right foot:     Skin integrity: Dry skin present. No ulcer, blister, skin breakdown, erythema, warmth, callus or fissure.     Toenail Condition: Right toenails are normal.     Left foot:     Skin integrity: Callus and dry skin present. No ulcer, blister, skin breakdown, erythema, warmth or fissure.     Toenail Condition: Left toenails are normal.  Lymphadenopathy:     Cervical: No cervical adenopathy.  Skin:    General: Skin is warm and dry.  Neurological:     Mental Status: He is alert and oriented to person, place, and time.     Motor: Weakness present.     Comments: RUE with 3/5 strength and spasticity; 4/5 strength right LE but unable to dorsiflex/plantar flex at the right ankle (not wearing brace at today's visit)  Psychiatric:        Mood and Affect: Mood normal.        Behavior: Behavior normal.     Assessment & Plan:  1. Uncontrolled type 2 diabetes mellitus with hyperglycemia (HCC) Hemoglobin A1c is 8.4 at today's visit which is increased from prior A1c of 7.9.  We will add Metformin 500 mg twice daily to his current Jardiance.  Patient will have comprehensive metabolic panel at today's visit.  He has had normal microalbumin creatinine ratio a few months ago.  He is encouraged to continue monitoring his blood sugars on a regular basis with goal fasting blood sugar of 120 or less.  Diabetic foot care discussed and referral to podiatry.  Educational material on diabetic self-care provided as part of AVS-after visit summary. - HgB A1c - Glucose (CBG) - empagliflozin (JARDIANCE) 10 MG TABS tablet; Take 10 mg by mouth daily before breakfast.  Dispense: 90 tablet; Refill: 1 - Comprehensive metabolic panel - Ambulatory referral to Podiatry - metFORMIN (GLUCOPHAGE) 500 MG tablet; Take 1  tablet (500 mg total) by mouth 2 (two) times daily with a meal.  Dispense: 180 tablet; Refill: 1  2. History of CVA with residual deficit; right spastic hemiparesis Patient with history of CVA with right hemiparesis.  He reports that he is scheduled for Botox injections to help with spasticity and this is also mentioned in his recent note with physical medicine and rehab which was reviewed prior to seeing the patient today.  Discussed importance of controlling hypertension, lipids, diabetes and compliance with medications and aspirin therapy due to his prior CVA and to help avoid recurrence.  Podiatry referral  as patient is diabetic.  Patient also reports that he normally wears a brace to the right lower leg and foot but did not have this on at today's visit.  Patient reports that he is down to 1 cigarette/day and discussed the importance of complete smoking cessation as smoking contributes to peripheral vascular disease/cardiovascular/cerebrovascular disease. - atorvastatin (LIPITOR) 80 MG tablet; One pill in the evenings to lower cholesterol  Dispense: 90 tablet; Refill: 3 - Ambulatory referral to Podiatry  3. Essential hypertension Blood pressure remains above goal of 140/80 or less.  Blood pressure recheck at today's visit 160/82.  He reports compliance with medications and discussed importance of compliance to control blood pressure and prevent recurrent CVA.  Refills provided of his current amlodipine, carvedilol and hydralazine.  Low-sodium diet encouraged. - amLODipine (NORVASC) 10 MG tablet; Take 1 tablet (10 mg total) by mouth daily. To lower blood pressure  Dispense: 90 tablet; Refill: 3 - carvedilol (COREG) 12.5 MG tablet; Take 1 tablet (12.5 mg total) by mouth 2 (two) times daily with a meal. For blood pressure  Dispense: 180 tablet; Refill: 3 - hydrALAZINE (APRESOLINE) 100 MG tablet; Take 1 tablet (100 mg total) by mouth every 8 (eight) hours. To lower blood pressure  Dispense: 270 tablet;  Refill: 3  4. Encounter for long-term (current) use of medications Comprehensive metabolic panel in follow-up of long-term use of medications including high-dose statin therapy and Jardiance for diabetes as well as blood pressure medications - Comprehensive metabolic panel  5. Hyperlipidemia associated with type 2 diabetes mellitus ([place]) Currently on high dose atorvastatin and most recent lipid panel within normal with LDL of 57 with goal LDL of 7 or less. CMET in follow-up of long term statin use - Comprehensive metabolic panel    Outpatient Encounter Medications as of 07/24/2019  Medication Sig  . acetaminophen (TYLENOL) 325 MG tablet Take 1-2 tablets (325-650 mg total) by mouth every 4 (four) hours as needed for mild pain.  Marland Kitchen amLODipine (NORVASC) 10 MG tablet Take 1 tablet (10 mg total) by mouth daily.  Marland Kitchen aspirin 81 MG EC tablet Take 1 tablet (81 mg total) by mouth daily.  Marland Kitchen atorvastatin (LIPITOR) 80 MG tablet Take 1 tablet (80 mg total) by mouth daily at 6 PM.  . baclofen (LIORESAL) 20 MG tablet Take 1 tablet (20 mg total) by mouth 3 (three) times daily.  . Blood Glucose Monitoring Suppl (TRUE METRIX METER) w/Device KIT Check blood sugar fasting and before meals and again if pt feels bad (symptoms of hypo).  . carvedilol (COREG) 12.5 MG tablet Take 1 tablet (12.5 mg total) by mouth 2 (two) times daily with a meal.  . empagliflozin (JARDIANCE) 10 MG TABS tablet Take 10 mg by mouth daily before breakfast.  . famotidine (PEPCID) 20 MG tablet Take 1 tablet (20 mg total) by mouth daily.  Marland Kitchen glucose blood (TRUE METRIX BLOOD GLUCOSE TEST) test strip Use as instructed  . hydrALAZINE (APRESOLINE) 100 MG tablet Take 1 tablet (100 mg total) by mouth every 8 (eight) hours.  . nicotine (NICODERM CQ - DOSED IN MG/24 HOURS) 21 mg/24hr patch Place 1 patch (21 mg total) onto the skin daily.  . TRUEplus Lancets 28G MISC Check blood sugar fasting and before meals and again if pt feels bad (symptoms of  hypo).   No facility-administered encounter medications on file as of 07/24/2019.    An After Visit Summary was printed and given to the patient.  Follow-up: Return in about 4 months (around  11/22/2019) for chronic issues; sooner if needed.   -Message sent to patient's rehabilitation doctor to see if patient will benefit from assistive device such as a cane/hemi-walker to help with balance with ambulation.  Will also mention that patient may not be aware that insurance will cover assistive device.  [name] MD

## 2019-07-24 NOTE — Patient Instructions (Signed)
Type 2 Diabetes Mellitus, Self Care, Adult  Caring for yourself after you have been diagnosed with type 2 diabetes (type 2 diabetes mellitus) means keeping your blood sugar (glucose) under control with a balance of:   Nutrition.   Exercise.   Lifestyle changes.   Medicines or insulin, if necessary.   Support from your team of health care providers and others.  The following information explains what you need to know to manage your diabetes at home.  What are the risks?  Having diabetes can put you at risk for other long-term (chronic) conditions, such as heart disease and kidney disease. Your health care provider may prescribe medicines to help prevent complications from diabetes. These medicines may include:   Aspirin.   Medicine to lower cholesterol.   Medicine to control blood pressure.  How to monitor blood glucose     Check your blood glucose every day, as often as told by your health care provider.   Have your A1c (hemoglobin A1c) level checked two or more times a year, or as often as told by your health care provider.  Your health care provider will set individualized treatment goals for you. Generally, the goal of treatment is to maintain the following blood glucose levels:   Before meals (preprandial): 80-130 mg/dL (4.4-7.2 mmol/L).   After meals (postprandial): below 180 mg/dL (10 mmol/L).   A1c level: less than 7%.  How to manage hyperglycemia and hypoglycemia  Hyperglycemia symptoms  Hyperglycemia, also called high blood glucose, occurs when blood glucose is too high. Make sure you know the early signs of hyperglycemia, such as:   Increased thirst.   Hunger.   Feeling very tired.   Needing to urinate more often than usual.   Blurry vision.  Hypoglycemia symptoms  Hypoglycemia, also called low blood glucose, occurswith a blood glucose level at or below 70 mg/dL (3.9 mmol/L). The risk for hypoglycemia increases during or after exercise, during sleep, during illness, and when skipping  meals or not eating for a long time (fasting).  It is important to know the symptoms of hypoglycemia and treat it right away. Always have a 15-gram rapid-acting carbohydrate snack with you to treat low blood glucose. Family members and close friends should also know the symptoms and should understand how to treat hypoglycemia, in case you are not able to treat yourself. Symptoms may include:   Hunger.   Anxiety.   Sweating and feeling clammy.   Confusion.   Dizziness or feeling light-headed.   Sleepiness.   Nausea.   Increased heart rate.   Headache.   Blurry vision.   Irritability.   A change in coordination.   Tingling or numbness around the mouth, lips, or tongue.   Restless sleep.   Fainting.   Seizure.  Treating hypoglycemia  If you are alert and able to swallow safely, follow the 15:15 rule:   Take 15 grams of a rapid-acting carbohydrate. Talk with your health care provider about how much you should take.   Rapid-acting options include:  ? Glucose pills (take 15 grams).  ? 6-8 pieces of hard candy.  ? 4-6 oz (120-150 mL) of fruit juice.  ? 4-6 oz (120-150 mL) of regular (not diet) soda.  ? 1 Tbsp (15 mL) honey or sugar.   Check your blood glucose 15 minutes after you take the carbohydrate.   If the repeat blood glucose level is still at or below 70 mg/dL (3.9 mmol/L), take 15 grams of a carbohydrate again.     is an emergency. Do not wait to see if the symptoms will go away. Get medical help right away. Call your local emergency services (911 in the [place] If you have severe hypoglycemia and you cannot eat or drink, you may need an injection of  glucagon. A family member or close friend should learn how to check your blood glucose and how to give you a glucagon injection. Ask your health care provider if you need to have an emergency glucagon injection kit available. Severe hypoglycemia may need to be treated in a hospital. The treatment may include getting glucose through an IV. You may also need treatment for the cause of your hypoglycemia. Follow these instructions at home: Take diabetes medicines as told  If your health care provider prescribed insulin or diabetes medicines, take them every day.  Do not run out of insulin or other diabetes medicines that you take. Plan ahead so you always have these available.  If you use insulin, adjust your dosage based on how physically active you are and what foods you eat. Your health care provider will tell you how to adjust your dosage. Make healthy food choices  The things that you eat and drink affect your blood glucose and your insulin dosage. Making good choices helps to control your diabetes and prevent other health problems. A healthy meal plan includes eating lean proteins, complex carbohydrates, fresh fruits and vegetables, low-fat dairy products, and healthy fats. Make an appointment to see a diet and nutrition specialist (registered dietitian) to help you create an eating plan that is right for you. Make sure that you:  Follow instructions from your health care provider about eating or drinking restrictions.  Drink enough fluid to keep your urine pale yellow.  Keep a record of the carbohydrates that you eat. Do this by reading food labels and learning the standard serving sizes of foods.  Follow your sick day plan whenever you cannot eat or drink as usual. Make this plan in advance with your health care provider.  Stay active Exercise regularly, as told by your health care provider. This may include:  Stretching and doing strength exercises, such as yoga or weightlifting, 2 or  more times a week.  Doing 150 minutes or more of moderate-intensity or vigorous-intensity exercise each week. This could be brisk walking, biking, or water aerobics. ? Spread out your activity over 3 or more days of the week. ? Do not go more than 2 days in a row without doing some kind of physical activity. When you start a new exercise or activity, work with your health care provider to adjust your insulin, medicines, or food intake as needed. Make healthy lifestyle choices  Do not use any tobacco products, such as cigarettes, chewing tobacco, and e-cigarettes. If you need help quitting, ask your health care provider.  If your health care provider says that alcohol is safe for you, limit alcohol intake to no more than 1 drink per day for nonpregnant women and 2 drinks per day for men. One drink equals 12 oz of beer (355 mL), 5 oz of wine (148 mL), or 1 oz of hard liquor (44 mL).  Learn to manage stress. If you need help with this, ask your health care provider. Care for your body   Keep your immunizations up to date. In addition to getting vaccinations as told by your health care provider, it is recommended that you get vaccinated against the following illnesses: ? The flu (influenza). Get a flu shot  every year. ? Pneumonia. ? Hepatitis B.  Schedule an eye exam soon after your diagnosis, and then one time every year after that.  Check your skin and feet every day for cuts, bruises, redness, blisters, or sores. Schedule a foot exam with your health care provider once every year.  Brush your teeth and gums two times a day, and floss one or more times a day. Visit your dentist one or more times every 6 months.  Maintain a healthy weight. General instructions  Take over-the-counter and prescription medicines only as told by your health care provider.  Share your diabetes management plan with people in your workplace, school, and household.  Carry a medical alert card or wear medical  alert jewelry.  Keep all follow-up visits as told by your health care provider. This is important. Questions to ask your health care provider  Do I need to meet with a diabetes educator?  Where can I find a support group for people with diabetes? Where to find more information For more information about diabetes, visit:  American Diabetes Association (ADA): www.diabetes.org  American Association of Diabetes Educators (AADE): www.diabeteseducator.org Summary  Caring for yourself after you have been diagnosed with (type 2 diabetes mellitus) means keeping your blood sugar (glucose) under control with a balance of nutrition, exercise, lifestyle changes, and medicine.  Check your blood glucose every day, as often as told by your health care provider.  Having diabetes can put you at risk for other long-term (chronic) conditions, such as heart disease and kidney disease. Your health care provider may prescribe medicines to help prevent complications from diabetes.  Keep all follow-up visits as told by your health care provider. This is important. This information is not intended to replace advice given to you by your health care provider. Make sure you discuss any questions you have with your health care provider. Document Released: 11/16/2015 Document Revised: 01/15/2018 Document Reviewed: 08/28/2015 Elsevier Patient Education  2020 [employer].  Hypertension, Adult Hypertension is another name for high blood pressure. High blood pressure forces your heart to work harder to pump blood. This can cause problems over time. There are two numbers in a blood pressure reading. There is a top number (systolic) over a bottom number (diastolic). It is best to have a blood pressure that is below 120/80. Healthy choices can help lower your blood pressure, or you may need medicine to help lower it. What are the causes? The cause of this condition is not known. Some conditions may be related to high  blood pressure. What increases the risk?  Smoking.  Having type 2 diabetes mellitus, high cholesterol, or both.  Not getting enough exercise or physical activity.  Being overweight.  Having too much fat, sugar, calories, or salt (sodium) in your diet.  Drinking too much alcohol.  Having long-term (chronic) kidney disease.  Having a family history of high blood pressure.  Age. Risk increases with age.  Race. You may be at higher risk if you are African American.  Gender. Men are at higher risk than women before age 19. After age 69, women are at higher risk than men.  Having obstructive sleep apnea.  Stress. What are the signs or symptoms?  High blood pressure may not cause symptoms. Very high blood pressure (hypertensive crisis) may cause: ? Headache. ? Feelings of worry or nervousness (anxiety). ? Shortness of breath. ? Nosebleed. ? A feeling of being sick to your stomach (nausea). ? Throwing up (vomiting). ? Changes in how  you see. ? Very bad chest pain. ? Seizures. How is this treated?  This condition is treated by making healthy lifestyle changes, such as: ? Eating healthy foods. ? Exercising more. ? Drinking less alcohol.  Your health care provider may prescribe medicine if lifestyle changes are not enough to get your blood pressure under control, and if: ? Your top number is above 130. ? Your bottom number is above 80.  Your personal target blood pressure may vary. Follow these instructions at home: Eating and drinking   If told, follow the DASH eating plan. To follow this plan: ? Fill one half of your plate at each meal with fruits and vegetables. ? Fill one fourth of your plate at each meal with whole grains. Whole grains include whole-wheat pasta, brown rice, and whole-grain bread. ? Eat or drink low-fat dairy products, such as skim milk or low-fat yogurt. ? Fill one fourth of your plate at each meal with low-fat (lean) proteins. Low-fat proteins  include fish, chicken without skin, eggs, beans, and tofu. ? Avoid fatty meat, cured and processed meat, or chicken with skin. ? Avoid pre-made or processed food.  Eat less than 1,500 mg of salt each day.  Do not drink alcohol if: ? Your doctor tells you not to drink. ? You are pregnant, may be pregnant, or are planning to become pregnant.  If you drink alcohol: ? Limit how much you use to:  0-1 drink a day for women.  0-2 drinks a day for men. ? Be aware of how much alcohol is in your drink. In the [place] one drink equals one 12 oz bottle of beer (355 mL), one 5 oz glass of wine (148 mL), or one 1 oz glass of hard liquor (44 mL). Lifestyle   Work with your doctor to stay at a healthy weight or to lose weight. Ask your doctor what the best weight is for you.  Get at least 30 minutes of exercise most days of the week. This may include walking, swimming, or biking.  Get at least 30 minutes of exercise that strengthens your muscles (resistance exercise) at least 3 days a week. This may include lifting weights or doing Pilates.  Do not use any products that contain nicotine or tobacco, such as cigarettes, e-cigarettes, and chewing tobacco. If you need help quitting, ask your doctor.  Check your blood pressure at home as told by your doctor.  Keep all follow-up visits as told by your doctor. This is important. Medicines  Take over-the-counter and prescription medicines only as told by your doctor. Follow directions carefully.  Do not skip doses of blood pressure medicine. The medicine does not work as well if you skip doses. Skipping doses also puts you at risk for problems.  Ask your doctor about side effects or reactions to medicines that you should watch for. Contact a doctor if you:  Think you are having a reaction to the medicine you are taking.  Have headaches that keep coming back (recurring).  Feel dizzy.  Have swelling in your ankles.  Have trouble with your  vision. Get help right away if you:  Get a very bad headache.  Start to feel mixed up (confused).  Feel weak or numb.  Feel faint.  Have very bad pain in your: ? Chest. ? Belly (abdomen).  Throw up more than once.  Have trouble breathing. Summary  Hypertension is another name for high blood pressure.  High blood pressure forces your heart to work  harder to pump blood.  For most people, a normal blood pressure is less than 120/80.  Making healthy choices can help lower blood pressure. If your blood pressure does not get lower with healthy choices, you may need to take medicine. This information is not intended to replace advice given to you by your health care provider. Make sure you discuss any questions you have with your health care provider. Document Released: 01/11/2008 Document Revised: 04/04/2018 Document Reviewed: 04/04/2018 Elsevier Patient Education  2020 [employer].

## 2019-07-25 ENCOUNTER — Encounter: Payer: Self-pay | Admitting: Adult Health

## 2019-07-25 ENCOUNTER — Ambulatory Visit: Payer: Medicaid Other | Admitting: Adult Health

## 2019-07-25 VITALS — BP 140/90 | HR 72 | Temp 97.4°F | Ht 67.0 in | Wt 159.4 lb

## 2019-07-25 DIAGNOSIS — E785 Hyperlipidemia, unspecified: Secondary | ICD-10-CM

## 2019-07-25 DIAGNOSIS — G8111 Spastic hemiplegia affecting right dominant side: Secondary | ICD-10-CM | POA: Diagnosis not present

## 2019-07-25 DIAGNOSIS — I10 Essential (primary) hypertension: Secondary | ICD-10-CM | POA: Diagnosis not present

## 2019-07-25 DIAGNOSIS — I639 Cerebral infarction, unspecified: Secondary | ICD-10-CM | POA: Diagnosis not present

## 2019-07-25 DIAGNOSIS — I6930 Unspecified sequelae of cerebral infarction: Secondary | ICD-10-CM | POA: Diagnosis not present

## 2019-07-25 DIAGNOSIS — E119 Type 2 diabetes mellitus without complications: Secondary | ICD-10-CM

## 2019-07-25 DIAGNOSIS — I6381 Other cerebral infarction due to occlusion or stenosis of small artery: Secondary | ICD-10-CM

## 2019-07-25 LAB — COMPREHENSIVE METABOLIC PANEL WITH GFR
ALT: 29 IU/L (ref 0–44)
AST: 19 IU/L (ref 0–40)
Albumin/Globulin Ratio: 2 (ref 1.2–2.2)
Albumin: 4.7 g/dL (ref 3.8–4.9)
Alkaline Phosphatase: 105 IU/L (ref 39–117)
BUN/Creatinine Ratio: 9 (ref 9–20)
BUN: 8 mg/dL (ref 6–24)
Bilirubin Total: 0.6 mg/dL (ref 0.0–1.2)
CO2: 23 mmol/L (ref 20–29)
Calcium: 10.3 mg/dL — ABNORMAL HIGH (ref 8.7–10.2)
Chloride: 103 mmol/L (ref 96–106)
Creatinine, Ser: 0.94 mg/dL (ref 0.76–1.27)
GFR calc Af Amer: 104 mL/min/1.73
GFR calc non Af Amer: 90 mL/min/1.73
Globulin, Total: 2.4 g/dL (ref 1.5–4.5)
Glucose: 152 mg/dL — ABNORMAL HIGH (ref 65–99)
Potassium: 3.9 mmol/L (ref 3.5–5.2)
Sodium: 141 mmol/L (ref 134–144)
Total Protein: 7.1 g/dL (ref 6.0–8.5)

## 2019-07-25 NOTE — Patient Instructions (Addendum)
Recommend starting home health PT/OT -order will be placed and they will call to schedule visits  Continue to follow with Dr. Posey Pronto as scheduled with starting Botox injections as well as ongoing use of baclofen  Continue aspirin 81 mg daily  and atorvastatin for secondary stroke prevention  Continue to follow up with PCP regarding blood pressure, cholesterol and diabetes management   Continue to monitor blood pressure at home  Maintain strict control of hypertension with blood pressure goal below 130/90, diabetes with hemoglobin A1c goal below 6.5% and cholesterol with LDL cholesterol (bad cholesterol) goal below 70 mg/dL. I also advised the patient to eat a healthy diet with plenty of whole grains, cereals, fruits and vegetables, exercise regularly and maintain ideal body weight.  Follow-up in 6 months or call earlier if needed       Thank you for coming to see [place] at [facility]. I hope we have been able to provide you high quality care today.  You may receive a patient satisfaction survey over the next few weeks. We would appreciate your feedback and comments so that we may continue to improve ourselves and the health of our patients.

## 2019-07-25 NOTE — Progress Notes (Signed)
[facility] [address] [place]. [place] [address] [phone]       OFFICE FOLLOW UP NOTE  [name] Date of Birth:  Jan 05, 1961 Medical Record Number:  [id number]   Reason for Referral: stroke follow up    CHIEF COMPLAINT:  Chief Complaint  Patient presents with  . Follow-up    RM9, alone. No questions nor concerns. States he is doing well.    HPI: Stroke admission 02/28/2019: [name] is a 58 y.o. male with history of hypertension  who presented to [facility] ED on 02/28/2019 with right sided weakness and gait difficulties. He did not receive IV t-PA due to late presentation (>4.5 hours from time of onset).  Stroke work-up revealed left BG small infarct as evidenced on MRI likely secondary to small vessel disease source with resultant dysarthria and right hemiparesis.  TCD and carotid Doppler unremarkable.  2D echo normal EF.  Recommended DAPT for 3 weeks and aspirin alone. Hx of HTN with long-term BP goal normotensive range and discharged on home dose amlodipine and initiated lisinopril.  Initiated atorvastatin 80 mg daily for HLD management.  Current tobacco use with smoking cessation counseling provided.  Uncontrolled DM with A1c 7.9.  Other stroke risk factors include EtOH use but no prior history of stroke.  He was discharged to [facility] for ongoing therapy and discharged home on 03/20/2019.  Initial visit 05/01/2019: [name] is being seen today for hospital follow-up accompanied by his fiance.  Residual deficits of right hemiparesis and mild dysarthria with mild right facial droop.  He is not currently receiving any therapies as he is Medicaid pending.  He does continue to do exercises at home with ongoing improvement.  He does ambulate without assistive device but does use wheelchair for long distance.  Currently has AFO brace in place for right ankle weakness.  Completed 3 weeks DAPT and continues on aspirin alone without bleeding or bruising.   Continues on atorvastatin 80 mg daily without myalgias.  Blood pressure today 125/71.  Glucose levels have been stable with ongoing use of metformin.  Continues to follow with PCP for HTN, HLD and DM management.  Denies new or worsening stroke/TIA symptoms.  Update 07/25/2019: [name] is a 58 year old male who is being seen today for stroke follow-up.  Residual deficits of right spastic hemiparesis, mild right facial droop and dysarthria.  He does endorse ongoing improvement.  Continues to follow with physical medicine rehab Dr. Posey Pronto with plans on receiving Botox for ongoing spasticity.  He also had baclofen dosage increased to 20 mg 3 times daily.  He has not received any therapies as previously Medicaid pending.  He primarily uses wheelchair for transfers but has been walking very short distances with assistance of his wife.  Previously using AFO brace but not currently in place with patient stating "I want to try something different".  Continues on aspirin and atorvastatin for secondary stroke prevention without side effects.  Prior lipid panel on 05/08/2019 showed LDL 57.  Blood pressure today 140/90.  Blood work obtained yesterday by PCP Dr. Chapman Fitch with A1c 8.4.  Continues to follow with PCP actively for ongoing management.  Denies new or worsening stroke/TIA symptoms.      ROS:   14 system review of systems performed and negative with exception of speech difficulty, weakness and ambulation difficulty  PMH:  Past Medical History:  Diagnosis Date  . Hypertension     PSH:  Past Surgical History:  Procedure Laterality Date  .  NO PAST SURGERIES      Social History:  Social History   Socioeconomic History  . Marital status: Single    Spouse name: Not on file  . Number of children: Not on file  . Years of education: Not on file  . Highest education level: Not on file  Occupational History  . Not on file  Tobacco Use  . Smoking status: Current Every Day Smoker  . Smokeless  tobacco: Never Used  . Tobacco comment: Smoking 4 cigs/day  Substance and Sexual Activity  . Alcohol use: Yes    Alcohol/week: 2.0 standard drinks    Types: 2 Standard drinks or equivalent per week  . Drug use: No  . Sexual activity: Never  Other Topics Concern  . Not on file  Social History Narrative  . Not on file   Social Determinants of Health   Financial Resource Strain:   . Difficulty of Paying Living Expenses: Not on file  Food Insecurity:   . Worried About Charity fundraiser in the Last Year: Not on file  . Ran Out of Food in the Last Year: Not on file  Transportation Needs:   . Lack of Transportation (Medical): Not on file  . Lack of Transportation (Non-Medical): Not on file  Physical Activity:   . Days of Exercise per Week: Not on file  . Minutes of Exercise per Session: Not on file  Stress:   . Feeling of Stress : Not on file  Social Connections:   . Frequency of Communication with Friends and Family: Not on file  . Frequency of Social Gatherings with Friends and Family: Not on file  . Attends Religious Services: Not on file  . Active Member of Clubs or Organizations: Not on file  . Attends Archivist Meetings: Not on file  . Marital Status: Not on file  Intimate Partner Violence:   . Fear of Current or Ex-Partner: Not on file  . Emotionally Abused: Not on file  . Physically Abused: Not on file  . Sexually Abused: Not on file    Family History:  Family History  Problem Relation Age of Onset  . Heart attack Father   . Cancer Neg Hx     Medications:   Current Outpatient Medications on File Prior to Visit  Medication Sig Dispense Refill  . acetaminophen (TYLENOL) 325 MG tablet Take 1-2 tablets (325-650 mg total) by mouth every 4 (four) hours as needed for mild pain.    Marland Kitchen amLODipine (NORVASC) 10 MG tablet Take 1 tablet (10 mg total) by mouth daily. To lower blood pressure 90 tablet 3  . aspirin 81 MG EC tablet Take 1 tablet (81 mg total) by  mouth daily. 100 tablet 0  . atorvastatin (LIPITOR) 80 MG tablet One pill in the evenings to lower cholesterol 90 tablet 3  . baclofen (LIORESAL) 20 MG tablet Take 1 tablet (20 mg total) by mouth 3 (three) times daily. 90 each 1  . Blood Glucose Monitoring Suppl (TRUE METRIX METER) w/Device KIT Check blood sugar fasting and before meals and again if pt feels bad (symptoms of hypo). 1 kit 0  . carvedilol (COREG) 12.5 MG tablet Take 1 tablet (12.5 mg total) by mouth 2 (two) times daily with a meal. For blood pressure 180 tablet 3  . empagliflozin (JARDIANCE) 10 MG TABS tablet Take 10 mg by mouth daily before breakfast. 90 tablet 1  . famotidine (PEPCID) 20 MG tablet Take 1 tablet (20 mg total)  by mouth daily. 30 tablet 0  . glucose blood (TRUE METRIX BLOOD GLUCOSE TEST) test strip Use as instructed 100 each 12  . hydrALAZINE (APRESOLINE) 100 MG tablet Take 1 tablet (100 mg total) by mouth every 8 (eight) hours. To lower blood pressure 270 tablet 3  . nicotine (NICODERM CQ - DOSED IN MG/24 HOURS) 21 mg/24hr patch Place 1 patch (21 mg total) onto the skin daily. 28 patch 0  . TRUEplus Lancets 28G MISC Check blood sugar fasting and before meals and again if pt feels bad (symptoms of hypo). 100 each 12  . metFORMIN (GLUCOPHAGE) 500 MG tablet Take 1 tablet (500 mg total) by mouth 2 (two) times daily with a meal. (Patient not taking: Reported on 07/25/2019) 180 tablet 1   No current facility-administered medications on file prior to visit.    Allergies:   Allergies  Allergen Reactions  . Lisinopril Swelling    Angioedema- lips/face     Physical Exam  Vitals:   07/25/19 0919  BP: 140/90  Pulse: 72  Temp: (!) 97.4 F (36.3 C)  Weight: 159 lb 6.4 oz (72.3 kg)  Height: '5\' 7"'$  (1.702 m)   Body mass index is 24.97 kg/m. No exam data present  General: well developed, well nourished, pleasant middle-age African-American male, seated, in no evident distress Head: head normocephalic and  atraumatic.   Neck: supple with no carotid or supraclavicular bruits Cardiovascular: regular rate and rhythm, no murmurs Musculoskeletal: no deformity Skin:  no rash/petichiae Vascular:  Normal pulses all extremities   Neurologic Exam Mental Status: Awake and fully alert.   Mild dysarthria.  Oriented to place and time. Recent and remote memory intact. Attention span, concentration and fund of knowledge appropriate. Mood and affect appropriate.  Cranial Nerves: Fundoscopic exam reveals sharp disc margins. Pupils equal, briskly reactive to light. Extraocular movements full without nystagmus. Visual fields full to confrontation. Hearing intact. Facial sensation intact.  Mild right lower facial weakness. Motor: RUE: 3+/5 with weak grip strength and spasticity RLE: 3+-4/5 hip flexor and knee flexion and 1/5 ankle dorsiflexion Full strength left upper and lower extremity Sensory.: intact to touch , pinprick , position and vibratory sensation.  Coordination: Rapid alternating movements normal on left side. Finger-to-nose and heel-to-shin performed accurately on left side. Gait and Station: Gait assessment deferred Reflexes: 1+ and symmetric. Toes downgoing.     Diagnostic Data (Labs, Imaging, Testing)  [name] Contrast 02/28/2019 IMPRESSION:  1. Technically limited exam due to the patient's inability to tolerate the full length of the study as well as motion artifact.  2. 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  3. Additional possible punctate acute to early subacute ischemic infarct involving the posterior limb of the contralateral right internal capsule.   [name] Contrast 02/28/2019 IMPRESSION:  1. Mild degenerative disc bulging with facet hypertrophy at L5-S1 with resultant moderate bilateral L5 foraminal stenosis. Either of the exiting L5 nerve roots could be affected.  2. Disc bulging with moderate facet hypertrophy at  L4-5 with resultant mild bilateral L4 foraminal stenosis. No frank impingement.  3. Mild-to-moderate multilevel facet hypertrophy throughout the lumbar spine, most pronounced at L4-5 where there is associated reactive marrow edema. Finding could contribute to underlying low back pain.   Vas US Carotid (at [place] Only) 03/01/2019 Summary:  Right Carotid: Velocities in the right ICA are consistent with a 1-39% stenosis.  Left Carotid: Velocities in the left ICA  are consistent with a 1-39% stenosis.  Vertebrals: Bilateral vertebral arteries demonstrate antegrade flow.  Transthoracic Echocardiogram  1. The left ventricle has hyperdynamic systolic function, with an ejection fraction of >65%. The cavity size was normal. There is mild concentric left ventricular hypertrophy. Left ventricular diastolic Doppler parameters are consistent with impaired  relaxation. 2. The right ventricle has normal systolic function. The cavity was normal. There is no increase in right ventricular wall thickness. Right ventricular systolic pressure is normal. 3. The aortic valve is tricuspid. No stenosis of the aortic valve. 4. The aorta is normal in size and structure.   EKG - SR rate 88 BPM. (See cardiology reading for complete details)     ASSESSMENT: Casey Petty is a 58 y.o. year old male presented with right-sided weakness and gait difficulties on 02/28/2019 with stroke work-up revealing left BG small infarct secondary to small vessel disease. Vascular risk factors include HTN, HLD and tobacco use.  Residual deficits of right spastic hemiparesis, mild right facial droop and mild dysarthria    PLAN:  1. Left BG infarct: Continue aspirin 81 mg daily  and atorvastatin for secondary stroke prevention. Maintain strict control of hypertension with blood pressure goal below 130/90, diabetes with hemoglobin A1c goal below 6.5% and cholesterol with LDL cholesterol (bad cholesterol) goal below 70 mg/dL.  I  also advised the patient to eat a healthy diet with plenty of whole grains, cereals, fruits and vegetables, exercise regularly with at least 30 minutes of continuous activity daily and maintain ideal body weight. 2. HTN: Advised to continue current treatment regimen.  Today's BP stable.  Advised to continue to monitor at home along with continued follow-up with PCP for management 3. HLD: Advised to continue current treatment regimen along with continued follow-up with PCP for future prescribing and monitoring of lipid panel.  Recommend lipid panel at follow-up visit  4. DM: Continue to monitor glucose levels at home and ongoing follow-up with PCP 5. Right spastic hemiparesis and dysarthria: Referral placed for home health PT/OT to assist with ongoing improvement.  He was advised to use AFO brace with ambulation due to increased fall risk with dorsiflexion weakness.  He will continue to follow with physical medicine and rehab Dr. Posey Pronto to initiate Botox injections and ongoing use of baclofen 20 mg 3 times daily.  He declined speech therapy as his main goal is to improve strength and ambulation.    Follow up in 6 months or call earlier if needed   Greater than 50% of time during this 25 minute visit was spent on counseling, explanation of diagnosis of left BG infarct, reviewing risk factor management of HTN, HLD and tobacco use, planning of further management along with potential future management, and discussion with patient answered all questions to satisfaction along with updating fianc who was waiting in office lobby    [name], [facility]  [facility] [address] [place], [place] [address]  Phone [phone] Fax [phone] Note: This document was prepared with digital dictation and possible smart phrase technology. Any transcriptional errors that result from this process are unintentional.

## 2019-07-25 NOTE — Progress Notes (Signed)
I agree with the above plan 

## 2019-07-26 DIAGNOSIS — I6930 Unspecified sequelae of cerebral infarction: Secondary | ICD-10-CM | POA: Diagnosis not present

## 2019-07-27 DIAGNOSIS — I6930 Unspecified sequelae of cerebral infarction: Secondary | ICD-10-CM | POA: Diagnosis not present

## 2019-07-29 DIAGNOSIS — I6930 Unspecified sequelae of cerebral infarction: Secondary | ICD-10-CM | POA: Diagnosis not present

## 2019-07-30 ENCOUNTER — Telehealth: Payer: Self-pay | Admitting: *Deleted

## 2019-07-30 DIAGNOSIS — I6930 Unspecified sequelae of cerebral infarction: Secondary | ICD-10-CM | POA: Diagnosis not present

## 2019-07-30 DIAGNOSIS — G8111 Spastic hemiplegia affecting right dominant side: Secondary | ICD-10-CM

## 2019-07-30 NOTE — Telephone Encounter (Signed)
Called [facility] to confirm referral fax for home health. Fax is [phone]. Referral sent. Received a receipt of confirmation.

## 2019-07-30 NOTE — Telephone Encounter (Signed)
Rance Muir from [facility] called stating that they do things by zip code and his zip code is full. They will try again to fulfill the referral tomorrow but as of now it is a no, they are not able to take care of this for the pt. Please advise. [phone]

## 2019-07-31 DIAGNOSIS — I6930 Unspecified sequelae of cerebral infarction: Secondary | ICD-10-CM | POA: Diagnosis not present

## 2019-07-31 NOTE — Addendum Note (Signed)
Addended by: Minna Antis on: 07/31/2019 03:36 PM   Modules accepted: Orders

## 2019-07-31 NOTE — Telephone Encounter (Signed)
Casey Petty from [facility] called and stated that they can staff him for PT on next week only not the OT , if agreeable please rewrite order for PT only start of care next week

## 2019-07-31 NOTE — Telephone Encounter (Addendum)
Called Lori with [facility]  and advised the NP is out of office. Will send her request to work in [place] and let her know. She verbalized understanding, appreciation.

## 2019-07-31 NOTE — Telephone Encounter (Signed)
Agree for PT. -VRP

## 2019-08-01 DIAGNOSIS — I6930 Unspecified sequelae of cerebral infarction: Secondary | ICD-10-CM | POA: Diagnosis not present

## 2019-08-02 DIAGNOSIS — I6930 Unspecified sequelae of cerebral infarction: Secondary | ICD-10-CM | POA: Diagnosis not present

## 2019-08-03 DIAGNOSIS — I6930 Unspecified sequelae of cerebral infarction: Secondary | ICD-10-CM | POA: Diagnosis not present

## 2019-08-05 DIAGNOSIS — I6930 Unspecified sequelae of cerebral infarction: Secondary | ICD-10-CM | POA: Diagnosis not present

## 2019-08-05 NOTE — Telephone Encounter (Signed)
Noted referral has been sent to [facility] [phone]

## 2019-08-06 ENCOUNTER — Encounter: Payer: Self-pay | Admitting: Physical Medicine & Rehabilitation

## 2019-08-06 ENCOUNTER — Other Ambulatory Visit: Payer: Self-pay

## 2019-08-06 ENCOUNTER — Encounter: Payer: Medicaid Other | Admitting: Physical Medicine & Rehabilitation

## 2019-08-06 ENCOUNTER — Encounter: Payer: Medicaid Other | Attending: Registered Nurse | Admitting: Physical Medicine & Rehabilitation

## 2019-08-06 VITALS — BP 133/66 | HR 70 | Temp 97.5°F

## 2019-08-06 DIAGNOSIS — G8191 Hemiplegia, unspecified affecting right dominant side: Secondary | ICD-10-CM | POA: Insufficient documentation

## 2019-08-06 DIAGNOSIS — G8111 Spastic hemiplegia affecting right dominant side: Secondary | ICD-10-CM

## 2019-08-06 DIAGNOSIS — E1165 Type 2 diabetes mellitus with hyperglycemia: Secondary | ICD-10-CM | POA: Insufficient documentation

## 2019-08-06 DIAGNOSIS — I10 Essential (primary) hypertension: Secondary | ICD-10-CM | POA: Insufficient documentation

## 2019-08-06 DIAGNOSIS — I6930 Unspecified sequelae of cerebral infarction: Secondary | ICD-10-CM | POA: Diagnosis not present

## 2019-08-06 DIAGNOSIS — I639 Cerebral infarction, unspecified: Secondary | ICD-10-CM | POA: Insufficient documentation

## 2019-08-06 NOTE — Progress Notes (Signed)
Botox: Procedure Note Patient Name: Casey Petty DOB: [birth year] MRN: [id number]  Date: 08/06/2019   Procedure: Botulinum toxin administration Guidance: EMG Diagnosis: Spastic Hemiparesis Attending: Delice Lesch, MD   Trade name: Botox (onabotulinumtoxinA)  Informed consent: Risks, benefits & options of the procedure are explained to the patient (and/or family). The patient elects to proceed with procedure. Risks include but are not limited to weakness, respiratory distress, dry mouth, ptosis, antibody formation, worsening of some areas of function. Benefits include decreased abnormal muscle tone, improved hygiene and positioning, decreased skin breakdown and, in some cases, decreased pain. Options include conservative management with oral antispasticity agents, phenol chemodenervation of nerve or at motor nerve branches. More invasive options include intrathecal balcofen adminstration for appropriate candidates. Surgical options may include tendon lengthening or transposition or, rarely, dorsal rhizotomy.   History/Physical Examination: 58 y.o. male with history of HTN--no medicine 3 years presents for follow up for left basal ganglia stroke.  mAS  Shoulder abductions 2/4   Elbow flexors 3/4   Wrist flextors 2/4  Previous Treatments: Therapy/Range of motion Indication for guidance: Target active muscules  Procedure: Botulinum toxin was mixed with preservative free saline with a dilution of 1cc to 100 units. Targeted limb and muscles were identified. The skin was prepped with alcohol swabs and placement of needle tip in targeted muscle was confirmed using appropriate guidance. Prior to injection, positioning of needle tip outside of blood vessel was determined by pulling back on syringe plunger.  MUSCLE UNITS  Right Pec Major 25 units Right biceps med: 50 units Right FCR: 12.5 units Right FCU 12.5 units  Total units used: 123XX123 units  Complications: None. Patient jumped during  procedure, however, needle was maintained with good EMG feedback prior to continuing with medication administration. Plan: Recommended follow with therapies, if limited visits hold for 6 weeks  Lexey Fletes Lorie Phenix 8:46 AM

## 2019-08-07 DIAGNOSIS — I6930 Unspecified sequelae of cerebral infarction: Secondary | ICD-10-CM | POA: Diagnosis not present

## 2019-08-08 DIAGNOSIS — I6930 Unspecified sequelae of cerebral infarction: Secondary | ICD-10-CM | POA: Diagnosis not present

## 2019-08-09 DIAGNOSIS — I6930 Unspecified sequelae of cerebral infarction: Secondary | ICD-10-CM | POA: Diagnosis not present

## 2019-08-09 DIAGNOSIS — I639 Cerebral infarction, unspecified: Secondary | ICD-10-CM | POA: Diagnosis not present

## 2019-08-10 DIAGNOSIS — I6930 Unspecified sequelae of cerebral infarction: Secondary | ICD-10-CM | POA: Diagnosis not present

## 2019-08-12 DIAGNOSIS — I6930 Unspecified sequelae of cerebral infarction: Secondary | ICD-10-CM | POA: Diagnosis not present

## 2019-08-13 DIAGNOSIS — I6930 Unspecified sequelae of cerebral infarction: Secondary | ICD-10-CM | POA: Diagnosis not present

## 2019-08-14 DIAGNOSIS — I6930 Unspecified sequelae of cerebral infarction: Secondary | ICD-10-CM | POA: Diagnosis not present

## 2019-08-15 DIAGNOSIS — I6930 Unspecified sequelae of cerebral infarction: Secondary | ICD-10-CM | POA: Diagnosis not present

## 2019-08-16 DIAGNOSIS — I6930 Unspecified sequelae of cerebral infarction: Secondary | ICD-10-CM | POA: Diagnosis not present

## 2019-08-17 DIAGNOSIS — I6930 Unspecified sequelae of cerebral infarction: Secondary | ICD-10-CM | POA: Diagnosis not present

## 2019-08-19 DIAGNOSIS — I6930 Unspecified sequelae of cerebral infarction: Secondary | ICD-10-CM | POA: Diagnosis not present

## 2019-08-20 ENCOUNTER — Telehealth: Payer: Self-pay

## 2019-08-20 DIAGNOSIS — I6930 Unspecified sequelae of cerebral infarction: Secondary | ICD-10-CM | POA: Diagnosis not present

## 2019-08-20 NOTE — Telephone Encounter (Signed)
Met with the patient's fiance, Casey Petty when she was in the clinic today.  She completed a SCAT application for the patient.  Call placed to patient and he gave verbal authorization for medical information to be released to SCAT. He also approved of Ms Theda Sers completing the application for him.   Completed SCAT application faxed to SCAT eligibility

## 2019-08-21 DIAGNOSIS — I6930 Unspecified sequelae of cerebral infarction: Secondary | ICD-10-CM | POA: Diagnosis not present

## 2019-08-22 ENCOUNTER — Telehealth: Payer: Self-pay | Admitting: Family Medicine

## 2019-08-22 DIAGNOSIS — I6930 Unspecified sequelae of cerebral infarction: Secondary | ICD-10-CM | POA: Diagnosis not present

## 2019-08-22 NOTE — Telephone Encounter (Signed)
Occupational therapist Erlene Quan needs verbal orders for OT -1W1 -2W4 -3W4 Please follow up [phone]

## 2019-08-23 ENCOUNTER — Telehealth: Payer: Self-pay | Admitting: *Deleted

## 2019-08-23 DIAGNOSIS — I6930 Unspecified sequelae of cerebral infarction: Secondary | ICD-10-CM | POA: Diagnosis not present

## 2019-08-23 NOTE — Telephone Encounter (Signed)
Erlene Quan, [facility] left a message asking for verbal orders for HHOT 2week6.  He is also asking for an explanation why the patient told him they have to wait 6 weeks to start. I contacted the Erlene Quan [facility] and explained that Dr. Ena Dawley last clinic note states, "Plan: Recommended follow with therapies, if limited visits hold for 6 weeks". Erlene Quan, [facility] verbalized understanding.  Verbal orders given

## 2019-08-23 NOTE — Telephone Encounter (Signed)
Verbal orders given to [place], [place]

## 2019-08-23 NOTE — Telephone Encounter (Signed)
Please give verbal consent for the OT orders

## 2019-08-24 DIAGNOSIS — I6930 Unspecified sequelae of cerebral infarction: Secondary | ICD-10-CM | POA: Diagnosis not present

## 2019-08-26 DIAGNOSIS — I6930 Unspecified sequelae of cerebral infarction: Secondary | ICD-10-CM | POA: Diagnosis not present

## 2019-08-27 DIAGNOSIS — I6930 Unspecified sequelae of cerebral infarction: Secondary | ICD-10-CM | POA: Diagnosis not present

## 2019-08-28 DIAGNOSIS — I6930 Unspecified sequelae of cerebral infarction: Secondary | ICD-10-CM | POA: Diagnosis not present

## 2019-08-29 DIAGNOSIS — Z0271 Encounter for disability determination: Secondary | ICD-10-CM

## 2019-08-29 DIAGNOSIS — I6930 Unspecified sequelae of cerebral infarction: Secondary | ICD-10-CM | POA: Diagnosis not present

## 2019-08-30 DIAGNOSIS — I6930 Unspecified sequelae of cerebral infarction: Secondary | ICD-10-CM | POA: Diagnosis not present

## 2019-08-31 DIAGNOSIS — I6930 Unspecified sequelae of cerebral infarction: Secondary | ICD-10-CM | POA: Diagnosis not present

## 2019-09-02 DIAGNOSIS — I6930 Unspecified sequelae of cerebral infarction: Secondary | ICD-10-CM | POA: Diagnosis not present

## 2019-09-03 DIAGNOSIS — I6930 Unspecified sequelae of cerebral infarction: Secondary | ICD-10-CM | POA: Diagnosis not present

## 2019-09-04 DIAGNOSIS — I6930 Unspecified sequelae of cerebral infarction: Secondary | ICD-10-CM | POA: Diagnosis not present

## 2019-09-05 DIAGNOSIS — I6930 Unspecified sequelae of cerebral infarction: Secondary | ICD-10-CM | POA: Diagnosis not present

## 2019-09-06 DIAGNOSIS — I6930 Unspecified sequelae of cerebral infarction: Secondary | ICD-10-CM | POA: Diagnosis not present

## 2019-09-07 DIAGNOSIS — I6930 Unspecified sequelae of cerebral infarction: Secondary | ICD-10-CM | POA: Diagnosis not present

## 2019-09-09 DIAGNOSIS — I639 Cerebral infarction, unspecified: Secondary | ICD-10-CM | POA: Diagnosis not present

## 2019-09-09 DIAGNOSIS — I6930 Unspecified sequelae of cerebral infarction: Secondary | ICD-10-CM | POA: Diagnosis not present

## 2019-09-11 DIAGNOSIS — I6930 Unspecified sequelae of cerebral infarction: Secondary | ICD-10-CM | POA: Diagnosis not present

## 2019-09-12 DIAGNOSIS — I6930 Unspecified sequelae of cerebral infarction: Secondary | ICD-10-CM | POA: Diagnosis not present

## 2019-09-13 DIAGNOSIS — I6930 Unspecified sequelae of cerebral infarction: Secondary | ICD-10-CM | POA: Diagnosis not present

## 2019-09-14 DIAGNOSIS — I6930 Unspecified sequelae of cerebral infarction: Secondary | ICD-10-CM | POA: Diagnosis not present

## 2019-09-16 DIAGNOSIS — I6930 Unspecified sequelae of cerebral infarction: Secondary | ICD-10-CM | POA: Diagnosis not present

## 2019-09-16 MED FILL — BACLOFEN 20 MG TABLET: 20 | 30 days supply | Qty: 90 | Fill #1

## 2019-09-17 ENCOUNTER — Telehealth: Payer: Self-pay

## 2019-09-17 ENCOUNTER — Other Ambulatory Visit: Payer: Self-pay

## 2019-09-17 ENCOUNTER — Encounter: Payer: Self-pay | Admitting: Physical Medicine & Rehabilitation

## 2019-09-17 ENCOUNTER — Encounter: Payer: Medicaid Other | Attending: Registered Nurse | Admitting: Physical Medicine & Rehabilitation

## 2019-09-17 VITALS — BP 135/80 | HR 75 | Temp 97.7°F | Ht 67.0 in | Wt 160.0 lb

## 2019-09-17 DIAGNOSIS — G8110 Spastic hemiplegia affecting unspecified side: Secondary | ICD-10-CM

## 2019-09-17 DIAGNOSIS — G8191 Hemiplegia, unspecified affecting right dominant side: Secondary | ICD-10-CM | POA: Diagnosis not present

## 2019-09-17 DIAGNOSIS — I10 Essential (primary) hypertension: Secondary | ICD-10-CM | POA: Insufficient documentation

## 2019-09-17 DIAGNOSIS — E1165 Type 2 diabetes mellitus with hyperglycemia: Secondary | ICD-10-CM | POA: Diagnosis not present

## 2019-09-17 DIAGNOSIS — R269 Unspecified abnormalities of gait and mobility: Secondary | ICD-10-CM | POA: Diagnosis not present

## 2019-09-17 DIAGNOSIS — G8111 Spastic hemiplegia affecting right dominant side: Secondary | ICD-10-CM

## 2019-09-17 DIAGNOSIS — I639 Cerebral infarction, unspecified: Secondary | ICD-10-CM | POA: Diagnosis not present

## 2019-09-17 DIAGNOSIS — Z720 Tobacco use: Secondary | ICD-10-CM | POA: Diagnosis not present

## 2019-09-17 DIAGNOSIS — M7501 Adhesive capsulitis of right shoulder: Secondary | ICD-10-CM | POA: Diagnosis not present

## 2019-09-17 DIAGNOSIS — I6930 Unspecified sequelae of cerebral infarction: Secondary | ICD-10-CM | POA: Diagnosis not present

## 2019-09-17 MED ORDER — BACLOFEN 20 MG PO TABS: 20.0000 mg | ORAL_TABLET | Freq: Three times a day (TID) | ORAL | 1 refills | Status: AC

## 2019-09-17 NOTE — Progress Notes (Signed)
Subjective:    Patient ID: Casey Petty, male    DOB: October 11, 1960, 59 y.o.   MRN: [id number]  HPI Male with history of HTN--no medicine 3 years presents for follow up for left basal ganglia stroke.  Last clinic visit on 08/06/2019.  He had botulinum toxin injection at that time.  Since that time, patient states he tolerated the injections. He is taking the Baclofen, with some benefit.  He states he is doing HEP.  BP is relatively controlled.  Denies falls. He is still smoking 2 cig/day.  Pain Inventory Average Pain 0 Pain Right Now 0 My pain is na  In the last 24 hours, has pain interfered with the following? General activity 0 Relation with others 0 Enjoyment of life 0 What TIME of day is your pain at its worst? na Sleep (in general) Fair  Pain is worse with: na Pain improves with: na Relief from Meds: na  Mobility ability to climb steps?  no do you drive?  no Do you have any goals in this area?  yes  Function Do you have any goals in this area?  no  Neuro/Psych No problems in this area  Prior Studies Any changes since last visit?  no  Physicians involved in your care Any changes since last visit?  no   Family History  Problem Relation Age of Onset  . Heart attack Father   . Cancer Neg Hx    Social History   Socioeconomic History  . Marital status: Single    Spouse name: Not on file  . Number of children: Not on file  . Years of education: Not on file  . Highest education level: Not on file  Occupational History  . Not on file  Tobacco Use  . Smoking status: Current Every Day Smoker  . Smokeless tobacco: Never Used  . Tobacco comment: Smoking 4 cigs/day  Substance and Sexual Activity  . Alcohol use: Yes    Alcohol/week: 2.0 standard drinks    Types: 2 Standard drinks or equivalent per week  . Drug use: No  . Sexual activity: Never  Other Topics Concern  . Not on file  Social History Narrative  . Not on file   Social Determinants of Health    Financial Resource Strain:   . Difficulty of Paying Living Expenses: Not on file  Food Insecurity:   . Worried About Charity fundraiser in the Last Year: Not on file  . Ran Out of Food in the Last Year: Not on file  Transportation Needs:   . Lack of Transportation (Medical): Not on file  . Lack of Transportation (Non-Medical): Not on file  Physical Activity:   . Days of Exercise per Week: Not on file  . Minutes of Exercise per Session: Not on file  Stress:   . Feeling of Stress : Not on file  Social Connections:   . Frequency of Communication with Friends and Family: Not on file  . Frequency of Social Gatherings with Friends and Family: Not on file  . Attends Religious Services: Not on file  . Active Member of Clubs or Organizations: Not on file  . Attends Archivist Meetings: Not on file  . Marital Status: Not on file   Past Surgical History:  Procedure Laterality Date  . NO PAST SURGERIES     Past Medical History:  Diagnosis Date  . Hypertension    BP 135/80   Pulse 75   Temp 97.7 F (36.5  C)   Ht 5\' 7"  (1.702 m)   Wt 160 lb (72.6 kg)   SpO2 96%   BMI 25.06 kg/m   Opioid Risk Score:   Fall Risk Score:  `1  Depression screen PHQ 2/9  Depression screen [facility] 2/9 06/20/2019 05/01/2019 03/27/2019 06/27/2014  Decreased Interest 0 0 0 0  Down, Depressed, Hopeless 0 0 0 0  PHQ - 2 Score 0 0 0 0   Review of Systems  Constitutional: Negative.   HENT: Negative.   Eyes: Negative.   Respiratory: Negative.   Cardiovascular: Negative.   Gastrointestinal: Negative.   Endocrine: Negative.   Genitourinary: Negative.   Musculoskeletal: Positive for gait problem.  Skin: Negative.   Allergic/Immunologic: Negative.   Neurological: Positive for weakness.  Hematological: Negative.   Psychiatric/Behavioral: Negative.       Objective:   Physical Exam  Constitutional: No distress . Respiratory: Normal effort.   Psych: Normal mood.  Normal behavior. Musc:   Right shoulder with pain with ER Neurologic: Alert Gait: spastic hemiplegic Dysarthria, unchanged Motor:  RUE: Shoulder abduction 3-/5 (pain inhibition), elbow flexion 1/5, elbow /extension 3+/5, handgrip 3/5 with apraxia Right lower extremity: Hip flexion, knee extension 4/5, ankle dorsiflexion 0/5 MAS: right elbow flexors 2/4, wrist flexors 2/4, finger flexors 1+/4    Assessment & Plan:  Male with history of HTN--no medicine 3 years presents for follow up for left basal ganglia stroke.  1. 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   Cont HEP  Will increase Baclofen 30 TID  Follow up with Neurology, needs appointment, reminded again x3  Botulinum toxin injection:   Right  Mec Major: 25 units    Biceps: 100    FCR  25    FCU  25  2. HTN:   Relatively controlled  Cont follow up with PCP  3. Gait abnormality  Cont HEP  Cont ambulation with AFO  4. Tobacco abuse  2/day, encouraged abstinence x3  5. Adhesive capsulitis  Will schedule for injecition

## 2019-09-17 NOTE — Telephone Encounter (Signed)
Orders for home OT faxed to [facility]

## 2019-09-17 NOTE — Telephone Encounter (Signed)
Singed orders for PT faxed to [place]

## 2019-09-18 DIAGNOSIS — I6930 Unspecified sequelae of cerebral infarction: Secondary | ICD-10-CM | POA: Diagnosis not present

## 2019-09-19 DIAGNOSIS — I6930 Unspecified sequelae of cerebral infarction: Secondary | ICD-10-CM | POA: Diagnosis not present

## 2019-09-20 DIAGNOSIS — I6930 Unspecified sequelae of cerebral infarction: Secondary | ICD-10-CM | POA: Diagnosis not present

## 2019-09-21 DIAGNOSIS — I6930 Unspecified sequelae of cerebral infarction: Secondary | ICD-10-CM | POA: Diagnosis not present

## 2019-09-23 DIAGNOSIS — I6930 Unspecified sequelae of cerebral infarction: Secondary | ICD-10-CM | POA: Diagnosis not present

## 2019-09-24 DIAGNOSIS — I6930 Unspecified sequelae of cerebral infarction: Secondary | ICD-10-CM | POA: Diagnosis not present

## 2019-09-25 ENCOUNTER — Telehealth: Payer: Self-pay

## 2019-09-25 DIAGNOSIS — I6381 Other cerebral infarction due to occlusion or stenosis of small artery: Secondary | ICD-10-CM

## 2019-09-25 DIAGNOSIS — R269 Unspecified abnormalities of gait and mobility: Secondary | ICD-10-CM

## 2019-09-25 DIAGNOSIS — I639 Cerebral infarction, unspecified: Secondary | ICD-10-CM

## 2019-09-25 DIAGNOSIS — I6930 Unspecified sequelae of cerebral infarction: Secondary | ICD-10-CM | POA: Diagnosis not present

## 2019-09-25 DIAGNOSIS — G8111 Spastic hemiplegia affecting right dominant side: Secondary | ICD-10-CM

## 2019-09-25 NOTE — Telephone Encounter (Signed)
We can make referral for outpatient therapies.  Thanks.

## 2019-09-25 NOTE — Addendum Note (Signed)
Addended by: Jasmine December T on: 09/25/2019 01:21 PM   Modules accepted: Orders

## 2019-09-25 NOTE — Telephone Encounter (Signed)
Erlene Quan from [facility] called patient is ready for [facility] and [facility] discharge. Recommend Outpt therapy, need referral sent.

## 2019-09-26 DIAGNOSIS — I6930 Unspecified sequelae of cerebral infarction: Secondary | ICD-10-CM | POA: Diagnosis not present

## 2019-09-27 DIAGNOSIS — I6930 Unspecified sequelae of cerebral infarction: Secondary | ICD-10-CM | POA: Diagnosis not present

## 2019-09-28 DIAGNOSIS — I6930 Unspecified sequelae of cerebral infarction: Secondary | ICD-10-CM | POA: Diagnosis not present

## 2019-09-30 DIAGNOSIS — I6930 Unspecified sequelae of cerebral infarction: Secondary | ICD-10-CM | POA: Diagnosis not present

## 2019-10-01 DIAGNOSIS — I6930 Unspecified sequelae of cerebral infarction: Secondary | ICD-10-CM | POA: Diagnosis not present

## 2019-10-02 DIAGNOSIS — I6930 Unspecified sequelae of cerebral infarction: Secondary | ICD-10-CM | POA: Diagnosis not present

## 2019-10-03 DIAGNOSIS — I6930 Unspecified sequelae of cerebral infarction: Secondary | ICD-10-CM | POA: Diagnosis not present

## 2019-10-04 DIAGNOSIS — I6930 Unspecified sequelae of cerebral infarction: Secondary | ICD-10-CM | POA: Diagnosis not present

## 2019-10-05 DIAGNOSIS — I6930 Unspecified sequelae of cerebral infarction: Secondary | ICD-10-CM | POA: Diagnosis not present

## 2019-10-07 ENCOUNTER — Encounter: Payer: Self-pay | Admitting: Physical Medicine & Rehabilitation

## 2019-10-07 ENCOUNTER — Encounter: Payer: Medicaid Other | Attending: Registered Nurse | Admitting: Physical Medicine & Rehabilitation

## 2019-10-07 ENCOUNTER — Other Ambulatory Visit: Payer: Self-pay

## 2019-10-07 VITALS — BP 163/90 | HR 83 | Temp 97.7°F | Ht 67.0 in | Wt 166.0 lb

## 2019-10-07 DIAGNOSIS — G8191 Hemiplegia, unspecified affecting right dominant side: Secondary | ICD-10-CM | POA: Insufficient documentation

## 2019-10-07 DIAGNOSIS — M7501 Adhesive capsulitis of right shoulder: Secondary | ICD-10-CM

## 2019-10-07 DIAGNOSIS — E1165 Type 2 diabetes mellitus with hyperglycemia: Secondary | ICD-10-CM | POA: Insufficient documentation

## 2019-10-07 DIAGNOSIS — I6930 Unspecified sequelae of cerebral infarction: Secondary | ICD-10-CM | POA: Diagnosis not present

## 2019-10-07 DIAGNOSIS — I639 Cerebral infarction, unspecified: Secondary | ICD-10-CM | POA: Insufficient documentation

## 2019-10-07 DIAGNOSIS — I10 Essential (primary) hypertension: Secondary | ICD-10-CM | POA: Insufficient documentation

## 2019-10-07 NOTE — Progress Notes (Addendum)
Ultrasound guided intraarticular shoulder injection: Right glenohumeral  Indication:Shoulder pain not relieved by medication management and other conservative care.  Informed consent was obtained after describing risks and benefits of the procedure with the patient, this includes bleeding, bruising, infection and medication side effects. The patient wishes to proceed and has given written consent. Patient was placed in a seated position with the ipsilateral hand placed on the contralateral shoulder. The right shoulder was marked and prepped with betadine posterior and inferior to the acromium. An ultrasound tranducer was placed inferior and parallel to the scapular spine.  The glenohumeral joint was visualized.  Vapocoolant spray was applied.  A 22-gauge 2 inch needle was inserted into the subacromial area under with visualization until the needle was in the joint space. After negative draw back for blood, a solution containing 1 mL of 6 mg per ML betamethasone and 4 mL of 1% lidocaine was injected. A band aid was applied. The patient with repeated giggling and movement throughout the procedure. Tolerated the procedure well. Post procedure instructions were given.

## 2019-10-08 DIAGNOSIS — I6930 Unspecified sequelae of cerebral infarction: Secondary | ICD-10-CM | POA: Diagnosis not present

## 2019-10-09 DIAGNOSIS — I6930 Unspecified sequelae of cerebral infarction: Secondary | ICD-10-CM | POA: Diagnosis not present

## 2019-10-10 DIAGNOSIS — I6930 Unspecified sequelae of cerebral infarction: Secondary | ICD-10-CM | POA: Diagnosis not present

## 2019-10-11 DIAGNOSIS — I6930 Unspecified sequelae of cerebral infarction: Secondary | ICD-10-CM | POA: Diagnosis not present

## 2019-10-12 DIAGNOSIS — I6930 Unspecified sequelae of cerebral infarction: Secondary | ICD-10-CM | POA: Diagnosis not present

## 2019-10-14 DIAGNOSIS — I6930 Unspecified sequelae of cerebral infarction: Secondary | ICD-10-CM | POA: Diagnosis not present

## 2019-10-15 DIAGNOSIS — I6930 Unspecified sequelae of cerebral infarction: Secondary | ICD-10-CM | POA: Diagnosis not present

## 2019-10-15 MED FILL — CARVEDILOL 12.5 MG TABLET: 12.5 | 90 days supply | Qty: 180 | Fill #1

## 2019-10-15 MED FILL — hydrALAZINE HCL 100 MG TABS: 100 | 90 days supply | Qty: 270 | Fill #1

## 2019-10-15 MED FILL — ATORVASTATIN 80 MG TABLET: 80 | 90 days supply | Qty: 90 | Fill #1

## 2019-10-15 MED FILL — AMLODIPINE BESYLATE 10 MG T: 10 | 90 days supply | Qty: 90 | Fill #1

## 2019-10-16 DIAGNOSIS — I6930 Unspecified sequelae of cerebral infarction: Secondary | ICD-10-CM | POA: Diagnosis not present

## 2019-10-17 DIAGNOSIS — I6930 Unspecified sequelae of cerebral infarction: Secondary | ICD-10-CM | POA: Diagnosis not present

## 2019-10-18 DIAGNOSIS — I6930 Unspecified sequelae of cerebral infarction: Secondary | ICD-10-CM | POA: Diagnosis not present

## 2019-10-19 DIAGNOSIS — I6930 Unspecified sequelae of cerebral infarction: Secondary | ICD-10-CM | POA: Diagnosis not present

## 2019-10-21 ENCOUNTER — Encounter: Payer: Medicaid Other | Admitting: Physical Medicine & Rehabilitation

## 2019-10-21 DIAGNOSIS — I6930 Unspecified sequelae of cerebral infarction: Secondary | ICD-10-CM | POA: Diagnosis not present

## 2019-10-22 DIAGNOSIS — I6930 Unspecified sequelae of cerebral infarction: Secondary | ICD-10-CM | POA: Diagnosis not present

## 2019-10-23 DIAGNOSIS — I6930 Unspecified sequelae of cerebral infarction: Secondary | ICD-10-CM | POA: Diagnosis not present

## 2019-10-24 DIAGNOSIS — I6930 Unspecified sequelae of cerebral infarction: Secondary | ICD-10-CM | POA: Diagnosis not present

## 2019-10-25 DIAGNOSIS — I6930 Unspecified sequelae of cerebral infarction: Secondary | ICD-10-CM | POA: Diagnosis not present

## 2019-10-26 DIAGNOSIS — I6930 Unspecified sequelae of cerebral infarction: Secondary | ICD-10-CM | POA: Diagnosis not present

## 2019-10-28 DIAGNOSIS — I6930 Unspecified sequelae of cerebral infarction: Secondary | ICD-10-CM | POA: Diagnosis not present

## 2019-10-29 DIAGNOSIS — I6930 Unspecified sequelae of cerebral infarction: Secondary | ICD-10-CM | POA: Diagnosis not present

## 2019-10-30 DIAGNOSIS — I6930 Unspecified sequelae of cerebral infarction: Secondary | ICD-10-CM | POA: Diagnosis not present

## 2019-10-31 DIAGNOSIS — I6930 Unspecified sequelae of cerebral infarction: Secondary | ICD-10-CM | POA: Diagnosis not present

## 2019-11-01 DIAGNOSIS — I6930 Unspecified sequelae of cerebral infarction: Secondary | ICD-10-CM | POA: Diagnosis not present

## 2019-11-02 DIAGNOSIS — I6930 Unspecified sequelae of cerebral infarction: Secondary | ICD-10-CM | POA: Diagnosis not present

## 2019-11-04 DIAGNOSIS — I6930 Unspecified sequelae of cerebral infarction: Secondary | ICD-10-CM | POA: Diagnosis not present

## 2019-11-05 ENCOUNTER — Other Ambulatory Visit: Payer: Self-pay

## 2019-11-05 ENCOUNTER — Encounter: Payer: Medicaid Other | Admitting: Physical Medicine & Rehabilitation

## 2019-11-05 ENCOUNTER — Encounter: Payer: Self-pay | Admitting: Physical Medicine & Rehabilitation

## 2019-11-05 VITALS — BP 145/83 | HR 71 | Temp 97.7°F | Ht 67.0 in | Wt 166.4 lb

## 2019-11-05 DIAGNOSIS — G8191 Hemiplegia, unspecified affecting right dominant side: Secondary | ICD-10-CM | POA: Diagnosis not present

## 2019-11-05 DIAGNOSIS — G8111 Spastic hemiplegia affecting right dominant side: Secondary | ICD-10-CM

## 2019-11-05 DIAGNOSIS — E1165 Type 2 diabetes mellitus with hyperglycemia: Secondary | ICD-10-CM | POA: Diagnosis not present

## 2019-11-05 DIAGNOSIS — I6930 Unspecified sequelae of cerebral infarction: Secondary | ICD-10-CM | POA: Diagnosis not present

## 2019-11-05 DIAGNOSIS — G8110 Spastic hemiplegia affecting unspecified side: Secondary | ICD-10-CM | POA: Diagnosis not present

## 2019-11-05 DIAGNOSIS — I10 Essential (primary) hypertension: Secondary | ICD-10-CM | POA: Diagnosis not present

## 2019-11-05 DIAGNOSIS — Z720 Tobacco use: Secondary | ICD-10-CM | POA: Diagnosis not present

## 2019-11-05 DIAGNOSIS — M7501 Adhesive capsulitis of right shoulder: Secondary | ICD-10-CM

## 2019-11-05 DIAGNOSIS — I639 Cerebral infarction, unspecified: Secondary | ICD-10-CM | POA: Diagnosis not present

## 2019-11-05 DIAGNOSIS — R269 Unspecified abnormalities of gait and mobility: Secondary | ICD-10-CM

## 2019-11-05 MED ORDER — DICLOFENAC SODIUM 1 % EX GEL: 2.0000 g | Freq: Four times a day (QID) | CUTANEOUS | 1 refills | Status: DC

## 2019-11-05 MED FILL — DICLOFENAC SODIUM 1% GEL: 1 | 25 days supply | Qty: 300 | Fill #0

## 2019-11-05 NOTE — Progress Notes (Signed)
Subjective:    Patient ID: Casey Petty, male    DOB: 18-Sep-1960, 59 y.o.   MRN: [id number]  HPI Male with history of HTN--no medicine 3 years presents for follow up for left basal ganglia stroke.  Last clinic visit on 10/07/2019.  He had intra-articular shoulder injection at that time.  Since that time, patient states no issues with injection.  He notes improvement in ROM.  He notes benefit with increase in Baclofen.  He still has not follow up with Neurology. BP is relatively controlled.  Denies falls.  Smoking 2 cig/day.  Pain Inventory Average Pain 0 Pain Right Now 8 My pain is sharp  In the last 24 hours, has pain interfered with the following? General activity 5 Relation with others 4 Enjoyment of life 4 What TIME of day is your pain at its worst? night Sleep (in general) Poor  Pain is worse with: some activites Pain improves with: medication, injections and . Relief from Meds: 4  Mobility ability to climb steps?  no do you drive?  no use a wheelchair  Function disabled: date disabled .  Neuro/Psych No problems in this area tingling trouble walking spasms  Prior Studies Any changes since last visit?  no  Physicians involved in your care Any changes since last visit?  no   Family History  Problem Relation Age of Onset  . Heart attack Father   . Cancer Neg Hx    Social History   Socioeconomic History  . Marital status: Single    Spouse name: Not on file  . Number of children: Not on file  . Years of education: Not on file  . Highest education level: Not on file  Occupational History  . Not on file  Tobacco Use  . Smoking status: Current Every Day Smoker  . Smokeless tobacco: Never Used  . Tobacco comment: Smoking 4 cigs/day  Substance and Sexual Activity  . Alcohol use: Yes    Alcohol/week: 2.0 standard drinks    Types: 2 Standard drinks or equivalent per week  . Drug use: No  . Sexual activity: Never  Other Topics Concern  . Not on file   Social History Narrative  . Not on file   Social Determinants of Health   Financial Resource Strain:   . Difficulty of Paying Living Expenses:   Food Insecurity:   . Worried About Charity fundraiser in the Last Year:   . Arboriculturist in the Last Year:   Transportation Needs:   . Film/video editor (Medical):   Marland Kitchen Lack of Transportation (Non-Medical):   Physical Activity:   . Days of Exercise per Week:   . Minutes of Exercise per Session:   Stress:   . Feeling of Stress :   Social Connections:   . Frequency of Communication with Friends and Family:   . Frequency of Social Gatherings with Friends and Family:   . Attends Religious Services:   . Active Member of Clubs or Organizations:   . Attends Archivist Meetings:   Marland Kitchen Marital Status:    Past Surgical History:  Procedure Laterality Date  . NO PAST SURGERIES     Past Medical History:  Diagnosis Date  . Hypertension    BP (!) 145/83   Pulse 71   Temp 97.7 F (36.5 C)   Ht 5\' 7"  (1.702 m)   Wt 166 lb 6.4 oz (75.5 kg)   SpO2 93%   BMI 26.06 kg/m  Opioid Risk Score:   Fall Risk Score:  `1  Depression screen PHQ 2/9  Depression screen [facility] 2/9 06/20/2019 05/01/2019 03/27/2019 06/27/2014  Decreased Interest 0 0 0 0  Down, Depressed, Hopeless 0 0 0 0  PHQ - 2 Score 0 0 0 0   Review of Systems  Constitutional: Negative.   HENT: Negative.   Eyes: Negative.   Respiratory: Negative.   Cardiovascular: Negative.   Gastrointestinal: Negative.   Endocrine: Negative.   Genitourinary: Negative.   Musculoskeletal: Positive for gait problem.  Skin: Negative.   Allergic/Immunologic: Negative.   Neurological: Positive for weakness.  Hematological: Negative.   Psychiatric/Behavioral: Negative.       Objective:   Physical Exam  Constitutional: NAD.  Respiratory: Normal effort.  Musc:  Right shoulder with pain with ER, improved Neurologic: Alert Gait:  Dysarthria, stable Motor:  RUE: Shoulder  abduction 3-/5 (pain inhibition), elbow flexion 1/5, elbow extension 3-/5, handgrip 2/5 with apraxia Right lower extremity: Hip flexion, knee extension 4/5, ankle dorsiflexion 0/5 MAS: right shoulder abdcutors 1+/4, elbow flexors 2/4, wrist flexors 2/4, finger flexors 1/4    Assessment & Plan:  Male with history of HTN--no medicine 3 years presents for follow up for left basal ganglia stroke.  1. 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   Cont HEP  Will increase Baclofen 30 TID  Follow up with Neurology, needs appointment, reminded again x3  Botulinum toxin injection:   Right  Mec Major: 20 units    Biceps: 100    FCR  50    FCU  50    Right  Gastroc Med 40    Lastroc Lat 40  2. HTN:   Relatively controlled today  Cont follow up with PCP  3. Gait abnormality  Cont HEP  Encouraged ambulation with AFO  4. Tobacco abuse  2/day, encouraged abstinence x4  5. Adhesive capsulitis  Some benefit with with intraarticular injection on 3/1  Will order Voltaren gel

## 2019-11-05 NOTE — Progress Notes (Deleted)
   Subjective:    Patient ID: Casey Petty, male    DOB: [birth year], 59 y.o.   MRN: [id number]  HPI  Pain Inventory Average Pain 0 Pain Right Now 8 My pain is sharp  In the last 24 hours, has pain interfered with the following? General activity 5 Relation with others 4 Enjoyment of life 4 What TIME of day is your pain at its worst? night Sleep (in general) Poor  Pain is worse with: some activites Pain improves with: medication Relief from Meds: 4  Mobility ability to climb steps?  no do you drive?  no use a wheelchair  Function disabled: date disabled .  Neuro/Psych tingling trouble walking spasms  Prior Studies .  Physicians involved in your care .   Family History  Problem Relation Age of Onset  . Heart attack Father   . Cancer Neg Hx    Social History   Socioeconomic History  . Marital status: Single    Spouse name: Not on file  . Number of children: Not on file  . Years of education: Not on file  . Highest education level: Not on file  Occupational History  . Not on file  Tobacco Use  . Smoking status: Current Every Day Smoker  . Smokeless tobacco: Never Used  . Tobacco comment: Smoking 4 cigs/day  Substance and Sexual Activity  . Alcohol use: Yes    Alcohol/week: 2.0 standard drinks    Types: 2 Standard drinks or equivalent per week  . Drug use: No  . Sexual activity: Never  Other Topics Concern  . Not on file  Social History Narrative  . Not on file   Social Determinants of Health   Financial Resource Strain:   . Difficulty of Paying Living Expenses:   Food Insecurity:   . Worried About Charity fundraiser in the Last Year:   . Arboriculturist in the Last Year:   Transportation Needs:   . Film/video editor (Medical):   Marland Kitchen Lack of Transportation (Non-Medical):   Physical Activity:   . Days of Exercise per Week:   . Minutes of Exercise per Session:   Stress:   . Feeling of Stress :   Social Connections:   . Frequency of  Communication with Friends and Family:   . Frequency of Social Gatherings with Friends and Family:   . Attends Religious Services:   . Active Member of Clubs or Organizations:   . Attends Archivist Meetings:   Marland Kitchen Marital Status:    Past Surgical History:  Procedure Laterality Date  . NO PAST SURGERIES     Past Medical History:  Diagnosis Date  . Hypertension    Temp 97.7 F (36.5 C)   Ht 5\' 7"  (1.702 m)   Wt 166 lb 6.4 oz (75.5 kg)   BMI 26.06 kg/m   Opioid Risk Score:   Fall Risk Score:  `1  Depression screen PHQ 2/9  Depression screen [facility] 2/9 06/20/2019 05/01/2019 03/27/2019 06/27/2014  Decreased Interest 0 0 0 0  Down, Depressed, Hopeless 0 0 0 0  PHQ - 2 Score 0 0 0 0    Review of Systems  All other systems reviewed and are negative.      Objective:   Physical Exam        Assessment & Plan:

## 2019-11-06 DIAGNOSIS — I6930 Unspecified sequelae of cerebral infarction: Secondary | ICD-10-CM | POA: Diagnosis not present

## 2019-11-07 ENCOUNTER — Other Ambulatory Visit: Payer: Self-pay

## 2019-11-07 ENCOUNTER — Encounter: Payer: Self-pay | Admitting: Physical Medicine & Rehabilitation

## 2019-11-07 ENCOUNTER — Encounter: Payer: Medicaid Other | Attending: Registered Nurse | Admitting: Physical Medicine & Rehabilitation

## 2019-11-07 VITALS — BP 157/86 | HR 72 | Ht 67.0 in | Wt 166.0 lb

## 2019-11-07 DIAGNOSIS — I6930 Unspecified sequelae of cerebral infarction: Secondary | ICD-10-CM | POA: Diagnosis not present

## 2019-11-07 DIAGNOSIS — G8111 Spastic hemiplegia affecting right dominant side: Secondary | ICD-10-CM | POA: Diagnosis not present

## 2019-11-07 DIAGNOSIS — G8191 Hemiplegia, unspecified affecting right dominant side: Secondary | ICD-10-CM | POA: Insufficient documentation

## 2019-11-07 DIAGNOSIS — I639 Cerebral infarction, unspecified: Secondary | ICD-10-CM | POA: Diagnosis not present

## 2019-11-07 DIAGNOSIS — E1165 Type 2 diabetes mellitus with hyperglycemia: Secondary | ICD-10-CM | POA: Diagnosis not present

## 2019-11-07 DIAGNOSIS — I10 Essential (primary) hypertension: Secondary | ICD-10-CM | POA: Insufficient documentation

## 2019-11-07 NOTE — Progress Notes (Signed)
Botox: Procedure Note Patient Name: Casey Petty DOB: [birth year] MRN: [id number]  Date: 11/07/19   Procedure: Botulinum toxin administration Guidance: EMG Diagnosis: Spastic Hemiparesis Attending: Delice Lesch, MD   Trade name: Botox (onabotulinumtoxinA)  Informed consent: Risks, benefits & options of the procedure are explained to the patient (and/or family). The patient elects to proceed with procedure. Risks include but are not limited to weakness, respiratory distress, dry mouth, ptosis, antibody formation, worsening of some areas of function. Benefits include decreased abnormal muscle tone, improved hygiene and positioning, decreased skin breakdown and, in some cases, decreased pain. Options include conservative management with oral antispasticity agents, phenol chemodenervation of nerve or at motor nerve branches. More invasive options include intrathecal balcofen adminstration for appropriate candidates. Surgical options may include tendon lengthening or transposition or, rarely, dorsal rhizotomy.   History/Physical Examination: 59 y.o. male with history of HTN presents for follow up for left basal ganglia stroke.  mAS Right  Shoulder abductions 2/4    Elbow flexors 3/4    Wrist flextors 3/4     Ankle dorsiflexors 1/4  Previous Treatments: Therapy/Range of motion Indication for guidance: Target active muscules  Procedure: Botulinum toxin was mixed with preservative free saline with a dilution of 1cc to 100 units. Targeted limb and muscles were identified. The skin was prepped with alcohol swabs and placement of needle tip in targeted muscle was confirmed using appropriate guidance. Prior to injection, positioning of needle tip outside of blood vessel was determined by pulling back on syringe plunger.  MUSCLE UNITS  Right Pec Major 20 units Right biceps med: 100 units Right FCR: 50 units Right FCU 50 units  Right Gastoc Med 40 Right Gastoc Lat 40  Total units used: [id number]  units  Complications: None. Patient jumped during procedure, however, needle was maintained with good EMG feedback prior to continuing with medication administration. Plan: Recommended follow with therapies, if limited visits hold for 6 weeks  Barnard Sharps Lorie Phenix 9:48 AM

## 2019-11-08 DIAGNOSIS — I6930 Unspecified sequelae of cerebral infarction: Secondary | ICD-10-CM | POA: Diagnosis not present

## 2019-11-09 DIAGNOSIS — I6930 Unspecified sequelae of cerebral infarction: Secondary | ICD-10-CM | POA: Diagnosis not present

## 2019-11-11 DIAGNOSIS — I6930 Unspecified sequelae of cerebral infarction: Secondary | ICD-10-CM | POA: Diagnosis not present

## 2019-11-12 DIAGNOSIS — I6930 Unspecified sequelae of cerebral infarction: Secondary | ICD-10-CM | POA: Diagnosis not present

## 2019-11-13 DIAGNOSIS — I6930 Unspecified sequelae of cerebral infarction: Secondary | ICD-10-CM | POA: Diagnosis not present

## 2019-11-14 DIAGNOSIS — I6930 Unspecified sequelae of cerebral infarction: Secondary | ICD-10-CM | POA: Diagnosis not present

## 2019-11-15 DIAGNOSIS — I6930 Unspecified sequelae of cerebral infarction: Secondary | ICD-10-CM | POA: Diagnosis not present

## 2019-11-16 DIAGNOSIS — I6930 Unspecified sequelae of cerebral infarction: Secondary | ICD-10-CM | POA: Diagnosis not present

## 2019-11-18 DIAGNOSIS — I6930 Unspecified sequelae of cerebral infarction: Secondary | ICD-10-CM | POA: Diagnosis not present

## 2019-11-19 DIAGNOSIS — I6930 Unspecified sequelae of cerebral infarction: Secondary | ICD-10-CM | POA: Diagnosis not present

## 2019-11-20 DIAGNOSIS — I6930 Unspecified sequelae of cerebral infarction: Secondary | ICD-10-CM | POA: Diagnosis not present

## 2019-11-21 DIAGNOSIS — I6930 Unspecified sequelae of cerebral infarction: Secondary | ICD-10-CM | POA: Diagnosis not present

## 2019-11-22 DIAGNOSIS — I6930 Unspecified sequelae of cerebral infarction: Secondary | ICD-10-CM | POA: Diagnosis not present

## 2019-11-23 DIAGNOSIS — I6930 Unspecified sequelae of cerebral infarction: Secondary | ICD-10-CM | POA: Diagnosis not present

## 2019-11-25 DIAGNOSIS — I6930 Unspecified sequelae of cerebral infarction: Secondary | ICD-10-CM | POA: Diagnosis not present

## 2019-11-25 MED FILL — DICLOFENAC SODIUM 1% GEL: 1 | 37 days supply | Qty: 300 | Fill #1

## 2019-11-26 ENCOUNTER — Ambulatory Visit: Payer: Medicaid Other | Attending: Physical Medicine & Rehabilitation

## 2019-11-26 ENCOUNTER — Other Ambulatory Visit: Payer: Self-pay

## 2019-11-26 ENCOUNTER — Ambulatory Visit: Payer: Medicaid Other | Admitting: Occupational Therapy

## 2019-11-26 DIAGNOSIS — M6281 Muscle weakness (generalized): Secondary | ICD-10-CM | POA: Insufficient documentation

## 2019-11-26 DIAGNOSIS — G8929 Other chronic pain: Secondary | ICD-10-CM

## 2019-11-26 DIAGNOSIS — I69351 Hemiplegia and hemiparesis following cerebral infarction affecting right dominant side: Secondary | ICD-10-CM | POA: Diagnosis not present

## 2019-11-26 DIAGNOSIS — R2689 Other abnormalities of gait and mobility: Secondary | ICD-10-CM | POA: Insufficient documentation

## 2019-11-26 DIAGNOSIS — R2681 Unsteadiness on feet: Secondary | ICD-10-CM

## 2019-11-26 DIAGNOSIS — I6930 Unspecified sequelae of cerebral infarction: Secondary | ICD-10-CM | POA: Diagnosis not present

## 2019-11-26 DIAGNOSIS — M25511 Pain in right shoulder: Secondary | ICD-10-CM | POA: Insufficient documentation

## 2019-11-26 NOTE — Therapy (Signed)
[facility] [address] [place], [place], [address] Phone: [phone]   Fax:  [phone]  Occupational Therapy Evaluation  Patient Details  Name: Casey Petty MRN: [id number] Date of Birth: [birth year] Referring Provider (OT): Dr. Delice Lesch   Encounter Date: 11/26/2019  OT End of Session - 11/26/19 0913    Visit Number  1    Number of Visits  13    Authorization Type  MCD - awaiting authorization    OT Start Time  0830    OT Stop Time  0915    OT Time Calculation (min)  45 min    Activity Tolerance  Patient tolerated treatment well    Behavior During Therapy  [facility] for tasks assessed/performed       Past Medical History:  Diagnosis Date  . Hypertension     Past Surgical History:  Procedure Laterality Date  . NO PAST SURGERIES      There were no vitals filed for this visit.  Subjective Assessment - 11/26/19 0829    Patient is accompanied by:  Family member   fiancee   Pertinent History  Lt BG stroke 02/28/19 w/ residual Rt dominant side hemiparesis, adhesive capsulitis of Rt shoulder, **RECENT botox injections on 11/07/19. PMH: HTN    Currently in Pain?  No/denies   however does have pain Rt shoulder in higher ranges       [facility] OT Assessment - 11/26/19 0001      Assessment   Medical Diagnosis  Spastic hemiparesis dominant Rt side   secondary to Lt BG stroke   Referring Provider (OT)  Dr. Delice Lesch    Onset Date/Surgical Date  02/28/19    Hand Dominance  Right    Prior Therapy  [facility] therapies      Precautions   Precautions  Fall      Balance Screen   Has the patient fallen in the past 6 months  No    Has the patient had a decrease in activity level because of a fear of falling?   No      Home  Environment   Bathroom Shower/Tub  Tub/Shower unit;Curtain    Home Equipment  Tub bench;Bedside commode;Walker - 2 wheels;Cane - single point;Wheelchair - manual    Additional Comments  Pt lives w/  fiancee on 3rd floor apartment, railing on Lt side going down    Lives With  Significant other      Prior Function   Level of Independence  Independent    Vocation  On disability    Vocation Requirements  was working in traffic control prior to stroke      ADL   Eating/Feeding  Modified independent    Grooming  Modified independent    Upper Body Bathing  Moderate assistance   for Lt side and back   Lower Body Bathing  Modified independent    Upper Body Dressing  Increased time    Lower Body Dressing  Increased time    Toilet Transfer  Modified independent    Toileting - Clothing Manipulation  Modified independent    Wilder Transfer  Modified independent      IADL   Shopping  Needs to be accompanied on any shopping trip    Light Housekeeping  --   Pt puts laundry in washer/dryer, fiancee has to fold   Meal Prep  Able to complete simple cold meal and snack prep;Able  to complete simple warm meal prep   fiancee does cooking   Chief Operating Officer on family or friends for transportation;Travels on public transportation when accompanied by another    Medication Management  Takes responsibility if medication is prepared in advance in seperate dosage      Mobility   Mobility Status  Needs assist    Mobility Status Comments  cane use in apt, w/c in community      Written Expression   Dominant Hand  Right      Vision - History   Baseline Vision  No visual deficits    Additional Comments  denies changes since stroke      Observation/Other Assessments   Observations  pt appears slightly dysarthric, ? mild expressive aphasia      Sensation   Additional Comments  diminshed RT hand but can still detect stimuli      Coordination   Coordination  Pt unable to use Rt hand for coordination, inconsistently as stabalizer only      Edema   Edema  mild edema Rt hand      Tone   Assessment Location  Right Upper Extremity      ROM  / Strength   AROM / PROM / Strength  AROM;PROM      AROM   Overall AROM Comments  Pt dominated by synergy pattern going into approx 80* abduction w/ IR, elbow flex, pronation. No active supination, wrist to neutral, minimal active finger extension, but more active finger ext if wrist in flexion. Finger flex 90%      PROM   Overall PROM Comments  Pt w/ pain with PROM in sh flexion to approx 70*. Elbow ext and supination 75% w/ limitations in spasticity. Full passive finger ext w/ wrist neutral to slight extension.       RUE Tone   RUE Tone  Hypertonic;Modified Ashworth      RUE Tone   Hypertonic Details  More spastic w/ elbow ext, supination, and wrist extension. Less so with finger extension    Modified Ashworth Scale for Grading Hypertonia RUE  Considerable increase in muschle tone, passive movement difficult   elbow and wrist, 1+ for fingers                       OT Short Term Goals - 11/26/19 0919      OT SHORT TERM GOAL #1   Title  Independent with HEP for RUE    Baseline  Dependent, not yet issued    Time  3    Period  Weeks    Status  New      OT SHORT TERM GOAL #2   Title  Independent with resting hand splint for pm wear    Baseline  dependent, not yet issued    Time  3    Period  Weeks    Status  New      OT SHORT TERM GOAL #3   Title  Pt to verbalize understanding with proper positioning of RUE during stretching HEP and at night to reduce pain Rt shoulder    Baseline  dependent, not yet issued    Time  3    Period  Weeks    Status  New        OT Long Term Goals - 11/26/19 [id number]      OT LONG TERM GOAL #1   Title  Pt to verbalize understanding of AE needs to increase  independence and safety with ADLS (including walker splint, shoe buttons, one handed cutting board)    Baseline  Dependent    Time  6    Period  Weeks    Status  New      OT LONG TERM GOAL #2   Title  Pt to report shoulder pain less than or equal to 5/10 with P/ROM to 90*     Baseline  up to 10/10    Time  6    Period  Weeks    Status  New      OT LONG TERM GOAL #3   Title  Pt to use Rt hand as stabalizer for simple bilateral tasks consistently    Baseline  inconsistent    Time  6    Period  Weeks    Status  New            Plan - 11/26/19 0914    Clinical Impression Statement  Pt is a 59 y.o. male who presents to [facility] for spastic hemiparesis Rt dominant side following Lt basal ganglia infarct on 02/28/19. Pt presents today with significant spasticity RUE w/ recent botox injections on 11/07/19, pain in Rt shoulder and would benefit from OT to address these deficits, provide HEP for proper stretching of RUE, splint fabrication, and possible A/E to increase independence and safety with ADLS.    OT Occupational Profile and History  Detailed Assessment- Review of Records and additional review of physical, cognitive, psychosocial history related to current functional performance    Occupational performance deficits (Please refer to evaluation for details):  ADL's;IADL's;Rest and Sleep;Social Participation    Body Structure / Function / Physical Skills  ADL;ROM;Balance;IADL;Improper spinal/pelvic alignment;Body mechanics;Sensation;Mobility;Flexibility;Strength;Coordination;Tone;UE functional use;Pain;Decreased knowledge of use of DME;Proprioception;FMC    Rehab Potential  Good    Clinical Decision Making  Several treatment options, min-mod task modification necessary    Comorbidities Affecting Occupational Performance:  Presence of comorbidities impacting occupational performance    Comorbidities impacting occupational performance description:  Adhesive capsulitis Rt shoulder    Modification or Assistance to Complete Evaluation   Min-Moderate modification of tasks or assist with assess necessary to complete eval    OT Frequency  2x / week    OT Duration  6 weeks   (Plus eval)   OT Treatment/Interventions  Self-care/ADL training;Therapeutic exercise;Functional  Mobility Training;Aquatic Therapy;Ultrasound;Neuromuscular education;Manual Therapy;Splinting;Therapeutic activities;Coping strategies training;DME and/or AE instruction;Cognitive remediation/compensation;Electrical Stimulation;Moist Heat;Passive range of motion;Patient/family education    Plan  initiate HEP for RUE (Following session: resting hand splint)    Consulted and Agree with Plan of Care  Patient;Family member/caregiver    Family Member [employer]       Patient will benefit from skilled therapeutic intervention in order to improve the following deficits and impairments:   Body Structure / Function / Physical Skills: ADL, ROM, Balance, IADL, Improper spinal/pelvic alignment, Body mechanics, Sensation, Mobility, Flexibility, Strength, Coordination, Tone, UE functional use, Pain, Decreased knowledge of use of DME, Proprioception, [place]       Visit Diagnosis: Spastic hemiparesis of right dominant side as late effect of cerebral infarction (HCC)  Chronic right shoulder pain  Unsteadiness on feet    Problem List Patient Active Problem List   Diagnosis Date Noted  . Adhesive capsulitis of right shoulder 10/07/2019  . Abnormality of gait 09/17/2019  . Spastic hemiparesis of right dominant side ([place]) 07/02/2019  . History of CVA with residual deficit 06/09/2019  . Spastic hemiparesis ([place])   . Uncontrolled type 2  diabetes mellitus with hyperglycemia ([place])   . Noncompliance   . Benign essential HTN   . Newly diagnosed diabetes ([place])   . Leucocytosis   . Right hemiparesis ([place])   . Basal ganglia stroke ([place]) 03/04/2019  . Stroke ([place]) 02/28/2019  . Essential hypertension 02/28/2019  . Tobacco abuse 02/28/2019  . Hyperglycemia 02/28/2019    Carey Bullocks, OTR/L 11/26/2019, 10:43 AM  [facility] [address] [place], [place], [address] Phone: [phone]   Fax:  [phone]  Name: Ardean Elko MRN: [id number] Date of Birth: 01-27-1961

## 2019-11-26 NOTE — Therapy (Signed)
[facility] [address] [place], [place], [address] Phone: [phone]   Fax:  [phone]  Physical Therapy Evaluation  Patient Details  Name: Casey Petty MRN: [id number] Date of Birth: 26-Dec-1960 Referring Provider (PT): Ankit Lorie Phenix, MD   Encounter Date: 11/26/2019  PT End of Session - 11/26/19 1030    Visit Number  1    Number of Visits  4    Date for PT Re-Evaluation  02/24/20   POC for 7 weeks, Cert for 90 days   Authorization Type  Medicaid - Pending Authorization    PT Start Time  0935    PT Stop Time  1015    PT Time Calculation (min)  40 min    Equipment Utilized During Treatment  Gait belt    Activity Tolerance  Patient tolerated treatment well    Behavior During Therapy  [facility] for tasks assessed/performed       Past Medical History:  Diagnosis Date  . Hypertension     Past Surgical History:  Procedure Laterality Date  . NO PAST SURGERIES      There were no vitals filed for this visit.   Subjective Assessment - 11/26/19 0936    Subjective  Patient reported that he had a stroke on 02/28/2019 in which he was hospitalized. Reports that he has R sided weakness in his leg and arm, with more difficulty with his arm. Currently is walking within his home with a cane. Reports he also uses a manual wheelchair at times outside of the home. Recieved an AFO while in the hospital, not wearing it today. Spouse reporting that they are able to make it up to there third floor apartment, she has to assist him. Has also been getting botox in both his RUE and RLE.    Patient is accompained by:  Family member    Pertinent History  HTN    Limitations  Walking;Standing    Patient Stated Goals  Get back to how life was prior to the stroke.    Currently in Pain?  No/denies         [facility] PT Assessment - 11/26/19 0944      Assessment   Medical Diagnosis  Left Lacunar Infarct     Referring Provider (PT)  Ankit  Lorie Phenix, MD    Onset Date/Surgical Date  02/28/19    Hand Dominance  Right    Prior Therapy  [facility] therapies      Precautions   Precautions  Fall      Balance Screen   Has the patient fallen in the past 6 months  No    Has the patient had a decrease in activity level because of a fear of falling?   No    Is the patient reluctant to leave their home because of a fear of falling?   No      Home Environment   Living Environment  Private residence    Living Arrangements  Spouse/significant other    Available Help at Discharge  Family    Type of [place] to enter    Entrance Stairs-Number of Steps  14    Entrance Stairs-Rails  Right   on right side going up   [place] - single point;Wheelchair - [employer] - 2 wheels;Tub bench      Prior Function   Level of Independence  Independent    Vocation  On disability    Vocation Requirements  was working in traffic control prior to stroke    Leisure  "nothing much"      Cognition   Overall Cognitive Status  Within Functional Limits for tasks assessed      Sensation   Light Touch  Appears Intact    Additional Comments  slightly dimished on RLE compared to LLE      Coordination   Gross Motor Movements are Fluid and Coordinated  Yes   diifficult to assess on RLE due to spasticity     Tone   Assessment Location  Right Lower Extremity      ROM / Strength   AROM / PROM / Strength  AROM;Strength      AROM   Overall AROM   Within functional limits for tasks performed    Overall AROM Comments  lacking -4 deg of knee extension on RLE with AROM      Strength   Strength Assessment Site  Hip;Knee;Ankle    Right/Left Hip  Right;Left    Right Hip Flexion  4/5    Right Hip ABduction  4-/5    Right Hip ADduction  4-/5    Left Hip Flexion  5/5    Left Hip ABduction  5/5    Left Hip ADduction  5/5    Right/Left Knee  Right;Left    Right Knee Flexion  4-/5    Right  Knee Extension  4/5    Left Knee Flexion  5/5    Left Knee Extension  5/5    Right/Left Ankle  Right;Left    Right Ankle Dorsiflexion  3-/5    Left Ankle Dorsiflexion  5/5      Transfers   Transfers  Sit to Stand;Stand to Sit    Sit to Stand  5: Supervision;4: Min guard    Five time sit to stand comments   22.92 seconds, completed without UE support    Stand to Sit  5: Supervision;4: Min guard    Comments  Patient requiring verbal cues for brake management to ensure brakes are locked prior to standing for improved safety      Ambulation/Gait   Ambulation/Gait  Yes    Ambulation/Gait Assistance  4: Min guard    Ambulation Distance (Feet)  25 Feet    Assistive device  Straight cane    Gait Pattern  Decreased arm swing - left;Decreased arm swing - right;Decreased step length - right;Decreased step length - left;Decreased stance time - right;Decreased hip/knee flexion - right;Decreased dorsiflexion - right;Decreased weight shift to right;Poor foot clearance - right    Ambulation Surface  Level;Indoor      Standardized Balance Assessment   Standardized Balance Assessment  Timed Up and Go Test      Timed Up and Go Test   TUG  Normal TUG    Normal TUG (seconds)  39.69    TUG Comments  Completed with SPC and CGA as needed. Patietn demonstrating decreased R foot clearance.       RUE Tone   RUE Tone  --      RUE Tone   Modified Ashworth Scale for Grading Hypertonia RUE  --      RLE Tone   RLE Tone  Modified Ashworth;Hypertonic      RLE Tone   Hypertonic Details  more spasticity w/ knee flexion    Modified Ashworth Scale for Grading Hypertonia RLE  More marked increase in muscle  tone through most of the ROM, but affected part(s) easily moved                Objective measurements completed on examination: See above findings.              PT Education - 11/26/19 1029    Education Details  Educated patient on POC and evaluation findings.    Person(s) Educated   Patient;Spouse    Methods  Explanation    Comprehension  Verbalized understanding       PT Short Term Goals - 11/26/19 1032      PT SHORT TERM GOAL #1   Title  Patient will be independent with initial HEP    Baseline  No HEP initiated at this time    Time  3    Period  Weeks    Status  New    Target Date  12/24/19      PT SHORT TERM GOAL #2   Title  Berg Balance will be assessed and LTG written based upon results    Baseline  Not assessed at this time    Time  3    Period  Weeks    Status  New    Target Date  12/24/19      PT SHORT TERM GOAL #3   Title  Patient will decreased TUG from 39.69 seconds to 32 seconds with SPC and CGA    Baseline  39.69 seconds    Time  3    Period  Weeks    Status  New    Target Date  12/24/19        PT Long Term Goals - 11/26/19 1043      PT LONG TERM GOAL #1   Title  Patient will be independent with final HEP for maintaining functional gains and self management    Baseline  No HEP at this time    Time  7    Period  Weeks    Status  New    Target Date  01/21/20      PT LONG TERM GOAL #2   Title  Patient will improve Berg Balance Score by 10 points from initial score to demonstrate improved balance and reduce risk of falls    Baseline  Not assessed at this time    Time  7    Period  Weeks    Status  New    Target Date  01/21/20      PT LONG TERM GOAL #3   Title  Patient will demonstrate ability to ascend/descend 14 steps w/ single rail on R with supv for improved safety and mobility within the home    Time  7    Period  Weeks    Status  New    Target Date  01/21/20      PT LONG TERM GOAL #4   Title  Patient will improve TUG to <25 seconds with SPC and Supv for decreased risk of falls    Baseline  39.69 secs    Time  7    Period  Weeks    Status  New    Target Date  01/21/20      PT LONG TERM GOAL #5   Title  Patient will improve 5 sit <>stand to <15 seconds for improve BLE strength and reduce risk for falls    Baseline   22.92 secs    Time  7    Period  Weeks  Status  New    Target Date  01/21/20             Plan - 11/26/19 1033    Clinical Impression Statement  Patient is a 59 y.o. male that was referred to Neuro OPPT for a left lacunar infarct that occurred on 03/01/2019, in which he was hospitalized Patients past medical history includes HTN. The following deficits were present during the exam: altered sensation through the right lower extremity, impaired RLE strength, gait abnormalities, and impaired balance. Patient is currently using a cane for ambulation inside of the home and using a manual wheelchair for longer distance outside of the home. Patient reporting that he has a AFO at home in which he does not wear frequently due to comfort issues, however, notices his toe drags when he is not wearing. Educated to bring AFO to next session to allow for further gait assessment. Patient demonstrates at an increased risk for falls with results of TUG, and 5x sit <> stand with today's assessment. Further assessment of balance is warranted with next treatment session. Pt will benefit from skilled PT services to address strength impairments, balance impairments, and functional mobility impairments and to return to PLOF.    Personal Factors and Comorbidities  Time since onset of injury/illness/exacerbation;Transportation;Comorbidity 1    Comorbidities  HTN    Examination-Activity Limitations  Transfers;Stairs;Stand;Squat    Examination-Participation Restrictions  Community Activity    Stability/Clinical Decision Making  Evolving/Moderate complexity    Clinical Decision Making  Moderate    Rehab Potential  Fair    PT Frequency  1x / week    PT Duration  3 weeks   followed by 2x/week for 4 weeks   PT Treatment/Interventions  Cryotherapy;Electrical Stimulation;Moist Heat;DME Instruction;Gait training;Stair training;Functional mobility training;Therapeutic activities;Therapeutic exercise;Balance  training;Neuromuscular re-education;Patient/family education;Orthotic Fit/Training;Passive range of motion    PT Next Visit Plan  Assess Berg Balance and Gait Speed (Update Goals), Did patient bring AFO? Initiate HEP    Consulted and Agree with Plan of Care  Patient;Family member/caregiver    Family Member Consulted  Spouse       Patient will benefit from skilled therapeutic intervention in order to improve the following deficits and impairments:  Abnormal gait, Decreased balance, Decreased endurance, Decreased mobility, Difficulty walking, Impaired sensation, Impaired tone, Decreased range of motion, Decreased activity tolerance, Decreased knowledge of use of DME, Decreased safety awareness, Decreased strength, Pain  Visit Diagnosis: Muscle weakness (generalized)  Other abnormalities of gait and mobility  Hemiplegia and hemiparesis following cerebral infarction affecting right dominant side (HCC)  Unsteadiness on feet     Problem List Patient Active Problem List   Diagnosis Date Noted  . Adhesive capsulitis of right shoulder 10/07/2019  . Abnormality of gait 09/17/2019  . Spastic hemiparesis of right dominant side ([place]) 07/02/2019  . History of CVA with residual deficit 06/09/2019  . Spastic hemiparesis ([place])   . Uncontrolled type 2 diabetes mellitus with hyperglycemia ([place])   . Noncompliance   . Benign essential HTN   . Newly diagnosed diabetes ([place])   . Leucocytosis   . Right hemiparesis ([place])   . Basal ganglia stroke ([place]) 03/04/2019  . Stroke ([place]) 02/28/2019  . Essential hypertension 02/28/2019  . Tobacco abuse 02/28/2019  . Hyperglycemia 02/28/2019    Jones Bales, PT, DPT 11/26/2019, 11:11 AM  [facility] [address] [place], [place], [address] Phone: [phone]   Fax:  [phone]  Name: Mal Griscom MRN: [id number] Date  of Birth: Mar 25, 1961

## 2019-11-27 DIAGNOSIS — I6930 Unspecified sequelae of cerebral infarction: Secondary | ICD-10-CM | POA: Diagnosis not present

## 2019-11-28 DIAGNOSIS — I6930 Unspecified sequelae of cerebral infarction: Secondary | ICD-10-CM | POA: Diagnosis not present

## 2019-11-29 DIAGNOSIS — I6930 Unspecified sequelae of cerebral infarction: Secondary | ICD-10-CM | POA: Diagnosis not present

## 2019-11-30 DIAGNOSIS — I6930 Unspecified sequelae of cerebral infarction: Secondary | ICD-10-CM | POA: Diagnosis not present

## 2019-12-02 DIAGNOSIS — I6930 Unspecified sequelae of cerebral infarction: Secondary | ICD-10-CM | POA: Diagnosis not present

## 2019-12-03 DIAGNOSIS — I6930 Unspecified sequelae of cerebral infarction: Secondary | ICD-10-CM | POA: Diagnosis not present

## 2019-12-04 DIAGNOSIS — I6930 Unspecified sequelae of cerebral infarction: Secondary | ICD-10-CM | POA: Diagnosis not present

## 2019-12-05 DIAGNOSIS — I6930 Unspecified sequelae of cerebral infarction: Secondary | ICD-10-CM | POA: Diagnosis not present

## 2019-12-06 DIAGNOSIS — I6930 Unspecified sequelae of cerebral infarction: Secondary | ICD-10-CM | POA: Diagnosis not present

## 2019-12-07 DIAGNOSIS — I6930 Unspecified sequelae of cerebral infarction: Secondary | ICD-10-CM | POA: Diagnosis not present

## 2019-12-09 ENCOUNTER — Ambulatory Visit: Payer: Medicaid Other | Attending: Physical Medicine & Rehabilitation | Admitting: Occupational Therapy

## 2019-12-09 ENCOUNTER — Ambulatory Visit: Payer: Medicaid Other

## 2019-12-09 ENCOUNTER — Other Ambulatory Visit: Payer: Self-pay

## 2019-12-09 DIAGNOSIS — I69351 Hemiplegia and hemiparesis following cerebral infarction affecting right dominant side: Secondary | ICD-10-CM | POA: Insufficient documentation

## 2019-12-09 DIAGNOSIS — R2689 Other abnormalities of gait and mobility: Secondary | ICD-10-CM | POA: Diagnosis not present

## 2019-12-09 DIAGNOSIS — M25511 Pain in right shoulder: Secondary | ICD-10-CM | POA: Insufficient documentation

## 2019-12-09 DIAGNOSIS — M6281 Muscle weakness (generalized): Secondary | ICD-10-CM | POA: Insufficient documentation

## 2019-12-09 DIAGNOSIS — R2681 Unsteadiness on feet: Secondary | ICD-10-CM | POA: Insufficient documentation

## 2019-12-09 DIAGNOSIS — I6930 Unspecified sequelae of cerebral infarction: Secondary | ICD-10-CM | POA: Diagnosis not present

## 2019-12-09 DIAGNOSIS — G8929 Other chronic pain: Secondary | ICD-10-CM

## 2019-12-09 NOTE — Patient Instructions (Signed)
Access Code: [email or web address] URL: [email or web address] Date: 12/09/2019 Prepared by: Baldomero Lamy  Exercises Sit to Stand - 1 x daily - 7 x weekly - 3 sets - 10 reps Supine Bridge - 1 x daily - 7 x weekly - 10 reps - 3 sets Clamshell - 1 x daily - 7 x weekly - 3 sets - 10 reps Seated Toe Raise - 1 x daily - 7 x weekly - 2 sets - 10 reps Seated Hamstring Stretch - 1 x daily - 7 x weekly - 1 sets - 3 reps - 30 hold

## 2019-12-09 NOTE — Therapy (Signed)
[facility] [address] [place], [place], [address] Phone: [phone]   Fax:  [phone]  Physical Therapy Treatment  Patient Details  Name: Casey Petty MRN: [id number] Date of Birth: November 30, 1960 Referring Provider (PT): Ankit Lorie Phenix, MD   Encounter Date: 12/09/2019  PT End of Session - 12/09/19 0935    Visit Number  2    Number of Visits  4    Date for PT Re-Evaluation  02/24/20   POC for 7 weeks, Cert for 90 days   Authorization Type  Medicaid    Authorization Time Period  3 visits 5/03 - 5/23    Authorization - Visit Number  1    Authorization - Number of Visits  3    PT Start Time  0845    PT Stop Time  0931    PT Time Calculation (min)  46 min    Equipment Utilized During Treatment  Gait belt    Activity Tolerance  Patient tolerated treatment well    Behavior During Therapy  [facility] for tasks assessed/performed       Past Medical History:  Diagnosis Date  . Hypertension     Past Surgical History:  Procedure Laterality Date  . NO PAST SURGERIES      There were no vitals filed for this visit.  Subjective Assessment - 12/09/19 0847    Subjective  Patient reporting that he has been doing well, has been having some swelling in the RLE. Brought AFO to session.    Patient is accompained by:  Family member    Pertinent History  HTN    Limitations  Walking;Standing    Patient Stated Goals  Get back to how life was prior to the stroke.    Currently in Pain?  No/denies                       [facility] Adult PT Treatment/Exercise - 12/09/19 0001      Transfers   Transfers  Sit to Stand;Stand to Sit    Sit to Stand  5: Supervision    Sit to Stand Details  Tactile cues for weight shifting;Tactile cues for posture;Verbal cues for technique;Manual facilitation for weight shifting    Stand to Sit  5: Supervision    Comments  Completed x 10 reps of sit <> stand w/o UE support with feet level.  Completed x 10 reps w/o UE with RLE placed posterior to LLE to further promote strengthening. Pt requiring verbal cues      Ambulation/Gait   Ambulation/Gait  Yes    Ambulation/Gait Assistance  4: Min guard    Ambulation Distance (Feet)  200 Feet   115 w/o AD   Assistive device  Rolling walker    Gait Pattern  Decreased arm swing - left;Decreased arm swing - right;Decreased step length - right;Decreased step length - left;Decreased stance time - right;Decreased hip/knee flexion - right;Decreased dorsiflexion - right;Decreased weight shift to right;Poor foot clearance - right    Ambulation Surface  Level;Indoor    Gait Comments  Completed gait training x 200 ft with R AFO donned, with RW with R hand attachment. Patient requring verbal cues and tactile cues for RLE foot placement, and verbal cues for improved hip/knee flexion. Followed up with gait training x 115 ft w/o AD and Min Guard from PT. Requiring increased assistance due to instability and one instance of LOB laterally due to narrow BOS.  Exercises   Exercises  Knee/Hip;Other Exercises    Other Exercises   Completed toe raises in supine x 10 reps, patient demonstrating decreaed motion but PT able to feel contraction within muscle belly.       Knee/Hip Exercises: Stretches   Passive Hamstring Stretch  2 reps;30 seconds;Right    Passive Hamstring Stretch Limitations  verbal cues for form, including upright posture and using LUE to provide pressure to keep RLE straight during completion    Gastroc Stretch  Right;2 reps;30 seconds    Gastroc Stretch Limitations  completed manually by PT due to patient demos diffculty completing with RUE spasticity      Knee/Hip Exercises: Supine   Bridges  Both;Strengthening;2 sets;10 reps    Bridges Limitations  verbal cues to include 2-3 sec hold      Knee/Hip Exercises: Sidelying   Clams  completed 1 x 10 reps with RLE, completed in L sidelying. Pt requiring cues to avoid rotation of trunk with  completion.              PT Education - 12/09/19 0934    Education Details  Patient educated on Initial HEP    Person(s) Educated  Patient;Spouse    Methods  Explanation;Demonstration;Other (comment)    Comprehension  Verbalized understanding       PT Short Term Goals - 11/26/19 1032      PT SHORT TERM GOAL #1   Title  Patient will be independent with initial HEP    Baseline  No HEP initiated at this time    Time  3    Period  Weeks    Status  New    Target Date  12/24/19      PT SHORT TERM GOAL #2   Title  Berg Balance will be assessed and LTG written based upon results    Baseline  Not assessed at this time    Time  3    Period  Weeks    Status  New    Target Date  12/24/19      PT SHORT TERM GOAL #3   Title  Patient will decreased TUG from 39.69 seconds to 32 seconds with SPC and CGA    Baseline  39.69 seconds    Time  3    Period  Weeks    Status  New    Target Date  12/24/19        PT Long Term Goals - 11/26/19 1043      PT LONG TERM GOAL #1   Title  Patient will be independent with final HEP for maintaining functional gains and self management    Baseline  No HEP at this time    Time  7    Period  Weeks    Status  New    Target Date  01/21/20      PT LONG TERM GOAL #2   Title  Patient will improve Berg Balance Score by 10 points from initial score to demonstrate improved balance and reduce risk of falls    Baseline  Not assessed at this time    Time  7    Period  Weeks    Status  New    Target Date  01/21/20      PT LONG TERM GOAL #3   Title  Patient will demonstrate ability to ascend/descend 14 steps w/ single rail on R with supv for improved safety and mobility within the home    Time  7    Period  Weeks    Status  New    Target Date  01/21/20      PT LONG TERM GOAL #4   Title  Patient will improve TUG to <25 seconds with SPC and Supv for decreased risk of falls    Baseline  39.69 secs    Time  7    Period  Weeks    Status  New     Target Date  01/21/20      PT LONG TERM GOAL #5   Title  Patient will improve 5 sit <>stand to <15 seconds for improve BLE strength and reduce risk for falls    Baseline  22.92 secs    Time  7    Period  Weeks    Status  New    Target Date  01/21/20            Plan - 12/09/19 0947    Clinical Impression Statement  Today's skilled PT session included gait training with and without AD. Patient ambulating with R AFO donned and with RW initially. Patient demonstrating improved foot clearance with AFO donned in comparison to gait w/o. R Hand grip attachment for RW is ordered, per OT. Also initiated initial HEP focused on lower extremity strengthening and stretching. Pt will continue to benefit from skilled PT services to address deficits and make progress toward goals.    Personal Factors and Comorbidities  Time since onset of injury/illness/exacerbation;Transportation;Comorbidity 1    Comorbidities  HTN    Examination-Activity Limitations  Transfers;Stairs;Stand;Squat    Examination-Participation Restrictions  Community Activity    Stability/Clinical Decision Making  Evolving/Moderate complexity    Rehab Potential  Fair    PT Frequency  1x / week    PT Duration  3 weeks   followed by 2x/week for 4 weeks   PT Treatment/Interventions  Cryotherapy;Electrical Stimulation;Moist Heat;DME Instruction;Gait training;Stair training;Functional mobility training;Therapeutic activities;Therapeutic exercise;Balance training;Neuromuscular re-education;Patient/family education;Orthotic Fit/Training;Passive range of motion    PT Next Visit Plan  Assess Berg Balance, How is HEP going? Personnel officer. Strengthening    Consulted and Agree with Plan of Care  Patient;Family member/caregiver    Family Member Consulted  Spouse       Patient will benefit from skilled therapeutic intervention in order to improve the following deficits and impairments:  Abnormal gait, Decreased balance, Decreased endurance,  Decreased mobility, Difficulty walking, Impaired sensation, Impaired tone, Decreased range of motion, Decreased activity tolerance, Decreased knowledge of use of DME, Decreased safety awareness, Decreased strength, Pain  Visit Diagnosis: Unsteadiness on feet  Muscle weakness (generalized)  Other abnormalities of gait and mobility  Hemiplegia and hemiparesis following cerebral infarction affecting right dominant side [facility])     Problem List Patient Active Problem List   Diagnosis Date Noted  . Adhesive capsulitis of right shoulder 10/07/2019  . Abnormality of gait 09/17/2019  . Spastic hemiparesis of right dominant side ([place]) 07/02/2019  . History of CVA with residual deficit 06/09/2019  . Spastic hemiparesis ([place])   . Uncontrolled type 2 diabetes mellitus with hyperglycemia ([place])   . Noncompliance   . Benign essential HTN   . Newly diagnosed diabetes ([place])   . Leucocytosis   . Right hemiparesis ([place])   . Basal ganglia stroke ([place]) 03/04/2019  . Stroke ([place]) 02/28/2019  . Essential hypertension 02/28/2019  . Tobacco abuse 02/28/2019  . Hyperglycemia 02/28/2019    Jones Bales, PT, DPT 12/09/2019, 9:51 AM  [facility]  [address] [place], [place], [address] Phone: [phone]   Fax:  [phone]  Name: Casey Petty MRN: [id number] Date of Birth: 09/19/1960

## 2019-12-09 NOTE — Patient Instructions (Signed)
  Flexion (Assistive)    Clasp hands together and raise arms above head, keeping elbows as straight as possible. Do lying down in the morning and at night, do sitting up mid day but start with stretch to floor and small circles clockwise before stretching up. THUMB SIDE UP!! Repeat __10__ times. Do _3___ sessions per day.   SELF ASSISTED: Elbow Extension    Clasp hands together, straighten both elbows. _5__ reps per set, HOLD 10 seconds straight. Do 3 sessions per day.   Supination (Passive)    Keep elbow bent at right angle and held firmly at side. Use other hand to turn forearm until palm faces upward. Hold __10__ seconds. Repeat __5__ times. Do __3__ sessions per day.    Extension (Passive)    Using other hand, lift hand at wrist as far as possible. Hold _10___ seconds. Repeat _5___ times. Do __3__ sessions per day.   SELF ASSISTED: Finger Extension    Use one hand to open fingers on other hand. _5__ reps per set, hold 10 seconds each, _3__ sets per day. Do NOT deviate wrist or hyperextend knuckles   Flexion (Passive)    Sitting upright, slide BOTH forearm forward along table with folded towel underneath (Left hand assisting Rt with thumb side up!), bending from the waist until a stretch is felt. Hold __5__ seconds. Repeat __10__ times. Do __3__ sessions per day.

## 2019-12-09 NOTE — Therapy (Signed)
[facility] [address] [place], [place], [address] Phone: [phone]   Fax:  [phone]  Occupational Therapy Treatment  Patient Details  Name: Casey Petty MRN: [id number] Date of Birth: [birth year] Referring Provider (OT): Dr. Delice Lesch   Encounter Date: 12/09/2019  OT End of Session - 12/09/19 0944    Visit Number  2    Number of Visits  13    Authorization Type  MCD - approved 6 visits    Authorization Time Period  12/09/19 - 01/19/20    Authorization - Visit Number  1    Authorization - Number of Visits  6    OT Start Time  0800    OT Stop Time  0845    OT Time Calculation (min)  45 min    Activity Tolerance  Patient tolerated treatment well    Behavior During Therapy  [facility] for tasks assessed/performed       Past Medical History:  Diagnosis Date  . Hypertension     Past Surgical History:  Procedure Laterality Date  . NO PAST SURGERIES      There were no vitals filed for this visit.  Subjective Assessment - 12/09/19 0805    Pertinent History  Lt BG stroke 02/28/19 w/ residual Rt dominant side hemiparesis, adhesive capsulitis of Rt shoulder, **RECENT botox injections on 11/07/19. PMH: HTN    Currently in Pain?  Yes    Pain Score  8     Pain Location  Shoulder    Pain Orientation  Right    Pain Descriptors / Indicators  Sharp    Pain Onset  More than a month ago    Pain Frequency  Intermittent    Aggravating Factors   malpositioning    Pain Relieving Factors  rest, proper positioning       Pt issued self ROM HEP today for RUE - Pt initially w/ pain 8/10 with improper stretching, however after cues for proper positioning and stretching, pt could tolerate 80-90% full P/ROM in sh flexion without pain! Pt required cues t/o session to perform HEP correctly.  Pt also shown proper positioning for RUE at night when sleeping - pt verbalized understanding.                     OT Education -  12/09/19 [id number]    Education Details  Self assisted HEP for RUE, bed positioning    Person(s) Educated  Patient    Methods  Explanation;Demonstration;Handout;Verbal cues    Comprehension  Verbalized understanding;Returned demonstration;Verbal cues required       OT Short Term Goals - 12/09/19 0946      OT SHORT TERM GOAL #1   Title  Independent with HEP for RUE    Baseline  Dependent, not yet issued    Time  3    Period  Weeks    Status  On-going      OT SHORT TERM GOAL #2   Title  Independent with resting hand splint for pm wear    Baseline  dependent, not yet issued    Time  3    Period  Weeks    Status  New      OT SHORT TERM GOAL #3   Title  Pt to verbalize understanding with proper positioning of RUE during stretching HEP and at night to reduce pain Rt shoulder    Baseline  dependent, not yet issued    Time  3  Period  Weeks    Status  New        OT Long Term Goals - 11/26/19 0921      OT LONG TERM GOAL #1   Title  Pt to verbalize understanding of AE needs to increase independence and safety with ADLS (including walker splint, shoe buttons, one handed cutting board)    Baseline  Dependent    Time  6    Period  Weeks    Status  New      OT LONG TERM GOAL #2   Title  Pt to report shoulder pain less than or equal to 5/10 with P/ROM to 90*    Baseline  up to 10/10    Time  6    Period  Weeks    Status  New      OT LONG TERM GOAL #3   Title  Pt to use Rt hand as stabalizer for simple bilateral tasks consistently    Baseline  inconsistent    Time  6    Period  Weeks    Status  New            Plan - 12/09/19 0946    Clinical Impression Statement  Pt progressing with P/ROM Rt shoulder after stretches/HEP - pt able to self stretch above head w/o pain at end of session    Occupational performance deficits (Please refer to evaluation for details):  ADL's;IADL's;Rest and Sleep;Social Participation    Body Structure / Function / Physical Skills   ADL;ROM;Balance;IADL;Improper spinal/pelvic alignment;Body mechanics;Sensation;Mobility;Flexibility;Strength;Coordination;Tone;UE functional use;Pain;Decreased knowledge of use of DME;Proprioception;FMC    Rehab Potential  Good    Comorbidities impacting occupational performance description:  Adhesive capsulitis Rt shoulder    OT Frequency  2x / week    OT Duration  6 weeks   however MCD only approved 6 visits   OT Treatment/Interventions  Self-care/ADL training;Therapeutic exercise;Functional Mobility Training;Aquatic Therapy;Ultrasound;Neuromuscular education;Manual Therapy;Splinting;Therapeutic activities;Coping strategies training;DME and/or AE instruction;Cognitive remediation/compensation;Electrical Stimulation;Moist Heat;Passive range of motion;Patient/family education    Plan  fabricated resting hand splint, review HEP if time allows, also discuss MCD visit limit!    Consulted and Agree with Plan of Care  Patient;Family member/caregiver    Family Member [employer]       Patient will benefit from skilled therapeutic intervention in order to improve the following deficits and impairments:   Body Structure / Function / Physical Skills: ADL, ROM, Balance, IADL, Improper spinal/pelvic alignment, Body mechanics, Sensation, Mobility, Flexibility, Strength, Coordination, Tone, UE functional use, Pain, Decreased knowledge of use of DME, Proprioception, [place]       Visit Diagnosis: Spastic hemiparesis of right dominant side as late effect of cerebral infarction (HCC)  Chronic right shoulder pain  Unsteadiness on feet    Problem List Patient Active Problem List   Diagnosis Date Noted  . Adhesive capsulitis of right shoulder 10/07/2019  . Abnormality of gait 09/17/2019  . Spastic hemiparesis of right dominant side ([place]) 07/02/2019  . History of CVA with residual deficit 06/09/2019  . Spastic hemiparesis ([place])   . Uncontrolled type 2 diabetes mellitus with hyperglycemia ([place])    . Noncompliance   . Benign essential HTN   . Newly diagnosed diabetes ([place])   . Leucocytosis   . Right hemiparesis ([place])   . Basal ganglia stroke ([place]) 03/04/2019  . Stroke ([place]) 02/28/2019  . Essential hypertension 02/28/2019  . Tobacco abuse 02/28/2019  . Hyperglycemia 02/28/2019    Carey Bullocks, OTR/L 12/09/2019, 9:48 AM  [facility] [address] [place], [place], [address] Phone: [phone]   Fax:  [phone]  Name: Casey Petty MRN: [id number] Date of Birth: Sep 14, 1960

## 2019-12-10 DIAGNOSIS — I6930 Unspecified sequelae of cerebral infarction: Secondary | ICD-10-CM | POA: Diagnosis not present

## 2019-12-11 ENCOUNTER — Ambulatory Visit: Payer: Medicaid Other | Admitting: Occupational Therapy

## 2019-12-11 ENCOUNTER — Other Ambulatory Visit: Payer: Self-pay

## 2019-12-11 DIAGNOSIS — G8929 Other chronic pain: Secondary | ICD-10-CM | POA: Diagnosis not present

## 2019-12-11 DIAGNOSIS — I6930 Unspecified sequelae of cerebral infarction: Secondary | ICD-10-CM | POA: Diagnosis not present

## 2019-12-11 DIAGNOSIS — M25511 Pain in right shoulder: Secondary | ICD-10-CM | POA: Diagnosis not present

## 2019-12-11 DIAGNOSIS — M6281 Muscle weakness (generalized): Secondary | ICD-10-CM | POA: Diagnosis not present

## 2019-12-11 DIAGNOSIS — R2681 Unsteadiness on feet: Secondary | ICD-10-CM | POA: Diagnosis not present

## 2019-12-11 DIAGNOSIS — I69351 Hemiplegia and hemiparesis following cerebral infarction affecting right dominant side: Secondary | ICD-10-CM

## 2019-12-11 DIAGNOSIS — R2689 Other abnormalities of gait and mobility: Secondary | ICD-10-CM | POA: Diagnosis not present

## 2019-12-11 NOTE — Patient Instructions (Signed)
  Your Splint This splint should initially be fitted by a healthcare practitioner.  The healthcare practitioner is responsible for providing wearing instructions and precautions to the patient, other healthcare practitioners and care provider involved in the patient's care.  This splint was custom made for you. Please read the following instructions to learn about wearing and caring for your splint.  Precautions Should your splint cause any of the following problems, remove the splint immediately and contact your therapist/physician.  Swelling  Severe Pain  Pressure Areas  Stiffness  Numbness  Do not wear your splint while operating machinery unless it has been fabricated for that purpose.  When To Wear Your Splint Where your splint according to your therapist/physician instructions. Nights and rest periods only, however build up tolerance during the day first (over next 2 days) before switching to night time  Care and Cleaning of Your Splint 1. Keep your splint away from open flames. 2. Your splint will lose its shape in temperatures over 135 degrees Farenheit, ( in car windows, near radiators, ovens or in hot water).  Never make any adjustments to your splint, if the splint needs adjusting remove it and make an appointment to see your therapist. 3. Your splint may be cleaned with rubbing alcohol.  Do not immerse in hot water over 135 degrees Farenheit.

## 2019-12-11 NOTE — Therapy (Addendum)
[facility] [address] [place], [place], [address] Phone: [phone]   Fax:  [phone]  Occupational Therapy Treatment  Patient Details  Name: Casey Petty MRN: [id number] Date of Birth: 02-May-1961 Referring Provider (OT): Dr. Delice Lesch   Encounter Date: 12/11/2019  OT End of Session - 12/11/19 1006    Visit Number  3    Number of Visits  13    Authorization Type  MCD - approved 6 visits    Authorization Time Period  12/09/19 - 01/19/20    Authorization - Visit Number  2    Authorization - Number of Visits  6    OT Start Time  0800    OT Stop Time  0845    OT Time Calculation (min)  45 min    Activity Tolerance  Patient tolerated treatment well    Behavior During Therapy  [facility] for tasks assessed/performed       Past Medical History:  Diagnosis Date  . Hypertension     Past Surgical History:  Procedure Laterality Date  . NO PAST SURGERIES      There were no vitals filed for this visit.  Subjective Assessment - 12/11/19 0803    Patient is accompanied by:  Family member   fiancee   Pertinent History  Lt BG stroke 02/28/19 w/ residual Rt dominant side hemiparesis, adhesive capsulitis of Rt shoulder, **RECENT botox injections on 11/07/19. PMH: HTN    Currently in Pain?  No/denies       Fabricated and fitted resting hand splint for Rt hand and issued. Reviewed wear and care including proper donning/doffing with patient/fiancee.   Different strapping applied to wrist for better fit.                     OT Education - 12/11/19 0847    Education Details  splint wear and care    Person(s) Educated  Patient;Spouse    Methods  Explanation;Demonstration;Handout;Verbal cues    Comprehension  Verbalized understanding;Verbal cues required       OT Short Term Goals - 12/11/19 1006      OT SHORT TERM GOAL #1   Title  Independent with HEP for RUE    Baseline  Dependent, not yet issued    Time   3    Period  Weeks    Status  On-going      OT SHORT TERM GOAL #2   Title  Independent with resting hand splint for pm wear    Baseline  dependent, not yet issued    Time  3    Period  Weeks    Status  On-going      OT SHORT TERM GOAL #3   Title  Pt to verbalize understanding with proper positioning of RUE during stretching HEP and at night to reduce pain Rt shoulder    Baseline  dependent, not yet issued    Time  3    Period  Weeks    Status  New        OT Long Term Goals - 11/26/19 [id number]      OT LONG TERM GOAL #1   Title  Pt to verbalize understanding of AE needs to increase independence and safety with ADLS (including walker splint, shoe buttons, one handed cutting board)    Baseline  Dependent    Time  6    Period  Weeks    Status  New  OT LONG TERM GOAL #2   Title  Pt to report shoulder pain less than or equal to 5/10 with P/ROM to 90*    Baseline  up to 10/10    Time  6    Period  Weeks    Status  New      OT LONG TERM GOAL #3   Title  Pt to use Rt hand as stabalizer for simple bilateral tasks consistently    Baseline  inconsistent    Time  6    Period  Weeks    Status  New            Plan - 12/11/19 1007    Clinical Impression Statement  Pt approximating STG's #1 and #2    Occupational performance deficits (Please refer to evaluation for details):  ADL's;IADL's;Rest and Sleep;Social Participation    Body Structure / Function / Physical Skills  ADL;ROM;Balance;IADL;Improper spinal/pelvic alignment;Body mechanics;Sensation;Mobility;Flexibility;Strength;Coordination;Tone;UE functional use;Pain;Decreased knowledge of use of DME;Proprioception;FMC    Rehab Potential  Good    Comorbidities impacting occupational performance description:  Adhesive capsulitis Rt shoulder    OT Frequency  2x / week    OT Duration  6 weeks   however MCD only approved 6 visits   OT Treatment/Interventions  Self-care/ADL training;Therapeutic exercise;Functional Mobility  Training;Aquatic Therapy;Ultrasound;Neuromuscular education;Manual Therapy;Splinting;Therapeutic activities;Coping strategies training;DME and/or AE instruction;Cognitive remediation/compensation;Electrical Stimulation;Moist Heat;Passive range of motion;Patient/family education    Plan  review HEP, adjust splint prn, introduce A/E to increase ease, independence and safety w/ ADLS    Consulted and Agree with Plan of Care  Patient;Family member/caregiver    Family Member [employer]       Patient will benefit from skilled therapeutic intervention in order to improve the following deficits and impairments:   Body Structure / Function / Physical Skills: ADL, ROM, Balance, IADL, Improper spinal/pelvic alignment, Body mechanics, Sensation, Mobility, Flexibility, Strength, Coordination, Tone, UE functional use, Pain, Decreased knowledge of use of DME, Proprioception, [place]       Visit Diagnosis: Hemiplegia and hemiparesis following cerebral infarction affecting right dominant side [facility])    Problem List Patient Active Problem List   Diagnosis Date Noted  . Adhesive capsulitis of right shoulder 10/07/2019  . Abnormality of gait 09/17/2019  . Spastic hemiparesis of right dominant side ([place]) 07/02/2019  . History of CVA with residual deficit 06/09/2019  . Spastic hemiparesis ([place])   . Uncontrolled type 2 diabetes mellitus with hyperglycemia ([place])   . Noncompliance   . Benign essential HTN   . Newly diagnosed diabetes ([place])   . Leucocytosis   . Right hemiparesis ([place])   . Basal ganglia stroke ([place]) 03/04/2019  . Stroke ([place]) 02/28/2019  . Essential hypertension 02/28/2019  . Tobacco abuse 02/28/2019  . Hyperglycemia 02/28/2019    Carey Bullocks, OTR/L 12/11/2019, 10:08 AM  [facility] [address] [place], [place], [address] Phone: [phone]   Fax:  [phone]  Name: Skylier Kestler MRN: [id number] Date of  Birth: 08-Feb-1961

## 2019-12-12 DIAGNOSIS — I6930 Unspecified sequelae of cerebral infarction: Secondary | ICD-10-CM | POA: Diagnosis not present

## 2019-12-13 DIAGNOSIS — I6930 Unspecified sequelae of cerebral infarction: Secondary | ICD-10-CM | POA: Diagnosis not present

## 2019-12-14 DIAGNOSIS — I6930 Unspecified sequelae of cerebral infarction: Secondary | ICD-10-CM | POA: Diagnosis not present

## 2019-12-16 ENCOUNTER — Ambulatory Visit: Payer: Medicaid Other

## 2019-12-16 ENCOUNTER — Other Ambulatory Visit: Payer: Self-pay

## 2019-12-16 ENCOUNTER — Ambulatory Visit: Payer: Medicaid Other | Admitting: Occupational Therapy

## 2019-12-16 DIAGNOSIS — I69351 Hemiplegia and hemiparesis following cerebral infarction affecting right dominant side: Secondary | ICD-10-CM | POA: Diagnosis not present

## 2019-12-16 DIAGNOSIS — R2681 Unsteadiness on feet: Secondary | ICD-10-CM

## 2019-12-16 DIAGNOSIS — M25511 Pain in right shoulder: Secondary | ICD-10-CM | POA: Diagnosis not present

## 2019-12-16 DIAGNOSIS — R2689 Other abnormalities of gait and mobility: Secondary | ICD-10-CM

## 2019-12-16 DIAGNOSIS — G8929 Other chronic pain: Secondary | ICD-10-CM | POA: Diagnosis not present

## 2019-12-16 DIAGNOSIS — M6281 Muscle weakness (generalized): Secondary | ICD-10-CM | POA: Diagnosis not present

## 2019-12-16 DIAGNOSIS — I6930 Unspecified sequelae of cerebral infarction: Secondary | ICD-10-CM | POA: Diagnosis not present

## 2019-12-16 NOTE — Therapy (Signed)
[facility] [address] [place], [place], [address] Phone: [phone]   Fax:  [phone]  Physical Therapy Treatment  Patient Details  Name: Casey Petty MRN: [id number] Date of Birth: Jan 13, 1961 Referring Provider (PT): Ankit Lorie Phenix, MD   Encounter Date: 12/16/2019  PT End of Session - 12/16/19 1019    Visit Number  3    Number of Visits  4    Date for PT Re-Evaluation  02/24/20   POC for 7 weeks, Cert for 90 days   Authorization Type  Medicaid    Authorization Time Period  3 visits 5/03 - 5/23    Authorization - Visit Number  2    Authorization - Number of Visits  3    PT Start Time  1016    PT Stop Time  1057    PT Time Calculation (min)  41 min    Equipment Utilized During Treatment  Gait belt    Activity Tolerance  Patient tolerated treatment well    Behavior During Therapy  [facility] for tasks assessed/performed       Past Medical History:  Diagnosis Date  . Hypertension     Past Surgical History:  Procedure Laterality Date  . NO PAST SURGERIES      There were no vitals filed for this visit.  Subjective Assessment - 12/16/19 1017    Subjective  Patient reports doing well, no new complaints since last session. Reports HEP is going well, no issues reported.    Patient is accompained by:  Family member    Pertinent History  HTN    Limitations  Walking;Standing    Patient Stated Goals  Get back to how life was prior to the stroke.    Currently in Pain?  No/denies                       [facility] Adult PT Treatment/Exercise - 12/16/19 1028      Transfers   Transfers  Sit to Stand;Stand to Sit    Sit to Stand  5: Supervision    Sit to Stand Details  Tactile cues for weight shifting;Tactile cues for posture;Verbal cues for technique;Manual facilitation for weight shifting    Stand to Sit  5: Supervision    Stand to Sit Details (indicate cue type and reason)  Tactile cues for weight  shifting;Verbal cues for precautions/safety    Comments  completed sit <> stands, 2 x 10 reps. with 2" step placed under LLE to promote improved weight shift and strengthening on RLE      Ambulation/Gait   Ambulation/Gait  Yes    Ambulation/Gait Assistance  4: Min guard    Ambulation Distance (Feet)  300 Feet   150 x 2   Assistive device  Rolling walker    Gait Pattern  Decreased arm swing - left;Decreased arm swing - right;Decreased step length - right;Decreased step length - left;Decreased stance time - right;Decreased hip/knee flexion - right;Decreased dorsiflexion - right;Decreased weight shift to right;Poor foot clearance - right    Ambulation Surface  Level;Indoor    Gait Comments  Completed gait training 150 ft x 2 with personal R AFO donned, RW and R hand attachment. Verbal cues for foot placement and improved hip/knee flexion. Pt demo good management of R UE with R hand attachment.       Standardized Balance Assessment   Standardized Balance Assessment  Berg Balance Test      [place] Balance  Test   Sit to Stand  Able to stand  independently using hands    Standing Unsupported  Able to stand 2 minutes with supervision    Sitting with Back Unsupported but Feet Supported on Floor or Stool  Able to sit safely and securely 2 minutes    Stand to Sit  Controls descent by using hands    Transfers  Able to transfer safely, definite need of hands    Standing Unsupported with Eyes Closed  Able to stand 10 seconds with supervision    Standing Ubsupported with Feet Together  Able to place feet together independently and stand for 1 minute with supervision    From Standing, Reach Forward with Outstretched Arm  Can reach forward >12 cm safely (5")    From Standing Position, Pick up Object from [place] to pick up shoe, needs supervision    From Standing Position, Turn to Look Behind Over each Shoulder  Looks behind one side only/other side shows less weight shift    Turn 360 Degrees  Needs close  supervision or verbal cueing    Standing Unsupported, Alternately Place Feet on Step/Stool  Able to complete >2 steps/needs minimal assist    Standing Unsupported, One Foot in Front  Able to take small step independently and hold 30 seconds    Standing on One Leg  Able to lift leg independently and hold equal to or more than 3 seconds    Total Score  37      Neuro Re-ed    Neuro Re-ed Details   Completed standing R/L toe taps to target, 2 x 10 reps each LE. verbal cues focused on improved weight shift to RLE.  Verbal cues for slowing pace and improved weight shift and alignment.,                PT Short Term Goals - 12/16/19 1227      PT SHORT TERM GOAL #1   Title  Patient will be independent with initial HEP    Baseline  HEP Initiated    Time  3    Period  Weeks    Status  On-going    Target Date  12/24/19      PT SHORT TERM GOAL #2   Title  Berg Balance will be assessed and LTG written based upon results    Baseline  Berg Balance assessed 5/10    Time  3    Period  Weeks    Status  Achieved    Target Date  12/24/19      PT SHORT TERM GOAL #3   Title  Patient will decreased TUG from 39.69 seconds to 32 seconds with SPC and CGA    Baseline  39.69 seconds    Time  3    Period  Weeks    Status  New    Target Date  12/24/19        PT Long Term Goals - 12/16/19 1233      PT LONG TERM GOAL #1   Title  Patient will be independent with final HEP for maintaining functional gains and self management    Baseline  No HEP at this time    Time  7    Period  Weeks    Status  New      PT LONG TERM GOAL #2   Title  Patient will improve Berg Balance Score from 37/56 to >/= 47/56 to demonstrate improved balance and reduce risk  of falls    Baseline  37/56 on 5/10    Time  7    Period  Weeks    Status  New      PT LONG TERM GOAL #3   Title  Patient will demonstrate ability to ascend/descend 14 steps w/ single rail on R with supv for improved safety and mobility within the  home    Time  7    Period  Weeks    Status  New      PT LONG TERM GOAL #4   Title  Patient will improve TUG to <25 seconds with SPC and Supv for decreased risk of falls    Baseline  39.69 secs    Time  7    Period  Weeks    Status  New      PT LONG TERM GOAL #5   Title  Patient will improve 5 sit <>stand to <15 seconds for improve BLE strength and reduce risk for falls    Baseline  22.92 secs    Time  7    Period  Weeks    Status  New            Plan - 12/16/19 1230    Clinical Impression Statement  Continued gait training in today's session with personal R AFO donned, RW, and R hand grip attachament. Assessed Berg Balance and patient scored [id number] demonstrating high risk for falls. Completed balance and strength training foucsed on improved weight shift to R and strenghtening of RLE. Pt will continue to benefit from skilled PT to make progress toward all unment goals.    Personal Factors and Comorbidities  Time since onset of injury/illness/exacerbation;Transportation;Comorbidity 1    Comorbidities  HTN    Examination-Activity Limitations  Transfers;Stairs;Stand;Squat    Examination-Participation Restrictions  Community Activity    Stability/Clinical Decision Making  Evolving/Moderate complexity    Rehab Potential  Fair    PT Frequency  1x / week    PT Duration  3 weeks   followed by 2x/week for 4 weeks   PT Treatment/Interventions  Cryotherapy;Electrical Stimulation;Moist Heat;DME Instruction;Gait training;Stair training;Functional mobility training;Therapeutic activities;Therapeutic exercise;Balance training;Neuromuscular re-education;Patient/family education;Orthotic Fit/Training;Passive range of motion    PT Next Visit Plan  Gait Training. Strengthening Exercises, Check STG and resubmit for Medicaid    Consulted and Agree with Plan of Care  Patient;Family member/caregiver    Family Member Consulted  Spouse       Patient will benefit from skilled therapeutic  intervention in order to improve the following deficits and impairments:  Abnormal gait, Decreased balance, Decreased endurance, Decreased mobility, Difficulty walking, Impaired sensation, Impaired tone, Decreased range of motion, Decreased activity tolerance, Decreased knowledge of use of DME, Decreased safety awareness, Decreased strength, Pain  Visit Diagnosis: Hemiplegia and hemiparesis following cerebral infarction affecting right dominant side (HCC)  Muscle weakness (generalized)  Unsteadiness on feet  Other abnormalities of gait and mobility     Problem List Patient Active Problem List   Diagnosis Date Noted  . Adhesive capsulitis of right shoulder 10/07/2019  . Abnormality of gait 09/17/2019  . Spastic hemiparesis of right dominant side ([place]) 07/02/2019  . History of CVA with residual deficit 06/09/2019  . Spastic hemiparesis ([place])   . Uncontrolled type 2 diabetes mellitus with hyperglycemia ([place])   . Noncompliance   . Benign essential HTN   . Newly diagnosed diabetes ([place])   . Leucocytosis   . Right hemiparesis ([place])   . Basal ganglia stroke ([place]) 03/04/2019  .  Stroke ([place]) 02/28/2019  . Essential hypertension 02/28/2019  . Tobacco abuse 02/28/2019  . Hyperglycemia 02/28/2019    Jones Bales, PT, DPT 12/16/2019, 12:35 PM  [facility] [address] [place], [place], [address] Phone: [phone]   Fax:  [phone]  Name: Clancey Rudnik MRN: [id number] Date of Birth: 04/19/1961

## 2019-12-16 NOTE — Therapy (Signed)
[facility] [address] [place], , [address] Phone: [phone]   Fax:  [phone]  Occupational Therapy Treatment  Patient Details  Name: Casey Petty MRN: [id number] Date of Birth: 04/01/1961 Referring Provider (OT): Dr. Ankit Patel   Encounter Date: 12/16/2019  OT End of Session - 12/16/19 1012    Visit Number  4    Number of Visits  13    Authorization Type  MCD - approved 6 visits    Authorization Time Period  12/09/19 - 01/19/20    Authorization - Visit Number  3    Authorization - Number of Visits  6    OT Start Time  0845    OT Stop Time  0930    OT Time Calculation (min)  45 min    Activity Tolerance  Patient tolerated treatment well    Behavior During Therapy  WFL for tasks assessed/performed       Past Medical History:  Diagnosis Date  . Hypertension     Past Surgical History:  Procedure Laterality Date  . NO PAST SURGERIES      There were no vitals filed for this visit.  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 PMH: HTN    Currently in Pain?  No/denies        Pt shown A/E and how to use including shoe buttons, rocker knife, and one handed cutting board. Pt not interested in shoe buttons due to current shoes not needed. Pt provided handout on rocker knife and one handed cooking kit.  Reviewed stretching HEP and proper positioning for NMR RUE - Pt still required cues for proper positioning                      OT Short Term Goals - 12/16/19 1013      OT SHORT TERM GOAL #1   Title  Independent with HEP for RUE    Baseline  Dependent, not yet issued    Time  3    Period  Weeks    Status  Achieved      OT SHORT TERM GOAL #2    Title  Independent with resting hand splint for pm wear    Baseline  dependent, not yet issued    Time  3    Period  Weeks    Status  Achieved      OT SHORT TERM GOAL #3   Title  Pt to verbalize understanding with proper positioning of RUE during stretching HEP and at night to reduce pain Rt shoulder    Baseline  dependent, not yet issued    Time  3    Period  Weeks    Status  On-going        OT Long Term Goals - 12/16/19 1013      OT LONG TERM GOAL #1   Title  Pt to verbalize understanding of AE needs to increase independence and safety with ADLS (including walker splint, shoe buttons, one handed cutting board)    Baseline  Dependent    Time  6    Period  Weeks    Status  Achieved      OT LONG TERM GOAL #  2   Title  Pt to report shoulder pain less than or equal to 5/10 with P/ROM to 90*    Baseline  up to 10/10    Time  6    Period  Weeks    Status  On-going      OT LONG TERM GOAL #3   Title  Pt to use Rt hand as stabalizer for simple bilateral tasks consistently    Baseline  inconsistent    Time  6    Period  Weeks    Status  New            Plan - 12/16/19 1014    Clinical Impression Statement  Pt has met 2 STG's and 1 LTG at this time    Occupational performance deficits (Please refer to evaluation for details):  ADL's;IADL's;Rest and Sleep;Social Participation    Body Structure / Function / Physical Skills  ADL;ROM;Balance;IADL;Improper spinal/pelvic alignment;Body mechanics;Sensation;Mobility;Flexibility;Strength;Coordination;Tone;UE functional use;Pain;Decreased knowledge of use of DME;Proprioception;FMC    Rehab Potential  Good    Comorbidities impacting occupational performance description:  Adhesive capsulitis Rt shoulder    OT Frequency  2x / week    OT Duration  6 weeks   MCD only approved 6 visits   OT Treatment/Interventions  Self-care/ADL training;Therapeutic exercise;Functional Mobility Training;Aquatic Therapy;Ultrasound;Neuromuscular  education;Manual Therapy;Splinting;Therapeutic activities;Coping strategies training;DME and/or AE instruction;Cognitive remediation/compensation;Electrical Stimulation;Moist Heat;Passive range of motion;Patient/family education    Plan  continue NMR and spasticity management for RUE    Family Member Consulted  Fiancee       Patient will benefit from skilled therapeutic intervention in order to improve the following deficits and impairments:   Body Structure / Function / Physical Skills: ADL, ROM, Balance, IADL, Improper spinal/pelvic alignment, Body mechanics, Sensation, Mobility, Flexibility, Strength, Coordination, Tone, UE functional use, Pain, Decreased knowledge of use of DME, Proprioception, FMC       Visit Diagnosis: Hemiplegia and hemiparesis following cerebral infarction affecting right dominant side (HCC)  Muscle weakness (generalized)  Unsteadiness on feet    Problem List Patient Active Problem List   Diagnosis Date Noted  . Adhesive capsulitis of right shoulder 10/07/2019  . Abnormality of gait 09/17/2019  . Spastic hemiparesis of right dominant side (HCC) 07/02/2019  . History of CVA with residual deficit 06/09/2019  . Spastic hemiparesis (HCC)   . Uncontrolled type 2 diabetes mellitus with hyperglycemia (HCC)   . Noncompliance   . Benign essential HTN   . Newly diagnosed diabetes (HCC)   . Leucocytosis   . Right hemiparesis (HCC)   . Basal ganglia stroke (HCC) 03/04/2019  . Stroke (HCC) 02/28/2019  . Essential hypertension 02/28/2019  . Tobacco abuse 02/28/2019  . Hyperglycemia 02/28/2019    Ballie, Kelly Johnson, OTR/L 12/16/2019, 12:15 PM  [facility] [address] [place], [place], [address] Phone: [phone]   Fax:  [phone]  Name: Ricky Witherington MRN: [id number] Date of Birth: 04/10/1961 

## 2019-12-17 DIAGNOSIS — I6930 Unspecified sequelae of cerebral infarction: Secondary | ICD-10-CM | POA: Diagnosis not present

## 2019-12-18 ENCOUNTER — Other Ambulatory Visit: Payer: Self-pay

## 2019-12-18 ENCOUNTER — Ambulatory Visit: Payer: Medicaid Other | Admitting: Occupational Therapy

## 2019-12-18 DIAGNOSIS — I69351 Hemiplegia and hemiparesis following cerebral infarction affecting right dominant side: Secondary | ICD-10-CM

## 2019-12-18 DIAGNOSIS — M6281 Muscle weakness (generalized): Secondary | ICD-10-CM

## 2019-12-18 DIAGNOSIS — M25511 Pain in right shoulder: Secondary | ICD-10-CM | POA: Diagnosis not present

## 2019-12-18 DIAGNOSIS — R2689 Other abnormalities of gait and mobility: Secondary | ICD-10-CM | POA: Diagnosis not present

## 2019-12-18 DIAGNOSIS — R2681 Unsteadiness on feet: Secondary | ICD-10-CM | POA: Diagnosis not present

## 2019-12-18 DIAGNOSIS — G8929 Other chronic pain: Secondary | ICD-10-CM | POA: Diagnosis not present

## 2019-12-18 NOTE — Therapy (Signed)
[facility] [address] [place], [place], [address] Phone: [phone]   Fax:  [phone]  Occupational Therapy Treatment  Patient Details  Name: Casey Petty MRN: [id number] Date of Birth: [birth year] Referring Provider (OT): Dr. Delice Lesch   Encounter Date: 12/18/2019  OT End of Session - 12/18/19 1011    Visit Number  5    Number of Visits  13    Authorization Type  MCD - approved 6 visits    Authorization Time Period  12/09/19 - 01/19/20    Authorization - Visit Number  4    Authorization - Number of Visits  6    OT Start Time  0930    OT Stop Time  1015    OT Time Calculation (min)  45 min    Activity Tolerance  Patient tolerated treatment well    Behavior During Therapy  [facility] for tasks assessed/performed       Past Medical History:  Diagnosis Date  . Hypertension     Past Surgical History:  Procedure Laterality Date  . NO PAST SURGERIES      There were no vitals filed for this visit.  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 PMH: HTN    Currently in Pain?  No/denies        Reviewed proper stretching in shoulder flexion first into gravity, then against gravity with proper shoulder positioning. Wt bearing over Rt elbow while reaching LUE to activate Rt sh girdle and for scapula depression, followed by full arm wt bearing while reaching across body with LUE.  Prone: bilateral scapula retraction with min tactile and verbal cues, followed by wt bearing over elbows lifting chest up for scapula depression.  NMES to wrist/finger extensors x 15 min. 50 pps, 250 pw, 10 sec. On/off cycle with better response to fingers than wrist, but still achieved min wrist ext.                      OT Short  Term Goals - 12/16/19 1013      OT SHORT TERM GOAL #1   Title  Independent with HEP for RUE    Baseline  Dependent, not yet issued    Time  3    Period  Weeks    Status  Achieved      OT SHORT TERM GOAL #2   Title  Independent with resting hand splint for pm wear    Baseline  dependent, not yet issued    Time  3    Period  Weeks    Status  Achieved      OT SHORT TERM GOAL #3   Title  Pt to verbalize understanding with proper positioning of RUE during stretching HEP and at night to reduce pain Rt shoulder    Baseline  dependent, not yet issued    Time  3    Period  Weeks    Status  On-going        OT Long Term Goals - 12/16/19 1013      OT LONG TERM GOAL #1   Title  Pt to verbalize understanding of AE needs to increase independence and safety with ADLS (including walker splint, shoe buttons, one handed cutting board)    Baseline  Dependent    Time  6    Period  Weeks    Status  Achieved      OT LONG TERM GOAL #2   Title  Pt to report shoulder pain less than or equal to 5/10 with P/ROM to 90*    Baseline  up to 10/10    Time  6    Period  Weeks    Status  On-going      OT LONG TERM GOAL #3   Title  Pt to use Rt hand as stabalizer for simple bilateral tasks consistently    Baseline  inconsistent    Time  6    Period  Weeks    Status  New            Plan - 12/18/19 1011    Clinical Impression Statement  Pt continues to need cues for proper positioning with stretching. Pt responds well to estim    Occupational performance deficits (Please refer to evaluation for details):  ADL's;IADL's;Rest and Sleep;Social Participation    Body Structure / Function / Physical Skills  ADL;ROM;Balance;IADL;Improper spinal/pelvic alignment;Body mechanics;Sensation;Mobility;Flexibility;Strength;Coordination;Tone;UE functional use;Pain;Decreased knowledge of use of DME;Proprioception;FMC    Rehab Potential  Good    OT Frequency  2x / week    OT Duration  6 weeks   MCD approved  only 6 visits   OT Treatment/Interventions  Self-care/ADL training;Therapeutic exercise;Functional Mobility Training;Aquatic Therapy;Ultrasound;Neuromuscular education;Manual Therapy;Splinting;Therapeutic activities;Coping strategies training;DME and/or AE instruction;Cognitive remediation/compensation;Electrical Stimulation;Moist Heat;Passive range of motion;Patient/family education    Plan  continue NMR and spasticity management for RUE    Consulted and Agree with Plan of Care  Patient;Family member/caregiver    Family Member [employer]       Patient will benefit from skilled therapeutic intervention in order to improve the following deficits and impairments:   Body Structure / Function / Physical Skills: ADL, ROM, Balance, IADL, Improper spinal/pelvic alignment, Body mechanics, Sensation, Mobility, Flexibility, Strength, Coordination, Tone, UE functional use, Pain, Decreased knowledge of use of DME, Proprioception, [place]       Visit Diagnosis: Hemiplegia and hemiparesis following cerebral infarction affecting right dominant side (HCC)  Muscle weakness (generalized)    Problem List Patient Active Problem List   Diagnosis Date Noted  . Adhesive capsulitis of right shoulder 10/07/2019  . Abnormality of gait 09/17/2019  . Spastic hemiparesis of right dominant side ([place]) 07/02/2019  . History of CVA with residual deficit 06/09/2019  . Spastic hemiparesis ([place])   . Uncontrolled type 2 diabetes mellitus with hyperglycemia ([place])   . Noncompliance   . Benign essential HTN   . Newly diagnosed diabetes ([place])   . Leucocytosis   . Right hemiparesis ([place])   . Basal ganglia stroke ([place]) 03/04/2019  . Stroke ([place]) 02/28/2019  . Essential hypertension 02/28/2019  . Tobacco abuse 02/28/2019  . Hyperglycemia 02/28/2019    Carey Bullocks, OTR/L 12/18/2019, 10:14 AM  [facility] [address] [place], [place],  [address] Phone: [phone]   Fax:  [phone]  Name: Demaje Cuartas MRN: [id number] Date of Birth: 05-06-1961

## 2019-12-19 ENCOUNTER — Other Ambulatory Visit: Payer: Self-pay

## 2019-12-19 ENCOUNTER — Encounter: Payer: Medicaid Other | Attending: Registered Nurse | Admitting: Physical Medicine & Rehabilitation

## 2019-12-19 ENCOUNTER — Encounter: Payer: Self-pay | Admitting: Physical Medicine & Rehabilitation

## 2019-12-19 VITALS — BP 153/88 | HR 84 | Temp 97.2°F | Ht 67.0 in | Wt 162.0 lb

## 2019-12-19 DIAGNOSIS — R269 Unspecified abnormalities of gait and mobility: Secondary | ICD-10-CM | POA: Diagnosis not present

## 2019-12-19 DIAGNOSIS — G8111 Spastic hemiplegia affecting right dominant side: Secondary | ICD-10-CM

## 2019-12-19 DIAGNOSIS — I6930 Unspecified sequelae of cerebral infarction: Secondary | ICD-10-CM | POA: Diagnosis not present

## 2019-12-19 DIAGNOSIS — M7501 Adhesive capsulitis of right shoulder: Secondary | ICD-10-CM | POA: Diagnosis present

## 2019-12-19 DIAGNOSIS — I639 Cerebral infarction, unspecified: Secondary | ICD-10-CM | POA: Diagnosis present

## 2019-12-19 DIAGNOSIS — G8929 Other chronic pain: Secondary | ICD-10-CM | POA: Diagnosis not present

## 2019-12-19 DIAGNOSIS — I10 Essential (primary) hypertension: Secondary | ICD-10-CM | POA: Diagnosis present

## 2019-12-19 DIAGNOSIS — G8191 Hemiplegia, unspecified affecting right dominant side: Secondary | ICD-10-CM | POA: Diagnosis present

## 2019-12-19 DIAGNOSIS — G8110 Spastic hemiplegia affecting unspecified side: Secondary | ICD-10-CM | POA: Diagnosis not present

## 2019-12-19 DIAGNOSIS — E1165 Type 2 diabetes mellitus with hyperglycemia: Secondary | ICD-10-CM | POA: Diagnosis not present

## 2019-12-19 DIAGNOSIS — I6381 Other cerebral infarction due to occlusion or stenosis of small artery: Secondary | ICD-10-CM

## 2019-12-19 DIAGNOSIS — M25511 Pain in right shoulder: Secondary | ICD-10-CM

## 2019-12-19 MED ORDER — DICLOFENAC SODIUM 1 % EX GEL: 2.0000 g | Freq: Four times a day (QID) | CUTANEOUS | 1 refills | Status: DC

## 2019-12-19 NOTE — Patient Instructions (Signed)
Please follow up with [facility]:   Address: [address] [place],  [address]  Phone: [phone]

## 2019-12-19 NOTE — Progress Notes (Signed)
Subjective:    Patient ID: Casey Petty, male    DOB: 09-25-1960, 59 y.o.   MRN: [id number]  HPI Male with history of HTN--no medicine 3 years presents for follow up for left basal ganglia stroke.  Last clinic visit on 11/07/2019.  Significant other supplements history. He had botulinum toxin injection at that time.  Since that time, patient has been in therapies, notes reviewed-continues to be high fall risk.  Patient states, he tolerated  Injection with benefit.  He notes benefit with therapies as well. Discrepancy with pt and significant other regarding use of Baclofen. BP is high, states it is normally WNL. Denies falls.  He still has not scheduled follow up with Neuro. He is smoking 1 cig/day. He notes benefit with Voltaren gel. States he has been checking his CBGs which have been controlled.   Pain Inventory Average Pain varies Pain Right Now 8 My pain is intermittent and sharp  In the last 24 hours, has pain interfered with the following? General activity 5 Relation with others 4 Enjoyment of life 4 What TIME of day is your pain at its worst? night Sleep (in general) Poor  Pain is worse with: some activites Pain improves with: therapy/exercise, medication and injections Relief from Meds: 4  Mobility use a cane use a walker ability to climb steps?  no do you drive?  no use a wheelchair transfers alone  Function disabled: date disabled . I need assistance with the following:  bathing, meal prep, household duties and shopping  Neuro/Psych tremor tingling trouble walking spasms  Prior Studies Any changes since last visit?  no  Physicians involved in your care Any changes since last visit?  no   Family History  Problem Relation Age of Onset  . Heart attack Father   . Cancer Neg Hx    Social History   Socioeconomic History  . Marital status: Single    Spouse name: Not on file  . Number of children: Not on file  . Years of education: Not on file  .  Highest education level: Not on file  Occupational History  . Not on file  Tobacco Use  . Smoking status: Current Every Day Smoker  . Smokeless tobacco: Never Used  . Tobacco comment: Smoking 4 cigs/day  Substance and Sexual Activity  . Alcohol use: Yes    Alcohol/week: 2.0 standard drinks    Types: 2 Standard drinks or equivalent per week  . Drug use: No  . Sexual activity: Never  Other Topics Concern  . Not on file  Social History Narrative  . Not on file   Social Determinants of Health   Financial Resource Strain:   . Difficulty of Paying Living Expenses:   Food Insecurity:   . Worried About Charity fundraiser in the Last Year:   . Arboriculturist in the Last Year:   Transportation Needs:   . Film/video editor (Medical):   Marland Kitchen Lack of Transportation (Non-Medical):   Physical Activity:   . Days of Exercise per Week:   . Minutes of Exercise per Session:   Stress:   . Feeling of Stress :   Social Connections:   . Frequency of Communication with Friends and Family:   . Frequency of Social Gatherings with Friends and Family:   . Attends Religious Services:   . Active Member of Clubs or Organizations:   . Attends Archivist Meetings:   Marland Kitchen Marital Status:    Past Surgical  History:  Procedure Laterality Date  . NO PAST SURGERIES     Past Medical History:  Diagnosis Date  . Hypertension    BP (!) 153/88   Pulse 84   Temp (!) 97.2 F (36.2 C)   Ht 5\' 7"  (1.702 m)   Wt 162 lb (73.5 kg)   SpO2 98%   BMI 25.37 kg/m   Opioid Risk Score:   Fall Risk Score:  `1  Depression screen PHQ 2/9  Depression screen [facility] 2/9 12/19/2019 06/20/2019 05/01/2019 03/27/2019 06/27/2014  Decreased Interest 0 0 0 0 0  Down, Depressed, Hopeless 0 0 0 0 0  PHQ - 2 Score 0 0 0 0 0   Review of Systems  Constitutional: Negative.   HENT: Negative.   Eyes: Negative.   Respiratory: Negative.   Cardiovascular: Negative.   Gastrointestinal: Negative.   Endocrine: Negative.    Genitourinary: Negative.   Musculoskeletal: Positive for gait problem.       Spasms/ shoulder pain on stroke affected side  Skin: Negative.   Allergic/Immunologic: Negative.   Neurological: Positive for tremors and weakness.  Hematological: Negative.   Psychiatric/Behavioral: Negative.       Objective:   Physical Exam  Constitutional: NAD.  Respiratory: Normal effort.   Musc:  Right shoulder with pain with ER, improved Gait: Wheelchair due to balance deficits Neurologic: Alert Dysarthria, stable Motor:  RUE: Shoulder abduction 3/5 (pain inhibition), elbow flexion 2+/5, elbow extension 4-/5, handgrip 2/5 with apraxia Right lower extremity: Hip flexion, knee extension 4/5, ankle dorsiflexion 0/5 MAS: right shoulder abdcutors 1+/4, elbow flexors 3/4, wrist flexors 3/4, finger flexors 1+/4, ankle dorsiflexors 1+/4    Assessment & Plan:  Male with history of HTN--no medicine 3 years presents for follow up for left basal ganglia stroke.  1. 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   Cont HEP  Increased Baclofen 30 TID, unclear if patient is taking, encouraged complaince  Follow up with Neurology, needs appointment, reminded again x4 - information provided  Cont therapies  Botulinum toxin injection, will increase:   Right  Mec Major: 20 units    Biceps: 100    FCR  100    FCU  100    FDS  50    FDP  50    Right  Gastroc Med 40    Lastroc Lat 40  2. HTN:   Elevated today, states normally within better control  3. Gait abnormality  Cont therapies   Encouraged ambulation with AFO  4. Tobacco abuse  Now 1/day, encouraged abstinence x5  5. Adhesive capsulitis  Some benefit with with intraarticular injection on 3/1  Benefit with Voltaren gel, reordered   Will order xray of shoulder

## 2019-12-20 DIAGNOSIS — I6930 Unspecified sequelae of cerebral infarction: Secondary | ICD-10-CM | POA: Diagnosis not present

## 2019-12-21 DIAGNOSIS — I6930 Unspecified sequelae of cerebral infarction: Secondary | ICD-10-CM | POA: Diagnosis not present

## 2019-12-23 ENCOUNTER — Other Ambulatory Visit: Payer: Self-pay

## 2019-12-23 ENCOUNTER — Ambulatory Visit: Payer: Medicaid Other | Admitting: Occupational Therapy

## 2019-12-23 ENCOUNTER — Ambulatory Visit: Payer: Medicaid Other

## 2019-12-23 DIAGNOSIS — G8929 Other chronic pain: Secondary | ICD-10-CM | POA: Diagnosis not present

## 2019-12-23 DIAGNOSIS — M25511 Pain in right shoulder: Secondary | ICD-10-CM | POA: Diagnosis not present

## 2019-12-23 DIAGNOSIS — M6281 Muscle weakness (generalized): Secondary | ICD-10-CM

## 2019-12-23 DIAGNOSIS — I6930 Unspecified sequelae of cerebral infarction: Secondary | ICD-10-CM | POA: Diagnosis not present

## 2019-12-23 DIAGNOSIS — I69351 Hemiplegia and hemiparesis following cerebral infarction affecting right dominant side: Secondary | ICD-10-CM

## 2019-12-23 DIAGNOSIS — R2681 Unsteadiness on feet: Secondary | ICD-10-CM | POA: Diagnosis not present

## 2019-12-23 DIAGNOSIS — R2689 Other abnormalities of gait and mobility: Secondary | ICD-10-CM | POA: Diagnosis not present

## 2019-12-23 NOTE — Patient Instructions (Signed)
Access Code: [email or web address] URL: [email or web address] Date: 12/09/2019 Prepared by: Baldomero Lamy  Exercises Sit to Stand - 1 x daily - 7 x weekly - 3 sets - 10 reps Supine Bridge with Resistance Band - 1 x daily - 7 x weekly - 10 reps - 3 sets Clamshell - 1 x daily - 7 x weekly - 3 sets - 10 reps Seated Toe Raise - 1 x daily - 7 x weekly - 2 sets - 10 reps Seated Hamstring Stretch - 1 x daily - 7 x weekly - 1 sets - 3 reps - 30 hold Hooklying Single Leg Bent Knee Fallouts with Resistance - 1 x daily - 7 x weekly - 3 sets - 10 reps Seated March - 1 x daily - 7 x weekly - 10 reps - 3 sets

## 2019-12-23 NOTE — Therapy (Signed)
[facility] [address] [place], [place], [address] Phone: [phone]   Fax:  [phone]  Occupational Therapy Treatment  Patient Details  Name: Casey Petty MRN: [id number] Date of Birth: 1961-03-28 Referring Provider (OT): Dr. Delice Lesch   Encounter Date: 12/23/2019  OT End of Session - 12/23/19 0937    Visit Number  6    Number of Visits  13    Authorization Type  MCD - approved 6 visits    Authorization Time Period  12/09/19 - 01/19/20    Authorization - Visit Number  5    Authorization - Number of Visits  6    OT Start Time  0932    OT Stop Time  1015    OT Time Calculation (min)  43 min    Activity Tolerance  Patient tolerated treatment well    Behavior During Therapy  [facility] for tasks assessed/performed       Past Medical History:  Diagnosis Date  . Hypertension     Past Surgical History:  Procedure Laterality Date  . NO PAST SURGERIES      There were no vitals filed for this visit.  Subjective Assessment - 12/23/19 0936    Subjective   No pain right now. Pt has pain if not stretched properly in higher ranges. 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 PMH: HTN    Currently in Pain?  No/denies       Stretching RUE while seated (self stretch) and by therapist for elbow extension and sh flexion as able. Progressed to supine - pt could only tolerate to 90* sh flexion in P/ROM however could self stretch well above head seated.   AA/ROM RUE for sh flex/ext, abduction, and horizontal abd/add while supported on ball - worked in seated and standing position w/ cues for proper positioning and not leading with shoulder. Also worked on gross grasp/release while arm supported on ball.   NMES x 10 min to wrist and  finger extensors at previous parameters.                       OT Short Term Goals - 12/16/19 1013      OT SHORT TERM GOAL #1   Title  Independent with HEP for RUE    Baseline  Dependent, not yet issued    Time  3    Period  Weeks    Status  Achieved      OT SHORT TERM GOAL #2   Title  Independent with resting hand splint for pm wear    Baseline  dependent, not yet issued    Time  3    Period  Weeks    Status  Achieved      OT SHORT TERM GOAL #3   Title  Pt to verbalize understanding with proper positioning of RUE during stretching HEP and at night to reduce pain Rt shoulder    Baseline  dependent, not yet issued    Time  3    Period  Weeks    Status  On-going        OT Long Term Goals - 12/23/19 1121      OT LONG TERM GOAL #1   Title  Pt to  verbalize understanding of AE needs to increase independence and safety with ADLS (including walker splint, shoe buttons, one handed cutting board)    Baseline  Dependent    Time  6    Period  Weeks    Status  Achieved      OT LONG TERM GOAL #2   Title  Pt to report shoulder pain less than or equal to 5/10 with P/ROM to 90*    Baseline  up to 10/10    Time  6    Period  Weeks    Status  On-going      OT LONG TERM GOAL #3   Title  Pt to use Rt hand as stabalizer for simple bilateral tasks consistently    Baseline  inconsistent    Time  6    Period  Weeks    Status  On-going            Plan - 12/23/19 1008    Clinical Impression Statement  Pt continues to need cues for proper positioning with stretching. Pt responds well to estim    Occupational performance deficits (Please refer to evaluation for details):  ADL's;IADL's;Rest and Sleep;Social Participation    Body Structure / Function / Physical Skills  ADL;ROM;Balance;IADL;Improper spinal/pelvic alignment;Body mechanics;Sensation;Mobility;Flexibility;Strength;Coordination;Tone;UE functional use;Pain;Decreased knowledge of use of  DME;Proprioception;FMC    Rehab Potential  Good    Comorbidities impacting occupational performance description:  Adhesive capsulitis Rt shoulder    OT Frequency  2x / week    OT Duration  6 weeks   however MCD only approved 6 visits   OT Treatment/Interventions  Self-care/ADL training;Therapeutic exercise;Functional Mobility Training;Aquatic Therapy;Ultrasound;Neuromuscular education;Manual Therapy;Splinting;Therapeutic activities;Coping strategies training;DME and/or AE instruction;Cognitive remediation/compensation;Electrical Stimulation;Moist Heat;Passive range of motion;Patient/family education    Plan  continue NMR and spasticity management for RUE, d/c next session    Consulted and Agree with Plan of Care  Patient;Family member/caregiver    Family Member [employer]       Patient will benefit from skilled therapeutic intervention in order to improve the following deficits and impairments:   Body Structure / Function / Physical Skills: ADL, ROM, Balance, IADL, Improper spinal/pelvic alignment, Body mechanics, Sensation, Mobility, Flexibility, Strength, Coordination, Tone, UE functional use, Pain, Decreased knowledge of use of DME, Proprioception, [place]       Visit Diagnosis: Hemiplegia and hemiparesis following cerebral infarction affecting right dominant side (HCC)  Muscle weakness (generalized)    Problem List Patient Active Problem List   Diagnosis Date Noted  . Adhesive capsulitis of right shoulder 10/07/2019  . Abnormality of gait 09/17/2019  . Spastic hemiparesis of right dominant side ([place]) 07/02/2019  . History of CVA with residual deficit 06/09/2019  . Spastic hemiparesis ([place])   . Uncontrolled type 2 diabetes mellitus with hyperglycemia ([place])   . Noncompliance   . Benign essential HTN   . Newly diagnosed diabetes ([place])   . Leucocytosis   . Right hemiparesis ([place])   . Basal ganglia stroke ([place]) 03/04/2019  . Stroke ([place]) 02/28/2019  . Essential  hypertension 02/28/2019  . Tobacco abuse 02/28/2019  . Hyperglycemia 02/28/2019    Carey Bullocks, OTR/L 12/23/2019, 11:21 AM  [facility] [address] [place], [place], [address] Phone: [phone]   Fax:  [phone]  Name: Casey Petty MRN: [id number] Date of Birth: [birth year]

## 2019-12-23 NOTE — Therapy (Signed)
[facility] [address] [place], [place], [address] Phone: [phone]   Fax:  [phone]  Physical Therapy Treatment  Patient Details  Name: Casey Petty MRN: [id number] Date of Birth: Sep 08, 1960 Referring Provider (PT): Ankit Lorie Phenix, MD   Encounter Date: 12/23/2019  PT End of Session - 12/23/19 1021    Visit Number  4    Number of Visits  4    Date for PT Re-Evaluation  02/24/20   POC for 7 weeks, Cert for 90 days   Authorization Type  Medicaid    Authorization Time Period  3 visits 5/03 - 5/23    Authorization - Visit Number  3    Authorization - Number of Visits  3    PT Start Time  1016    PT Stop Time  1100    PT Time Calculation (min)  44 min    Equipment Utilized During Treatment  Gait belt    Activity Tolerance  Patient tolerated treatment well    Behavior During Therapy  WFL for tasks assessed/performed       Past Medical History:  Diagnosis Date  . Hypertension     Past Surgical History:  Procedure Laterality Date  . NO PAST SURGERIES      There were no vitals filed for this visit.  Subjective Assessment - 12/23/19 1019    Subjective  Patient reports doing good. reports having some increased spasms in the calf. No falls. HEP going well.    Patient is accompained by:  Family member    Pertinent History  HTN    Limitations  Walking;Standing    Patient Stated Goals  Get back to how life was prior to the stroke.    Currently in Pain?  No/denies                        [facility] Adult PT Treatment/Exercise - 12/23/19 0001      Transfers   Transfers  Sit to Stand;Stand to Sit    Sit to Stand  5: Supervision    Sit to Stand Details  Tactile cues for weight shifting;Tactile cues for posture;Verbal cues for technique;Manual facilitation for weight shifting    Stand to Sit  5: Supervision    Stand to Sit Details (indicate cue type and reason)  Tactile cues for weight  shifting;Verbal cues for precautions/safety    Comments  completed sit <> stands x 10 reps with RLE placed posterior to LLE for improved strengthening.       Ambulation/Gait   Ambulation/Gait  Yes    Ambulation/Gait Assistance  4: Min guard    Ambulation Distance (Feet)  275 Feet    Assistive device  Straight cane   w/ quad base attachment   Gait Pattern  Decreased arm swing - left;Decreased arm swing - right;Decreased step length - right;Decreased step length - left;Decreased stance time - right;Decreased hip/knee flexion - right;Decreased dorsiflexion - right;Decreased weight shift to right;Poor foot clearance - right    Ambulation Surface  Level;Indoor    Gait Comments  Patient demonstrating improved stability with SPC with quad tip attachment vs. SPC. Still require verbal cues for improved RLE placement and sequencing.       Standardized Balance Assessment   Standardized Balance Assessment  Timed Up and Go Test      Timed Up and Go Test   TUG  Normal TUG    Normal TUG (seconds)  27.[place]  w/ SPC, 25.62 w/ RW     Self-Care   Self-Care  Other Self-Care Comments    Other Self-Care Comments   Educated patient and wife on options of purchasing a quad base tip attachement, due to pt demonstrating improved stability with attachment.      Exercises   Exercises  Other Exercises    Other Exercises   Completed entire review of HEP for further improvements and self management.      Entire Review and Completion of the following Exercises:    Sit to Stand: 3 sets - 10 reps Supine Bridge with Resistance Band: 10 reps - 3 sets Clamshell: 3 sets - 10 reps Seated Toe Raise: 2 sets - 10 reps Seated Hamstring Stretch: 3 reps - 30 hold Hooklying Single Leg Bent Knee Fallouts with Resistance: 3 sets - 10 reps Seated March: 10 reps - 3 sets       PT Education - 12/23/19 1325    Education Details  Patient educated on HEP update and POC upon approval of visits (pending medicaid)    Person(s)  Educated  Patient;Spouse    Methods  Explanation;Demonstration;Handout    Comprehension  Verbalized understanding;Returned demonstration       PT Short Term Goals - 12/23/19 1022      PT SHORT TERM GOAL #1   Title  Patient will be independent with initial HEP    Baseline  Patient states feeling independent with HEP, continue to progress as tolerated by patient    Time  3    Period  Weeks    Status  On-going    Target Date  12/24/19      PT SHORT TERM GOAL #2   Title  Berg Balance will be assessed and LTG written based upon results    Baseline  Berg Balance assessed 5/10    Time  3    Period  Weeks    Status  Achieved    Target Date  12/24/19      PT SHORT TERM GOAL #3   Title  Patient will decreased TUG from 39.69 seconds to 32 seconds with SPC and CGA    Baseline  39.69 seconds, 5/15 - 27.53 secs with SPC and 25.62 secs with walker    Time  3    Period  Weeks    Status  Achieved    Target Date  12/24/19        PT Long Term Goals - 12/16/19 1233      PT LONG TERM GOAL #1   Title  Patient will be independent with final HEP for maintaining functional gains and self management    Baseline  No HEP at this time    Time  7    Period  Weeks    Status  New      PT LONG TERM GOAL #2   Title  Patient will improve Berg Balance Score from 37/56 to >/= 47/56 to demonstrate improved balance and reduce risk of falls    Baseline  37/56 on 5/10    Time  7    Period  Weeks    Status  New      PT LONG TERM GOAL #3   Title  Patient will demonstrate ability to ascend/descend 14 steps w/ single rail on R with supv for improved safety and mobility within the home    Time  7    Period  Weeks    Status  New  PT LONG TERM GOAL #4   Title  Patient will improve TUG to <25 seconds with SPC and Supv for decreased risk of falls    Baseline  39.69 secs    Time  7    Period  Weeks    Status  New      PT LONG TERM GOAL #5   Title  Patient will improve 5 sit <>stand to <15 seconds  for improve BLE strength and reduce risk for falls    Baseline  22.92 secs    Time  7    Period  Weeks    Status  New            Plan - 12/23/19 1329    Clinical Impression Statement  Today's skilled PT session included assessment of patient progress toward STG's. Patient meeting 2/3 of STG today, and demonstrating good progress toward LTG's at this time. Continued gait training in today session with SPC with quadbase tip attachment, PT educated patient and spouse on options for purchase. Updated HEP for further improvements of BLE strength and mobility at home. Pt. will continue to benefit from skilled PT services to progress toward all unmet STG/LTG's. Will resubmit for medicaid approval for 1x/week for 4 weeks.    Personal Factors and Comorbidities  Time since onset of injury/illness/exacerbation;Transportation;Comorbidity 1    Comorbidities  HTN    Examination-Activity Limitations  Transfers;Stairs;Stand;Squat    Examination-Participation Restrictions  Community Activity    Stability/Clinical Decision Making  Evolving/Moderate complexity    Rehab Potential  Fair    PT Frequency  1x / week    PT Duration  3 weeks   followed by 2x/week for 4 weeks   PT Treatment/Interventions  Cryotherapy;Electrical Stimulation;Moist Heat;DME Instruction;Gait training;Stair training;Functional mobility training;Therapeutic activities;Therapeutic exercise;Balance training;Neuromuscular re-education;Patient/family education;Orthotic Fit/Training;Passive range of motion    PT Next Visit Plan  Continue gait training and strengthening exercises.    Consulted and Agree with Plan of Care  Patient;Family member/caregiver    Family Member Consulted  Spouse       Patient will benefit from skilled therapeutic intervention in order to improve the following deficits and impairments:  Abnormal gait, Decreased balance, Decreased endurance, Decreased mobility, Difficulty walking, Impaired sensation, Impaired tone,  Decreased range of motion, Decreased activity tolerance, Decreased knowledge of use of DME, Decreased safety awareness, Decreased strength, Pain  Visit Diagnosis: Hemiplegia and hemiparesis following cerebral infarction affecting right dominant side (HCC)  Muscle weakness (generalized)  Unsteadiness on feet  Other abnormalities of gait and mobility     Problem List Patient Active Problem List   Diagnosis Date Noted  . Adhesive capsulitis of right shoulder 10/07/2019  . Abnormality of gait 09/17/2019  . Spastic hemiparesis of right dominant side ([place]) 07/02/2019  . History of CVA with residual deficit 06/09/2019  . Spastic hemiparesis ([place])   . Uncontrolled type 2 diabetes mellitus with hyperglycemia ([place])   . Noncompliance   . Benign essential HTN   . Newly diagnosed diabetes ([place])   . Leucocytosis   . Right hemiparesis ([place])   . Basal ganglia stroke ([place]) 03/04/2019  . Stroke ([place]) 02/28/2019  . Essential hypertension 02/28/2019  . Tobacco abuse 02/28/2019  . Hyperglycemia 02/28/2019    Jones Bales, PT, DPT 12/23/2019, 1:36 PM  [facility] [address] [place], [place], [address] Phone: [phone]   Fax:  [phone]  Name: Casey Petty MRN: [id number] Date of Birth: Mar 03, 1961

## 2019-12-24 DIAGNOSIS — I6930 Unspecified sequelae of cerebral infarction: Secondary | ICD-10-CM | POA: Diagnosis not present

## 2019-12-25 ENCOUNTER — Encounter: Payer: Medicaid Other | Admitting: Occupational Therapy

## 2019-12-25 DIAGNOSIS — I6930 Unspecified sequelae of cerebral infarction: Secondary | ICD-10-CM | POA: Diagnosis not present

## 2019-12-26 DIAGNOSIS — I6930 Unspecified sequelae of cerebral infarction: Secondary | ICD-10-CM | POA: Diagnosis not present

## 2019-12-27 DIAGNOSIS — I6930 Unspecified sequelae of cerebral infarction: Secondary | ICD-10-CM | POA: Diagnosis not present

## 2019-12-28 DIAGNOSIS — I6930 Unspecified sequelae of cerebral infarction: Secondary | ICD-10-CM | POA: Diagnosis not present

## 2019-12-30 ENCOUNTER — Ambulatory Visit: Payer: Medicaid Other | Admitting: Occupational Therapy

## 2019-12-30 DIAGNOSIS — I6930 Unspecified sequelae of cerebral infarction: Secondary | ICD-10-CM | POA: Diagnosis not present

## 2019-12-31 DIAGNOSIS — I6930 Unspecified sequelae of cerebral infarction: Secondary | ICD-10-CM | POA: Diagnosis not present

## 2020-01-01 ENCOUNTER — Encounter: Payer: Medicaid Other | Admitting: Occupational Therapy

## 2020-01-01 DIAGNOSIS — I6930 Unspecified sequelae of cerebral infarction: Secondary | ICD-10-CM | POA: Diagnosis not present

## 2020-01-02 DIAGNOSIS — I6930 Unspecified sequelae of cerebral infarction: Secondary | ICD-10-CM | POA: Diagnosis not present

## 2020-01-03 ENCOUNTER — Other Ambulatory Visit: Payer: Self-pay

## 2020-01-03 ENCOUNTER — Ambulatory Visit: Payer: Medicaid Other

## 2020-01-03 DIAGNOSIS — G8929 Other chronic pain: Secondary | ICD-10-CM | POA: Diagnosis not present

## 2020-01-03 DIAGNOSIS — I69351 Hemiplegia and hemiparesis following cerebral infarction affecting right dominant side: Secondary | ICD-10-CM | POA: Diagnosis not present

## 2020-01-03 DIAGNOSIS — R2681 Unsteadiness on feet: Secondary | ICD-10-CM

## 2020-01-03 DIAGNOSIS — R2689 Other abnormalities of gait and mobility: Secondary | ICD-10-CM

## 2020-01-03 DIAGNOSIS — I6930 Unspecified sequelae of cerebral infarction: Secondary | ICD-10-CM | POA: Diagnosis not present

## 2020-01-03 DIAGNOSIS — M6281 Muscle weakness (generalized): Secondary | ICD-10-CM | POA: Diagnosis not present

## 2020-01-03 DIAGNOSIS — M25511 Pain in right shoulder: Secondary | ICD-10-CM | POA: Diagnosis not present

## 2020-01-03 NOTE — Therapy (Signed)
[facility] [address] [place], [place], [address] Phone: [phone]   Fax:  [phone]  Physical Therapy Treatment  Patient Details  Name: Casey Petty MRN: [id number] Date of Birth: Aug 11, 1960 Referring Provider (PT): Ankit Lorie Phenix, MD   Encounter Date: 01/03/2020  PT End of Session - 01/03/20 1052    Visit Number  5    Number of Visits  8    Date for PT Re-Evaluation  02/24/20   POC for 7 weeks, Cert for 90 days   Authorization Type  Medicaid    Authorization Time Period  3 visits 5/03 - 5/23, 4 PT visits approved 5/27 - 6/23    Authorization - Visit Number  1    Authorization - Number of Visits  4    PT Start Time  0800    PT Stop Time  0844    PT Time Calculation (min)  44 min    Equipment Utilized During Treatment  Gait belt    Activity Tolerance  Patient tolerated treatment well    Behavior During Therapy  [facility] for tasks assessed/performed       Past Medical History:  Diagnosis Date  . Hypertension     Past Surgical History:  Procedure Laterality Date  . NO PAST SURGERIES      There were no vitals filed for this visit.  Subjective Assessment - 01/03/20 0803    Subjective  Patient reports doing well, reports having spasms in the R foot and R lower arm. No falls. No pain. Reports that he got the quad tip for his SPC.    Patient is accompained by:  Family member    Pertinent History  HTN    Limitations  Walking;Standing    Patient Stated Goals  Get back to how life was prior to the stroke.    Currently in Pain?  No/denies                        [facility] Adult PT Treatment/Exercise - 01/03/20 0806      Transfers   Transfers  Sit to Stand;Stand to Sit    Sit to Stand  5: Supervision    Stand to Sit  5: Supervision      Ambulation/Gait   Ambulation/Gait  Yes    Ambulation/Gait Assistance  4: Min guard    Ambulation Distance (Feet)  230 Feet    Assistive device  Straight  cane   w/ quad tip attachment   Gait Pattern  Decreased arm swing - left;Decreased arm swing - right;Decreased step length - right;Decreased step length - left;Decreased stance time - right;Decreased hip/knee flexion - right;Decreased dorsiflexion - right;Decreased weight shift to right;Poor foot clearance - right    Ambulation Surface  Level;Indoor    Stairs  Yes    Stairs Assistance  4: Min guard    Stair Management Technique  One rail Left    Number of Stairs  12    Height of Stairs  6    Gait Comments  Patient continue to demo good stability w/ SPC with quad tip. Pt and wife reporting that they purchased the quad tip attachment for home. PT requiring verbal cues for foot placement and improved hip/knee flexion.       High Level Balance   High Level Balance Activities  Marching forwards    High Level Balance Comments  completed marching forwards x 4 laps in // bars, w/ UE support  from LUE. Focus on hip/knee flexion with RLE.       Neuro Re-ed    Neuro Re-ed Details   Completed standing R/L toe taps to target, 2 x 10 reps each LE. verbal cues focused on improved weight shift to RLE.  Verbal cues for slowing pace and improved weight shift and alignment.,       Knee/Hip Exercises: Standing   Lateral Step Up  Right;2 sets;10 reps;Hand Hold: 1;Step Height: 4"    Lateral Step Up Limitations  verbal cues for foot placement.    Forward Step Up  Right;2 sets;10 reps;Hand Hold: 1;Step Height: 4"    Forward Step Up Limitations  verbal cues for improved hip/knee flexion.     Other Standing Knee Exercises  Mini squats x 5 reps in // bars with 1 UE support.                PT Short Term Goals - 12/23/19 1022      PT SHORT TERM GOAL #1   Title  Patient will be independent with initial HEP    Baseline  Patient states feeling independent with HEP, continue to progress as tolerated by patient    Time  3    Period  Weeks    Status  On-going    Target Date  12/24/19      PT SHORT TERM GOAL  #2   Title  Berg Balance will be assessed and LTG written based upon results    Baseline  Berg Balance assessed 5/10    Time  3    Period  Weeks    Status  Achieved    Target Date  12/24/19      PT SHORT TERM GOAL #3   Title  Patient will decreased TUG from 39.69 seconds to 32 seconds with SPC and CGA    Baseline  39.69 seconds, 5/15 - 27.53 secs with SPC and 25.62 secs with walker    Time  3    Period  Weeks    Status  Achieved    Target Date  12/24/19        PT Long Term Goals - 12/16/19 1233      PT LONG TERM GOAL #1   Title  Patient will be independent with final HEP for maintaining functional gains and self management    Baseline  No HEP at this time    Time  7    Period  Weeks    Status  New      PT LONG TERM GOAL #2   Title  Patient will improve Berg Balance Score from 37/56 to >/= 47/56 to demonstrate improved balance and reduce risk of falls    Baseline  37/56 on 5/10    Time  7    Period  Weeks    Status  New      PT LONG TERM GOAL #3   Title  Patient will demonstrate ability to ascend/descend 14 steps w/ single rail on R with supv for improved safety and mobility within the home    Time  7    Period  Weeks    Status  New      PT LONG TERM GOAL #4   Title  Patient will improve TUG to <25 seconds with SPC and Supv for decreased risk of falls    Baseline  39.69 secs    Time  7    Period  Weeks    Status  New  PT LONG TERM GOAL #5   Title  Patient will improve 5 sit <>stand to <15 seconds for improve BLE strength and reduce risk for falls    Baseline  22.92 secs    Time  7    Period  Weeks    Status  New            Plan - 01/03/20 1053    Clinical Impression Statement  Today's skilled PT session included continued gait training with SPC w/ quad tip attachement. Patietn demo improved stability with quad tip attachment, CGA as needed with gait. Completed therex targeting lower extremity strenghening, RLE>LLE, with patient tolerating well. Will  continue to progress as tolerated by patient. Pt will continue to benefit from skilled PT services to progress toward all unmet goals.    Personal Factors and Comorbidities  Time since onset of injury/illness/exacerbation;Transportation;Comorbidity 1    Comorbidities  HTN    Examination-Activity Limitations  Transfers;Stairs;Stand;Squat    Examination-Participation Restrictions  Community Activity    Stability/Clinical Decision Making  Evolving/Moderate complexity    Rehab Potential  Fair    PT Frequency  1x / week    PT Duration  3 weeks   followed by 2x/week for 4 weeks   PT Treatment/Interventions  Cryotherapy;Electrical Stimulation;Moist Heat;DME Instruction;Gait training;Stair training;Functional mobility training;Therapeutic activities;Therapeutic exercise;Balance training;Neuromuscular re-education;Patient/family education;Orthotic Fit/Training;Passive range of motion    PT Next Visit Plan  Continue gait training and strengthening exercises, review HEP?    Consulted and Agree with Plan of Care  Patient;Family member/caregiver    Family Member Consulted  Spouse       Patient will benefit from skilled therapeutic intervention in order to improve the following deficits and impairments:  Abnormal gait, Decreased balance, Decreased endurance, Decreased mobility, Difficulty walking, Impaired sensation, Impaired tone, Decreased range of motion, Decreased activity tolerance, Decreased knowledge of use of DME, Decreased safety awareness, Decreased strength, Pain  Visit Diagnosis: Hemiplegia and hemiparesis following cerebral infarction affecting right dominant side (HCC)  Muscle weakness (generalized)  Unsteadiness on feet  Other abnormalities of gait and mobility     Problem List Patient Active Problem List   Diagnosis Date Noted  . Adhesive capsulitis of right shoulder 10/07/2019  . Abnormality of gait 09/17/2019  . Spastic hemiparesis of right dominant side ([place]) 07/02/2019  .  History of CVA with residual deficit 06/09/2019  . Spastic hemiparesis ([place])   . Uncontrolled type 2 diabetes mellitus with hyperglycemia ([place])   . Noncompliance   . Benign essential HTN   . Newly diagnosed diabetes ([place])   . Leucocytosis   . Right hemiparesis ([place])   . Basal ganglia stroke ([place]) 03/04/2019  . Stroke ([place]) 02/28/2019  . Essential hypertension 02/28/2019  . Tobacco abuse 02/28/2019  . Hyperglycemia 02/28/2019    Jones Bales, PT, DPT 01/03/2020, 11:08 AM  [facility] [address] [place], [place], [address] Phone: [phone]   Fax:  [phone]  Name: Averee Buth MRN: [id number] Date of Birth: 04-05-1961

## 2020-01-07 ENCOUNTER — Encounter: Payer: Medicaid Other | Admitting: Occupational Therapy

## 2020-01-07 DIAGNOSIS — I6930 Unspecified sequelae of cerebral infarction: Secondary | ICD-10-CM | POA: Diagnosis not present

## 2020-01-08 ENCOUNTER — Ambulatory Visit: Payer: Medicaid Other | Admitting: Occupational Therapy

## 2020-01-08 ENCOUNTER — Other Ambulatory Visit: Payer: Self-pay

## 2020-01-08 ENCOUNTER — Ambulatory Visit: Payer: Medicaid Other | Attending: Physical Medicine & Rehabilitation

## 2020-01-08 VITALS — BP 152/91

## 2020-01-08 DIAGNOSIS — M6281 Muscle weakness (generalized): Secondary | ICD-10-CM | POA: Diagnosis not present

## 2020-01-08 DIAGNOSIS — R2681 Unsteadiness on feet: Secondary | ICD-10-CM | POA: Insufficient documentation

## 2020-01-08 DIAGNOSIS — R2689 Other abnormalities of gait and mobility: Secondary | ICD-10-CM | POA: Diagnosis not present

## 2020-01-08 DIAGNOSIS — I69351 Hemiplegia and hemiparesis following cerebral infarction affecting right dominant side: Secondary | ICD-10-CM | POA: Diagnosis not present

## 2020-01-08 DIAGNOSIS — I6930 Unspecified sequelae of cerebral infarction: Secondary | ICD-10-CM | POA: Diagnosis not present

## 2020-01-08 MED FILL — ATORVASTATIN 80 MG TABLET: 80 | 90 days supply | Qty: 90 | Fill #2

## 2020-01-08 MED FILL — CARVEDILOL 12.5 MG TABLET: 12.5 | 90 days supply | Qty: 180 | Fill #2

## 2020-01-08 MED FILL — AMLODIPINE BESYLATE 10 MG T: 10 | 90 days supply | Qty: 90 | Fill #2

## 2020-01-08 MED FILL — hydrALAZINE HCL 100 MG TABS: 100 | 90 days supply | Qty: 270 | Fill #2

## 2020-01-08 NOTE — Therapy (Signed)
[facility] [address] [place], [place], [address] Phone: [phone]   Fax:  [phone]  Physical Therapy Treatment  Patient Details  Name: Casey Petty MRN: [id number] Date of Birth: [birth year] Referring Provider (PT): Ankit Lorie Phenix, MD   Encounter Date: 01/08/2020  PT End of Session - 01/08/20 1035    Visit Number  6    Number of Visits  8    Date for PT Re-Evaluation  02/24/20   POC for 7 weeks, Cert for 90 days   Authorization Type  Medicaid    Authorization Time Period  3 visits 5/03 - 5/23, 4 PT visits approved 5/27 - 6/23    Authorization - Visit Number  2    Authorization - Number of Visits  4    PT Start Time  0800    PT Stop Time  0845    PT Time Calculation (min)  45 min    Equipment Utilized During Treatment  Gait belt    Activity Tolerance  Patient tolerated treatment well    Behavior During Therapy  WFL for tasks assessed/performed       Past Medical History:  Diagnosis Date  . Hypertension     Past Surgical History:  Procedure Laterality Date  . NO PAST SURGERIES      Vitals:   01/08/20 0805  BP: (!) 152/91    Subjective Assessment - 01/08/20 0802    Subjective  Patient reports everything going well, and exercises are going well. No falls. reports walking with SPC with quad tip attachment is still going well.    Patient is accompained by:  Family member    Pertinent History  HTN    Limitations  Walking;Standing    Patient Stated Goals  Get back to how life was prior to the stroke.    Currently in Pain?  No/denies                        [facility] Adult PT Treatment/Exercise - 01/08/20 0843      Transfers   Transfers  Sit to Stand;Stand to Sit    Sit to Stand  5: Supervision    Stand to Sit  5: Supervision    Comments  verbal cues for controlled descent and reaching back for w/c prior to descent      Ambulation/Gait   Ambulation/Gait  Yes    Ambulation/Gait  Assistance  5: Supervision;4: Min guard    Ambulation/Gait Assistance Details  Completed gait training x 345 ft with AD, verbal cues for improved hip/knee flexion and improved step length. Compelted gait training x 230 ft after completion of NMR with patient demo improved hip/knee flexion and reduced tone.     Ambulation Distance (Feet)  575 Feet    Assistive device  Straight cane   w/ quad tip attachment   Gait Pattern  Decreased arm swing - left;Decreased arm swing - right;Decreased step length - right;Decreased step length - left;Decreased stance time - right;Decreased hip/knee flexion - right;Decreased dorsiflexion - right;Decreased weight shift to right;Poor foot clearance - right    Ambulation Surface  Level;Indoor      High Level Balance   High Level Balance Activities  Marching forwards    High Level Balance Comments  marching forward x 5 laps in // bars w/ UE support. Primary foucs on hip/knee flexion with RLE, PT providing tactile cues for improved hip/knee flexion today.  Neuro Re-ed    Neuro Re-ed Details   Completed forward step ups on 6" step with RLE 2 x 10 reps. Verbal cues for forms. Completed forward lunge with RLE on 6" step, for improved knee flexion. All completed in // bars with UE support from LUE.  In // bars, completed forward step over black balance beams, with focus on toe clearance and avoidance of circumduction with RLE, x 7 laps. CGA as required throughout with NMR.       Exercises   Exercises  Knee/Hip      Knee/Hip Exercises: Standing   Other Standing Knee Exercises  Mini squats 2 x 10 reps in // bars with 1 UE support.       Knee/Hip Exercises: Supine   Heel Slides  AROM;Right;2 sets;10 reps    Heel Slides Limitations  completed supine with towel under RLE, focus on improved active hip/knee flexion and improved ROM. Also tactile/verbal cues for improved control with knee extension.                PT Short Term Goals - 12/23/19 1022      PT  SHORT TERM GOAL #1   Title  Patient will be independent with initial HEP    Baseline  Patient states feeling independent with HEP, continue to progress as tolerated by patient    Time  3    Period  Weeks    Status  On-going    Target Date  12/24/19      PT SHORT TERM GOAL #2   Title  Berg Balance will be assessed and LTG written based upon results    Baseline  Berg Balance assessed 5/10    Time  3    Period  Weeks    Status  Achieved    Target Date  12/24/19      PT SHORT TERM GOAL #3   Title  Patient will decreased TUG from 39.69 seconds to 32 seconds with SPC and CGA    Baseline  39.69 seconds, 5/15 - 27.53 secs with SPC and 25.62 secs with walker    Time  3    Period  Weeks    Status  Achieved    Target Date  12/24/19        PT Long Term Goals - 12/16/19 1233      PT LONG TERM GOAL #1   Title  Patient will be independent with final HEP for maintaining functional gains and self management    Baseline  No HEP at this time    Time  7    Period  Weeks    Status  New      PT LONG TERM GOAL #2   Title  Patient will improve Berg Balance Score from 37/56 to >/= 47/56 to demonstrate improved balance and reduce risk of falls    Baseline  37/56 on 5/10    Time  7    Period  Weeks    Status  New      PT LONG TERM GOAL #3   Title  Patient will demonstrate ability to ascend/descend 14 steps w/ single rail on R with supv for improved safety and mobility within the home    Time  7    Period  Weeks    Status  New      PT LONG TERM GOAL #4   Title  Patient will improve TUG to <25 seconds with SPC and Supv for decreased risk of falls  Baseline  39.69 secs    Time  7    Period  Weeks    Status  New      PT LONG TERM GOAL #5   Title  Patient will improve 5 sit <>stand to <15 seconds for improve BLE strength and reduce risk for falls    Baseline  22.92 secs    Time  7    Period  Weeks    Status  New            Plan - 01/08/20 1035    Clinical Impression Statement   Continued gait training with AD, patient demoing improved gait pattern and hip/knee flexion with second bout of gait training. Continued NMR focused on improved RLE strengthening, reduction in tone, and coordination. Patient will continue to benefit from skilled PT services to progress toward unmet goals and improve functional mobility.    Personal Factors and Comorbidities  Time since onset of injury/illness/exacerbation;Transportation;Comorbidity 1    Comorbidities  HTN    Examination-Activity Limitations  Transfers;Stairs;Stand;Squat    Examination-Participation Restrictions  Community Activity    Stability/Clinical Decision Making  Evolving/Moderate complexity    Rehab Potential  Fair    PT Frequency  1x / week    PT Duration  3 weeks   followed by 2x/week for 4 weeks   PT Treatment/Interventions  Cryotherapy;Electrical Stimulation;Moist Heat;DME Instruction;Gait training;Stair training;Functional mobility training;Therapeutic activities;Therapeutic exercise;Balance training;Neuromuscular re-education;Patient/family education;Orthotic Fit/Training;Passive range of motion    PT Next Visit Plan  Continue gait training and strengthening exercises, review HEP?    Consulted and Agree with Plan of Care  Patient;Family member/caregiver    Family Member Consulted  Spouse       Patient will benefit from skilled therapeutic intervention in order to improve the following deficits and impairments:  Abnormal gait, Decreased balance, Decreased endurance, Decreased mobility, Difficulty walking, Impaired sensation, Impaired tone, Decreased range of motion, Decreased activity tolerance, Decreased knowledge of use of DME, Decreased safety awareness, Decreased strength, Pain  Visit Diagnosis: Hemiplegia and hemiparesis following cerebral infarction affecting right dominant side (HCC)  Muscle weakness (generalized)  Unsteadiness on feet  Other abnormalities of gait and mobility     Problem  List Patient Active Problem List   Diagnosis Date Noted  . Adhesive capsulitis of right shoulder 10/07/2019  . Abnormality of gait 09/17/2019  . Spastic hemiparesis of right dominant side ([place]) 07/02/2019  . History of CVA with residual deficit 06/09/2019  . Spastic hemiparesis ([address])   . Uncontrolled type 2 diabetes mellitus with hyperglycemia ([place])   . Noncompliance   . Benign essential HTN   . Newly diagnosed diabetes ([place])   . Leucocytosis   . Right hemiparesis ([place])   . Basal ganglia stroke ([place]) 03/04/2019  . Stroke ([place]) 02/28/2019  . Essential hypertension 02/28/2019  . Tobacco abuse 02/28/2019  . Hyperglycemia 02/28/2019    Jones Bales, PT, DPT 01/08/2020, 10:40 AM  [facility] [address] [place], [place], [address] Phone: [phone]   Fax:  [phone]  Name: Jacqueline Holbrook MRN: [id number] Date of Birth: 04-07-1961

## 2020-01-09 DIAGNOSIS — I6930 Unspecified sequelae of cerebral infarction: Secondary | ICD-10-CM | POA: Diagnosis not present

## 2020-01-13 ENCOUNTER — Other Ambulatory Visit: Payer: Self-pay

## 2020-01-13 ENCOUNTER — Ambulatory Visit: Payer: Medicaid Other

## 2020-01-13 DIAGNOSIS — R2689 Other abnormalities of gait and mobility: Secondary | ICD-10-CM

## 2020-01-13 DIAGNOSIS — I6930 Unspecified sequelae of cerebral infarction: Secondary | ICD-10-CM | POA: Diagnosis not present

## 2020-01-13 DIAGNOSIS — I69351 Hemiplegia and hemiparesis following cerebral infarction affecting right dominant side: Secondary | ICD-10-CM | POA: Diagnosis not present

## 2020-01-13 DIAGNOSIS — R2681 Unsteadiness on feet: Secondary | ICD-10-CM

## 2020-01-13 DIAGNOSIS — M6281 Muscle weakness (generalized): Secondary | ICD-10-CM | POA: Diagnosis not present

## 2020-01-13 NOTE — Therapy (Signed)
[facility] [address] [place], [place], [address] Phone: [phone]   Fax:  [phone]  Physical Therapy Treatment  Patient Details  Name: Casey Petty MRN: [id number] Date of Birth: 10/01/1960 Referring Provider (PT): Ankit Lorie Phenix, MD   Encounter Date: 01/13/2020  PT End of Session - 01/13/20 0806    Visit Number  7    Number of Visits  8    Date for PT Re-Evaluation  02/24/20   POC for 7 weeks, Cert for 90 days   Authorization Type  Medicaid    Authorization Time Period  3 visits 5/03 - 5/23, 4 PT visits approved 5/27 - 6/23    Authorization - Visit Number  3    Authorization - Number of Visits  4    PT Start Time  0802    PT Stop Time  0843    PT Time Calculation (min)  41 min    Equipment Utilized During Treatment  Gait belt    Activity Tolerance  Patient tolerated treatment well    Behavior During Therapy  [facility] for tasks assessed/performed       Past Medical History:  Diagnosis Date  . Hypertension     Past Surgical History:  Procedure Laterality Date  . NO PAST SURGERIES      There were no vitals filed for this visit.  Subjective Assessment - 01/13/20 0804    Subjective  Patient reports no new complaints. No falls. [place] and still [place] the w/c.    Patient is accompained by:  Family member    Pertinent History  HTN    Limitations  Walking;Standing    Patient Stated Goals  Get back to how life was prior to the stroke.    Currently in Pain?  No/denies                        [facility] Adult PT Treatment/Exercise - 01/13/20 0845      Transfers   Transfers  Sit to Stand;Stand to Sit    Sit to Stand  5: Supervision    Stand to Sit  5: Supervision    Comments  verbal cues for ensuring brakes are locked prior to rising from w/c for improved safety      Ambulation/Gait   Ambulation/Gait  Yes    Ambulation/Gait Assistance  5: Supervision;4: Min  guard    Ambulation/Gait Assistance Details  completed gait training x 400 ft on outdoor unlevel surfaces including pavement, patient demo decreased hip/knee flexion and require Min A at times. One instance of LOB laterally, with PT requiring Min A to stabilize.     Ambulation Distance (Feet)  400 Feet   230   Assistive device  Straight cane   w/ quad tip attachment   Gait Pattern  Decreased arm swing - left;Decreased arm swing - right;Decreased step length - right;Decreased step length - left;Decreased stance time - right;Decreased hip/knee flexion - right;Decreased dorsiflexion - right;Decreased weight shift to right;Poor foot clearance - right    Ambulation Surface  Level;Indoor;Outdoor;Paved    Gait Comments  Following completion of Scifit, completed gait training on level indoor surfaces. Due to tone, attempted gait with danceskin to R toe to further promote swing.  Patient demonstrate improved swing phase and reduced difficulty getting RLE through.       Neuro Re-ed    Neuro Re-ed Details   Completed forward step ups on 6"  step with RLE 2 x 10 reps. Verbal cues for forms. PT providing manual faciliation for improved foot placement, and hip/knee flexion with completion. Completed mini squats w/ 1 UE support from //  bars, 1 x 15 reps. CGA as requiring with NMR.       Exercises   Exercises  Knee/Hip      Knee/Hip Exercises: Aerobic   Other Aerobic  SciFit at Level 2.0 with BLE, and LUE x 8 minutes to promote BLE strengthening and reduction in extensor tone. PT providing manual faciliation for neutral alignment to RLE throughout.                PT Short Term Goals - 12/23/19 1022      PT SHORT TERM GOAL #1   Title  Patient will be independent with initial HEP    Baseline  Patient states feeling independent with HEP, continue to progress as tolerated by patient    Time  3    Period  Weeks    Status  On-going    Target Date  12/24/19      PT SHORT TERM GOAL #2   Title  Berg  Balance will be assessed and LTG written based upon results    Baseline  Berg Balance assessed 5/10    Time  3    Period  Weeks    Status  Achieved    Target Date  12/24/19      PT SHORT TERM GOAL #3   Title  Patient will decreased TUG from 39.69 seconds to 32 seconds with SPC and CGA    Baseline  39.69 seconds, 5/15 - 27.53 secs with SPC and 25.62 secs with walker    Time  3    Period  Weeks    Status  Achieved    Target Date  12/24/19        PT Long Term Goals - 12/16/19 1233      PT LONG TERM GOAL #1   Title  Patient will be independent with final HEP for maintaining functional gains and self management    Baseline  No HEP at this time    Time  7    Period  Weeks    Status  New      PT LONG TERM GOAL #2   Title  Patient will improve Berg Balance Score from 37/56 to >/= 47/56 to demonstrate improved balance and reduce risk of falls    Baseline  37/56 on 5/10    Time  7    Period  Weeks    Status  New      PT LONG TERM GOAL #3   Title  Patient will demonstrate ability to ascend/descend 14 steps w/ single rail on R with supv for improved safety and mobility within the home    Time  7    Period  Weeks    Status  New      PT LONG TERM GOAL #4   Title  Patient will improve TUG to <25 seconds with SPC and Supv for decreased risk of falls    Baseline  39.69 secs    Time  7    Period  Weeks    Status  New      PT LONG TERM GOAL #5   Title  Patient will improve 5 sit <>stand to <15 seconds for improve BLE strength and reduce risk for falls    Baseline  22.92 secs    Time  7    Period  Weeks    Status  New            Plan - 01/13/20 0935    Clinical Impression Statement  Continued gait training on indoor surfaces, and progressed to outdoor surfaces today. Pt demo increased unsteadiness on unlevel outdoor surfaces and frequent dragging of RLE. Continue NMR focused on improved RLE Strength and reducing extensor tone. Will check progress toward LTG's and update HEP  at next visit. Pt will continue to benefit from skilledd PT services to progress toward all unmet goals.    Personal Factors and Comorbidities  Time since onset of injury/illness/exacerbation;Transportation;Comorbidity 1    Comorbidities  HTN    Examination-Activity Limitations  Transfers;Stairs;Stand;Squat    Examination-Participation Restrictions  Community Activity    Stability/Clinical Decision Making  Evolving/Moderate complexity    Rehab Potential  Fair    PT Frequency  1x / week    PT Duration  3 weeks   followed by 2x/week for 4 weeks   PT Treatment/Interventions  Cryotherapy;Electrical Stimulation;Moist Heat;DME Instruction;Gait training;Stair training;Functional mobility training;Therapeutic activities;Therapeutic exercise;Balance training;Neuromuscular re-education;Patient/family education;Orthotic Fit/Training;Passive range of motion    PT Next Visit Plan  Update HEP, Check LTG's    Consulted and Agree with Plan of Care  Patient;Family member/caregiver    Family Member Consulted  Spouse       Patient will benefit from skilled therapeutic intervention in order to improve the following deficits and impairments:  Abnormal gait, Decreased balance, Decreased endurance, Decreased mobility, Difficulty walking, Impaired sensation, Impaired tone, Decreased range of motion, Decreased activity tolerance, Decreased knowledge of use of DME, Decreased safety awareness, Decreased strength, Pain  Visit Diagnosis: Hemiplegia and hemiparesis following cerebral infarction affecting right dominant side (HCC)  Muscle weakness (generalized)  Unsteadiness on feet  Other abnormalities of gait and mobility     Problem List Patient Active Problem List   Diagnosis Date Noted  . Adhesive capsulitis of right shoulder 10/07/2019  . Abnormality of gait 09/17/2019  . Spastic hemiparesis of right dominant side ([place]) 07/02/2019  . History of CVA with residual deficit 06/09/2019  . Spastic hemiparesis  ([place])   . Uncontrolled type 2 diabetes mellitus with hyperglycemia ([place])   . Noncompliance   . Benign essential HTN   . Newly diagnosed diabetes ([place])   . Leucocytosis   . Right hemiparesis ([place])   . Basal ganglia stroke ([place]) 03/04/2019  . Stroke ([place]) 02/28/2019  . Essential hypertension 02/28/2019  . Tobacco abuse 02/28/2019  . Hyperglycemia 02/28/2019    Jones Bales, PT, DPT 01/13/2020, 9:37 AM  [facility] [address] [place], [place], [address] Phone: [phone]   Fax:  [phone]  Name: Casey Petty MRN: [id number] Date of Birth: 13-Mar-1961

## 2020-01-14 DIAGNOSIS — I6930 Unspecified sequelae of cerebral infarction: Secondary | ICD-10-CM | POA: Diagnosis not present

## 2020-01-15 DIAGNOSIS — I6930 Unspecified sequelae of cerebral infarction: Secondary | ICD-10-CM | POA: Diagnosis not present

## 2020-01-16 DIAGNOSIS — I6930 Unspecified sequelae of cerebral infarction: Secondary | ICD-10-CM | POA: Diagnosis not present

## 2020-01-17 DIAGNOSIS — I6930 Unspecified sequelae of cerebral infarction: Secondary | ICD-10-CM | POA: Diagnosis not present

## 2020-01-18 DIAGNOSIS — I6930 Unspecified sequelae of cerebral infarction: Secondary | ICD-10-CM | POA: Diagnosis not present

## 2020-01-20 ENCOUNTER — Ambulatory Visit: Payer: Medicaid Other

## 2020-01-20 ENCOUNTER — Other Ambulatory Visit: Payer: Self-pay

## 2020-01-20 DIAGNOSIS — R2681 Unsteadiness on feet: Secondary | ICD-10-CM

## 2020-01-20 DIAGNOSIS — M6281 Muscle weakness (generalized): Secondary | ICD-10-CM

## 2020-01-20 DIAGNOSIS — R2689 Other abnormalities of gait and mobility: Secondary | ICD-10-CM | POA: Diagnosis not present

## 2020-01-20 DIAGNOSIS — I6930 Unspecified sequelae of cerebral infarction: Secondary | ICD-10-CM | POA: Diagnosis not present

## 2020-01-20 DIAGNOSIS — I69351 Hemiplegia and hemiparesis following cerebral infarction affecting right dominant side: Secondary | ICD-10-CM | POA: Diagnosis not present

## 2020-01-20 NOTE — Therapy (Signed)
[facility] [address] [place], [place], [address] Phone: [phone]   Fax:  [phone]  Physical Therapy Treatment  Patient Details  Name: Casey Petty MRN: [id number] Date of Birth: [birth year] Referring Provider (PT): Ankit Lorie Phenix, MD  PHYSICAL THERAPY DISCHARGE SUMMARY  Visits from Start of Care: 8  Current functional level related to goals / functional outcomes: See Clinical impression statement for details.    Remaining deficits: Abnormal gait, increased tone, decreased balance, and at risk for falls.   Education / Equipment: Educated on progress toward goals at this time, and HEP.  Plan: Patient agrees to discharge.  Patient goals were partially met. Patient is being discharged due to                                                     ?Patient is being discharged at this time due to reaching max visit limit per [facility] requirements.????        Encounter Date: 01/20/2020   PT End of Session - 01/20/20 0729    Visit Number 8    Number of Visits 8    Date for PT Re-Evaluation 02/24/20   POC for 7 weeks, Cert for 90 days   Authorization Type Medicaid    Authorization Time Period 3 visits 5/03 - 5/23, 4 PT visits approved 5/27 - 6/23    Authorization - Visit Number 4    Authorization - Number of Visits 4    PT Start Time 0725    PT Stop Time 0810    PT Time Calculation (min) 45 min    Equipment Utilized During Treatment Gait belt    Activity Tolerance Patient tolerated treatment well    Behavior During Therapy WFL for tasks assessed/performed           Past Medical History:  Diagnosis Date  . Hypertension     Past Surgical History:  Procedure Laterality Date  . NO PAST SURGERIES      There were no vitals filed for this visit.   Subjective Assessment - 01/20/20 0727    Subjective Patient reports no new issues. No falls. Reports been walking with the cane throughout the house. No  issues getting through the new house. Has a ramp now and does not have any stairs.    Patient is accompained by: Family member    Pertinent History HTN    Limitations Walking;Standing    Patient Stated Goals Get back to how life was prior to the stroke.    Currently in Pain? No/denies                             [facility] Adult PT Treatment/Exercise - 01/20/20 0741      Transfers   Transfers Sit to Stand;Stand to Sit    Sit to Stand 5: Supervision    Five time sit to stand comments  completed in 14.93 secs w/o UE support    Stand to Sit 5: Supervision      Ambulation/Gait   Ambulation/Gait Yes    Ambulation/Gait Assistance 5: Supervision    Ambulation/Gait Assistance Details completed gait throughout therapy gym during session    Ambulation Distance (Feet) 115 Feet    Assistive device Straight cane   w/ quad tip attachment  Gait Pattern Decreased arm swing - left;Decreased arm swing - right;Decreased step length - right;Decreased step length - left;Decreased stance time - right;Decreased hip/knee flexion - right;Decreased dorsiflexion - right;Decreased weight shift to right;Poor foot clearance - right    Ambulation Surface Level;Indoor    Stairs Yes    Stairs Assistance 5: Supervision    Stair Management Technique One rail Right;Alternating pattern;Step to pattern;Forwards    Number of Stairs 16    Height of Stairs 6    Gait Comments all gait completed with R AFO donned and R arm brace donned.       Standardized Balance Assessment   Standardized Balance Assessment Berg Balance Test;Timed Up and Go Test      Berg Balance Test   Sit to Stand Able to stand without using hands and stabilize independently    Standing Unsupported Able to stand safely 2 minutes    Sitting with Back Unsupported but Feet Supported on Floor or Stool Able to sit safely and securely 2 minutes    Stand to Sit Sits safely with minimal use of hands    Transfers Able to transfer safely, minor  use of hands    Standing Unsupported with Eyes Closed Able to stand 10 seconds safely    Standing Ubsupported with Feet Together Able to place feet together independently and stand for 1 minute with supervision    From Standing, Reach Forward with Outstretched Arm Can reach forward >12 cm safely (5")    From Standing Position, Pick up Object from Floor Able to pick up shoe, needs supervision    From Standing Position, Turn to Look Behind Over each Shoulder Looks behind one side only/other side shows less weight shift    Turn 360 Degrees Able to turn 360 degrees safely but slowly    Standing Unsupported, Alternately Place Feet on Step/Stool Able to complete 4 steps without aid or supervision    Standing Unsupported, One Foot in Front Able to take small step independently and hold 30 seconds    Standing on One Leg Able to lift leg independently and hold equal to or more than 3 seconds    Total Score 44      Timed Up and Go Test   TUG Normal TUG    Normal TUG (seconds) 17.9      High Level Balance   High Level Balance Activities Marching forwards;Backward walking;Side stepping    High Level Balance Comments Completed marching forwards followed by backwards marching x 3 laps each with LUE support from counter top. Also completed side stepping x 3 laps down and back countertop, verbal cues to avoid sliding RLE during step. PT educating on proper completion for HEP.       Exercises   Exercises Other Exercises    Other Exercises  Reviewed all current exercises on HEP, and completed education and practice of new additions to ensure proper completion at home upon discharge. Provided patient with progression options, as well as a green theraband to allow for further strength gains upon discharge.       Knee/Hip Exercises: Standing   Other Standing Knee Exercises At countertop: completed mini squats with LUE support from counter, 1 x 10 reps.                   PT Education - 01/20/20 0813      Education Details Educated on progress toward goals, reviewed and updates to HEP, and walking program around the house.  Person(s) Educated Patient;Spouse    Methods Explanation;Demonstration;Handout    Comprehension Verbalized understanding;Returned demonstration            PT Short Term Goals - 01/20/20 0729      PT SHORT TERM GOAL #1   Title Patient will be independent with initial HEP    Baseline Patient reports independence with HEP    Time 3    Period Weeks    Status Achieved    Target Date 12/24/19      PT SHORT TERM GOAL #2   Title Berg Balance will be assessed and LTG written based upon results    Baseline Berg Balance assessed 5/10    Time 3    Period Weeks    Status Achieved    Target Date 12/24/19      PT SHORT TERM GOAL #3   Title Patient will decreased TUG from 39.69 seconds to 32 seconds with SPC and CGA    Baseline 39.69 seconds, 5/15 - 27.53 secs with SPC and 25.62 secs with walker    Time 3    Period Weeks    Status Achieved    Target Date 12/24/19             PT Long Term Goals - 01/20/20 0730      PT LONG TERM GOAL #1   Title Patient will be independent with final HEP for maintaining functional gains and self management    Baseline Patient reports independence with Final HEP    Time 7    Period Weeks    Status Achieved      PT LONG TERM GOAL #2   Title Patient will improve Berg Balance Score from 37/56 to >/= 47/56 to demonstrate improved balance and reduce risk of falls    Baseline 37/56 on 5/10, 44/56 on 6/14    Time 7    Period Weeks    Status Not Met      PT LONG TERM GOAL #3   Title Patient will demonstrate ability to ascend/descend 14 steps w/ single rail on R with supv for improved safety and mobility within the home    Baseline Pt demo ability to ascend/descend 14 steps with single rail, supv    Time 7    Period Weeks    Status Achieved      PT LONG TERM GOAL #4   Title Patient will improve TUG to <25 seconds with SPC  and Supv for decreased risk of falls    Baseline 39.69 secs, 17.90 secs with SPC and supv    Time 7    Period Weeks    Status Achieved      PT LONG TERM GOAL #5   Title Patient will improve 5 sit <>stand to <15 seconds for improve BLE strength and reduce risk for falls    Baseline 22.92 secs, 14.93 secs    Time 7    Period Weeks    Status Achieved                 Plan - 01/20/20 0820    Clinical Impression Statement Today's skilled PT session included assesment of patient's progress toward LTG's and discharge due to reaching visit limit for Medicaid at this time. Patient demonstrating good progress with PT services, was able to meet LTG #1, 3, 4, and 5. Patient did not meeting LTG #2, in regards to [employer]. However did improve Berg Balance from 37/56 to 44/56 demonstrating reduced risk for fall and  improved balance. Pt demo improved overall functional mobility. PT reviewed, updated, and educated on HEP to allow for progression of activities at home. Pt would continue to benefit from skilled PT services but will be discharged at this time due to reaching visit limit.    Personal Factors and Comorbidities Time since onset of injury/illness/exacerbation;Transportation;Comorbidity 1    Comorbidities HTN    Examination-Activity Limitations Transfers;Stairs;Stand;Squat    Examination-Participation Restrictions Community Activity    Stability/Clinical Decision Making Evolving/Moderate complexity    Rehab Potential Fair    PT Frequency 1x / week    PT Duration 3 weeks   followed by 2x/week for 4 weeks   PT Treatment/Interventions Cryotherapy;Electrical Stimulation;Moist Heat;DME Instruction;Gait training;Stair training;Functional mobility training;Therapeutic activities;Therapeutic exercise;Balance training;Neuromuscular re-education;Patient/family education;Orthotic Fit/Training;Passive range of motion    Consulted and Agree with Plan of Care Patient;Family member/caregiver    Family  Member Consulted Spouse           Patient will benefit from skilled therapeutic intervention in order to improve the following deficits and impairments:  Abnormal gait, Decreased balance, Decreased endurance, Decreased mobility, Difficulty walking, Impaired sensation, Impaired tone, Decreased range of motion, Decreased activity tolerance, Decreased knowledge of use of DME, Decreased safety awareness, Decreased strength, Pain  Visit Diagnosis: Hemiplegia and hemiparesis following cerebral infarction affecting right dominant side (HCC)  Muscle weakness (generalized)  Unsteadiness on feet  Other abnormalities of gait and mobility     Problem List Patient Active Problem List   Diagnosis Date Noted  . Adhesive capsulitis of right shoulder 10/07/2019  . Abnormality of gait 09/17/2019  . Spastic hemiparesis of right dominant side ([place]) 07/02/2019  . History of CVA with residual deficit 06/09/2019  . Spastic hemiparesis ([place])   . Uncontrolled type 2 diabetes mellitus with hyperglycemia ([place])   . Noncompliance   . Benign essential HTN   . Newly diagnosed diabetes ([place])   . Leucocytosis   . Right hemiparesis ([place])   . Basal ganglia stroke ([place]) 03/04/2019  . Stroke ([place]) 02/28/2019  . Essential hypertension 02/28/2019  . Tobacco abuse 02/28/2019  . Hyperglycemia 02/28/2019    Jones Bales, PT, DPT 01/20/2020, 8:24 AM  [facility] [address] [place], [place], [address] Phone: [phone]   Fax:  [phone]  Name: Jeno Calleros MRN: [id number] Date of Birth: 15-Jan-1961

## 2020-01-20 NOTE — Patient Instructions (Signed)
Access Code: [email or web address] URL: [email or web address] Date: 01/20/2020 Prepared by: Baldomero Lamy  Exercises Sit to Stand - 1 x daily - 7 x weekly - 3 sets - 10 reps Supine Bridge with Resistance Band - 1 x daily - 7 x weekly - 10 reps - 3 sets Clamshell - 1 x daily - 7 x weekly - 3 sets - 10 reps Seated Toe Raise - 1 x daily - 7 x weekly - 2 sets - 10 reps Seated Hamstring Stretch - 1 x daily - 7 x weekly - 1 sets - 3 reps - 30 hold Hooklying Single Leg Bent Knee Fallouts with Resistance - 1 x daily - 7 x weekly - 3 sets - 10 reps Seated March - 1 x daily - 7 x weekly - 10 reps - 3 sets Mini Squat with Counter Support - 1 x daily - 7 x weekly - 10 reps - 3 sets Side Stepping with Counter Support - 1 x daily - 7 x weekly - 10 reps - 3 sets Walking March with Countertop Support - 1 x daily - 7 x weekly - 3 sets - 10 reps Backward Walking with Counter Support - 1 x daily - 7 x weekly - 10 reps - 3 sets

## 2020-01-21 DIAGNOSIS — I6930 Unspecified sequelae of cerebral infarction: Secondary | ICD-10-CM | POA: Diagnosis not present

## 2020-01-21 MED FILL — ATORVASTATIN 80 MG TABLET: 80 | 90 days supply | Qty: 90 | Fill #2

## 2020-01-21 MED FILL — hydrALAZINE HCL 100 MG TABS: 100 | 90 days supply | Qty: 270 | Fill #2

## 2020-01-21 MED FILL — DICLOFENAC SODIUM 1% GEL: 1 | 30 days supply | Qty: 300 | Fill #1

## 2020-01-21 MED FILL — CARVEDILOL 12.5 MG TABLET: 12.5 | 90 days supply | Qty: 180 | Fill #2

## 2020-01-21 MED FILL — AMLODIPINE BESYLATE 10 MG T: 10 | 90 days supply | Qty: 90 | Fill #2

## 2020-01-22 DIAGNOSIS — I6930 Unspecified sequelae of cerebral infarction: Secondary | ICD-10-CM | POA: Diagnosis not present

## 2020-01-23 ENCOUNTER — Encounter: Payer: Self-pay | Admitting: Adult Health

## 2020-01-23 ENCOUNTER — Ambulatory Visit: Payer: Medicaid Other | Admitting: Adult Health

## 2020-01-23 VITALS — BP 140/79 | HR 73 | Ht 67.0 in | Wt 165.0 lb

## 2020-01-23 DIAGNOSIS — E785 Hyperlipidemia, unspecified: Secondary | ICD-10-CM | POA: Diagnosis not present

## 2020-01-23 DIAGNOSIS — I10 Essential (primary) hypertension: Secondary | ICD-10-CM

## 2020-01-23 DIAGNOSIS — E119 Type 2 diabetes mellitus without complications: Secondary | ICD-10-CM

## 2020-01-23 DIAGNOSIS — I639 Cerebral infarction, unspecified: Secondary | ICD-10-CM | POA: Diagnosis not present

## 2020-01-23 DIAGNOSIS — G8111 Spastic hemiplegia affecting right dominant side: Secondary | ICD-10-CM | POA: Diagnosis not present

## 2020-01-23 DIAGNOSIS — I6381 Other cerebral infarction due to occlusion or stenosis of small artery: Secondary | ICD-10-CM

## 2020-01-23 DIAGNOSIS — R471 Dysarthria and anarthria: Secondary | ICD-10-CM | POA: Diagnosis not present

## 2020-01-23 DIAGNOSIS — I6930 Unspecified sequelae of cerebral infarction: Secondary | ICD-10-CM | POA: Diagnosis not present

## 2020-01-23 NOTE — Progress Notes (Signed)
[facility] [address] [place]. [place] [address] [phone]       OFFICE FOLLOW UP NOTE  [name] Date of Birth:  11-09-1960 Medical Record Number:  [id number]   Reason for Referral: stroke follow up    CHIEF COMPLAINT:  Chief Complaint  Patient presents with   Follow-up    treatment rm, with partner stroke fu, treatment rm, pt states he is doing well, some shoulder pain    HPI:  Today, 01/23/2020, Casey Petty is being seen for stroke follow-up accompanied by his fiance.  Residual deficits of right spastic hemiparesis and dysarthria.  Continues to follow with PMR Dr. Posey Pronto for Botox injections and ongoing use of baclofen as well as management of right shoulder adhesive capsulitis. He does report benefit with use of botox. Completed approved Medicaid visits with PT and OT with great benefit and is ambulating with a cane and w/c for long distance. Also continues to use AFO brace. Continues on aspirin and atorvastatin for secondary stroke prevention. He has not had recent lab work by PCP. Blood pressure today 140/79.  No concerns at this time.     History p participated inrovided for reference purposes only Update 07/25/2019: Casey Petty is a 59 year old male who is being seen today for stroke follow-up.  Residual deficits of right spastic hemiparesis, mild right facial droop and dysarthria.  He does endorse ongoing improvement.  Continues to follow with physical medicine rehab Dr. Posey Pronto with plans on receiving Botox for ongoing spasticity.  He also had baclofen dosage increased to 20 mg 3 times daily.  He has not received any therapies as previously Medicaid pending.  He primarily uses wheelchair for transfers but has been walking very short distances with assistance of his wife.  Previously using AFO brace but not currently in place with patient stating "I want to try something different".  Continues on aspirin and atorvastatin for secondary stroke  prevention without side effects.  Prior lipid panel on 05/08/2019 showed LDL 57.  Blood pressure today 140/90.  Blood work obtained yesterday by PCP Dr. Chapman Fitch with A1c 8.4.  Continues to follow with PCP actively for ongoing management.  Denies new or worsening stroke/TIA symptoms.  Initial visit 05/01/2019: Casey Petty is being seen today for hospital follow-up accompanied by his fiance.  Residual deficits of right hemiparesis and mild dysarthria with mild right facial droop.  He is not currently receiving any therapies as he is Medicaid pending.  He does continue to do exercises at home with ongoing improvement.  He does ambulate without assistive device but does use wheelchair for long distance.  Currently has AFO brace in place for right ankle weakness.  Completed 3 weeks DAPT and continues on aspirin alone without bleeding or bruising.  Continues on atorvastatin 80 mg daily without myalgias.  Blood pressure today 125/71.  Glucose levels have been stable with ongoing use of metformin.  Continues to follow with PCP for HTN, HLD and DM management.  Denies new or worsening stroke/TIA symptoms.  Stroke admission 02/28/2019: Casey Petty is a 59 y.o. male with history of hypertension  who presented to [facility] ED on 02/28/2019 with right sided weakness and gait difficulties. He did not receive IV t-PA due to late presentation (>4.5 hours from time of onset).  Stroke work-up revealed left BG small infarct as evidenced on MRI likely secondary to small vessel disease source with resultant dysarthria and right hemiparesis.  TCD and carotid Doppler unremarkable.  2D echo normal  EF.  Recommended DAPT for 3 weeks and aspirin alone. Hx of HTN with long-term BP goal normotensive range and discharged on home dose amlodipine and initiated lisinopril.  Initiated atorvastatin 80 mg daily for HLD management.  Current tobacco use with smoking cessation counseling provided.  Uncontrolled DM with A1c 7.9.  Other stroke risk factors  include EtOH use but no prior history of stroke.  He was discharged to [facility] for ongoing therapy and discharged home on 03/20/2019.        ROS:   14 system review of systems performed and negative with exception of speech difficulty, weakness and ambulation difficulty  PMH:  Past Medical History:  Diagnosis Date   Hypertension     PSH:  Past Surgical History:  Procedure Laterality Date   NO PAST SURGERIES      Social History:  Social History   Socioeconomic History   Marital status: Single    Spouse name: Not on file   Number of children: Not on file   Years of education: Not on file   Highest education level: Not on file  Occupational History   Not on file  Tobacco Use   Smoking status: Current Every Day Smoker   Smokeless tobacco: Never Used   Tobacco comment: Smoking 4 cigs/day  Substance and Sexual Activity   Alcohol use: Yes    Alcohol/week: 2.0 standard drinks    Types: 2 Standard drinks or equivalent per week   Drug use: No   Sexual activity: Never  Other Topics Concern   Not on file  Social History Narrative   Not on file   Social Determinants of Health   Financial Resource Strain:    Difficulty of Paying Living Expenses:   Food Insecurity:    Worried About Charity fundraiser in the Last Year:    Arboriculturist in the Last Year:   Transportation Needs:    Film/video editor (Medical):    Lack of Transportation (Non-Medical):   Physical Activity:    Days of Exercise per Week:    Minutes of Exercise per Session:   Stress:    Feeling of Stress :   Social Connections:    Frequency of Communication with Friends and Family:    Frequency of Social Gatherings with Friends and Family:    Attends Religious Services:    Active Member of Clubs or Organizations:    Attends Music therapist:    Marital Status:   Intimate Partner Violence:    Fear of Current or Ex-Partner:    Emotionally Abused:     Physically Abused:    Sexually Abused:     Family History:  Family History  Problem Relation Age of Onset   Heart attack Father    Cancer Neg Hx     Medications:   Current Outpatient Medications on File Prior to Visit  Medication Sig Dispense Refill   acetaminophen (TYLENOL) 325 MG tablet Take 1-2 tablets (325-650 mg total) by mouth every 4 (four) hours as needed for mild pain.     amLODipine (NORVASC) 10 MG tablet Take 1 tablet (10 mg total) by mouth daily. To lower blood pressure 90 tablet 3   aspirin 81 MG EC tablet Take 1 tablet (81 mg total) by mouth daily. 100 tablet 0   atorvastatin (LIPITOR) 80 MG tablet One pill in the evenings to lower cholesterol 90 tablet 3   Blood Glucose Monitoring Suppl (TRUE METRIX METER) w/Device KIT Check blood sugar  fasting and before meals and again if pt feels bad (symptoms of hypo). 1 kit 0   carvedilol (COREG) 12.5 MG tablet Take 1 tablet (12.5 mg total) by mouth 2 (two) times daily with a meal. For blood pressure 180 tablet 3   diclofenac Sodium (VOLTAREN) 1 % GEL Apply 2 g topically 4 (four) times daily. 350 g 1   empagliflozin (JARDIANCE) 10 MG TABS tablet Take 10 mg by mouth daily before breakfast. 90 tablet 1   famotidine (PEPCID) 20 MG tablet Take 1 tablet (20 mg total) by mouth daily. 30 tablet 0   glucose blood (TRUE METRIX BLOOD GLUCOSE TEST) test strip Use as instructed 100 each 12   hydrALAZINE (APRESOLINE) 100 MG tablet Take 1 tablet (100 mg total) by mouth every 8 (eight) hours. To lower blood pressure 270 tablet 3   nicotine (NICODERM CQ - DOSED IN MG/24 HOURS) 21 mg/24hr patch Place 1 patch (21 mg total) onto the skin daily. 28 patch 0   TRUEplus Lancets 28G MISC Check blood sugar fasting and before meals and again if pt feels bad (symptoms of hypo). 100 each 12   No current facility-administered medications on file prior to visit.    Allergies:   Allergies  Allergen Reactions   Lisinopril Swelling     Angioedema- lips/face     Physical Exam  Vitals:   01/23/20 0926  BP: 140/79  Pulse: 73  Weight: 165 lb (74.8 kg)  Height: _0  (1.702 m)   Body mass index is 25.84 kg/m. No exam data present  General: well developed, well nourished, pleasant middle-age African-American male, seated, in no evident distress Head: head normocephalic and atraumatic.   Neck: supple with no carotid or supraclavicular bruits Cardiovascular: regular rate and rhythm, no murmurs Musculoskeletal: no deformity Skin:  no rash/petichiae Vascular:  Normal pulses all extremities   Neurologic Exam Mental Status: Awake and fully alert.   Mild dysarthria.  Oriented to place and time. Recent and remote memory intact. Attention span, concentration and fund of knowledge appropriate. Mood and affect appropriate.  Cranial Nerves: Pupils equal, briskly reactive to light. Extraocular movements full without nystagmus. Visual fields full to confrontation. Hearing intact. Facial sensation intact.  Right nasolabial fold flattening Motor: RUE: 4/5 with decreased grip strength and spasticity RLE: 3+/5 hip flexor and 5/5 knee flexion and extension and 3/5 ankle dorsiflexion with AFO in place Full strength left upper and lower extremity Sensory.: intact to touch , pinprick , position and vibratory sensation.  Coordination: Rapid alternating movements normal on left side. Finger-to-nose and heel-to-shin performed accurately on left side. Gait and Station: Stands from seated position without difficulty.  Stance is normal.  Gait demonstrates hemiplegic type gait without use of assistive device (cane not present during visit) without evidence of imbalance Reflexes: Brisk right upper and lower extremity ; 1+ left upper and lower extremity. Toes downgoing.      ASSESSMENT: Casey Petty is a 59 y.o. year old male presented with right-sided weakness and gait difficulties on 02/28/2019 with stroke work-up revealing left BG small  infarct secondary to small vessel disease. Vascular risk factors include HTN, HLD and tobacco use.  Residual deficits of right spastic hemiparesis, mild right facial droop and mild dysarthria    PLAN:  1. Left BG infarct:  -Continue HEP for residual deficits with ongoing improvement as well as ongoing use of Botox and baclofen managed by PMR for ongoing spasticity.  May consider restart of therapies in the beginning of next  year as he has completed all therapies approved from insurance -Continue aspirin 81 mg daily  and atorvastatin for secondary stroke prevention. Maintain strict control of hypertension with blood pressure goal below 130/90, diabetes with hemoglobin A1c goal below 6.5% and cholesterol with LDL cholesterol (bad cholesterol) goal below 70 mg/dL.  I also advised the patient to eat a healthy diet with plenty of whole grains, cereals, fruits and vegetables, exercise regularly with at least 30 minutes of continuous activity daily and maintain ideal body weight. 2. HTN: Stable. Continue to follow with PCP for monitoring and management 3. HLD: Repeat lipid panel today.  Continuation of atorvastatin and ongoing follow-up with PCP for prescribing, monitoring and management 4. DM: Repeat A1c today.  Continue to monitor glucose levels at home and ongoing follow-up with PCP     Follow up in 6 months or call earlier if needed  CC: Fulp, Cammie, MD Antony Contras, MD    I spent 30 minutes of face-to-face and non-face-to-face time with patient and fianc.  This included previsit chart review, lab review, study review, order entry, electronic health record documentation, patient education   [name], [facility]  [facility] [address] [place], [place] [address]  Phone [phone] Fax [phone] Note: This document was prepared with digital dictation and possible smart phrase technology. Any transcriptional errors that result from this process  are unintentional.

## 2020-01-23 NOTE — Patient Instructions (Signed)
Continue aspirin 81 mg daily  and atorvastatin  for secondary stroke prevention  Continue to follow up with PCP regarding cholesterol, blood pressure and diabetes management   We will check your cholesterol and diabetes level while you are here today to ensure satisfactory management  Continue to follow with Dr. Posey Pronto for botox, baclofen and shoulder pain management  Maintain strict control of hypertension with blood pressure goal below 130/90, diabetes with hemoglobin A1c goal below 6.5% and cholesterol with LDL cholesterol (bad cholesterol) goal below 70 mg/dL. I also advised the patient to eat a healthy diet with plenty of whole grains, cereals, fruits and vegetables, exercise regularly and maintain ideal body weight.  Followup in the future with me in 6 months or call earlier if needed       Thank you for coming to see [place] at [facility]. I hope we have been able to provide you high quality care today.  You may receive a patient satisfaction survey over the next few weeks. We would appreciate your feedback and comments so that we may continue to improve ourselves and the health of our patients.

## 2020-01-24 DIAGNOSIS — I6930 Unspecified sequelae of cerebral infarction: Secondary | ICD-10-CM | POA: Diagnosis not present

## 2020-01-24 LAB — LIPID PANEL
Chol/HDL Ratio: 7.8 ratio — ABNORMAL HIGH (ref 0.0–5.0)
Cholesterol, Total: 217 mg/dL — ABNORMAL HIGH (ref 100–199)
HDL: 28 mg/dL — ABNORMAL LOW (ref >39)
LDL Chol Calc (NIH): 149 mg/dL — ABNORMAL HIGH (ref 0–99)
Triglycerides: 220 mg/dL — ABNORMAL HIGH (ref 0–149)
VLDL Cholesterol Cal: 40 mg/dL (ref 5–40)

## 2020-01-24 LAB — HEMOGLOBIN A1C
Est. average glucose Bld gHb Est-mCnc: 303 mg/dL
Hgb A1c MFr Bld: 12.2 % — ABNORMAL HIGH (ref 4.8–5.6)

## 2020-01-24 NOTE — Progress Notes (Signed)
I agree with the above plan 

## 2020-01-25 DIAGNOSIS — I6930 Unspecified sequelae of cerebral infarction: Secondary | ICD-10-CM | POA: Diagnosis not present

## 2020-01-27 ENCOUNTER — Other Ambulatory Visit: Payer: Self-pay | Admitting: Adult Health

## 2020-01-27 ENCOUNTER — Telehealth: Payer: Self-pay | Admitting: *Deleted

## 2020-01-27 DIAGNOSIS — I6930 Unspecified sequelae of cerebral infarction: Secondary | ICD-10-CM | POA: Diagnosis not present

## 2020-01-27 MED ORDER — EZETIMIBE 10 MG PO TABS: 10.0000 mg | ORAL_TABLET | Freq: Every day | ORAL | 3 refills | Status: DC

## 2020-01-27 MED FILL — EZETIMIBE 10 MG TAB: 10 | 90 days supply | Qty: 90 | Fill #0

## 2020-01-27 NOTE — Telephone Encounter (Addendum)
Spoke with patient and informed him that recent lab work showed increased cholesterol levels with recent LDL 149 with goal of less than 70. Janett Billow NP  recommends initiating Zetia 10 mg daily in addition to atorvastatin 80 mg daily with repeat levels with PCP in 2 to 3 months.  Advised him his labs showed uncontrolled diabetes with A1c 12.2 (prior 8.4) and needs follow-up with PCP for further discussion and management of uncontrolled Diabetes. He had no questions, verbalized understanding, appreciation. Routed results to PCP.

## 2020-01-28 DIAGNOSIS — I6930 Unspecified sequelae of cerebral infarction: Secondary | ICD-10-CM | POA: Diagnosis not present

## 2020-01-29 DIAGNOSIS — I6930 Unspecified sequelae of cerebral infarction: Secondary | ICD-10-CM | POA: Diagnosis not present

## 2020-01-31 DIAGNOSIS — I6930 Unspecified sequelae of cerebral infarction: Secondary | ICD-10-CM | POA: Diagnosis not present

## 2020-02-01 DIAGNOSIS — I6930 Unspecified sequelae of cerebral infarction: Secondary | ICD-10-CM | POA: Diagnosis not present

## 2020-02-03 DIAGNOSIS — I6930 Unspecified sequelae of cerebral infarction: Secondary | ICD-10-CM | POA: Diagnosis not present

## 2020-02-04 DIAGNOSIS — I6930 Unspecified sequelae of cerebral infarction: Secondary | ICD-10-CM | POA: Diagnosis not present

## 2020-02-05 DIAGNOSIS — I6930 Unspecified sequelae of cerebral infarction: Secondary | ICD-10-CM | POA: Diagnosis not present

## 2020-02-06 ENCOUNTER — Encounter: Payer: Self-pay | Admitting: Physical Medicine & Rehabilitation

## 2020-02-06 ENCOUNTER — Other Ambulatory Visit: Payer: Self-pay

## 2020-02-06 ENCOUNTER — Encounter: Payer: Medicaid Other | Attending: Registered Nurse | Admitting: Physical Medicine & Rehabilitation

## 2020-02-06 VITALS — BP 116/69 | HR 76 | Temp 98.1°F | Ht 67.0 in | Wt 164.6 lb

## 2020-02-06 DIAGNOSIS — G8111 Spastic hemiplegia affecting right dominant side: Secondary | ICD-10-CM

## 2020-02-06 DIAGNOSIS — E1165 Type 2 diabetes mellitus with hyperglycemia: Secondary | ICD-10-CM | POA: Insufficient documentation

## 2020-02-06 DIAGNOSIS — I6930 Unspecified sequelae of cerebral infarction: Secondary | ICD-10-CM | POA: Diagnosis not present

## 2020-02-06 DIAGNOSIS — I639 Cerebral infarction, unspecified: Secondary | ICD-10-CM | POA: Diagnosis present

## 2020-02-06 DIAGNOSIS — I10 Essential (primary) hypertension: Secondary | ICD-10-CM | POA: Diagnosis present

## 2020-02-06 DIAGNOSIS — M7501 Adhesive capsulitis of right shoulder: Secondary | ICD-10-CM | POA: Insufficient documentation

## 2020-02-06 DIAGNOSIS — G8191 Hemiplegia, unspecified affecting right dominant side: Secondary | ICD-10-CM | POA: Diagnosis present

## 2020-02-06 NOTE — Progress Notes (Signed)
Botox: Procedure Note Patient Name: Casey Petty DOB: 1960-09-10 MRN: [id number]  Date: 02/06/2020   Procedure: Botulinum toxin administration Guidance: EMG Diagnosis: Spastic Hemiparesis Attending: Delice Lesch, MD   Trade name: Botox ([id number])  Informed consent: Risks, benefits & options of the procedure are explained to the patient (and/or family). The patient elects to proceed with procedure. Risks include but are not limited to weakness, respiratory distress, dry mouth, ptosis, antibody formation, worsening of some areas of function. Benefits include decreased abnormal muscle tone, improved hygiene and positioning, decreased skin breakdown and, in some cases, decreased pain. Options include conservative management with oral antispasticity agents, phenol chemodenervation of nerve or at motor nerve branches. More invasive options include intrathecal balcofen adminstration for appropriate candidates. Surgical options may include tendon lengthening or transposition or, rarely, dorsal rhizotomy.   History/Physical Examination: 59 y.o. male with history of HTN presents for follow up for left basal ganglia stroke.  mAS Right  Shoulder abductions 1/4    Elbow flexors 3/4    Wrist flextors 2/4    Finger flexors 2/4     Ankle dorsiflexors 1+/4  Previous Treatments: Therapy/Range of motion Indication for guidance: Target active muscules  Procedure: Botulinum toxin was mixed with preservative free saline with a dilution of 1cc to 100 units. Targeted limb and muscles were identified. The skin was prepped with alcohol swabs and placement of needle tip in targeted muscle was confirmed using appropriate guidance. Prior to injection, positioning of needle tip outside of blood vessel was determined by pulling back on syringe plunger.  MUSCLE UNITS Right Mec Major:      20 units    Biceps:            100  FCR                 100  FCU                 100  FDS                 50  FDP                  50   Right Gastroc Med   40  Lastroc Lat      40  Complications: None. Patient jumped during procedure, however, needle was maintained with good EMG feedback prior to continuing with medication administration.  Plan: RTC 6 weeks Nicloe Frontera Anil Francie Keeling 9:06 AM

## 2020-02-07 DIAGNOSIS — I6930 Unspecified sequelae of cerebral infarction: Secondary | ICD-10-CM | POA: Diagnosis not present

## 2020-02-08 DIAGNOSIS — I6930 Unspecified sequelae of cerebral infarction: Secondary | ICD-10-CM | POA: Diagnosis not present

## 2020-02-10 DIAGNOSIS — I6930 Unspecified sequelae of cerebral infarction: Secondary | ICD-10-CM | POA: Diagnosis not present

## 2020-02-11 DIAGNOSIS — I6930 Unspecified sequelae of cerebral infarction: Secondary | ICD-10-CM | POA: Diagnosis not present

## 2020-02-12 DIAGNOSIS — I6930 Unspecified sequelae of cerebral infarction: Secondary | ICD-10-CM | POA: Diagnosis not present

## 2020-02-13 DIAGNOSIS — I6930 Unspecified sequelae of cerebral infarction: Secondary | ICD-10-CM | POA: Diagnosis not present

## 2020-02-14 DIAGNOSIS — I6930 Unspecified sequelae of cerebral infarction: Secondary | ICD-10-CM | POA: Diagnosis not present

## 2020-02-15 DIAGNOSIS — I6930 Unspecified sequelae of cerebral infarction: Secondary | ICD-10-CM | POA: Diagnosis not present

## 2020-02-17 DIAGNOSIS — I6930 Unspecified sequelae of cerebral infarction: Secondary | ICD-10-CM | POA: Diagnosis not present

## 2020-02-18 DIAGNOSIS — I6930 Unspecified sequelae of cerebral infarction: Secondary | ICD-10-CM | POA: Diagnosis not present

## 2020-02-19 DIAGNOSIS — I6930 Unspecified sequelae of cerebral infarction: Secondary | ICD-10-CM | POA: Diagnosis not present

## 2020-02-20 DIAGNOSIS — I6930 Unspecified sequelae of cerebral infarction: Secondary | ICD-10-CM | POA: Diagnosis not present

## 2020-02-21 DIAGNOSIS — I6930 Unspecified sequelae of cerebral infarction: Secondary | ICD-10-CM | POA: Diagnosis not present

## 2020-02-22 DIAGNOSIS — I6930 Unspecified sequelae of cerebral infarction: Secondary | ICD-10-CM | POA: Diagnosis not present

## 2020-02-24 DIAGNOSIS — I6930 Unspecified sequelae of cerebral infarction: Secondary | ICD-10-CM | POA: Diagnosis not present

## 2020-02-25 DIAGNOSIS — I6930 Unspecified sequelae of cerebral infarction: Secondary | ICD-10-CM | POA: Diagnosis not present

## 2020-02-26 DIAGNOSIS — I6930 Unspecified sequelae of cerebral infarction: Secondary | ICD-10-CM | POA: Diagnosis not present

## 2020-02-27 DIAGNOSIS — I6930 Unspecified sequelae of cerebral infarction: Secondary | ICD-10-CM | POA: Diagnosis not present

## 2020-02-28 DIAGNOSIS — I6930 Unspecified sequelae of cerebral infarction: Secondary | ICD-10-CM | POA: Diagnosis not present

## 2020-02-29 DIAGNOSIS — I6930 Unspecified sequelae of cerebral infarction: Secondary | ICD-10-CM | POA: Diagnosis not present

## 2020-03-02 DIAGNOSIS — I6930 Unspecified sequelae of cerebral infarction: Secondary | ICD-10-CM | POA: Diagnosis not present

## 2020-03-03 DIAGNOSIS — I6930 Unspecified sequelae of cerebral infarction: Secondary | ICD-10-CM | POA: Diagnosis not present

## 2020-03-04 DIAGNOSIS — I6930 Unspecified sequelae of cerebral infarction: Secondary | ICD-10-CM | POA: Diagnosis not present

## 2020-03-05 DIAGNOSIS — I6930 Unspecified sequelae of cerebral infarction: Secondary | ICD-10-CM | POA: Diagnosis not present

## 2020-03-06 DIAGNOSIS — I6930 Unspecified sequelae of cerebral infarction: Secondary | ICD-10-CM | POA: Diagnosis not present

## 2020-03-07 DIAGNOSIS — I6930 Unspecified sequelae of cerebral infarction: Secondary | ICD-10-CM | POA: Diagnosis not present

## 2020-03-09 DIAGNOSIS — I6930 Unspecified sequelae of cerebral infarction: Secondary | ICD-10-CM | POA: Diagnosis not present

## 2020-03-10 DIAGNOSIS — I6930 Unspecified sequelae of cerebral infarction: Secondary | ICD-10-CM | POA: Diagnosis not present

## 2020-03-11 DIAGNOSIS — I6930 Unspecified sequelae of cerebral infarction: Secondary | ICD-10-CM | POA: Diagnosis not present

## 2020-03-12 DIAGNOSIS — I6930 Unspecified sequelae of cerebral infarction: Secondary | ICD-10-CM | POA: Diagnosis not present

## 2020-03-13 DIAGNOSIS — I6930 Unspecified sequelae of cerebral infarction: Secondary | ICD-10-CM | POA: Diagnosis not present

## 2020-03-14 DIAGNOSIS — I6930 Unspecified sequelae of cerebral infarction: Secondary | ICD-10-CM | POA: Diagnosis not present

## 2020-03-16 DIAGNOSIS — I6930 Unspecified sequelae of cerebral infarction: Secondary | ICD-10-CM | POA: Diagnosis not present

## 2020-03-17 DIAGNOSIS — I6930 Unspecified sequelae of cerebral infarction: Secondary | ICD-10-CM | POA: Diagnosis not present

## 2020-03-18 DIAGNOSIS — I6930 Unspecified sequelae of cerebral infarction: Secondary | ICD-10-CM | POA: Diagnosis not present

## 2020-03-19 ENCOUNTER — Encounter: Payer: Self-pay | Admitting: Physical Medicine & Rehabilitation

## 2020-03-19 ENCOUNTER — Encounter: Payer: Medicaid Other | Attending: Registered Nurse | Admitting: Physical Medicine & Rehabilitation

## 2020-03-19 ENCOUNTER — Other Ambulatory Visit: Payer: Self-pay

## 2020-03-19 VITALS — BP 143/77 | HR 80 | Temp 98.5°F | Ht 67.0 in | Wt 165.0 lb

## 2020-03-19 DIAGNOSIS — G8929 Other chronic pain: Secondary | ICD-10-CM | POA: Diagnosis not present

## 2020-03-19 DIAGNOSIS — G8110 Spastic hemiplegia affecting unspecified side: Secondary | ICD-10-CM

## 2020-03-19 DIAGNOSIS — I10 Essential (primary) hypertension: Secondary | ICD-10-CM | POA: Insufficient documentation

## 2020-03-19 DIAGNOSIS — I639 Cerebral infarction, unspecified: Secondary | ICD-10-CM | POA: Diagnosis present

## 2020-03-19 DIAGNOSIS — G8191 Hemiplegia, unspecified affecting right dominant side: Secondary | ICD-10-CM | POA: Diagnosis present

## 2020-03-19 DIAGNOSIS — R269 Unspecified abnormalities of gait and mobility: Secondary | ICD-10-CM

## 2020-03-19 DIAGNOSIS — Z720 Tobacco use: Secondary | ICD-10-CM

## 2020-03-19 DIAGNOSIS — E1165 Type 2 diabetes mellitus with hyperglycemia: Secondary | ICD-10-CM

## 2020-03-19 DIAGNOSIS — M25511 Pain in right shoulder: Secondary | ICD-10-CM | POA: Diagnosis not present

## 2020-03-19 DIAGNOSIS — G8111 Spastic hemiplegia affecting right dominant side: Secondary | ICD-10-CM | POA: Diagnosis not present

## 2020-03-19 DIAGNOSIS — M7501 Adhesive capsulitis of right shoulder: Secondary | ICD-10-CM | POA: Diagnosis present

## 2020-03-19 DIAGNOSIS — I6930 Unspecified sequelae of cerebral infarction: Secondary | ICD-10-CM | POA: Diagnosis not present

## 2020-03-19 NOTE — Progress Notes (Signed)
Subjective:    Patient ID: Casey Petty, male    DOB: 05/16/1961, 59 y.o.   MRN: [id number]  HPI Male with history of HTN--no medicine 3 years presents for follow up for left basal ganglia stroke.  Last clinic visit on 02/06/2020.  He had Botulinum toxin on last visit. Significant other supplements history. Since that time, pt states he is doing HEP.  He is not taking Baclofen. He completed therapies.  He notes improvement in tone with injection. BP is relatively controlled. Denies falls. He has not obtained xray of shoulder.   Pain Inventory Average Pain 0 Pain Right Now 0 My pain is no pain  In the last 24 hours, has pain interfered with the following? General activity 0 Relation with others 0 Enjoyment of life 0 What TIME of day is your pain at its worst? no pain Sleep (in general) Good  Pain is worse with: no pain Pain improves with: no pain Relief from Meds: no pain meds  Mobility walk with assistance use a cane use a walker ability to climb steps?  no do you drive?  no use a wheelchair transfers alone  Function disabled: date disabled . I need assistance with the following:  bathing, meal prep, household duties and shopping  Neuro/Psych tremor tingling trouble walking spasms  Prior Studies Any changes since last visit?  no  Physicians involved in your care Any changes since last visit?  no   Family History  Problem Relation Age of Onset  . Heart attack Father   . Cancer Neg Hx    Social History   Socioeconomic History  . Marital status: Single    Spouse name: Not on file  . Number of children: Not on file  . Years of education: Not on file  . Highest education level: Not on file  Occupational History  . Not on file  Tobacco Use  . Smoking status: Current Every Day Smoker  . Smokeless tobacco: Never Used  . Tobacco comment: Smoking 4 cigs/day  Substance and Sexual Activity  . Alcohol use: Yes    Alcohol/week: 2.0 standard drinks     Types: 2 Standard drinks or equivalent per week  . Drug use: No  . Sexual activity: Never  Other Topics Concern  . Not on file  Social History Narrative  . Not on file   Social Determinants of Health   Financial Resource Strain:   . Difficulty of Paying Living Expenses:   Food Insecurity:   . Worried About Charity fundraiser in the Last Year:   . Arboriculturist in the Last Year:   Transportation Needs:   . Film/video editor (Medical):   Marland Kitchen Lack of Transportation (Non-Medical):   Physical Activity:   . Days of Exercise per Week:   . Minutes of Exercise per Session:   Stress:   . Feeling of Stress :   Social Connections:   . Frequency of Communication with Friends and Family:   . Frequency of Social Gatherings with Friends and Family:   . Attends Religious Services:   . Active Member of Clubs or Organizations:   . Attends Archivist Meetings:   Marland Kitchen Marital Status:    Past Surgical History:  Procedure Laterality Date  . NO PAST SURGERIES     Past Medical History:  Diagnosis Date  . Hypertension    BP (!) 143/77   Pulse 80   Temp 98.5 F (36.9 C)   Ht 5'  7" (1.702 m)   Wt 165 lb (74.8 kg)   SpO2 97%   BMI 25.84 kg/m   Opioid Risk Score:   Fall Risk Score:  `1  Depression screen PHQ 2/9  Depression screen [facility] 2/9 02/06/2020 12/19/2019 06/20/2019 05/01/2019 03/27/2019 06/27/2014  Decreased Interest 0 0 0 0 0 0  Down, Depressed, Hopeless 0 0 0 0 0 0  PHQ - 2 Score 0 0 0 0 0 0   Review of Systems  Constitutional: Negative.   HENT: Negative.   Eyes: Negative.   Respiratory: Negative.   Cardiovascular: Negative.   Gastrointestinal: Negative.   Endocrine: Negative.   Genitourinary: Negative.   Musculoskeletal: Positive for gait problem.       Spasms/ shoulder pain on stroke affected side  Skin: Negative.   Allergic/Immunologic: Negative.   Neurological: Positive for tremors and weakness.  Hematological: Negative.   Psychiatric/Behavioral:  Negative.       Objective:   Physical Exam  Constitutional: NAD.  Respiratory: Normal effort.   Musc:  Right shoulder with pain with ER and shoulder abduction Gait: Wheelchair due to balance deficits Neurologic: Alert Dysarthria, stable Motor:  RUE: Shoulder abduction 3/5 (pain inhibition), elbow flexion 2+/5, elbow extension 4-/5, handgrip 2/5 with apraxia Right lower extremity: Hip flexion, knee extension 4/5, ankle dorsiflexion 0/5 MAS: right shoulder abdcutors 1+/4, elbow flexors 2/4, wrist flexors 2/4, finger flexors 1+/4, ankle dorsiflexors 1+/4     Assessment & Plan:  Male with history of HTN--no medicine 3 years presents for follow up for left basal ganglia stroke.  1. 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   Continue HEP  Increased Baclofen 30 TID, unclear if patient is taking, encouraged compliance again  Follow up with Neurology, needs appointment, reminded again x5 - information provided  Botulinum toxin injection, will change to dysport:   Right  Mec Major: 50 units        Biceps: 250    FCR  250    FCU  250    FDS  200    FDP  100    Right  Gastroc Med 50    Lastroc Lat 50   2. HTN:   Mildly elevated, states normally within better control  3. Gait abnormality  Cont HEP  Continue ambulation with AFO  4. Tobacco abuse  Now 1/day, encouraged abstinence x6  5. Adhesive capsulitis  Some benefit with with intraarticular injection on 3/1  Continue Voltaren gel  Will order xray of shoulder, reminded

## 2020-03-20 DIAGNOSIS — I6930 Unspecified sequelae of cerebral infarction: Secondary | ICD-10-CM | POA: Diagnosis not present

## 2020-03-21 DIAGNOSIS — I6930 Unspecified sequelae of cerebral infarction: Secondary | ICD-10-CM | POA: Diagnosis not present

## 2020-03-23 DIAGNOSIS — I6930 Unspecified sequelae of cerebral infarction: Secondary | ICD-10-CM | POA: Diagnosis not present

## 2020-03-24 DIAGNOSIS — I6930 Unspecified sequelae of cerebral infarction: Secondary | ICD-10-CM | POA: Diagnosis not present

## 2020-03-25 DIAGNOSIS — I6930 Unspecified sequelae of cerebral infarction: Secondary | ICD-10-CM | POA: Diagnosis not present

## 2020-03-26 DIAGNOSIS — I6930 Unspecified sequelae of cerebral infarction: Secondary | ICD-10-CM | POA: Diagnosis not present

## 2020-03-27 DIAGNOSIS — I6930 Unspecified sequelae of cerebral infarction: Secondary | ICD-10-CM | POA: Diagnosis not present

## 2020-03-28 DIAGNOSIS — I6930 Unspecified sequelae of cerebral infarction: Secondary | ICD-10-CM | POA: Diagnosis not present

## 2020-03-30 DIAGNOSIS — I6930 Unspecified sequelae of cerebral infarction: Secondary | ICD-10-CM | POA: Diagnosis not present

## 2020-03-31 DIAGNOSIS — I6930 Unspecified sequelae of cerebral infarction: Secondary | ICD-10-CM | POA: Diagnosis not present

## 2020-04-01 DIAGNOSIS — R69 Illness, unspecified: Secondary | ICD-10-CM | POA: Diagnosis not present

## 2020-04-01 DIAGNOSIS — I6930 Unspecified sequelae of cerebral infarction: Secondary | ICD-10-CM | POA: Diagnosis not present

## 2020-04-01 MED FILL — ATORVASTATIN 80 MG TABLET: 80 | 90 days supply | Qty: 90 | Fill #3

## 2020-04-01 MED FILL — EZETIMIBE 10 MG TABS: 10 | 90 days supply | Qty: 90 | Fill #1

## 2020-04-01 MED FILL — hydrALAZINE HCL 100 MG TABS: 100 | 90 days supply | Qty: 270 | Fill #3

## 2020-04-01 MED FILL — AMLODIPINE BESYLATE 10 MG T: 10 | 90 days supply | Qty: 90 | Fill #3

## 2020-04-02 DIAGNOSIS — R69 Illness, unspecified: Secondary | ICD-10-CM | POA: Diagnosis not present

## 2020-04-02 DIAGNOSIS — I6930 Unspecified sequelae of cerebral infarction: Secondary | ICD-10-CM | POA: Diagnosis not present

## 2020-04-03 DIAGNOSIS — R69 Illness, unspecified: Secondary | ICD-10-CM | POA: Diagnosis not present

## 2020-04-03 DIAGNOSIS — I6930 Unspecified sequelae of cerebral infarction: Secondary | ICD-10-CM | POA: Diagnosis not present

## 2020-04-04 DIAGNOSIS — I6930 Unspecified sequelae of cerebral infarction: Secondary | ICD-10-CM | POA: Diagnosis not present

## 2020-04-04 DIAGNOSIS — R69 Illness, unspecified: Secondary | ICD-10-CM | POA: Diagnosis not present

## 2020-04-06 DIAGNOSIS — R69 Illness, unspecified: Secondary | ICD-10-CM | POA: Diagnosis not present

## 2020-04-06 DIAGNOSIS — I6930 Unspecified sequelae of cerebral infarction: Secondary | ICD-10-CM | POA: Diagnosis not present

## 2020-04-07 DIAGNOSIS — R69 Illness, unspecified: Secondary | ICD-10-CM | POA: Diagnosis not present

## 2020-04-07 DIAGNOSIS — I6930 Unspecified sequelae of cerebral infarction: Secondary | ICD-10-CM | POA: Diagnosis not present

## 2020-04-08 DIAGNOSIS — I6930 Unspecified sequelae of cerebral infarction: Secondary | ICD-10-CM | POA: Diagnosis not present

## 2020-04-09 DIAGNOSIS — I6930 Unspecified sequelae of cerebral infarction: Secondary | ICD-10-CM | POA: Diagnosis not present

## 2020-04-10 DIAGNOSIS — I6930 Unspecified sequelae of cerebral infarction: Secondary | ICD-10-CM | POA: Diagnosis not present

## 2020-04-11 DIAGNOSIS — I6930 Unspecified sequelae of cerebral infarction: Secondary | ICD-10-CM | POA: Diagnosis not present

## 2020-04-13 DIAGNOSIS — I6930 Unspecified sequelae of cerebral infarction: Secondary | ICD-10-CM | POA: Diagnosis not present

## 2020-04-14 DIAGNOSIS — I6930 Unspecified sequelae of cerebral infarction: Secondary | ICD-10-CM | POA: Diagnosis not present

## 2020-04-15 DIAGNOSIS — I6930 Unspecified sequelae of cerebral infarction: Secondary | ICD-10-CM | POA: Diagnosis not present

## 2020-04-16 DIAGNOSIS — I6930 Unspecified sequelae of cerebral infarction: Secondary | ICD-10-CM | POA: Diagnosis not present

## 2020-04-17 DIAGNOSIS — I6930 Unspecified sequelae of cerebral infarction: Secondary | ICD-10-CM | POA: Diagnosis not present

## 2020-04-18 DIAGNOSIS — I6930 Unspecified sequelae of cerebral infarction: Secondary | ICD-10-CM | POA: Diagnosis not present

## 2020-04-20 DIAGNOSIS — I6930 Unspecified sequelae of cerebral infarction: Secondary | ICD-10-CM | POA: Diagnosis not present

## 2020-04-21 DIAGNOSIS — I6930 Unspecified sequelae of cerebral infarction: Secondary | ICD-10-CM | POA: Diagnosis not present

## 2020-04-22 DIAGNOSIS — I6930 Unspecified sequelae of cerebral infarction: Secondary | ICD-10-CM | POA: Diagnosis not present

## 2020-04-22 MED FILL — CARVEDILOL 12.5 MG TABLET: 12.5 | 90 days supply | Qty: 180 | Fill #3

## 2020-04-22 MED FILL — AMLODIPINE BESYLATE 10 MG T: 10 | 90 days supply | Qty: 90 | Fill #3

## 2020-04-22 MED FILL — ATORVASTATIN 80 MG TABLET: 80 | 90 days supply | Qty: 90 | Fill #3

## 2020-04-22 MED FILL — hydrALAZINE HCL 100 MG TABS: 100 | 90 days supply | Qty: 270 | Fill #3

## 2020-04-22 MED FILL — EZETIMIBE 10 MG TABS: 10 | 90 days supply | Qty: 90 | Fill #1

## 2020-04-23 DIAGNOSIS — I6930 Unspecified sequelae of cerebral infarction: Secondary | ICD-10-CM | POA: Diagnosis not present

## 2020-04-24 DIAGNOSIS — I6930 Unspecified sequelae of cerebral infarction: Secondary | ICD-10-CM | POA: Diagnosis not present

## 2020-04-25 DIAGNOSIS — I6930 Unspecified sequelae of cerebral infarction: Secondary | ICD-10-CM | POA: Diagnosis not present

## 2020-04-27 DIAGNOSIS — I6930 Unspecified sequelae of cerebral infarction: Secondary | ICD-10-CM | POA: Diagnosis not present

## 2020-04-28 DIAGNOSIS — I6930 Unspecified sequelae of cerebral infarction: Secondary | ICD-10-CM | POA: Diagnosis not present

## 2020-04-29 DIAGNOSIS — I6930 Unspecified sequelae of cerebral infarction: Secondary | ICD-10-CM | POA: Diagnosis not present

## 2020-04-30 DIAGNOSIS — I6930 Unspecified sequelae of cerebral infarction: Secondary | ICD-10-CM | POA: Diagnosis not present

## 2020-05-01 DIAGNOSIS — I6930 Unspecified sequelae of cerebral infarction: Secondary | ICD-10-CM | POA: Diagnosis not present

## 2020-05-02 DIAGNOSIS — I6930 Unspecified sequelae of cerebral infarction: Secondary | ICD-10-CM | POA: Diagnosis not present

## 2020-05-04 ENCOUNTER — Other Ambulatory Visit: Payer: Self-pay

## 2020-05-04 ENCOUNTER — Ambulatory Visit (HOSPITAL_COMMUNITY)
Admission: RE | Admit: 2020-05-04 | Discharge: 2020-05-04 | Disposition: A | Discharge status: Home / Self Care | Payer: Medicaid Other | Source: Ambulatory Visit | Attending: Physical Medicine & Rehabilitation | Admitting: Physical Medicine & Rehabilitation

## 2020-05-04 DIAGNOSIS — M7501 Adhesive capsulitis of right shoulder: Secondary | ICD-10-CM | POA: Diagnosis present

## 2020-05-04 DIAGNOSIS — I6930 Unspecified sequelae of cerebral infarction: Secondary | ICD-10-CM | POA: Diagnosis not present

## 2020-05-05 DIAGNOSIS — I6930 Unspecified sequelae of cerebral infarction: Secondary | ICD-10-CM | POA: Diagnosis not present

## 2020-05-06 DIAGNOSIS — I6930 Unspecified sequelae of cerebral infarction: Secondary | ICD-10-CM | POA: Diagnosis not present

## 2020-05-07 DIAGNOSIS — I6930 Unspecified sequelae of cerebral infarction: Secondary | ICD-10-CM | POA: Diagnosis not present

## 2020-05-08 DIAGNOSIS — I6930 Unspecified sequelae of cerebral infarction: Secondary | ICD-10-CM | POA: Diagnosis not present

## 2020-05-09 DIAGNOSIS — I6930 Unspecified sequelae of cerebral infarction: Secondary | ICD-10-CM | POA: Diagnosis not present

## 2020-05-11 ENCOUNTER — Encounter: Payer: Self-pay | Admitting: Physical Medicine & Rehabilitation

## 2020-05-11 ENCOUNTER — Encounter: Payer: Medicaid Other | Attending: Registered Nurse | Admitting: Physical Medicine & Rehabilitation

## 2020-05-11 ENCOUNTER — Other Ambulatory Visit: Payer: Self-pay

## 2020-05-11 VITALS — BP 139/78 | HR 85 | Temp 98.8°F | Ht 67.0 in | Wt 161.0 lb

## 2020-05-11 DIAGNOSIS — I6930 Unspecified sequelae of cerebral infarction: Secondary | ICD-10-CM | POA: Diagnosis not present

## 2020-05-11 DIAGNOSIS — G8111 Spastic hemiplegia affecting right dominant side: Secondary | ICD-10-CM | POA: Insufficient documentation

## 2020-05-11 NOTE — Progress Notes (Signed)
Dysport: Procedure Note Patient Name: Almer Bushey DOB: 1960/08/26 MRN: [id number]  Date: 05/11/20  Procedure: Botulinum toxin administration Guidance: EMG Diagnosis: Spastic Hemiparesis Attending: Delice Lesch, MD    Informed consent: Risks, benefits & options of the procedure are explained to the patient (and/or family). The patient elects to proceed with procedure. Risks include but are not limited to weakness, respiratory distress, dry mouth, ptosis, antibody formation, worsening of some areas of function. Benefits include decreased abnormal muscle tone, improved hygiene and positioning, decreased skin breakdown and, in some cases, decreased pain. Options include conservative management with oral antispasticity agents, phenol chemodenervation of nerve or at motor nerve branches. More invasive options include intrathecal balcofen adminstration for appropriate candidates. Surgical options may include tendon lengthening or transposition or, rarely, dorsal rhizotomy.   History/Physical Examination: 59 y.o. male with history of HTN presents for follow up for left basal ganglia stroke.   mAS Right       Shoulder abductions 1/4                                     Elbow flexors 3/4                                     Wrist flextors 3/4                                     Finger flexors 1+/4                                      Ankle dorsiflexors 1+/4  Previous Treatments: Therapy/Range of motion Indication for guidance: Target active muscules  Procedure: Botulinum toxin was mixed with preservative free saline with a dilution of 0.5cc to 100 units. Targeted limb and muscles were identified. The skin was prepped with alcohol swabs and placement of needle tip in targeted muscle was confirmed using appropriate guidance. Prior to injection, positioning of needle tip outside of blood vessel was determined by pulling back on syringe plunger.  MUSCLE UNITS                          Right    Mec  Major:      50 units                                                                          Biceps:            300                                     FCR                 250  FCU                 250                                     FDS                 250                                     FDP                 100                          Right    Gastroc Med   50                                     Lastroc Lat      50   Total units used: 2505 Complications: None Plan: 6 weeks  Grainne Knights Anil Rilla Buckman 9:07 AM

## 2020-05-12 DIAGNOSIS — I6930 Unspecified sequelae of cerebral infarction: Secondary | ICD-10-CM | POA: Diagnosis not present

## 2020-05-13 DIAGNOSIS — I6930 Unspecified sequelae of cerebral infarction: Secondary | ICD-10-CM | POA: Diagnosis not present

## 2020-05-14 DIAGNOSIS — I6930 Unspecified sequelae of cerebral infarction: Secondary | ICD-10-CM | POA: Diagnosis not present

## 2020-05-15 DIAGNOSIS — I6930 Unspecified sequelae of cerebral infarction: Secondary | ICD-10-CM | POA: Diagnosis not present

## 2020-05-16 DIAGNOSIS — R69 Illness, unspecified: Secondary | ICD-10-CM | POA: Diagnosis not present

## 2020-05-16 DIAGNOSIS — I6930 Unspecified sequelae of cerebral infarction: Secondary | ICD-10-CM | POA: Diagnosis not present

## 2020-05-18 DIAGNOSIS — I6930 Unspecified sequelae of cerebral infarction: Secondary | ICD-10-CM | POA: Diagnosis not present

## 2020-05-19 DIAGNOSIS — I6930 Unspecified sequelae of cerebral infarction: Secondary | ICD-10-CM | POA: Diagnosis not present

## 2020-05-20 DIAGNOSIS — R69 Illness, unspecified: Secondary | ICD-10-CM | POA: Diagnosis not present

## 2020-05-20 DIAGNOSIS — I6930 Unspecified sequelae of cerebral infarction: Secondary | ICD-10-CM | POA: Diagnosis not present

## 2020-05-21 DIAGNOSIS — I6930 Unspecified sequelae of cerebral infarction: Secondary | ICD-10-CM | POA: Diagnosis not present

## 2020-05-22 DIAGNOSIS — I6930 Unspecified sequelae of cerebral infarction: Secondary | ICD-10-CM | POA: Diagnosis not present

## 2020-05-22 DIAGNOSIS — R69 Illness, unspecified: Secondary | ICD-10-CM | POA: Diagnosis not present

## 2020-05-23 DIAGNOSIS — R69 Illness, unspecified: Secondary | ICD-10-CM | POA: Diagnosis not present

## 2020-05-23 DIAGNOSIS — I6930 Unspecified sequelae of cerebral infarction: Secondary | ICD-10-CM | POA: Diagnosis not present

## 2020-05-25 DIAGNOSIS — I6930 Unspecified sequelae of cerebral infarction: Secondary | ICD-10-CM | POA: Diagnosis not present

## 2020-05-26 DIAGNOSIS — I6930 Unspecified sequelae of cerebral infarction: Secondary | ICD-10-CM | POA: Diagnosis not present

## 2020-05-27 DIAGNOSIS — I6930 Unspecified sequelae of cerebral infarction: Secondary | ICD-10-CM | POA: Diagnosis not present

## 2020-05-28 DIAGNOSIS — I6930 Unspecified sequelae of cerebral infarction: Secondary | ICD-10-CM | POA: Diagnosis not present

## 2020-05-29 DIAGNOSIS — R69 Illness, unspecified: Secondary | ICD-10-CM | POA: Diagnosis not present

## 2020-05-29 DIAGNOSIS — I6930 Unspecified sequelae of cerebral infarction: Secondary | ICD-10-CM | POA: Diagnosis not present

## 2020-05-30 DIAGNOSIS — I6930 Unspecified sequelae of cerebral infarction: Secondary | ICD-10-CM | POA: Diagnosis not present

## 2020-06-01 DIAGNOSIS — R69 Illness, unspecified: Secondary | ICD-10-CM | POA: Diagnosis not present

## 2020-06-02 DIAGNOSIS — R69 Illness, unspecified: Secondary | ICD-10-CM | POA: Diagnosis not present

## 2020-06-03 DIAGNOSIS — R69 Illness, unspecified: Secondary | ICD-10-CM | POA: Diagnosis not present

## 2020-06-04 DIAGNOSIS — R69 Illness, unspecified: Secondary | ICD-10-CM | POA: Diagnosis not present

## 2020-06-05 DIAGNOSIS — R69 Illness, unspecified: Secondary | ICD-10-CM | POA: Diagnosis not present

## 2020-06-06 DIAGNOSIS — R69 Illness, unspecified: Secondary | ICD-10-CM | POA: Diagnosis not present

## 2020-06-08 DIAGNOSIS — R69 Illness, unspecified: Secondary | ICD-10-CM | POA: Diagnosis not present

## 2020-06-09 DIAGNOSIS — R69 Illness, unspecified: Secondary | ICD-10-CM | POA: Diagnosis not present

## 2020-06-10 DIAGNOSIS — R69 Illness, unspecified: Secondary | ICD-10-CM | POA: Diagnosis not present

## 2020-06-11 DIAGNOSIS — R69 Illness, unspecified: Secondary | ICD-10-CM | POA: Diagnosis not present

## 2020-06-12 DIAGNOSIS — R69 Illness, unspecified: Secondary | ICD-10-CM | POA: Diagnosis not present

## 2020-06-13 DIAGNOSIS — R69 Illness, unspecified: Secondary | ICD-10-CM | POA: Diagnosis not present

## 2020-06-15 DIAGNOSIS — R69 Illness, unspecified: Secondary | ICD-10-CM | POA: Diagnosis not present

## 2020-06-16 DIAGNOSIS — R69 Illness, unspecified: Secondary | ICD-10-CM | POA: Diagnosis not present

## 2020-06-17 DIAGNOSIS — R69 Illness, unspecified: Secondary | ICD-10-CM | POA: Diagnosis not present

## 2020-06-18 DIAGNOSIS — R69 Illness, unspecified: Secondary | ICD-10-CM | POA: Diagnosis not present

## 2020-06-19 DIAGNOSIS — R69 Illness, unspecified: Secondary | ICD-10-CM | POA: Diagnosis not present

## 2020-06-20 DIAGNOSIS — R69 Illness, unspecified: Secondary | ICD-10-CM | POA: Diagnosis not present

## 2020-06-22 ENCOUNTER — Other Ambulatory Visit: Payer: Self-pay | Admitting: Physical Medicine & Rehabilitation

## 2020-06-22 ENCOUNTER — Encounter: Payer: Self-pay | Admitting: Physical Medicine & Rehabilitation

## 2020-06-22 ENCOUNTER — Encounter: Payer: Medicaid Other | Attending: Registered Nurse | Admitting: Physical Medicine & Rehabilitation

## 2020-06-22 ENCOUNTER — Other Ambulatory Visit: Payer: Self-pay

## 2020-06-22 VITALS — BP 147/79 | HR 86 | Temp 98.0°F | Ht 67.0 in | Wt 161.2 lb

## 2020-06-22 DIAGNOSIS — Z720 Tobacco use: Secondary | ICD-10-CM | POA: Insufficient documentation

## 2020-06-22 DIAGNOSIS — G8929 Other chronic pain: Secondary | ICD-10-CM | POA: Diagnosis present

## 2020-06-22 DIAGNOSIS — R269 Unspecified abnormalities of gait and mobility: Secondary | ICD-10-CM | POA: Insufficient documentation

## 2020-06-22 DIAGNOSIS — G8111 Spastic hemiplegia affecting right dominant side: Secondary | ICD-10-CM | POA: Diagnosis present

## 2020-06-22 DIAGNOSIS — R69 Illness, unspecified: Secondary | ICD-10-CM | POA: Diagnosis not present

## 2020-06-22 DIAGNOSIS — I6381 Other cerebral infarction due to occlusion or stenosis of small artery: Secondary | ICD-10-CM | POA: Diagnosis present

## 2020-06-22 DIAGNOSIS — G8110 Spastic hemiplegia affecting unspecified side: Secondary | ICD-10-CM | POA: Diagnosis present

## 2020-06-22 DIAGNOSIS — M7501 Adhesive capsulitis of right shoulder: Secondary | ICD-10-CM | POA: Insufficient documentation

## 2020-06-22 DIAGNOSIS — M25511 Pain in right shoulder: Secondary | ICD-10-CM | POA: Insufficient documentation

## 2020-06-22 MED ORDER — BACLOFEN 20 MG PO TABS: 30.0000 mg | ORAL_TABLET | Freq: Three times a day (TID) | ORAL | 1 refills | Status: DC

## 2020-06-22 MED FILL — BACLOFEN 20 MG TABS: 20 | 20 days supply | Qty: 90 | Fill #0

## 2020-06-22 NOTE — Progress Notes (Signed)
Subjective:    Patient ID: Casey Petty, male    DOB: 01-Apr-1961, 59 y.o.   MRN: [id number]  HPI Male with history of HTN--no medicine 3 years presents for follow up for left basal ganglia stroke.  Last clinic visit on 05/11/20.  Since that time, pt states he is not using Baclofen. He still has not followed up with Neuro. BP is slightly elevated, states normally better controlled. Denies falls. Using cane at home.  Continues to smoke 1 cig/day. He obtained xray of shoulder.   Pain Inventory Average Pain 6 Pain Right Now 6 My pain is intermittent, sharp, stabbing and in the right shoulder. Right side of body shakes at night.   In the last 24 hours, has pain interfered with the following? General activity 3 Relation with others 0 Enjoyment of life 0 What TIME of day is your pain at its worst? night Sleep (in general) Poor  Pain is worse with: when the right arm is raised. Pain improves with: rest the right arm. Relief from Meds: no pain meds     Family History  Problem Relation Age of Onset  . Heart attack Father   . Cancer Neg Hx    Social History   Socioeconomic History  . Marital status: Single    Spouse name: Not on file  . Number of children: Not on file  . Years of education: Not on file  . Highest education level: Not on file  Occupational History  . Not on file  Tobacco Use  . Smoking status: Current Every Day Smoker  . Smokeless tobacco: Never Used  . Tobacco comment: Smoking 4 cigs/day  Vaping Use  . Vaping Use: Never used  Substance and Sexual Activity  . Alcohol use: Yes    Alcohol/week: 2.0 standard drinks    Types: 2 Standard drinks or equivalent per week  . Drug use: No  . Sexual activity: Never  Other Topics Concern  . Not on file  Social History Narrative  . Not on file   Social Determinants of Health   Financial Resource Strain:   . Difficulty of Paying Living Expenses: Not on file  Food Insecurity:   . Worried About Sales executive in the Last Year: Not on file  . Ran Out of Food in the Last Year: Not on file  Transportation Needs:   . Lack of Transportation (Medical): Not on file  . Lack of Transportation (Non-Medical): Not on file  Physical Activity:   . Days of Exercise per Week: Not on file  . Minutes of Exercise per Session: Not on file  Stress:   . Feeling of Stress : Not on file  Social Connections:   . Frequency of Communication with Friends and Family: Not on file  . Frequency of Social Gatherings with Friends and Family: Not on file  . Attends Religious Services: Not on file  . Active Member of Clubs or Organizations: Not on file  . Attends Archivist Meetings: Not on file  . Marital Status: Not on file   Past Surgical History:  Procedure Laterality Date  . NO PAST SURGERIES     Past Medical History:  Diagnosis Date  . Hypertension    BP (!) 147/79   Pulse 86   Temp 98 F (36.7 C)   Ht 5\' 7"  (1.702 m)   Wt 161 lb 3.2 oz (73.1 kg)   SpO2 96%   BMI 25.25 kg/m   Opioid Risk Score:  Fall Risk Score:  `1  Depression screen PHQ 2/9  Depression screen [facility] 2/9 06/22/2020 02/06/2020 12/19/2019 06/20/2019 05/01/2019 03/27/2019 06/27/2014  Decreased Interest 0 0 0 0 0 0 0  Down, Depressed, Hopeless 0 0 0 0 0 0 0  PHQ - 2 Score 0 0 0 0 0 0 0   Review of Systems  Constitutional: Negative.   HENT: Negative.   Eyes: Negative.   Respiratory: Negative.   Cardiovascular: Negative.   Gastrointestinal: Negative.   Endocrine: Negative.   Genitourinary: Negative.   Musculoskeletal: Positive for gait problem.       Spasms/ shoulder pain on stroke affected side  Skin: Negative.   Allergic/Immunologic: Negative.   Neurological: Positive for tremors and weakness.  Hematological: Negative.   Psychiatric/Behavioral: Negative.       Objective:   Physical Exam  Constitutional: No distress . Vital signs reviewed. HENT: Normocephalic.  Atraumatic. Eyes: EOMI. No  discharge. Cardiovascular: No JVD.  RRR. Respiratory: Normal effort.  No stridor.  Bilateral clear to auscultation. GI: Non-distended.  BS +. Skin: Warm and dry.  Intact. Psych: Normal mood.  Normal behavior. Musc:  Right shoulder with limitation in ER, shoulder abduction improved. Gait: Wheelchair due to balance deficits Neurologic: Alert Dysarthria, unchanged Motor:  RUE: Shoulder abduction 3/5 (pain inhibition), elbow flexion 2+/5, elbow extension 4-/5, handgrip 2/5 with apraxia Right lower extremity: Hip flexion, knee extension 4/5, ankle dorsiflexion 0/5 MAS: right shoulder abdcutors 1/4, elbow flexors 1+/4, wrist flexors 1+/4, finger flexors 0/4, ankle dorsiflexors 0/4    Assessment & Plan:  Male with history of HTN--no medicine 3 years presents for follow up for left basal ganglia stroke.  1. Right hemiparesis, now with spasticity and functional deficitssecondary to left subcortical lacunar infarct.             Continue  HEP             Increased Baclofen 30 TID, unclear if patient is taking, discussed again, ordered again.             Follow up with Neurology, needs appointment, reminded again x6 - information provided             Continue dysport:                         Right    Mec Major:      50 units                                                                          Biceps:            250                                     FCR                 250, increase to 350                                     FCU  250                                     FDS                 200                                     FDP                 100                          Right    Gastroc Med   50                                     Lastroc Lat      50   2. HTN:              Elevated today, states normally within better control  3. Gait abnormality             Cont HEP             Continue ambulation with AFO, encouraged new foot ware given plantar shoe  breakdown  4. Tobacco abuse             Now 1/day, encouraged abstinence x7  5. Adhesive capsulitis             Some benefit with with intraarticular injection on 3/1, will schedule again             Continue Voltaren gel             Xray of shoulder, reviewed, unremarkable

## 2020-06-23 DIAGNOSIS — R69 Illness, unspecified: Secondary | ICD-10-CM | POA: Diagnosis not present

## 2020-06-24 DIAGNOSIS — R69 Illness, unspecified: Secondary | ICD-10-CM | POA: Diagnosis not present

## 2020-06-25 DIAGNOSIS — R69 Illness, unspecified: Secondary | ICD-10-CM | POA: Diagnosis not present

## 2020-06-26 DIAGNOSIS — R69 Illness, unspecified: Secondary | ICD-10-CM | POA: Diagnosis not present

## 2020-06-27 DIAGNOSIS — R69 Illness, unspecified: Secondary | ICD-10-CM | POA: Diagnosis not present

## 2020-06-29 DIAGNOSIS — R69 Illness, unspecified: Secondary | ICD-10-CM | POA: Diagnosis not present

## 2020-06-30 DIAGNOSIS — R69 Illness, unspecified: Secondary | ICD-10-CM | POA: Diagnosis not present

## 2020-07-01 DIAGNOSIS — R69 Illness, unspecified: Secondary | ICD-10-CM | POA: Diagnosis not present

## 2020-07-02 DIAGNOSIS — R69 Illness, unspecified: Secondary | ICD-10-CM | POA: Diagnosis not present

## 2020-07-03 DIAGNOSIS — R69 Illness, unspecified: Secondary | ICD-10-CM | POA: Diagnosis not present

## 2020-07-04 DIAGNOSIS — R69 Illness, unspecified: Secondary | ICD-10-CM | POA: Diagnosis not present

## 2020-07-06 DIAGNOSIS — R69 Illness, unspecified: Secondary | ICD-10-CM | POA: Diagnosis not present

## 2020-07-07 DIAGNOSIS — R69 Illness, unspecified: Secondary | ICD-10-CM | POA: Diagnosis not present

## 2020-07-08 ENCOUNTER — Telehealth: Payer: Self-pay | Admitting: Family Medicine

## 2020-07-08 DIAGNOSIS — R69 Illness, unspecified: Secondary | ICD-10-CM | POA: Diagnosis not present

## 2020-07-08 NOTE — Telephone Encounter (Signed)
Incoming fax received for patient for personal care services. Patient's last appointment was 05/2019. Placed in PCP box. Please follow up.

## 2020-07-08 NOTE — Telephone Encounter (Signed)
Pt verified. Spoke w/pt advised that in order for forms to be completed he must have appt., last appt 05/2019. Pt stated that he already has PCS services, so he does not need form filled out and declined scheduling a visit w/provider at this time

## 2020-07-09 DIAGNOSIS — R69 Illness, unspecified: Secondary | ICD-10-CM | POA: Diagnosis not present

## 2020-07-10 DIAGNOSIS — R69 Illness, unspecified: Secondary | ICD-10-CM | POA: Diagnosis not present

## 2020-07-11 DIAGNOSIS — R69 Illness, unspecified: Secondary | ICD-10-CM | POA: Diagnosis not present

## 2020-07-13 ENCOUNTER — Ambulatory Visit: Payer: Medicaid Other | Admitting: Adult Health

## 2020-07-13 ENCOUNTER — Other Ambulatory Visit: Payer: Self-pay

## 2020-07-13 ENCOUNTER — Encounter: Payer: Self-pay | Admitting: Adult Health

## 2020-07-13 VITALS — BP 126/74 | HR 76 | Ht 67.0 in | Wt 165.0 lb

## 2020-07-13 DIAGNOSIS — R69 Illness, unspecified: Secondary | ICD-10-CM | POA: Diagnosis not present

## 2020-07-13 DIAGNOSIS — I6381 Other cerebral infarction due to occlusion or stenosis of small artery: Secondary | ICD-10-CM | POA: Diagnosis not present

## 2020-07-13 DIAGNOSIS — E785 Hyperlipidemia, unspecified: Secondary | ICD-10-CM | POA: Diagnosis not present

## 2020-07-13 DIAGNOSIS — I10 Essential (primary) hypertension: Secondary | ICD-10-CM

## 2020-07-13 DIAGNOSIS — E119 Type 2 diabetes mellitus without complications: Secondary | ICD-10-CM | POA: Diagnosis not present

## 2020-07-13 NOTE — Patient Instructions (Addendum)
Continue to follow with Dr. Posey Pronto for right-sided spasticity post stroke  At the beginning of next year, I will place orders to restart physical and occupation therapy as discussed  Continue aspirin 81 mg daily  and atorvastatin 80 mg daily and Zetia for secondary stroke prevention  Repeat lipid panel and A1c -if cholesterol levels remain uncontrolled, may need to consider PCSK9 inhibitor (Repatha and Praluent) which is an injectable medication given once or twice monthly for more adequate cholesterol management  Ensure protein follow up visits with PCP regarding cholesterol, blood pressure and diabetes management  Maintain strict control of hypertension with blood pressure goal below 130/90, diabetes with hemoglobin A1c goal below 7.0% and cholesterol with LDL cholesterol (bad cholesterol) goal below 70 mg/dL.      Followup in the future with me in 6 months or call earlier if needed      Thank you for coming to see [place] at [facility]. I hope we have been able to provide you high quality care today.  You may receive a patient satisfaction survey over the next few weeks. We would appreciate your feedback and comments so that we may continue to improve ourselves and the health of our patients.

## 2020-07-13 NOTE — Progress Notes (Signed)
I agree with the above plan 

## 2020-07-13 NOTE — Progress Notes (Signed)
[facility] [address] [place]. [place] [address] [phone]       OFFICE FOLLOW UP NOTE  Mr. Casey Casey Petty Date of Birth:  02-Aug-1961 Medical Record Number:  [id number]   Reason for Referral: stroke follow up    CHIEF COMPLAINT:  Chief Complaint  Patient presents with  . Follow-up    rm 9, with partner, pt has no concerns today, in wheelchair    HPI:  Today, 07/13/2020, Mr. Casey Casey Petty returns for 35-monthstroke follow-up.  Residual right spastic hemiparesis, R adhesive capsulitis and dysarthria stable.  Continue Botox by Dr. PPosey Prontowith plans on repeating next month as well as ongoing use of baclofen. Scheduled shoulder injection with Dr. PPosey Prontofor adhesive capsulitis 12/9. Denies new stroke/TIA symptoms. Questionable compliance with prescribed medications as pt and wife both look confused when asked about taking specific medications such as baclofen, atorvastatin and Zetia but then will eventually say he is taking. Reports use of aspirin 81 mg daily and atorvastatin 80 mg daily for secondary stroke prevention.  Prior lipid panel 01/2020 showed LDL 149 therefore Zetia added in addition to atorvastatin.  Prior A1c 01/2020 12.2 (up from 8.4 6 mo prior) despite reported compliance of Jardiance. Patient reports monitoring BG twice daily typically 125 (unsure accuracy). He has not had recent follow-up with PCP.  Blood pressure today 126/74. Monitors at home and typically stable. No concerns at this time.    History provided for reference purposes only Update 01/23/2020 Casey Casey Petty: Mr. Casey Casey Petty being seen for stroke follow-up accompanied by his fiance.  Residual deficits of right spastic hemiparesis and dysarthria.  Continues to follow with PMR Dr. PPosey Prontofor Botox injections and ongoing use of baclofen as well as management of right shoulder adhesive capsulitis. He does report benefit with use of botox. Completed approved Medicaid visits with PT and OT with great benefit and is  ambulating with a cane and w/c for long distance. Also continues to use AFO brace. Continues on aspirin and atorvastatin for secondary stroke prevention. He has not had recent lab work by PCP. Blood pressure today 140/79.  No concerns at this time.  Update 07/25/2019: Mr. Casey Casey Petty who is being seen today for stroke follow-up.  Residual deficits of right spastic hemiparesis, mild right facial droop and dysarthria.  He does endorse ongoing improvement.  Continues to follow with physical medicine rehab Dr. PPosey Prontowith plans on receiving Botox for ongoing spasticity.  He also had baclofen dosage increased to 20 mg 3 times daily.  He has not received any therapies as previously Medicaid pending.  He primarily uses wheelchair for transfers but has been walking very short distances with assistance of his wife.  Previously using AFO brace but not currently in place with patient stating "I want to try something different".  Continues on aspirin and atorvastatin for secondary stroke prevention without side effects.  Prior lipid panel on 05/08/2019 showed LDL 57.  Blood pressure today 140/90.  Blood work obtained yesterday by PCP Dr. FChapman Fitchwith A1c 8.4.  Continues to follow with PCP actively for ongoing management.  Denies new or worsening stroke/TIA symptoms.  Initial visit 05/01/2019: Mr. Casey Casey Petty being seen today for hospital follow-up accompanied by his fiance.  Residual deficits of right hemiparesis and mild dysarthria with mild right facial droop.  He is not currently receiving any therapies as he is Medicaid pending.  He does continue to do exercises at home with ongoing improvement.  He does ambulate without assistive  device but does use wheelchair for long distance.  Currently has AFO brace in place for right ankle weakness.  Completed 3 weeks DAPT and continues on aspirin alone without bleeding or bruising.  Continues on atorvastatin 80 mg daily without myalgias.  Blood pressure today 125/71.   Glucose levels have been stable with ongoing use of metformin.  Continues to follow with PCP for HTN, HLD and DM management.  Denies new or worsening stroke/TIA symptoms.  Stroke admission 02/28/2019: Mr. Casey Casey Petty is a 59 y.o. Casey Petty with history of hypertension  who presented to [facility] ED on 02/28/2019 with right sided weakness and gait difficulties. He did not receive IV t-PA due to late presentation (>4.5 hours from time of onset).  Stroke work-up revealed left BG small infarct as evidenced on MRI likely secondary to small vessel disease source with resultant dysarthria and right hemiparesis.  TCD and carotid Doppler unremarkable.  2D echo normal EF.  Recommended DAPT for 3 weeks and aspirin alone. Hx of HTN with long-term BP goal normotensive range and discharged on home dose amlodipine and initiated lisinopril.  Initiated atorvastatin 80 mg daily for HLD management.  Current tobacco use with smoking cessation counseling provided.  Uncontrolled DM with A1c 7.9.  Other stroke risk factors include EtOH use but no prior history of stroke.  He was discharged to [facility] for ongoing therapy and discharged home on 03/20/2019.        ROS:   14 system review of systems performed and negative with exception of those listed in HPI  PMH:  Past Medical History:  Diagnosis Date  . Hypertension     PSH:  Past Surgical History:  Procedure Laterality Date  . NO PAST SURGERIES      Social History:  Social History   Socioeconomic History  . Marital status: Single    Spouse name: Not on file  . Number of children: Not on file  . Years of education: Not on file  . Highest education level: Not on file  Occupational History  . Not on file  Tobacco Use  . Smoking status: Current Every Day Smoker  . Smokeless tobacco: Never Used  . Tobacco comment: Smoking 4 cigs/day  Vaping Use  . Vaping Use: Never used  Substance and Sexual Activity  . Alcohol use: Yes    Alcohol/week: 2.0 standard drinks     Types: 2 Standard drinks or equivalent per week  . Drug use: No  . Sexual activity: Never  Other Topics Concern  . Not on file  Social History Narrative  . Not on file   Social Determinants of Health   Financial Resource Strain:   . Difficulty of Paying Living Expenses: Not on file  Food Insecurity:   . Worried About Charity fundraiser in the Last Year: Not on file  . Ran Out of Food in the Last Year: Not on file  Transportation Needs:   . Lack of Transportation (Medical): Not on file  . Lack of Transportation (Non-Medical): Not on file  Physical Activity:   . Days of Exercise per Week: Not on file  . Minutes of Exercise per Session: Not on file  Stress:   . Feeling of Stress : Not on file  Social Connections:   . Frequency of Communication with Friends and Family: Not on file  . Frequency of Social Gatherings with Friends and Family: Not on file  . Attends Religious Services: Not on file  . Active Member of Clubs or  Organizations: Not on file  . Attends Archivist Meetings: Not on file  . Marital Status: Not on file  Intimate Partner Violence:   . Fear of Current or Ex-Partner: Not on file  . Emotionally Abused: Not on file  . Physically Abused: Not on file  . Sexually Abused: Not on file    Family History:  Family History  Problem Relation Age of Onset  . Heart attack Father   . Cancer Neg Hx     Medications:   Current Outpatient Medications on File Prior to Visit  Medication Sig Dispense Refill  . acetaminophen (TYLENOL) 325 MG tablet Take 1-2 tablets (325-650 mg total) by mouth every 4 (four) hours as needed for mild pain.    Marland Kitchen amLODipine (NORVASC) 10 MG tablet Take 1 tablet (10 mg total) by mouth daily. To lower blood pressure 90 tablet 3  . aspirin 81 MG EC tablet Take 1 tablet (81 mg total) by mouth daily. 100 tablet 0  . atorvastatin (LIPITOR) 80 MG tablet One pill in the evenings to lower cholesterol 90 tablet 3  . baclofen (LIORESAL) 20 MG  tablet Take 1.5 tablets (30 mg total) by mouth 3 (three) times daily. 90 each 1  . Blood Glucose Monitoring Suppl (TRUE METRIX METER) w/Device KIT Check blood sugar fasting and before meals and again if pt feels bad (symptoms of hypo). 1 kit 0  . carvedilol (COREG) 12.5 MG tablet Take 1 tablet (12.5 mg total) by mouth 2 (two) times daily with a meal. For blood pressure 180 tablet 3  . diclofenac Sodium (VOLTAREN) 1 % GEL Apply 2 g topically 4 (four) times daily. 350 g 1  . empagliflozin (JARDIANCE) 10 MG TABS tablet Take 10 mg by mouth daily before breakfast. 90 tablet 1  . ezetimibe (ZETIA) 10 MG tablet Take 1 tablet (10 mg total) by mouth daily. 90 tablet 3  . famotidine (PEPCID) 20 MG tablet Take 1 tablet (20 mg total) by mouth daily. 30 tablet 0  . glucose blood (TRUE METRIX BLOOD GLUCOSE TEST) test strip Use as instructed 100 each 12  . hydrALAZINE (APRESOLINE) 100 MG tablet Take 1 tablet (100 mg total) by mouth every 8 (eight) hours. To lower blood pressure 270 tablet 3  . TRUEplus Lancets 28G MISC Check blood sugar fasting and before meals and again if pt feels bad (symptoms of hypo). 100 each 12   No current facility-administered medications on file prior to visit.    Allergies:   Allergies  Allergen Reactions  . Lisinopril Swelling    Angioedema- lips/face     Physical Exam  Vitals:   07/13/20 0729  BP: 126/74  Pulse: 76  Weight: 165 lb (74.8 kg)  Height: _0  (1.702 m)   Body mass index is 25.84 kg/m. No exam data present  General: well developed, well nourished, pleasant middle-age African-American Casey Petty, seated, in no evident distress Head: head normocephalic and atraumatic.   Neck: supple with no carotid or supraclavicular bruits Cardiovascular: regular rate and rhythm, no murmurs Musculoskeletal: no deformity Skin:  no rash/petichiae Vascular:  Normal pulses all extremities   Neurologic Exam Mental Status: Awake and fully alert.  Mild dysarthria. Oriented to  place and time. Recent and remote memory intact. Attention span, concentration and fund of knowledge mostly appropriate. Mood and affect appropriate.  Cranial Nerves: Pupils equal, briskly reactive to light. Extraocular movements full without nystagmus. Visual fields full to confrontation. Hearing intact. Facial sensation intact.  Right nasolabial fold  flattening Motor: RUE: 4/5 with decreased grip strength and spasticity RLE: 3+/5 hip flexor and 4/5 knee flexion and extension and 3/5 ankle dorsiflexion with AFO in place Full strength left upper and lower extremity Sensory.: intact to touch , pinprick , position and vibratory sensation.  Coordination: Rapid alternating movements normal on left side. Finger-to-nose and heel-to-shin performed accurately on left side. Gait and Station: Deferred as cane not present Reflexes: Brisk right upper and lower extremity ; 1+ left upper and lower extremity. Toes downgoing.      ASSESSMENT: Casey Casey Petty is a 59 y.o. year old Casey Petty presented with right-sided weakness and gait difficulties on 02/28/2019 with stroke work-up revealing left BG small infarct secondary to small vessel disease. Vascular risk factors include HTN, HLD and tobacco use.      PLAN:  1. Left BG infarct:  a. Residual stroke deficits: Right spastic hemiparesis, right facial droop and dysarthria. Encouraged use of baclofen as prescribed by Dr. Posey Pronto and ongoing Botox injections. Scheduled injection for adhesive capsulitis 12/9 with Dr. Posey Pronto. Order PT/OT at the beginning of next month as he has completed all of our therapy sessions for the year of 2021 b. Continue aspirin 81 mg daily  and atorvastatin for secondary stroke prevention.  c. Discussed secondary stroke prevention measures and importance of close PCP follow-up for aggressive stroke risk factor management 2. HTN: BP goal<130/90.  Stable on hydralazine, carvedilol and amlodipine per PCP 3. HLD: LDL goal<70.  LDL 149 (6/20201) on  atorvastatin 80 mg daily therefore Zetia 10 mg daily added. He reports compliance although this is questioned. Repeat lipid panel today. Also discussed dietary changes 4. DM: A1c goal<7.0.  A1c 12.2 (01/2020) on Jardiance per PCP.  Repeat A1c today. Discussed dietary changes and need for PCP follow-up    Follow up in 6 months or call earlier if needed   CC: Charlott Rakes, MD Antony Contras, MD    I spent 30 minutes of face-to-face and non-face-to-face time with patient and fianc.  This included previsit chart review, lab review, study review, order entry, electronic health record documentation, patient and fianc education and discussion regarding history of stroke, residual deficits, importance of managing stroke risk factors and ensuring compliance and answered all other questions to patient and fianc satisfaction   Casey Casey Petty, AGNP-BC  [facility] [address] [place], [place] [address]  Phone [phone] Fax [phone] Note: This document was prepared with digital dictation and possible smart phrase technology. Any transcriptional errors that result from this process are unintentional.

## 2020-07-14 ENCOUNTER — Other Ambulatory Visit: Payer: Self-pay | Admitting: Family Medicine

## 2020-07-14 DIAGNOSIS — I10 Essential (primary) hypertension: Secondary | ICD-10-CM

## 2020-07-14 DIAGNOSIS — R69 Illness, unspecified: Secondary | ICD-10-CM | POA: Diagnosis not present

## 2020-07-14 DIAGNOSIS — I6930 Unspecified sequelae of cerebral infarction: Secondary | ICD-10-CM

## 2020-07-14 LAB — LIPID PANEL
Chol/HDL Ratio: 3.4 ratio (ref 0.0–5.0)
Cholesterol, Total: 94 mg/dL — ABNORMAL LOW (ref 100–199)
HDL: 28 mg/dL — ABNORMAL LOW (ref >39)
LDL Chol Calc (NIH): 46 mg/dL (ref 0–99)
Triglycerides: 108 mg/dL (ref 0–149)
VLDL Cholesterol Cal: 20 mg/dL (ref 5–40)

## 2020-07-14 LAB — HEMOGLOBIN A1C
Est. average glucose Bld gHb Est-mCnc: 372 mg/dL
Hgb A1c MFr Bld: 14.6 % — ABNORMAL HIGH (ref 4.8–5.6)

## 2020-07-14 MED FILL — hydrALAZINE HCL 100 MG TABS: 100 | 30 days supply | Qty: 90 | Fill #0

## 2020-07-14 MED FILL — EZETIMIBE 10 MG TABS: 10 | 90 days supply | Qty: 90 | Fill #2

## 2020-07-15 DIAGNOSIS — R69 Illness, unspecified: Secondary | ICD-10-CM | POA: Diagnosis not present

## 2020-07-16 ENCOUNTER — Encounter: Payer: Medicaid Other | Attending: Registered Nurse | Admitting: Physical Medicine & Rehabilitation

## 2020-07-16 ENCOUNTER — Other Ambulatory Visit: Payer: Self-pay

## 2020-07-16 ENCOUNTER — Telehealth: Payer: Self-pay

## 2020-07-16 ENCOUNTER — Encounter: Payer: Self-pay | Admitting: Physical Medicine & Rehabilitation

## 2020-07-16 VITALS — BP 123/76 | HR 71 | Temp 98.0°F | Ht 67.0 in | Wt 165.0 lb

## 2020-07-16 DIAGNOSIS — M7501 Adhesive capsulitis of right shoulder: Secondary | ICD-10-CM | POA: Diagnosis not present

## 2020-07-16 DIAGNOSIS — R69 Illness, unspecified: Secondary | ICD-10-CM | POA: Diagnosis not present

## 2020-07-16 NOTE — Telephone Encounter (Signed)
-----   Message from [name], NP sent at 07/14/2020  5:40 PM EST ----- Please advise patient that recent lipid panel showed improvement of cholesterol and recommend continuation of atorvastatin and Zetia.  A1c greatly elevated up to 14.6 with goal of less than 7.  Questionable medication and dietary compliance at home.  Please advise importance of PCP follow-up for further evaluation or possible need of endocrinology referral

## 2020-07-16 NOTE — Telephone Encounter (Signed)
Attempted to call the patient without success.  LM on the VM for the patient to call back re: recent results.  **If the patient calls back please connect the call to myself or Acupuncturist**

## 2020-07-16 NOTE — Progress Notes (Signed)
Ultrasound guided intraarticular shoulder injection: Right intra-articular glenohumeral joint  Indication:Shoulder pain not relieved by medication management and other conservative care.  Informed consent was obtained after describing risks and benefits of the procedure with the patient, this includes bleeding, bruising, infection and medication side effects. The patient wishes to proceed and has given written consent. Patient was placed in a seated position with the ipsilateral hand placed on the contralateral shoulder. The right shoulder was marked and prepped with betadine posterior and inferior to the acromium. An ultrasound tranducer was placed inferior and parallel to the scapular spine.  The glenohumeral joint was visualized.  Vapocoolant spray was applied.  A 22 -gauge 2 inch needle was inserted into the subacromial area under with visualization until the needle was in the joint space. After negative draw back for blood, a solution containing 1 mL of 6 mg per ML betamethasone and 4 mL of 1% lidocaine was injected. Some difficulty with injection due to patient movement due to laughter. A band aid was applied. The patient with repeated giggling and movement throughout the procedure. Tolerated the procedure well. Post procedure instructions were given.

## 2020-07-17 DIAGNOSIS — R69 Illness, unspecified: Secondary | ICD-10-CM | POA: Diagnosis not present

## 2020-07-18 DIAGNOSIS — R69 Illness, unspecified: Secondary | ICD-10-CM | POA: Diagnosis not present

## 2020-07-20 DIAGNOSIS — R69 Illness, unspecified: Secondary | ICD-10-CM | POA: Diagnosis not present

## 2020-07-20 MED FILL — ATORVASTATIN CALCIUM 80 MG: 80 | 30 days supply | Qty: 30 | Fill #0

## 2020-07-20 MED FILL — AMLODIPINE BESYLATE 10 MG T: 10 | 30 days supply | Qty: 30 | Fill #0

## 2020-07-21 DIAGNOSIS — R69 Illness, unspecified: Secondary | ICD-10-CM | POA: Diagnosis not present

## 2020-07-22 DIAGNOSIS — R69 Illness, unspecified: Secondary | ICD-10-CM | POA: Diagnosis not present

## 2020-07-23 DIAGNOSIS — R69 Illness, unspecified: Secondary | ICD-10-CM | POA: Diagnosis not present

## 2020-07-24 DIAGNOSIS — R69 Illness, unspecified: Secondary | ICD-10-CM | POA: Diagnosis not present

## 2020-07-25 DIAGNOSIS — R69 Illness, unspecified: Secondary | ICD-10-CM | POA: Diagnosis not present

## 2020-07-27 DIAGNOSIS — R69 Illness, unspecified: Secondary | ICD-10-CM | POA: Diagnosis not present

## 2020-07-28 DIAGNOSIS — R69 Illness, unspecified: Secondary | ICD-10-CM | POA: Diagnosis not present

## 2020-07-29 DIAGNOSIS — R69 Illness, unspecified: Secondary | ICD-10-CM | POA: Diagnosis not present

## 2020-07-30 DIAGNOSIS — R69 Illness, unspecified: Secondary | ICD-10-CM | POA: Diagnosis not present

## 2020-07-31 DIAGNOSIS — R69 Illness, unspecified: Secondary | ICD-10-CM | POA: Diagnosis not present

## 2020-08-01 DIAGNOSIS — R69 Illness, unspecified: Secondary | ICD-10-CM | POA: Diagnosis not present

## 2020-08-03 DIAGNOSIS — R69 Illness, unspecified: Secondary | ICD-10-CM | POA: Diagnosis not present

## 2020-08-04 DIAGNOSIS — R69 Illness, unspecified: Secondary | ICD-10-CM | POA: Diagnosis not present

## 2020-08-04 MED FILL — BACLOFEN 20 MG TABLET: 20 | 20 days supply | Qty: 90 | Fill #1

## 2020-08-05 DIAGNOSIS — R69 Illness, unspecified: Secondary | ICD-10-CM | POA: Diagnosis not present

## 2020-08-06 DIAGNOSIS — R69 Illness, unspecified: Secondary | ICD-10-CM | POA: Diagnosis not present

## 2020-08-10 ENCOUNTER — Other Ambulatory Visit: Payer: Self-pay | Admitting: Adult Health

## 2020-08-10 DIAGNOSIS — I639 Cerebral infarction, unspecified: Secondary | ICD-10-CM | POA: Diagnosis not present

## 2020-08-10 DIAGNOSIS — G8111 Spastic hemiplegia affecting right dominant side: Secondary | ICD-10-CM

## 2020-08-11 DIAGNOSIS — I639 Cerebral infarction, unspecified: Secondary | ICD-10-CM | POA: Diagnosis not present

## 2020-08-12 DIAGNOSIS — I639 Cerebral infarction, unspecified: Secondary | ICD-10-CM | POA: Diagnosis not present

## 2020-08-13 ENCOUNTER — Encounter: Payer: Medicaid Other | Attending: Registered Nurse | Admitting: Physical Medicine & Rehabilitation

## 2020-08-13 ENCOUNTER — Encounter: Payer: Self-pay | Admitting: Physical Medicine & Rehabilitation

## 2020-08-13 ENCOUNTER — Emergency Department (HOSPITAL_COMMUNITY)
Admission: EM | Admit: 2020-08-13 | Discharge: 2020-08-13 | Disposition: A | Discharge status: Home / Self Care | Payer: Medicaid Other | Attending: Emergency Medicine | Admitting: Emergency Medicine

## 2020-08-13 ENCOUNTER — Encounter (HOSPITAL_COMMUNITY): Payer: Self-pay | Admitting: Emergency Medicine

## 2020-08-13 ENCOUNTER — Other Ambulatory Visit: Payer: Self-pay

## 2020-08-13 VITALS — BP 160/77 | HR 88 | Temp 98.0°F

## 2020-08-13 DIAGNOSIS — N481 Balanitis: Secondary | ICD-10-CM | POA: Insufficient documentation

## 2020-08-13 DIAGNOSIS — E1165 Type 2 diabetes mellitus with hyperglycemia: Secondary | ICD-10-CM | POA: Insufficient documentation

## 2020-08-13 DIAGNOSIS — N471 Phimosis: Secondary | ICD-10-CM | POA: Diagnosis not present

## 2020-08-13 DIAGNOSIS — G8111 Spastic hemiplegia affecting right dominant side: Secondary | ICD-10-CM | POA: Diagnosis present

## 2020-08-13 DIAGNOSIS — Z7984 Long term (current) use of oral hypoglycemic drugs: Secondary | ICD-10-CM | POA: Insufficient documentation

## 2020-08-13 DIAGNOSIS — Z79899 Other long term (current) drug therapy: Secondary | ICD-10-CM | POA: Insufficient documentation

## 2020-08-13 DIAGNOSIS — I639 Cerebral infarction, unspecified: Secondary | ICD-10-CM | POA: Diagnosis not present

## 2020-08-13 DIAGNOSIS — N4889 Other specified disorders of penis: Secondary | ICD-10-CM | POA: Diagnosis present

## 2020-08-13 DIAGNOSIS — Z7982 Long term (current) use of aspirin: Secondary | ICD-10-CM | POA: Diagnosis not present

## 2020-08-13 DIAGNOSIS — I10 Essential (primary) hypertension: Secondary | ICD-10-CM | POA: Insufficient documentation

## 2020-08-13 DIAGNOSIS — F17200 Nicotine dependence, unspecified, uncomplicated: Secondary | ICD-10-CM | POA: Diagnosis not present

## 2020-08-13 DIAGNOSIS — N472 Paraphimosis: Secondary | ICD-10-CM | POA: Diagnosis not present

## 2020-08-13 DIAGNOSIS — N4829 Other inflammatory disorders of penis: Secondary | ICD-10-CM | POA: Diagnosis not present

## 2020-08-13 MED ORDER — LIDOCAINE-PRILOCAINE 2.5-2.5 % EX CREA
TOPICAL_CREAM | Freq: Once | CUTANEOUS | Status: AC
  Filled 2020-08-13: qty 5

## 2020-08-13 MED ORDER — CLOTRIMAZOLE 1 % EX CREA: TOPICAL_CREAM | CUTANEOUS | 0 refills | Status: DC

## 2020-08-13 NOTE — Consult Note (Signed)
Urology Consult   Physician requesting consult: Dr. Shanon Rosser, MD  Reason for consult: Paraphimosis  History of Present Illness: Casey Petty is a 60 y.o. presents to the ED today with complaints of penile pain for 3 days.  He notes pain and penile swelling for the past 3 days.  He notes that his glans is exposed and his foreskin has been retracted.  He is unsure how this was initiated.  He states he has never had this problem before.  He denies any exacerbating or alleviating factors.  He is unable to reduce his foreskin by himself.  He denies any testicular, abdominal or flank pain.  He denies any voiding complaints.  He denies a history of voiding or storage urinary symptoms, hematuria, UTIs, STDs, urolithiasis, GU malignancy/trauma/surgery.  Past Medical History:  Diagnosis Date  . Hypertension     Past Surgical History:  Procedure Laterality Date  . NO PAST SURGERIES      Medications:  Home meds:  No current facility-administered medications on file prior to encounter.   Current Outpatient Medications on File Prior to Encounter  Medication Sig Dispense Refill  . acetaminophen (TYLENOL) 325 MG tablet Take 1-2 tablets (325-650 mg total) by mouth every 4 (four) hours as needed for mild pain.    Marland Kitchen amLODipine (NORVASC) 10 MG tablet TAKE 1 TABLET (10 MG TOTAL) BY MOUTH DAILY. TO LOWER BLOOD PRESSURE 45 tablet 0  . aspirin 81 MG EC tablet Take 1 tablet (81 mg total) by mouth daily. 100 tablet 0  . atorvastatin (LIPITOR) 80 MG tablet TAKE 1 TABLET BY MOUTH IN THE EVENINGS TO LOWER CHOLESTEROL 45 tablet 0  . baclofen (LIORESAL) 20 MG tablet Take 1.5 tablets (30 mg total) by mouth 3 (three) times daily. 90 each 1  . Blood Glucose Monitoring Suppl (TRUE METRIX METER) w/Device KIT Check blood sugar fasting and before meals and again if pt feels bad (symptoms of hypo). 1 kit 0  . carvedilol (COREG) 12.5 MG tablet Take 1 tablet (12.5 mg total) by mouth 2 (two) times daily with a meal. For  blood pressure 180 tablet 3  . diclofenac Sodium (VOLTAREN) 1 % GEL Apply 2 g topically 4 (four) times daily. 350 g 1  . empagliflozin (JARDIANCE) 10 MG TABS tablet Take 10 mg by mouth daily before breakfast. 90 tablet 1  . ezetimibe (ZETIA) 10 MG tablet Take 1 tablet (10 mg total) by mouth daily. 90 tablet 3  . famotidine (PEPCID) 20 MG tablet Take 1 tablet (20 mg total) by mouth daily. 30 tablet 0  . glucose blood (TRUE METRIX BLOOD GLUCOSE TEST) test strip Use as instructed 100 each 12  . hydrALAZINE (APRESOLINE) 100 MG tablet TAKE 1 TABLET (100 MG TOTAL) BY MOUTH EVERY 8 (EIGHT) HOURS. TO LOWER BLOOD PRESSURE 120 tablet 0  . TRUEplus Lancets 28G MISC Check blood sugar fasting and before meals and again if pt feels bad (symptoms of hypo). 100 each 12     Scheduled Meds: Continuous Infusions: PRN Meds:.  Allergies:  Allergies  Allergen Reactions  . Lisinopril Swelling    Angioedema- lips/face    Family History  Problem Relation Age of Onset  . Heart attack Father   . Cancer Neg Hx     Social History:  reports that he has been smoking. He has never used smokeless tobacco. He reports current alcohol use of about 2.0 standard drinks of alcohol per week. He reports that he does not use drugs.  ROS: A  complete review of systems was performed.  All systems are negative except for pertinent findings as noted.  Physical Exam:  Vital signs in last 24 hours: Temp:  [98 F (36.7 C)-98.2 F (36.8 C)] 98 F (36.7 C) (01/06 1541) Pulse Rate:  [83-90] 83 (01/06 1541) Resp:  [16] 16 (01/06 1541) BP: (160-165)/(75-92) 165/92 (01/06 1541) SpO2:  [97 %-100 %] 97 % (01/06 1541) Weight:  [74.8 kg] 74.8 kg (01/06 1002) Constitutional:  Alert and oriented, No acute distress Cardiovascular: Regular rate and rhythm Respiratory: Normal respiratory effort, Lungs clear bilaterally GI: Abdomen is soft, nontender, nondistended, no abdominal masses Genitourinary: Initially he has paraphimosis with  his glans which is quite edematous however pink and patent.  There is a small amount of dried purulence around his foreskin.  I was able to reduce his foreskin at bedside without difficulty. Neurologic: Grossly intact, no focal deficits Psychiatric: Normal mood and affect  Laboratory Data:  No results for input(s): WBC, HGB, HCT, PLT in the last 72 hours.  No results for input(s): NA, K, CL, GLUCOSE, BUN, CALCIUM, CREATININE in the last 72 hours.  Invalid input(s): CO3   No results found for this or any previous visit (from the past 24 hour(s)). No results found for this or any previous visit (from the past 240 hour(s)).  Renal Function: No results for input(s): CREATININE in the last 168 hours. CrCl cannot be calculated (Patient's most recent lab result is older than the maximum 21 days allowed.).  Radiologic Imaging: No results found.  I independently reviewed the above imaging studies.  Impression/Recommendation 1. Paraphimosis  -After squeezing the glans for approximately 2 minutes, his foreskin was reduced without difficulty.  I advised the patient to leave the foreskin over the glans of the penis for the next 48 hours.  Advised that he reach present if he ever has difficulty with reducing his foreskin. -Okay for discharge from GU standpoint.  Matt R. Jehiel Koepp MD 08/13/2020, 4:59 PM  [facility]  Pager: [phone]   CC: Dr. Shanon Rosser, MD

## 2020-08-13 NOTE — Discharge Instructions (Addendum)
I have provided a referral to Urology for you to follow up with them.  Prescription for clotrimazole was given, please apply to the area twice a day for the next 7 days.

## 2020-08-13 NOTE — ED Notes (Signed)
Will update patients vitals when pt returns to hallway.

## 2020-08-13 NOTE — ED Provider Notes (Signed)
[facility] EMERGENCY DEPARTMENT Provider Note   CSN: [id number] Arrival date & time: 08/13/20  3419     History Chief Complaint  Patient presents with  . Groin Swelling    Casey Petty is a 60 y.o. male.  60 y.o male with a  PMH of HTN, CVA, DM presents to the ED with a chief complaint of penile swelling x 3 days. Fiance at the bedside providing history and states swelling has been ongoing, fiance at the bedside reports she has been cleaning it with peroxide and remove some skin from the side along with hair that was around it. No exacerbating factors, reports no pain with movement of the foreskin. However, has not been able to pull skin forward. No testicular swelling, no testicular pain or urinary symptoms, or fevers.    The history is provided by the patient.       Past Medical History:  Diagnosis Date  . Hypertension     Patient Active Problem List   Diagnosis Date Noted  . Chronic right shoulder pain 06/22/2020  . Adhesive capsulitis of right shoulder 10/07/2019  . Abnormality of gait 09/17/2019  . Spastic hemiparesis of right dominant side ([place]) 07/02/2019  . History of CVA with residual deficit 06/09/2019  . Spastic hemiparesis ([place])   . Uncontrolled type 2 diabetes mellitus with hyperglycemia ([place])   . Noncompliance   . Benign essential HTN   . Newly diagnosed diabetes ([place])   . Leucocytosis   . Right hemiparesis ([place])   . Basal ganglia stroke ([place]) 03/04/2019  . Stroke ([place]) 02/28/2019  . Essential hypertension 02/28/2019  . Tobacco abuse 02/28/2019  . Hyperglycemia 02/28/2019    Past Surgical History:  Procedure Laterality Date  . NO PAST SURGERIES         Family History  Problem Relation Age of Onset  . Heart attack Father   . Cancer Neg Hx     Social History   Tobacco Use  . Smoking status: Current Every Day Smoker  . Smokeless tobacco: Never Used  . Tobacco comment: Smoking 4 cigs/day  Vaping Use  . Vaping Use:  Never used  Substance Use Topics  . Alcohol use: Yes    Alcohol/week: 2.0 standard drinks    Types: 2 Standard drinks or equivalent per week  . Drug use: No    Home Medications Prior to Admission medications   Medication Sig Start Date End Date Taking? Authorizing Provider  clotrimazole (LOTRIMIN) 1 % cream Apply to affected area 2 times daily for the next 7 days. 08/13/20  Yes Fabiano Ginley, Beverley Fiedler, PA-C  acetaminophen (TYLENOL) 325 MG tablet Take 1-2 tablets (325-650 mg total) by mouth every 4 (four) hours as needed for mild pain. 03/20/19   Love, Ivan Anchors, PA-C  amLODipine (NORVASC) 10 MG tablet TAKE 1 TABLET (10 MG TOTAL) BY MOUTH DAILY. TO LOWER BLOOD PRESSURE 07/14/20   Charlott Rakes, MD  aspirin 81 MG EC tablet Take 1 tablet (81 mg total) by mouth daily. 03/20/19   Love, Ivan Anchors, PA-C  atorvastatin (LIPITOR) 80 MG tablet TAKE 1 TABLET BY MOUTH IN THE EVENINGS TO LOWER CHOLESTEROL 07/14/20   Charlott Rakes, MD  baclofen (LIORESAL) 20 MG tablet Take 1.5 tablets (30 mg total) by mouth 3 (three) times daily. 06/22/20   [name], MD  Blood Glucose Monitoring Suppl (TRUE METRIX METER) w/Device KIT Check blood sugar fasting and before meals and again if pt feels bad (symptoms of hypo). 04/24/19  Fulp, Cammie, MD  carvedilol (COREG) 12.5 MG tablet Take 1 tablet (12.5 mg total) by mouth 2 (two) times daily with a meal. For blood pressure 07/24/19   Fulp, Cammie, MD  diclofenac Sodium (VOLTAREN) 1 % GEL Apply 2 g topically 4 (four) times daily. 12/19/19   [name], MD  empagliflozin (JARDIANCE) 10 MG TABS tablet Take 10 mg by mouth daily before breakfast. 07/24/19   Fulp, Cammie, MD  ezetimibe (ZETIA) 10 MG tablet Take 1 tablet (10 mg total) by mouth daily. 01/27/20   [name], NP  famotidine (PEPCID) 20 MG tablet Take 1 tablet (20 mg total) by mouth daily. 03/20/19   Love, Ivan Anchors, PA-C  glucose blood (TRUE METRIX BLOOD GLUCOSE TEST) test strip Use as instructed 04/24/19   Fulp,  Cammie, MD  hydrALAZINE (APRESOLINE) 100 MG tablet TAKE 1 TABLET (100 MG TOTAL) BY MOUTH EVERY 8 (EIGHT) HOURS. TO LOWER BLOOD PRESSURE 07/14/20   Charlott Rakes, MD  TRUEplus Lancets 28G MISC Check blood sugar fasting and before meals and again if pt feels bad (symptoms of hypo). 04/24/19   Fulp, Ander Gaster, MD    Allergies    Lisinopril  Review of Systems   Review of Systems  Constitutional: Negative for fever.  Genitourinary: Positive for penile swelling. Negative for frequency, genital sores, penile pain, scrotal swelling, testicular pain and urgency.    Physical Exam Updated Vital Signs BP (!) 165/92 (BP Location: Left Arm)   Pulse 83   Temp 98 F (36.7 C)   Resp 16   Ht $R'5\' 7"'DL$  (1.702 m)   Wt 74.8 kg   SpO2 97%   BMI 25.84 kg/m   Physical Exam Vitals and nursing note reviewed. Exam conducted with a chaperone present.  Constitutional:      Appearance: Normal appearance.  HENT:     Head: Normocephalic and atraumatic.     Mouth/Throat:     Mouth: Mucous membranes are moist.  Cardiovascular:     Rate and Rhythm: Normal rate.  Pulmonary:     Effort: Pulmonary effort is normal.  Abdominal:     General: Abdomen is flat.  Genitourinary:    Penis: Uncircumcised. Discharge present.      Testes:        Right: Tenderness not present.        Left: Tenderness not present.     Comments: Chaperone by RN Keane Scrape, white discharge surrounding the glans.  Musculoskeletal:     Cervical back: Normal range of motion and neck supple.  Skin:    General: Skin is warm and dry.  Neurological:     Mental Status: He is alert and oriented to person, place, and time.     ED Results / Procedures / Treatments   Labs (all labs ordered are listed, but only abnormal results are displayed) Labs Reviewed - No data to display  EKG None  Radiology No results found.  Procedures Procedures (including critical care time)  Medications Ordered in ED Medications  lidocaine-prilocaine (EMLA)  cream ( Topical Given 08/13/20 1415)    ED Course  I have reviewed the triage vital signs and the nursing notes.  Pertinent labs & imaging results that were available during my care of the patient were reviewed by me and considered in my medical decision making (see chart for details).    MDM Rules/Calculators/A&P     Patient with a significant medical history including diabetes,stroke presents to the ED with a chief complaint of penile swelling.  Patient reports symptoms began 3 days ago, they are consistent with swelling of the foreskin, he is unable to pull it forward.  He reports no pain with this, there is also discharge noted to the surrounding glans area.   Chaperoned by Reynolds Bowl.  After extensive chart review, patient is a diabetic however denies this. Suspicion for Balanatis along with phimosis as he cannot pull the skin forward. First evaluation was done in the hallway along with ED bathroom. Will place EMLA on patient along with attempt manual reduction.   Attempt for reduction of foreskin performed by my attending Dr. Laverta Baltimore, unsuccessful at the time.  Urology was consulted in order to obtain further recommendations.  Urologist Dr.Gay will evaluate patient while in the ED.  Patient has been successfully reduced after paraphimosis, we will also treat his balanitis on today's visit with a prescription for clotrimazole.  Vitals within stable limits, patient is stable for discharge at this time.  Portions of this note were generated with [employer]. Dictation errors may occur despite best attempts at proofreading.  Final Clinical Impression(s) / ED Diagnoses Final diagnoses:  Balanitis  Phimosis of penis    Rx / DC Orders ED Discharge Orders         Ordered    Ambulatory referral to Urology        08/13/20 1249    clotrimazole (LOTRIMIN) 1 % cream        08/13/20 1606           [name], PA-C 08/13/20 1609    Margette Fast, MD 08/14/20 [phone]

## 2020-08-13 NOTE — ED Triage Notes (Signed)
Pt reports penile swelling x3 days, states he is uncircumcized, denies any penile discharge or urinary symptoms.

## 2020-08-13 NOTE — ED Notes (Signed)
D/c instructions given to pt. Esignature pad not available

## 2020-08-13 NOTE — Progress Notes (Signed)
Dysport: Procedure Note Patient Name: Casey Petty DOB: 05/05/1961 MRN: [id number]  Date: 08/13/2020  Procedure: Botulinum toxin administration Guidance: EMG Diagnosis: Spastic Hemiparesis Attending: Maryla Morrow, MD    Informed consent: Risks, benefits & options of the procedure are explained to the patient (and/or family). The patient elects to proceed with procedure. Risks include but are not limited to weakness, respiratory distress, dry mouth, ptosis, antibody formation, worsening of some areas of function. Benefits include decreased abnormal muscle tone, improved hygiene and positioning, decreased skin breakdown and, in some cases, decreased pain. Options include conservative management with oral antispasticity agents, phenol chemodenervation of nerve or at motor nerve branches. More invasive options include intrathecal balcofen adminstration for appropriate candidates. Surgical options may include tendon lengthening or transposition or, rarely, dorsal rhizotomy.   History/Physical Examination: 60 y.o. male with history of HTN presents for follow up for left basal ganglia stroke.   mAS Right       Shoulder abductions 2/4                                     Elbow flexors 3/4                                     Wrist flextors 3/4                                     Finger flexors 1+/4                                      Ankle dorsiflexors 1+/4  Previous Treatments: Therapy/Range of motion Indication for guidance: Target active muscules  Procedure: Botulinum toxin was mixed with preservative free saline with a dilution of 0.5cc to 100 units. Targeted limb and muscles were identified. The skin was prepped with alcohol swabs and placement of needle tip in targeted muscle was confirmed using appropriate guidance. Prior to injection, positioning of needle tip outside of blood vessel was determined by pulling back on syringe plunger.  MUSCLE UNITS                          Right    Mec  Major:      50 units                                                                          Biceps:            300                                     FCR                 250  FCU                 250                                     FDS                 250                                     FDP                 100                          Right    Gastroc Med   50                                     Lastroc Lat      50   Total units used: 123456 Complications: None Plan: 6 weeks  Casey Petty Casey Petty 8:57 AM

## 2020-08-14 DIAGNOSIS — I639 Cerebral infarction, unspecified: Secondary | ICD-10-CM | POA: Diagnosis not present

## 2020-08-15 DIAGNOSIS — I639 Cerebral infarction, unspecified: Secondary | ICD-10-CM | POA: Diagnosis not present

## 2020-08-17 DIAGNOSIS — I639 Cerebral infarction, unspecified: Secondary | ICD-10-CM | POA: Diagnosis not present

## 2020-08-18 DIAGNOSIS — I639 Cerebral infarction, unspecified: Secondary | ICD-10-CM | POA: Diagnosis not present

## 2020-08-19 ENCOUNTER — Encounter: Payer: Self-pay | Admitting: Occupational Therapy

## 2020-08-19 ENCOUNTER — Ambulatory Visit: Payer: Medicaid Other | Attending: Adult Health

## 2020-08-19 ENCOUNTER — Ambulatory Visit: Payer: Medicaid Other | Admitting: Occupational Therapy

## 2020-08-19 ENCOUNTER — Other Ambulatory Visit: Payer: Self-pay

## 2020-08-19 DIAGNOSIS — R2681 Unsteadiness on feet: Secondary | ICD-10-CM | POA: Diagnosis present

## 2020-08-19 DIAGNOSIS — G8929 Other chronic pain: Secondary | ICD-10-CM | POA: Diagnosis present

## 2020-08-19 DIAGNOSIS — I69351 Hemiplegia and hemiparesis following cerebral infarction affecting right dominant side: Secondary | ICD-10-CM

## 2020-08-19 DIAGNOSIS — R2689 Other abnormalities of gait and mobility: Secondary | ICD-10-CM | POA: Diagnosis present

## 2020-08-19 DIAGNOSIS — M6281 Muscle weakness (generalized): Secondary | ICD-10-CM

## 2020-08-19 DIAGNOSIS — M25511 Pain in right shoulder: Secondary | ICD-10-CM | POA: Diagnosis present

## 2020-08-19 DIAGNOSIS — I639 Cerebral infarction, unspecified: Secondary | ICD-10-CM | POA: Diagnosis not present

## 2020-08-19 NOTE — Therapy (Signed)
[facility] [address] [place], [place], [address] Phone: [phone]   Fax:  [phone]  Occupational Therapy Evaluation  Patient Details  Name: Casey Petty MRN: [id number] Date of Birth: 1961/04/22 Referring Provider (OT): Dr Delice Lesch   Encounter Date: 08/19/2020   OT End of Session - 08/19/20 1803    Visit Number 1    Number of Visits 9    Authorization Type MCD- awaiting approval    Authorization Time Period awaiting approval    OT Start Time [id number]    OT Stop Time 1230    OT Time Calculation (min) 45 min           Past Medical History:  Diagnosis Date  . Hypertension     Past Surgical History:  Procedure Laterality Date  . NO PAST SURGERIES      There were no vitals filed for this visit.   Subjective Assessment - 08/19/20 1758    Patient is accompanied by: --   [place] OT Assessment - 08/19/20 1151      Assessment   Medical Diagnosis Spastic hemiparesis    Referring Provider (OT) Dr Delice Lesch    Onset Date/Surgical Date 08/11/19    Hand Dominance Right    Prior Therapy OP OT until ummer 2021      Precautions   Precautions Fall      Balance Screen   Has the patient fallen in the past 6 months No      Prior Function   Level of Independence Independent with basic ADLs    Vocation On disability    Vocation Requirements Traffic controller    Leisure exercising, rehab      ADL   Eating/Feeding Minimal assistance    Grooming Minimal assistance    Upper Body Bathing Minimal assistance    Lower Body Bathing Minimal assistance    Upper Body Dressing Minimal assistance    Lower Body Dressing Moderate assistance    Toilet Transfer Minimal assistance    Toileting - Clothing Manipulation Minimal assistance    Toileting -  Hygiene Minimal assistance    Tub/Shower Transfer Minimal assistance    ADL comments Valrie Hart helps with most ADL      Vision  Assessment   Eye Alignment Within Functional Limits    Ocular Range of Motion Within Functional Limits    Tracking/Visual Pursuits Able to track stimulus in all quads without difficulty      Cognition   Overall Cognitive Status Impaired/Different from baseline    Attention Selective    Executive Function Organizing;Decision Making;Initiating;Self Monitoring      Observation/Other Assessments   Focus on Therapeutic Outcomes (FOTO)  NA- chronic      Posture/Postural Control   Posture/Postural Control Postural limitations    Postural Limitations Increased thoracic kyphosis;Posterior pelvic tilt      Sensation   Light Touch Appears Intact    Stereognosis Not tested    Hot/Cold Not tested    Proprioception Not tested      Coordination   Gross Motor Movements are Fluid and Coordinated No    Fine Motor Movements are Fluid and Coordinated No      Perception   Perception Not tested      Praxis   Praxis Not tested      ROM / Strength   AROM / PROM / Strength AROM;PROM;Strength  AROM   Overall AROM  Deficits    AROM Assessment Site Shoulder;Elbow    Right/Left Shoulder Right    Right Shoulder Flexion 115 Degrees   with trunk compensatory movements   Right Shoulder ABduction 105 Degrees   excessive effort   Right/Left Elbow Right    Right Elbow Extension --   Pain in right shoulder with attempts at active elbow extension     RUE Tone   RUE Tone Moderate;Hypertonic      RUE Tone   Hypertonic Details Shoulder adduction, elbow flexion, forearm pronation, wrist flexion                           OT Education - 08/19/20 1803    Education Details potential ot goals based on clinical findings    Person(s) Educated Patient;Spouse    Methods Explanation    Comprehension Verbalized understanding;Need further instruction            OT Short Term Goals - 08/19/20 1808      OT SHORT TERM GOAL #1   Title Independent with HEP for RUE    Baseline Has prior  program, needs updated as patient moves quickly and is developing pain    Time 4    Period Weeks    Status New    Target Date 10/03/20      OT SHORT TERM GOAL #2   Title Independent with resting hand splint for pm wear    Baseline dependent, no longer wearing    Time 4    Period Weeks    Status New      OT SHORT TERM GOAL #3   Title Decrease pain with active elbow extension by 2 points (10 point scale)    Baseline 5/10    Time 4    Period Weeks    Status New             OT Long Term Goals - 08/19/20 1810      OT LONG TERM GOAL #1   Title Pt to verbalize understanding of AE needs to increase independence and safety with ADLS (including walker splint, shoe buttons, one handed cutting board)    Baseline mod assist    Time 8    Period Weeks    Status New    Target Date 11/02/20      OT LONG TERM GOAL #2   Title Pt to report shoulder pain less than or equal to 5/10 with P/ROM to 130*    Baseline 115 flex, and 105 abd    Time 8    Period Weeks    Status New      OT LONG TERM GOAL #3   Title Patient will tolerate splinting schedule for hand and or elbow orthosis    Baseline inconsistent    Time 8    Period Weeks    Status New      OT LONG TERM GOAL #4   Title Patient will don doff shirt with modified independence    Baseline min assist    Time 8    Period Weeks    Status New                 Plan - 08/19/20 1804    Clinical Impression Statement Patient is a 60 yr old male returning to this clinic in new benefit year.  Patient with spastic hemiplegia, adhesive capsulitis, and has been receiving Botox injections.  Patient with improvements in range of motion and functional mobility since his last episode of care.  Patient will benefit from skilled OT intervention to address his dependence with ADL/IADL, his range of motion passively and actively throughout RUE, and improve functional use of RUE.    OT Occupational Profile and History Detailed Assessment-  Review of Records and additional review of physical, cognitive, psychosocial history related to current functional performance    Occupational performance deficits (Please refer to evaluation for details): ADL's;IADL's;Rest and Sleep;Social Participation    Body Structure / Function / Physical Skills ADL;ROM;Balance;IADL;Improper spinal/pelvic alignment;Body mechanics;Sensation;Mobility;Flexibility;Strength;Coordination;Tone;UE functional use;Pain;Decreased knowledge of use of DME;Proprioception;FMC    Rehab Potential Good    Clinical Decision Making Several treatment options, min-mod task modification necessary    Comorbidities Affecting Occupational Performance: Presence of comorbidities impacting occupational performance    Comorbidities impacting occupational performance description: Adhesive capsulitis Rt shoulder    Modification or Assistance to Complete Evaluation  Min-Moderate modification of tasks or assist with assess necessary to complete eval    OT Frequency 1x / week    OT Duration 8 weeks    OT Treatment/Interventions Self-care/ADL training;Therapeutic exercise;Functional Mobility Training;Aquatic Therapy;Ultrasound;Neuromuscular education;Manual Therapy;Splinting;Therapeutic activities;Coping strategies training;DME and/or AE instruction;Cognitive remediation/compensation;Electrical Stimulation;Moist Heat;Passive range of motion;Patient/family education;Balance training    Plan assess effectiveness of prior splint (he stopped wearing) and assess possibility for elbow extension splint, manula R shoulder followed with NMR    Consulted and Agree with Plan of Care Patient;Family member/caregiver    Family Member Consulted Fiance Andria Frames           Patient will benefit from skilled therapeutic intervention in order to improve the following deficits and impairments:   Body Structure / Function / Physical Skills: ADL,ROM,Balance,IADL,Improper spinal/pelvic alignment,Body  mechanics,Sensation,Mobility,Flexibility,Strength,Coordination,Tone,UE functional use,Pain,Decreased knowledge of use of DME,Proprioception,FMC       Visit Diagnosis: Hemiplegia and hemiparesis following cerebral infarction affecting right dominant side ([place]) - Plan: Ot plan of care cert/re-cert  Spastic hemiparesis of right dominant side as late effect of cerebral infarction [facility]) - Plan: Ot plan of care cert/re-cert  Muscle weakness (generalized) - Plan: Ot plan of care cert/re-cert  Chronic right shoulder pain - Plan: Ot plan of care cert/re-cert  Unsteadiness on feet - Plan: Ot plan of care cert/re-cert    Problem List Patient Active Problem List   Diagnosis Date Noted  . Chronic right shoulder pain 06/22/2020  . Adhesive capsulitis of right shoulder 10/07/2019  . Abnormality of gait 09/17/2019  . Spastic hemiparesis of right dominant side ([place]) 07/02/2019  . History of CVA with residual deficit 06/09/2019  . Spastic hemiparesis ([place])   . Uncontrolled type 2 diabetes mellitus with hyperglycemia ([place])   . Noncompliance   . Benign essential HTN   . Newly diagnosed diabetes ([place])   . Leucocytosis   . Right hemiparesis ([place])   . Basal ganglia stroke ([place]) 03/04/2019  . Stroke ([place]) 03/10/19  . Essential hypertension 03-10-2019  . Tobacco abuse 10-Mar-2019  . Hyperglycemia 03-10-19  Managed medicaid CPT codes: 41324- Therapeutic Exercise, [phone]- Neuro Re-education, [phone] - Manual Therapy, [address]30 - Therapeutic Activities, [phone] - Self Care, [phone] - Electrical stimulation (Manual), [id number] - Ultrasound and [phone] - Orthotic Fit   Mariah Milling, OTR/L 08/19/2020, 6:14 PM  [facility] [address] [place], [place], [address] Phone: [phone]   Fax:  [phone]  Name: Armanie Martine MRN: [id number] Date of Birth: [birth year]

## 2020-08-19 NOTE — Therapy (Signed)
[facility] [address] [place], [place], [address] Phone: [phone]   Fax:  [phone]  Physical Therapy Evaluation  Patient Details  Name: Casey Petty MRN: [id number] Date of Birth: [birth year] Referring Provider (PT): [name], NP   Encounter Date: 08/19/2020   PT End of Session - 08/19/20 58    Visit Number 1    Number of Visits 9    Date for PT Re-Evaluation --   POC for 8 weeks   Authorization Type [employer] Medicaid (VL: 58)    PT Start Time 1101    PT Stop Time 1142    PT Time Calculation (min) 58 min    Equipment Utilized During Treatment Gait belt    Activity Tolerance Patient tolerated treatment well    Behavior During Therapy [facility] for tasks assessed/performed;Restless           Past Medical History:  Diagnosis Date  . Hypertension     Past Surgical History:  Procedure Laterality Date  . NO PAST SURGERIES      There were no vitals filed for this visit.    Subjective Assessment - 08/19/20 1118    Subjective Patient returns to physical therapy to further work on balance and walking. Patient reports no changes since last time that patient was here, would jut like to continue to work to get moving better. Patient reports no falls since patient was last visit. Did recently recived botox injections on 1/6 from Dr. Posey Pronto. Toe cap also has come off the R shoe and would like to get a new one. Still ambulating with SPC with quad tip attachment.    Patient is accompained by: Family member    Pertinent History HTN    Limitations Standing;Walking;House hold activities    Patient Stated Goals Get moving better.    Currently in Pain? No/denies              [facility] PT Assessment - 08/19/20 0001      Assessment   Medical Diagnosis R Spastic Hemiparesis    Referring Provider (PT) [name], NP    Onset Date/Surgical Date 08/11/19    Hand Dominance Right    Next MD Visit 09/25/19       Precautions   Precautions Fall    Required Braces or Orthoses Other Brace/Splint    Other Brace/Splint Patient still wearing R AFO. Did have toe cap on R Shoe but fell off.      Balance Screen   Has the patient fallen in the past 6 months No    Has the patient had a decrease in activity level because of a fear of falling?  No    Is the patient reluctant to leave their home because of a fear of falling?  No      Home Social worker Private residence    Living Arrangements Spouse/significant other    Available Help at Discharge Family    Type of [place] - single point;Wheelchair - [employer] - 2 wheels;Tub bench    Additional Comments steps to go out the back porch, but patient does not go out there.      Prior Function   Level of Independence Independent with household mobility with device;Other (comment)   currently using wheelchair out in the community     Cognition   Overall Cognitive Status  Within Functional Limits for tasks assessed      Sensation   Light Touch Impaired by gross assessment    Additional Comments dimished sensation on RLE      Coordination   Gross Motor Movements are Fluid and Coordinated No    Coordination and Movement Description decreased coordination grossly due to L hemiparesis      ROM / Strength   AROM / PROM / Strength AROM;Strength      AROM   Overall AROM  Deficits    Overall AROM Comments decreased ROM in R Knee due to spasticity/weakness. no formal measurements taken      Strength   Overall Strength Deficits    Overall Strength Comments overall decreased strength in RLE.      Bed Mobility   Bed Mobility --   independent with bed mobility     Transfers   Transfers Sit to Stand;Stand to Sit    Sit to Stand 5: Supervision    Five time sit to stand comments  58.34 secs without UE support    Stand to Sit 5: Supervision    Comments  completed from w/c. PT having to stabilize w/c due to brakes not working properly.      Ambulation/Gait   Ambulation/Gait Yes    Ambulation/Gait Assistance 5: Supervision    Ambulation/Gait Assistance Details completed ambulation with SPC with quad tip attachment, supervision throughout. Paitent continue to demo increased circumduction with RLE. completed all ambulation with R AFO    Ambulation Distance (Feet) 115 Feet    Assistive device Straight cane   with quad tip attachment   Gait Pattern Step-through pattern;Decreased arm swing - right;Decreased step length - right;Decreased step length - left;Decreased stance time - right;Decreased hip/knee flexion - right;Decreased weight shift to right;Poor foot clearance - right    Ambulation Surface Level;Indoor    Gait velocity 16.02 secs = 58.04 ft/sec      Standardized Balance Assessment   Standardized Balance Assessment Berg Balance Test;Timed Up and Go Test      Berg Balance Test   Sit to Stand Able to stand without using hands and stabilize independently    Standing Unsupported Able to stand safely 2 minutes    Sitting with Back Unsupported but Feet Supported on Floor or Stool Able to sit safely and securely 2 minutes    Stand to Sit Sits safely with minimal use of hands    Transfers Able to transfer safely, definite need of hands    Standing Unsupported with Eyes Closed Able to stand 10 seconds safely    Standing Unsupported with Feet Together Able to place feet together independently and stand 1 minute safely    From Standing, Reach Forward with Outstretched Arm Can reach forward >12 cm safely (5")    From Standing Position, Pick up Object from Floor Able to pick up shoe safely and easily    From Standing Position, Turn to Look Behind Over each Shoulder Looks behind one side only/other side shows less weight shift    Turn 360 Degrees Able to turn 360 degrees safely but slowly    Standing Unsupported, Alternately Place Feet on Step/Stool  Able to stand independently and complete 8 steps >20 seconds    Standing Unsupported, One Foot in Front Able to take small step independently and hold 30 seconds    Standing on One Leg Able to lift leg independently and hold 5-10 seconds    Total Score 47    Berg comment: 47/56  Timed Up and Go Test   TUG Normal TUG    Normal TUG (seconds) 58.25   with SPC with quad tip attachment            Objective measurements completed on examination: See above findings.       PT Education - 08/19/20 1157    Education Details Educated on evaluation findings/POC.    Person(s) Educated Patient    Methods Explanation    Comprehension Verbalized understanding            PT Short Term Goals - 08/19/20 1207      PT SHORT TERM GOAL #1   Title Patient will be independent with initial HEP for strengthening/balance (All STGS due at 5th visit)    Baseline prior HEP provided    Time 4    Period --   visits   Status New      PT SHORT TERM GOAL #2   Title Patient will improve 5x sit <> stand to <12 seconds without UE support to demonstrate improved balance    Baseline 14.34 secs    Time 4    Period --   visits   Status New      PT SHORT TERM GOAL #3   Title Patient will improve gait speed to >/= 2.3 ft/sec to demosntrate improved community ambulation    Baseline 2.04 ft/sec    Time 4    Period --   visits   Status New             PT Long Term Goals - 08/19/20 1209      PT LONG TERM GOAL #1   Title Patient will be independent with final HEP for maintaining functional gains and improved balance/strength (All LTGs due at 9th visit)    Baseline prior HEP provided    Time 8    Period --   visits   Status New      PT LONG TERM GOAL #2   Title Patient will improve Berg Balance Score from 47/56 to >/= 51/56 to demonstrate improved balance and reduce risk of falls    Baseline 47/56    Time 8    Period --   visits   Status New      PT LONG TERM GOAL #3   Title Patient will  improve TUG to </= 10 seconds to demosntrate reduced fall risk    Baseline 13.25 secs    Time 8    Period --   visits   Status New      PT LONG TERM GOAL #4   Title Patient will improve gait speed to >/= 3.0 ft/sec to demonstrate improved community mobility    Baseline 2.04 ft/sec    Time 8    Period --   visits   Status New      PT LONG TERM GOAL #5   Title Patient will improve 5 sit <>stand to </=10 seconds for improve BLE strength and reduce risk for falls    Baseline 14.34 secs    Time 8    Period --   visits   Status New                  Plan - 08/19/20 1159    Clinical Impression Statement Patient is a 60 y.o. male that was referred to Neuro OPPT services for R Spastic Hemiparesis residual from CVA in 02/2019. Patient's PMH includes HTN. Upon evaluation patient presents with the following impairments:  abnormal tone, impaired sensation, decreased strength, decreased ROM, abnormal gait, and increased fall risk. Patient is currently using a cane for ambulation inside of the home and using a manual wheelchair for longer distance outside of the home. Patient demonstrates at an increased risk for falls with results of TUG, Berg Balance and 5x sit <> stand with today's assessment. Patient is ambulating at 2.04 ft/sec demonstrating limited community ambulator. Patient will benefit from skilled PT services to address impairments listed above and reduce fall risk.    Personal Factors and Comorbidities Comorbidity 1;Time since onset of injury/illness/exacerbation    Comorbidities HTN    Examination-Activity Limitations Squat;Stairs;Transfers;Locomotion Level;Lift;Reach Overhead    Examination-Participation Restrictions Community Activity    Stability/Clinical Decision Making Stable/Uncomplicated    Clinical Decision Making Low    Rehab Potential Fair    PT Frequency 1x / week    PT Duration 8 weeks   plus eval   PT Treatment/Interventions ADLs/Self Care Home  Management;Cryotherapy;Electrical Stimulation;Moist Heat;DME Instruction;Gait training;Stair training;Functional mobility training;Therapeutic activities;Therapeutic exercise;Balance training;Neuromuscular re-education;Patient/family education;Orthotic Fit/Training;Manual techniques;Passive range of motion    PT Next Visit Plan Review prior HEP. Completed activites to promote improved knee/hip flexion.    Recommended Other Services Occupational Therapy    Consulted and Agree with Plan of Care Patient           Patient will benefit from skilled therapeutic intervention in order to improve the following deficits and impairments:  Abnormal gait,Decreased balance,Decreased endurance,Difficulty walking,Increased muscle spasms,Impaired sensation,Impaired tone,Improper body mechanics,Pain,Postural dysfunction,Decreased strength,Decreased safety awareness,Decreased knowledge of use of DME,Decreased activity tolerance,Decreased coordination,Decreased range of motion  Visit Diagnosis: Muscle weakness (generalized)  Unsteadiness on feet  Other abnormalities of gait and mobility     Problem List Patient Active Problem List   Diagnosis Date Noted  . Chronic right shoulder pain 06/22/2020  . Adhesive capsulitis of right shoulder 10/07/2019  . Abnormality of gait 09/17/2019  . Spastic hemiparesis of right dominant side ([place]) 07/02/2019  . History of CVA with residual deficit 06/09/2019  . Spastic hemiparesis ([place])   . Uncontrolled type 2 diabetes mellitus with hyperglycemia ([place])   . Noncompliance   . Benign essential HTN   . Newly diagnosed diabetes ([place])   . Leucocytosis   . Right hemiparesis ([place])   . Basal ganglia stroke ([place]) 03/04/2019  . Stroke ([place]) 02/28/2019  . Essential hypertension 02/28/2019  . Tobacco abuse 02/28/2019  . Hyperglycemia 02/28/2019    Managed medicaid CPT codes: 14481- Therapeutic Exercise, [phone]- Neuro Re-education, [phone] - Gait Training, [phone] - Manual  Therapy, [phone] - Therapeutic Activities, [phone] - Self Care, [phone] - Electrical stimulation (Manual) and [id number] - Orthotic Fit      Jones Bales, PT, DPT 08/19/2020, 12:16 PM  [facility] [address] [place], [place], [address] Phone: [phone]   Fax:  [phone]  Name: Sederick Jacobsen MRN: [id number] Date of Birth: 05-23-1961

## 2020-08-20 DIAGNOSIS — I639 Cerebral infarction, unspecified: Secondary | ICD-10-CM | POA: Diagnosis not present

## 2020-08-21 DIAGNOSIS — I639 Cerebral infarction, unspecified: Secondary | ICD-10-CM | POA: Diagnosis not present

## 2020-08-21 MED FILL — ATORVASTATIN CALCIUM 80 MG: 80 | 15 days supply | Qty: 15 | Fill #1

## 2020-08-21 MED FILL — AMLODIPINE BESYLATE 10 MG T: 10 | 15 days supply | Qty: 15 | Fill #1

## 2020-08-22 DIAGNOSIS — I639 Cerebral infarction, unspecified: Secondary | ICD-10-CM | POA: Diagnosis not present

## 2020-08-24 DIAGNOSIS — I639 Cerebral infarction, unspecified: Secondary | ICD-10-CM | POA: Diagnosis not present

## 2020-08-25 DIAGNOSIS — I639 Cerebral infarction, unspecified: Secondary | ICD-10-CM | POA: Diagnosis not present

## 2020-08-26 DIAGNOSIS — I639 Cerebral infarction, unspecified: Secondary | ICD-10-CM | POA: Diagnosis not present

## 2020-08-27 DIAGNOSIS — I639 Cerebral infarction, unspecified: Secondary | ICD-10-CM | POA: Diagnosis not present

## 2020-08-28 DIAGNOSIS — I639 Cerebral infarction, unspecified: Secondary | ICD-10-CM | POA: Diagnosis not present

## 2020-08-29 DIAGNOSIS — I639 Cerebral infarction, unspecified: Secondary | ICD-10-CM | POA: Diagnosis not present

## 2020-08-31 DIAGNOSIS — I639 Cerebral infarction, unspecified: Secondary | ICD-10-CM | POA: Diagnosis not present

## 2020-09-01 ENCOUNTER — Other Ambulatory Visit: Payer: Self-pay

## 2020-09-01 ENCOUNTER — Ambulatory Visit: Payer: Medicaid Other | Attending: Family Medicine | Admitting: Family Medicine

## 2020-09-01 ENCOUNTER — Other Ambulatory Visit: Payer: Self-pay | Admitting: Family Medicine

## 2020-09-01 VITALS — BP 145/81 | HR 82 | Ht 67.0 in | Wt 157.0 lb

## 2020-09-01 DIAGNOSIS — I6930 Unspecified sequelae of cerebral infarction: Secondary | ICD-10-CM

## 2020-09-01 DIAGNOSIS — I10 Essential (primary) hypertension: Secondary | ICD-10-CM | POA: Diagnosis not present

## 2020-09-01 DIAGNOSIS — E1165 Type 2 diabetes mellitus with hyperglycemia: Secondary | ICD-10-CM | POA: Diagnosis not present

## 2020-09-01 DIAGNOSIS — Z1159 Encounter for screening for other viral diseases: Secondary | ICD-10-CM

## 2020-09-01 DIAGNOSIS — I639 Cerebral infarction, unspecified: Secondary | ICD-10-CM | POA: Diagnosis not present

## 2020-09-01 LAB — GLUCOSE, POCT (MANUAL RESULT ENTRY)
POC Glucose: 455 mg/dL — AB (ref 70–99)
POC Glucose: 393 mg/dl — AB (ref 70–99)

## 2020-09-01 MED ORDER — MISC. DEVICES MISC: 0 refills | Status: DC

## 2020-09-01 MED ORDER — AMLODIPINE BESYLATE 10 MG PO TABS: 10.0000 mg | ORAL_TABLET | Freq: Every day | ORAL | 1 refills | Status: DC

## 2020-09-01 MED ORDER — EZETIMIBE 10 MG PO TABS: 10.0000 mg | ORAL_TABLET | Freq: Every day | ORAL | 1 refills | Status: DC

## 2020-09-01 MED ORDER — INSULIN ASPART 100 UNIT/ML ~~LOC~~ SOLN
15.0000 [IU] | Freq: Once | SUBCUTANEOUS | Status: AC
  Administered 2020-09-01: 15 [IU] via SUBCUTANEOUS

## 2020-09-01 MED ORDER — CARVEDILOL 12.5 MG PO TABS: 12.5000 mg | ORAL_TABLET | Freq: Two times a day (BID) | ORAL | 1 refills | Status: DC

## 2020-09-01 MED ORDER — HYDRALAZINE HCL 100 MG PO TABS: 100.0000 mg | ORAL_TABLET | Freq: Three times a day (TID) | ORAL | 1 refills | Status: DC

## 2020-09-01 MED ORDER — EMPAGLIFLOZIN 10 MG PO TABS: 10.0000 mg | ORAL_TABLET | Freq: Every day | ORAL | 1 refills | Status: DC

## 2020-09-01 MED ORDER — ATORVASTATIN CALCIUM 80 MG PO TABS
ORAL_TABLET | ORAL | 1 refills | Status: DC
Start: 2020-09-01 — End: 2021-01-19

## 2020-09-01 MED ORDER — LANTUS SOLOSTAR 100 UNIT/ML ~~LOC~~ SOPN: 10.0000 [IU] | PEN_INJECTOR | Freq: Every day | SUBCUTANEOUS | 6 refills | Status: DC

## 2020-09-01 MED FILL — LANTUS SOLOSTAR 100 UNITS/M: 100 | 90 days supply | Qty: 9 | Fill #0

## 2020-09-01 MED FILL — hydrALAZINE HCL 100 MG TABS: 100 | 90 days supply | Qty: 270 | Fill #0

## 2020-09-01 MED FILL — CARVEDILOL 12.5 MG TABLET: 12.5 | 90 days supply | Qty: 180 | Fill #0

## 2020-09-01 NOTE — Progress Notes (Signed)
Subjective:  Patient ID: Casey Petty, male    DOB: 02/18/1961  Age: 60 y.o. MRN: [id number]  CC: Diabetes   HPI Casey Petty is a 60 year old male with a history of type 2 diabetes mellitus (A1c 14.6) stroke with right spastic hemiparesis) and Botox injections by rehab), right adhesive capsulitis, dysarthria accompanied by his Fiance He needs a new wheelchair.  BP was 126/74 at Neurologist but is slightly elevated today and he endorses compliance with his antihypertensive.  His diabetes is uncontrolled with an A1c of 14.6 which is trended up from 12.2 previously and he endorses compliance with Jardiance.  His blood sugar in the clinic is 455 and this is 2 hours postprandial. He would like a CGM as he is unable to check his blood sugars given his right hemiparesis. He has no additional concerns today.  Past Medical History:  Diagnosis Date  . Hypertension     Past Surgical History:  Procedure Laterality Date  . NO PAST SURGERIES      Family History  Problem Relation Age of Onset  . Heart attack Father   . Cancer Neg Hx     Allergies  Allergen Reactions  . Lisinopril Swelling    Angioedema- lips/face    Outpatient Medications Prior to Visit  Medication Sig Dispense Refill  . acetaminophen (TYLENOL) 325 MG tablet Take 1-2 tablets (325-650 mg total) by mouth every 4 (four) hours as needed for mild pain.    Marland Kitchen aspirin 81 MG EC tablet Take 1 tablet (81 mg total) by mouth daily. 100 tablet 0  . baclofen (LIORESAL) 20 MG tablet Take 1.5 tablets (30 mg total) by mouth 3 (three) times daily. 90 each 1  . Blood Glucose Monitoring Suppl (TRUE METRIX METER) w/Device KIT Check blood sugar fasting and before meals and again if pt feels bad (symptoms of hypo). 1 kit 0  . clotrimazole (LOTRIMIN) 1 % cream Apply to affected area 2 times daily for the next 7 days. 15 g 0  . diclofenac Sodium (VOLTAREN) 1 % GEL Apply 2 g topically 4 (four) times daily. 350 g 1  . famotidine (PEPCID)  20 MG tablet Take 1 tablet (20 mg total) by mouth daily. 30 tablet 0  . glucose blood (TRUE METRIX BLOOD GLUCOSE TEST) test strip Use as instructed 100 each 12  . TRUEplus Lancets 28G MISC Check blood sugar fasting and before meals and again if pt feels bad (symptoms of hypo). 100 each 12  . amLODipine (NORVASC) 10 MG tablet TAKE 1 TABLET (10 MG TOTAL) BY MOUTH DAILY. TO LOWER BLOOD PRESSURE 45 tablet 0  . atorvastatin (LIPITOR) 80 MG tablet TAKE 1 TABLET BY MOUTH IN THE EVENINGS TO LOWER CHOLESTEROL 45 tablet 0  . carvedilol (COREG) 12.5 MG tablet Take 1 tablet (12.5 mg total) by mouth 2 (two) times daily with a meal. For blood pressure 180 tablet 3  . empagliflozin (JARDIANCE) 10 MG TABS tablet Take 10 mg by mouth daily before breakfast. 90 tablet 1  . ezetimibe (ZETIA) 10 MG tablet Take 1 tablet (10 mg total) by mouth daily. 90 tablet 3  . hydrALAZINE (APRESOLINE) 100 MG tablet TAKE 1 TABLET (100 MG TOTAL) BY MOUTH EVERY 8 (EIGHT) HOURS. TO LOWER BLOOD PRESSURE 120 tablet 0   No facility-administered medications prior to visit.     ROS Review of Systems  Constitutional: Negative for activity change and appetite change.  HENT: Negative for sinus pressure and sore throat.   Eyes: Negative for visual  disturbance.  Respiratory: Negative for cough, chest tightness and shortness of breath.   Cardiovascular: Negative for chest pain and leg swelling.  Gastrointestinal: Negative for abdominal distention, abdominal pain, constipation and diarrhea.  Endocrine: Negative.   Genitourinary: Negative for dysuria.  Musculoskeletal: Negative for joint swelling and myalgias.  Skin: Negative for rash.  Allergic/Immunologic: Negative.   Neurological: Negative for weakness, light-headedness and numbness.  Psychiatric/Behavioral: Negative for dysphoric mood and suicidal ideas.    Objective:  BP (!) 145/81   Pulse 82   Ht $R'5\' 7"'ah$  (1.702 m)   Wt 157 lb (71.2 kg)   SpO2 98%   BMI 24.59 kg/m    BP/Weight 09/01/2020 11/11/6254 10/14/9371  Systolic BP 428 768 115  Diastolic BP 81 92 77  Wt. (Lbs) 157 165 -  BMI 24.59 25.84 -      Physical Exam Constitutional:      Appearance: He is well-developed.  Neck:     Vascular: No JVD.  Cardiovascular:     Rate and Rhythm: Normal rate.     Heart sounds: Normal heart sounds. No murmur heard.   Pulmonary:     Effort: Pulmonary effort is normal.     Breath sounds: Normal breath sounds. No wheezing or rales.  Chest:     Chest wall: No tenderness.  Abdominal:     General: Bowel sounds are normal. There is no distension.     Palpations: Abdomen is soft. There is no mass.     Tenderness: There is no abdominal tenderness.  Musculoskeletal:     Right lower leg: No edema.     Left lower leg: No edema.  Neurological:     Mental Status: He is alert and oriented to person, place, and time.     Gait: Gait abnormal.     Comments: Right facial droop Dysarthria Right spastic hemiparesis  Psychiatric:        Mood and Affect: Mood normal.     CMP Latest Ref Rng & Units 07/24/2019 05/08/2019 03/18/2019  Glucose 65 - 99 mg/dL 152(H) 134(H) 203(H)  BUN 6 - 24 mg/dL $Remove'8 7 16  'zLNiWFX$ Creatinine 0.76 - 1.27 mg/dL 0.94 0.93 1.02  Sodium 134 - 144 mmol/L 141 138 135  Potassium 3.5 - 5.2 mmol/L 3.9 4.2 4.1  Chloride 96 - 106 mmol/L 103 100 102  CO2 20 - 29 mmol/L 23 23 18(L)  Calcium 8.7 - 10.2 mg/dL 10.3(H) 10.1 9.7  Total Protein 6.0 - 8.5 g/dL 7.1 7.2 -  Total Bilirubin 0.0 - 1.2 mg/dL 0.6 0.6 -  Alkaline Phos 39 - 117 IU/L 105 99 -  AST 0 - 40 IU/L 19 19 -  ALT 0 - 44 IU/L 29 20 -    Lipid Panel     Component Value Date/Time   CHOL 94 (L) 07/13/2020 0811   TRIG 108 07/13/2020 0811   HDL 28 (L) 07/13/2020 0811   CHOLHDL 3.4 07/13/2020 0811   CHOLHDL 3.5 07/18/2014 0922   VLDL NOT CALC 07/18/2014 0922   LDLCALC 46 07/13/2020 0811    CBC    Component Value Date/Time   WBC 10.2 03/18/2019 1123   RBC 5.00 03/18/2019 1123   HGB 15.2  03/18/2019 1123   HCT 46.2 03/18/2019 1123   PLT 278 03/18/2019 1123   MCV 92.4 03/18/2019 1123   MCH 30.4 03/18/2019 1123   MCHC 32.9 03/18/2019 1123   RDW 12.8 03/18/2019 1123   LYMPHSABS 3.1 03/05/2019 0702   MONOABS 1.6 (H) 03/05/2019  0702   EOSABS 0.0 03/05/2019 0702   BASOSABS 0.0 03/05/2019 2202    Lab Results  Component Value Date   HGBA1C 14.6 (H) 07/13/2020    Assessment & Plan:  1. Uncontrolled type 2 diabetes mellitus with hyperglycemia (HCC) Uncontrolled with A1c of 14.6 Lantus added to regimen-he will be unable to administer this due to his right hemiparesis but states his sister who lives with him will be able to assist. Scheduled with the clinical pharmacist for blood sugar log evaluation and up titration of Lantus as needed He will also need a CGM Counseled on Diabetic diet, my plate method, 542 minutes of moderate intensity exercise/week Blood sugar logs with fasting goals of 80-120 mg/dl, random of less than 180 and in the event of sugars less than 60 mg/dl or greater than 400 mg/dl encouraged to notify the clinic. Advised on the need for annual eye exams, annual foot exams, Pneumonia vaccine. - POCT glucose (manual entry) - insulin aspart (novoLOG) injection 15 Units - POCT glucose (manual entry) - insulin glargine (LANTUS SOLOSTAR) 100 UNIT/ML Solostar Pen; Inject 10 Units into the skin daily.  Dispense: 30 mL; Refill: 6 - CMP14+EGFR - Ambulatory referral to Ophthalmology - empagliflozin (JARDIANCE) 10 MG TABS tablet; Take 1 tablet (10 mg total) by mouth daily before breakfast.  Dispense: 90 tablet; Refill: 1  2. Essential hypertension Slightly above goal Counseled on blood pressure goal of less than 130/80, low-sodium, DASH diet, medication compliance, 150 minutes of moderate intensity exercise per week. Discussed medication compliance, adverse effects. - hydrALAZINE (APRESOLINE) 100 MG tablet; Take 1 tablet (100 mg total) by mouth every 8 (eight) hours.  To lower blood pressure  Dispense: 270 tablet; Refill: 1 - amLODipine (NORVASC) 10 MG tablet; Take 1 tablet (10 mg total) by mouth daily. To lower blood pressure  Dispense: 90 tablet; Refill: 1 - carvedilol (COREG) 12.5 MG tablet; Take 1 tablet (12.5 mg total) by mouth 2 (two) times daily with a meal. For blood pressure  Dispense: 180 tablet; Refill: 1  3. History of CVA with residual deficit With right-sided hemiparesis Receiving Botox injection from rehab medicine Risk factor modification Will write prescription for wheelchair - atorvastatin (LIPITOR) 80 MG tablet; TAKE 1 TABLET BY MOUTH IN THE EVENINGS TO LOWER CHOLESTEROL  Dispense: 90 tablet; Refill: 1 - Misc. Devices MISC; Wheelchair with Accessories: elevating leg rests (ELRs), wheel locks, extensions and anti-tippers. Cane or walker will not suffice  Dispense: 1 each; Refill: 0  4. Screening for viral disease - HCV RNA quant rflx ultra or genotyp([facility]/Sunquest) - HIV Antibody (routine testing w rflx)    Meds ordered this encounter  Medications  . insulin aspart (novoLOG) injection 15 Units  . insulin glargine (LANTUS SOLOSTAR) 100 UNIT/ML Solostar Pen    Sig: Inject 10 Units into the skin daily.    Dispense:  30 mL    Refill:  6  . hydrALAZINE (APRESOLINE) 100 MG tablet    Sig: Take 1 tablet (100 mg total) by mouth every 8 (eight) hours. To lower blood pressure    Dispense:  270 tablet    Refill:  1  . ezetimibe (ZETIA) 10 MG tablet    Sig: Take 1 tablet (10 mg total) by mouth daily.    Dispense:  90 tablet    Refill:  1  . amLODipine (NORVASC) 10 MG tablet    Sig: Take 1 tablet (10 mg total) by mouth daily. To lower blood pressure    Dispense:  90 tablet  Refill:  1  . atorvastatin (LIPITOR) 80 MG tablet    Sig: TAKE 1 TABLET BY MOUTH IN THE EVENINGS TO LOWER CHOLESTEROL    Dispense:  90 tablet    Refill:  1  . carvedilol (COREG) 12.5 MG tablet    Sig: Take 1 tablet (12.5 mg total) by mouth 2 (two) times daily  with a meal. For blood pressure    Dispense:  180 tablet    Refill:  1  . empagliflozin (JARDIANCE) 10 MG TABS tablet    Sig: Take 1 tablet (10 mg total) by mouth daily before breakfast.    Dispense:  90 tablet    Refill:  1  . Misc. Devices MISC    Sig: Wheelchair with Accessories: elevating leg rests (ELRs), wheel locks, extensions and anti-tippers. Cane or walker will not suffice    Dispense:  1 each    Refill:  0    Follow-up: Return in about 1 week (around 09/08/2020) for Luke-insulin titration and CGM.  3 months PCP.       Charlott Rakes, MD, FAAFP. [facility] [place], [place]   09/01/2020, 1:08 PM

## 2020-09-01 NOTE — Patient Instructions (Signed)
Insulin Glargine injection What is this medicine? INSULIN GLARGINE (IN su lin GLAR geen) is a human-made form of insulin. This drug lowers the amount of sugar in your blood. It is a long-acting insulin that is usually given once a day. This medicine may be used for other purposes; ask your health care provider or pharmacist if you have questions. COMMON BRAND NAME(S): BASAGLAR, Lantus, Lantus SoloStar, Semglee, Toujeo Max SoloStar, [employer] What should I tell my health care provider before I take this medicine? They need to know if you have any of these conditions:  episodes of low blood sugar  eye disease, vision problems  kidney disease  liver disease  an unusual or allergic reaction to insulin, metacresol, other medicines, foods, dyes, or preservatives  pregnant or trying to get pregnant  breast-feeding How should I use this medicine? This medicine is for injection under the skin. Use this medicine at the same time each day. Use exactly as directed. This insulin should never be mixed in the same syringe with other insulins before injection. Do not vigorously shake before use. You will be taught how to use this medicine and how to adjust doses for activities and illness. Do not use more insulin than prescribed. Always check the appearance of your insulin before using it. This medicine should be clear and colorless like water. Do not use it if it is cloudy, thickened, colored, or has solid particles in it. If you use an insulin pen, be sure to take off the outer needle cover before using the dose. It is important that you put your used needles and syringes in a special sharps container. Do not put them in a trash can. If you do not have a sharps container, call your pharmacist or healthcare provider to get one. This drug comes with INSTRUCTIONS FOR USE. Ask your pharmacist for directions on how to use this drug. Read the information carefully. Talk to your pharmacist or health care  provider if you have questions. Talk to your pediatrician regarding the use of this medicine in children. While this drug may be prescribed for children as young as 6 years for selected conditions, precautions do apply. Overdosage: If you think you have taken too much of this medicine contact a poison control center or emergency room at once. NOTE: This medicine is only for you. Do not share this medicine with others. What if I miss a dose? It is important not to miss a dose. Your health care professional or doctor should discuss a plan for missed doses with you. If you do miss a dose, follow their plan. Do not take double doses. What may interact with this medicine?  other medicines for diabetes Many medications may cause changes in blood sugar, these include:  alcohol containing beverages  antiviral medicines for HIV or AIDS  aspirin and aspirin-like drugs  certain medicines for blood pressure, heart disease, irregular heart beat  chromium  diuretics  male hormones, such as estrogens or progestins, birth control pills  fenofibrate  gemfibrozil  isoniazid  lanreotide  male hormones or anabolic steroids  MAOIs like Carbex, Eldepryl, Marplan, Nardil, and Parnate  medicines for weight loss  medicines for allergies, asthma, cold, or cough  medicines for depression, anxiety, or psychotic disturbances  niacin  nicotine  NSAIDs, medicines for pain and inflammation, like ibuprofen or naproxen  octreotide  pasireotide  pentamidine  phenytoin  probenecid  quinolone antibiotics such as ciprofloxacin, levofloxacin, ofloxacin  some herbal dietary supplements  steroid medicines  such as prednisone or cortisone  sulfamethoxazole; trimethoprim  thyroid hormones Some medications can hide the warning symptoms of low blood sugar (hypoglycemia). You may need to monitor your blood sugar more closely if you are taking one of these medications. These  include:  beta-blockers, often used for high blood pressure or heart problems (examples include atenolol, metoprolol, propranolol)  clonidine  guanethidine  reserpine This list may not describe all possible interactions. Give your health care provider a list of all the medicines, herbs, non-prescription drugs, or dietary supplements you use. Also tell them if you smoke, drink alcohol, or use illegal drugs. Some items may interact with your medicine. What should I watch for while using this medicine? Visit your health care professional or doctor for regular checks on your progress. Do not drive, use machinery, or do anything that needs mental alertness until you know how this medicine affects you. Alcohol may interfere with the effect of this medicine. Avoid alcoholic drinks. A test called the HbA1C (A1C) will be monitored. This is a simple blood test. It measures your blood sugar control over the last 2 to 3 months. You will receive this test every 3 to 6 months. Learn how to check your blood sugar. Learn the symptoms of low and high blood sugar and how to manage them. Always carry a quick-source of sugar with you in case you have symptoms of low blood sugar. Examples include hard sugar candy or glucose tablets. Make sure others know that you can choke if you eat or drink when you develop serious symptoms of low blood sugar, such as seizures or unconsciousness. They must get medical help at once. Tell your doctor or health care professional if you have high blood sugar. You might need to change the dose of your medicine. If you are sick or exercising more than usual, you might need to change the dose of your medicine. Do not skip meals. Ask your doctor or health care professional if you should avoid alcohol. Many nonprescription cough and cold products contain sugar or alcohol. These can affect blood sugar. Make sure that you have the right kind of syringe for the type of insulin you use. Try not  to change the brand and type of insulin or syringe unless your health care professional or doctor tells you to. Switching insulin brand or type can cause dangerously high or low blood sugar. Always keep an extra supply of insulin, syringes, and needles on hand. Use a syringe one time only. Throw away syringe and needle in a closed container to prevent accidental needle sticks. Insulin pens and cartridges should never be shared. Even if the needle is changed, sharing may result in passing of viruses like hepatitis or HIV. Each time you get a new box of pen needles, check to see if they are the same type as the ones you were trained to use. If not, ask your health care professional to show you how to use this new type properly. Wear a medical ID bracelet or chain, and carry a card that describes your disease and details of your medicine and dosage times. What side effects may I notice from receiving this medicine? Side effects that you should report to your doctor or health care professional as soon as possible:  allergic reactions like skin rash, itching or hives, swelling of the face, lips, or tongue  breathing problems  signs and symptoms of high blood sugar such as dizziness, dry mouth, dry skin, fruity breath, nausea, stomach pain,  increased hunger or thirst, increased urination  signs and symptoms of low blood sugar such as feeling anxious, confusion, dizziness, increased hunger, unusually weak or tired, sweating, shakiness, cold, irritable, headache, blurred vision, fast heartbeat, loss of consciousness Side effects that usually do not require medical attention (report to your doctor or health care professional if they continue or are bothersome):  increase or decrease in fatty tissue under the skin due to overuse of a particular injection site  itching, burning, swelling, or rash at site where injected This list may not describe all possible side effects. Call your doctor for medical advice  about side effects. You may report side effects to FDA at 1-800-FDA-1088. Where should I keep my medicine? Keep out of the reach of children. Unopened Vials: Lantus vials: Store in a refrigerator between 2 and 8 degrees C (36 and 46 degrees F) or at room temperature below 30 degrees C (86 degrees F). Do not freeze or use if the insulin has been frozen. Protect from light and excessive heat. If stored at room temperature, the vial must be discarded after 28 days. Throw away any unopened and unused medicine that has been stored in the refrigerator after the expiration date. Unopened Pens: Neurosurgeon: Store in a refrigerator between 2 and 8 degrees C (36 and 46 degrees F) or at room temperature below 30 degrees C (86 degrees F). Do not freeze or use if the insulin has been frozen. Protect from light and excessive heat. If stored at room temperature, the pen must be discarded after 28 days. Throw away any unopened and unused medicine that has been stored in the refrigerator after the expiration date. Lantus Solostar Pens: Store in a refrigerator between 2 and 8 degrees C (36 and 46 degrees F) or at room temperature below 30 degrees C (86 degrees F). Do not freeze or use if the insulin has been frozen. Protect from light and excessive heat. If stored at room temperature, the pen must be discarded after 28 days. Throw away any unopened and unused medicine that has been stored in the refrigerator after the expiration date. Semglee Pens: Store in a refrigerator between 2 and 8 degrees C (36 and 46 degrees F) or at room temperature below 30 degrees C (86 degrees F). Do not freeze or use if the insulin has been frozen. Protect from light and excessive heat. If stored at room temperature, the pen must be discarded after 28 days. Throw away any unopened and unused medicine that has been stored in the refrigerator after the expiration date. Toujeo Solostar Pens or Toujeo Max [employer] Pens: Store in a refrigerator  between 2 and 8 degrees C (36 and 46 degrees F). Do not freeze or use if the insulin has been frozen. Protect from light and excessive heat. Throw away any unopened and unused medicine that has been stored in the refrigerator after the expiration date. Vials that you are using: Lantus vials: Store in a refrigerator or at room temperature below 30 degrees C (86 degrees F). Do not freeze. Keep away from heat and light. Throw the opened vial away after 28 days. Semglee vials: Store in a refrigerator or at room temperature below 30 degrees C (86 degrees F). Do not freeze. Keep away from heat and light. Throw the opened vial away after 28 days. Pens that you are using: Basaglar KwikPens: Store at room temperature below 30 degrees C (86 degrees F). Do not refrigerate or freeze. Keep away from heat and light.  Throw the pen away after 28 days, even if it still has insulin left in it. Lantus Solostar Pens: Store at room temperature below 30 degrees C (86 degrees F). Do not refrigerate or freeze. Keep away from heat and light. Throw the pen away after 28 days, even if it still has insulin left in it. Semglee Pens: Store at room temperature below 30 degrees C (86 degrees F). Do not refrigerate or freeze. Keep away from heat and light. Throw the pen away after 28 days, even if it still has insulin left in it. Toujeo Solostar Pens or Toujeo Max [employer] Pens: Store at room temperature below 30 degrees C (86 degrees F). Do not refrigerate or freeze. Keep away from heat and light. Throw the pen away after 56 days, even if it still has insulin left in it. NOTE: This sheet is a summary. It may not cover all possible information. If you have questions about this medicine, talk to your doctor, pharmacist, or health care provider.  2021 Elsevier/Gold Standard (2019-05-15 15:00:23)

## 2020-09-01 NOTE — Progress Notes (Signed)
Requesting new wheelchair.

## 2020-09-02 ENCOUNTER — Encounter: Payer: Self-pay | Admitting: Physical Therapy

## 2020-09-02 ENCOUNTER — Ambulatory Visit: Payer: Medicaid Other | Admitting: Physical Therapy

## 2020-09-02 ENCOUNTER — Ambulatory Visit: Payer: Medicaid Other | Admitting: Occupational Therapy

## 2020-09-02 DIAGNOSIS — M6281 Muscle weakness (generalized): Secondary | ICD-10-CM

## 2020-09-02 DIAGNOSIS — I639 Cerebral infarction, unspecified: Secondary | ICD-10-CM | POA: Diagnosis not present

## 2020-09-02 DIAGNOSIS — R2681 Unsteadiness on feet: Secondary | ICD-10-CM

## 2020-09-02 DIAGNOSIS — R2689 Other abnormalities of gait and mobility: Secondary | ICD-10-CM

## 2020-09-02 DIAGNOSIS — I69351 Hemiplegia and hemiparesis following cerebral infarction affecting right dominant side: Secondary | ICD-10-CM

## 2020-09-02 NOTE — Therapy (Signed)
[facility] [address] [place], [place], [address] Phone: [phone]   Fax:  [phone]  Physical Therapy Treatment  Patient Details  Name: Casey Petty MRN: [id number] Date of Birth: Dec 19, 1960 Referring Provider (PT): [name], NP   Encounter Date: 09/02/2020   PT End of Session - 09/02/20 0807    Visit Number 2    Number of Visits 9    Date for PT Re-Evaluation --   POC for 8 weeks   Authorization Type [employer] Medicaid (VL: 27)    PT Start Time 0803    PT Stop Time 0845    PT Time Calculation (min) 42 min    Equipment Utilized During Treatment Gait belt    Activity Tolerance Patient tolerated treatment well    Behavior During Therapy [facility] for tasks assessed/performed;Restless           Past Medical History:  Diagnosis Date  . Hypertension     Past Surgical History:  Procedure Laterality Date  . NO PAST SURGERIES      There were no vitals filed for this visit.   Subjective Assessment - 09/02/20 0806    Subjective No new complaints. No falls or pain to report.    Patient is accompained by: Family member    Pertinent History HTN    Limitations Standing;Walking;House hold activities    Currently in Pain? No/denies                [facility] Adult PT Treatment/Exercise - 09/02/20 0809      Transfers   Transfers Sit to Stand;Stand to Sit    Sit to Stand 5: Supervision    Stand to Sit 5: Supervision      Ambulation/Gait   Ambulation/Gait Yes    Ambulation/Gait Assistance 5: Supervision    Ambulation/Gait Assistance Details from wheelchair to mat table with cues for posture (~8 feet).    Assistive device Straight cane    Gait Pattern Step-through pattern;Decreased arm swing - right;Decreased step length - right;Decreased step length - left;Decreased stance time - right;Decreased hip/knee flexion - right;Decreased weight shift to right;Poor foot clearance - right    Ambulation Surface  Level;Indoor      Exercises   Exercises Other Exercises    Other Exercises  Reviewed HEP from previous episode of care. Advanced to green theraband, removed sidelying clam shells due to poor technique with a lot of cues needed. Advanced seated marching to standing with UE support to promote pt stay more forward/decreased trunk compensation. Also advanced supported mini squats to unsupported with stable surface in front and chair behind pt for safety. Lastly advanced sit<>stands to have feet in stride position with right foot back/left foot forward.           Issued the following to HEP today:   Access Code: [email or web address] URL: [email or web address] Date: 09/02/2020 Prepared by: Willow Ora  Exercises Supine Bridge with Resistance Band - 1 x daily - 5 x weekly - 10 reps - 2 sets Hooklying Single Leg Bent Knee Fallouts with Resistance - 1 x daily - 5 x weekly - 2 sets - 10 reps Seated Ankle Plantarflexion with Resistance - 1 x daily - 5 x weekly - 1 sets - 10 reps Seated Hamstring Stretch - 1 x daily - 5 x weekly - 1 sets - 3 reps - 30 hold Sit to/from Stand in Stride position - 1 x daily - 5 x weekly - 1 sets - 10 reps Standing March  with Unilateral Counter Support - 1 x daily - 5 x weekly - 1 sets - 10 reps Mini Squat - 1 x daily - 5 x weekly - 1 sets - 10 reps Side Stepping with Counter Support - 1 x daily - 7 x weekly - 10 reps - 3 sets Backward Walking with Counter Support - 1 x daily - 7 x weekly - 10 reps - 3 sets        PT Short Term Goals - 08/19/20 1207      PT SHORT TERM GOAL #1   Title Patient will be independent with initial HEP for strengthening/balance (All STGS due at 5th visit)    Baseline prior HEP provided    Time 4    Period --   visits   Status New      PT SHORT TERM GOAL #2   Title Patient will improve 5x sit <> stand to <12 seconds without UE support to demonstrate improved balance    Baseline 14.34 secs    Time 4    Period --   visits    Status New      PT SHORT TERM GOAL #3   Title Patient will improve gait speed to >/= 2.3 ft/sec to demosntrate improved community ambulation    Baseline 2.04 ft/sec    Time 4    Period --   visits   Status New             PT Long Term Goals - 08/19/20 1209      PT LONG TERM GOAL #1   Title Patient will be independent with final HEP for maintaining functional gains and improved balance/strength (All LTGs due at 9th visit)    Baseline prior HEP provided    Time 8    Period --   visits   Status New      PT LONG TERM GOAL #2   Title Patient will improve Berg Balance Score from 47/56 to >/= 51/56 to demonstrate improved balance and reduce risk of falls    Baseline 47/56    Time 8    Period --   visits   Status New      PT LONG TERM GOAL #3   Title Patient will improve TUG to </= 10 seconds to demosntrate reduced fall risk    Baseline 13.25 secs    Time 8    Period --   visits   Status New      PT LONG TERM GOAL #4   Title Patient will improve gait speed to >/= 3.0 ft/sec to demonstrate improved community mobility    Baseline 2.04 ft/sec    Time 8    Period --   visits   Status New      PT LONG TERM GOAL #5   Title Patient will improve 5 sit <>stand to </=10 seconds for improve BLE strength and reduce risk for falls    Baseline 14.34 secs    Time 8    Period --   visits   Status New                 Plan - 09/02/20 [id number]    Clinical Impression Statement Today's skilled session focused on review of HEP from previous episode of care with adjustments and advancements made as needed. No issues noted or reported in session. The pt is progressing toward goals and should benefit from continued PT to progress toward unmet goals.    Personal  Factors and Comorbidities Comorbidity 1;Time since onset of injury/illness/exacerbation    Comorbidities HTN    Examination-Activity Limitations Squat;Stairs;Transfers;Locomotion Level;Lift;Reach Overhead     Examination-Participation Restrictions Community Activity    Stability/Clinical Decision Making Stable/Uncomplicated    Rehab Potential Fair    PT Frequency 1x / week    PT Duration 8 weeks   plus eval   PT Treatment/Interventions ADLs/Self Care Home Management;Cryotherapy;Electrical Stimulation;Moist Heat;DME Instruction;Gait training;Stair training;Functional mobility training;Therapeutic activities;Therapeutic exercise;Balance training;Neuromuscular re-education;Patient/family education;Orthotic Fit/Training;Manual techniques;Passive range of motion    PT Next Visit Plan work on activites to promote improved knee/hip flexion, gait training, strengthening and balance training    Consulted and Agree with Plan of Care Patient           Patient will benefit from skilled therapeutic intervention in order to improve the following deficits and impairments:  Abnormal gait,Decreased balance,Decreased endurance,Difficulty walking,Increased muscle spasms,Impaired sensation,Impaired tone,Improper body mechanics,Pain,Postural dysfunction,Decreased strength,Decreased safety awareness,Decreased knowledge of use of DME,Decreased activity tolerance,Decreased coordination,Decreased range of motion  Visit Diagnosis: Muscle weakness (generalized)  Unsteadiness on feet  Other abnormalities of gait and mobility     Problem List Patient Active Problem List   Diagnosis Date Noted  . Chronic right shoulder pain 06/22/2020  . Adhesive capsulitis of right shoulder 10/07/2019  . Abnormality of gait 09/17/2019  . Spastic hemiparesis of right dominant side ([place]) 07/02/2019  . History of CVA with residual deficit 06/09/2019  . Spastic hemiparesis ([place])   . Uncontrolled type 2 diabetes mellitus with hyperglycemia ([place])   . Noncompliance   . Benign essential HTN   . Newly diagnosed diabetes ([place])   . Leucocytosis   . Right hemiparesis ([place])   . Basal ganglia stroke ([place]) 03/04/2019  . Stroke ([place])  02/28/2019  . Essential hypertension 02/28/2019  . Tobacco abuse 02/28/2019  . Hyperglycemia 02/28/2019    Willow Ora, PTA, [facility] [address] [place], [place] [address] [phone] 09/02/20, 5:19 PM   Name: Casey Petty MRN: [id number] Date of Birth: [birth year]

## 2020-09-02 NOTE — Patient Instructions (Signed)
Access Code: [email or web address] URL: [email or web address] Date: 09/02/2020 Prepared by: Willow Ora  Exercises Supine Bridge with Resistance Band - 1 x daily - 5 x weekly - 10 reps - 2 sets Hooklying Single Leg Bent Knee Fallouts with Resistance - 1 x daily - 5 x weekly - 2 sets - 10 reps Seated Ankle Plantarflexion with Resistance - 1 x daily - 5 x weekly - 1 sets - 10 reps Seated Hamstring Stretch - 1 x daily - 5 x weekly - 1 sets - 3 reps - 30 hold Sit to/from Stand in Stride position - 1 x daily - 5 x weekly - 1 sets - 10 reps Standing March with Unilateral Counter Support - 1 x daily - 5 x weekly - 1 sets - 10 reps Mini Squat - 1 x daily - 5 x weekly - 1 sets - 10 reps Side Stepping with Counter Support - 1 x daily - 7 x weekly - 10 reps - 3 sets Backward Walking with Counter Support - 1 x daily - 7 x weekly - 10 reps - 3 sets

## 2020-09-02 NOTE — Patient Instructions (Signed)
   WEARING SCHEDULE:  Wear splint during the day. May begin wearing/switching to more at night as you build up your tolerance  PURPOSE:  For better positioning   CARE OF SPLINT:  Keep splint away from heat sources including: stove, radiator or furnace, or a car in sunlight. The splint can melt and will no longer fit you properly  Keep away from pets and children  Clean the splint with rubbing alcohol 1-2 times per day.  * During this time, make sure you also clean your hand/arm as instructed by your therapist and/or perform dressing changes as needed. Then dry hand/arm completely before replacing splint.   PRECAUTIONS/POTENTIAL PROBLEMS: *If you notice or experience increased pain, swelling, numbness, or a lingering reddened area from the splint: Contact your therapist immediately by calling [phone]. You must wear the splint for protection, but we will get you scheduled for adjustments as quickly as possible.  (If only straps or hooks need to be replaced and NO adjustments to the splint need to be made, just call the office ahead and let them know you are coming in)  If you have any medical concerns or signs of infection, please call your doctor immediately

## 2020-09-02 NOTE — Therapy (Signed)
[facility] [address] [place], [place], [address] Phone: [phone]   Fax:  [phone]  Occupational Therapy Treatment  Patient Details  Name: Casey Petty MRN: [id number] Date of Birth: 1961/03/13 Referring Provider (OT): Dr Delice Lesch   Encounter Date: 09/02/2020   OT End of Session - 09/02/20 0942    Visit Number 2    Number of Visits 9    Authorization Type UHC MCD - 27 visits combined PT/OT/Speech    OT Start Time 0848    OT Stop Time 0930    OT Time Calculation (min) 42 min    Activity Tolerance Patient tolerated treatment well    Behavior During Therapy [facility] for tasks assessed/performed;Restless           Past Medical History:  Diagnosis Date  . Hypertension     Past Surgical History:  Procedure Laterality Date  . NO PAST SURGERIES      There were no vitals filed for this visit.   Subjective Assessment - 09/02/20 0917    Patient is accompanied by: Family member   fiance   Pertinent History Lt BG stroke 02/28/19 w/ residual Rt dominant side hemiparesis, adhesive capsulitis of Rt shoulder, **RECENT botox injections on 11/07/19. PMH: HTN    Currently in Pain? No/denies    Pain Onset More than a month ago            Pt reports he lost his old resting hand splint in move and cannot find it. Fabricated and fitted new resting hand splint and issued. Reviewed wear and care.  Also fitted for and issued bean bag splint for Rt elbow and reviewed wear and care.                     OT Education - 09/02/20 0920    Education Details splint wear and care    Person(s) Educated Patient;Spouse    Methods Explanation;Handout    Comprehension Verbalized understanding            OT Short Term Goals - 09/02/20 0944      OT SHORT TERM GOAL #1   Title Independent with HEP for RUE    Baseline Has prior program, needs updated as patient moves quickly and is developing pain    Time 4     Period Weeks    Status New    Target Date 10/03/20      OT SHORT TERM GOAL #2   Title Independent with resting hand splint for pm wear    Baseline dependent, no longer wearing    Time 4    Period Weeks    Status On-going      OT SHORT TERM GOAL #3   Title Decrease pain with active elbow extension by 2 points (10 point scale)    Baseline 5/10    Time 4    Period Weeks    Status New             OT Long Term Goals - 08/19/20 1810      OT LONG TERM GOAL #1   Title Pt to verbalize understanding of AE needs to increase independence and safety with ADLS (including walker splint, shoe buttons, one handed cutting board)    Baseline mod assist    Time 8    Period Weeks    Status New    Target Date 11/02/20      OT LONG TERM GOAL #2  Title Pt to report shoulder pain less than or equal to 5/10 with P/ROM to 130*    Baseline 115 flex, and 105 abd    Time 8    Period Weeks    Status New      OT LONG TERM GOAL #3   Title Patient will tolerate splinting schedule for hand and or elbow orthosis    Baseline inconsistent    Time 8    Period Weeks    Status New      OT LONG TERM GOAL #4   Title Patient will don doff shirt with modified independence    Baseline min assist    Time 8    Period Weeks    Status New                 Plan - 09/02/20 0944    Clinical Impression Statement Progressing towards STG #2.    OT Occupational Profile and History Detailed Assessment- Review of Records and additional review of physical, cognitive, psychosocial history related to current functional performance    Occupational performance deficits (Please refer to evaluation for details): ADL's;IADL's;Rest and Sleep;Social Participation    Body Structure / Function / Physical Skills ADL;ROM;Balance;IADL;Improper spinal/pelvic alignment;Body mechanics;Sensation;Mobility;Flexibility;Strength;Coordination;Tone;UE functional use;Pain;Decreased knowledge of use of DME;Proprioception;FMC    Rehab  Potential Good    Clinical Decision Making Several treatment options, min-mod task modification necessary    Comorbidities Affecting Occupational Performance: Presence of comorbidities impacting occupational performance    Comorbidities impacting occupational performance description: Adhesive capsulitis Rt shoulder    Modification or Assistance to Complete Evaluation  Min-Moderate modification of tasks or assist with assess necessary to complete eval    OT Frequency 1x / week    OT Duration 8 weeks    OT Treatment/Interventions Self-care/ADL training;Therapeutic exercise;Functional Mobility Training;Aquatic Therapy;Ultrasound;Neuromuscular education;Manual Therapy;Splinting;Therapeutic activities;Coping strategies training;DME and/or AE instruction;Cognitive remediation/compensation;Electrical Stimulation;Moist Heat;Passive range of motion;Patient/family education;Balance training    Plan assess resting hand splint and elbow splint, NMR and manual therapy Rt shoulder    Consulted and Agree with Plan of Care Patient;Family member/caregiver    Family Member Consulted Mont D[place]           Patient will benefit from skilled therapeutic intervention in order to improve the following deficits and impairments:   Body Structure / Function / Physical Skills: ADL,ROM,Balance,IADL,Improper spinal/pelvic alignment,Body mechanics,Sensation,Mobility,Flexibility,Strength,Coordination,Tone,UE functional use,Pain,Decreased knowledge of use of DME,Proprioception,FMC       Visit Diagnosis: Hemiplegia and hemiparesis following cerebral infarction affecting right dominant side (HCC)  Spastic hemiparesis of right dominant side as late effect of cerebral infarction [facility])    Problem List Patient Active Problem List   Diagnosis Date Noted  . Chronic right shoulder pain 06/22/2020  . Adhesive capsulitis of right shoulder 10/07/2019  . Abnormality of gait 09/17/2019  . Spastic hemiparesis of right  dominant side ([place]) 07/02/2019  . History of CVA with residual deficit 06/09/2019  . Spastic hemiparesis ([place])   . Uncontrolled type 2 diabetes mellitus with hyperglycemia ([place])   . Noncompliance   . Benign essential HTN   . Newly diagnosed diabetes ([place])   . Leucocytosis   . Right hemiparesis ([place])   . Basal ganglia stroke ([place]) 03/04/2019  . Stroke ([place]) 02/28/2019  . Essential hypertension 02/28/2019  . Tobacco abuse 02/28/2019  . Hyperglycemia 02/28/2019    Carey Bullocks, OTR/L 09/02/2020, 9:47 AM  [facility] [address] [place], [place], [address] Phone: [phone]   Fax:  [phone]  Name: Casey Petty MRN: [id number] Date of Birth: November 04, 1960

## 2020-09-03 DIAGNOSIS — I639 Cerebral infarction, unspecified: Secondary | ICD-10-CM | POA: Diagnosis not present

## 2020-09-03 LAB — HIV ANTIBODY (ROUTINE TESTING W REFLEX): HIV Screen 4th Generation wRfx: NONREACTIVE

## 2020-09-03 LAB — CMP14+EGFR
ALT: 42 IU/L (ref 0–44)
AST: 24 IU/L (ref 0–40)
Albumin/Globulin Ratio: 1.7 (ref 1.2–2.2)
Albumin: 4.5 g/dL (ref 3.8–4.9)
Alkaline Phosphatase: 101 IU/L (ref 44–121)
BUN/Creatinine Ratio: 11 (ref 9–20)
BUN: 11 mg/dL (ref 6–24)
Bilirubin Total: 0.4 mg/dL (ref 0.0–1.2)
CO2: 23 mmol/L (ref 20–29)
Calcium: 10.3 mg/dL — ABNORMAL HIGH (ref 8.7–10.2)
Chloride: 101 mmol/L (ref 96–106)
Creatinine, Ser: 1.04 mg/dL (ref 0.76–1.27)
GFR calc Af Amer: 92 mL/min/{1.73_m2} (ref >59)
GFR calc non Af Amer: 79 mL/min/{1.73_m2} (ref >59)
Globulin, Total: 2.7 g/dL (ref 1.5–4.5)
Glucose: 389 mg/dL — ABNORMAL HIGH (ref 65–99)
Potassium: 4.4 mmol/L (ref 3.5–5.2)
Sodium: 139 mmol/L (ref 134–144)
Total Protein: 7.2 g/dL (ref 6.0–8.5)

## 2020-09-03 LAB — HCV RNA QUANT RFLX ULTRA OR GENOTYP: HCV Quant Baseline: NOT DETECTED IU/mL

## 2020-09-04 ENCOUNTER — Other Ambulatory Visit: Payer: Self-pay | Admitting: Family Medicine

## 2020-09-04 ENCOUNTER — Telehealth: Payer: Self-pay

## 2020-09-04 DIAGNOSIS — I639 Cerebral infarction, unspecified: Secondary | ICD-10-CM | POA: Diagnosis not present

## 2020-09-04 MED ORDER — DAPAGLIFLOZIN PROPANEDIOL 10 MG PO TABS: 10.0000 mg | ORAL_TABLET | Freq: Every day | ORAL | 1 refills | Status: DC

## 2020-09-04 MED FILL — FARXIGA 10 MG TABLET: 10 | 30 days supply | Qty: 30 | Fill #0

## 2020-09-04 NOTE — Telephone Encounter (Signed)
Done

## 2020-09-04 NOTE — Telephone Encounter (Signed)
Patient name and DOB has been verified Patient was informed of lab results. Patient had no questions.  

## 2020-09-04 NOTE — Telephone Encounter (Signed)
-----   Message from Charlott Rakes, MD sent at 09/04/2020 11:53 AM EST ----- Glucose is elevated and he will need to comply with the Diabetic regimen discussed at his last visit.  Other labs are stable

## 2020-09-04 NOTE — Telephone Encounter (Signed)
Casey Petty is preferred under his ins, Casey Rea PA was denied.  If appropriate can you change to [place]

## 2020-09-05 DIAGNOSIS — I639 Cerebral infarction, unspecified: Secondary | ICD-10-CM | POA: Diagnosis not present

## 2020-09-07 DIAGNOSIS — I639 Cerebral infarction, unspecified: Secondary | ICD-10-CM | POA: Diagnosis not present

## 2020-09-07 MED FILL — ATORVASTATIN CALCIUM 80 MG: 80 | 90 days supply | Qty: 90 | Fill #0

## 2020-09-07 MED FILL — AMLODIPINE BESYLATE 10 MG T: 10 | 90 days supply | Qty: 90 | Fill #0

## 2020-09-08 DIAGNOSIS — I639 Cerebral infarction, unspecified: Secondary | ICD-10-CM | POA: Diagnosis not present

## 2020-09-09 ENCOUNTER — Ambulatory Visit: Payer: Medicaid Other | Attending: Adult Health

## 2020-09-09 ENCOUNTER — Ambulatory Visit: Payer: Medicaid Other | Admitting: Occupational Therapy

## 2020-09-09 ENCOUNTER — Other Ambulatory Visit: Payer: Self-pay

## 2020-09-09 DIAGNOSIS — R2681 Unsteadiness on feet: Secondary | ICD-10-CM | POA: Diagnosis present

## 2020-09-09 DIAGNOSIS — M25511 Pain in right shoulder: Secondary | ICD-10-CM | POA: Insufficient documentation

## 2020-09-09 DIAGNOSIS — M6281 Muscle weakness (generalized): Secondary | ICD-10-CM | POA: Insufficient documentation

## 2020-09-09 DIAGNOSIS — I69351 Hemiplegia and hemiparesis following cerebral infarction affecting right dominant side: Secondary | ICD-10-CM | POA: Diagnosis not present

## 2020-09-09 DIAGNOSIS — G8929 Other chronic pain: Secondary | ICD-10-CM

## 2020-09-09 DIAGNOSIS — R2689 Other abnormalities of gait and mobility: Secondary | ICD-10-CM | POA: Insufficient documentation

## 2020-09-09 DIAGNOSIS — I639 Cerebral infarction, unspecified: Secondary | ICD-10-CM | POA: Diagnosis not present

## 2020-09-09 NOTE — Therapy (Signed)
[facility] [address] [place], [place], [address] Phone: [phone]   Fax:  [phone]  Physical Therapy Treatment  Patient Details  Name: Casey Petty MRN: [id number] Date of Birth: 05-21-1961 Referring Provider (PT): [name], NP   Encounter Date: 09/09/2020   PT End of Session - 09/09/20 1014    Visit Number 3    Number of Visits 9    Date for PT Re-Evaluation --   POC for 8 weeks   Authorization Type UHC Medicaid (VL: 27)    PT Start Time 1013    PT Stop Time 1056    PT Time Calculation (min) 43 min    Equipment Utilized During Treatment Gait belt    Activity Tolerance Patient tolerated treatment well    Behavior During Therapy WFL for tasks assessed/performed;Restless           Past Medical History:  Diagnosis Date  . Hypertension     Past Surgical History:  Procedure Laterality Date  . NO PAST SURGERIES      There were no vitals filed for this visit.   Subjective Assessment - 09/09/20 1013    Subjective No new changes/complaints since last visit. No pain. No falls to report. reports exercises are going well.    Patient is accompained by: Family member    Pertinent History HTN    Limitations Standing;Walking;House hold activities    Currently in Pain? No/denies                 [facility] Adult PT Treatment/Exercise - 09/09/20 0001      Transfers   Transfers Sit to Stand;Stand to Sit    Sit to Stand 5: Supervision    Stand to Sit 5: Supervision    Comments completed sit <> stands with LLE placed on 2" block to promote improved strengthening, completed x 10 reps. tactile cues to avoid adduction.      Ambulation/Gait   Ambulation/Gait Yes    Ambulation/Gait Assistance 5: Supervision    Ambulation/Gait Assistance Details Completed ambulation x 115 ft with SPC with quad tip attachment. Supervision throughout. Due to patient reports of ambulating without SPC at home, completed  ambulation x without AD. PT educating on continued use of SPC for safety with ambulation.    Assistive device Straight cane    Gait Pattern Step-through pattern;Decreased arm swing - right;Decreased step length - right;Decreased step length - left;Decreased stance time - right;Decreased hip/knee flexion - right;Decreased weight shift to right;Poor foot clearance - right    Ambulation Surface Level;Indoor      Neuro Re-ed    Neuro Re-ed Details  In // bars: completed alternating marching x 15 reps bilaterally, intermittent assistance for RLE due to icnreased spasticity. Completed forward steps over black beam x 15 with RLE to promote improved hip/knee flexion and clearance. Min A required at times. With single UE support and 6" step completed toe taps to with bilateral LE. With RLE working on improved hip/knee flexion, with LLE working toward improved stance, completed x 10 reps bilaterally. Continue to require verbal cues for slowed pace with completion.      Exercises   Exercises Knee/Hip    Other Exercises  Completed heel slides with resistance 2 x 5 reps, PT providing min A for maintaining neutral alignment of RLE. Completed mini squats without UE support 2 x 10 reps, PT providing assistance to maintain proper alignment/posture upon standing and improved weight shift onto RLE.  Knee/Hip Exercises: Aerobic   Other Aerobic Completed SciFit on Level 1.5 x 7 minutes for improved recirpocal motion and improved strengthening.                  PT Education - 09/09/20 1059    Education Details Educated on walking into/out of session with [facility]    Person(s) Educated Patient    Methods Explanation    Comprehension Verbalized understanding            PT Short Term Goals - 08/19/20 1207      PT SHORT TERM GOAL #1   Title Patient will be independent with initial HEP for strengthening/balance (All STGS due at 5th visit)    Baseline prior HEP provided    Time 4    Period --   visits    Status New      PT SHORT TERM GOAL #2   Title Patient will improve 5x sit <> stand to <12 seconds without UE support to demonstrate improved balance    Baseline 14.34 secs    Time 4    Period --   visits   Status New      PT SHORT TERM GOAL #3   Title Patient will improve gait speed to >/= 2.3 ft/sec to demosntrate improved community ambulation    Baseline 2.04 ft/sec    Time 4    Period --   visits   Status New             PT Long Term Goals - 08/19/20 1209      PT LONG TERM GOAL #1   Title Patient will be independent with final HEP for maintaining functional gains and improved balance/strength (All LTGs due at 9th visit)    Baseline prior HEP provided    Time 8    Period --   visits   Status New      PT LONG TERM GOAL #2   Title Patient will improve Berg Balance Score from 47/56 to >/= 51/56 to demonstrate improved balance and reduce risk of falls    Baseline 47/56    Time 8    Period --   visits   Status New      PT LONG TERM GOAL #3   Title Patient will improve TUG to </= 10 seconds to demosntrate reduced fall risk    Baseline 13.25 secs    Time 8    Period --   visits   Status New      PT LONG TERM GOAL #4   Title Patient will improve gait speed to >/= 3.0 ft/sec to demonstrate improved community mobility    Baseline 2.04 ft/sec    Time 8    Period --   visits   Status New      PT LONG TERM GOAL #5   Title Patient will improve 5 sit <>stand to </=10 seconds for improve BLE strength and reduce risk for falls    Baseline 14.34 secs    Time 8    Period --   visits   Status New                 Plan - 09/09/20 1014    Clinical Impression Statement Today's skilled PT session focused on continued activities to promote improved hip/knee flexion on RLE. Patient tolerating well. Patient requires frequent cues with activities for slow paced to promote improved form/technique. Will continue to progress toward all LTGs.    Personal  Factors and  Comorbidities Comorbidity 1;Time since onset of injury/illness/exacerbation    Comorbidities HTN    Examination-Activity Limitations Squat;Stairs;Transfers;Locomotion Level;Lift;Reach Overhead    Examination-Participation Restrictions Community Activity    Stability/Clinical Decision Making Stable/Uncomplicated    Rehab Potential Fair    PT Frequency 1x / week    PT Duration 8 weeks   plus eval   PT Treatment/Interventions ADLs/Self Care Home Management;Cryotherapy;Electrical Stimulation;Moist Heat;DME Instruction;Gait training;Stair training;Functional mobility training;Therapeutic activities;Therapeutic exercise;Balance training;Neuromuscular re-education;Patient/family education;Orthotic Fit/Training;Manual techniques;Passive range of motion    PT Next Visit Plan work on activites to promote improved knee/hip flexion, gait training, strengthening and balance training    Consulted and Agree with Plan of Care Patient           Patient will benefit from skilled therapeutic intervention in order to improve the following deficits and impairments:  Abnormal gait,Decreased balance,Decreased endurance,Difficulty walking,Increased muscle spasms,Impaired sensation,Impaired tone,Improper body mechanics,Pain,Postural dysfunction,Decreased strength,Decreased safety awareness,Decreased knowledge of use of DME,Decreased activity tolerance,Decreased coordination,Decreased range of motion  Visit Diagnosis: Hemiplegia and hemiparesis following cerebral infarction affecting right dominant side (HCC)  Muscle weakness (generalized)  Unsteadiness on feet  Other abnormalities of gait and mobility     Problem List Patient Active Problem List   Diagnosis Date Noted  . Chronic right shoulder pain 06/22/2020  . Adhesive capsulitis of right shoulder 10/07/2019  . Abnormality of gait 09/17/2019  . Spastic hemiparesis of right dominant side ([place]) 07/02/2019  . History of CVA with residual deficit  06/09/2019  . Spastic hemiparesis ([place])   . Uncontrolled type 2 diabetes mellitus with hyperglycemia ([place])   . Noncompliance   . Benign essential HTN   . Newly diagnosed diabetes ([place])   . Leucocytosis   . Right hemiparesis ([place])   . Basal ganglia stroke ([place]) 03/04/2019  . Stroke ([place]) 02/28/2019  . Essential hypertension 02/28/2019  . Tobacco abuse 02/28/2019  . Hyperglycemia 02/28/2019    Jones Bales, PT, DPT 09/09/2020, 11:58 AM  [facility] [address] [place], [place], [address] Phone: [phone]   Fax:  [phone]  Name: Casey Petty MRN: [id number] Date of Birth: August 14, 1960

## 2020-09-09 NOTE — Therapy (Signed)
[facility] [address] [place], [place], [address] Phone: [phone]   Fax:  [phone]  Occupational Therapy Treatment  Patient Details  Name: Casey Petty MRN: [id number] Date of Birth: [birth year] Referring Provider (OT): Dr Delice Lesch   Encounter Date: 09/09/2020   OT End of Session - 09/09/20 1213    Visit Number 3    Number of Visits 9    Authorization Type UHC MCD - 27 visits combined PT/OT/Speech    OT Start Time 0930    OT Stop Time 1015    OT Time Calculation (min) 45 min    Activity Tolerance Patient tolerated treatment well    Behavior During Therapy [facility] for tasks assessed/performed;Restless           Past Medical History:  Diagnosis Date  . Hypertension     Past Surgical History:  Procedure Laterality Date  . NO PAST SURGERIES      There were no vitals filed for this visit.   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 The splint and elbow thing is doing fine   Patient is accompanied by: Family member   fiance   Pertinent History Lt BG stroke 02/28/19 w/ residual Rt dominant side hemiparesis, adhesive capsulitis of Rt shoulder, **RECENT botox injections on 08/13/20. PMH: HTN    Currently in Pain? Yes    Pain Score 8     Pain Location Shoulder    Pain Orientation Right    Pain Descriptors / Indicators Aching    Pain Type Chronic pain    Pain Onset More than a month ago    Pain Frequency Intermittent    Aggravating Factors  movement    Pain Relieving Factors rest           Worked on review and proper technique of previously issued HEP (from previous admission) for pain and spasiticity management, and neuro re-educ, as well as P/ROM to maintain joint integrity/motion and to prevent contractures.  Wt bearing over RUE seated while cross reaching LUE to activate triceps. BUE AA/ROM reaching towards floor with ball - pt required mod to max  cueing to prevent compensations into sh abduction and IR when coming back up, therefore needs further review before issuing for HEP.   Pt reports resting hand splint and bean bag splint are working well and he is tolerating all night.                      OT Education - 09/09/20 1212    Education Details Re issued and reviewed proper technique and slowing down movement for previously issued HEP    Person(s) Educated Patient;Spouse    Methods Explanation;Handout;Demonstration;Verbal cues    Comprehension Verbalized understanding;Returned demonstration;Verbal cues required            OT Short Term Goals - 09/09/20 1213      OT SHORT TERM GOAL #1   Title Independent with HEP for RUE    Baseline Has prior program, needs updated as patient moves quickly and is developing pain    Time 4    Period Weeks    Status On-going    Target Date 10/03/20      OT SHORT TERM GOAL #2   Title Independent with resting hand splint for pm wear    Baseline dependent, no longer wearing    Time 4    Period Weeks  Status Achieved      OT SHORT TERM GOAL #3   Title Decrease pain with active elbow extension by 2 points (10 point scale)    Baseline 5/10    Time 4    Period Weeks    Status On-going             OT Long Term Goals - 09/09/20 1214      OT LONG TERM GOAL #1   Title Pt to verbalize understanding of AE needs to increase independence and safety with ADLS (including walker splint, shoe buttons, one handed cutting board)    Baseline mod assist    Time 8    Period Weeks    Status New      OT LONG TERM GOAL #2   Title Pt to report shoulder pain less than or equal to 5/10 with P/ROM to 130*    Baseline 115 flex, and 105 abd    Time 8    Period Weeks    Status New      OT LONG TERM GOAL #3   Title Patient will tolerate splinting schedule for hand and or elbow orthosis    Baseline inconsistent    Time 8    Period Weeks    Status Achieved      OT LONG TERM GOAL  #4   Title Patient will don doff shirt with modified independence    Baseline min assist    Time 8    Period Weeks    Status New                 Plan - 09/09/20 1214    Clinical Impression Statement Pt has met STG #2 and LTG #3. Pt approximating STG #1.    OT Occupational Profile and History Detailed Assessment- Review of Records and additional review of physical, cognitive, psychosocial history related to current functional performance    Occupational performance deficits (Please refer to evaluation for details): ADL's;IADL's;Rest and Sleep;Social Participation    Body Structure / Function / Physical Skills ADL;ROM;Balance;IADL;Improper spinal/pelvic alignment;Body mechanics;Sensation;Mobility;Flexibility;Strength;Coordination;Tone;UE functional use;Pain;Decreased knowledge of use of DME;Proprioception;FMC    Rehab Potential Good    Clinical Decision Making Several treatment options, min-mod task modification necessary    Comorbidities Affecting Occupational Performance: Presence of comorbidities impacting occupational performance    Comorbidities impacting occupational performance description: Adhesive capsulitis Rt shoulder    Modification or Assistance to Complete Evaluation  Min-Moderate modification of tasks or assist with assess necessary to complete eval    OT Frequency 1x / week    OT Duration 8 weeks    OT Treatment/Interventions Self-care/ADL training;Therapeutic exercise;Functional Mobility Training;Aquatic Therapy;Ultrasound;Neuromuscular education;Manual Therapy;Splinting;Therapeutic activities;Coping strategies training;DME and/or AE instruction;Cognitive remediation/compensation;Electrical Stimulation;Moist Heat;Passive range of motion;Patient/family education;Balance training    Plan Add weight bearing ex (seated) and possibly reaching to floor with ball ex to HEP, work on LTG #1 and #4, consider transitioning to aquatic therapy if available.    Consulted and Agree with  Plan of Care Patient;Family member/caregiver    Family Member Consulted [place]           Patient will benefit from skilled therapeutic intervention in order to improve the following deficits and impairments:   Body Structure / Function / Physical Skills: ADL,ROM,Balance,IADL,Improper spinal/pelvic alignment,Body mechanics,Sensation,Mobility,Flexibility,Strength,Coordination,Tone,UE functional use,Pain,Decreased knowledge of use of DME,Proprioception,FMC       Visit Diagnosis: Hemiplegia and hemiparesis following cerebral infarction affecting right dominant side (HCC)  Spastic hemiparesis of right dominant side as late effect of  cerebral infarction [facility]  Chronic right shoulder pain    Problem List Patient Active Problem List   Diagnosis Date Noted  . Chronic right shoulder pain 06/22/2020  . Adhesive capsulitis of right shoulder 10/07/2019  . Abnormality of gait 09/17/2019  . Spastic hemiparesis of right dominant side ([place]) 07/02/2019  . History of CVA with residual deficit 06/09/2019  . Spastic hemiparesis ([place])   . Uncontrolled type 2 diabetes mellitus with hyperglycemia ([place])   . Noncompliance   . Benign essential HTN   . Newly diagnosed diabetes ([place])   . Leucocytosis   . Right hemiparesis ([place])   . Basal ganglia stroke ([place]) 03/04/2019  . Stroke ([place]) 02/28/2019  . Essential hypertension 02/28/2019  . Tobacco abuse 02/28/2019  . Hyperglycemia 02/28/2019    Carey Bullocks, OTR/L 09/09/2020, 12:16 PM  [facility] [address] [place], [place], [address] Phone: [phone]   Fax:  [phone]  Name: Casey Petty MRN: [id number] Date of Birth: 04-07-1961

## 2020-09-10 DIAGNOSIS — I639 Cerebral infarction, unspecified: Secondary | ICD-10-CM | POA: Diagnosis not present

## 2020-09-11 DIAGNOSIS — I639 Cerebral infarction, unspecified: Secondary | ICD-10-CM | POA: Diagnosis not present

## 2020-09-11 MED FILL — FARXIGA 10 MG TABLET: 10 | 30 days supply | Qty: 30 | Fill #0

## 2020-09-14 DIAGNOSIS — I639 Cerebral infarction, unspecified: Secondary | ICD-10-CM | POA: Diagnosis not present

## 2020-09-15 DIAGNOSIS — I639 Cerebral infarction, unspecified: Secondary | ICD-10-CM | POA: Diagnosis not present

## 2020-09-15 MED FILL — FARXIGA 10 MG TABLET: 10 | 30 days supply | Qty: 30 | Fill #0

## 2020-09-15 MED FILL — AMLODIPINE BESYLATE 10 MG T: 10 | 90 days supply | Qty: 90 | Fill #0

## 2020-09-15 MED FILL — ATORVASTATIN CALCIUM 80 MG: 80 | 90 days supply | Qty: 90 | Fill #0

## 2020-09-16 ENCOUNTER — Ambulatory Visit: Payer: Medicaid Other

## 2020-09-16 ENCOUNTER — Ambulatory Visit: Payer: Medicaid Other | Admitting: Occupational Therapy

## 2020-09-16 ENCOUNTER — Other Ambulatory Visit: Payer: Self-pay

## 2020-09-16 DIAGNOSIS — R2689 Other abnormalities of gait and mobility: Secondary | ICD-10-CM

## 2020-09-16 DIAGNOSIS — I69351 Hemiplegia and hemiparesis following cerebral infarction affecting right dominant side: Secondary | ICD-10-CM

## 2020-09-16 DIAGNOSIS — I639 Cerebral infarction, unspecified: Secondary | ICD-10-CM | POA: Diagnosis not present

## 2020-09-16 DIAGNOSIS — G8929 Other chronic pain: Secondary | ICD-10-CM

## 2020-09-16 DIAGNOSIS — R2681 Unsteadiness on feet: Secondary | ICD-10-CM

## 2020-09-16 DIAGNOSIS — M6281 Muscle weakness (generalized): Secondary | ICD-10-CM

## 2020-09-16 NOTE — Therapy (Signed)
[facility] [address] [place], [place], [address] Phone: [phone]   Fax:  [phone]  Physical Therapy Treatment  Patient Details  Name: Casey Petty MRN: [id number] Date of Birth: Jul 07, 1961 Referring Provider (PT): [name], NP   Encounter Date: 09/16/2020   PT End of Session - 09/16/20 1008    Visit Number 4    Number of Visits 9    Date for PT Re-Evaluation --   POC for 8 weeks   Authorization Type U[employer] Medicaid (VL: 27)    PT Start Time 1011    PT Stop Time 1056    PT Time Calculation (min) 45 min    Equipment Utilized During Treatment Gait belt    Activity Tolerance Patient tolerated treatment well    Behavior During Therapy [facility] for tasks assessed/performed;Restless           Past Medical History:  Diagnosis Date  . Hypertension     Past Surgical History:  Procedure Laterality Date  . NO PAST SURGERIES      There were no vitals filed for this visit.   Subjective Assessment - 09/16/20 1014    Subjective Patient reports no new changes. Walked in with [facility] today. Exercises are going well. No falls.    Patient is accompained by: Family member    Pertinent History HTN    Limitations Standing;Walking;House hold activities    Currently in Pain? No/denies             [facility] Adult PT Treatment/Exercise - 09/16/20 0001      Transfers   Transfers Sit to Stand;Stand to Sit    Sit to Stand 5: Supervision    Five time sit to stand comments  10.87 secs without UE support.    Stand to Sit 5: Supervision      Ambulation/Gait   Ambulation/Gait Yes    Ambulation/Gait Assistance 5: Supervision;4: Min guard    Ambulation/Gait Assistance Details PT educating on need to drop shoe off at [place] for toe cap to be applied, however patient other set of tennis shoes does not fit AFO. Compelted ambulation x 115 ft without AFO donned, patient able to complte ambulation with supervision but decreased toe  clearance noted. Patient will try to see if brace will fit in other shoes. Completed ambulation x 400 ft with AFO donned and no AD with CGA from PT. Verbal cues for step length.    Ambulation Distance (Feet) 115 Feet   x 1, 400 x1   Assistive device Straight cane   with quad tip attachment   Gait Pattern Step-through pattern;Decreased arm swing - right;Decreased step length - right;Decreased step length - left;Decreased stance time - right;Decreased hip/knee flexion - right;Decreased weight shift to right;Poor foot clearance - right    Ambulation Surface Level;Indoor    Gait velocity 14.25 secs = 2.30 ft/sec      Exercises   Exercises Knee/Hip    Other Exercises  Completed heel slides with green theraband resistance 2 x 10 reps, PT providing CGA for maintaining neutral alignment of RLE. With patient supine completed alternating marching, PT Providing manual resistance for imrpoved hip strengthing of RLE, completed 2 x 10 reps. Completed bridges with 5 second hold x 10 reps, verbal cues for slowed paced and control. In hooklying position: completed isometric hip adduction with ball squeexe x 10 reps with 3 second hold, verbal cues for technique.      Knee/Hip Exercises: Aerobic   Other Aerobic Completed  SciFit on Level 1.5 x 5 minutes for improved recirpocal motion and improved strengthening. Utilzied theraband around thighs to help maintain neutral alignment with completion.             PT Education - 09/16/20 1010    Education Details Educated on option to get new toe cap from [employer]) Educated Patient;Spouse    Methods Explanation    Comprehension Verbalized understanding            PT Short Term Goals - 09/16/20 1049      PT SHORT TERM GOAL #1   Title Patient will be independent with initial HEP for strengthening/balance (All STGS due at 5th visit)    Baseline reports independence    Time 4    Period --   visits   Status Achieved      PT SHORT TERM GOAL #2    Title Patient will improve 5x sit <> stand to <12 seconds without UE support to demonstrate improved balance    Baseline 10.87 secs    Time 4    Period --   visits   Status Achieved      PT SHORT TERM GOAL #3   Title Patient will improve gait speed to >/= 2.3 ft/sec to demosntrate improved community ambulation    Baseline 2.04 ft/sec; 2.30 ft/sec    Time 4    Period --   visits   Status Achieved             PT Long Term Goals - 08/19/20 1209      PT LONG TERM GOAL #1   Title Patient will be independent with final HEP for maintaining functional gains and improved balance/strength (All LTGs due at 9th visit)    Baseline prior HEP provided    Time 8    Period --   visits   Status New      PT LONG TERM GOAL #2   Title Patient will improve Berg Balance Score from 47/56 to >/= 51/56 to demonstrate improved balance and reduce risk of falls    Baseline 47/56    Time 8    Period --   visits   Status New      PT LONG TERM GOAL #3   Title Patient will improve TUG to </= 10 seconds to demosntrate reduced fall risk    Baseline 13.25 secs    Time 8    Period --   visits   Status New      PT LONG TERM GOAL #4   Title Patient will improve gait speed to >/= 3.0 ft/sec to demonstrate improved community mobility    Baseline 2.04 ft/sec    Time 8    Period --   visits   Status New      PT LONG TERM GOAL #5   Title Patient will improve 5 sit <>stand to </=10 seconds for improve BLE strength and reduce risk for falls    Baseline 14.34 secs    Time 8    Period --   visits   Status New                 Plan - 09/16/20 1011    Clinical Impression Statement Today's skilled PT session focused on continued gait training without AD to further promote improved mobility and balance with ambulation. Assessed STG's today with patient able to meet all STG today during session demonstrating improved gait speed and reduced fall  risk. Continued BLE strengthening activities as tolerated,  intermittent rest breaks required and tactile/verbal cues for form required. Will continue to progress toward all LTGs.    Personal Factors and Comorbidities Comorbidity 1;Time since onset of injury/illness/exacerbation    Comorbidities HTN    Examination-Activity Limitations Squat;Stairs;Transfers;Locomotion Level;Lift;Reach Overhead    Examination-Participation Restrictions Community Activity    Stability/Clinical Decision Making Stable/Uncomplicated    Rehab Potential Fair    PT Frequency 1x / week    PT Duration 8 weeks   plus eval   PT Treatment/Interventions ADLs/Self Care Home Management;Cryotherapy;Electrical Stimulation;Moist Heat;DME Instruction;Gait training;Stair training;Functional mobility training;Therapeutic activities;Therapeutic exercise;Balance training;Neuromuscular re-education;Patient/family education;Orthotic Fit/Training;Manual techniques;Passive range of motion    PT Next Visit Plan work on activites to promote improved knee/hip flexion, gait training, strengthening and balance training    Consulted and Agree with Plan of Care Patient           Patient will benefit from skilled therapeutic intervention in order to improve the following deficits and impairments:  Abnormal gait,Decreased balance,Decreased endurance,Difficulty walking,Increased muscle spasms,Impaired sensation,Impaired tone,Improper body mechanics,Pain,Postural dysfunction,Decreased strength,Decreased safety awareness,Decreased knowledge of use of DME,Decreased activity tolerance,Decreased coordination,Decreased range of motion  Visit Diagnosis: Muscle weakness (generalized)  Unsteadiness on feet  Other abnormalities of gait and mobility     Problem List Patient Active Problem List   Diagnosis Date Noted  . Chronic right shoulder pain 06/22/2020  . Adhesive capsulitis of right shoulder 10/07/2019  . Abnormality of gait 09/17/2019  . Spastic hemiparesis of right dominant side ([place]) 07/02/2019   . History of CVA with residual deficit 06/09/2019  . Spastic hemiparesis ([place])   . Uncontrolled type 2 diabetes mellitus with hyperglycemia ([place])   . Noncompliance   . Benign essential HTN   . Newly diagnosed diabetes ([place])   . Leucocytosis   . Right hemiparesis ([place])   . Basal ganglia stroke ([place]) 03/04/2019  . Stroke ([place]) 02/28/2019  . Essential hypertension 02/28/2019  . Tobacco abuse 02/28/2019  . Hyperglycemia 02/28/2019    Jones Bales, PT, DPT 09/16/2020, 10:57 AM  [facility] [address] [place], [place], [address] Phone: [phone]   Fax:  [phone]  Name: Kendyn Zaman MRN: [id number] Date of Birth: 10-23-1960

## 2020-09-16 NOTE — Therapy (Signed)
[facility] [address] [place], [place], [address] Phone: [phone]   Fax:  [phone]  Occupational Therapy Treatment  Patient Details  Name: Casey Petty MRN: [id number] Date of Birth: [birth year] Referring Provider (OT): Dr Delice Lesch   Encounter Date: 09/16/2020   OT End of Session - 09/16/20 1249    Visit Number 4    Number of Visits 9    Authorization Type UHC MCD - 27 visits combined PT/OT/Speech    OT Start Time 0915    OT Stop Time 1010    OT Time Calculation (min) 55 min    Activity Tolerance Patient tolerated treatment well    Behavior During Therapy [facility] for tasks assessed/performed;Restless           Past Medical History:  Diagnosis Date  . Hypertension     Past Surgical History:  Procedure Laterality Date  . NO PAST SURGERIES      There were no vitals filed for this visit.   Subjective Assessment - 09/16/20 0920    Subjective  I like these ex's    Patient is accompanied by: Family member   fiance   Pertinent History Lt BG stroke 02/28/19 w/ residual Rt dominant side hemiparesis, adhesive capsulitis of Rt shoulder, **RECENT botox injections on 08/13/20. PMH: HTN    Currently in Pain? No/denies    Pain Onset More than a month ago           Pt issued additional ex's that he can safely do at home including holding pool noodle and stretching to floor then back to lap; followed by AA/ROM along horizontal surface (using transfer board) holding pool noodle (BUE's) - these were videotaped for better carryover at home. Pt also shown wt bearing ex seated over RUE while cross reaching and ipsilateral reaching LUE w/ trunk rotation for body on arm movements - caregiver will need to hold wrist/hand down to keep from sliding.  Discussed A/E needs - pt/wife shown shoe buttons and therapist demo use. Pt also practiced use of rocker knife. Pt shown one handed cutting board and one handed kit and provided  handouts for these and told how/where to purchase. Pt does not need walker splint (already has) and now walking w/ cane.                      OT Education - 09/16/20 1250    Education Details A/E recommendations (rocker knife, shoe buttons or velcro converters, one handed cutting board. Already has walker splint), additional HEP (which was videotaped)    Person(s) Educated Patient;Spouse    Methods Explanation;Demonstration;Verbal cues;Handout    Comprehension Verbalized understanding;Returned demonstration;Verbal cues required            OT Short Term Goals - 09/16/20 1251      OT SHORT TERM GOAL #1   Title Independent with HEP for RUE    Baseline Has prior program, needs updated as patient moves quickly and is developing pain    Time 4    Period Weeks    Status Achieved    Target Date 10/03/20      OT SHORT TERM GOAL #2   Title Independent with resting hand splint for pm wear    Baseline dependent, no longer wearing    Time 4    Period Weeks    Status Achieved      OT SHORT TERM GOAL #3   Title Decrease pain with active elbow extension  by 2 points (10 point scale)    Baseline 5/10    Time 4    Period Weeks    Status On-going             OT Long Term Goals - 09/16/20 1250      OT LONG TERM GOAL #1   Title Pt to verbalize understanding of AE needs to increase independence and safety with ADLS (including walker splint, shoe buttons, one handed cutting board)    Baseline mod assist    Time 8    Period Weeks    Status On-going   discussed and issued handouts on 09/16/20 - may need review     OT LONG TERM GOAL #2   Title Pt to report shoulder pain less than or equal to 5/10 with P/ROM to 130*    Baseline 115 flex, and 105 abd    Time 8    Period Weeks    Status Achieved      OT LONG TERM GOAL #3   Title Patient will tolerate splinting schedule for hand and or elbow orthosis    Baseline inconsistent    Time 8    Period Weeks    Status Achieved       OT LONG TERM GOAL #4   Title Patient will don doff shirt with modified independence    Baseline min assist    Time 8    Period Weeks    Status Achieved   per pt/wife report                Plan - 09/16/20 1252    Clinical Impression Statement Pt has met most goals at this time.    OT Occupational Profile and History Detailed Assessment- Review of Records and additional review of physical, cognitive, psychosocial history related to current functional performance    Occupational performance deficits (Please refer to evaluation for details): ADL's;IADL's;Rest and Sleep;Social Participation    Body Structure / Function / Physical Skills ADL;ROM;Balance;IADL;Improper spinal/pelvic alignment;Body mechanics;Sensation;Mobility;Flexibility;Strength;Coordination;Tone;UE functional use;Pain;Decreased knowledge of use of DME;Proprioception;FMC    Rehab Potential Good    Clinical Decision Making Several treatment options, min-mod task modification necessary    Comorbidities Affecting Occupational Performance: Presence of comorbidities impacting occupational performance    Comorbidities impacting occupational performance description: Adhesive capsulitis Rt shoulder    Modification or Assistance to Complete Evaluation  Min-Moderate modification of tasks or assist with assess necessary to complete eval    OT Frequency 1x / week    OT Duration 8 weeks    OT Treatment/Interventions Self-care/ADL training;Therapeutic exercise;Functional Mobility Training;Aquatic Therapy;Ultrasound;Neuromuscular education;Manual Therapy;Splinting;Therapeutic activities;Coping strategies training;DME and/or AE instruction;Cognitive remediation/compensation;Electrical Stimulation;Moist Heat;Passive range of motion;Patient/family education;Balance training    Plan continue to reinforce proper positioning and technique with HEP, continue neuro re-educ, and any reinforcement of A/E prn    Consulted and Agree with Plan of  Care Patient;Family member/caregiver    Family Member Consulted [place]           Patient will benefit from skilled therapeutic intervention in order to improve the following deficits and impairments:   Body Structure / Function / Physical Skills: ADL,ROM,Balance,IADL,Improper spinal/pelvic alignment,Body mechanics,Sensation,Mobility,Flexibility,Strength,Coordination,Tone,UE functional use,Pain,Decreased knowledge of use of DME,Proprioception,FMC       Visit Diagnosis: Hemiplegia and hemiparesis following cerebral infarction affecting right dominant side (HCC)  Spastic hemiparesis of right dominant side as late effect of cerebral infarction [facility])  Chronic right shoulder pain    Problem List Patient Active Problem List   Diagnosis  Date Noted  . Chronic right shoulder pain 06/22/2020  . Adhesive capsulitis of right shoulder 10/07/2019  . Abnormality of gait 09/17/2019  . Spastic hemiparesis of right dominant side ([place]) 07/02/2019  . History of CVA with residual deficit 06/09/2019  . Spastic hemiparesis ([place])   . Uncontrolled type 2 diabetes mellitus with hyperglycemia ([place])   . Noncompliance   . Benign essential HTN   . Newly diagnosed diabetes ([place])   . Leucocytosis   . Right hemiparesis ([place])   . Basal ganglia stroke ([place]) 03/04/2019  . Stroke ([place]) 02/28/2019  . Essential hypertension 02/28/2019  . Tobacco abuse 02/28/2019  . Hyperglycemia 02/28/2019    Carey Bullocks, OTR/L 09/16/2020, 3:23 PM  [facility] [address] [place], [place], [address] Phone: [phone]   Fax:  [phone]  Name: Casey Petty MRN: [id number] Date of Birth: August 21, 1960

## 2020-09-17 DIAGNOSIS — I639 Cerebral infarction, unspecified: Secondary | ICD-10-CM | POA: Diagnosis not present

## 2020-09-19 DIAGNOSIS — I639 Cerebral infarction, unspecified: Secondary | ICD-10-CM | POA: Diagnosis not present

## 2020-09-21 DIAGNOSIS — I639 Cerebral infarction, unspecified: Secondary | ICD-10-CM | POA: Diagnosis not present

## 2020-09-22 ENCOUNTER — Telehealth: Payer: Self-pay | Admitting: Family Medicine

## 2020-09-22 DIAGNOSIS — I639 Cerebral infarction, unspecified: Secondary | ICD-10-CM | POA: Diagnosis not present

## 2020-09-22 MED FILL — FARXIGA 10 MG TABLET: 10 | 30 days supply | Qty: 30 | Fill #0

## 2020-09-22 MED FILL — AMLODIPINE BESYLATE 10 MG T: 10 | 90 days supply | Qty: 90 | Fill #0

## 2020-09-22 MED FILL — ATORVASTATIN CALCIUM 80 MG: 80 | 90 days supply | Qty: 90 | Fill #0

## 2020-09-22 NOTE — Telephone Encounter (Signed)
I attempted to reach Casey Petty today to get him scheduled for a phone visit with the Managed Medicaid team. I left my contact information on his voicemail. I will reach out again in the next 7-14 days if I have not heard back from him.

## 2020-09-23 ENCOUNTER — Encounter: Payer: Self-pay | Admitting: Occupational Therapy

## 2020-09-23 ENCOUNTER — Other Ambulatory Visit: Payer: Self-pay

## 2020-09-23 ENCOUNTER — Ambulatory Visit: Payer: Medicaid Other

## 2020-09-23 ENCOUNTER — Ambulatory Visit: Payer: Medicaid Other | Admitting: Occupational Therapy

## 2020-09-23 DIAGNOSIS — M6281 Muscle weakness (generalized): Secondary | ICD-10-CM

## 2020-09-23 DIAGNOSIS — R2681 Unsteadiness on feet: Secondary | ICD-10-CM

## 2020-09-23 DIAGNOSIS — G8929 Other chronic pain: Secondary | ICD-10-CM

## 2020-09-23 DIAGNOSIS — I639 Cerebral infarction, unspecified: Secondary | ICD-10-CM | POA: Diagnosis not present

## 2020-09-23 DIAGNOSIS — I69351 Hemiplegia and hemiparesis following cerebral infarction affecting right dominant side: Secondary | ICD-10-CM | POA: Diagnosis not present

## 2020-09-23 DIAGNOSIS — R2689 Other abnormalities of gait and mobility: Secondary | ICD-10-CM

## 2020-09-23 NOTE — Therapy (Signed)
[facility] [address] [place], [place], [address] Phone: [phone]   Fax:  [phone]  Physical Therapy Treatment  Patient Details  Name: Casey Petty MRN: [id number] Date of Birth: 1961/05/31 Referring Provider (PT): [name], NP   Encounter Date: 09/23/2020   PT End of Session - 09/23/20 0930    Visit Number 5    Number of Visits 9    Date for PT Re-Evaluation --   POC for 8 weeks   Authorization Type [employer] (VL: 27)    PT Start Time 0930    PT Stop Time 1014    PT Time Calculation (min) 44 min    Equipment Utilized During Treatment Gait belt    Activity Tolerance Patient tolerated treatment well    Behavior During Therapy [facility] for tasks assessed/performed;Restless           Past Medical History:  Diagnosis Date  . Hypertension     Past Surgical History:  Procedure Laterality Date  . NO PAST SURGERIES      There were no vitals filed for this visit.   Subjective Assessment - 09/23/20 0933    Subjective Patient reports no new changes/complaints. Continues to walk into session with SPC. Patient has MD appt tomorrow.    Patient is accompained by: Family member    Pertinent History HTN    Limitations Standing;Walking;House hold activities    Currently in Pain? No/denies               [facility] Adult PT Treatment/Exercise - 09/23/20 0001      Transfers   Transfers Sit to Stand;Stand to Sit    Sit to Stand 5: Supervision    Stand to Sit 5: Supervision    Comments completed sit <> stand with BLE placed on airex to further challenge balance, completed 2 x 10 reps. close supervision      Ambulation/Gait   Ambulation/Gait Yes    Ambulation/Gait Assistance 5: Supervision;4: Min guard    Ambulation/Gait Assistance Details Completed ambulation without AD x 230 ft, PT providing tactile/verbal cues for improved hip/knee flexion.    Ambulation Distance (Feet) 230 Feet    Assistive device  None    Gait Pattern Step-through pattern;Decreased arm swing - right;Decreased step length - right;Decreased step length - left;Decreased stance time - right;Decreased hip/knee flexion - right;Decreased weight shift to right;Poor foot clearance - right    Ambulation Surface Level;Indoor    Gait Comments Completed gait training working on complaint surfaces and working on negotiation over obstacles, completed x 3 laps down and back. Increased challenge with RLE when trailing when negotiating over obstacle. CGA intermittently with completion. Increased challenge with steppiung through on RLE on complaint surface.      Neuro Re-ed    Neuro Re-ed Details  With patient in supine, completed PNF D1 x 2 minutes with PT resistnace into flexion/extension. Followed by 1 minute rest break, then completed additional x 2 mins. Intermittent verbal cues required for patient to breath with activity as patient often wanting to hold breath. Completed standing balance on airex with wide BOS: completed eyes closed x 30 seconds. Then completed eyes closed with horizontal/vertical head turns x 10 reps each direction. incrased challenge with vertical > horizontal.      Exercises   Exercises Other Exercises;Knee/Hip    Other Exercises  Completed bridges x 10 reps with 3 second hold. Progressed to completing bridges with ball squeeze x 10 reps with 5 second hold.  increased challenge with addition of ball squeeze.      Knee/Hip Exercises: Aerobic   Other Aerobic Completed SciFit on Level 2.0 x 8 minutes for improved recirpocal motion and improved strengthening. Utilzied theraband around thighs to help maintain neutral alignment with completion.                    PT Short Term Goals - 09/16/20 1049      PT SHORT TERM GOAL #1   Title Patient will be independent with initial HEP for strengthening/balance (All STGS due at 5th visit)    Baseline reports independence    Time 4    Period --   visits   Status  Achieved      PT SHORT TERM GOAL #2   Title Patient will improve 5x sit <> stand to <12 seconds without UE support to demonstrate improved balance    Baseline 10.87 secs    Time 4    Period --   visits   Status Achieved      PT SHORT TERM GOAL #3   Title Patient will improve gait speed to >/= 2.3 ft/sec to demosntrate improved community ambulation    Baseline 2.04 ft/sec; 2.30 ft/sec    Time 4    Period --   visits   Status Achieved             PT Long Term Goals - 08/19/20 1209      PT LONG TERM GOAL #1   Title Patient will be independent with final HEP for maintaining functional gains and improved balance/strength (All LTGs due at 9th visit)    Baseline prior HEP provided    Time 8    Period --   visits   Status New      PT LONG TERM GOAL #2   Title Patient will improve Berg Balance Score from 47/56 to >/= 51/56 to demonstrate improved balance and reduce risk of falls    Baseline 47/56    Time 8    Period --   visits   Status New      PT LONG TERM GOAL #3   Title Patient will improve TUG to </= 10 seconds to demosntrate reduced fall risk    Baseline 13.25 secs    Time 8    Period --   visits   Status New      PT LONG TERM GOAL #4   Title Patient will improve gait speed to >/= 3.0 ft/sec to demonstrate improved community mobility    Baseline 2.04 ft/sec    Time 8    Period --   visits   Status New      PT LONG TERM GOAL #5   Title Patient will improve 5 sit <>stand to </=10 seconds for improve BLE strength and reduce risk for falls    Baseline 14.34 secs    Time 8    Period --   visits   Status New                 Plan - 09/23/20 0931    Clinical Impression Statement Focused on continued activites to promote improved strengthening and motion of RLE. Paitent tolerating activities well with intermittent rest break. Continue gait training  orking toward reduced circumduction with gait, as well as improved negotiation over obstacles and gait on  complaint surfaces.  WIll continue to progress toward all LTGs.    Personal Factors and Comorbidities Comorbidity 1;Time since onset  of injury/illness/exacerbation    Comorbidities HTN    Examination-Activity Limitations Squat;Stairs;Transfers;Locomotion Level;Lift;Reach Overhead    Examination-Participation Restrictions Community Activity    Stability/Clinical Decision Making Stable/Uncomplicated    Rehab Potential Fair    PT Frequency 1x / week    PT Duration 8 weeks   plus eval   PT Treatment/Interventions ADLs/Self Care Home Management;Cryotherapy;Electrical Stimulation;Moist Heat;DME Instruction;Gait training;Stair training;Functional mobility training;Therapeutic activities;Therapeutic exercise;Balance training;Neuromuscular re-education;Patient/family education;Orthotic Fit/Training;Manual techniques;Passive range of motion    PT Next Visit Plan work on activites to promote improved knee/hip flexion, gait training, strengthening and balance training. work on Secondary school teacher.    Consulted and Agree with Plan of Care Patient           Patient will benefit from skilled therapeutic intervention in order to improve the following deficits and impairments:  Abnormal gait,Decreased balance,Decreased endurance,Difficulty walking,Increased muscle spasms,Impaired sensation,Impaired tone,Improper body mechanics,Pain,Postural dysfunction,Decreased strength,Decreased safety awareness,Decreased knowledge of use of DME,Decreased activity tolerance,Decreased coordination,Decreased range of motion  Visit Diagnosis: Unsteadiness on feet  Other abnormalities of gait and mobility  Muscle weakness (generalized)     Problem List Patient Active Problem List   Diagnosis Date Noted  . Chronic right shoulder pain 06/22/2020  . Adhesive capsulitis of right shoulder 10/07/2019  . Abnormality of gait 09/17/2019  . Spastic hemiparesis of right dominant side ([place]) 07/02/2019  . History of CVA with  residual deficit 06/09/2019  . Spastic hemiparesis ([place])   . Uncontrolled type 2 diabetes mellitus with hyperglycemia ([place])   . Noncompliance   . Benign essential HTN   . Newly diagnosed diabetes ([place])   . Leucocytosis   . Right hemiparesis ([place])   . Basal ganglia stroke ([place]) 03/04/2019  . Stroke ([place]) 02/28/2019  . Essential hypertension 02/28/2019  . Tobacco abuse 02/28/2019  . Hyperglycemia 02/28/2019    Jones Bales, PT, DPT 09/23/2020, 11:51 AM  [facility] [address] [place], [place], [address] Phone: [phone]   Fax:  [phone]  Name: Casey Petty MRN: [id number] Date of Birth: August 15, 1960

## 2020-09-23 NOTE — Therapy (Signed)
[facility] [address] [place], [place], [address] Phone: [phone]   Fax:  [phone]  Occupational Therapy Treatment  Patient Details  Name: Casey Petty MRN: [id number] Date of Birth: 04/05/1961 Referring Provider (OT): Dr Delice Lesch   Encounter Date: 09/23/2020   OT End of Session - 09/23/20 1215    Visit Number 5    Number of Visits [facility] [facility] - 27 visits combined PT/OT/Speech    Authorization - Visit Number 5    Authorization - Number of Visits 9    OT Start Time 4818    OT Stop Time 1100    OT Time Calculation (min) 39 min           Past Medical History:  Diagnosis Date  . Hypertension     Past Surgical History:  Procedure Laterality Date  . NO PAST SURGERIES      There were no vitals filed for this visit.   Subjective Assessment - 09/23/20 1021    Subjective  Denies pain    Pertinent History Lt BG stroke 02/28/19 w/ residual Rt dominant side hemiparesis, adhesive capsulitis of Rt shoulder, **RECENT botox injections on 08/13/20. PMH: HTN    Currently in Pain? No/denies                    Treatment: Reveiwed HEP, min v.c Low range AA/ROM shoulder flexion, abduction and circumduction with UE ranger, min facilitation/ v.c Therapist showed pt how he can perform gentle AA/ROM shoulder flexion with his cane, min v.c Horizontal abduction/ adduction with hand on horizontal cane with washcloth, min v.c Self P/ROM supination/ pronation and wrist flexion/ extension.             OT Education - 09/23/20 1209    Education Details Reviewed HEP issued last visit, min v.c    Person(s) Educated Patient;Spouse    Methods Explanation;Demonstration;Verbal cues;Handout    Comprehension Verbalized understanding;Returned demonstration;Verbal cues required            OT Short Term Goals - 09/16/20 1251      OT SHORT TERM GOAL #1   Title Independent with HEP for  RUE    Baseline Has prior program, needs updated as patient moves quickly and is developing pain    Time 4    Period Weeks    Status Achieved    Target Date 10/03/20      OT SHORT TERM GOAL #2   Title Independent with resting hand splint for pm wear    Baseline dependent, no longer wearing    Time 4    Period Weeks    Status Achieved      OT SHORT TERM GOAL #3   Title Decrease pain with active elbow extension by 2 points (10 point scale)    Baseline 5/10    Time 4    Period Weeks    Status On-going             OT Long Term Goals - 09/16/20 1250      OT LONG TERM GOAL #1   Title Pt to verbalize understanding of AE needs to increase independence and safety with ADLS (including walker splint, shoe buttons, one handed cutting board)    Baseline mod assist    Time 8    Period Weeks    Status On-going   discussed and issued handouts on 09/16/20 - may need review     OT  LONG TERM GOAL #2   Title Pt to report shoulder pain less than or equal to 5/10 with P/ROM to 130*    Baseline 115 flex, and 105 abd    Time 8    Period Weeks    Status Achieved      OT LONG TERM GOAL #3   Title Patient will tolerate splinting schedule for hand and or elbow orthosis    Baseline inconsistent    Time 8    Period Weeks    Status Achieved      OT LONG TERM GOAL #4   Title Patient will don doff shirt with modified independence    Baseline min assist    Time 8    Period Weeks    Status Achieved   per pt/wife report                Plan - 09/23/20 1208    Clinical Impression Statement Pt is progressing towards goals.    OT Occupational Profile and History Detailed Assessment- Review of Records and additional review of physical, cognitive, psychosocial history related to current functional performance    Occupational performance deficits (Please refer to evaluation for details): ADL's;IADL's;Rest and Sleep;Social Participation    Body Structure / Function / Physical Skills  ADL;ROM;Balance;IADL;Improper spinal/pelvic alignment;Body mechanics;Sensation;Mobility;Flexibility;Strength;Coordination;Tone;UE functional use;Pain;Decreased knowledge of use of DME;Proprioception;FMC    Rehab Potential Good    Clinical Decision Making Several treatment options, min-mod task modification necessary    OT Frequency 1x / week    OT Duration 8 weeks    OT Treatment/Interventions Self-care/ADL training;Therapeutic exercise;Functional Mobility Training;Aquatic Therapy;Ultrasound;Neuromuscular education;Manual Therapy;Splinting;Therapeutic activities;Coping strategies training;DME and/or AE instruction;Cognitive remediation/compensation;Electrical Stimulation;Moist Heat;Passive range of motion;Patient/family education;Balance training    Plan continue to reinforce proper positioning and technique with HEP, continue neuro re-educ, and any reinforcement of A/E prn    Consulted and Agree with Plan of Care Patient;Family member/caregiver    Family Member Consulted [place]           Patient will benefit from skilled therapeutic intervention in order to improve the following deficits and impairments:   Body Structure / Function / Physical Skills: ADL,ROM,Balance,IADL,Improper spinal/pelvic alignment,Body mechanics,Sensation,Mobility,Flexibility,Strength,Coordination,Tone,UE functional use,Pain,Decreased knowledge of use of DME,Proprioception,FMC       Visit Diagnosis: Unsteadiness on feet  Other abnormalities of gait and mobility  Muscle weakness (generalized)  Hemiplegia and hemiparesis following cerebral infarction affecting right dominant side (HCC)  Spastic hemiparesis of right dominant side as late effect of cerebral infarction [facility])  Chronic right shoulder pain    Problem List Patient Active Problem List   Diagnosis Date Noted  . Chronic right shoulder pain 06/22/2020  . Adhesive capsulitis of right shoulder 10/07/2019  . Abnormality of gait 09/17/2019  .  Spastic hemiparesis of right dominant side ([place]) 07/02/2019  . History of CVA with residual deficit 06/09/2019  . Spastic hemiparesis ([place])   . Uncontrolled type 2 diabetes mellitus with hyperglycemia ([place])   . Noncompliance   . Benign essential HTN   . Newly diagnosed diabetes ([place])   . Leucocytosis   . Right hemiparesis ([place])   . 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 Stroke ([place]) 02/28/2019  . Essential hypertension 02/28/2019  . Tobacco abuse 02/28/2019  . Hyperglycemia 02/28/2019    Casey Petty 09/23/2020, 12:18 PM  [facility] [address] [place], [place], [address] Phone: [phone]   Fax:  [phone]  Name: Casey Petty MRN: [id number] Date of Birth: 1961-02-08

## 2020-09-24 ENCOUNTER — Encounter: Payer: Medicaid Other | Attending: Registered Nurse | Admitting: Physical Medicine & Rehabilitation

## 2020-09-24 ENCOUNTER — Encounter: Payer: Self-pay | Admitting: Physical Medicine & Rehabilitation

## 2020-09-24 VITALS — BP 133/81 | HR 81 | Temp 97.9°F | Ht 67.0 in | Wt 160.2 lb

## 2020-09-24 DIAGNOSIS — I639 Cerebral infarction, unspecified: Secondary | ICD-10-CM | POA: Diagnosis not present

## 2020-09-24 DIAGNOSIS — G8111 Spastic hemiplegia affecting right dominant side: Secondary | ICD-10-CM | POA: Insufficient documentation

## 2020-09-24 DIAGNOSIS — R269 Unspecified abnormalities of gait and mobility: Secondary | ICD-10-CM | POA: Diagnosis not present

## 2020-09-24 DIAGNOSIS — M7501 Adhesive capsulitis of right shoulder: Secondary | ICD-10-CM | POA: Diagnosis not present

## 2020-09-24 DIAGNOSIS — Z720 Tobacco use: Secondary | ICD-10-CM | POA: Diagnosis not present

## 2020-09-24 DIAGNOSIS — G8110 Spastic hemiplegia affecting unspecified side: Secondary | ICD-10-CM | POA: Insufficient documentation

## 2020-09-24 NOTE — Progress Notes (Addendum)
Subjective:    Patient ID: Casey Petty, male    DOB: July 04, 1961, 60 y.o.   MRN: [id number]  HPI Male with history of HTN--no medicine 3 years presents for follow up for left basal ganglia stroke.  Last clinic visit on 08/13/2020. He had Dysport injection at that time.  Prior to that he had an intraarticular shoulder injection.  Since that time, pt states he does not notice a difference in his muscle tone. He is doing HEP.  He is taking Baclofen as prescribed. BP is controlled. Continues to smoke.  Denies falls. Increase in tone with wrist flexion is main complaint today. Ambulation improving.  Pain Inventory Average Pain 7 Pain Right Now 9 My pain is intermittent, sharp, stabbing and in the right shoulder. Right side of body shakes at night. My right leg shakes and hurts at night.   In the last 24 hours, has pain interfered with the following? General activity 6 Relation with others 0 Enjoyment of life 4 What TIME of day is your pain at its worst? morning, daytime, evening, night, anytime.  Sleep (in general) Poor  Pain is worse with: some activites and When I shake at night. Pain improves with: rest Relief from Meds: no pain meds  Family History  Problem Relation Age of Onset  . Heart attack Father   . Cancer Neg Hx    Social History   Socioeconomic History  . Marital status: Single    Spouse name: Not on file  . Number of children: Not on file  . Years of education: Not on file  . Highest education level: Not on file  Occupational History  . Not on file  Tobacco Use  . Smoking status: Current Every Day Smoker  . Smokeless tobacco: Never Used  . Tobacco comment: Smoking 4 cigs/day  Vaping Use  . Vaping Use: Never used  Substance and Sexual Activity  . Alcohol use: Yes    Alcohol/week: 2.0 standard drinks    Types: 2 Standard drinks or equivalent per week  . Drug use: No  . Sexual activity: Never  Other Topics Concern  . Not on file  Social History Narrative   . Not on file   Social Determinants of Health   Financial Resource Strain: Not on file  Food Insecurity: Not on file  Transportation Needs: Not on file  Physical Activity: Not on file  Stress: Not on file  Social Connections: Not on file   Past Surgical History:  Procedure Laterality Date  . NO PAST SURGERIES     Past Medical History:  Diagnosis Date  . Hypertension    BP 133/81   Pulse 81   Temp 97.9 F (36.6 C)   Wt 160 lb 3.2 oz (72.7 kg)   SpO2 97%   BMI 25.09 kg/m   Opioid Risk Score:   Fall Risk Score:  `1  Depression screen PHQ 2/9  Depression screen [facility] 2/9 09/24/2020 09/01/2020 07/16/2020 06/22/2020 02/06/2020 12/19/2019 06/20/2019  Decreased Interest 0 1 0 0 0 0 0  Down, Depressed, Hopeless 0 0 0 0 0 0 0  PHQ - 2 Score 0 1 0 0 0 0 0  Altered sleeping - 0 - - - - -  Tired, decreased energy - 0 - - - - -  Change in appetite - 0 - - - - -  Feeling bad or failure about yourself  - 0 - - - - -  Trouble concentrating - 0 - - - - -  Moving slowly or fidgety/restless - 0 - - - - -  Suicidal thoughts - 0 - - - - -  PHQ-9 Score - 1 - - - - -   Review of Systems  Constitutional: Negative.   HENT: Negative.   Eyes: Negative.   Respiratory: Negative.   Cardiovascular: Negative.   Gastrointestinal: Negative.   Endocrine: Negative.   Genitourinary: Negative.   Musculoskeletal: Positive for arthralgias and gait problem.       Spasms/ shoulder pain on stroke affected side. Also right leg has pain & shakes at night.   Skin: Negative.   Allergic/Immunologic: Negative.   Neurological: Positive for tremors and weakness.  Hematological: Negative.   Psychiatric/Behavioral: Negative.       Objective:   Physical Exam  Constitutional: No distress . Vital signs reviewed. HENT: Normocephalic.  Atraumatic. Eyes: EOMI. No discharge. Cardiovascular: No JVD.   Respiratory: Normal effort.  No stridor.   GI: Non-distended.   Skin: Warm and dry.  Intact. Psych: Normal  mood.  Normal behavior. Musc:  Right shoulder with limitation in ER, shoulder abduction improved. Gait: Hemiparetic Neurologic: Alert Dysarthria, stable Motor:  RUE: Shoulder abduction 3/5 (pain inhibition), elbow flexion 2+/5, elbow extension 4-/5, handgrip 2/5 with apraxia Right lower extremity: Hip flexion, knee extension 4/5, ankle dorsiflexion 0/5 MAS: right shoulder abdcutors 1/4, elbow flexors 1+/4, wrist flexors 1+/4, finger flexors 1/4, ankle dorsiflexors 0/4    Assessment & Plan:  Male with history of HTN--no medicine 3 years presents for follow up for left basal ganglia stroke.  1. Right hemiparesis, now with spasticity and functional deficitssecondary to left subcortical lacunar infarct.             Continue therapies             Continue Baclofen 30 TID             Follow up with Neurology, needs appointment, reminded again x6 - information provided             Continue dysport:                         Right    Mec Major:      50 units                                                                          Biceps:            150    Brachioradialis 100                                     FCR                 250, increase to 350                                     FCU                 250  FDS                 200                                     FDP                 100                          Right    Gastroc Med   50                                     Lastroc Lat      50   2. Gait abnormality             Cont therapies             Continue ambulation with AFO, encouraged new foot ware given plantar shoe breakdown  3. Tobacco abuse             Now 1/day, encouraged abstinence x8  4. Adhesive capsulitis             Some benefit with with intraarticular injection on 3/1, again limited benefit on 12/9, will schedule again             Continue Voltaren gel             Xray of shoulder, reviewed, unremarkable

## 2020-09-25 DIAGNOSIS — I639 Cerebral infarction, unspecified: Secondary | ICD-10-CM | POA: Diagnosis not present

## 2020-09-26 DIAGNOSIS — I639 Cerebral infarction, unspecified: Secondary | ICD-10-CM | POA: Diagnosis not present

## 2020-09-28 DIAGNOSIS — I639 Cerebral infarction, unspecified: Secondary | ICD-10-CM | POA: Diagnosis not present

## 2020-09-29 DIAGNOSIS — I639 Cerebral infarction, unspecified: Secondary | ICD-10-CM | POA: Diagnosis not present

## 2020-09-30 ENCOUNTER — Other Ambulatory Visit: Payer: Self-pay

## 2020-09-30 ENCOUNTER — Encounter: Payer: Self-pay | Admitting: Physical Therapy

## 2020-09-30 ENCOUNTER — Ambulatory Visit: Payer: Medicaid Other | Admitting: Physical Therapy

## 2020-09-30 ENCOUNTER — Ambulatory Visit: Payer: Medicaid Other | Admitting: Occupational Therapy

## 2020-09-30 DIAGNOSIS — M6281 Muscle weakness (generalized): Secondary | ICD-10-CM

## 2020-09-30 DIAGNOSIS — R2681 Unsteadiness on feet: Secondary | ICD-10-CM

## 2020-09-30 DIAGNOSIS — I639 Cerebral infarction, unspecified: Secondary | ICD-10-CM | POA: Diagnosis not present

## 2020-09-30 DIAGNOSIS — I69351 Hemiplegia and hemiparesis following cerebral infarction affecting right dominant side: Secondary | ICD-10-CM

## 2020-09-30 DIAGNOSIS — R2689 Other abnormalities of gait and mobility: Secondary | ICD-10-CM

## 2020-09-30 NOTE — Therapy (Signed)
[facility] [address] [place], [place], [address] Phone: [phone]   Fax:  [phone]  Occupational Therapy Treatment  Patient Details  Name: Casey Petty MRN: [id number] Date of Birth: October 28, 1960 Referring Provider (OT): Dr Delice Lesch   Encounter Date: 09/30/2020   OT End of Session - 09/30/20 1124    Visit Number 6    Number of Visits [place] [facility] - 27 visits combined PT/OT/Speech    Authorization - Visit Number 6    Authorization - Number of Visits 9    OT Start Time 0930    OT Stop Time 1015    OT Time Calculation (min) 45 min    Activity Tolerance Patient tolerated treatment well    Behavior During Therapy [facility] for tasks assessed/performed;Restless           Past Medical History:  Diagnosis Date  . Hypertension     Past Surgical History:  Procedure Laterality Date  . NO PAST SURGERIES      There were no vitals filed for this visit.   Subjective Assessment - 09/30/20 0959    Subjective  Denies pain    Pertinent History Lt BG stroke 02/28/19 w/ residual Rt dominant side hemiparesis, adhesive capsulitis of Rt shoulder, **RECENT botox injections on 08/13/20. PMH: HTN    Currently in Pain? No/denies            Continued neuro re-education to reinforce proper stretching and reaching techniques including: self stretch in supine, AA/ROM seated along gravity elim plane, BUE sh flexion into gravity (towards floor). Wt bearing over RUE for body on arm movements.  Discussed preserving remaining 3 appointments for after shoulder injection (11/02/20) and botox (11/17/20) - pt in agreement. Pt to return approx 10 days out from botox                      OT Short Term Goals - 09/30/20 1124      OT SHORT TERM GOAL #1   Title Independent with HEP for RUE    Baseline Has prior program, needs updated as patient moves quickly and is developing pain    Time 4    Period  Weeks    Status Achieved    Target Date 10/03/20      OT SHORT TERM GOAL #2   Title Independent with resting hand splint for pm wear    Baseline dependent, no longer wearing    Time 4    Period Weeks    Status Achieved      OT SHORT TERM GOAL #3   Title Decrease pain with active elbow extension by 2 points (10 point scale)    Baseline 5/10    Time 4    Period Weeks    Status Achieved             OT Long Term Goals - 09/16/20 1250      OT LONG TERM GOAL #1   Title Pt to verbalize understanding of AE needs to increase independence and safety with ADLS (including walker splint, shoe buttons, one handed cutting board)    Baseline mod assist    Time 8    Period Weeks    Status On-going   discussed and issued handouts on 09/16/20 - may need review     OT LONG TERM GOAL #2   Title Pt to report shoulder pain less than or equal to 5/10 with P/ROM  to 130*    Baseline 115 flex, and 105 abd    Time 8    Period Weeks    Status Achieved      OT LONG TERM GOAL #3   Title Patient will tolerate splinting schedule for hand and or elbow orthosis    Baseline inconsistent    Time 8    Period Weeks    Status Achieved      OT LONG TERM GOAL #4   Title Patient will don doff shirt with modified independence    Baseline min assist    Time 8    Period Weeks    Status Achieved   per pt/wife report                Plan - 09/30/20 1124    Clinical Impression Statement Pt has met most goals at this time. Pt is scheduled for shoulder cortisone injection 11/02/20 and botox/dysport on 11/17/20 - therapist/pt agree to preserve remaining 3 visits for after botox.    OT Occupational Profile and History Detailed Assessment- Review of Records and additional review of physical, cognitive, psychosocial history related to current functional performance    Occupational performance deficits (Please refer to evaluation for details): ADL's;IADL's;Rest and Sleep;Social Participation    Body Structure /  Function / Physical Skills ADL;ROM;Balance;IADL;Improper spinal/pelvic alignment;Body mechanics;Sensation;Mobility;Flexibility;Strength;Coordination;Tone;UE functional use;Pain;Decreased knowledge of use of DME;Proprioception;FMC    Rehab Potential Good    Clinical Decision Making Several treatment options, min-mod task modification necessary    OT Frequency 1x / week    OT Duration 8 weeks    OT Treatment/Interventions Self-care/ADL training;Therapeutic exercise;Functional Mobility Training;Aquatic Therapy;Ultrasound;Neuromuscular education;Manual Therapy;Splinting;Therapeutic activities;Coping strategies training;DME and/or AE instruction;Cognitive remediation/compensation;Electrical Stimulation;Moist Heat;Passive range of motion;Patient/family education;Balance training    Plan Pt will be placed on hold for 2 months and return near end of April to continue last 3 O.T. visits following botox/dysport injections for maximum benefit of remaining visits.    Consulted and Agree with Plan of Care Patient;Family member/caregiver    Family Member Consulted [place]           Patient will benefit from skilled therapeutic intervention in order to improve the following deficits and impairments:   Body Structure / Function / Physical Skills: ADL,ROM,Balance,IADL,Improper spinal/pelvic alignment,Body mechanics,Sensation,Mobility,Flexibility,Strength,Coordination,Tone,UE functional use,Pain,Decreased knowledge of use of DME,Proprioception,FMC       Visit Diagnosis: Hemiplegia and hemiparesis following cerebral infarction affecting right dominant side (HCC)  Muscle weakness (generalized)    Problem List Patient Active Problem List   Diagnosis Date Noted  . Chronic right shoulder pain 06/22/2020  . Adhesive capsulitis of right shoulder 10/07/2019  . Abnormality of gait 09/17/2019  . Spastic hemiparesis of right dominant side ([place]) 07/02/2019  . History of CVA with residual deficit  06/09/2019  . Spastic hemiparesis ([place])   . Uncontrolled type 2 diabetes mellitus with hyperglycemia ([place])   . Noncompliance   . Benign essential HTN   . Newly diagnosed diabetes ([place])   . Leucocytosis   . Right hemiparesis ([place])   . 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 Stroke ([place]) 02/28/2019  . Essential hypertension 02/28/2019  . Tobacco abuse 02/28/2019  . Hyperglycemia 02/28/2019    Carey Bullocks, OTR/L 09/30/2020, 11:27 AM  [facility] [address] [place], [place], [address] Phone: [phone]   Fax:  [phone]  Name: Almalik Weissberg MRN: [id number] Date of Birth: Jan 14, 1961

## 2020-10-01 DIAGNOSIS — I639 Cerebral infarction, unspecified: Secondary | ICD-10-CM | POA: Diagnosis not present

## 2020-10-01 NOTE — Therapy (Signed)
[facility] [address] [place], [place], [address] Phone: [phone]   Fax:  [phone]  Physical Therapy Treatment  Patient Details  Name: Casey Petty MRN: [id number] Date of Birth: 1961/06/29 Referring Provider (PT): [name], NP   Encounter Date: 09/30/2020   PT End of Session - 09/30/20 1022    Visit Number 6    Number of Visits 9    Date for PT Re-Evaluation --   POC for 8 weeks   Authorization Type [employer] (VL: 27)    PT Start Time 1018    PT Stop Time 1058    PT Time Calculation (min) 40 min    Equipment Utilized During Treatment Gait belt    Activity Tolerance Patient tolerated treatment well    Behavior During Therapy [facility] for tasks assessed/performed;Restless           Past Medical History:  Diagnosis Date  . Hypertension     Past Surgical History:  Procedure Laterality Date  . NO PAST SURGERIES      There were no vitals filed for this visit.   Subjective Assessment - 09/30/20 1021    Subjective Reports getting the Botox on 10/05/20. Plan to hold OT until after Botox.    Patient is accompained by: Family member    Pertinent History HTN    Limitations Standing;Walking;House hold activities    Patient Stated Goals Get moving better.    Currently in Pain? No/denies    Pain Score 0-No pain                  [facility] Adult PT Treatment/Exercise - 09/30/20 1023      Transfers   Transfers Sit to Stand;Stand to Sit    Sit to Stand 5: Supervision    Stand to Sit 5: Supervision      Ambulation/Gait   Ambulation/Gait Yes    Ambulation/Gait Assistance 5: Supervision;4: Min guard;4: Min assist    Ambulation/Gait Assistance Details with no AD pt needed up to min assist for balance with cues for increased weight shifting onto right LE, increaed hip/knee flexion with swing phase of gait. less assistance needed when pt used cane around session.    Ambulation Distance (Feet) 115 Feet    x1 no AD, plus around gym with session with cane   Assistive device Straight cane;None    Gait Pattern Step-through pattern;Decreased arm swing - right;Decreased step length - right;Decreased step length - left;Decreased stance time - right;Decreased hip/knee flexion - right;Decreased weight shift to right;Poor foot clearance - right    Ambulation Surface Level;Indoor      Neuro Re-ed    Neuro Re-ed Details  for strengthening/muscle re-ed/coordination: red band resisted right LE stepping up to 6 inch box/back down with cues/facilitation for increased hip/knee flexion. then rseated in staggered stance with right foot back/left foot forward for sit<>stands x 10 reps, cues for full upright standing and slow, controlled descent.      Knee/Hip Exercises: Aerobic   Nustep level 6.0 LE's only with green band around legs to keep knee in neutral position with goal >/= 40 steps per minute x 8 minutes for strenghening and activity tolerance.              PT Short Term Goals - 09/16/20 1049      PT SHORT TERM GOAL #1   Title Patient will be independent with initial HEP for strengthening/balance (All STGS due at 5th visit)    Baseline  reports independence    Time 4    Period --   visits   Status Achieved      PT SHORT TERM GOAL #2   Title Patient will improve 5x sit <> stand to <12 seconds without UE support to demonstrate improved balance    Baseline 10.87 secs    Time 4    Period --   visits   Status Achieved      PT SHORT TERM GOAL #3   Title Patient will improve gait speed to >/= 2.3 ft/sec to demosntrate improved community ambulation    Baseline 2.04 ft/sec; 2.30 ft/sec    Time 4    Period --   visits   Status Achieved             PT Long Term Goals - 08/19/20 1209      PT LONG TERM GOAL #1   Title Patient will be independent with final HEP for maintaining functional gains and improved balance/strength (All LTGs due at 9th visit)    Baseline prior HEP provided    Time 8     Period --   visits   Status New      PT LONG TERM GOAL #2   Title Patient will improve Berg Balance Score from 47/56 to >/= 51/56 to demonstrate improved balance and reduce risk of falls    Baseline 47/56    Time 8    Period --   visits   Status New      PT LONG TERM GOAL #3   Title Patient will improve TUG to </= 10 seconds to demosntrate reduced fall risk    Baseline 13.25 secs    Time 8    Period --   visits   Status New      PT LONG TERM GOAL #4   Title Patient will improve gait speed to >/= 3.0 ft/sec to demonstrate improved community mobility    Baseline 2.04 ft/sec    Time 8    Period --   visits   Status New      PT LONG TERM GOAL #5   Title Patient will improve 5 sit <>stand to </=10 seconds for improve BLE strength and reduce risk for falls    Baseline 14.34 secs    Time 8    Period --   visits   Status New                 Plan - 09/30/20 1023    Clinical Impression Statement Today's skilled session continued to focus on gait with cane/no AD, strengthening of right LE and motor control/proprioception of right LE for increased hip/knee flexion/decreased circumduction with no issues noted or reported in session. The pt is progressing toward goals and should benefit from continued PT to progress toward unmet goals.    Personal Factors and Comorbidities Comorbidity 1;Time since onset of injury/illness/exacerbation    Comorbidities HTN    Examination-Activity Limitations Squat;Stairs;Transfers;Locomotion Level;Lift;Reach Overhead    Examination-Participation Restrictions Community Activity    Stability/Clinical Decision Making Stable/Uncomplicated    Rehab Potential Fair    PT Frequency 1x / week    PT Duration 8 weeks   plus eval   PT Treatment/Interventions ADLs/Self Care Home Management;Cryotherapy;Electrical Stimulation;Moist Heat;DME Instruction;Gait training;Stair training;Functional mobility training;Therapeutic activities;Therapeutic exercise;Balance  training;Neuromuscular re-education;Patient/family education;Orthotic Fit/Training;Manual techniques;Passive range of motion    PT Next Visit Plan work on activites to promote improved knee/hip flexion, gait training, strengthening and balance training. work on  obstacle negotiation. try resistaned right LE swing phase with gait when have a second person.    Consulted and Agree with Plan of Care Patient           Patient will benefit from skilled therapeutic intervention in order to improve the following deficits and impairments:  Abnormal gait,Decreased balance,Decreased endurance,Difficulty walking,Increased muscle spasms,Impaired sensation,Impaired tone,Improper body mechanics,Pain,Postural dysfunction,Decreased strength,Decreased safety awareness,Decreased knowledge of use of DME,Decreased activity tolerance,Decreased coordination,Decreased range of motion  Visit Diagnosis: Unsteadiness on feet  Other abnormalities of gait and mobility  Muscle weakness (generalized)     Problem List Patient Active Problem List   Diagnosis Date Noted  . Chronic right shoulder pain 06/22/2020  . Adhesive capsulitis of right shoulder 10/07/2019  . Abnormality of gait 09/17/2019  . Spastic hemiparesis of right dominant side ([place]) 07/02/2019  . History of CVA with residual deficit 06/09/2019  . Spastic hemiparesis ([place])   . Uncontrolled type 2 diabetes mellitus with hyperglycemia ([place])   . Noncompliance   . Benign essential HTN   . Newly diagnosed diabetes ([place])   . Leucocytosis   . Right hemiparesis ([place])   . 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 Stroke ([place]) 02/28/2019  . Essential hypertension 02/28/2019  . Tobacco abuse 02/28/2019  . Hyperglycemia 02/28/2019    Willow Ora, PTA, [facility] [address], [place],  [address] [phone] 10/01/20, 4:27 PM   Name: Aadin Gaut MRN: [id number] Date of Birth: 11-Dec-1960

## 2020-10-02 DIAGNOSIS — I639 Cerebral infarction, unspecified: Secondary | ICD-10-CM | POA: Diagnosis not present

## 2020-10-03 DIAGNOSIS — I639 Cerebral infarction, unspecified: Secondary | ICD-10-CM | POA: Diagnosis not present

## 2020-10-05 DIAGNOSIS — I639 Cerebral infarction, unspecified: Secondary | ICD-10-CM | POA: Diagnosis not present

## 2020-10-06 DIAGNOSIS — I639 Cerebral infarction, unspecified: Secondary | ICD-10-CM | POA: Diagnosis not present

## 2020-10-07 ENCOUNTER — Ambulatory Visit: Payer: Medicaid Other

## 2020-10-07 ENCOUNTER — Ambulatory Visit: Payer: Medicaid Other | Admitting: Occupational Therapy

## 2020-10-07 DIAGNOSIS — I639 Cerebral infarction, unspecified: Secondary | ICD-10-CM | POA: Diagnosis not present

## 2020-10-08 DIAGNOSIS — I639 Cerebral infarction, unspecified: Secondary | ICD-10-CM | POA: Diagnosis not present

## 2020-10-09 DIAGNOSIS — I639 Cerebral infarction, unspecified: Secondary | ICD-10-CM | POA: Diagnosis not present

## 2020-10-10 DIAGNOSIS — I639 Cerebral infarction, unspecified: Secondary | ICD-10-CM | POA: Diagnosis not present

## 2020-10-12 DIAGNOSIS — I639 Cerebral infarction, unspecified: Secondary | ICD-10-CM | POA: Diagnosis not present

## 2020-10-13 DIAGNOSIS — I639 Cerebral infarction, unspecified: Secondary | ICD-10-CM | POA: Diagnosis not present

## 2020-10-14 ENCOUNTER — Encounter: Payer: Medicaid Other | Admitting: Occupational Therapy

## 2020-10-14 ENCOUNTER — Ambulatory Visit: Payer: Medicaid Other | Admitting: Physical Therapy

## 2020-10-14 DIAGNOSIS — I639 Cerebral infarction, unspecified: Secondary | ICD-10-CM | POA: Diagnosis not present

## 2020-10-15 DIAGNOSIS — I639 Cerebral infarction, unspecified: Secondary | ICD-10-CM | POA: Diagnosis not present

## 2020-10-16 DIAGNOSIS — I639 Cerebral infarction, unspecified: Secondary | ICD-10-CM | POA: Diagnosis not present

## 2020-10-16 MED FILL — FARXIGA 10 MG TABLET: 10 | 30 days supply | Qty: 30 | Fill #1

## 2020-10-17 DIAGNOSIS — I639 Cerebral infarction, unspecified: Secondary | ICD-10-CM | POA: Diagnosis not present

## 2020-10-19 DIAGNOSIS — I639 Cerebral infarction, unspecified: Secondary | ICD-10-CM | POA: Diagnosis not present

## 2020-10-20 DIAGNOSIS — I639 Cerebral infarction, unspecified: Secondary | ICD-10-CM | POA: Diagnosis not present

## 2020-10-21 ENCOUNTER — Ambulatory Visit: Payer: Medicaid Other

## 2020-10-21 ENCOUNTER — Encounter: Payer: Medicaid Other | Admitting: Occupational Therapy

## 2020-10-21 DIAGNOSIS — I639 Cerebral infarction, unspecified: Secondary | ICD-10-CM | POA: Diagnosis not present

## 2020-10-22 DIAGNOSIS — I639 Cerebral infarction, unspecified: Secondary | ICD-10-CM | POA: Diagnosis not present

## 2020-10-23 DIAGNOSIS — I639 Cerebral infarction, unspecified: Secondary | ICD-10-CM | POA: Diagnosis not present

## 2020-10-24 DIAGNOSIS — I639 Cerebral infarction, unspecified: Secondary | ICD-10-CM | POA: Diagnosis not present

## 2020-10-26 DIAGNOSIS — I639 Cerebral infarction, unspecified: Secondary | ICD-10-CM | POA: Diagnosis not present

## 2020-10-27 DIAGNOSIS — I639 Cerebral infarction, unspecified: Secondary | ICD-10-CM | POA: Diagnosis not present

## 2020-10-28 DIAGNOSIS — I639 Cerebral infarction, unspecified: Secondary | ICD-10-CM | POA: Diagnosis not present

## 2020-10-30 DIAGNOSIS — I639 Cerebral infarction, unspecified: Secondary | ICD-10-CM | POA: Diagnosis not present

## 2020-10-31 DIAGNOSIS — I639 Cerebral infarction, unspecified: Secondary | ICD-10-CM | POA: Diagnosis not present

## 2020-11-02 ENCOUNTER — Encounter: Payer: Medicaid Other | Attending: Registered Nurse | Admitting: Physical Medicine & Rehabilitation

## 2020-11-02 ENCOUNTER — Encounter: Payer: Self-pay | Admitting: Physical Medicine & Rehabilitation

## 2020-11-02 ENCOUNTER — Other Ambulatory Visit: Payer: Self-pay | Admitting: Physical Medicine & Rehabilitation

## 2020-11-02 ENCOUNTER — Other Ambulatory Visit: Payer: Self-pay

## 2020-11-02 VITALS — BP 151/80 | HR 79 | Temp 97.6°F | Ht 67.0 in | Wt 159.6 lb

## 2020-11-02 DIAGNOSIS — I639 Cerebral infarction, unspecified: Secondary | ICD-10-CM | POA: Diagnosis not present

## 2020-11-02 DIAGNOSIS — M7501 Adhesive capsulitis of right shoulder: Secondary | ICD-10-CM | POA: Insufficient documentation

## 2020-11-02 NOTE — Progress Notes (Signed)
Ultrasound guided intraarticular shoulder injection: Right intra-articular glenohumeral joint  Indication:Shoulder pain not relieved by medication management and other conservative care.  Informed consent was obtained after describing risks and benefits of the procedure with the patient, this includes bleeding, bruising, infection and medication side effects. The patient wishes to proceed and has given written consent. Patient was placed in a seated position with the ipsilateral hand placed on the contralateral shoulder. The right shoulder was marked and prepped with betadine posterior and inferior to the acromium. An ultrasound tranducer was placed inferior and parallel to the scapular spine.  The glenohumeral joint was visualized.  Vapocoolant spray was applied.  A 22 -gauge 2 inch echoblock needle was inserted into the subacromial area under with visualization until the needle was in the joint space. After negative draw back for blood, a solution containing 1 mL of 6 mg per ML betamethasone and 4 mL of 1% lidocaine was injected.  A band aid was applied. The patient with repeated giggling and movement throughout the procedure. Tolerated the procedure well. Post procedure instructions were given.

## 2020-11-03 ENCOUNTER — Other Ambulatory Visit: Payer: Self-pay | Admitting: Physical Medicine & Rehabilitation

## 2020-11-03 DIAGNOSIS — I639 Cerebral infarction, unspecified: Secondary | ICD-10-CM | POA: Diagnosis not present

## 2020-11-03 MED FILL — BACLOFEN 20 MG TABLET: 20 | 20 days supply | Qty: 90 | Fill #0

## 2020-11-04 DIAGNOSIS — I639 Cerebral infarction, unspecified: Secondary | ICD-10-CM | POA: Diagnosis not present

## 2020-11-04 IMAGING — CR DG SHOULDER 2+V*R*
3 series · 3 of 3 positions shown · non-contrast
Comparison: None

CLINICAL DATA: Adhesive capsulitis RIGHT shoulder, RIGHT shoulder
pain, stroke 1 year ago

EXAM:
RIGHT SHOULDER - 2+ VIEW

[shoulder grashey]
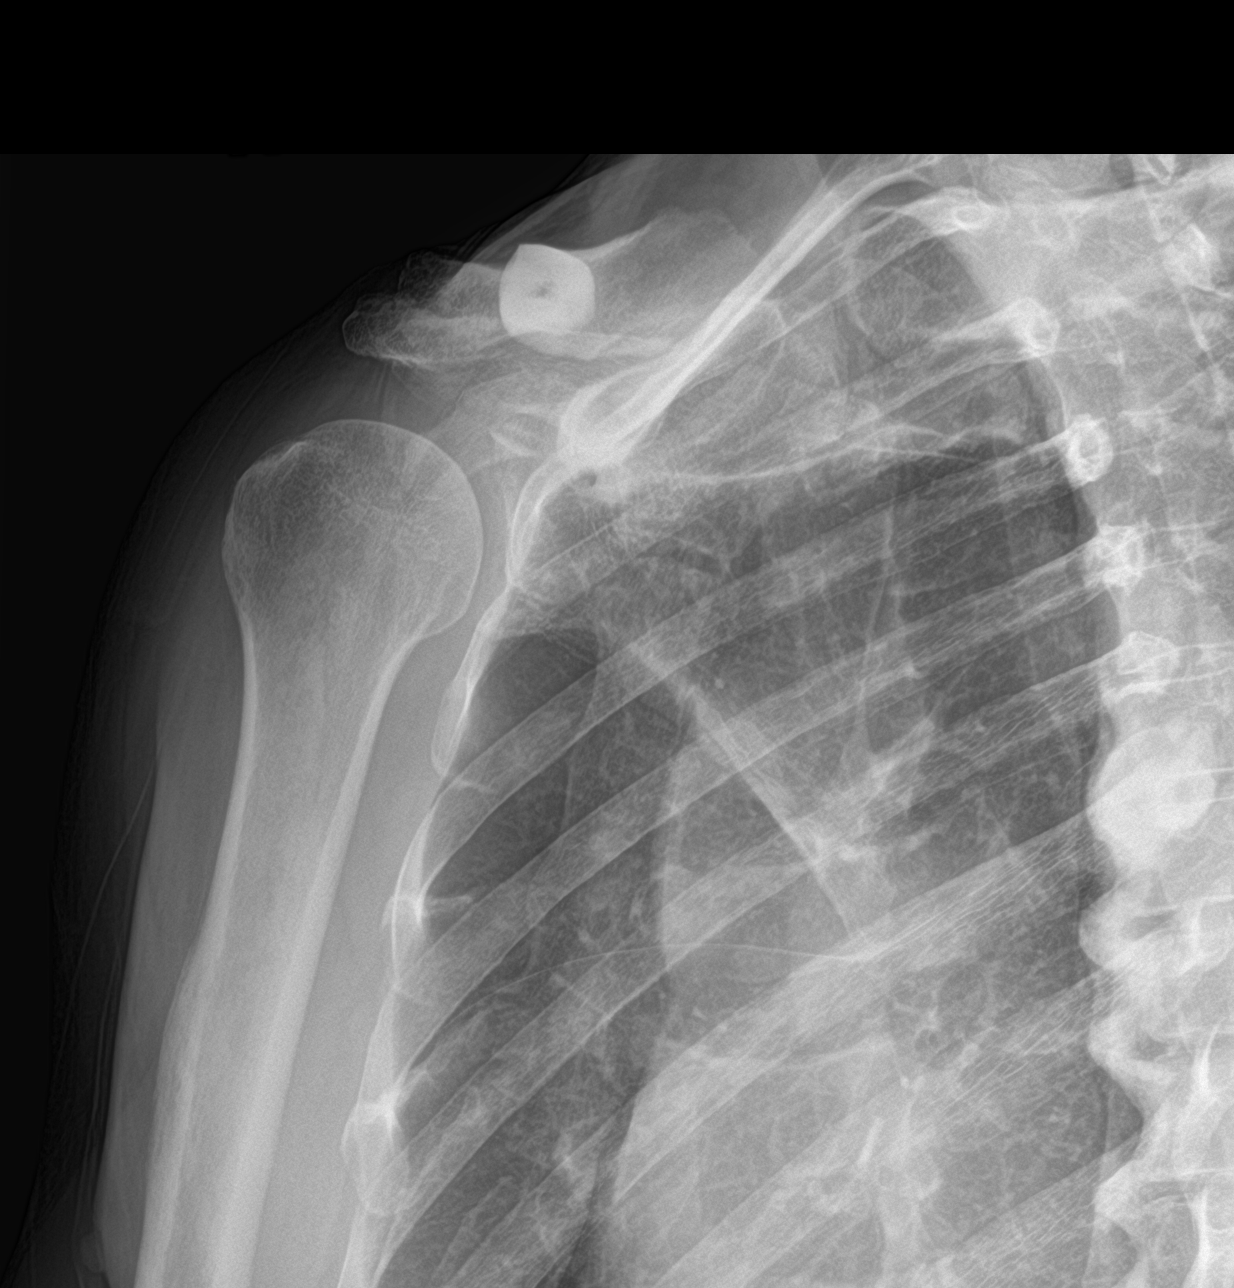

[shoulder y view]
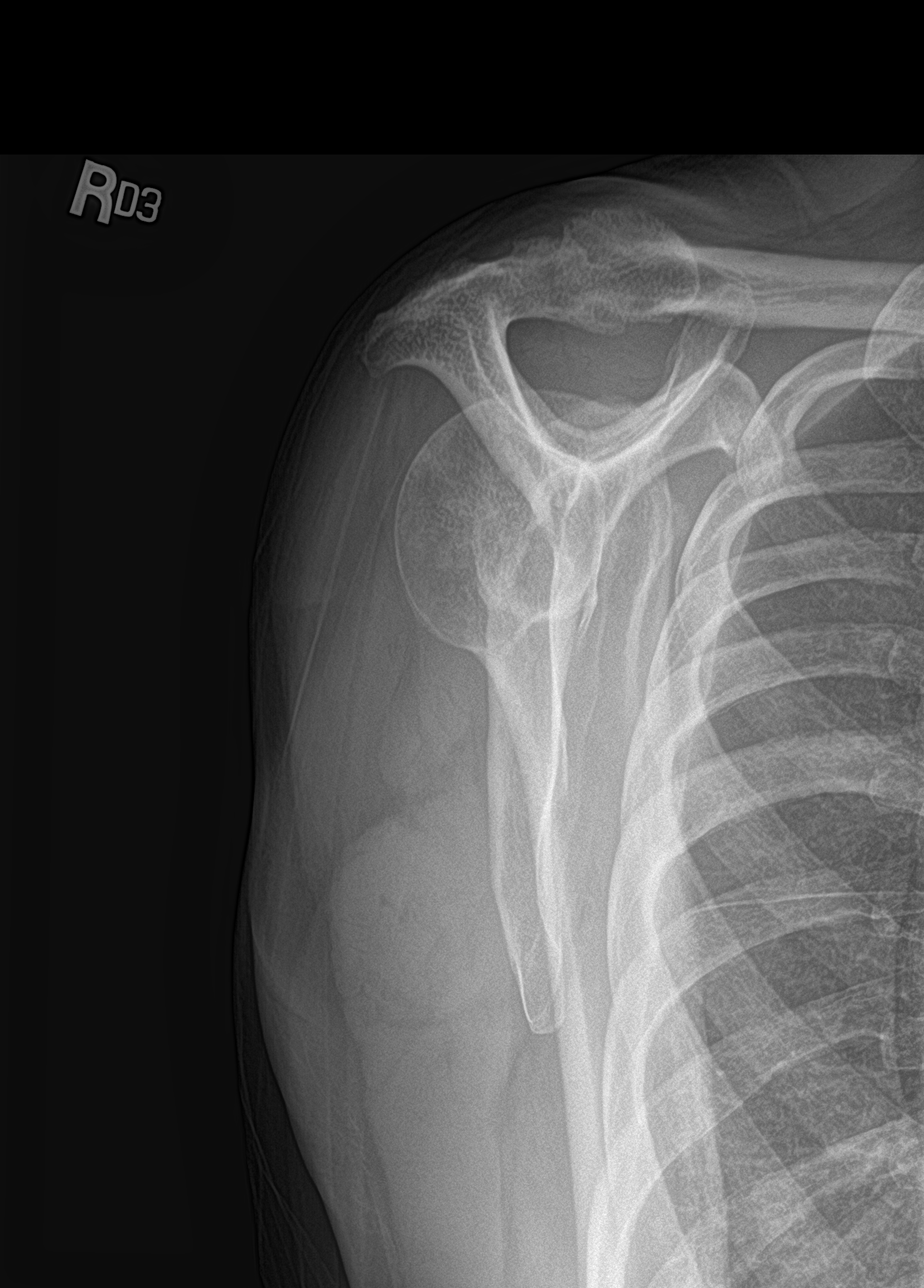

[shoulder axillary]
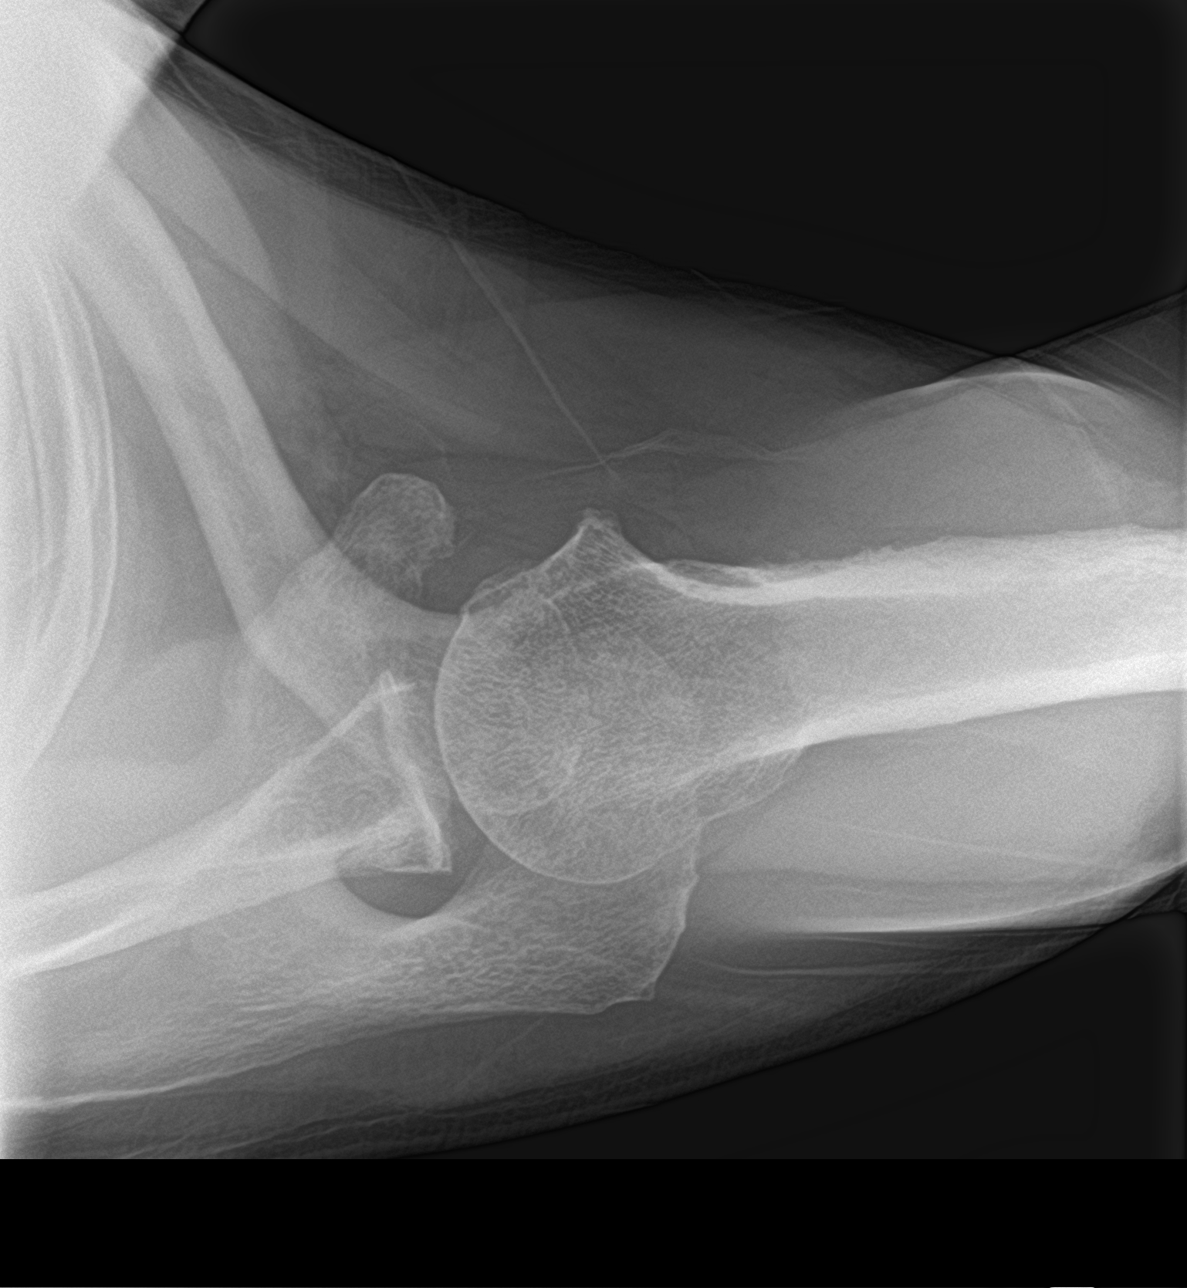

[3 of 3 positions shown; findings below may reference images not displayed]

FINDINGS: Osseous mineralization normal.

Degenerative changes RIGHT AC joint.

No acute fracture, dislocation, or bone destruction.

Visualized RIGHT ribs intact.
IMPRESSION: No acute osseous abnormalities.

Degenerative changes RIGHT AC joint.

## 2020-11-05 DIAGNOSIS — I639 Cerebral infarction, unspecified: Secondary | ICD-10-CM | POA: Diagnosis not present

## 2020-11-06 DIAGNOSIS — I639 Cerebral infarction, unspecified: Secondary | ICD-10-CM | POA: Diagnosis not present

## 2020-11-07 DIAGNOSIS — I639 Cerebral infarction, unspecified: Secondary | ICD-10-CM | POA: Diagnosis not present

## 2020-11-09 ENCOUNTER — Other Ambulatory Visit: Payer: Self-pay

## 2020-11-09 DIAGNOSIS — I639 Cerebral infarction, unspecified: Secondary | ICD-10-CM | POA: Diagnosis not present

## 2020-11-09 MED FILL — Insulin Glargine Soln Pen-Injector 100 Unit/ML: SUBCUTANEOUS | 90 days supply | Qty: 9 | Fill #0 | Status: CN

## 2020-11-10 DIAGNOSIS — I639 Cerebral infarction, unspecified: Secondary | ICD-10-CM | POA: Diagnosis not present

## 2020-11-11 DIAGNOSIS — I639 Cerebral infarction, unspecified: Secondary | ICD-10-CM | POA: Diagnosis not present

## 2020-11-12 DIAGNOSIS — I639 Cerebral infarction, unspecified: Secondary | ICD-10-CM | POA: Diagnosis not present

## 2020-11-13 DIAGNOSIS — I639 Cerebral infarction, unspecified: Secondary | ICD-10-CM | POA: Diagnosis not present

## 2020-11-14 DIAGNOSIS — I639 Cerebral infarction, unspecified: Secondary | ICD-10-CM | POA: Diagnosis not present

## 2020-11-16 DIAGNOSIS — I639 Cerebral infarction, unspecified: Secondary | ICD-10-CM | POA: Diagnosis not present

## 2020-11-17 ENCOUNTER — Ambulatory Visit: Payer: Medicaid Other | Admitting: Physical Medicine & Rehabilitation

## 2020-11-17 DIAGNOSIS — I639 Cerebral infarction, unspecified: Secondary | ICD-10-CM | POA: Diagnosis not present

## 2020-11-18 DIAGNOSIS — E119 Type 2 diabetes mellitus without complications: Secondary | ICD-10-CM | POA: Diagnosis not present

## 2020-11-18 DIAGNOSIS — H2513 Age-related nuclear cataract, bilateral: Secondary | ICD-10-CM | POA: Diagnosis not present

## 2020-11-18 DIAGNOSIS — I639 Cerebral infarction, unspecified: Secondary | ICD-10-CM | POA: Diagnosis not present

## 2020-11-18 DIAGNOSIS — H40013 Open angle with borderline findings, low risk, bilateral: Secondary | ICD-10-CM | POA: Diagnosis not present

## 2020-11-18 LAB — HM DIABETES EYE EXAM

## 2020-11-19 DIAGNOSIS — I639 Cerebral infarction, unspecified: Secondary | ICD-10-CM | POA: Diagnosis not present

## 2020-11-20 DIAGNOSIS — I639 Cerebral infarction, unspecified: Secondary | ICD-10-CM | POA: Diagnosis not present

## 2020-11-21 DIAGNOSIS — I639 Cerebral infarction, unspecified: Secondary | ICD-10-CM | POA: Diagnosis not present

## 2020-11-23 ENCOUNTER — Ambulatory Visit: Payer: Medicaid Other | Admitting: Family Medicine

## 2020-11-23 ENCOUNTER — Other Ambulatory Visit: Payer: Self-pay

## 2020-11-23 DIAGNOSIS — I639 Cerebral infarction, unspecified: Secondary | ICD-10-CM | POA: Diagnosis not present

## 2020-11-23 MED FILL — Insulin Glargine Soln Pen-Injector 100 Unit/ML: SUBCUTANEOUS | 30 days supply | Qty: 3 | Fill #0 | Status: AC

## 2020-11-24 ENCOUNTER — Encounter: Payer: Self-pay | Admitting: Physical Medicine & Rehabilitation

## 2020-11-24 ENCOUNTER — Other Ambulatory Visit: Payer: Self-pay

## 2020-11-24 ENCOUNTER — Encounter: Payer: Medicaid Other | Attending: Registered Nurse | Admitting: Physical Medicine & Rehabilitation

## 2020-11-24 VITALS — BP 135/77 | HR 71 | Temp 98.0°F | Ht 67.0 in | Wt 162.0 lb

## 2020-11-24 DIAGNOSIS — I639 Cerebral infarction, unspecified: Secondary | ICD-10-CM | POA: Diagnosis not present

## 2020-11-24 DIAGNOSIS — G8111 Spastic hemiplegia affecting right dominant side: Secondary | ICD-10-CM | POA: Diagnosis not present

## 2020-11-24 NOTE — Progress Notes (Signed)
Dysport: Procedure Note Patient Name: Casey Petty DOB: 1960-08-23 MRN: [id number]  Date: 11/24/2020  Procedure: Botulinum toxin administration Guidance: EMG Diagnosis: Spastic Hemiparesis Attending: Delice Lesch, MD    Informed consent: Risks, benefits & options of the procedure are explained to the patient (and/or family). The patient elects to proceed with procedure. Risks include but are not limited to weakness, respiratory distress, dry mouth, ptosis, antibody formation, worsening of some areas of function. Benefits include decreased abnormal muscle tone, improved hygiene and positioning, decreased skin breakdown and, in some cases, decreased pain. Options include conservative management with oral antispasticity agents, phenol chemodenervation of nerve or at motor nerve branches. More invasive options include intrathecal balcofen adminstration for appropriate candidates. Surgical options may include tendon lengthening or transposition or, rarely, dorsal rhizotomy.   History/Physical Examination: 60 y.o. male with history of HTN presents for follow up for left basal ganglia stroke.   mAS Right       Shoulder abductions 2/4                                     Elbow flexors 2/4                                     Wrist flextors 3/4                                     Finger flexors 2/4                                      Ankle dorsiflexors 1+/4  Previous Treatments: Therapy/Range of motion Indication for guidance: Target active muscules  Procedure: Botulinum toxin was mixed with preservative free saline with a dilution of 0.5cc to 100 units. Targeted limb and muscles were identified. The skin was prepped with alcohol swabs and placement of needle tip in targeted muscle was confirmed using appropriate guidance. Prior to injection, positioning of needle tip outside of blood vessel was determined by pulling back on syringe plunger.  MUSCLE UNITS Right Pec  Major:50units  Biceps:150                                     Brachioradialis 100 FCR350 FCU250 FDS200 FDP100  Right Gastroc Med50 Lastroc Lat50   Total units used: 5597 Complications: None Plan: 6 weeks  Rain Friedt An[place] 10:58 AM

## 2020-11-25 DIAGNOSIS — I639 Cerebral infarction, unspecified: Secondary | ICD-10-CM | POA: Diagnosis not present

## 2020-11-26 DIAGNOSIS — I639 Cerebral infarction, unspecified: Secondary | ICD-10-CM | POA: Diagnosis not present

## 2020-11-27 DIAGNOSIS — I639 Cerebral infarction, unspecified: Secondary | ICD-10-CM | POA: Diagnosis not present

## 2020-11-28 DIAGNOSIS — I639 Cerebral infarction, unspecified: Secondary | ICD-10-CM | POA: Diagnosis not present

## 2020-11-30 ENCOUNTER — Encounter: Payer: Medicaid Other | Admitting: Occupational Therapy

## 2020-11-30 DIAGNOSIS — I639 Cerebral infarction, unspecified: Secondary | ICD-10-CM | POA: Diagnosis not present

## 2020-12-01 DIAGNOSIS — I639 Cerebral infarction, unspecified: Secondary | ICD-10-CM | POA: Diagnosis not present

## 2020-12-02 ENCOUNTER — Ambulatory Visit: Payer: Medicaid Other | Attending: Adult Health | Admitting: Occupational Therapy

## 2020-12-02 ENCOUNTER — Other Ambulatory Visit: Payer: Self-pay

## 2020-12-02 DIAGNOSIS — I69351 Hemiplegia and hemiparesis following cerebral infarction affecting right dominant side: Secondary | ICD-10-CM | POA: Diagnosis present

## 2020-12-02 DIAGNOSIS — I639 Cerebral infarction, unspecified: Secondary | ICD-10-CM | POA: Diagnosis not present

## 2020-12-02 DIAGNOSIS — M6281 Muscle weakness (generalized): Secondary | ICD-10-CM | POA: Insufficient documentation

## 2020-12-02 NOTE — Therapy (Signed)
[facility] [address] [place], [place], [address] Phone: [phone]   Fax:  [phone]  Occupational Therapy Treatment  Patient Details  Name: Casey Petty MRN: [id number] Date of Birth: 11/15/1960 Referring Provider (OT): Dr Delice Lesch   Encounter Date: 12/02/2020   OT End of Session - 12/02/20 1029    Visit Number 7    Number of Visits 9    Authorization Type U[employer] MCD - 27 visits combined PT/OT/Speech    Authorization - Visit Number 7    Authorization - Number of Visits 9    OT Start Time 0800    OT Stop Time 0841    OT Time Calculation (min) 41 min    Activity Tolerance Patient tolerated treatment well    Behavior During Therapy [facility] for tasks assessed/performed;Restless           Past Medical History:  Diagnosis Date  . Hypertension     Past Surgical History:  Procedure Laterality Date  . NO PAST SURGERIES      There were no vitals filed for this visit.   Subjective Assessment - 12/02/20 0803    Subjective  It feels looser since the botox (RUE)    Pertinent History Lt BG stroke 02/28/19 w/ residual Rt dominant side hemiparesis, adhesive capsulitis of Rt shoulder, **RECENT botox injections on 08/13/20. PMH: HTN    Currently in Pain? No/denies           Pt returns today after botox to RUE. Reviewed HEP's and cued to move slower for full benefit. AA/ROM BUE's towards floor and in gravity elim plane. Worked on trunk rotation and lats stretch bilaterally. Wt bearing over RUE w/ body on arm movements reaching w/ LUE                       OT Short Term Goals - 09/30/20 1124      OT SHORT TERM GOAL #1   Title Independent with HEP for RUE    Baseline Has prior program, needs updated as patient moves quickly and is developing pain    Time 4    Period Weeks    Status Achieved    Target Date 10/03/20      OT SHORT TERM GOAL #2   Title Independent with resting hand splint for pm  wear    Baseline dependent, no longer wearing    Time 4    Period Weeks    Status Achieved      OT SHORT TERM GOAL #3   Title Decrease pain with active elbow extension by 2 points (10 point scale)    Baseline 5/10    Time 4    Period Weeks    Status Achieved             OT Long Term Goals - 09/16/20 1250      OT LONG TERM GOAL #1   Title Pt to verbalize understanding of AE needs to increase independence and safety with ADLS (including walker splint, shoe buttons, one handed cutting board)    Baseline mod assist    Time 8    Period Weeks    Status On-going   discussed and issued handouts on 09/16/20 - may need review     OT LONG TERM GOAL #2   Title Pt to report shoulder pain less than or equal to 5/10 with P/ROM to 130*    Baseline 115 flex, and 105 abd  Time 8    Period Weeks    Status Achieved      OT LONG TERM GOAL #3   Title Patient will tolerate splinting schedule for hand and or elbow orthosis    Baseline inconsistent    Time 8    Period Weeks    Status Achieved      OT LONG TERM GOAL #4   Title Patient will don doff shirt with modified independence    Baseline min assist    Time 8    Period Weeks    Status Achieved   per pt/wife report                Plan - 12/02/20 1029    Clinical Impression Statement Pt returns today after botox injections and reports feeling "looser". Pt tolerating therapy well and appears to be able to stretch further following botox    OT Occupational Profile and History Detailed Assessment- Review of Records and additional review of physical, cognitive, psychosocial history related to current functional performance    Occupational performance deficits (Please refer to evaluation for details): ADL's;IADL's;Rest and Sleep;Social Participation    Body Structure / Function / Physical Skills ADL;ROM;Balance;IADL;Improper spinal/pelvic alignment;Body mechanics;Sensation;Mobility;Flexibility;Strength;Coordination;Tone;UE functional  use;Pain;Decreased knowledge of use of DME;Proprioception;FMC    Rehab Potential Good    Clinical Decision Making Several treatment options, min-mod task modification necessary    OT Frequency 1x / week    OT Duration 8 weeks    OT Treatment/Interventions Self-care/ADL training;Therapeutic exercise;Functional Mobility Training;Aquatic Therapy;Ultrasound;Neuromuscular education;Manual Therapy;Splinting;Therapeutic activities;Coping strategies training;DME and/or AE instruction;Cognitive remediation/compensation;Electrical Stimulation;Moist Heat;Passive range of motion;Patient/family education;Balance training    Plan continue NMR next 2 visits then d/c    Consulted and Agree with Plan of Care Patient;Family member/caregiver    Family Member Consulted [place]           Patient will benefit from skilled therapeutic intervention in order to improve the following deficits and impairments:   Body Structure / Function / Physical Skills: ADL,ROM,Balance,IADL,Improper spinal/pelvic alignment,Body mechanics,Sensation,Mobility,Flexibility,Strength,Coordination,Tone,UE functional use,Pain,Decreased knowledge of use of DME,Proprioception,FMC       Visit Diagnosis: Hemiplegia and hemiparesis following cerebral infarction affecting right dominant side (HCC)  Muscle weakness (generalized)    Problem List Patient Active Problem List   Diagnosis Date Noted  . Chronic right shoulder pain 06/22/2020  . Adhesive capsulitis of right shoulder 10/07/2019  . Abnormality of gait 09/17/2019  . Spastic hemiparesis of right dominant side ([place]) 07/02/2019  . History of CVA with residual deficit 06/09/2019  . Spastic hemiparesis ([place])   . Uncontrolled type 2 diabetes mellitus with hyperglycemia ([place])   . Noncompliance   . Benign essential HTN   . Newly diagnosed diabetes ([place])   . Leucocytosis   . Right hemiparesis ([place])   . 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 Stroke ([place]) 02/28/2019  .  Essential hypertension 02/28/2019  . Tobacco abuse 02/28/2019  . Hyperglycemia 02/28/2019    Carey Bullocks, OTR/L 12/02/2020, 10:31 AM  [facility] [address] [place], [place], [address] Phone: [phone]   Fax:  [phone]  Name: Casey Petty MRN: [id number] Date of Birth: August 24, 1960

## 2020-12-03 DIAGNOSIS — I639 Cerebral infarction, unspecified: Secondary | ICD-10-CM | POA: Diagnosis not present

## 2020-12-04 DIAGNOSIS — I639 Cerebral infarction, unspecified: Secondary | ICD-10-CM | POA: Diagnosis not present

## 2020-12-05 DIAGNOSIS — I639 Cerebral infarction, unspecified: Secondary | ICD-10-CM | POA: Diagnosis not present

## 2020-12-07 ENCOUNTER — Encounter: Payer: Medicaid Other | Admitting: Occupational Therapy

## 2020-12-08 DIAGNOSIS — I639 Cerebral infarction, unspecified: Secondary | ICD-10-CM | POA: Diagnosis not present

## 2020-12-09 ENCOUNTER — Other Ambulatory Visit: Payer: Self-pay

## 2020-12-09 ENCOUNTER — Ambulatory Visit: Payer: Medicaid Other

## 2020-12-09 ENCOUNTER — Ambulatory Visit: Payer: Medicaid Other | Attending: Adult Health | Admitting: Occupational Therapy

## 2020-12-09 ENCOUNTER — Ambulatory Visit: Payer: Medicaid Other | Admitting: Family Medicine

## 2020-12-09 DIAGNOSIS — R2689 Other abnormalities of gait and mobility: Secondary | ICD-10-CM | POA: Insufficient documentation

## 2020-12-09 DIAGNOSIS — I69351 Hemiplegia and hemiparesis following cerebral infarction affecting right dominant side: Secondary | ICD-10-CM | POA: Diagnosis present

## 2020-12-09 DIAGNOSIS — M6281 Muscle weakness (generalized): Secondary | ICD-10-CM

## 2020-12-09 DIAGNOSIS — R2681 Unsteadiness on feet: Secondary | ICD-10-CM | POA: Diagnosis present

## 2020-12-09 DIAGNOSIS — I639 Cerebral infarction, unspecified: Secondary | ICD-10-CM | POA: Diagnosis not present

## 2020-12-09 NOTE — Therapy (Signed)
[facility] [address] [place], [place], [address] Phone: [phone]   Fax:  [phone]  Occupational Therapy Treatment  Patient Details  Name: Casey Petty MRN: [id number] Date of Birth: [birth year] Referring Provider (OT): Dr Delice Lesch   Encounter Date: 12/09/2020   OT End of Session - 12/09/20 0849    Visit Number 8    Number of Visits 9    Authorization Type U[employer] MCD - 27 visits combined PT/OT/Speech    Authorization - Visit Number 8    Authorization - Number of Visits 9    OT Start Time 0800    OT Stop Time 0845    OT Time Calculation (min) 45 min    Activity Tolerance Patient tolerated treatment well    Behavior During Therapy [facility] for tasks assessed/performed;Restless           Past Medical History:  Diagnosis Date  . Hypertension     Past Surgical History:  Procedure Laterality Date  . NO PAST SURGERIES      There were no vitals filed for this visit.   Subjective Assessment - 12/09/20 0800    Subjective  Doing good    Pertinent History Lt BG stroke 02/28/19 w/ residual Rt dominant side hemiparesis, adhesive capsulitis of Rt shoulder, **RECENT botox injections on 08/13/20. PMH: HTN    Currently in Pain? No/denies           Supine: P/ROM in shoulder flex, abd, elbow ext, wrist ext, thumb palmer abduction, and forearm supination. Pt shown trunk rotation/spinal twist in supine and performed x 3 reps each side holding 10 sec.  Seated: trunk rotation/spinal twist w/ arm stretch bilaterally. AA/ROM into gravity and gravity elim plane BUE's. Body on arm movements wt bearing over RUE w/ cross reaching LUE                      OT Education - 12/09/20 0833    Education Details trunk rotation stretches (supine and seated)    Person(s) Educated Patient;Spouse    Methods Explanation;Demonstration;Verbal cues;Handout    Comprehension Verbalized understanding;Returned demonstration;Verbal  cues required            OT Short Term Goals - 09/30/20 1124      OT SHORT TERM GOAL #1   Title Independent with HEP for RUE    Baseline Has prior program, needs updated as patient moves quickly and is developing pain    Time 4    Period Weeks    Status Achieved    Target Date 10/03/20      OT SHORT TERM GOAL #2   Title Independent with resting hand splint for pm wear    Baseline dependent, no longer wearing    Time 4    Period Weeks    Status Achieved      OT SHORT TERM GOAL #3   Title Decrease pain with active elbow extension by 2 points (10 point scale)    Baseline 5/10    Time 4    Period Weeks    Status Achieved             OT Long Term Goals - 09/16/20 1250      OT LONG TERM GOAL #1   Title Pt to verbalize understanding of AE needs to increase independence and safety with ADLS (including walker splint, shoe buttons, one handed cutting board)    Baseline mod assist    Time 8  Period Weeks    Status On-going   discussed and issued handouts on 09/16/20 - may need review     OT LONG TERM GOAL #2   Title Pt to report shoulder pain less than or equal to 5/10 with P/ROM to 130*    Baseline 115 flex, and 105 abd    Time 8    Period Weeks    Status Achieved      OT LONG TERM GOAL #3   Title Patient will tolerate splinting schedule for hand and or elbow orthosis    Baseline inconsistent    Time 8    Period Weeks    Status Achieved      OT LONG TERM GOAL #4   Title Patient will don doff shirt with modified independence    Baseline min assist    Time 8    Period Weeks    Status Achieved   per pt/wife report                Plan - 12/09/20 0850    Clinical Impression Statement Pt reports supine stretch feels good. Pt progressing w/ stretches    OT Occupational Profile and History Detailed Assessment- Review of Records and additional review of physical, cognitive, psychosocial history related to current functional performance    Occupational  performance deficits (Please refer to evaluation for details): ADL's;IADL's;Rest and Sleep;Social Participation    Body Structure / Function / Physical Skills ADL;ROM;Balance;IADL;Improper spinal/pelvic alignment;Body mechanics;Sensation;Mobility;Flexibility;Strength;Coordination;Tone;UE functional use;Pain;Decreased knowledge of use of DME;Proprioception;FMC    Rehab Potential Good    Clinical Decision Making Several treatment options, min-mod task modification necessary    OT Frequency 1x / week    OT Duration 8 weeks    OT Treatment/Interventions Self-care/ADL training;Therapeutic exercise;Functional Mobility Training;Aquatic Therapy;Ultrasound;Neuromuscular education;Manual Therapy;Splinting;Therapeutic activities;Coping strategies training;DME and/or AE instruction;Cognitive remediation/compensation;Electrical Stimulation;Moist Heat;Passive range of motion;Patient/family education;Balance training    Plan continue NMR, d/c next session    Consulted and Agree with Plan of Care Patient;Family member/caregiver    Family Member Consulted [place]           Patient will benefit from skilled therapeutic intervention in order to improve the following deficits and impairments:   Body Structure / Function / Physical Skills: ADL,ROM,Balance,IADL,Improper spinal/pelvic alignment,Body mechanics,Sensation,Mobility,Flexibility,Strength,Coordination,Tone,UE functional use,Pain,Decreased knowledge of use of DME,Proprioception,FMC       Visit Diagnosis: Hemiplegia and hemiparesis following cerebral infarction affecting right dominant side (HCC)  Muscle weakness (generalized)  Unsteadiness on feet    Problem List Patient Active Problem List   Diagnosis Date Noted  . Chronic right shoulder pain 06/22/2020  . Adhesive capsulitis of right shoulder 10/07/2019  . Abnormality of gait 09/17/2019  . Spastic hemiparesis of right dominant side ([place]) 07/02/2019  . History of CVA with residual  deficit 06/09/2019  . Spastic hemiparesis ([place])   . Uncontrolled type 2 diabetes mellitus with hyperglycemia ([place])   . Noncompliance   . Benign essential HTN   . Newly diagnosed diabetes ([place])   . Leucocytosis   . Right hemiparesis ([place])   . Basal ganglia stroke ([place]) 03/04/2019  . Stroke ([place]) 02/28/2019  . Essential hypertension 02/28/2019  . Tobacco abuse 02/28/2019  . Hyperglycemia 02/28/2019    Carey Bullocks, OTR/L 12/09/2020, 8:51 AM  [facility] [address] [place], [place], [address] Phone: [phone]   Fax:  [phone]  Name: Casey Petty MRN: [id number] Date of Birth: Sep 19, 1960

## 2020-12-09 NOTE — Patient Instructions (Signed)
Access Code: [email or web address] URL: [email or web address] Date: 12/09/2020 Prepared by: Cherly Anderson  Exercises Supine Bridge with Resistance Band - 1 x daily - 5 x weekly - 10 reps - 2 sets Hooklying Single Leg Bent Knee Fallouts with Resistance - 1 x daily - 5 x weekly - 2 sets - 10 reps Seated Ankle Plantarflexion with Resistance - 1 x daily - 5 x weekly - 1 sets - 10 reps Seated Hamstring Stretch - 1 x daily - 5 x weekly - 1 sets - 3 reps - 30 hold Sit to/from Stand in Stride position - 1 x daily - 5 x weekly - 1 sets - 10 reps Standing March with Unilateral Counter Support - 1 x daily - 5 x weekly - 1 sets - 10 reps Mini Squat - 1 x daily - 5 x weekly - 1 sets - 10 reps Side Stepping with Counter Support - 1 x daily - 7 x weekly - 10 reps - 3 sets Backward Walking with Counter Support - 1 x daily - 7 x weekly - 10 reps - 3 sets Standing Gastroc Stretch - 2 x daily - 7 x weekly - 1 sets - 3 reps - 30 sec hold

## 2020-12-09 NOTE — Therapy (Signed)
[facility] [address] [place], [place], [address] Phone: [phone]   Fax:  [phone]  Physical Therapy Treatment  Patient Details  Name: Casey Petty MRN: [id number] Date of Birth: [birth year] Referring Provider (PT): [name], NP   Encounter Date: 12/09/2020   PT End of Session - 12/09/20 0848    Visit Number 7    Number of Visits 9    Date for PT Re-Evaluation --   POC for 8 weeks   Authorization Type UHC Medicaid (VL: 27)    PT Start Time 0845    PT Stop Time 0931    PT Time Calculation (min) 46 min    Equipment Utilized During Treatment Gait belt    Activity Tolerance Patient tolerated treatment well    Behavior During Therapy [facility] for tasks assessed/performed;Restless           Past Medical History:  Diagnosis Date  . Hypertension     Past Surgical History:  Procedure Laterality Date  . NO PAST SURGERIES      There were no vitals filed for this visit.   Subjective Assessment - 12/09/20 0849    Subjective Pt report that he is doing well. Had his Botox 10/05/20 in arm and right leg. He reports that he is getting more spasms at night in leg but seems to be waking up more.    Patient is accompained by: Family member    Pertinent History HTN    Limitations Standing;Walking;House hold activities    Patient Stated Goals Get moving better.    Currently in Pain? No/denies                             [facility] Adult PT Treatment/Exercise - 12/09/20 0850      Ambulation/Gait   Ambulation/Gait Yes    Ambulation/Gait Assistance 5: Supervision    Ambulation/Gait Assistance Details Pt was given verbal cues to increase right hip flexion. During gait outside PT provided tactile cues at pelvis to facilitate anterior rotation during swing Petty right and prevent hip hike which did improve quality.    Ambulation Distance (Feet) 230 Feet   550' outside   Assistive device Straight cane   with  quad tip, right AFO   Gait Pattern Step-through pattern;Decreased hip/knee flexion - right    Ambulation Surface Level;Unlevel;Indoor;Outdoor;Paved    Stairs Yes    Stairs Assistance 6: Modified independent (Device/Increase time)    Stair Management Technique One rail Left;Step to pattern    Number of Stairs 4    Height of Stairs 6    Ramp 5: Supervision    Ramp Details (indicate cue type and reason) up/down x 2 with verbal cues to increase right hip flexion with ascent.      Neuro Re-ed    Neuro Re-ed Details  Standing at bottom of steps: RLE Petty bottom step rocking forward and backward for knee flexion x 10. Pt has to circumduct to get right foot to step. In // bars: placing RLE Petty rockerboard positioned ant/post then stepping Petty to board and trying to stabilize x 10 with verbal and tactile cues to weight shift to right and try to decrease LUE support, then performed stepping Petty to rockerboard and tapping cone in front with LLE and bringing it all the way back x 10 to increase right SLS time. Tapping 4" step with RLE x 10 with min assist for PT to help facilitate  knee flexion. Stepping over 2x4" bolster and back with RLE with PT preventing right hip hike. Pt was challenged to clear foot and especially when bringing leg back.      Exercises   Exercises Other Exercises    Other Exercises  PT removed right AFO to assess right ankle ROM and strength. No active contraction noted with DF and 2-/5 PF. Pt has almost neutral passive right DF range. Discussed and demonstrated passive DF stretch that caregiver can perform and also discussed using strap under ball of foot to stretch seated. Performed standing gastroc stretch Petty right x 30 sec holding to chair.                  PT Education - 12/09/20 0946    Education Details Added standing gastroc stretch to HEP. Discussed importance of wearing AFO when walking to prevent injury to ankle and also to decrease compensatory techniques like hip hike.     Person(s) Educated Patient;Other (comment)   fiance   Methods Explanation;Demonstration;Handout    Comprehension Verbalized understanding;Returned demonstration            PT Short Term Goals - 09/16/20 1049      PT SHORT TERM GOAL #1   Title Patient will be independent with initial HEP for strengthening/balance (All STGS due at 5th visit)    Baseline reports independence    Time 4    Period --   visits   Status Achieved      PT SHORT TERM GOAL #2   Title Patient will improve 5x sit <> stand to <12 seconds without UE support to demonstrate improved balance    Baseline 10.87 secs    Time 4    Period --   visits   Status Achieved      PT SHORT TERM GOAL #3   Title Patient will improve gait speed to >/= 2.3 ft/sec to demosntrate improved community ambulation    Baseline 2.04 ft/sec; 2.30 ft/sec    Time 4    Period --   visits   Status Achieved             PT Long Term Goals - 08/19/20 1209      PT LONG TERM GOAL #1   Title Patient will be independent with final HEP for maintaining functional gains and improved balance/strength (All LTGs due at 9th visit)    Baseline prior HEP provided    Time 8    Period --   visits   Status New      PT LONG TERM GOAL #2   Title Patient will improve Berg Balance Score from 47/56 to >/= 51/56 to demonstrate improved balance and reduce risk of falls    Baseline 47/56    Time 8    Period --   visits   Status New      PT LONG TERM GOAL #3   Title Patient will improve TUG to </= 10 seconds to demosntrate reduced fall risk    Baseline 13.25 secs    Time 8    Period --   visits   Status New      PT LONG TERM GOAL #4   Title Patient will improve gait speed to >/= 3.0 ft/sec to demonstrate improved community mobility    Baseline 2.04 ft/sec    Time 8    Period --   visits   Status New      PT LONG TERM GOAL #5   Title  Patient will improve 5 sit <>stand to </=10 seconds for improve BLE strength and reduce risk for falls     Baseline 14.34 secs    Time 8    Period --   visits   Status New                 Plan - 12/09/20 0947    Clinical Impression Statement PT continued to work Petty improving gait quality with trying to decrease right hip hike circumduction. Pt did well with tactile cues at pelvis. Also worked Petty activities to increase SLS time Petty right Petty nonstable surface. Pt needed some UE support for balance.    Personal Factors and Comorbidities Comorbidity 1;Time since onset of injury/illness/exacerbation    Comorbidities HTN    Examination-Activity Limitations Squat;Stairs;Transfers;Locomotion Level;Lift;Reach Overhead    Examination-Participation Restrictions Community Activity    Stability/Clinical Decision Making Stable/Uncomplicated    Rehab Potential Fair    PT Frequency 1x / week    PT Duration 8 weeks   plus eval   PT Treatment/Interventions ADLs/Self Care Home Management;Cryotherapy;Electrical Stimulation;Moist Heat;DME Instruction;Gait training;Stair training;Functional mobility training;Therapeutic activities;Therapeutic exercise;Balance training;Neuromuscular re-education;Patient/family education;Orthotic Fit/Training;Manual techniques;Passive range of motion    PT Next Visit Plan work Petty activites to promote improved knee/hip flexion, gait training, strengthening and balance training Petty compliant surfaces/rockerboard. work Petty Secondary school teacher.    Consulted and Agree with Plan of Care Patient           Patient will benefit from skilled therapeutic intervention in order to improve the following deficits and impairments:  Abnormal gait,Decreased balance,Decreased endurance,Difficulty walking,Increased muscle spasms,Impaired sensation,Impaired tone,Improper body mechanics,Pain,Postural dysfunction,Decreased strength,Decreased safety awareness,Decreased knowledge of use of DME,Decreased activity tolerance,Decreased coordination,Decreased range of motion  Visit Diagnosis: Other  abnormalities of gait and mobility  Muscle weakness (generalized)     Problem List Patient Active Problem List   Diagnosis Date Noted  . Chronic right shoulder pain 06/22/2020  . Adhesive capsulitis of right shoulder 10/07/2019  . Abnormality of gait 09/17/2019  . Spastic hemiparesis of right dominant side ([place]) 07/02/2019  . History of CVA with residual deficit 06/09/2019  . Spastic hemiparesis ([place])   . Uncontrolled type 2 diabetes mellitus with hyperglycemia ([place])   . Noncompliance   . Benign essential HTN   . Newly diagnosed diabetes ([place])   . Leucocytosis   . Right hemiparesis ([place])   . Basal ganglia stroke ([place]) 03/04/2019  . Stroke ([place]) 02/28/2019  . Essential hypertension 02/28/2019  . Tobacco abuse 02/28/2019  . Hyperglycemia 02/28/2019    Electa Sniff, PT, DPT, NCS 12/09/2020, 9:50 AM  [facility] [address] [place], [place], [address] Phone: [phone]   Fax:  [phone]  Name: Casey Petty MRN: [id number] Date of Birth: 1960-09-08

## 2020-12-09 NOTE — Patient Instructions (Addendum)
Reclined Spine Twist - Beginner    Hold rolled blanket, pillow, or block between knees. Shoulders on mat, roll legs down to side, knees stacked, opposite shoulder anchored. Hold for _5___ breaths. Repeat for __5__ times each side.  Twist - Sitting    Hold Rt arm with Lt hand stretched out.  Slowly turn upper trunk and head to right, looking over shoulder. Hold __5-10_ seconds. Repeat, turning to the left. Repeat __5_ times each way. Do _2__ times per day.

## 2020-12-10 DIAGNOSIS — I639 Cerebral infarction, unspecified: Secondary | ICD-10-CM | POA: Diagnosis not present

## 2020-12-11 DIAGNOSIS — I639 Cerebral infarction, unspecified: Secondary | ICD-10-CM | POA: Diagnosis not present

## 2020-12-12 DIAGNOSIS — I639 Cerebral infarction, unspecified: Secondary | ICD-10-CM | POA: Diagnosis not present

## 2020-12-14 ENCOUNTER — Encounter: Payer: Medicaid Other | Admitting: Occupational Therapy

## 2020-12-14 ENCOUNTER — Other Ambulatory Visit: Payer: Self-pay

## 2020-12-14 DIAGNOSIS — I639 Cerebral infarction, unspecified: Secondary | ICD-10-CM | POA: Diagnosis not present

## 2020-12-14 MED FILL — Carvedilol Tab 12.5 MG: ORAL | 90 days supply | Qty: 180 | Fill #0 | Status: AC

## 2020-12-14 MED FILL — Amlodipine Besylate Tab 10 MG (Base Equivalent): ORAL | 90 days supply | Qty: 90 | Fill #0 | Status: AC

## 2020-12-14 MED FILL — Atorvastatin Calcium Tab 80 MG (Base Equivalent): ORAL | 90 days supply | Qty: 90 | Fill #0 | Status: AC

## 2020-12-15 ENCOUNTER — Other Ambulatory Visit: Payer: Self-pay

## 2020-12-15 DIAGNOSIS — I639 Cerebral infarction, unspecified: Secondary | ICD-10-CM | POA: Diagnosis not present

## 2020-12-16 ENCOUNTER — Other Ambulatory Visit: Payer: Self-pay

## 2020-12-16 ENCOUNTER — Encounter: Payer: Self-pay | Admitting: Physical Therapy

## 2020-12-16 ENCOUNTER — Ambulatory Visit: Payer: Medicaid Other | Admitting: Occupational Therapy

## 2020-12-16 ENCOUNTER — Ambulatory Visit: Payer: Medicaid Other | Admitting: Physical Therapy

## 2020-12-16 DIAGNOSIS — I69351 Hemiplegia and hemiparesis following cerebral infarction affecting right dominant side: Secondary | ICD-10-CM | POA: Diagnosis not present

## 2020-12-16 DIAGNOSIS — I639 Cerebral infarction, unspecified: Secondary | ICD-10-CM | POA: Diagnosis not present

## 2020-12-16 DIAGNOSIS — M6281 Muscle weakness (generalized): Secondary | ICD-10-CM

## 2020-12-16 DIAGNOSIS — R2689 Other abnormalities of gait and mobility: Secondary | ICD-10-CM

## 2020-12-16 DIAGNOSIS — R2681 Unsteadiness on feet: Secondary | ICD-10-CM

## 2020-12-16 NOTE — Therapy (Signed)
[facility] [address] [place], [place], [address] Phone: [phone]   Fax:  [phone]  Physical Therapy Treatment  Patient Details  Name: Casey Petty MRN: [id number] Date of Birth: 06-13-1961 Referring Provider (PT): [name], NP   Encounter Date: 12/16/2020   PT End of Session - 12/16/20 0848    Visit Number 8    Number of Visits 9    Date for PT Re-Evaluation --   POC for 8 weeks   Authorization Type UHC Medicaid (VL: 27)    PT Start Time 0845    PT Stop Time 0930    PT Time Calculation (min) 45 min    Equipment Utilized During Treatment Gait belt    Activity Tolerance Patient tolerated treatment well    Behavior During Therapy [facility] for tasks assessed/performed;Restless           Past Medical History:  Diagnosis Date  . Hypertension     Past Surgical History:  Procedure Laterality Date  . NO PAST SURGERIES      There were no vitals filed for this visit.   Subjective Assessment - 12/16/20 0847    Subjective No new complaints. No falls. HEP is going well, as is going to [employer] as well 3x a week. No pain to report.    Patient is accompained by: Family member    Pertinent History HTN    Limitations Standing;Walking;House hold activities    Patient Stated Goals Get moving better.    Currently in Pain? No/denies    Pain Score 0-No pain                [facility] Adult PT Treatment/Exercise - 12/16/20 0848      Transfers   Transfers Sit to Stand;Stand to Sit    Sit to Stand 5: Supervision    Five time sit to stand comments  14.06 sec's no UE support from standard height chair. pt reports increasd fatigue after working in session. Will recheck at start of next session when pt not fatigued.    Stand to Sit 5: Supervision      Ambulation/Gait   Ambulation/Gait Yes    Ambulation/Gait Assistance 5: Supervision    Ambulation/Gait Assistance Details cues for increased right hip/knee flexion  with swing phase and decreased circumduction. Provided tactile cues/facilitation at pelvis for rotation/weight shifting with improvement noted.    Ambulation Distance (Feet) 230 Feet   x1, plus around gym   Assistive device Straight cane;Other (Comment)   with rubber quad tip, right AFO   Gait Pattern Step-through pattern;Decreased hip/knee flexion - right    Ambulation Surface Level;Indoor      High Level Balance   High Level Balance Comments --      Neuro Re-ed    Neuro Re-ed Details  for strengthening/NMR/balance: red band resisted right heel taps forward to 4 inch step with facilitaiton at pelvis, cues on posture with single UE support on bars fo ~15 reps; with right foot in stance on balance board in anterior/posterior direction for left foot stepping forward over board to floor/then backwards back over board with emphasis on right hip/knee flexion and push off to return for ~12 reps with single UE support on bars, min guard assist with facilitation; then on balance board in both directions with bil feet in stance on board, no UE support- holding the board steady for EC 30 sec's x 3 reps, progressing to EC head movements left<>right, up<>down for ~8-9 reps each.  min to mod assist with pt loosing balance posterior most, able to self correct at times with cues.      Knee/Hip Exercises: Aerobic   Other Aerobic [place] LE's only level 3.0 x 8 minutes with goal 85-95 steps per minute for strengthening, activity tolerance and working on hip/knee flexion/extension.                    PT Short Term Goals - 09/16/20 1049      PT SHORT TERM GOAL #1   Title Patient will be independent with initial HEP for strengthening/balance (All STGS due at 5th visit)    Baseline reports independence    Time 4    Period --   visits   Status Achieved      PT SHORT TERM GOAL #2   Title Patient will improve 5x sit <> stand to <12 seconds without UE support to demonstrate improved balance    Baseline  10.87 secs    Time 4    Period --   visits   Status Achieved      PT SHORT TERM GOAL #3   Title Patient will improve gait speed to >/= 2.3 ft/sec to demosntrate improved community ambulation    Baseline 2.04 ft/sec; 2.30 ft/sec    Time 4    Period --   visits   Status Achieved             PT Long Term Goals - 08/19/20 1209      PT LONG TERM GOAL #1   Title Patient will be independent with final HEP for maintaining functional gains and improved balance/strength (All LTGs due at 9th visit)    Baseline prior HEP provided    Time 8    Period --   visits   Status New      PT LONG TERM GOAL #2   Title Patient will improve Berg Balance Score from 47/56 to >/= 51/56 to demonstrate improved balance and reduce risk of falls    Baseline 47/56    Time 8    Period --   visits   Status New      PT LONG TERM GOAL #3   Title Patient will improve TUG to </= 10 seconds to demosntrate reduced fall risk    Baseline 13.25 secs    Time 8    Period --   visits   Status New      PT LONG TERM GOAL #4   Title Patient will improve gait speed to >/= 3.0 ft/sec to demonstrate improved community mobility    Baseline 2.04 ft/sec    Time 8    Period --   visits   Status New      PT LONG TERM GOAL #5   Title Patient will improve 5 sit <>stand to </=10 seconds for improve BLE strength and reduce risk for falls    Baseline 14.34 secs    Time 8    Period --   visits   Status New                 Plan - 12/16/20 0848    Clinical Impression Statement Today's skilled session continued to focus on LE strengthening, emphasis on right knee/hip flexion and balance training with no issues noted or reported. Began to check progress toward LTGs at end of session with decreased values noted in gait speed and 5 time sit to stand from last assessment. Pt stated it was due  to increased fatigue today. Will plan to recheck them at next session.    Personal Factors and Comorbidities Comorbidity 1;Time  since onset of injury/illness/exacerbation    Comorbidities HTN    Examination-Activity Limitations Squat;Stairs;Transfers;Locomotion Level;Lift;Reach Overhead    Examination-Participation Restrictions Community Activity    Stability/Clinical Decision Making Stable/Uncomplicated    Rehab Potential Fair    PT Frequency 1x / week    PT Duration 8 weeks   plus eval   PT Treatment/Interventions ADLs/Self Care Home Management;Cryotherapy;Electrical Stimulation;Moist Heat;DME Instruction;Gait training;Stair training;Functional mobility training;Therapeutic activities;Therapeutic exercise;Balance training;Neuromuscular re-education;Patient/family education;Orthotic Fit/Training;Manual techniques;Passive range of motion    PT Next Visit Plan check LTGs for anticipated discharge    Consulted and Agree with Plan of Care Patient           Patient will benefit from skilled therapeutic intervention in order to improve the following deficits and impairments:  Abnormal gait,Decreased balance,Decreased endurance,Difficulty walking,Increased muscle spasms,Impaired sensation,Impaired tone,Improper body mechanics,Pain,Postural dysfunction,Decreased strength,Decreased safety awareness,Decreased knowledge of use of DME,Decreased activity tolerance,Decreased coordination,Decreased range of motion  Visit Diagnosis: Other abnormalities of gait and mobility  Muscle weakness (generalized)  Unsteadiness on feet     Problem List Patient Active Problem List   Diagnosis Date Noted  . Chronic right shoulder pain 06/22/2020  . Adhesive capsulitis of right shoulder 10/07/2019  . Abnormality of gait 09/17/2019  . Spastic hemiparesis of right dominant side ([place]) 07/02/2019  . History of CVA with residual deficit 06/09/2019  . Spastic hemiparesis ([place])   . Uncontrolled type 2 diabetes mellitus with hyperglycemia ([place])   . Noncompliance   . Benign essential HTN   . Newly diagnosed diabetes ([place])   . Leucocytosis    . Right hemiparesis ([place])   . Basal ganglia stroke ([place]) 03/04/2019  . Stroke ([place]) 02/28/2019  . Essential hypertension 02/28/2019  . Tobacco abuse 02/28/2019  . Hyperglycemia 02/28/2019    Willow Ora, PTA, [facility] Outpatient Neuro [facility] [address] [place], [place] [address] [phone] 12/16/20, 10:12 AM   Name: Casey Petty MRN: [id number] Date of Birth: 26-Nov-1960

## 2020-12-16 NOTE — Therapy (Signed)
[facility] [address] [place], [place], [address] Phone: [phone]   Fax:  [phone]  Occupational Therapy Treatment  Patient Details  Name: Casey Petty MRN: [id number] Date of Birth: 1961/03/05 Referring Provider (OT): Dr Delice Lesch   Encounter Date: 12/16/2020   OT End of Session - 12/16/20 1006    Visit Number 9    Number of Visits 9    Authorization Type UHC MCD - 27 visits combined PT/OT/Speech    Authorization - Visit Number 9    Authorization - Number of Visits 9    OT Start Time 0800    OT Stop Time 0845    OT Time Calculation (min) 45 min    Activity Tolerance Patient tolerated treatment well    Behavior During Therapy [facility] for tasks assessed/performed;Restless           Past Medical History:  Diagnosis Date  . Hypertension     Past Surgical History:  Procedure Laterality Date  . NO PAST SURGERIES      There were no vitals filed for this visit.   Subjective Assessment - 12/16/20 0808    Subjective  Doing good    Pertinent History Lt BG stroke 02/28/19 w/ residual Rt dominant side hemiparesis, adhesive capsulitis of Rt shoulder, **RECENT botox injections on 08/13/20. PMH: HTN    Currently in Pain? No/denies            Reviewed last unmet goal - no further A/E needs.  AA/ROM in low range abduction supported on ball. Supine: P/ROM in shoulder flex, abd, elbow ext, wrist ext, thumb palmer abduction, and forearm supination. Pt shown trunk rotation/spinal twist in supine and performed x 3 reps each side holding 10 sec.  Seated: trunk rotation/spinal twist w/ arm stretch bilaterally. AA/ROM into gravity and gravity elim plane BUE's. Body on arm movements wt bearing over RUE w/ cross reaching LUE                       OT Short Term Goals - 09/30/20 1124      OT SHORT TERM GOAL #1   Title Independent with HEP for RUE    Baseline Has prior program, needs updated as patient  moves quickly and is developing pain    Time 4    Period Weeks    Status Achieved    Target Date 10/03/20      OT SHORT TERM GOAL #2   Title Independent with resting hand splint for pm wear    Baseline dependent, no longer wearing    Time 4    Period Weeks    Status Achieved      OT SHORT TERM GOAL #3   Title Decrease pain with active elbow extension by 2 points (10 point scale)    Baseline 5/10    Time 4    Period Weeks    Status Achieved             OT Long Term Goals - 12/16/20 1006      OT LONG TERM GOAL #1   Title Pt to verbalize understanding of AE needs to increase independence and safety with ADLS (including walker splint, shoe buttons, one handed cutting board)    Baseline mod assist    Time 8    Period Weeks    Status Achieved   discussed and issued handouts on 09/16/20     OT LONG TERM GOAL #2   Title  Pt to report shoulder pain less than or equal to 5/10 with P/ROM to 130*    Baseline 115 flex, and 105 abd    Time 8    Period Weeks    Status Achieved      OT LONG TERM GOAL #3   Title Patient will tolerate splinting schedule for hand and or elbow orthosis    Baseline inconsistent    Time 8    Period Weeks    Status Achieved      OT LONG TERM GOAL #4   Title Patient will don doff shirt with modified independence    Baseline min assist    Time 8    Period Weeks    Status Achieved   per pt/wife report                Plan - 12/16/20 1007    Clinical Impression Statement Pt has met all goals at this time.    OT Occupational Profile and History Detailed Assessment- Review of Records and additional review of physical, cognitive, psychosocial history related to current functional performance    Occupational performance deficits (Please refer to evaluation for details): ADL's;IADL's;Rest and Sleep;Social Participation    Body Structure / Function / Physical Skills ADL;ROM;Balance;IADL;Improper spinal/pelvic alignment;Body  mechanics;Sensation;Mobility;Flexibility;Strength;Coordination;Tone;UE functional use;Pain;Decreased knowledge of use of DME;Proprioception;FMC    Rehab Potential Good    Clinical Decision Making Several treatment options, min-mod task modification necessary    OT Frequency 1x / week    OT Duration 8 weeks    OT Treatment/Interventions Self-care/ADL training;Therapeutic exercise;Functional Mobility Training;Aquatic Therapy;Ultrasound;Neuromuscular education;Manual Therapy;Splinting;Therapeutic activities;Coping strategies training;DME and/or AE instruction;Cognitive remediation/compensation;Electrical Stimulation;Moist Heat;Passive range of motion;Patient/family education;Balance training    Plan D/C O.Donnajean Lopes and Agree with Plan of Care Patient;Family member/caregiver    Family Member Consulted Casey Petty           Patient will benefit from skilled therapeutic intervention in order to improve the following deficits and impairments:   Body Structure / Function / Physical Skills: ADL,ROM,Balance,IADL,Improper spinal/pelvic alignment,Body mechanics,Sensation,Mobility,Flexibility,Strength,Coordination,Tone,UE functional use,Pain,Decreased knowledge of use of DME,Proprioception,FMC       Visit Diagnosis: Hemiplegia and hemiparesis following cerebral infarction affecting right dominant side (HCC)  Muscle weakness (generalized)    Problem List Patient Active Problem List   Diagnosis Date Noted  . Chronic right shoulder pain 06/22/2020  . Adhesive capsulitis of right shoulder 10/07/2019  . Abnormality of gait 09/17/2019  . Spastic hemiparesis of right dominant side ([place]) 07/02/2019  . History of CVA with residual deficit 06/09/2019  . Spastic hemiparesis ([place])   . Uncontrolled type 2 diabetes mellitus with hyperglycemia ([place])   . Noncompliance   . Benign essential HTN   . Newly diagnosed diabetes ([place])   . Leucocytosis   . Right hemiparesis ([place])   . Basal ganglia  stroke ([place]) 03/04/2019  . Stroke ([place]) 02/28/2019  . Essential hypertension 02/28/2019  . Tobacco abuse 02/28/2019  . Hyperglycemia 02/28/2019    OCCUPATIONAL THERAPY DISCHARGE SUMMARY  Visits from Start of Care: 9  Current functional level related to goals / functional outcomes: See above   Remaining deficits: Rt spastic hemiplegia   Education / Equipment: HEP's for stretches and AA/ROM RUE/BUE's, A/E recommendations  Plan: Patient agrees to discharge.  Patient goals were met. Patient is being discharged due to meeting the stated rehab goals.  ?????       Carey Bullocks, OTR/L 12/16/2020, 10:08 AM  [facility] [address]  [place], [place], [address] Phone: [phone]   Fax:  [phone]  Name: Casey Petty MRN: [id number] Date of Birth: May 02, 1961

## 2020-12-17 DIAGNOSIS — I639 Cerebral infarction, unspecified: Secondary | ICD-10-CM | POA: Diagnosis not present

## 2020-12-18 DIAGNOSIS — I639 Cerebral infarction, unspecified: Secondary | ICD-10-CM | POA: Diagnosis not present

## 2020-12-19 DIAGNOSIS — I639 Cerebral infarction, unspecified: Secondary | ICD-10-CM | POA: Diagnosis not present

## 2020-12-21 DIAGNOSIS — I639 Cerebral infarction, unspecified: Secondary | ICD-10-CM | POA: Diagnosis not present

## 2020-12-22 DIAGNOSIS — I639 Cerebral infarction, unspecified: Secondary | ICD-10-CM | POA: Diagnosis not present

## 2020-12-23 ENCOUNTER — Ambulatory Visit: Payer: Medicaid Other | Admitting: Physical Therapy

## 2020-12-23 ENCOUNTER — Other Ambulatory Visit: Payer: Self-pay

## 2020-12-23 ENCOUNTER — Encounter: Payer: Self-pay | Admitting: Physical Therapy

## 2020-12-23 DIAGNOSIS — R2681 Unsteadiness on feet: Secondary | ICD-10-CM

## 2020-12-23 DIAGNOSIS — I639 Cerebral infarction, unspecified: Secondary | ICD-10-CM | POA: Diagnosis not present

## 2020-12-23 DIAGNOSIS — R2689 Other abnormalities of gait and mobility: Secondary | ICD-10-CM

## 2020-12-23 DIAGNOSIS — I69351 Hemiplegia and hemiparesis following cerebral infarction affecting right dominant side: Secondary | ICD-10-CM | POA: Diagnosis not present

## 2020-12-23 DIAGNOSIS — M6281 Muscle weakness (generalized): Secondary | ICD-10-CM

## 2020-12-23 NOTE — Therapy (Addendum)
[facility] [address] [place], [place], [address] Phone: [phone]   Fax:  [phone]  Physical Therapy Treatment/Discharge Summary  Patient Details  Name: Casey Petty MRN: [id number] Date of Birth: [birth year] Referring Provider (PT): [name], NP  PHYSICAL THERAPY DISCHARGE SUMMARY  Visits from Start of Care: 9  Current functional level related to goals / functional outcomes: See Clinical Impression Statement   Remaining deficits: Abnormal Gait, Medium Fall Risk   Education / Equipment: HEP provided  Plan: Patient agrees to discharge.  Patient goals were partially met. Patient is being discharged due to being pleased with the current functional level.  ?????        Encounter Date: 12/23/2020   PT End of Session - 12/23/20 0849    Visit Number 9    Number of Visits 9    Date for PT Re-Evaluation --   POC for 8 weeks   Authorization Type UHC Medicaid (VL: 27)    PT Start Time 0846    PT Stop Time 0926    PT Time Calculation (min) 40 min    Equipment Utilized During Treatment Gait belt    Activity Tolerance Patient tolerated treatment well    Behavior During Therapy WFL for tasks assessed/performed;Restless           Past Medical History:  Diagnosis Date  . Hypertension     Past Surgical History:  Procedure Laterality Date  . NO PAST SURGERIES      There were no vitals filed for this visit.   Subjective Assessment - 12/23/20 0849    Subjective No new complatins. No falls or pain to report.    Pertinent History HTN    Limitations Standing;Walking;House hold activities    Patient Stated Goals Get moving better.    Currently in Pain? No/denies    Pain Score 0-No pain              [facility] PT Assessment - 12/23/20 0850      Berg Balance Test   Sit to Stand Able to stand without using hands and stabilize independently    Standing Unsupported Able to stand safely 2 minutes     Sitting with Back Unsupported but Feet Supported on Floor or Stool Able to sit safely and securely 2 minutes    Stand to Sit Sits safely with minimal use of hands    Transfers Able to transfer safely, minor use of hands    Standing Unsupported with Eyes Closed Able to stand 10 seconds safely    Standing Unsupported with Feet Together Able to place feet together independently and stand 1 minute safely    From Standing, Reach Forward with Outstretched Arm Can reach confidently >25 cm (10")    From Standing Position, Pick up Object from Floor Able to pick up shoe safely and easily    From Standing Position, Turn to Look Behind Over each Shoulder Looks behind one side only/other side shows less weight shift   left>right   Turn 360 Degrees Able to turn 360 degrees safely one side only in 4 seconds or less   4 sec's toward right, 6 sec's toward left   Standing Unsupported, Alternately Place Feet on Step/Stool Able to stand independently and complete 8 steps >20 seconds   22.50 sec's   Standing Unsupported, One Foot in Front Able to plae foot ahead of the other independently and hold 30 seconds    Standing on One Leg Able to lift  leg independently and hold 5-10 seconds    Total Score 51      Timed Up and Go Test   TUG Normal TUG    Normal TUG (seconds) 14.62   with cane/AFO                [facility] Adult PT Treatment/Exercise - 12/23/20 0850      Transfers   Transfers Sit to Stand;Stand to Sit    Sit to Stand 5: Supervision    Five time sit to stand comments  11.90 sec's with no UE support from standard height surface.    Stand to Sit 5: Supervision      Ambulation/Gait   Ambulation/Gait Yes    Ambulation/Gait Assistance 5: Supervision    Ambulation/Gait Assistance Details around clinic with session    Assistive device Straight cane;Other (Comment)    Gait Pattern Step-through pattern;Decreased hip/knee flexion - right   right AFO, cane with rubber quad tip   Ambulation Surface  Level;Indoor    Gait velocity 17.38 sec's=  ft/sec with cane      Self-Care   Self-Care Other Self-Care Comments    Other Self-Care Comments  HEP reviewed. Issued new handout as pt can not recall where his is, however he does report doing them. Also issued black theraband as he is using blue which he says is getting easy as well.                 PT Short Term Goals - 09/16/20 1049      PT SHORT TERM GOAL #1   Title Patient will be independent with initial HEP for strengthening/balance (All STGS due at 5th visit)    Baseline reports independence    Time 4    Period --   visits   Status Achieved      PT SHORT TERM GOAL #2   Title Patient will improve 5x sit <> stand to <12 seconds without UE support to demonstrate improved balance    Baseline 10.87 secs    Time 4    Period --   visits   Status Achieved      PT SHORT TERM GOAL #3   Title Patient will improve gait speed to >/= 2.3 ft/sec to demosntrate improved community ambulation    Baseline 2.04 ft/sec; 2.30 ft/sec    Time 4    Period --   visits   Status Achieved             PT Long Term Goals - 12/23/20 1050      PT LONG TERM GOAL #1   Title Patient will be independent with final HEP for maintaining functional gains and improved balance/strength (All LTGs due at 9th visit)    Baseline 12/23/20: met with current HEP. Provided black theraband to progress too as he is currently using blue one.    Period --   visits   Status Achieved      PT LONG TERM GOAL #2   Title Patient will improve Berg Balance Score from 47/56 to >/= 51/56 to demonstrate improved balance and reduce risk of falls    Baseline 12/23/20: 51/56 scored today    Period --   visits   Status Achieved      PT LONG TERM GOAL #3   Title Patient will improve TUG to </= 10 seconds to demosntrate reduced fall risk    Baseline 12/23/20: 14.62 sec's with cane, increased from last assessment time of 13.25 sec's with cane  Period --   visits   Status Not  Met      PT LONG TERM GOAL #4   Title Patient will improve gait speed to >/= 3.0 ft/sec to demonstrate improved community mobility    Baseline 12/23/20: 1.89 ft/sec with cane, decreaed distance from last assessment of 2.04 ft/sec.    Period --   visits   Status Not Met      PT LONG TERM GOAL #5   Title Patient will improve 5 sit <>stand to </=10 seconds for improve BLE strength and reduce risk for falls    Baseline 12/23/20: 11.90 sec's no UE support from standard height surface, improved from 14.34 sec's just not to goal    Period --   visits   Status Partially Met                 Plan - 12/23/20 0849    Clinical Impression Statement Today's skilled session focused on progress toward LTGs for anticipated discharge. Pt has met his HEP goal, Berg Balance Test goal with score of 51/56 and partially met his 5 time sit to stand goal with time of 11.90 sec's no UE support. This time was improved, just not to goal level. The pt did not meet his TUG goal or his 10 meter gait speed goals. His TUG time was increased today from last assessment and his 10 meter gait speed distance was decreased from last assessment. The pt is agreeable to discharge today.    Personal Factors and Comorbidities Comorbidity 1;Time since onset of injury/illness/exacerbation    Comorbidities HTN    Examination-Activity Limitations Squat;Stairs;Transfers;Locomotion Level;Lift;Reach Overhead    Examination-Participation Restrictions Community Activity    Stability/Clinical Decision Making Stable/Uncomplicated    Rehab Potential Fair    PT Frequency 1x / week    PT Duration 8 weeks   plus eval   PT Treatment/Interventions ADLs/Self Care Home Management;Cryotherapy;Electrical Stimulation;Moist Heat;DME Instruction;Gait training;Stair training;Functional mobility training;Therapeutic activities;Therapeutic exercise;Balance training;Neuromuscular re-education;Patient/family education;Orthotic Fit/Training;Manual  techniques;Passive range of motion    PT Next Visit Plan discharge per PT plan of care    Consulted and Agree with Plan of Care Patient           Patient will benefit from skilled therapeutic intervention in order to improve the following deficits and impairments:  Abnormal gait,Decreased balance,Decreased endurance,Difficulty walking,Increased muscle spasms,Impaired sensation,Impaired tone,Improper body mechanics,Pain,Postural dysfunction,Decreased strength,Decreased safety awareness,Decreased knowledge of use of DME,Decreased activity tolerance,Decreased coordination,Decreased range of motion  Visit Diagnosis: Other abnormalities of gait and mobility  Muscle weakness (generalized)  Unsteadiness on feet     Problem List Patient Active Problem List   Diagnosis Date Noted  . Chronic right shoulder pain 06/22/2020  . Adhesive capsulitis of right shoulder 10/07/2019  . Abnormality of gait 09/17/2019  . Spastic hemiparesis of right dominant side ([place]) 07/02/2019  . History of CVA with residual deficit 06/09/2019  . Spastic hemiparesis ([place])   . Uncontrolled type 2 diabetes mellitus with hyperglycemia ([place])   . Noncompliance   . Benign essential HTN   . Newly diagnosed diabetes ([place])   . Leucocytosis   . Right hemiparesis ([place])   . Basal ganglia stroke ([place]) 03/04/2019  . Stroke ([place]) 02/28/2019  . Essential hypertension 02/28/2019  . Tobacco abuse 02/28/2019  . Hyperglycemia 02/28/2019    Willow Ora, PTA, [facility] Outpatient Neuro [facility] [address], [place], [place] [address] [phone] 12/23/20, 10:57 AM   Name: Ryota Treece MRN: [id number] Date of Birth: October 26, 1960  Addendum by Primary PT: Kaitlyn B. Hassell Done PT, [place] [address] [place], [place]  [address] Phone:  [phone] Fax:  [phone]

## 2020-12-23 NOTE — Patient Instructions (Signed)
Access Code: [email or web address] URL: [email or web address] Date: 12/23/2020 Prepared by: Willow Ora  Exercises Supine Bridge with Resistance Band - 1 x daily - 5 x weekly - 10 reps - 2 sets Hooklying Single Leg Bent Knee Fallouts with Resistance - 1 x daily - 5 x weekly - 2 sets - 10 reps Seated Ankle Plantarflexion with Resistance - 1 x daily - 5 x weekly - 1 sets - 10 reps Seated Hamstring Stretch - 1 x daily - 5 x weekly - 1 sets - 3 reps - 30 hold Sit to/from Stand in Stride position - 1 x daily - 5 x weekly - 1 sets - 10 reps Standing March with Unilateral Counter Support - 1 x daily - 5 x weekly - 1 sets - 10 reps Mini Squat - 1 x daily - 5 x weekly - 1 sets - 10 reps Side Stepping with Counter Support - 1 x daily - 7 x weekly - 10 reps - 3 sets Backward Walking with Counter Support - 1 x daily - 7 x weekly - 10 reps - 3 sets Standing Gastroc Stretch - 2 x daily - 7 x weekly - 1 sets - 3 reps - 30 sec hold

## 2020-12-24 DIAGNOSIS — I639 Cerebral infarction, unspecified: Secondary | ICD-10-CM | POA: Diagnosis not present

## 2020-12-25 DIAGNOSIS — I639 Cerebral infarction, unspecified: Secondary | ICD-10-CM | POA: Diagnosis not present

## 2020-12-26 DIAGNOSIS — I639 Cerebral infarction, unspecified: Secondary | ICD-10-CM | POA: Diagnosis not present

## 2020-12-28 DIAGNOSIS — I639 Cerebral infarction, unspecified: Secondary | ICD-10-CM | POA: Diagnosis not present

## 2020-12-29 DIAGNOSIS — I639 Cerebral infarction, unspecified: Secondary | ICD-10-CM | POA: Diagnosis not present

## 2020-12-30 DIAGNOSIS — I639 Cerebral infarction, unspecified: Secondary | ICD-10-CM | POA: Diagnosis not present

## 2020-12-31 ENCOUNTER — Other Ambulatory Visit: Payer: Self-pay

## 2020-12-31 ENCOUNTER — Encounter: Payer: Self-pay | Admitting: Physical Medicine & Rehabilitation

## 2020-12-31 ENCOUNTER — Encounter: Payer: Medicaid Other | Attending: Registered Nurse | Admitting: Physical Medicine & Rehabilitation

## 2020-12-31 VITALS — BP 143/83 | HR 74 | Temp 98.4°F | Ht 67.0 in | Wt 159.0 lb

## 2020-12-31 DIAGNOSIS — Z720 Tobacco use: Secondary | ICD-10-CM | POA: Diagnosis not present

## 2020-12-31 DIAGNOSIS — M7501 Adhesive capsulitis of right shoulder: Secondary | ICD-10-CM | POA: Diagnosis not present

## 2020-12-31 DIAGNOSIS — I6381 Other cerebral infarction due to occlusion or stenosis of small artery: Secondary | ICD-10-CM | POA: Diagnosis not present

## 2020-12-31 DIAGNOSIS — G8110 Spastic hemiplegia affecting unspecified side: Secondary | ICD-10-CM | POA: Insufficient documentation

## 2020-12-31 DIAGNOSIS — R269 Unspecified abnormalities of gait and mobility: Secondary | ICD-10-CM | POA: Diagnosis not present

## 2020-12-31 DIAGNOSIS — I639 Cerebral infarction, unspecified: Secondary | ICD-10-CM | POA: Diagnosis not present

## 2020-12-31 DIAGNOSIS — G8111 Spastic hemiplegia affecting right dominant side: Secondary | ICD-10-CM | POA: Diagnosis not present

## 2020-12-31 MED ORDER — BACLOFEN 20 MG PO TABS
ORAL_TABLET | ORAL | 3 refills | Status: DC
  Filled 2020-12-31 – 2021-01-28 (×2): qty 90, 20d supply, fill #0

## 2020-12-31 NOTE — Progress Notes (Addendum)
Subjective:    Patient ID: Casey Petty, male    DOB: 1960-08-13, 60 y.o.   MRN: [id number]  HPI Male with history of HTN--no medicine 3 years presents for follow up for left basal ganglia stroke.  Last clinic visit on 11/24/20.  He had Botulinum toxin injection at that time, prior to that he had an intraarticular steroid injection. Since that time, pt states he is still in therapies. Denies falls. He notes improvement in tone with injection. He states his AFO does not fit his new shoes. Continues to smoke 1 cig/day. He notes very good benefit with intraarticular injeciton  Pain Inventory Average Pain 7 Pain Right Now 9 My pain is intermittent, sharp, stabbing and in the right shoulder. Right side of body shakes at night. My right leg shakes and hurts at night.   In the last 24 hours, has pain interfered with the following? General activity 6 Relation with others 0 Enjoyment of life 4 What TIME of day is your pain at its worst? morning, daytime, evening, night, anytime.  Sleep (in general) Good  Pain is worse with: some activites and When I shake at night. Pain improves with: rest Relief from Meds: no pain meds  Family History  Problem Relation Age of Onset  . Heart attack Father   . Cancer Neg Hx    Social History   Socioeconomic History  . Marital status: Single    Spouse name: Not on file  . Number of children: Not on file  . Years of education: Not on file  . Highest education level: Not on file  Occupational History  . Not on file  Tobacco Use  . Smoking status: Current Every Day Smoker  . Smokeless tobacco: Never Used  . Tobacco comment: Smoking 4 cigs/day  Vaping Use  . Vaping Use: Never used  Substance and Sexual Activity  . Alcohol use: Yes    Alcohol/week: 2.0 standard drinks    Types: 2 Standard drinks or equivalent per week  . Drug use: No  . Sexual activity: Never  Other Topics Concern  . Not on file  Social History Narrative  . Not on file    Social Determinants of Health   Financial Resource Strain: Not on file  Food Insecurity: Not on file  Transportation Needs: Not on file  Physical Activity: Not on file  Stress: Not on file  Social Connections: Not on file   Past Surgical History:  Procedure Laterality Date  . NO PAST SURGERIES     Past Medical History:  Diagnosis Date  . Hypertension    BP (!) 143/83   Pulse 74   Temp 98.4 F (36.9 C)   Ht 5\' 7"  (1.702 m)   Wt 159 lb (72.1 kg)   SpO2 96%   BMI 24.90 kg/m   Opioid Risk Score:   Fall Risk Score:  `1  Depression screen PHQ 2/9  Depression screen [facility] 2/9 11/24/2020 11/02/2020 09/24/2020 09/01/2020 07/16/2020 06/22/2020 02/06/2020  Decreased Interest 0 0 0 1 0 0 0  Down, Depressed, Hopeless 0 0 0 0 0 0 0  PHQ - 2 Score 0 0 0 1 0 0 0  Altered sleeping - - - 0 - - -  Tired, decreased energy - - - 0 - - -  Change in appetite - - - 0 - - -  Feeling bad or failure about yourself  - - - 0 - - -  Trouble concentrating - - - 0 - - -  Moving slowly or fidgety/restless - - - 0 - - -  Suicidal thoughts - - - 0 - - -  PHQ-9 Score - - - 1 - - -   Review of Systems  Constitutional: Negative.   HENT: Negative.   Eyes: Negative.   Respiratory: Negative.   Cardiovascular: Negative.   Gastrointestinal: Negative.   Endocrine: Negative.   Genitourinary: Negative.   Musculoskeletal: Positive for arthralgias and gait problem.       Spasms/ shoulder pain on stroke affected side. Also right leg has pain & shakes at night.   Skin: Negative.   Allergic/Immunologic: Negative.   Neurological: Positive for tremors and weakness.  Hematological: Negative.   Psychiatric/Behavioral: Negative.       Objective:   Physical Exam  Constitutional: No distress . Vital signs reviewed. HENT: Normocephalic.  Atraumatic. Eyes: EOMI. No discharge. Cardiovascular: No JVD.   Respiratory: Normal effort.  No stridor.   GI: Non-distended.   Skin: Warm and dry.  Intact. Psych: Normal  mood.  Normal behavior. Right shoulder with limitation in ER, shoulder abduction improvement Gait: Hemiparetic, stable Neurologic: Alert Dysarthria, unchanged Motor:  RUE: Shoulder abduction 3/5 (pain inhibition), elbow flexion 2+/5, elbow extension 4-/5, handgrip 2/5 with apraxia Right lower extremity: Hip flexion, knee extension 4/5, ankle dorsiflexion 0/5 MAS: right shoulder abdcutors 1/4, elbow flexors 1+/4, wrist flexors 2/4, finger flexors 2/4, ankle dorsiflexors 0/4    Assessment & Plan:  Male with history of HTN--no medicine 3 years presents for follow up for left basal ganglia stroke.  1. Right hemiparesis, now with spasticity and functional deficitssecondary to left subcortical lacunar infarct.             Continue therapies             Continue Baclofen 30 TID             Follow up with Neurology, needs appointment, reminded again x6 - information provided             Continue dysport:                         Right    Mec Major:      50 units                                                                          Biceps:            150    Brachioradialis 100                                     FCR                 350                                     FCU                 250  FDS                 200                                     FDP                 100                          Right    Gastroc Med   50                                     Lastroc Lat      50  He would like to focus on RUE   2. Gait abnormality             Continue therapies             Continue ambulation with AFO, encouraged new foot ware given plantar shoe breakdown  3. Tobacco abuse             Now 1/day, encouraged abstinence x9  4. Adhesive capsulitis             Good benefit with injection on 3/28             Continue Voltaren gel             Xray of shoulder, reviewed, unremarkable

## 2020-12-31 NOTE — Addendum Note (Signed)
Addended by: Delice Lesch A on: 12/31/2020 10:25 AM   Modules accepted: Orders

## 2021-01-01 DIAGNOSIS — I639 Cerebral infarction, unspecified: Secondary | ICD-10-CM | POA: Diagnosis not present

## 2021-01-02 DIAGNOSIS — I639 Cerebral infarction, unspecified: Secondary | ICD-10-CM | POA: Diagnosis not present

## 2021-01-04 DIAGNOSIS — I639 Cerebral infarction, unspecified: Secondary | ICD-10-CM | POA: Diagnosis not present

## 2021-01-05 DIAGNOSIS — I639 Cerebral infarction, unspecified: Secondary | ICD-10-CM | POA: Diagnosis not present

## 2021-01-06 DIAGNOSIS — I639 Cerebral infarction, unspecified: Secondary | ICD-10-CM | POA: Diagnosis not present

## 2021-01-07 ENCOUNTER — Other Ambulatory Visit: Payer: Self-pay

## 2021-01-07 DIAGNOSIS — I639 Cerebral infarction, unspecified: Secondary | ICD-10-CM | POA: Diagnosis not present

## 2021-01-08 DIAGNOSIS — I639 Cerebral infarction, unspecified: Secondary | ICD-10-CM | POA: Diagnosis not present

## 2021-01-09 DIAGNOSIS — I639 Cerebral infarction, unspecified: Secondary | ICD-10-CM | POA: Diagnosis not present

## 2021-01-11 ENCOUNTER — Ambulatory Visit: Payer: Medicaid Other | Admitting: Adult Health

## 2021-01-11 DIAGNOSIS — I639 Cerebral infarction, unspecified: Secondary | ICD-10-CM | POA: Diagnosis not present

## 2021-01-12 DIAGNOSIS — I639 Cerebral infarction, unspecified: Secondary | ICD-10-CM | POA: Diagnosis not present

## 2021-01-13 DIAGNOSIS — I639 Cerebral infarction, unspecified: Secondary | ICD-10-CM | POA: Diagnosis not present

## 2021-01-14 DIAGNOSIS — I639 Cerebral infarction, unspecified: Secondary | ICD-10-CM | POA: Diagnosis not present

## 2021-01-15 DIAGNOSIS — I639 Cerebral infarction, unspecified: Secondary | ICD-10-CM | POA: Diagnosis not present

## 2021-01-16 DIAGNOSIS — I639 Cerebral infarction, unspecified: Secondary | ICD-10-CM | POA: Diagnosis not present

## 2021-01-18 DIAGNOSIS — I639 Cerebral infarction, unspecified: Secondary | ICD-10-CM | POA: Diagnosis not present

## 2021-01-19 ENCOUNTER — Other Ambulatory Visit: Payer: Self-pay

## 2021-01-19 ENCOUNTER — Ambulatory Visit: Payer: Medicaid Other | Attending: Family Medicine | Admitting: Family Medicine

## 2021-01-19 ENCOUNTER — Encounter: Payer: Self-pay | Admitting: Family Medicine

## 2021-01-19 ENCOUNTER — Ambulatory Visit: Payer: Medicaid Other | Admitting: Adult Health

## 2021-01-19 DIAGNOSIS — I6930 Unspecified sequelae of cerebral infarction: Secondary | ICD-10-CM | POA: Diagnosis not present

## 2021-01-19 DIAGNOSIS — I10 Essential (primary) hypertension: Secondary | ICD-10-CM | POA: Diagnosis not present

## 2021-01-19 DIAGNOSIS — I639 Cerebral infarction, unspecified: Secondary | ICD-10-CM | POA: Diagnosis not present

## 2021-01-19 DIAGNOSIS — E1165 Type 2 diabetes mellitus with hyperglycemia: Secondary | ICD-10-CM | POA: Diagnosis not present

## 2021-01-19 MED ORDER — CARVEDILOL 12.5 MG PO TABS
ORAL_TABLET | ORAL | 1 refills | Status: DC
  Filled 2021-01-19: qty 180, fill #0
  Filled 2021-01-28: qty 180, 90d supply, fill #0

## 2021-01-19 MED ORDER — AMLODIPINE BESYLATE 10 MG PO TABS
ORAL_TABLET | Freq: Every day | ORAL | 1 refills | Status: DC
  Filled 2021-01-19: qty 90, fill #0
  Filled 2021-01-28: qty 90, 90d supply, fill #0

## 2021-01-19 MED ORDER — ATORVASTATIN CALCIUM 80 MG PO TABS
ORAL_TABLET | ORAL | 1 refills | Status: DC
  Filled 2021-01-19 – 2021-01-28 (×2): qty 90, 90d supply, fill #0

## 2021-01-19 NOTE — Progress Notes (Signed)
Virtual Visit via Telephone Note  I connected with Casey Petty, on 01/19/2021 at 10:32 AM by telephone due to the COVID-19 pandemic and verified that I am speaking with the correct person using two identifiers.   Consent: I discussed the limitations, risks, security and privacy concerns of performing an evaluation and management service by telephone and the availability of in person appointments. I also discussed with the patient that there may be a patient responsible charge related to this service. The patient expressed understanding and agreed to proceed.   Location of Patient: Home  Location of Provider: Clinic   Persons participating in Telemedicine visit: [name] Dr. Margarita Rana     History of Present Illness: Casey Petty is a 60 year old male with a history of type 2 diabetes mellitus (A1c 14.6) stroke (with right spastic hemiparesis s/p Botox injections by rehab), right adhesive capsulitis, dysarthria  Sugars have been 120, 108. Denies hypoglycemia.  He endorses compliance with Lantus but does not recall being on Farxiga.  His fiance assists up his medications from the pharmacy he does not visual concerns, numbness in extremities.  Endorses compliance with his antihypertensive and statin.  Does not check his blood pressures. Closely followed by Rehab Medicine with His Last Office Visit on 12/31/20.  Last injection for adhesive capsulitis was in 10/2020.  He uses a right AFO brace. He has no additional concerns today.  Past Medical History:  Diagnosis Date   Hypertension    Allergies  Allergen Reactions   Lisinopril Swelling    Angioedema- lips/face    Current Outpatient Medications on File Prior to Visit  Medication Sig Dispense Refill   acetaminophen (TYLENOL) 325 MG tablet Take 1-2 tablets (325-650 mg total) by mouth every 4 (four) hours as needed for mild pain.     amLODipine (NORVASC) 10 MG tablet Take 1 tablet (10 mg total) by mouth daily. To lower  blood pressure 90 tablet 1   amLODipine (NORVASC) 10 MG tablet TAKE 1 TABLET (10 MG TOTAL) BY MOUTH DAILY. TO LOWER BLOOD PRESSURE 90 tablet 1   amLODipine (NORVASC) 10 MG tablet TAKE 1 TABLET (10 MG TOTAL) BY MOUTH DAILY. TO LOWER BLOOD PRESSURE (Patient taking differently: Take by mouth daily. to lower blood pressure) 45 tablet 0   aspirin 81 MG EC tablet Take 1 tablet (81 mg total) by mouth daily. 100 tablet 0   atorvastatin (LIPITOR) 80 MG tablet TAKE 1 TABLET BY MOUTH IN THE EVENINGS TO LOWER CHOLESTEROL 90 tablet 1   atorvastatin (LIPITOR) 80 MG tablet TAKE 1 TABLET BY MOUTH IN THE EVENINGS TO LOWER CHOLESTEROL 90 tablet 1   atorvastatin (LIPITOR) 80 MG tablet TAKE 1 TABLET BY MOUTH IN THE EVENINGS TO LOWER CHOLESTEROL 45 tablet 0   baclofen (LIORESAL) 20 MG tablet TAKE 1.5 TABLETS (30 MG TOTAL) BY MOUTH 3 (THREE) TIMES DAILY. 90 tablet 3   Blood Glucose Monitoring Suppl (TRUE METRIX METER) w/Device KIT Check blood sugar fasting and before meals and again if pt feels bad (symptoms of hypo). 1 kit 0   carvedilol (COREG) 12.5 MG tablet Take 1 tablet (12.5 mg total) by mouth 2 (two) times daily with a meal. For blood pressure 180 tablet 1   carvedilol (COREG) 12.5 MG tablet TAKE 1 TABLET (12.5 MG TOTAL) BY MOUTH 2 (TWO) TIMES DAILY WITH A MEAL. FOR BLOOD PRESSURE 180 tablet 1   clotrimazole (LOTRIMIN) 1 % cream Apply to affected area 2 times daily for the next 7 days. 15 g 0  dapagliflozin propanediol (FARXIGA) 10 MG TABS tablet Take 1 tablet (10 mg total) by mouth daily before breakfast. 90 tablet 1   dapagliflozin propanediol (FARXIGA) 10 MG TABS tablet TAKE 1 TABLET (10 MG TOTAL) BY MOUTH DAILY BEFORE BREAKFAST. 90 tablet 1   diclofenac Sodium (VOLTAREN) 1 % GEL Apply 2 g topically 4 (four) times daily. 350 g 1   ezetimibe (ZETIA) 10 MG tablet Take 1 tablet (10 mg total) by mouth daily. 90 tablet 1   ezetimibe (ZETIA) 10 MG tablet TAKE 1 TABLET (10 MG TOTAL) BY MOUTH DAILY. 90 tablet 1    ezetimibe (ZETIA) 10 MG tablet TAKE 1 TABLET (10 MG TOTAL) BY MOUTH DAILY. 90 tablet 3   famotidine (PEPCID) 20 MG tablet Take 1 tablet (20 mg total) by mouth daily. 30 tablet 0   glucose blood (TRUE METRIX BLOOD GLUCOSE TEST) test strip Use as instructed 100 each 12   hydrALAZINE (APRESOLINE) 100 MG tablet Take 1 tablet (100 mg total) by mouth every 8 (eight) hours. To lower blood pressure 270 tablet 1   hydrALAZINE (APRESOLINE) 100 MG tablet TAKE 1 TABLET (100 MG TOTAL) BY MOUTH EVERY 8 (EIGHT) HOURS. TO LOWER BLOOD PRESSURE 270 tablet 1   hydrALAZINE (APRESOLINE) 100 MG tablet TAKE 1 TABLET (100 MG TOTAL) BY MOUTH EVERY 8 (EIGHT) HOURS. TO LOWER BLOOD PRESSURE 120 tablet 0   insulin glargine (LANTUS SOLOSTAR) 100 UNIT/ML Solostar Pen Inject 10 Units into the skin daily. 30 mL 6   insulin glargine (LANTUS) 100 UNIT/ML Solostar Pen INJECT 10 UNITS INTO THE SKIN DAILY. 30 mL 6   Misc. Devices MISC Wheelchair with Accessories: elevating leg rests (ELRs), wheel locks, extensions and anti-tippers. Cane or walker will not suffice 1 each 0   TRUEplus Lancets 28G MISC Check blood sugar fasting and before meals and again if pt feels bad (symptoms of hypo). 100 each 12   No current facility-administered medications on file prior to visit.    ROS: See HPI  Observations/Objective: Awake, alert, oriented x3 Not in acute distress Normal mood   CMP Latest Ref Rng & Units 09/01/2020 07/24/2019 05/08/2019  Glucose 65 - 99 mg/dL 389(H) 152(H) 134(H)  BUN 6 - 24 mg/dL $Remove'11 8 7  'iGuubkQ$ Creatinine 0.76 - 1.27 mg/dL 1.04 0.94 0.93  Sodium 134 - 144 mmol/L 139 141 138  Potassium 3.5 - 5.2 mmol/L 4.4 3.9 4.2  Chloride 96 - 106 mmol/L 101 103 100  CO2 20 - 29 mmol/L $RemoveB'23 23 23  'YzLMpSPl$ Calcium 8.7 - 10.2 mg/dL 10.3(H) 10.3(H) 10.1  Total Protein 6.0 - 8.5 g/dL 7.2 7.1 7.2  Total Bilirubin 0.0 - 1.2 mg/dL 0.4 0.6 0.6  Alkaline Phos 44 - 121 IU/L 101 105 99  AST 0 - 40 IU/L $Remov'24 19 19  'dFTWtT$ ALT 0 - 44 IU/L 42 29 20    Lipid Panel      Component Value Date/Time   CHOL 94 (L) 07/13/2020 0811   TRIG 108 07/13/2020 0811   HDL 28 (L) 07/13/2020 0811   CHOLHDL 3.4 07/13/2020 0811   CHOLHDL 3.5 07/18/2014 0922   VLDL NOT CALC 07/18/2014 0922   LDLCALC 46 07/13/2020 0811   LABVLDL 20 07/13/2020 0811      Lab Results  Component Value Date   HGBA1C 14.6 (H) 07/13/2020    Assessment and Plan: 1. Uncontrolled type 2 diabetes mellitus with hyperglycemia ([place]) Uncontrolled with A1c of 14.6; goal is less than 7.0 Uncertain that he has been taking [place] that  he needs to take [place] and he does have 6 refills at the pharmacy Continue with Lantus Counseled on Diabetic diet, my plate method, 982 minutes of moderate intensity exercise/week Blood sugar logs with fasting goals of 80-120 mg/dl, random of less than 180 and in the event of sugars less than 60 mg/dl or greater than 400 mg/dl encouraged to notify the clinic. Advised on the need for annual eye exams, annual foot exams, Pneumonia vaccine. - Hemoglobin A1c; Future  2. History of CVA with residual deficit With right spastic hemiparesis status post Botox injection Continue with AFO brace Followed by rehab medicine Risk factor modification - atorvastatin (LIPITOR) 80 MG tablet; TAKE 1 TABLET BY MOUTH IN THE EVENINGS TO LOWER CHOLESTEROL  Dispense: 90 tablet; Refill: 1  3. Essential hypertension BP at last visit with rehab medicine was elevated Will reassess at in person office visit Advised to keep a blood pressure log at home - carvedilol (COREG) 12.5 MG tablet; TAKE 1 TABLET (12.5 MG TOTAL) BY MOUTH 2 (TWO) TIMES DAILY WITH A MEAL. FOR BLOOD PRESSURE  Dispense: 180 tablet; Refill: 1 - amLODipine (NORVASC) 10 MG tablet; TAKE 1 TABLET (10 MG TOTAL) BY MOUTH DAILY. TO LOWER BLOOD PRESSURE  Dispense: 90 tablet; Refill: 1 - CMP14+EGFR; Future   Follow Up Instructions: 3 months   I discussed the assessment and treatment plan with the patient. The  patient was provided an opportunity to ask questions and all were answered. The patient agreed with the plan and demonstrated an understanding of the instructions.   The patient was advised to call back or seek an in-person evaluation if the symptoms worsen or if the condition fails to improve as anticipated.     I provided 14 minutes total of non-face-to-face time during this encounter.   Charlott Rakes, MD, FAAFP. [facility] [place], [place]   01/19/2021, 10:32 AM

## 2021-01-20 ENCOUNTER — Other Ambulatory Visit: Payer: Medicaid Other

## 2021-01-20 DIAGNOSIS — I639 Cerebral infarction, unspecified: Secondary | ICD-10-CM | POA: Diagnosis not present

## 2021-01-21 ENCOUNTER — Other Ambulatory Visit: Payer: Self-pay

## 2021-01-21 ENCOUNTER — Other Ambulatory Visit: Payer: Self-pay | Admitting: *Deleted

## 2021-01-21 DIAGNOSIS — I639 Cerebral infarction, unspecified: Secondary | ICD-10-CM | POA: Diagnosis not present

## 2021-01-21 NOTE — Patient Instructions (Signed)
Visit Information  Casey Petty was given information about Medicaid Managed Care team care coordination services as a part of their [place] Medicaid benefit. Casey Petty verbally consented to engagement with the [facility] Managed Care team.   For questions related to your [facility], please call: [phone] or visit the homepage here: [email or web address]  If you would like to schedule transportation through your [facility], please call the following number at least 2 days in advance of your appointment: [phone].   Call the [facility] at [phone], at any time, 24 hours a day, 7 days a week. If you are in danger or need immediate medical attention call 911.  Casey Petty - following are the goals we discussed in your visit today:   Goals Addressed             This Visit's Progress    Monitor and Manage My Blood Sugar-Diabetes Type 2       Timeframe:  Long-Range Goal Priority:  High Start Date:  01/21/21                           Expected End Date:   04/26/21                    Follow Up Date 02/04/21    - eat three small meals a day, adhering to a diabetic diet - call your PCP office to schedule to have ordered labs drawn - work with [facility] Pharmacist, Ovid Curd for medication management - check blood sugar at prescribed times - enter blood sugar readings and medication or insulin into daily log - take the blood sugar meter to all doctor visits    Why is this important?   Checking your blood sugar at home helps to keep it from getting very high or very low.  Writing the results in a diary or log helps the doctor know how to care for you.  Your blood sugar log should have the time, date and the results.  Also, write down the amount of insulin or other medicine that you take.  Other information, like what you ate, exercise done and  how you were feeling, will also be helpful.              Please see education materials related to diabetes provided as print materials.   The patient verbalized understanding of instructions provided today and agreed to receive a mailed copy of patient instruction and/or educational materials.  Telephone follow up appointment with Managed Medicaid care management team member scheduled for:02/04/21 @ [place] RN, BSN [facility] RN Care Coordinator   Following is a copy of your plan of care:  Patient Care Plan: Diabetes Type 2 (Adult)     Problem Identified: Glycemic Management (Diabetes, Type 2)      Long-Range Goal: Glycemic Management Optimized   Start Date: 01/21/2021  Expected End Date: 04/26/2021  This Visit's Progress: On track  Priority: High  Note:   Objective:  Lab Results  Component Value Date   HGBA1C 14.6 (H) 07/13/2020   Lab Results  Component Value Date   CREATININE 1.04 09/01/2020   CREATININE 0.94 07/24/2019   CREATININE 0.93 05/08/2019   No results found for: EGFR Current Barriers:  Knowledge Deficits related to basic Diabetes pathophysiology and self care/management-[name] is managing his health after having a  stroke in 2020. He has right side hemiparesis, DMII and HTN. His DM is poorly controlled. Patient is unable to check his blood sugar and does not like to be stuck. He is interested in CGM. Patient administers his medications, but has limited understanding of his medications. He is interested in having his prescriptions delivered to his home. Patient reports his wheelchair is not in good shape and needs to be replaced or repaired. He lives with his fiance who helps him with managing his care. Knowledge Deficits related to medications used for management of diabetes Difficulty obtaining or cannot afford medications Financial Constraints Cognitive Deficits Does not use cbg meter  Unable to independently  monitor blood sugar Does not adhere to provider recommendations VB:[id number] blood sugar, maintaining a diabetic diet  Does not adhere to prescribed medication regimen Lacks social connections Does not contact provider office for questions/concerns Case Manager Clinical Goal(s):  patient will demonstrate improved adherence to prescribed treatment plan for diabetes self care/management as evidenced by: adherence to ADA/ carb modified diet, adherence to prescribed medication regimen, contacting provider for new or worsened symptoms or questions Interventions:  Inter-disciplinary care team collaboration (see longitudinal plan of care) Provided education to patient about basic DM disease process Reviewed medications with patient and discussed importance of medication adherence Discussed plans with patient for ongoing care management follow up and provided patient with direct contact information for care management team Referral made to pharmacy team for assistance with medication management-Patient would like medication delivered Review of patient status, including review of consultants reports, relevant laboratory and other test results, and medications completed. Collaborated with [employer] regarding broken wheelchair. Per [employer] Mr. Appleby wheelchair is no longer a rental. He will have to take it to the retail office ([address], [place],  [address]) for assessment and repair.  Collaborate with PCP for CGM and/or diabetic education class Self-Care Activities - Self administers oral medications as prescribed Self administers insulin as prescribed Attends all scheduled provider appointments Adheres to prescribed ADA/carb modified Patient Goals: - eat three small meals a day, adhering to a diabetic diet - call your PCP office to schedule to have ordered labs drawn - work with [facility] Pharmacist, Ovid Curd for medication management - check blood sugar at prescribed times - enter blood sugar  readings and medication or insulin into daily log - take the blood sugar meter to all doctor visits  Follow Up Plan: Telephone follow up appointment with care management team member scheduled for:02/04/21 @ 9am

## 2021-01-21 NOTE — Patient Outreach (Signed)
Medicaid Managed Care   Nurse Care Manager Note  01/21/2021 Name:  Casey Petty MRN:  [id number] DOB:  1960-10-19  Casey Petty is an 60 y.o. year old male who is a primary patient of Charlott Rakes, MD.  The [facility] Managed Care Coordination team was consulted for assistance with:    HTN DMII Hx Stroke  Mr. Havard was given information about Medicaid Managed Care Coordination team services today. [name] agreed to services and verbal consent obtained.  Engaged with patient by telephone for initial visit in response to provider referral for case management and/or care coordination services.   Assessments/Interventions:  Review of past medical history, allergies, medications, health status, including review of consultants reports, laboratory and other test data, was performed as part of comprehensive evaluation and provision of chronic care management services.  SDOH (Social Determinants of Health) assessments and interventions performed:   Care Plan  Allergies  Allergen Reactions   Lisinopril Swelling    Angioedema- lips/face    Medications Reviewed Today     Reviewed by [name], RN (Registered Nurse) on 01/21/21 at [place] List Status: <None>   Medication Order Taking? Sig Documenting Provider Last Dose Status Informant  acetaminophen (TYLENOL) 325 MG tablet [id number]  Take 1-2 tablets (325-650 mg total) by mouth every 4 (four) hours as needed for mild pain. [name], PA-C  Active            Med Note [name], [name]   Tue Nov 24, 2020 10:30 AM) Prn only  amLODipine (NORVASC) 10 MG tablet [id number] Yes TAKE 1 TABLET (10 MG TOTAL) BY MOUTH DAILY. TO LOWER BLOOD PRESSURE Charlott Rakes, MD Taking Active   aspirin 81 MG EC tablet [id number] No Take 1 tablet (81 mg total) by mouth daily.  Patient not taking: Reported on 01/21/2021   [name], PA-C Not Taking Active   atorvastatin (LIPITOR) 80 MG tablet [id number] Yes TAKE 1 TABLET BY MOUTH IN  THE EVENINGS TO LOWER CHOLESTEROL Charlott Rakes, MD Taking Active   baclofen (LIORESAL) 20 MG tablet [id number] No TAKE 1.5 TABLETS (30 MG TOTAL) BY MOUTH 3 (THREE) TIMES DAILY.  Patient not taking: Reported on 01/21/2021   [name], MD Not Taking Active            Med Note [name], Shakevia Sarris A   Thu Jan 21, 2021  9:24 AM) Needs to pick up prescription  Blood Glucose Monitoring Suppl (TRUE METRIX METER) w/Device KIT [id number]  Check blood sugar fasting and before meals and again if pt feels bad (symptoms of hypo). Fulp, Cammie, MD  Active   carvedilol (COREG) 12.5 MG tablet [id number] Yes TAKE 1 TABLET (12.5 MG TOTAL) BY MOUTH 2 (TWO) TIMES DAILY WITH A MEAL. FOR BLOOD PRESSURE Charlott Rakes, MD Taking Active   clotrimazole (LOTRIMIN) 1 % cream [id number] No Apply to affected area 2 times daily for the next 7 days.  Patient not taking: Reported on 01/21/2021   [name], PA-C Not Taking Active   dapagliflozin propanediol (FARXIGA) 10 MG TABS tablet [id number] No TAKE 1 TABLET (10 MG TOTAL) BY MOUTH DAILY BEFORE BREAKFAST.  Patient not taking: Reported on 01/21/2021   Charlott Rakes, MD Not Taking Active            Med Note (Amali Uhls A   Thu Jan 21, 2021  9:25 AM) Needs to pick up prescription  diclofenac Sodium (VOLTAREN) 1 % GEL [id number] No Apply 2 g topically 4 (four) times  daily.  Patient not taking: Reported on 01/21/2021   [name], MD Not Taking Active   ezetimibe (ZETIA) 10 MG tablet [id number] No TAKE 1 TABLET (10 MG TOTAL) BY MOUTH DAILY.  Patient not taking: Reported on 01/21/2021   Charlott Rakes, MD Not Taking Active            Med Note (Druanne Bosques A   Thu Jan 21, 2021  9:27 AM) Needs to pick up prescription  ezetimibe (ZETIA) 10 MG tablet [id number] No TAKE 1 TABLET (10 MG TOTAL) BY MOUTH DAILY.  Patient not taking: Reported on 01/21/2021   [name], NP Not Taking Active   famotidine (PEPCID) 20 MG tablet [id number] Yes Take 1 tablet (20 mg total) by  mouth daily. [name], PA-C Taking Active            Med Note (Marinda Tyer A   Thu Jan 21, 2021  9:22 AM) Taking as needed  glucose blood (TRUE METRIX BLOOD GLUCOSE TEST) test strip [id number] No Use as instructed  Patient not taking: Reported on 01/21/2021   [name], MD Not Taking Active   hydrALAZINE (APRESOLINE) 100 MG tablet [id number] Yes TAKE 1 TABLET (100 MG TOTAL) BY MOUTH EVERY 8 (EIGHT) HOURS. TO LOWER BLOOD PRESSURE Charlott Rakes, MD Taking Active   insulin glargine (LANTUS) 100 UNIT/ML Solostar Pen [id number] Yes INJECT 10 UNITS INTO THE SKIN DAILY. Charlott Rakes, MD Taking Active   Misc. Devices [place] [id number]  Wheelchair with Accessories: elevating leg rests (ELRs), wheel locks, extensions and anti-tippers. Cane or walker will not suffice Charlott Rakes, MD  Active   TRUEplus Lancets 28G MISC [id number] No Check blood sugar fasting and before meals and again if pt feels bad (symptoms of hypo).  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   Abnormality of gait 09/17/2019   Spastic hemiparesis of right dominant side ([place]) 07/02/2019   History of CVA with residual deficit 06/09/2019   Spastic hemiparesis (HCC)    Uncontrolled type 2 diabetes mellitus with hyperglycemia (HCC)    Noncompliance    Benign essential HTN    Newly diagnosed diabetes ([place])    Leucocytosis    Right hemiparesis ([place])    Basal ganglia stroke ([place]) 03/04/2019   Stroke ([place]) 02/28/2019   Essential hypertension 02/28/2019   Tobacco abuse 02/28/2019   Hyperglycemia 02/28/2019    Conditions to be addressed/monitored per PCP order:  HTN, DMII, and Hx stroke  Care Plan : Diabetes Type 2 (Adult)  Updates made by [name], RN since 01/21/2021 12:00 AM     Problem: Glycemic Management  (Diabetes, Type 2)      Long-Range Goal: Glycemic Management Optimized   Start Date: 01/21/2021  Expected End Date: 04/26/2021  This Visit's Progress: On track  Priority: High  Note:   Objective:  Lab Results  Component Value Date   HGBA1C 14.6 (H) 07/13/2020   Lab Results  Component Value Date   CREATININE 1.04 09/01/2020   CREATININE 0.94 07/24/2019   CREATININE 0.93 05/08/2019   No results found for: EGFR Current Barriers:  Knowledge Deficits related to basic Diabetes pathophysiology and self care/management-Mr. Mulvaney is managing his health after having a stroke in 2020. He has right side  hemiparesis, DMII and HTN. His DM is poorly controlled. Patient is unable to check his blood sugar and does not like to be stuck. He is interested in CGM. Patient administers his medications, but has limited understanding of his medications. He is interested in having his prescriptions delivered to his home. Patient reports his wheelchair is not in good shape and needs to be replaced or repaired. He lives with his fiance who helps him with managing his care. Knowledge Deficits related to medications used for management of diabetes Difficulty obtaining or cannot afford medications Financial Constraints Cognitive Deficits Does not use cbg meter  Unable to independently monitor blood sugar Does not adhere to provider recommendations [id number] blood sugar, maintaining a diabetic diet  Does not adhere to prescribed medication regimen Lacks social connections Does not contact provider office for questions/concerns Case Manager Clinical Goal(s):  patient will demonstrate improved adherence to prescribed treatment plan for diabetes self care/management as evidenced by: adherence to ADA/ carb modified diet, adherence to prescribed medication regimen, contacting provider for new or worsened symptoms or questions Interventions:  Inter-disciplinary care team collaboration (see longitudinal plan of  care) Provided education to patient about basic DM disease process Reviewed medications with patient and discussed importance of medication adherence Discussed plans with patient for ongoing care management follow up and provided patient with direct contact information for care management team Referral made to pharmacy team for assistance with medication management-Patient would like medication delivered Review of patient status, including review of consultants reports, relevant laboratory and other test results, and medications completed. Collaborated with [employer] regarding broken wheelchair. Per [employer] Mr. Kinnett wheelchair is no longer a rental. He will have to take it to the retail office ([address], [place], [place] [address]) for assessment and repair.  Collaborate with PCP for CGM and/or diabetic education class Self-Care Activities - Self administers oral medications as prescribed Self administers insulin as prescribed Attends all scheduled provider appointments Adheres to prescribed ADA/carb modified Patient Goals: - eat three small meals a day, adhering to a diabetic diet - call your PCP office to schedule to have ordered labs drawn - work with [facility] Pharmacist, Ovid Curd for medication management - check blood sugar at prescribed times - enter blood sugar readings and medication or insulin into daily log - take the blood sugar meter to all doctor visits  Follow Up Plan: Telephone follow up appointment with care management team member scheduled for:02/04/21 @ 9am      Follow Up:  Patient agrees to Care Plan and Follow-up.  Plan: The Managed Medicaid care management team will reach out to the patient again over the next 14 days.  Date/time of next scheduled RN care management/care coordination outreach:  02/04/21 @ [place] RN, [place] RN Care Coordinator

## 2021-01-22 ENCOUNTER — Other Ambulatory Visit: Payer: Self-pay | Admitting: Pharmacist

## 2021-01-22 DIAGNOSIS — I639 Cerebral infarction, unspecified: Secondary | ICD-10-CM | POA: Diagnosis not present

## 2021-01-22 MED ORDER — DEXCOM G6 TRANSMITTER MISC
3 refills | Status: DC
  Filled 2021-01-22: qty 1, 30d supply, fill #0
  Filled 2021-01-25: qty 1, 1d supply, fill #0

## 2021-01-22 MED ORDER — DEXCOM G6 RECEIVER DEVI
0 refills | Status: DC
  Filled 2021-01-22 – 2021-01-25 (×2): qty 1, 1d supply, fill #0

## 2021-01-22 MED ORDER — DEXCOM G6 SENSOR MISC
3 refills | Status: DC
  Filled 2021-01-22: qty 3, 30d supply, fill #0
  Filled 2021-01-25: qty 3, 28d supply, fill #0

## 2021-01-23 DIAGNOSIS — I639 Cerebral infarction, unspecified: Secondary | ICD-10-CM | POA: Diagnosis not present

## 2021-01-25 ENCOUNTER — Other Ambulatory Visit: Payer: Self-pay

## 2021-01-25 ENCOUNTER — Other Ambulatory Visit (HOSPITAL_COMMUNITY): Payer: Self-pay

## 2021-01-25 DIAGNOSIS — I639 Cerebral infarction, unspecified: Secondary | ICD-10-CM | POA: Diagnosis not present

## 2021-01-26 DIAGNOSIS — I639 Cerebral infarction, unspecified: Secondary | ICD-10-CM | POA: Diagnosis not present

## 2021-01-27 ENCOUNTER — Other Ambulatory Visit: Payer: Self-pay

## 2021-01-27 DIAGNOSIS — I639 Cerebral infarction, unspecified: Secondary | ICD-10-CM | POA: Diagnosis not present

## 2021-01-27 NOTE — Patient Outreach (Signed)
Medicaid Managed Care    Pharmacy Note  01/27/2021 Name: Emmauel Hallums MRN: [id number] DOB: [birth year]  Cortland Crehan is a 60 y.o. year old male who is a primary care patient of Charlott Rakes, MD. The [facility] Managed Care Coordination team was consulted for assistance with disease management and care coordination needs.    Engaged with patient Engaged with patient by telephone for initial visit in response to referral for case management and/or care coordination services.  Mr. Humber was given information about Managed Medicaid Care Coordination team services today. [name] agreed to services and verbal consent obtained.   Objective:  Lab Results  Component Value Date   CREATININE 1.04 09/01/2020   CREATININE 0.94 07/24/2019   CREATININE 0.93 05/08/2019    Lab Results  Component Value Date   HGBA1C 14.6 (H) 07/13/2020       Component Value Date/Time   CHOL 94 (L) 07/13/2020 0811   TRIG 108 07/13/2020 0811   HDL 28 (L) 07/13/2020 0811   CHOLHDL 3.4 07/13/2020 0811   CHOLHDL 3.5 07/18/2014 0922   VLDL NOT CALC 07/18/2014 0922   LDLCALC 46 07/13/2020 0811    Other: (TSH, CBC, Vit D, etc.)  Clinical ASCVD: Yes  The ASCVD Risk score Mikey Bussing DC Jr., et al., 2013) failed to calculate for the following reasons:   The patient has a prior MI or stroke diagnosis    Other: (CHADS2VASc if Afib, PHQ9 if depression, MMRC or CAT for COPD, ACT, DEXA)  BP Readings from Last 3 Encounters:  12/31/20 (!) 143/83  11/24/20 135/77  11/02/20 (!) 151/80    Assessment/Interventions: Review of patient past medical history, allergies, medications, health status, including review of consultants reports, laboratory and other test data, was performed as part of comprehensive evaluation and provision of chronic care management services.   HTN Amlodipine Carvedilol Hydralazine Plan: At goal,  patient stable/ symptoms controlled   Lipids Lab Results  Component Value Date    CHOL 94 (L) 07/13/2020   CHOL 217 (H) 01/23/2020   CHOL 120 05/08/2019   Lab Results  Component Value Date   HDL 28 (L) 07/13/2020   HDL 28 (L) 01/23/2020   HDL 47 05/08/2019   Lab Results  Component Value Date   LDLCALC 46 07/13/2020   LDLCALC 149 (H) 01/23/2020   LDLCALC 57 05/08/2019   Lab Results  Component Value Date   TRIG 108 07/13/2020   TRIG 220 (H) 01/23/2020   TRIG 82 05/08/2019   Lab Results  Component Value Date   CHOLHDL 3.4 07/13/2020   CHOLHDL 7.8 (H) 01/23/2020   CHOLHDL 2.6 05/08/2019   No results found for: LDLDIRECT Atorvastatin 80mg  Ezetimibe Plan: At goal,  patient stable/ symptoms controlled   DM Lab Results  Component Value Date   HGBA1C 14.6 (H) 07/13/2020   HGBA1C 12.2 (H) 01/23/2020   HGBA1C 8.4 (A) 07/24/2019   Lab Results  Component Value Date   LDLCALC 46 07/13/2020   CREATININE 1.04 09/01/2020    Lab Results  Component Value Date   NA 139 09/01/2020   K 4.4 09/01/2020   CREATININE 1.04 09/01/2020   GFRNONAA 79 09/01/2020   GFRAA 92 09/01/2020   GLUCOSE 389 (H) 09/01/2020   -Dexcom -Farxiga -Glargine Plan: Patient's sugars are not at goal but hasn't been taking all meds, patient will be onboarded to [facility], will monitor compliance and once we get a baseline for sugars when compliant, will re-assess then   SDOH (Social Determinants of Health) assessments  and interventions performed:    Care Plan  Allergies  Allergen Reactions   Lisinopril Swelling    Angioedema- lips/face    Medications Reviewed Today     Reviewed by [name], RN (Registered Nurse) on 01/21/21 at [place] List Status: <None>   Medication Order Taking? Sig Documenting Provider Last Dose Status Informant  acetaminophen (TYLENOL) 325 MG tablet [id number]  Take 1-2 tablets (325-650 mg total) by mouth every 4 (four) hours as needed for mild pain. [name], PA-C  Active            Med Note [name], [name]   Tue Nov 24, 2020 10:30  AM) Prn only  amLODipine (NORVASC) 10 MG tablet [id number] Yes TAKE 1 TABLET (10 MG TOTAL) BY MOUTH DAILY. TO LOWER BLOOD PRESSURE Charlott Rakes, MD Taking Active   aspirin 81 MG EC tablet [id number] No Take 1 tablet (81 mg total) by mouth daily.  Patient not taking: Reported on 01/21/2021   [name], PA-C Not Taking Active   atorvastatin (LIPITOR) 80 MG tablet [id number] Yes TAKE 1 TABLET BY MOUTH IN THE EVENINGS TO LOWER CHOLESTEROL Charlott Rakes, MD Taking Active   baclofen (LIORESAL) 20 MG tablet [id number] No TAKE 1.5 TABLETS (30 MG TOTAL) BY MOUTH 3 (THREE) TIMES DAILY.  Patient not taking: Reported on 01/21/2021   [name], MD Not Taking Active            Med Note [name], MELANIE A   Thu Jan 21, 2021  9:24 AM) Needs to pick up prescription  Blood Glucose Monitoring Suppl (TRUE METRIX METER) w/Device KIT [id number]  Check blood sugar fasting and before meals and again if pt feels bad (symptoms of hypo). Fulp, Cammie, MD  Active   carvedilol (COREG) 12.5 MG tablet [id number] Yes TAKE 1 TABLET (12.5 MG TOTAL) BY MOUTH 2 (TWO) TIMES DAILY WITH A MEAL. FOR BLOOD PRESSURE Charlott Rakes, MD Taking Active   clotrimazole (LOTRIMIN) 1 % cream [id number] No Apply to affected area 2 times daily for the next 7 days.  Patient not taking: Reported on 01/21/2021   [name], PA-C Not Taking Active   dapagliflozin propanediol (FARXIGA) 10 MG TABS tablet [id number] No TAKE 1 TABLET (10 MG TOTAL) BY MOUTH DAILY BEFORE BREAKFAST.  Patient not taking: Reported on 01/21/2021   Charlott Rakes, MD Not Taking Active            Med Note (ROBB, MELANIE A   Thu Jan 21, 2021  9:25 AM) Needs to pick up prescription  diclofenac Sodium (VOLTAREN) 1 % GEL [id number] No Apply 2 g topically 4 (four) times daily.  Patient not taking: Reported on 01/21/2021   [name], MD Not Taking Active   ezetimibe (ZETIA) 10 MG tablet [id number] No TAKE 1 TABLET (10 MG TOTAL) BY MOUTH DAILY.  Patient not taking:  Reported on 01/21/2021   Charlott Rakes, MD Not Taking Active            Med Note (ROBB, MELANIE A   Thu Jan 21, 2021  9:27 AM) Needs to pick up prescription  ezetimibe (ZETIA) 10 MG tablet [id number] No TAKE 1 TABLET (10 MG TOTAL) BY MOUTH DAILY.  Patient not taking: Reported on 01/21/2021   [name], NP Not Taking Active   famotidine (PEPCID) 20 MG tablet [id number] Yes Take 1 tablet (20 mg total) by mouth daily. [name], PA-C Taking Active  Med Note (ROBB, MELANIE A   Thu Jan 21, 2021  9:22 AM) Taking as needed  glucose blood (TRUE METRIX BLOOD GLUCOSE TEST) test strip [id number] No Use as instructed  Patient not taking: Reported on 01/21/2021   [name], MD Not Taking Active   hydrALAZINE (APRESOLINE) 100 MG tablet [id number] Yes TAKE 1 TABLET (100 MG TOTAL) BY MOUTH EVERY 8 (EIGHT) HOURS. TO LOWER BLOOD PRESSURE Charlott Rakes, MD Taking Active   insulin glargine (LANTUS) 100 UNIT/ML Solostar Pen [id number] Yes INJECT 10 UNITS INTO THE SKIN DAILY. Charlott Rakes, MD Taking Active   Misc. Devices [place] [id number]  Wheelchair with Accessories: elevating leg rests (ELRs), wheel locks, extensions and anti-tippers. Cane or walker will not suffice Charlott Rakes, MD  Active   TRUEplus Lancets 28G MISC [id number] No Check blood sugar fasting and before meals and again if pt feels bad (symptoms of hypo).  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   Abnormality of gait 09/17/2019   Spastic hemiparesis of right dominant side ([place]) 07/02/2019   History of CVA with residual deficit 06/09/2019   Spastic hemiparesis (HCC)    Uncontrolled type 2 diabetes mellitus with hyperglycemia ([place])    Noncompliance    Benign essential HTN    Newly diagnosed diabetes ([place])    Leucocytosis     Right hemiparesis ([place])    Basal ganglia stroke ([place]) 03/04/2019   Stroke ([place]) 02/28/2019   Essential hypertension 02/28/2019   Tobacco abuse 02/28/2019   Hyperglycemia 02/28/2019    Conditions to be addressed/monitored: HTN, HLD, Hypertriglyceridemia, and DM  Care Plan : Medication Management  Updates made by Lane Hacker, [place] since 01/27/2021 12:00 AM     Problem: Health Promotion or Disease Self-Management (General Plan of Care)      Goal: Medication Management   Note:   Current Barriers:  Unable to independently monitor therapeutic efficacy Unable to achieve control of DM  Does not contact provider office for questions/concerns   Pharmacist Clinical Goal(s):  Over the next 30 days, patient will contact provider office for questions/concerns as evidenced notation of same in electronic health record through collaboration with PharmD and provider.    Interventions: Inter-disciplinary care team collaboration (see longitudinal plan of care) Comprehensive medication review performed; medication list updated in electronic medical record      Patient Goals/Self-Care Activities Over the next 30 days, patient will:  - collaborate with provider on medication access solutions  Follow Up Plan: The care management team will reach out to the patient again over the next 30 days.      Task: Mutually Develop and Royce Macadamia Achievement of Patient Goals   Note:   Care Management Activities:    - verbalization of feelings encouraged    Notes:        Medication Assistance:  Due to stroke and medical Hx, would like meds delivered so his fiance doesn't have to pick them up Plan: Verbal consent obtained for [facility] enhanced pharmacy services (medication synchronization, adherence packaging, delivery coordination). A medication sync plan was created to allow patient to get all medications delivered once every 30 to 90 days per patient preference. Patient understands  they have freedom to choose pharmacy and clinical pharmacist will coordinate care between  all prescribers and [facility].   Follow up: Agree  Plan: The patient has been provided with contact information for the care management team and has been advised to call with any health related questions or concerns.   [place] Constable, Pharm.D., Managed Medicaid Pharmacist - [phone]

## 2021-01-27 NOTE — Patient Instructions (Signed)
Visit Information  Mr. Ciullo was given information about Medicaid Managed Care team care coordination services as a part of their [place] Medicaid benefit. Joedy Eickhoff verbally consented to engagement with the [facility] Managed Care team.   For questions related to your [facility], please call: [phone] or visit the homepage here: [email or web address]  If you would like to schedule transportation through your [facility], please call the following number at least 2 days in advance of your appointment: [phone].   Call the [place] at [phone], at any time, 24 hours a day, 7 days a week. If you are in danger or need immediate medical attention call 911.  Mr. Basaldua - following are the goals we discussed in your visit today:   Goals Addressed   None     Please see education materials related to DM provided as print materials.   Patient verbalizes understanding of instructions provided today.   The patient has been provided with contact information for the Managed Medicaid care management team and has been advised to call with any health related questions or concerns.   [place] Constable, Pharm.D., Managed Medicaid Pharmacist [phone]   Following is a copy of your plan of care:  Patient Care Plan: Diabetes Type 2 (Adult)     Problem Identified: Glycemic Management (Diabetes, Type 2)      Long-Range Goal: Glycemic Management Optimized   Start Date: 01/21/2021  Expected End Date: 04/26/2021  This Visit's Progress: On track  Priority: High  Note:   Objective:  Lab Results  Component Value Date   HGBA1C 14.6 (H) 07/13/2020   Lab Results  Component Value Date   CREATININE 1.04 09/01/2020   CREATININE 0.94 07/24/2019   CREATININE 0.93 05/08/2019   No results found for: EGFR Current Barriers:  Knowledge  Deficits related to basic Diabetes pathophysiology and self care/management-Mr. Osier is managing his health after having a stroke in 2020. He has right side hemiparesis, DMII and HTN. His DM is poorly controlled. Patient is unable to check his blood sugar and does not like to be stuck. He is interested in CGM. Patient administers his medications, but has limited understanding of his medications. He is interested in having his prescriptions delivered to his home. Patient reports his wheelchair is not in good shape and needs to be replaced or repaired. He lives with his fiance who helps him with managing his care. Knowledge Deficits related to medications used for management of diabetes Difficulty obtaining or cannot afford medications Financial Constraints Cognitive Deficits Does not use cbg meter  Unable to independently monitor blood sugar Does not adhere to provider recommendations ME:[id number] blood sugar, maintaining a diabetic diet  Does not adhere to prescribed medication regimen Lacks social connections Does not contact provider office for questions/concerns Case Manager Clinical Goal(s):  patient will demonstrate improved adherence to prescribed treatment plan for diabetes self care/management as evidenced by: adherence to ADA/ carb modified diet, adherence to prescribed medication regimen, contacting provider for new or worsened symptoms or questions Interventions:  Inter-disciplinary care team collaboration (see longitudinal plan of care) Provided education to patient about basic DM disease process Reviewed medications with patient and discussed importance of medication adherence Discussed plans with patient for ongoing care management follow up and provided patient with direct contact information for care management team Referral made to pharmacy team for assistance with medication management-Patient would like medication delivered Review of patient status, including review of  consultants reports, relevant laboratory and other test results, and medications completed. Collaborated with [employer] regarding broken wheelchair. Per [employer] Mr. Cossey wheelchair is no longer a rental. He will have to take it to the retail office ([address] [place], [place] [address]) for assessment and repair.  Collaborate with PCP for CGM and/or diabetic education class Self-Care Activities - Self administers oral medications as prescribed Self administers insulin as prescribed Attends all scheduled provider appointments Adheres to prescribed ADA/carb modified Patient Goals: - eat three small meals a day, adhering to a diabetic diet - call your PCP office to schedule to have ordered labs drawn - work with MM Pharmacist, Ovid Curd for medication management - check blood sugar at prescribed times - enter blood sugar readings and medication or insulin into daily log - take the blood sugar meter to all doctor visits  Follow Up Plan: Telephone follow up appointment with care management team member scheduled for:02/04/21 @ 9am     Patient Care Plan: Medication Management     Problem Identified: Health Promotion or Disease Self-Management (General Plan of Care)      Goal: Medication Management   Note:   Current Barriers:  Unable to independently monitor therapeutic efficacy Unable to achieve control of DM  Does not contact provider office for questions/concerns   Pharmacist Clinical Goal(s):  Over the next 30 days, patient will contact provider office for questions/concerns as evidenced notation of same in electronic health record through collaboration with PharmD and provider.    Interventions: Inter-disciplinary care team collaboration (see longitudinal plan of care) Comprehensive medication review performed; medication list updated in electronic medical record      Patient Goals/Self-Care Activities Over the next 30 days, patient will:  - collaborate with provider on  medication access solutions  Follow Up Plan: The care management team will reach out to the patient again over the next 30 days.      Task: Mutually Develop and Royce Macadamia Achievement of Patient Goals   Note:   Care Management Activities:    - verbalization of feelings encouraged    Notes:

## 2021-01-28 ENCOUNTER — Other Ambulatory Visit: Payer: Self-pay | Admitting: Family Medicine

## 2021-01-28 ENCOUNTER — Other Ambulatory Visit: Payer: Self-pay

## 2021-01-28 DIAGNOSIS — I639 Cerebral infarction, unspecified: Secondary | ICD-10-CM | POA: Diagnosis not present

## 2021-01-28 MED FILL — Hydralazine HCl Tab 100 MG: ORAL | 90 days supply | Qty: 270 | Fill #0 | Status: AC

## 2021-01-28 MED FILL — Dapagliflozin Propanediol Tab 10 MG (Base Equivalent): ORAL | 30 days supply | Qty: 30 | Fill #0 | Status: AC

## 2021-01-28 MED FILL — Insulin Glargine Soln Pen-Injector 100 Unit/ML: SUBCUTANEOUS | 30 days supply | Qty: 3 | Fill #1 | Status: AC

## 2021-01-28 MED FILL — Ezetimibe Tab 10 MG: ORAL | 90 days supply | Qty: 90 | Fill #0 | Status: AC

## 2021-01-29 ENCOUNTER — Other Ambulatory Visit: Payer: Self-pay

## 2021-01-29 DIAGNOSIS — I639 Cerebral infarction, unspecified: Secondary | ICD-10-CM | POA: Diagnosis not present

## 2021-01-30 DIAGNOSIS — I639 Cerebral infarction, unspecified: Secondary | ICD-10-CM | POA: Diagnosis not present

## 2021-02-01 DIAGNOSIS — I639 Cerebral infarction, unspecified: Secondary | ICD-10-CM | POA: Diagnosis not present

## 2021-02-02 ENCOUNTER — Other Ambulatory Visit: Payer: Self-pay | Admitting: Pharmacist

## 2021-02-02 ENCOUNTER — Other Ambulatory Visit: Payer: Self-pay

## 2021-02-02 ENCOUNTER — Telehealth: Payer: Self-pay | Admitting: *Deleted

## 2021-02-02 ENCOUNTER — Other Ambulatory Visit: Payer: Self-pay | Admitting: *Deleted

## 2021-02-02 DIAGNOSIS — I6930 Unspecified sequelae of cerebral infarction: Secondary | ICD-10-CM

## 2021-02-02 DIAGNOSIS — I10 Essential (primary) hypertension: Secondary | ICD-10-CM

## 2021-02-02 DIAGNOSIS — I639 Cerebral infarction, unspecified: Secondary | ICD-10-CM | POA: Diagnosis not present

## 2021-02-02 MED ORDER — TRUEPLUS PEN NEEDLES 32G X 4 MM MISC: 2 refills | Status: DC

## 2021-02-02 MED ORDER — BACLOFEN 20 MG PO TABS: ORAL_TABLET | ORAL | 3 refills | Status: DC

## 2021-02-02 MED ORDER — FAMOTIDINE 20 MG PO TABS: 20.0000 mg | ORAL_TABLET | Freq: Every day | ORAL | 0 refills | Status: DC

## 2021-02-02 MED ORDER — DEXCOM G6 TRANSMITTER MISC: 3 refills | Status: DC

## 2021-02-02 MED ORDER — DEXCOM G6 SENSOR MISC: 3 refills | Status: DC

## 2021-02-02 MED ORDER — HYDRALAZINE HCL 100 MG PO TABS: ORAL_TABLET | ORAL | 1 refills | Status: DC

## 2021-02-02 MED ORDER — EZETIMIBE 10 MG PO TABS: ORAL_TABLET | Freq: Every day | ORAL | 3 refills | Status: DC

## 2021-02-02 MED ORDER — CARVEDILOL 12.5 MG PO TABS: ORAL_TABLET | ORAL | 1 refills | Status: DC

## 2021-02-02 MED ORDER — ASPIRIN 81 MG PO TBEC: 81.0000 mg | DELAYED_RELEASE_TABLET | Freq: Every day | ORAL | 0 refills | Status: DC

## 2021-02-02 MED ORDER — ATORVASTATIN CALCIUM 80 MG PO TABS: ORAL_TABLET | ORAL | 1 refills | Status: DC

## 2021-02-02 MED ORDER — DAPAGLIFLOZIN PROPANEDIOL 10 MG PO TABS: ORAL_TABLET | Freq: Every day | ORAL | 1 refills | Status: DC

## 2021-02-02 MED ORDER — INSULIN GLARGINE 100 UNIT/ML SOLOSTAR PEN: 10.0000 [IU] | PEN_INJECTOR | Freq: Every day | SUBCUTANEOUS | 6 refills | Status: DC

## 2021-02-02 MED ORDER — AMLODIPINE BESYLATE 10 MG PO TABS: ORAL_TABLET | Freq: Every day | ORAL | 1 refills | Status: DC

## 2021-02-02 MED ORDER — DEXCOM G6 RECEIVER DEVI: 0 refills | Status: DC

## 2021-02-02 MED ORDER — TRUEPLUS LANCETS 28G MISC: 12 refills | Status: DC

## 2021-02-02 NOTE — Telephone Encounter (Signed)
Ovid Curd from [facility] called to inform that they have found a new pharmacy form Mr Rout that will deliver to encourage compliance. Please send his baclofen to [facility]. Done.

## 2021-02-03 DIAGNOSIS — I639 Cerebral infarction, unspecified: Secondary | ICD-10-CM | POA: Diagnosis not present

## 2021-02-04 ENCOUNTER — Other Ambulatory Visit: Payer: Self-pay

## 2021-02-04 ENCOUNTER — Other Ambulatory Visit: Payer: Self-pay | Admitting: *Deleted

## 2021-02-04 DIAGNOSIS — I639 Cerebral infarction, unspecified: Secondary | ICD-10-CM | POA: Diagnosis not present

## 2021-02-04 DIAGNOSIS — Z5941 Food insecurity: Secondary | ICD-10-CM

## 2021-02-04 NOTE — Patient Outreach (Signed)
Medicaid Managed Care   Nurse Care Manager Note  02/04/2021 Name:  Casey Petty MRN:  [id number] DOB:  Aug 15, 1960  Casey Petty is an 60 y.o. year old male who is a primary patient of Casey Rakes, MD.  The [facility] Managed Care Coordination team was consulted for assistance with:    DMII  [name] was given information about Medicaid Managed Care Coordination team services today. Casey Petty agreed to services and verbal consent obtained.  Engaged with patient by telephone for follow up visit in response to provider referral for case management and/or care coordination services.   Assessments/Interventions:  Review of past medical history, allergies, medications, health status, including review of consultants reports, laboratory and other test data, was performed as part of comprehensive evaluation and provision of chronic care management services.  SDOH (Social Determinants of Health) assessments and interventions performed:   Care Plan  Allergies  Allergen Reactions   Lisinopril Swelling    Angioedema- lips/face    Medications Reviewed Today     Reviewed by [name], RN (Registered Nurse) on 01/21/21 at [place] List Status: <None>   Medication Order Taking? Sig Documenting Provider Last Dose Status Informant  acetaminophen (TYLENOL) 325 MG tablet [id number]  Take 1-2 tablets (325-650 mg total) by mouth every 4 (four) hours as needed for mild pain. [name], PA-C  Active            Med Note [name], [name]   Tue Nov 24, 2020 10:30 AM) Prn only  amLODipine (NORVASC) 10 MG tablet [id number] Yes TAKE 1 TABLET (10 MG TOTAL) BY MOUTH DAILY. TO LOWER BLOOD PRESSURE Casey Rakes, MD Taking Active   aspirin 81 MG EC tablet [id number] No Take 1 tablet (81 mg total) by mouth daily.  Patient not taking: Reported on 01/21/2021   [name], PA-C Not Taking Active   atorvastatin (LIPITOR) 80 MG tablet [id number] Yes TAKE 1 TABLET BY MOUTH IN THE EVENINGS  TO LOWER CHOLESTEROL Casey Rakes, MD Taking Active   baclofen (LIORESAL) 20 MG tablet [id number] No TAKE 1.5 TABLETS (30 MG TOTAL) BY MOUTH 3 (THREE) TIMES DAILY.  Patient not taking: Reported on 01/21/2021   [name], MD Not Taking Active            Med Note [name], Lonnie Rosado A   Thu Jan 21, 2021  9:24 AM) Needs to pick up prescription  Blood Glucose Monitoring Suppl (TRUE METRIX METER) w/Device KIT [id number]  Check blood sugar fasting and before meals and again if pt feels bad (symptoms of hypo). Fulp, Cammie, MD  Active   carvedilol (COREG) 12.5 MG tablet [id number] Yes TAKE 1 TABLET (12.5 MG TOTAL) BY MOUTH 2 (TWO) TIMES DAILY WITH A MEAL. FOR BLOOD PRESSURE Casey Rakes, MD Taking Active   clotrimazole (LOTRIMIN) 1 % cream [id number] No Apply to affected area 2 times daily for the next 7 days.  Patient not taking: Reported on 01/21/2021   [name], PA-C Not Taking Active   dapagliflozin propanediol (FARXIGA) 10 MG TABS tablet [id number] No TAKE 1 TABLET (10 MG TOTAL) BY MOUTH DAILY BEFORE BREAKFAST.  Patient not taking: Reported on 01/21/2021   Casey Rakes, MD Not Taking Active            Med Note ([place] Droll A   Thu Jan 21, 2021  9:25 AM) Needs to pick up prescription  diclofenac Sodium (VOLTAREN) 1 % GEL [id number] No Apply 2 g topically 4 (four) times daily.  Patient not taking: Reported on 01/21/2021   [name], MD Not Taking Active   ezetimibe (ZETIA) 10 MG tablet [id number] No TAKE 1 TABLET (10 MG TOTAL) BY MOUTH DAILY.  Patient not taking: Reported on 01/21/2021   Casey Rakes, MD Not Taking Active            Med Note (Laurali Goddard A   Thu Jan 21, 2021  9:27 AM) Needs to pick up prescription  ezetimibe (ZETIA) 10 MG tablet [id number] No TAKE 1 TABLET (10 MG TOTAL) BY MOUTH DAILY.  Patient not taking: Reported on 01/21/2021   [name], NP Not Taking Active   famotidine (PEPCID) 20 MG tablet [id number] Yes Take 1 tablet (20 mg total) by mouth daily.  [name], PA-C Taking Active            Med Note (Avi Archuleta A   Thu Jan 21, 2021  9:22 AM) Taking as needed  glucose blood (TRUE METRIX BLOOD GLUCOSE TEST) test strip [id number] No Use as instructed  Patient not taking: Reported on 01/21/2021   [name], MD Not Taking Active   hydrALAZINE (APRESOLINE) 100 MG tablet [id number] Yes TAKE 1 TABLET (100 MG TOTAL) BY MOUTH EVERY 8 (EIGHT) HOURS. TO LOWER BLOOD PRESSURE Casey Rakes, MD Taking Active   insulin glargine (LANTUS) 100 UNIT/ML Solostar Pen [id number] Yes INJECT 10 UNITS INTO THE SKIN DAILY. Casey Rakes, MD Taking Active   Misc. Devices [place] [id number]  Wheelchair with Accessories: elevating leg rests (ELRs), wheel locks, extensions and anti-tippers. Cane or walker will not suffice Casey Rakes, MD  Active   TRUEplus Lancets 28G MISC [id number] No Check blood sugar fasting and before meals and again if pt feels bad (symptoms of hypo).  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   Abnormality of gait 09/17/2019   Spastic hemiparesis of right dominant side ([place]) 07/02/2019   History of CVA with residual deficit 06/09/2019   Spastic hemiparesis (HCC)    Uncontrolled type 2 diabetes mellitus with hyperglycemia ([place])    Noncompliance    Benign essential HTN    Newly diagnosed diabetes ([place])    Leucocytosis    Right hemiparesis ([place])    Basal ganglia stroke ([place]) 03/04/2019   Stroke ([place]) 02/28/2019   Essential hypertension 02/28/2019   Tobacco abuse 02/28/2019   Hyperglycemia 02/28/2019    Conditions to be addressed/monitored per PCP order:  DMII  Care Plan : Diabetes Type 2 (Adult)  Updates made by [name], RN since 02/04/2021 12:00 AM     Problem: Glycemic Management (Diabetes, Type 2)      Long-Range  Goal: Glycemic Management Optimized   Start Date: 01/21/2021  Expected End Date: 04/26/2021  This Visit's Progress: On track  Recent Progress: On track  Priority: High  Note:   Objective:  Lab Results  Component Value Date   HGBA1C 14.6 (H) 07/13/2020   Lab Results  Component Value Date   CREATININE 1.04 09/01/2020   CREATININE 0.94 07/24/2019   CREATININE 0.93 05/08/2019   No results found for: EGFR Current Barriers:  Knowledge Deficits related to basic Diabetes pathophysiology and self care/management-Mr. Gonzalez is managing his health after having a stroke in 2020. He has right side hemiparesis,  DMII and HTN. His DM is poorly controlled. Patient is unable to check his blood sugar and does not like to be stuck. He is interested in CGM. Patient administers his medications, but has limited understanding of his medications. He is interested in having his prescriptions delivered to his home. Patient reports his wheelchair is not in good shape and needs to be replaced or repaired. He lives with his fiance who helps him with managing his care. Knowledge Deficits related to medications used for management of diabetes Difficulty obtaining or cannot afford medications Financial Constraints Cognitive Deficits Does not use cbg meter  Unable to independently monitor blood sugar Does not adhere to provider recommendations [id number] blood sugar, maintaining a diabetic diet  Does not adhere to prescribed medication regimen Lacks social connections Does not contact provider office for questions/concerns Case Manager Clinical Goal(s):  patient will demonstrate improved adherence to prescribed treatment plan for diabetes self care/management as evidenced by: adherence to ADA/ carb modified diet, adherence to prescribed medication regimen, contacting provider for new or worsened symptoms or questions Interventions:  Inter-disciplinary care team collaboration (see longitudinal plan of  care) Provided education to patient about basic DM disease process Discussed new pharmacy, all medications will be delivered to Mr. Sultana on 7/12 Discussed plans with patient for ongoing care management follow up and provided patient with direct contact information for care management team Referral to Care Guide for assistance with food resources Review of patient status, including review of consultants reports, relevant laboratory and other test results, and medications completed. Collaborated with [employer] regarding broken wheelchair. Per [employer] Mr. Nunziata wheelchair is no longer a rental. He will have to take it to the retail office ([address] [place], [place] [address]) for assessment and repair. Details reviewed with Mr. Ancheta and his fiance Provided information for medical transportation provided by [facility] [phone] Reviewed scheduled appointments for Neurology 7/20 and Botox injection 7/21. Mr. Glazer has transportation to these appointments Provided examples of healthy food choices and education provided on diabetic diet Self-Care Activities - Self administers oral medications as prescribed Self administers insulin as prescribed Attends all scheduled provider appointments Adheres to prescribed ADA/carb modified Patient Goals: - work with Care Guide for food resources - call PCP office with assistance with Continuous Glucose Monitor(Dexcom) once you receive it - eat three small meals a day, adhering to a diabetic diet - call your PCP office to schedule to have ordered labs drawn - work with MM Pharmacist, Ovid Curd for medication management - check blood sugar at prescribed times - enter blood sugar readings and medication or insulin into daily log - take the blood sugar meter to all doctor visits  Follow Up Plan: Telephone follow up appointment with care management team member scheduled for:02/22/21 @ 9am      Follow Up:  Patient agrees to Care Plan and  Follow-up.  Plan: The Managed Medicaid care management team will reach out to the patient again over the next 14 days.  Date/time of next scheduled RN care management/care coordination outreach:  02/22/21 @ [place] RN, BSN [place]  [employer] manager

## 2021-02-04 NOTE — Patient Instructions (Signed)
Visit Information  [name] was given information about Medicaid Managed Care team care coordination services as a part of their [place] Medicaid benefit. Casey Petty verbally consented to engagement with the [facility] Managed Care team.   For questions related to your [facility], please call: [phone] or visit the homepage here: [email or web address]  If you would like to schedule transportation through your [facility], please call the following number at least 2 days in advance of your appointment: [phone].   Call the [place] at [phone], at any time, 24 hours a day, 7 days a week. If you are in danger or need immediate medical attention call 911.  [name]:   Goals Addressed             This Visit's Progress    Monitor and Manage My Blood Sugar-Diabetes Type 2       Timeframe:  Long-Range Goal Priority:  High Start Date:  01/21/21                           Expected End Date:   04/26/21                    Follow Up Date 02/22/21    - work with [place] for food resources - call PCP office with assistance with Continuous Glucose Monitor(Dexcom) once you receive it - eat three small meals a day, adhering to a diabetic diet - call your PCP office to schedule to have ordered labs drawn - work with [facility] Pharmacist, Ovid Curd for medication management - check blood sugar at prescribed times - enter blood sugar readings and medication or insulin into daily log - take the blood sugar meter to all doctor visits    Why is this important?   Checking your blood sugar at home helps to keep it from getting very high or very low.  Writing the results in a diary or log helps the doctor know how to care for you.  Your blood sugar log should have the time, date and the  results.  Also, write down the amount of insulin or other medicine that you take.  Other information, like what you ate, exercise done and how you were feeling, will also be helpful.              Please see education materials related to diabetes provided as print materials.   The patient verbalized understanding of instructions provided today and agreed to receive a mailed copy of patient instruction and/or educational materials.  Telephone follow up appointment with Managed Medicaid care management team member scheduled for:02/22/21 @ [place] RN, [place] RN Care Coordinator   Following is a copy of your plan of care:  Patient Care Plan: Diabetes Type 2 (Adult)     Problem Identified: Glycemic Management (Diabetes, Type 2)      Long-Range Goal: Glycemic Management Optimized   Start Date: 01/21/2021  Expected End Date: 04/26/2021  This Visit's Progress: On track  Recent Progress: On track  Priority: High  Note:   Objective:  Lab Results  Component Value Date   HGBA1C 14.6 (H) 07/13/2020   Lab Results  Component Value Date   CREATININE 1.04 09/01/2020   CREATININE 0.94 07/24/2019   CREATININE 0.93 05/08/2019  No results found for: EGFR Current Barriers:  Knowledge Deficits related to basic Diabetes pathophysiology and Casey Petty care/management-[name] is managing his health after having a stroke in 2020. He has right side hemiparesis, DMII and HTN. His DM is poorly controlled. Patient is unable to check his blood sugar and does not like to be stuck. He is interested in CGM. Patient administers his medications, but has limited understanding of his medications. He is interested in having his prescriptions delivered to his home. Patient reports his wheelchair is not in good shape and needs to be replaced or repaired. He lives with his fiance who helps him with managing his care. Knowledge Deficits related to medications used for  management of diabetes Difficulty obtaining or cannot afford medications Financial Constraints Cognitive Deficits Does not use cbg meter  Unable to independently monitor blood sugar Does not adhere to provider recommendations [id number] blood sugar, maintaining a diabetic diet  Does not adhere to prescribed medication regimen Lacks social connections Does not contact provider office for questions/concerns Case Manager Clinical Goal(s):  patient will demonstrate improved adherence to prescribed treatment plan for diabetes Casey Petty care/management as evidenced by: adherence to ADA/ carb modified diet, adherence to prescribed medication regimen, contacting provider for new or worsened symptoms or questions Interventions:  Inter-disciplinary care team collaboration (see longitudinal plan of care) Provided education to patient about basic DM disease process Discussed new pharmacy, all medications will be delivered to [name] on 7/12 Discussed plans with patient for ongoing care management follow up and provided patient with direct contact information for care management team Referral to Care Guide for assistance with food resources Review of patient status, including review of consultants reports, relevant laboratory and other test results, and medications completed. Collaborated with [employer] regarding broken wheelchair. Per [employer] [name] wheelchair is no longer a rental. He will have to take it to the retail office ([address], [place], [place] [address]) for assessment and repair. Details reviewed with [name] and his fiance Provided information for medical transportation provided by [facility] [phone] Reviewed scheduled appointments for Neurology 7/20 and Botox injection 7/21. [name] has transportation to these appointments Provided examples of healthy food choices and education provided on diabetic diet Casey Petty-Care Activities - Casey Petty administers oral medications as  prescribed Casey Petty administers insulin as prescribed Attends all scheduled provider appointments Adheres to prescribed ADA/carb modified Patient Goals: - work with Care Guide for food resources - call PCP office with assistance with Continuous Glucose Monitor(Dexcom) once you receive it - eat three small meals a day, adhering to a diabetic diet - call your PCP office to schedule to have ordered labs drawn - work with MM Pharmacist, Ovid Curd for medication management - check blood sugar at prescribed times - enter blood sugar readings and medication or insulin into daily log - take the blood sugar meter to all doctor visits  Follow Up Plan: Telephone follow up appointment with care management team member scheduled for:02/22/21 @ 9am     Patient Care Plan: Medication Management     Problem Identified: Health Promotion or Disease Casey Petty-Management (General Plan of Care)      Goal: Medication Management   Note:   Current Barriers:  Unable to independently monitor therapeutic efficacy Unable to achieve control of DM  Does not contact provider office for questions/concerns   Pharmacist Clinical Goal(s):  Over the next 30 days, patient will contact provider office for questions/concerns as evidenced notation of same in electronic health record through collaboration with PharmD  and provider.    Interventions: Inter-disciplinary care team collaboration (see longitudinal plan of care) Comprehensive medication review performed; medication list updated in electronic medical record   Patient Goals/Casey Petty-Care Activities Over the next 30 days, patient will:  - collaborate with provider on medication access solutions  Follow Up Plan: The care management team will reach out to the patient again over the next 30 days.

## 2021-02-05 ENCOUNTER — Telehealth: Payer: Self-pay | Admitting: *Deleted

## 2021-02-05 ENCOUNTER — Other Ambulatory Visit: Payer: Self-pay | Admitting: Pharmacist

## 2021-02-05 DIAGNOSIS — I639 Cerebral infarction, unspecified: Secondary | ICD-10-CM | POA: Diagnosis not present

## 2021-02-05 MED ORDER — ASPIRIN 81 MG PO TBEC: 81.0000 mg | DELAYED_RELEASE_TABLET | Freq: Every day | ORAL | 0 refills | Status: DC

## 2021-02-05 NOTE — Telephone Encounter (Signed)
   Telephone encounter was:  Successful.  02/05/2021 Name: Veronica Guerrant MRN: [id number] DOB: 1960/11/17  Huie Ghuman is a 60 y.o. year old male who is a primary care patient of Charlott Rakes, MD . The community resource team was consulted for assistance with [place] guide performed the following interventions: Patient provided with information about care guide support team and interviewed to confirm resource needs Follow up call placed to community resources to determine status of patients referral.  Follow Up Plan:  No further follow up planned at this time. The patient has been provided with needed resources.  [place], Care Management  [phone] [address] , [place] [address] Email : [email or web address]

## 2021-02-06 DIAGNOSIS — I639 Cerebral infarction, unspecified: Secondary | ICD-10-CM | POA: Diagnosis not present

## 2021-02-08 DIAGNOSIS — I639 Cerebral infarction, unspecified: Secondary | ICD-10-CM | POA: Diagnosis not present

## 2021-02-09 DIAGNOSIS — I639 Cerebral infarction, unspecified: Secondary | ICD-10-CM | POA: Diagnosis not present

## 2021-02-10 ENCOUNTER — Other Ambulatory Visit: Payer: Self-pay

## 2021-02-10 DIAGNOSIS — I639 Cerebral infarction, unspecified: Secondary | ICD-10-CM | POA: Diagnosis not present

## 2021-02-11 DIAGNOSIS — I639 Cerebral infarction, unspecified: Secondary | ICD-10-CM | POA: Diagnosis not present

## 2021-02-12 DIAGNOSIS — I639 Cerebral infarction, unspecified: Secondary | ICD-10-CM | POA: Diagnosis not present

## 2021-02-13 DIAGNOSIS — I639 Cerebral infarction, unspecified: Secondary | ICD-10-CM | POA: Diagnosis not present

## 2021-02-15 DIAGNOSIS — I639 Cerebral infarction, unspecified: Secondary | ICD-10-CM | POA: Diagnosis not present

## 2021-02-16 ENCOUNTER — Other Ambulatory Visit: Payer: Self-pay

## 2021-02-16 DIAGNOSIS — I639 Cerebral infarction, unspecified: Secondary | ICD-10-CM | POA: Diagnosis not present

## 2021-02-17 DIAGNOSIS — I639 Cerebral infarction, unspecified: Secondary | ICD-10-CM | POA: Diagnosis not present

## 2021-02-18 DIAGNOSIS — I639 Cerebral infarction, unspecified: Secondary | ICD-10-CM | POA: Diagnosis not present

## 2021-02-19 DIAGNOSIS — I639 Cerebral infarction, unspecified: Secondary | ICD-10-CM | POA: Diagnosis not present

## 2021-02-20 DIAGNOSIS — I639 Cerebral infarction, unspecified: Secondary | ICD-10-CM | POA: Diagnosis not present

## 2021-02-22 ENCOUNTER — Other Ambulatory Visit: Payer: Self-pay | Admitting: *Deleted

## 2021-02-22 ENCOUNTER — Other Ambulatory Visit: Payer: Self-pay

## 2021-02-22 DIAGNOSIS — I639 Cerebral infarction, unspecified: Secondary | ICD-10-CM | POA: Diagnosis not present

## 2021-02-22 NOTE — Patient Instructions (Signed)
Visit Information  Casey Petty was given information about Medicaid Managed Care team care coordination services as a part of their [place] Medicaid benefit. Casey Petty verbally consented to engagement with the [facility] Managed Care team.   For questions related to your [facility], please call: [phone] or visit the homepage here: [email or web address]  If you would like to schedule transportation through your [facility], please call the following number at least 2 days in advance of your appointment: [phone].   Call the [place] at [phone], at any time, 24 hours a day, 7 days a week. If you are in danger or need immediate medical attention call 911.  If you would like help to quit smoking, call 1-800-QUIT-NOW 8063832172) OR Espaol: 1-855-Djelo-Ya (2-694-854-6270) o para ms informacin haga clic aqu or Text READY to 200-400 to register via text  [name]:   Goals Addressed             This Visit's Progress    Monitor and Manage My Blood Sugar-Diabetes Type 2       Timeframe:  Long-Range Goal Priority:  High Start Date:  01/21/21                           Expected End Date:   04/26/21                    Follow Up Date 03/24/21    - work with Care Guide for food resources - call PCP office with assistance with Continuous Glucose Monitor(Dexcom) once you receive it - eat three small meals a day, adhering to a diabetic diet - call your PCP office to schedule to have ordered labs drawn - work with [facility] Pharmacist, Casey Petty for medication management - check blood sugar at prescribed times - enter blood sugar readings and medication or insulin into daily log - take the blood sugar meter to all doctor visits    Why is this important?   Checking your blood  sugar at home helps to keep it from getting very high or very low.  Writing the results in a diary or log helps the doctor know how to care for you.  Your blood sugar log should have the time, date and the results.  Also, write down the amount of insulin or other medicine that you take.  Other information, like what you ate, exercise done and how you were feeling, will also be helpful.             Please see education materials related to continuous glucose monitor and blood pressure provided as print materials.   The patient verbalized understanding of instructions provided today and agreed to receive a mailed copy of patient instruction and/or educational materials.  Telephone follow up appointment with Managed Medicaid care management team member scheduled for:03/24/21 @ [place] RN, [place] RN Care Coordinator   Following is a copy of your plan of care:  Patient Care Plan: Diabetes Type 2 (Adult)     Problem Identified: Glycemic Management (Diabetes, Type 2)      Long-Range Goal: Glycemic Management Optimized   Start Date: 01/21/2021  Expected End Date: 04/26/2021  This Visit's Progress: On track  Recent Progress: On track  Priority: High  Note:   Objective:  Lab Results  Component Value Date   HGBA1C 14.6 (H) 07/13/2020   Lab Results  Component Value Date   CREATININE 1.04 09/01/2020   CREATININE 0.94 07/24/2019   CREATININE 0.93 05/08/2019   No results found for: EGFR Current Barriers:  Knowledge Deficits related to basic Diabetes pathophysiology and self care/management-Casey Petty is managing his health after having a stroke in 2020. He has right side hemiparesis, DMII and HTN. His DM is poorly controlled. Patient is unable to check his blood sugar and does not like to be stuck. He is interested in CGM. Patient administers his medications, but has limited understanding of his medications. He is interested in having his  prescriptions delivered to his home. Patient reports his wheelchair is not in good shape and needs to be replaced or repaired. He lives with his fiance who helps him with managing his care.-Update-Patient anticipates CGM to be delivered 02/24/21. He reports checking BS twice daily with readings between 100-121. He is eating lean meats with fruits and vegetables. He did receive food resources from [place]. Knowledge Deficits related to medications used for management of diabetes Difficulty obtaining or cannot afford medications Financial Constraints Cognitive Deficits Does not use cbg meter  Unable to independently monitor blood sugar Does not adhere to provider recommendations [id number] blood sugar, maintaining a diabetic diet  Does not adhere to prescribed medication regimen Lacks social connections Does not contact provider office for questions/concerns Case Manager Clinical Goal(s):  patient will demonstrate improved adherence to prescribed treatment plan for diabetes self care/management as evidenced by: adherence to ADA/ carb modified diet, adherence to prescribed medication regimen, contacting provider for new or worsened symptoms or questions Interventions:  Inter-disciplinary care team collaboration (see longitudinal plan of care) Provided education to patient about basic DM disease process and Continuous Glucose Monitor Discussed plans with patient for ongoing care management follow up and provided patient with direct contact information for care management team Review of patient status, including review of consultants reports, relevant laboratory and other test results, and medications completed. Collaborated with [employer] regarding broken wheelchair. Per [employer] Casey Petty wheelchair is no longer a rental. He will have to take it to the retail office ([address], [place], [place] [address]) for assessment and repair. Details reviewed with Casey Petty and his fiance-Reviewed  again. Casey Petty has not taken wheelchair for repair Reviewed scheduled appointments for Neurology 7/20 and Botox injection 7/21. Casey Petty has transportation to these appointments Discussed the pending labs ordered by PCP and the importance of having these drawn. Call the office for lab appointment Self-Care Activities - Self administers oral medications as prescribed Self administers insulin as prescribed Attends all scheduled provider appointments Adheres to prescribed ADA/carb modified Patient Goals: - work with Care Guide for food resources - call PCP office with assistance with Continuous Glucose Monitor(Dexcom) once you receive it - eat three small meals a day, adhering to a diabetic diet - call your PCP office to schedule to have ordered labs drawn - work with [facility] Pharmacist, Casey Petty for medication management - check blood sugar at prescribed times - enter blood sugar readings and medication or insulin into daily log - take the blood sugar meter to all doctor visits  Follow Up Plan: Telephone follow up appointment with care management team member scheduled for:03/24/21 @ 9am     Patient Care Plan: Medication Management     Problem Identified: Health Promotion or Disease Self-Management (General Plan of Care)      Goal: Medication  Management   Note:   Current Barriers:  Unable to independently monitor therapeutic efficacy Unable to achieve control of DM  Does not contact provider office for questions/concerns   Pharmacist Clinical Goal(s):  Over the next 30 days, patient will contact provider office for questions/concerns as evidenced notation of same in electronic health record through collaboration with PharmD and provider.    Interventions: Inter-disciplinary care team collaboration (see longitudinal plan of care) Comprehensive medication review performed; medication list updated in electronic medical record     Patient Goals/Self-Care Activities Over the  next 30 days, patient will:  - collaborate with provider on medication access solutions  Follow Up Plan: The care management team will reach out to the patient again over the next 30 days.

## 2021-02-22 NOTE — Patient Outreach (Signed)
Medicaid Managed Care   Nurse Care Manager Note  02/22/2021 Name:  Casey Petty MRN:  [id number] DOB:  1960-12-01  Casey Petty is an 60 y.o. year old male who is a primary patient of Casey Rakes, MD.  The [facility] Managed Care Coordination team was consulted for assistance with:    HTN DMII  [name] was given information about Medicaid Managed Care Coordination team services today. [name] agreed to services and verbal consent obtained.  Engaged with patient by telephone for follow up visit in response to provider referral for case management and/or care coordination services.   Assessments/Interventions:  Review of past medical history, allergies, medications, health status, including review of consultants reports, laboratory and other test data, was performed as part of comprehensive evaluation and provision of chronic care management services.  SDOH (Social Determinants of Health) assessments and interventions performed: SDOH Interventions    Flowsheet Row Most Recent Value  SDOH Interventions   Food Insecurity Interventions Other (Comment)  [Referral placed for Care Guide assistance]  Housing Interventions Intervention Not Indicated  Transportation Interventions --  [Utilizing SCAT and medical transportation]       Care Plan  Allergies  Allergen Reactions   Lisinopril Swelling    Angioedema- lips/face    Medications Reviewed Today     Reviewed by [name], RN (Registered Nurse) on 01/21/21 at [place] List Status: <None>   Medication Order Taking? Sig Documenting Provider Last Dose Status Informant  acetaminophen (TYLENOL) 325 MG tablet [id number]  Take 1-2 tablets (325-650 mg total) by mouth every 4 (four) hours as needed for mild pain. [name], PA-C  Active            Med Note [name], [name]   Tue Nov 24, 2020 10:30 AM) Prn only  amLODipine (NORVASC) 10 MG tablet [id number] Yes TAKE 1 TABLET (10 MG TOTAL) BY MOUTH DAILY. TO LOWER  BLOOD PRESSURE Casey Rakes, MD Taking Active   aspirin 81 MG EC tablet [id number] No Take 1 tablet (81 mg total) by mouth daily.  Patient not taking: Reported on 01/21/2021   [name], PA-C Not Taking Active   atorvastatin (LIPITOR) 80 MG tablet [id number] Yes TAKE 1 TABLET BY MOUTH IN THE EVENINGS TO LOWER CHOLESTEROL Casey Rakes, MD Taking Active   baclofen (LIORESAL) 20 MG tablet [id number] No TAKE 1.5 TABLETS (30 MG TOTAL) BY MOUTH 3 (THREE) TIMES DAILY.  Patient not taking: Reported on 01/21/2021   [name], MD Not Taking Active            Med Note [name], [name]   Thu Jan 21, 2021  9:24 AM) Needs to pick up prescription  Blood Glucose Monitoring Suppl (TRUE METRIX METER) w/Device KIT [id number]  Check blood sugar fasting and before meals and again if pt feels bad (symptoms of hypo). Fulp, Cammie, MD  Active   carvedilol (COREG) 12.5 MG tablet [id number] Yes TAKE 1 TABLET (12.5 MG TOTAL) BY MOUTH 2 (TWO) TIMES DAILY WITH A MEAL. FOR BLOOD PRESSURE Casey Rakes, MD Taking Active   clotrimazole (LOTRIMIN) 1 % cream [id number] No Apply to affected area 2 times daily for the next 7 days.  Patient not taking: Reported on 01/21/2021   [name], PA-C Not Taking Active   dapagliflozin propanediol (FARXIGA) 10 MG TABS tablet [id number] No TAKE 1 TABLET (10 MG TOTAL) BY MOUTH DAILY BEFORE BREAKFAST.  Patient not taking: Reported on 01/21/2021   Casey Rakes, MD Not Taking Active  Med Note (Kerrin Markman A   Thu Jan 21, 2021  9:25 AM) Needs to pick up prescription  diclofenac Sodium (VOLTAREN) 1 % GEL [id number] No Apply 2 g topically 4 (four) times daily.  Patient not taking: Reported on 01/21/2021   [name], MD Not Taking Active   ezetimibe (ZETIA) 10 MG tablet [id number] No TAKE 1 TABLET (10 MG TOTAL) BY MOUTH DAILY.  Patient not taking: Reported on 01/21/2021   Casey Rakes, MD Not Taking Active            Med Note (Omere Marti A   Thu Jan 21, 2021  9:27 AM) Needs to pick up prescription  ezetimibe (ZETIA) 10 MG tablet [id number] No TAKE 1 TABLET (10 MG TOTAL) BY MOUTH DAILY.  Patient not taking: Reported on 01/21/2021   [name], NP Not Taking Active   famotidine (PEPCID) 20 MG tablet [id number] Yes Take 1 tablet (20 mg total) by mouth daily. [name], PA-C Taking Active            Med Note (Mazy Culton A   Thu Jan 21, 2021  9:22 AM) Taking as needed  glucose blood (TRUE METRIX BLOOD GLUCOSE TEST) test strip [id number] No Use as instructed  Patient not taking: Reported on 01/21/2021   [name], MD Not Taking Active   hydrALAZINE (APRESOLINE) 100 MG tablet [id number] Yes TAKE 1 TABLET (100 MG TOTAL) BY MOUTH EVERY 8 (EIGHT) HOURS. TO LOWER BLOOD PRESSURE Casey Rakes, MD Taking Active   insulin glargine (LANTUS) 100 UNIT/ML Solostar Pen [id number] Yes INJECT 10 UNITS INTO THE SKIN DAILY. Casey Rakes, MD Taking Active   Misc. Devices [place] [id number]  Wheelchair with Accessories: elevating leg rests (ELRs), wheel locks, extensions and anti-tippers. Cane or walker will not suffice Casey Rakes, MD  Active   TRUEplus Lancets 28G MISC [id number] No Check blood sugar fasting and before meals and again if pt feels bad (symptoms of hypo).  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   Abnormality of gait 09/17/2019   Spastic hemiparesis of right dominant side ([place]) 07/02/2019   History of CVA with residual deficit 06/09/2019   Spastic hemiparesis (HCC)    Uncontrolled type 2 diabetes mellitus with hyperglycemia (HCC)    Noncompliance    Benign essential HTN    Newly diagnosed diabetes ([place])    Leucocytosis    Right hemiparesis ([place])    Basal ganglia stroke ([place]) 03/04/2019   Stroke ([place]) 02/28/2019   Essential  hypertension 02/28/2019   Tobacco abuse 02/28/2019   Hyperglycemia 02/28/2019    Conditions to be addressed/monitored per PCP order:  HTN and DMII  Care Plan : Diabetes Type 2 (Adult)  Updates made by [name], RN since 02/22/2021 12:00 AM     Problem: Glycemic Management (Diabetes, Type 2)      Long-Range Goal: Glycemic Management Optimized   Start Date: 01/21/2021  Expected End Date: 04/26/2021  This Visit's Progress: On track  Recent Progress: On track  Priority: High  Note:   Objective:  Lab Results  Component Value Date   HGBA1C 14.6 (H) 07/13/2020   Lab Results  Component Value Date   CREATININE 1.04 09/01/2020   CREATININE 0.94 07/24/2019  CREATININE 0.93 05/08/2019   No results found for: EGFR Current Barriers:  Knowledge Deficits related to basic Diabetes pathophysiology and self care/management-Mr. Looper is managing his health after having a stroke in 2020. He has right side hemiparesis, DMII and HTN. His DM is poorly controlled. Patient is unable to check his blood sugar and does not like to be stuck. He is interested in CGM. Patient administers his medications, but has limited understanding of his medications. He is interested in having his prescriptions delivered to his home. Patient reports his wheelchair is not in good shape and needs to be replaced or repaired. He lives with his fiance who helps him with managing his care.-Update-Patient anticipates CGM to be delivered 02/24/21. He reports checking BS twice daily with readings between 100-121. He is eating lean meats with fruits and vegetables. He did receive food resources from [place]. Knowledge Deficits related to medications used for management of diabetes Difficulty obtaining or cannot afford medications Financial Constraints Cognitive Deficits Does not use cbg meter  Unable to independently monitor blood sugar Does not adhere to provider recommendations [id number] blood sugar, maintaining a  diabetic diet  Does not adhere to prescribed medication regimen Lacks social connections Does not contact provider office for questions/concerns Case Manager Clinical Goal(s):  patient will demonstrate improved adherence to prescribed treatment plan for diabetes self care/management as evidenced by: adherence to ADA/ carb modified diet, adherence to prescribed medication regimen, contacting provider for new or worsened symptoms or questions Interventions:  Inter-disciplinary care team collaboration (see longitudinal plan of care) Provided education to patient about basic DM disease process and Continuous Glucose Monitor Discussed plans with patient for ongoing care management follow up and provided patient with direct contact information for care management team Review of patient status, including review of consultants reports, relevant laboratory and other test results, and medications completed. Collaborated with [employer] regarding broken wheelchair. Per [employer] Mr. Hallums wheelchair is no longer a rental. He will have to take it to the retail office ([address] [place], [place] [address]) for assessment and repair. Details reviewed with Mr. Birman and his fiance-Reviewed again. Mr. Belcastro has not taken wheelchair for repair Reviewed scheduled appointments for Neurology 7/20 and Botox injection 7/21. Mr. Carrero has transportation to these appointments Discussed the pending labs ordered by PCP and the importance of having these drawn. Call the office for lab appointment Self-Care Activities - Self administers oral medications as prescribed Self administers insulin as prescribed Attends all scheduled provider appointments Adheres to prescribed ADA/carb modified Patient Goals: - work with Care Guide for food resources - call PCP office with assistance with Continuous Glucose Monitor(Dexcom) once you receive it - eat three small meals a day, adhering to a diabetic diet - call your PCP  office to schedule to have ordered labs drawn - work with MM Pharmacist, Ovid Curd for medication management - check blood sugar at prescribed times - enter blood sugar readings and medication or insulin into daily log - take the blood sugar meter to all doctor visits  Follow Up Plan: Telephone follow up appointment with care management team member scheduled for:03/24/21 @ 9am      Follow Up:  Patient agrees to Care Plan and Follow-up.  Plan: The Managed Medicaid care management team will reach out to the patient again over the next 30 days.  Date/time of next scheduled RN care management/care coordination outreach:  03/24/21 @ [place] RN, [place] RN Care Coordinator

## 2021-02-23 ENCOUNTER — Other Ambulatory Visit: Payer: Self-pay

## 2021-02-23 DIAGNOSIS — I639 Cerebral infarction, unspecified: Secondary | ICD-10-CM | POA: Diagnosis not present

## 2021-02-23 NOTE — Patient Outreach (Signed)
Dexcom requires PA. Asked Dr. Margarita Rana to send in TS/Lancets for True Metrix Meter 4x/day to last until approved

## 2021-02-23 NOTE — Progress Notes (Signed)
[facility] [address]. [place] [address] [phone]       OFFICE FOLLOW UP NOTE  [name] Date of Birth:  October 09, 1960 Medical Record Number:  [id number]   Reason for Referral: stroke follow up    CHIEF COMPLAINT:  Chief Complaint  Patient presents with   Follow-up    Rm 3 with Fiance, reports he has been doing well- No concerns noted today.denies any falls.      HPI:  Today, 02/23/2021, [name] returns for stroke follow-up after prior visit approximately 7 months ago.  He is accompanied by his fiance.  Stable since prior visit without new stroke/TIA symptoms.  Residual right spastic hemiparesis and dysarthria -reports improvement since prior visit.  He continues to follow with Dr. Posey Pronto PMR for spasticity management including Botox and baclofen. Improvement of shoulder pain after injection. He completed therapies in 12/2020 and reports continuously doing exercises at home. Continues to ambulate with a cane and AFO brace - no recent falls.  Reports compliance on aspirin, atorvastatin and Zetia without associated side effects.  Blood pressure today 119/74.  He is not currently monitoring BG at home - plans on obtaining Freestyle to monitor.  Prior A1c 14.6 (07/2020) up from 12.2. no new concerns at this time.     History provided for reference purposes only Update 07/13/2020 JM [name] returns for 48-month stroke follow-up.  Residual right spastic hemiparesis, R adhesive capsulitis and dysarthria stable.  Continue Botox by Dr. Posey Pronto with plans on repeating next month as well as ongoing use of baclofen. Scheduled shoulder injection with Dr. Posey Pronto for adhesive capsulitis 12/9. Denies new stroke/TIA symptoms. Questionable compliance with prescribed medications as pt and wife both look confused when asked about taking specific medications such as baclofen, atorvastatin and Zetia but then will eventually say he is taking. Reports use of aspirin 81  mg daily and atorvastatin 80 mg daily for secondary stroke prevention.  Prior lipid panel 01/2020 showed LDL 149 therefore Zetia added in addition to atorvastatin.  Prior A1c 01/2020 12.2 (up from 8.4 6 mo prior) despite reported compliance of Jardiance. Patient reports monitoring BG twice daily typically 125 (unsure accuracy). He has not had recent follow-up with PCP.  Blood pressure today 126/74. Monitors at home and typically stable. No concerns at this time.  Update 01/23/2020 JM: [name] is being seen for stroke follow-up accompanied by his fiance.  Residual deficits of right spastic hemiparesis and dysarthria.  Continues to follow with PMR Dr. Posey Pronto for Botox injections and ongoing use of baclofen as well as management of right shoulder adhesive capsulitis. He does report benefit with use of botox. Completed approved Medicaid visits with PT and OT with great benefit and is ambulating with a cane and w/c for long distance. Also continues to use AFO brace. Continues on aspirin and atorvastatin for secondary stroke prevention. He has not had recent lab work by PCP. Blood pressure today 140/79.  No concerns at this time.  Update 07/25/2019: [name] is a 60 year old male who is being seen today for stroke follow-up.  Residual deficits of right spastic hemiparesis, mild right facial droop and dysarthria.  He does endorse ongoing improvement.  Continues to follow with physical medicine rehab Dr. Posey Pronto with plans on receiving Botox for ongoing spasticity.  He also had baclofen dosage increased to 20 mg 3 times daily.  He has not received any therapies as previously Medicaid pending.  He primarily uses wheelchair for transfers but has  been walking very short distances with assistance of his wife.  Previously using AFO brace but not currently in place with patient stating "I want to try something different".  Continues on aspirin and atorvastatin for secondary stroke prevention without side effects.  Prior  lipid panel on 05/08/2019 showed LDL 57.  Blood pressure today 140/90.  Blood work obtained yesterday by PCP Dr. Chapman Fitch with A1c 8.4.  Continues to follow with PCP actively for ongoing management.  Denies new or worsening stroke/TIA symptoms.  Initial visit 05/01/2019: [name] is being seen today for hospital follow-up accompanied by his fiance.  Residual deficits of right hemiparesis and mild dysarthria with mild right facial droop.  He is not currently receiving any therapies as he is Medicaid pending.  He does continue to do exercises at home with ongoing improvement.  He does ambulate without assistive device but does use wheelchair for long distance.  Currently has AFO brace in place for right ankle weakness.  Completed 3 weeks DAPT and continues on aspirin alone without bleeding or bruising.  Continues on atorvastatin 80 mg daily without myalgias.  Blood pressure today 125/71.  Glucose levels have been stable with ongoing use of metformin.  Continues to follow with PCP for HTN, HLD and DM management.  Denies new or worsening stroke/TIA symptoms.  Stroke admission 02/28/2019: [name] is a 60 y.o. male with history of hypertension  who presented [facility] ED on 02/28/2019 with right sided weakness and gait difficulties. He did not receive IV t-PA due to late presentation (>4.5 hours from time of onset).  Stroke work-up revealed left BG small infarct as evidenced on MRI likely secondary to small vessel disease source with resultant dysarthria and right hemiparesis.  TCD and carotid Doppler unremarkable.  2D echo normal EF.  Recommended DAPT for 3 weeks and aspirin alone. Hx of HTN with long-term BP goal normotensive range and discharged on home dose amlodipine and initiated lisinopril.  Initiated atorvastatin 80 mg daily for HLD management.  Current tobacco use with smoking cessation counseling provided.  Uncontrolled DM with A1c 7.9.  Other stroke risk factors include EtOH use but no prior history of  stroke.  He was discharged to [facility] for ongoing therapy and discharged home on 03/20/2019.        ROS:   14 system review of systems performed and negative with exception of those listed in HPI  PMH:  Past Medical History:  Diagnosis Date   Hypertension     PSH:  Past Surgical History:  Procedure Laterality Date   NO PAST SURGERIES      Social History:  Social History   Socioeconomic History   Marital status: Single    Spouse name: Not on file   Number of children: Not on file   Years of education: Not on file   Highest education level: Not on file  Occupational History   Not on file  Tobacco Use   Smoking status: Every Day   Smokeless tobacco: Never   Tobacco comments:    Smoking 4 cigs/day  Vaping Use   Vaping Use: Never used  Substance and Sexual Activity   Alcohol use: Yes    Alcohol/week: 2.0 standard drinks    Types: 2 Standard drinks or equivalent per week   Drug use: No   Sexual activity: Never  Other Topics Concern   Not on file  Social History Narrative   Not on file   Social Determinants of Health   Financial  Resource Strain: Not on file  Food Insecurity: Food Insecurity Present   Worried About Charity fundraiser in the Last Year: Sometimes true   Ran Out of Food in the Last Year: Sometimes true  Transportation Needs: No Transportation Needs   Lack of Transportation (Medical): No   Lack of Transportation (Non-Medical): No  Physical Activity: Not on file  Stress: Not on file  Social Connections: Not on file  Intimate Partner Violence: Not on file    Family History:  Family History  Problem Relation Age of Onset   Heart attack Father    Cancer Neg Hx     Medications:   Current Outpatient Medications on File Prior to Visit  Medication Sig Dispense Refill   acetaminophen (TYLENOL) 325 MG tablet Take 1-2 tablets (325-650 mg total) by mouth every 4 (four) hours as needed for mild pain.     amLODipine (NORVASC) 10 MG tablet TAKE 1 TABLET  (10 MG TOTAL) BY MOUTH DAILY. TO LOWER BLOOD PRESSURE 90 tablet 1   aspirin 81 MG EC tablet Take 1 tablet (81 mg total) by mouth daily. 100 tablet 0   atorvastatin (LIPITOR) 80 MG tablet TAKE 1 TABLET BY MOUTH IN THE EVENINGS TO LOWER CHOLESTEROL 90 tablet 1   baclofen (LIORESAL) 20 MG tablet TAKE 1.5 TABLETS (30 MG TOTAL) BY MOUTH 3 (THREE) TIMES DAILY. 90 tablet 3   Blood Glucose Monitoring Suppl (TRUE METRIX METER) w/Device KIT Check blood sugar fasting and before meals and again if pt feels bad (symptoms of hypo). 1 kit 0   carvedilol (COREG) 12.5 MG tablet TAKE 1 TABLET (12.5 MG TOTAL) BY MOUTH 2 (TWO) TIMES DAILY WITH A MEAL. FOR BLOOD PRESSURE 180 tablet 1   clotrimazole (LOTRIMIN) 1 % cream Apply to affected area 2 times daily for the next 7 days. 15 g 0   Continuous Blood Gluc Receiver (DEXCOM G6 RECEIVER) DEVI Use to check blood sugar TID. 1 each 0   Continuous Blood Gluc Sensor (DEXCOM G6 SENSOR) MISC Use to check blood sugar TID. 3 each 3   Continuous Blood Gluc Transmit (DEXCOM G6 TRANSMITTER) MISC Use to check blood sugar TID. 1 each 3   dapagliflozin propanediol (FARXIGA) 10 MG TABS tablet TAKE 1 TABLET (10 MG TOTAL) BY MOUTH DAILY BEFORE BREAKFAST. 90 tablet 1   diclofenac Sodium (VOLTAREN) 1 % GEL Apply 2 g topically 4 (four) times daily. 350 g 1   ezetimibe (ZETIA) 10 MG tablet TAKE 1 TABLET (10 MG TOTAL) BY MOUTH DAILY. 90 tablet 3   ezetimibe (ZETIA) 10 MG tablet TAKE 1 TABLET (10 MG TOTAL) BY MOUTH DAILY. 90 tablet 1   famotidine (PEPCID) 20 MG tablet Take 1 tablet (20 mg total) by mouth daily. 30 tablet 0   glucose blood (TRUE METRIX BLOOD GLUCOSE TEST) test strip Use as instructed 100 each 12   hydrALAZINE (APRESOLINE) 100 MG tablet TAKE 1 TABLET (100 MG TOTAL) BY MOUTH EVERY 8 (EIGHT) HOURS. TO LOWER BLOOD PRESSURE 270 tablet 1   insulin glargine (LANTUS) 100 UNIT/ML Solostar Pen INJECT 10 UNITS INTO THE SKIN DAILY. 30 mL 6   Insulin Pen Needle (TRUEPLUS PEN NEEDLES) 32G X 4  MM MISC Use to inject insulin. 100 each 2   Misc. Devices MISC Wheelchair with Accessories: elevating leg rests (ELRs), wheel locks, extensions and anti-tippers. Cane or walker will not suffice 1 each 0   TRUEplus Lancets 28G MISC Check blood sugar fasting and before meals and again if pt  feels bad (symptoms of hypo). 100 each 12   No current facility-administered medications on file prior to visit.    Allergies:   Allergies  Allergen Reactions   Lisinopril Swelling    Angioedema- lips/face     Physical Exam  Vitals:   02/24/21 0748  BP: 119/74  Pulse: 75  Weight: 155 lb 6 oz (70.5 kg)  Height: $Remove'5\' 7"'TyEIyFt$  (1.702 m)    Body mass index is 24.34 kg/m. No results found.  General: well developed, well nourished, pleasant middle-age African-American male, seated, in no evident distress Head: head normocephalic and atraumatic.   Neck: supple with no carotid or supraclavicular bruits Cardiovascular: regular rate and rhythm, no murmurs Musculoskeletal: no deformity Skin:  no rash/petichiae Vascular:  Normal pulses all extremities   Neurologic Exam Mental Status: Awake and fully alert.  Mild dysarthria. Oriented to place and time. Recent and remote memory intact. Attention span, concentration and fund of knowledge mostly appropriate. Mood and affect appropriate.  Cranial Nerves: Pupils equal, briskly reactive to light. Extraocular movements full without nystagmus. Visual fields full to confrontation. Hearing intact. Facial sensation intact.  Very slight right nasolabial fold flattening Motor: RUE: 4/5 with decreased grip strength and spasticity RLE: 4/5 hip flexor, knee flexion and extension; 0/5 ankle dorsiflexion with AFO in place Full strength left upper and lower extremity Sensory.: intact to touch , pinprick , position and vibratory sensation.  Coordination: Rapid alternating movements normal on left side. Finger-to-nose and heel-to-shin performed accurately on left side. Gait and  Station: stands from seated position without difficulty.  Gait abnormality with hemiplegic gait and use of cane and AFO brace. Tandem walk and heel toe not attempted. Reflexes: Brisk right upper and lower extremity ; 1+ left upper and lower extremity. Toes downgoing.      ASSESSMENT: Casey Petty is a 60 y.o. year old male presented with right-sided weakness and gait difficulties on 02/28/2019 with stroke work-up revealing left BG small infarct secondary to small vessel disease. Vascular risk factors include HTN, HLD and tobacco use.      PLAN:  Left BG infarct:  Residual stroke deficits: Right spastic hemiparesis and dysarthria.  Continue to follow with PMR for spasticity monitoring and management. Continue to do HEP as advised during therapy. Use of cane and AFO brace for fall prevention Continue aspirin 81 mg daily  and atorvastatin and Zetia for secondary stroke prevention.  Discussed secondary stroke prevention measures and importance of close PCP follow-up for aggressive stroke risk factor management HTN: BP goal<130/90.  Stable on current regimen per PCP HLD: LDL goal<70.  LDL 46 (07/2018) on atorvastatin and Zetia monitored by PCP - repeat lipid panel today DM: A1c goal<7.0.  A1c 14.6 (07/2020) up from 12.2 (01/2020) on Lantus (added by PCP after 07/2020 A1c check) Jardiance per PCP.  Repeat A1c today.     Follow up in 6 months or call earlier if needed   CC: Charlott Rakes, MD Antony Contras, MD    I spent 31 minutes of face-to-face and non-face-to-face time with patient and fianc.  This included previsit chart review, lab review, study review, order entry, electronic health record documentation, patient and fianc education and discussion regarding history of stroke, secondary stroke prevention measures and agreesive stroke risk factor management, residual deficits, spasticity and therapy and answered all other questions to patient and fianc satisfaction   [name],  [facility]  [facility] [address] [place], [place] [address]  Phone [phone] Fax [phone] Note: This  document was prepared with digital dictation and possible smart phrase technology. Any transcriptional errors that result from this process are unintentional.

## 2021-02-24 ENCOUNTER — Encounter: Payer: Self-pay | Admitting: Adult Health

## 2021-02-24 ENCOUNTER — Ambulatory Visit: Payer: Medicaid Other | Admitting: Adult Health

## 2021-02-24 ENCOUNTER — Other Ambulatory Visit: Payer: Self-pay | Admitting: Family Medicine

## 2021-02-24 ENCOUNTER — Other Ambulatory Visit: Payer: Self-pay

## 2021-02-24 VITALS — BP 119/74 | HR 75 | Ht 67.0 in | Wt 155.4 lb

## 2021-02-24 DIAGNOSIS — E119 Type 2 diabetes mellitus without complications: Secondary | ICD-10-CM | POA: Diagnosis not present

## 2021-02-24 DIAGNOSIS — I639 Cerebral infarction, unspecified: Secondary | ICD-10-CM | POA: Diagnosis not present

## 2021-02-24 DIAGNOSIS — E1165 Type 2 diabetes mellitus with hyperglycemia: Secondary | ICD-10-CM | POA: Diagnosis not present

## 2021-02-24 DIAGNOSIS — G8111 Spastic hemiplegia affecting right dominant side: Secondary | ICD-10-CM

## 2021-02-24 DIAGNOSIS — I10 Essential (primary) hypertension: Secondary | ICD-10-CM | POA: Diagnosis not present

## 2021-02-24 DIAGNOSIS — I6381 Other cerebral infarction due to occlusion or stenosis of small artery: Secondary | ICD-10-CM

## 2021-02-24 DIAGNOSIS — E785 Hyperlipidemia, unspecified: Secondary | ICD-10-CM

## 2021-02-24 MED ORDER — TRUE METRIX BLOOD GLUCOSE TEST VI STRP: ORAL_STRIP | 12 refills | Status: AC

## 2021-02-24 MED ORDER — TRUEPLUS LANCETS 28G MISC: 12 refills | Status: AC

## 2021-02-24 NOTE — Patient Instructions (Addendum)
Continue aspirin 81 mg daily  and atorvastatin and Zetia for secondary stroke prevention  Continue to follow up with PCP regarding cholesterol, blood pressure and diabetes management  Maintain strict control of hypertension with blood pressure goal below 130/90, diabetes with hemoglobin A1c goal below 7% and cholesterol with LDL cholesterol (bad cholesterol) goal below 70 mg/dL.   We will check your A1c and cholesterol levels today  Continue to follow with Dr. Posey Pronto for monitoring and management of spasticity   Reports routinely doing exercises at home reports routinely doing exercises at home and continuously continuously continuously   Followup in the future with me in 6 months or call earlier if needed       Thank you for coming to see [place] at [facility]. I hope we have been able to provide you high quality care today.  You may receive a patient satisfaction survey over the next few weeks. We would appreciate your feedback and comments so that we may continue to improve ourselves and the health of our patients.

## 2021-02-24 NOTE — Progress Notes (Signed)
I agree with the above plan 

## 2021-02-25 ENCOUNTER — Other Ambulatory Visit: Payer: Self-pay

## 2021-02-25 ENCOUNTER — Encounter: Payer: Medicaid Other | Attending: Registered Nurse | Admitting: Physical Medicine & Rehabilitation

## 2021-02-25 ENCOUNTER — Telehealth: Payer: Self-pay

## 2021-02-25 ENCOUNTER — Encounter: Payer: Self-pay | Admitting: Physical Medicine & Rehabilitation

## 2021-02-25 VITALS — BP 140/79 | HR 74 | Temp 98.1°F | Ht 67.0 in | Wt 155.4 lb

## 2021-02-25 DIAGNOSIS — G8111 Spastic hemiplegia affecting right dominant side: Secondary | ICD-10-CM | POA: Insufficient documentation

## 2021-02-25 LAB — HEMOGLOBIN A1C
Est. average glucose Bld gHb Est-mCnc: 160 mg/dL
Hgb A1c MFr Bld: 7.2 % — ABNORMAL HIGH (ref 4.8–5.6)

## 2021-02-25 LAB — LIPID PANEL
Chol/HDL Ratio: 3.8 ratio (ref 0.0–5.0)
Cholesterol, Total: 145 mg/dL (ref 100–199)
HDL: 38 mg/dL — ABNORMAL LOW (ref >39)
LDL Chol Calc (NIH): 90 mg/dL (ref 0–99)
Triglycerides: 88 mg/dL (ref 0–149)
VLDL Cholesterol Cal: 17 mg/dL (ref 5–40)

## 2021-02-25 NOTE — Telephone Encounter (Signed)
I called pt. No answer, left a message asking pt to call me back.   

## 2021-02-25 NOTE — Telephone Encounter (Signed)
-----   Message from [name], NP sent at 02/25/2021 11:20 AM EDT ----- Please advise patient/fianc that recent lab work showed improvement of A1c at 7.2 down from 14.6.  Cholesterol levels showed increase in LDL bad cholesterol at 90 previously at 46 with goal less than 70.  He did endorse compliance with atorvastatin and Zetia yesterday's visit but please verify -if compliant, will need to change atorvastatin to Crestor 40 mg daily and to follow-up with PCP in the next 2 to 3 months for repeat lipid panel and ongoing management

## 2021-02-25 NOTE — Patient Instructions (Signed)

## 2021-02-25 NOTE — Patient Outreach (Signed)
Per Pharmacy, his insurance doesn't cover TS/Lancets for the brand meter her has. Will ask PCP for new script for new meter

## 2021-02-25 NOTE — Progress Notes (Signed)
Dysport Injection for spasticity using needle EMG guidance  Dilution: 200 Units/ml Indication: Severe spasticity which interferes with ADL,mobility and/or  hygiene and is unresponsive to medication management and other conservative care Informed consent was obtained after describing risks and benefits of the procedure with the patient. This includes bleeding, bruising, infection, excessive weakness, or medication side effects. A REMS form is on file and signed. Needle: 25g 2" needle electrode Number of units per muscle        Right    Pec Major:      100 units                                                                          Biceps:            200    Brachioradialis 100                                     FCR                 300                                     FCU                 200                                     FDS                 200                                     FDP                 100                           Right    Soleus 100 All injections were done after obtaining appropriate EMG activity and after negative drawback for blood. The patient tolerated the procedure well. Post procedure instructions were given. A followup appointment was made.

## 2021-02-26 ENCOUNTER — Other Ambulatory Visit: Payer: Self-pay

## 2021-02-26 DIAGNOSIS — I639 Cerebral infarction, unspecified: Secondary | ICD-10-CM | POA: Diagnosis not present

## 2021-02-26 MED ORDER — ROSUVASTATIN CALCIUM 40 MG PO TABS
40.0000 mg | ORAL_TABLET | Freq: Every day | ORAL | 4 refills | Status: DC
  Filled 2021-02-26: qty 30, 30d supply, fill #0
  Filled 2021-03-24: qty 30, 30d supply, fill #1

## 2021-02-26 NOTE — Telephone Encounter (Signed)
I called the pt and we discussed results and pt verbalized understanding.   Pt stated again he is complaint with his cholesterol meds ( Zetia and Atorvastatin).   Pt was was advised per Janett Billow, NP we will d/c Atorvastatin and start Crestor 40 mg 1 tablet daily.  Pt agreeable to this plan. Crestor 40 mg 1 tablet daily rx'd to the [facility].  Pt advised to cb if he had any issues picking his medications up.

## 2021-02-26 NOTE — Patient Outreach (Signed)
Patient called to make sure he was still getting delivery of his pills today. Coordinated with [facility] to make sure they go out before end of the day

## 2021-02-26 NOTE — Addendum Note (Signed)
Addended by: Verlin Grills on: 02/26/2021 11:49 AM   Modules accepted: Orders

## 2021-02-27 DIAGNOSIS — I639 Cerebral infarction, unspecified: Secondary | ICD-10-CM | POA: Diagnosis not present

## 2021-03-01 ENCOUNTER — Other Ambulatory Visit: Payer: Self-pay | Admitting: Family Medicine

## 2021-03-01 ENCOUNTER — Other Ambulatory Visit: Payer: Self-pay

## 2021-03-01 DIAGNOSIS — I639 Cerebral infarction, unspecified: Secondary | ICD-10-CM | POA: Diagnosis not present

## 2021-03-01 MED ORDER — ACCU-CHEK GUIDE VI STRP: ORAL_STRIP | 12 refills | Status: AC

## 2021-03-01 MED ORDER — ACCU-CHEK GUIDE W/DEVICE KIT: 1.0000 | PACK | Freq: Four times a day (QID) | 0 refills | Status: AC

## 2021-03-01 NOTE — Patient Outreach (Signed)
Per [facility], Accu-Chek Guide TS able to be processed, will ask PCP for that brand script meter and TS specifically, hopefully they will go through

## 2021-03-02 ENCOUNTER — Other Ambulatory Visit: Payer: Self-pay

## 2021-03-02 ENCOUNTER — Telehealth: Payer: Self-pay

## 2021-03-02 DIAGNOSIS — I639 Cerebral infarction, unspecified: Secondary | ICD-10-CM | POA: Diagnosis not present

## 2021-03-02 NOTE — Telephone Encounter (Signed)
PA FOR DEXCOM PRODUCTS DENIED. COVERED IF PATIENT REQUIRES 2 OR MORE INSULIN INJECTIONS DAILY; INSULIN REGIMEN REQUIRES FREQUENT ADJUSTMENT (BASED ON STANDARD BLOOD GLUCOSE MONITOR OR NON-THERAPEUTIC CONTINUOUS GLUCOSE MONITOR TESTING)

## 2021-03-03 DIAGNOSIS — I639 Cerebral infarction, unspecified: Secondary | ICD-10-CM | POA: Diagnosis not present

## 2021-03-04 DIAGNOSIS — I639 Cerebral infarction, unspecified: Secondary | ICD-10-CM | POA: Diagnosis not present

## 2021-03-05 DIAGNOSIS — I639 Cerebral infarction, unspecified: Secondary | ICD-10-CM | POA: Diagnosis not present

## 2021-03-06 DIAGNOSIS — I639 Cerebral infarction, unspecified: Secondary | ICD-10-CM | POA: Diagnosis not present

## 2021-03-08 DIAGNOSIS — I639 Cerebral infarction, unspecified: Secondary | ICD-10-CM | POA: Diagnosis not present

## 2021-03-09 DIAGNOSIS — I639 Cerebral infarction, unspecified: Secondary | ICD-10-CM | POA: Diagnosis not present

## 2021-03-10 DIAGNOSIS — I639 Cerebral infarction, unspecified: Secondary | ICD-10-CM | POA: Diagnosis not present

## 2021-03-11 DIAGNOSIS — I639 Cerebral infarction, unspecified: Secondary | ICD-10-CM | POA: Diagnosis not present

## 2021-03-12 DIAGNOSIS — I639 Cerebral infarction, unspecified: Secondary | ICD-10-CM | POA: Diagnosis not present

## 2021-03-13 DIAGNOSIS — I639 Cerebral infarction, unspecified: Secondary | ICD-10-CM | POA: Diagnosis not present

## 2021-03-15 DIAGNOSIS — I639 Cerebral infarction, unspecified: Secondary | ICD-10-CM | POA: Diagnosis not present

## 2021-03-16 DIAGNOSIS — I639 Cerebral infarction, unspecified: Secondary | ICD-10-CM | POA: Diagnosis not present

## 2021-03-17 DIAGNOSIS — I639 Cerebral infarction, unspecified: Secondary | ICD-10-CM | POA: Diagnosis not present

## 2021-03-18 DIAGNOSIS — I639 Cerebral infarction, unspecified: Secondary | ICD-10-CM | POA: Diagnosis not present

## 2021-03-19 DIAGNOSIS — I639 Cerebral infarction, unspecified: Secondary | ICD-10-CM | POA: Diagnosis not present

## 2021-03-20 DIAGNOSIS — I639 Cerebral infarction, unspecified: Secondary | ICD-10-CM | POA: Diagnosis not present

## 2021-03-22 DIAGNOSIS — I639 Cerebral infarction, unspecified: Secondary | ICD-10-CM | POA: Diagnosis not present

## 2021-03-23 DIAGNOSIS — I639 Cerebral infarction, unspecified: Secondary | ICD-10-CM | POA: Diagnosis not present

## 2021-03-24 ENCOUNTER — Other Ambulatory Visit: Payer: Self-pay

## 2021-03-24 ENCOUNTER — Other Ambulatory Visit: Payer: Self-pay | Admitting: Family Medicine

## 2021-03-24 ENCOUNTER — Other Ambulatory Visit: Payer: Self-pay | Admitting: *Deleted

## 2021-03-24 DIAGNOSIS — I639 Cerebral infarction, unspecified: Secondary | ICD-10-CM | POA: Diagnosis not present

## 2021-03-24 NOTE — Patient Outreach (Signed)
Medicaid Managed Care   Nurse Care Manager Note  03/24/2021 Name:  Casey Petty MRN:  [id number] DOB:  [birth year]  Casey Petty is an 60 y.o. year old male who is a primary patient of Casey Rakes, MD.  The [facility] Managed Care Coordination team was consulted for assistance with:    Hx Stroke, DMII  [name] was given information about Medicaid Managed Care Coordination team services today. Casey Petty Patient agreed to services and verbal consent obtained.  Engaged with patient by telephone for follow up visit in response to provider referral for case management and/or care coordination services.   Assessments/Interventions:  Review of past medical history, allergies, medications, health status, including review of consultants reports, laboratory and other test data, was performed as part of comprehensive evaluation and provision of chronic care management services.  SDOH (Social Determinants of Health) assessments and interventions performed:   Care Plan  Allergies  Allergen Reactions   Lisinopril Swelling    Angioedema- lips/face    Medications Reviewed Today     Reviewed by Casey Montane, RN (Registered Nurse) on 03/24/21 at [id number]  Med List Status: <None>   Medication Order Taking? Sig Documenting Provider Last Dose Status Informant  acetaminophen (TYLENOL) 325 MG tablet [id number] No Take 1-2 tablets (325-650 mg total) by mouth every 4 (four) hours as needed for mild pain.  Patient not taking: Reported on 03/24/2021   Casey Leriche, PA-C Not Taking Active            Med Note Casey Petty, Casey Petty   Tue Nov 24, 2020 10:30 AM) Prn only  amLODipine (NORVASC) 10 MG tablet [id number] Yes TAKE 1 TABLET (10 MG TOTAL) BY MOUTH DAILY. TO LOWER BLOOD PRESSURE Casey Rakes, MD Taking Active   aspirin 81 MG EC tablet [id number] No Take 1 tablet (81 mg total) by mouth daily.  Patient not taking: Reported on 03/24/2021   Casey Rakes, MD Not Taking Active   baclofen  (LIORESAL) 20 MG tablet [id number] Yes TAKE 1.5 TABLETS (30 MG TOTAL) BY MOUTH 3 (THREE) TIMES DAILY. Casey Petty, Casey Salk, MD Taking Active   Blood Glucose Monitoring Suppl (ACCU-CHEK GUIDE) w/Device KIT [id number] Yes 1 each by Does not apply route in the morning, at noon, in the evening, and at bedtime. Casey Rakes, MD Taking Active   Blood Glucose Monitoring Suppl (TRUE METRIX METER) w/Device KIT [id number] No Check blood sugar fasting and before meals and again if pt feels bad (symptoms of hypo).  Patient not taking: Reported on 03/24/2021   Casey Blackbird, MD Not Taking Active   carvedilol (COREG) 12.5 MG tablet [id number] Yes TAKE 1 TABLET (12.5 MG TOTAL) BY MOUTH 2 (TWO) TIMES DAILY WITH A MEAL. FOR BLOOD PRESSURE Casey Rakes, MD Taking Active   clotrimazole (LOTRIMIN) 1 % cream [id number] No Apply to affected area 2 times daily for the next 7 days.  Patient not taking: Reported on 03/24/2021   Casey Fitting, PA-C Not Taking Active   Continuous Blood Gluc Receiver ([place]) DEVI [id number] No Use to check blood sugar TID.  Patient not taking: Reported on 03/24/2021   Casey Rakes, MD Not Taking Active   Continuous Blood Gluc Sensor (DEXCOM G6 SENSOR) MISC [id number] No Use to check blood sugar TID.  Patient not taking: Reported on 03/24/2021   Casey Rakes, MD Not Taking Active   Continuous Blood Gluc Transmit (DEXCOM G6 TRANSMITTER) MISC [id number] No Use to check blood sugar TID.  Patient not taking: Reported on 03/24/2021  Casey Rakes, MD Not Taking Active   dapagliflozin propanediol (FARXIGA) 10 MG TABS tablet [id number] Yes TAKE 1 TABLET (10 MG TOTAL) BY MOUTH DAILY BEFORE BREAKFAST. Casey Rakes, MD Taking Active   diclofenac Sodium (VOLTAREN) 1 % GEL [id number] No Apply 2 g topically 4 (four) times daily.  Patient not taking: Reported on 03/24/2021   Casey Arn, MD Not Taking Active   ezetimibe (ZETIA) 10 MG tablet [id number] Yes TAKE 1 TABLET (10 MG TOTAL) BY  MOUTH DAILY. Casey Rakes, MD Taking Active   ezetimibe (ZETIA) 10 MG tablet [id number]  TAKE 1 TABLET (10 MG TOTAL) BY MOUTH DAILY. Casey Rakes, MD  Active            Med Note (Casey Petty A   Thu Jan 21, 2021  9:27 AM) Needs to pick up prescription  famotidine (PEPCID) 20 MG tablet [id number] Yes Take 1 tablet (20 mg total) by mouth daily. Casey Rakes, MD Taking Active   glucose blood (ACCU-CHEK GUIDE) test strip [id number] Yes Use as instructed up to 4 times daily Casey Rakes, MD Taking Active   glucose blood (TRUE METRIX BLOOD GLUCOSE TEST) test strip [id number] No Use as instructed before every meal nightly up to 4 times daily  Patient not taking: Reported on 03/24/2021   Casey Rakes, MD Not Taking Active   hydrALAZINE (APRESOLINE) 100 MG tablet [id number] Yes TAKE 1 TABLET (100 MG TOTAL) BY MOUTH EVERY 8 (EIGHT) HOURS. TO LOWER BLOOD PRESSURE Casey Rakes, MD Taking Active   insulin glargine (LANTUS) 100 UNIT/ML Solostar Pen [id number] Yes INJECT 10 UNITS INTO THE SKIN DAILY. Casey Rakes, MD Taking Active   Insulin Pen Needle (TRUEPLUS PEN NEEDLES) 32G X 4 MM MISC [id number] Yes Use to inject insulin. Casey Rakes, MD Taking Active   Misc. Devices [place] [id number] Yes Wheelchair with Accessories: elevating leg rests (ELRs), wheel locks, extensions and anti-tippers. Cane or walker will not suffice Casey Rakes, MD Taking Active   rosuvastatin (CRESTOR) 40 MG tablet [id number] Yes Take 1 tablet (40 mg total) by mouth daily. Casey Rider, NP Taking Active   TRUEplus Lancets 28G MISC [id number] Yes Check blood sugar before every meal, nightly up to 4 times a day. Casey Rakes, MD Taking Active             Patient Active Problem List   Diagnosis Date Noted   Chronic right shoulder pain 06/22/2020   Adhesive capsulitis of right shoulder 10/07/2019   Abnormality of gait 09/17/2019   Spastic hemiparesis of right dominant side ([place]) 07/02/2019   History of CVA with  residual deficit 06/09/2019   Spastic hemiparesis (HCC)    Uncontrolled type 2 diabetes mellitus with hyperglycemia (HCC)    Noncompliance    Benign essential HTN    Newly diagnosed diabetes ([place])    Leucocytosis    Right hemiparesis ([place])    Basal ganglia stroke ([place]) 03/04/2019   Stroke ([place]) 02/28/2019   Essential hypertension 02/28/2019   Tobacco abuse 02/28/2019   Hyperglycemia 02/28/2019    Conditions to be addressed/monitored per PCP order:  DMII and hx Stroke  Care Plan : Diabetes Type 2 (Adult)  Updates made by Casey Montane, RN since 03/24/2021 12:00 AM     Problem: Glycemic Management (Diabetes, Type 2)      Long-Range Goal: Glycemic Management Optimized   Start Date: 01/21/2021  Expected End Date: 04/26/2021  Recent Progress: On track  Priority: High  Note:   Objective:  Lab Results  Component  Value Date   HGBA1C 7.2 (H) 02/24/2021   Lab Results  Component Value Date   CREATININE 1.04 09/01/2020   CREATININE 0.94 07/24/2019   CREATININE 0.93 05/08/2019   No results found for: EGFR Current Barriers:  Knowledge Deficits related to basic Diabetes pathophysiology and self care/management-Mr. Canche is managing his health after having a stroke in 2020. He has right side hemiparesis, DMII and HTN. His DM is poorly controlled. Patient is unable to check his blood sugar and does not like to be stuck. He is interested in CGM. Patient administers his medications, but has limited understanding of his medications. He is interested in having his prescriptions delivered to his home. Patient reports his wheelchair is not in good shape and needs to be replaced or repaired. He lives with his fiance who helps him with managing his care.-Update-Patient with recent A1C 7.2 from 14.6. Reports checking BS twice daily with readings between 106-118. He is eating lean meats with fruits and vegetables. He did receive food resources from [place]. Patient received two resources to call  and check on new wheelchair. Planning to call today. Knowledge Deficits related to medications used for management of diabetes Difficulty obtaining or cannot afford medications Financial Constraints Cognitive Deficits Does not use cbg meter  Unable to independently monitor blood sugar Does not adhere to provider recommendations MP:[id number] blood sugar, maintaining a diabetic diet  Does not adhere to prescribed medication regimen Lacks social connections Does not contact provider office for questions/concerns Case Manager Clinical Goal(s):  patient will demonstrate improved adherence to prescribed treatment plan for diabetes self care/management as evidenced by: adherence to ADA/ carb modified diet, adherence to prescribed medication regimen, contacting provider for new or worsened symptoms or questions Interventions:  Inter-disciplinary care team collaboration (see longitudinal plan of care) Provided education to patient about diabetes Discussed plans with patient for ongoing care management follow up and provided patient with direct contact information for care management team Review of patient status, including review of consultants reports, relevant laboratory and other test results, and medications completed. Collaborated with [employer] regarding broken wheelchair. Per [employer] Mr. Norberto wheelchair is no longer a rental. He will have to take it to the retail office ([address] [place], [place] [address]) for assessment and repair. Details reviewed with Mr. Enberg and his fiance-Reviewed again. Mr. Fife has not taken wheelchair for repair Reviewed scheduled appointments  Reviewed medications and discussed Aspirin 85m is an OTC medication on his list, advised to begin taking  Discussed and provided heart healthy and diabetic diet Congratulated patient on diabetes management and improving A1C, encouraged patient to continue taking all medications, eating healthy and attending all  appointments Self-Care Activities - Self administers oral medications as prescribed Self administers insulin as prescribed Attends all scheduled provider appointments Adheres to prescribed ADA/carb modified Patient Goals: - work with [place] food resources - call PCP office and schedule a follow up appointment - eat three small meals a day, adhering to a diabetic diet - work with MM Pharmacist, NOvid Curdfor medication management - check blood sugar at prescribed times - enter blood sugar readings and medication or insulin into daily log - take the blood sugar meter to all doctor visits Follow Up Plan: Telephone follow up appointment with care management team member scheduled for:04/26/21 @ 9am      Follow Up:  Patient agrees to Care Plan and Follow-up.  Plan: The Managed Medicaid care management team will reach out to the patient again over  the next 30 days.  Date/time of next scheduled RN care management/care coordination outreach:  04/26/21 @ [place] RN, BSN [place]  [employer] manager

## 2021-03-24 NOTE — Telephone Encounter (Signed)
   Notes to clinic:   The original prescription was reordered on 02/02/2021 by Caro Hight, RN. Renewing this prescription may not be appropriate.  Requested Prescriptions  Pending Prescriptions Disp Refills   baclofen (LIORESAL) 20 MG tablet 90 tablet 3    Sig: TAKE 1.5 TABLETS (30 MG TOTAL) BY MOUTH 3 (THREE) TIMES DAILY.     There is no refill protocol information for this order

## 2021-03-24 NOTE — Patient Instructions (Signed)
Visit Information  Mr. Kings was given information about Medicaid Managed Care team care coordination services as a part of their [facility] Medicaid benefit. Jahron Hunsinger verbally consented to engagement with the [facility] Managed Care team.   If you are experiencing a medical emergency, please call 911 or report to your local emergency department or urgent care.   If you have a non-emergency medical problem during routine business hours, please contact your provider's office and ask to speak with a nurse.   For questions related to your [facility], please call: [phone] or visit the homepage here: [email or web address]  If you would like to schedule transportation through your [facility], please call the following number at least 2 days in advance of your appointment: [phone].   Call the Behavioral Health Crisis Line at [phone]) [phone], at any time, 24 hours a day, 7 days a week. If you are in danger or need immediate medical attention call 911.  If you would like help to quit smoking, call 1-800-QUIT-NOW ((445)345-6819) OR Espaol: 1-855-Djelo-Ya (2-534-062-7537) o para ms informacin haga clic aqu or Text READY to 031-971 to register via text  Mr. Payson - following are the goals we discussed in your visit today:   Goals Addressed             This Visit's Progress    Monitor and Manage My Blood Sugar-Diabetes Type 2       Timeframe:  Long-Range Goal Priority:  High Start Date:  01/21/21                           Expected End Date:   04/26/21                    Follow Up Date 04/26/21    - work with Care Guide for food resources - call PCP office and schedule a follow up appointment - eat three small meals a day, adhering to a diabetic diet - work with MM Pharmacist, Harrold Donath for medication management - check blood sugar at prescribed times -  enter blood sugar readings and medication or insulin into daily log - take the blood sugar meter to all doctor visits    Why is this important?   Checking your blood sugar at home helps to keep it from getting very high or very low.  Writing the results in a diary or log helps the doctor know how to care for you.  Your blood sugar log should have the time, date and the results.  Also, write down the amount of insulin or other medicine that you take.  Other information, like what you ate, exercise done and how you were feeling, will also be helpful.             Please see education materials related to diabetes, stroke and cholesterol provided as print materials.   The patient verbalized understanding of instructions provided today and agreed to receive a mailed copy of patient instruction and/or educational materials.  Telephone follow up appointment with Managed Medicaid care management team member scheduled for:04/26/21 @ 9am  Estanislado Emms RN, BSN [place]  [facility] RN Care Coordinator   Following is a copy of your plan of care:  Patient Care Plan: Diabetes Type 2 (Adult)     Problem Identified: Glycemic Management (Diabetes, Type 2)      Long-Range Goal: Glycemic Management Optimized  Start Date: 01/21/2021  Expected End Date: 04/26/2021  Recent Progress: On track  Priority: High  Note:   Objective:  Lab Results  Component Value Date   HGBA1C 7.2 (H) 02/24/2021   Lab Results  Component Value Date   CREATININE 1.04 09/01/2020   CREATININE 0.94 07/24/2019   CREATININE 0.93 05/08/2019   No results found for: EGFR Current Barriers:  Knowledge Deficits related to basic Diabetes pathophysiology and self care/management-Mr. Loschiavo is managing his health after having a stroke in 2020. He has right side hemiparesis, DMII and HTN. His DM is poorly controlled. Patient is unable to check his blood sugar and does not like to be stuck. He is interested in  CGM. Patient administers his medications, but has limited understanding of his medications. He is interested in having his prescriptions delivered to his home. Patient reports his wheelchair is not in good shape and needs to be replaced or repaired. He lives with his fiance who helps him with managing his care.-Update-Patient with recent A1C 7.2 from 14.6. Reports checking BS twice daily with readings between 106-118. He is eating lean meats with fruits and vegetables. He did receive food resources from [place]. Patient received two resources to call and check on new wheelchair. Planning to call today. Knowledge Deficits related to medications used for management of diabetes Difficulty obtaining or cannot afford medications Financial Constraints Cognitive Deficits Does not use cbg meter  Unable to independently monitor blood sugar Does not adhere to provider recommendations CH:EKBTCYELYH blood sugar, maintaining a diabetic diet  Does not adhere to prescribed medication regimen Lacks social connections Does not contact provider office for questions/concerns Case Manager Clinical Goal(s):  patient will demonstrate improved adherence to prescribed treatment plan for diabetes self care/management as evidenced by: adherence to ADA/ carb modified diet, adherence to prescribed medication regimen, contacting provider for new or worsened symptoms or questions Interventions:  Inter-disciplinary care team collaboration (see longitudinal plan of care) Provided education to patient about diabetes Discussed plans with patient for ongoing care management follow up and provided patient with direct contact information for care management team Review of patient status, including review of consultants reports, relevant laboratory and other test results, and medications completed. Collaborated with [employer] regarding broken wheelchair. Per [employer] Mr. Desaulniers wheelchair is no longer a rental. He will have to take it  to the retail office ([address], [place], [place] [address]) for assessment and repair. Details reviewed with Mr. Claggett and his fiance-Reviewed again. Mr. Tolson has not taken wheelchair for repair Reviewed scheduled appointments  Reviewed medications and discussed Aspirin $RemoveBeforeDEI'81mg'XfwdOwnmirkoSKbx$  is an OTC medication on his list, advised to begin taking  Discussed and provided heart healthy and diabetic diet Congratulated patient on diabetes management and improving A1C, encouraged patient to continue taking all medications, eating healthy and attending all appointments Self-Care Activities - Self administers oral medications as prescribed Self administers insulin as prescribed Attends all scheduled provider appointments Adheres to prescribed ADA/carb modified Patient Goals: - work with [place] for food resources - call PCP office and schedule a follow up appointment - eat three small meals a day, adhering to a diabetic diet - work with MM Pharmacist, Ovid Curd for medication management - check blood sugar at prescribed times - enter blood sugar readings and medication or insulin into daily log - take the blood sugar meter to all doctor visits Follow Up Plan: Telephone follow up appointment with care management team member scheduled for:04/26/21 @ 9am     Patient  Care Plan: Medication Management     Problem Identified: Health Promotion or Disease Self-Management (General Plan of Care)      Goal: Medication Management   Note:   Current Barriers:  Unable to independently monitor therapeutic efficacy Unable to achieve control of DM  Does not contact provider office for questions/concerns   Pharmacist Clinical Goal(s):  Over the next 30 days, patient will contact provider office for questions/concerns as evidenced notation of same in electronic health record through collaboration with PharmD and provider.    Interventions: Inter-disciplinary care team collaboration (see longitudinal plan of  care) Comprehensive medication review performed; medication list updated in electronic medical record    Patient Goals/Self-Care Activities Over the next 30 days, patient will:  - collaborate with provider on medication access solutions  Follow Up Plan: The care management team will reach out to the patient again over the next 30 days.

## 2021-03-25 DIAGNOSIS — I639 Cerebral infarction, unspecified: Secondary | ICD-10-CM | POA: Diagnosis not present

## 2021-03-26 ENCOUNTER — Telehealth: Payer: Self-pay | Admitting: Family Medicine

## 2021-03-26 DIAGNOSIS — I639 Cerebral infarction, unspecified: Secondary | ICD-10-CM | POA: Diagnosis not present

## 2021-03-26 NOTE — Telephone Encounter (Signed)
Copied from [place] [phone]. Topic: General - Inquiry >> Mar 26, 2021  9:57 AM Greggory Keen D wrote: Reason for CRM: Amber with [facility] nurse care manager,  she would like a nurse to call her back in regards to som paperwork that needs to be completed by provider.  CB#  [phone]

## 2021-03-27 DIAGNOSIS — I639 Cerebral infarction, unspecified: Secondary | ICD-10-CM | POA: Diagnosis not present

## 2021-03-29 DIAGNOSIS — I639 Cerebral infarction, unspecified: Secondary | ICD-10-CM | POA: Diagnosis not present

## 2021-03-30 DIAGNOSIS — I639 Cerebral infarction, unspecified: Secondary | ICD-10-CM | POA: Diagnosis not present

## 2021-03-30 NOTE — Telephone Encounter (Signed)
Paperwork will be filled out once received.

## 2021-03-31 ENCOUNTER — Other Ambulatory Visit: Payer: Self-pay

## 2021-03-31 DIAGNOSIS — I639 Cerebral infarction, unspecified: Secondary | ICD-10-CM | POA: Diagnosis not present

## 2021-04-01 DIAGNOSIS — I639 Cerebral infarction, unspecified: Secondary | ICD-10-CM | POA: Diagnosis not present

## 2021-04-02 DIAGNOSIS — I639 Cerebral infarction, unspecified: Secondary | ICD-10-CM | POA: Diagnosis not present

## 2021-04-03 DIAGNOSIS — I639 Cerebral infarction, unspecified: Secondary | ICD-10-CM | POA: Diagnosis not present

## 2021-04-05 DIAGNOSIS — I639 Cerebral infarction, unspecified: Secondary | ICD-10-CM | POA: Diagnosis not present

## 2021-04-06 DIAGNOSIS — I639 Cerebral infarction, unspecified: Secondary | ICD-10-CM | POA: Diagnosis not present

## 2021-04-07 DIAGNOSIS — I639 Cerebral infarction, unspecified: Secondary | ICD-10-CM | POA: Diagnosis not present

## 2021-04-08 DIAGNOSIS — I639 Cerebral infarction, unspecified: Secondary | ICD-10-CM | POA: Diagnosis not present

## 2021-04-09 DIAGNOSIS — I639 Cerebral infarction, unspecified: Secondary | ICD-10-CM | POA: Diagnosis not present

## 2021-04-10 DIAGNOSIS — I639 Cerebral infarction, unspecified: Secondary | ICD-10-CM | POA: Diagnosis not present

## 2021-04-12 DIAGNOSIS — I639 Cerebral infarction, unspecified: Secondary | ICD-10-CM | POA: Diagnosis not present

## 2021-04-13 DIAGNOSIS — I639 Cerebral infarction, unspecified: Secondary | ICD-10-CM | POA: Diagnosis not present

## 2021-04-14 DIAGNOSIS — I639 Cerebral infarction, unspecified: Secondary | ICD-10-CM | POA: Diagnosis not present

## 2021-04-15 DIAGNOSIS — I639 Cerebral infarction, unspecified: Secondary | ICD-10-CM | POA: Diagnosis not present

## 2021-04-16 DIAGNOSIS — I639 Cerebral infarction, unspecified: Secondary | ICD-10-CM | POA: Diagnosis not present

## 2021-04-17 DIAGNOSIS — I639 Cerebral infarction, unspecified: Secondary | ICD-10-CM | POA: Diagnosis not present

## 2021-04-19 DIAGNOSIS — I639 Cerebral infarction, unspecified: Secondary | ICD-10-CM | POA: Diagnosis not present

## 2021-04-20 ENCOUNTER — Other Ambulatory Visit: Payer: Self-pay | Admitting: *Deleted

## 2021-04-20 DIAGNOSIS — I639 Cerebral infarction, unspecified: Secondary | ICD-10-CM | POA: Diagnosis not present

## 2021-04-20 MED ORDER — BACLOFEN 20 MG PO TABS: ORAL_TABLET | ORAL | 3 refills | Status: DC

## 2021-04-21 ENCOUNTER — Telehealth: Payer: Self-pay | Admitting: *Deleted

## 2021-04-21 DIAGNOSIS — I639 Cerebral infarction, unspecified: Secondary | ICD-10-CM | POA: Diagnosis not present

## 2021-04-21 NOTE — Telephone Encounter (Signed)
'  Amber' with [facility] requesting a 3051 form for pt regarding Casey Petty. States had expired 02/05/21. States she had called 03/26/21 to request reauthorization. Would like ASAP please. She is faxing form to practice, when completed please FAX  to [phone].

## 2021-04-22 DIAGNOSIS — I639 Cerebral infarction, unspecified: Secondary | ICD-10-CM | POA: Diagnosis not present

## 2021-04-22 NOTE — Telephone Encounter (Signed)
Noted PCS form will be filled out and faxed.

## 2021-04-23 DIAGNOSIS — I639 Cerebral infarction, unspecified: Secondary | ICD-10-CM | POA: Diagnosis not present

## 2021-04-24 DIAGNOSIS — I639 Cerebral infarction, unspecified: Secondary | ICD-10-CM | POA: Diagnosis not present

## 2021-04-26 ENCOUNTER — Other Ambulatory Visit: Payer: Self-pay | Admitting: *Deleted

## 2021-04-26 ENCOUNTER — Other Ambulatory Visit: Payer: Self-pay

## 2021-04-26 DIAGNOSIS — I639 Cerebral infarction, unspecified: Secondary | ICD-10-CM | POA: Diagnosis not present

## 2021-04-26 NOTE — Patient Instructions (Signed)
Visit Information  Mr. Glance was given information about Medicaid Managed Care team care coordination services as a part of their [place] benefit. Daxen Lanum verbally consented to engagement with the [facility] Managed Care team.   If you are experiencing a medical emergency, please call 911 or report to your local emergency department or urgent care.   If you have a non-emergency medical problem during routine business hours, please contact your provider's office and ask to speak with a nurse.   For questions related to your [facility], please call: [phone] or visit the homepage here: [email or web address]  If you would like to schedule transportation through your [facility], please call the following number at least 2 days in advance of your appointment: [phone].   Call the [place] at [phone], at any time, 24 hours a day, 7 days a week. If you are in danger or need immediate medical attention call 911.  If you would like help to quit smoking, call 1-800-QUIT-NOW 475-475-4291) OR Espaol: 1-855-Djelo-Ya (4-818-563-1497) o para ms informacin haga clic aqu or Text READY to 200-400 to register via text  Mr. Brinegar - following are the goals we discussed in your visit today:   Goals Addressed             This Visit's Progress    Monitor and Manage My Blood Sugar-Diabetes Type 2       Timeframe:  Long-Range Goal Priority:  High Start Date:  01/21/21                           Expected End Date:   05/31/21                    Follow Up Date 05/31/21    - work with [place] for food resources - call PCP office and schedule a follow up appointment-scheduled for 06/09/21 - eat three small meals a day, adhering to a diabetic diet - work with [facility] Pharmacist, for medication management - check blood sugar at  prescribed times - enter blood sugar readings and medication or insulin into daily log - take the blood sugar meter to all doctor visits    Why is this important?   Checking your blood sugar at home helps to keep it from getting very high or very low.  Writing the results in a diary or log helps the doctor know how to care for you.  Your blood sugar log should have the time, date and the results.  Also, write down the amount of insulin or other medicine that you take.  Other information, like what you ate, exercise done and how you were feeling, will also be helpful.             Please see education materials related to diabetes and foot care provided as print materials.   The patient verbalized understanding of instructions provided today and agreed to receive a mailed copy of patient instruction and/or educational materials.  Telephone follow up appointment with Managed Medicaid care management team member scheduled for:05/31/21 @ [place] RN, BSN [place] RN Care Coordinator   Following is a copy of your plan of care:  Patient Care Plan: Diabetes Type 2 (Adult)     Problem Identified: Glycemic Management (Diabetes, Type 2)      Long-Range Goal: Glycemic Management Optimized  Start Date: 01/21/2021  Expected End Date: 05/31/2021  Recent Progress: On track  Priority: High  Note:   Objective:  Lab Results  Component Value Date   HGBA1C 7.2 (H) 02/24/2021   Lab Results  Component Value Date   CREATININE 1.04 09/01/2020   CREATININE 0.94 07/24/2019   CREATININE 0.93 05/08/2019   No results found for: EGFR Current Barriers:  Knowledge Deficits related to basic Diabetes pathophysiology and self care/management-Mr. Mckone is managing his health after having a stroke in 2020. He has right side hemiparesis, DMII and HTN. His DM is poorly controlled. Patient is unable to check his blood sugar and does not like to be stuck. He is  interested in CGM. Patient administers his medications, but has limited understanding of his medications. He is interested in having his prescriptions delivered to his home. Patient reports his wheelchair is not in good shape and needs to be replaced or repaired. He lives with his fiance who helps him with managing his care. Patient with recent A1C 7.2 from 14.6. Reports checking BS twice daily with readings between 106-118. He is eating lean meats with fruits and vegetables. He did receive food resources from [place]. Patient received two resources to call and check on new wheelchair. Planning to call today.-Update-Mr. Criscuolo reports BS readings 98-105 and BP 120-130's/70's. He is using [name] and would like one of his own. He has all of his medications and is taking as directed. He is working on walking with a cane and not using his wheelchair all of the time.  Knowledge Deficits related to medications used for management of diabetes Difficulty obtaining or cannot afford medications Financial Constraints Cognitive Deficits Does not use cbg meter  Unable to independently monitor blood sugar Does not adhere to provider recommendations [id number] blood sugar, maintaining a diabetic diet  Does not adhere to prescribed medication regimen Lacks social connections Does not contact provider office for questions/concerns Case Manager Clinical Goal(s):  patient will demonstrate improved adherence to prescribed treatment plan for diabetes self care/management as evidenced by: adherence to ADA/ carb modified diet, adherence to prescribed medication regimen, contacting provider for new or worsened symptoms or questions Interventions:  Inter-disciplinary care team collaboration (see longitudinal plan of care) Provided education to patient about diabetes and diet Discussed plans with patient for ongoing care management follow up and provided patient with direct contact information for care  management team Review of patient status, including review of consultants reports, relevant laboratory and other test results, and medications completed. Reviewed scheduled appointments: 9/20, patient has transportation arranged via [facility] Encouraged patient to continue exercises provided during PT Reviewed medications   Discussed the importance of foot care for diabetic patients, education provided Congratulated patient on diabetes management and improving A1C, encouraged patient to continue taking all medications, eating healthy and attending all appointments Self-Care Activities - Self administers oral medications as prescribed Self administers insulin as prescribed Attends all scheduled provider appointments Adheres to prescribed ADA/carb modified Patient Goals: - work with [place] for food resources - call PCP office and schedule a follow up appointment-scheduled for 06/09/21 - eat three small meals a day, adhering to a diabetic diet - work with [facility] Pharmacist, for medication management - check blood sugar at prescribed times - enter blood sugar readings and medication or insulin into daily log - take the blood sugar meter to all doctor visits  Follow Up Plan: Telephone follow up appointment with care management team member scheduled for:05/31/21 @ 9am  Patient Care Plan: Medication Management     Problem Identified: Health Promotion or Disease Self-Management (General Plan of Care)      Goal: Medication Management   Note:   Current Barriers:  Unable to independently monitor therapeutic efficacy Unable to achieve control of DM  Does not contact provider office for questions/concerns   Pharmacist Clinical Goal(s):  Over the next 30 days, patient will contact provider office for questions/concerns as evidenced notation of same in electronic health record through collaboration with PharmD and provider.    Interventions: Inter-disciplinary care team collaboration (see  longitudinal plan of care) Comprehensive medication review performed; medication list updated in electronic medical record    Patient Goals/Self-Care Activities Over the next 30 days, patient will:  - collaborate with provider on medication access solutions  Follow Up Plan: The care management team will reach out to the patient again over the next 30 days.

## 2021-04-26 NOTE — Patient Outreach (Addendum)
Medicaid Managed Care   Nurse Care Manager Note  04/26/2021 Name:  Casey Petty MRN:  [id number] DOB:  06/15/1961  Casey Petty is an 60 y.o. year old male who is a primary patient of Charlott Rakes, MD.  The [facility] Managed Care Coordination team was consulted for assistance with:    DMII, hx stroke  Mr. Rock was given information about Medicaid Managed Care Coordination team services today. Casey Petty Patient agreed to services and verbal consent obtained.  Engaged with patient by telephone for follow up visit in response to provider referral for case management and/or care coordination services.   Assessments/Interventions:  Review of past medical history, allergies, medications, health status, including review of consultants reports, laboratory and other test data, was performed as part of comprehensive evaluation and provision of chronic care management services.  SDOH (Social Determinants of Health) assessments and interventions performed:   Care Plan  Allergies  Allergen Reactions   Lisinopril Swelling    Angioedema- lips/face    Medications Reviewed Today     Reviewed by [name], RN (Registered Nurse) on 04/26/21 at [phone]  Med List Status: <None>   Medication Order Taking? Sig Documenting Provider Last Dose Status Informant  acetaminophen (TYLENOL) 325 MG tablet [id number]  Take 1-2 tablets (325-650 mg total) by mouth every 4 (four) hours as needed for mild pain.  Patient not taking: Reported on 03/24/2021   [name], PA-C  Active            Med Note [name], [name]   Tue Nov 24, 2020 10:30 AM) Prn only  amLODipine (NORVASC) 10 MG tablet [id number] Yes TAKE 1 TABLET (10 MG TOTAL) BY MOUTH DAILY. TO LOWER BLOOD PRESSURE Charlott Rakes, MD Taking Active   aspirin 81 MG EC tablet [id number] Yes Take 1 tablet (81 mg total) by mouth daily. Charlott Rakes, MD Taking Active   baclofen (LIORESAL) 20 MG tablet [id number] Yes TAKE 1.5 TABLETS (30 MG  TOTAL) BY MOUTH 3 (THREE) TIMES DAILY. Kirsteins, Luanna Salk, MD Taking Active   Blood Glucose Monitoring Suppl (ACCU-CHEK GUIDE) w/Device KIT [id number] Yes 1 each by Does not apply route in the morning, at noon, in the evening, and at bedtime. Charlott Rakes, MD Taking Active   Blood Glucose Monitoring Suppl (TRUE METRIX METER) w/Device KIT [id number] No Check blood sugar fasting and before meals and again if pt feels bad (symptoms of hypo).  Patient not taking: Reported on 04/26/2021   [name], MD Not Taking Active   carvedilol (COREG) 12.5 MG tablet [id number] Yes TAKE 1 TABLET (12.5 MG TOTAL) BY MOUTH 2 (TWO) TIMES DAILY WITH A MEAL. FOR BLOOD PRESSURE Charlott Rakes, MD Taking Active   clotrimazole (LOTRIMIN) 1 % cream [id number] No Apply to affected area 2 times daily for the next 7 days.  Patient not taking: No sig reported   [name], PA-C Not Taking Active   Continuous Blood Gluc Receiver ([place]) [place] [id number] No Use to check blood sugar TID.  Patient not taking: No sig reported   Charlott Rakes, MD Not Taking Active   Continuous Blood Gluc Sensor (DEXCOM G6 SENSOR) MISC [id number] No Use to check blood sugar TID.  Patient not taking: No sig reported   Charlott Rakes, MD Not Taking Active   Continuous Blood Gluc Transmit (DEXCOM G6 TRANSMITTER) MISC [id number] No Use to check blood sugar TID.  Patient not taking: No sig reported   Charlott Rakes, MD Not Taking Active   dapagliflozin propanediol (  FARXIGA) 10 MG TABS tablet [id number] Yes TAKE 1 TABLET (10 MG TOTAL) BY MOUTH DAILY BEFORE BREAKFAST. Hoy Register, MD Taking Active   diclofenac Sodium (VOLTAREN) 1 % GEL [id number] Yes Apply 2 g topically 4 (four) times daily. Marcello Fennel, MD Taking Active   ezetimibe (ZETIA) 10 MG tablet [id number] Yes TAKE 1 TABLET (10 MG TOTAL) BY MOUTH DAILY. Hoy Register, MD Taking Active   ezetimibe (ZETIA) 10 MG tablet [id number] No TAKE 1 TABLET (10 MG TOTAL) BY MOUTH  DAILY.  Patient not taking: Reported on 04/26/2021   Hoy Register, MD Not Taking Active            Med Note (Analeigh Aries A   Thu Jan 21, 2021  9:27 AM) Needs to pick up prescription  famotidine (PEPCID) 20 MG tablet [id number] Yes Take 1 tablet (20 mg total) by mouth daily. Hoy Register, MD Taking Active   glucose blood (ACCU-CHEK GUIDE) test strip [id number] Yes Use as instructed up to 4 times daily Hoy Register, MD Taking Active   glucose blood (TRUE METRIX BLOOD GLUCOSE TEST) test strip [id number] No Use as instructed before every meal nightly up to 4 times daily  Patient not taking: No sig reported   Hoy Register, MD Not Taking Active   hydrALAZINE (APRESOLINE) 100 MG tablet [id number] Yes TAKE 1 TABLET (100 MG TOTAL) BY MOUTH EVERY 8 (EIGHT) HOURS. TO LOWER BLOOD PRESSURE Hoy Register, MD Taking Active   insulin glargine (LANTUS) 100 UNIT/ML Solostar Pen [id number] Yes INJECT 10 UNITS INTO THE SKIN DAILY. Hoy Register, MD Taking Active   Insulin Pen Needle (TRUEPLUS PEN NEEDLES) 32G X 4 MM MISC [id number] Yes Use to inject insulin. Hoy Register, MD Taking Active   Misc. Devices MISC [id number] Yes Wheelchair with Accessories: elevating leg rests (ELRs), wheel locks, extensions and anti-tippers. Cane or walker will not suffice Hoy Register, MD Taking Active   rosuvastatin (CRESTOR) 40 MG tablet [id number] Yes Take 1 tablet (40 mg total) by mouth daily. Ihor Austin, NP Taking Active   TRUEplus Lancets 28G MISC [id number] Yes Check blood sugar before every meal, nightly up to 4 times a day. 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   Abnormality of gait 09/17/2019   Spastic hemiparesis of right dominant side (HCC) 07/02/2019   History of CVA with residual deficit 06/09/2019   Spastic hemiparesis (HCC)    Uncontrolled  type 2 diabetes mellitus with hyperglycemia (HCC)    Noncompliance    Benign essential HTN    Newly diagnosed diabetes (HCC)    Leucocytosis    Right hemiparesis (HCC)    Basal ganglia stroke (HCC) 03/04/2019   Stroke (HCC) 02/28/2019   Essential hypertension 02/28/2019   Tobacco abuse 02/28/2019   Hyperglycemia 02/28/2019    Conditions to be addressed/monitored per PCP order:  DMII and hx stroke  Care Plan : Diabetes Type 2 (Adult)  Updates made by Heidi Dach, RN since 04/26/2021 12:00 AM     Problem: Glycemic Management (Diabetes, Type 2)      Long-Range Goal: Glycemic Management Optimized   Start Date: 01/21/2021  Expected End Date: 05/31/2021  Recent Progress: On track  Priority: High  Note:   Objective:  Lab Results  Component Value Date   HGBA1C 7.2 (H) 02/24/2021  Lab Results  Component Value Date   CREATININE 1.04 09/01/2020   CREATININE 0.94 07/24/2019   CREATININE 0.93 05/08/2019   No results found for: EGFR Current Barriers:  Knowledge Deficits related to basic Diabetes pathophysiology and self care/management-Mr. Steers is managing his health after having a stroke in 2020. He has right side hemiparesis, DMII and HTN. His DM is poorly controlled. Patient is unable to check his blood sugar and does not like to be stuck. He is interested in CGM. Patient administers his medications, but has limited understanding of his medications. He is interested in having his prescriptions delivered to his home. Patient reports his wheelchair is not in good shape and needs to be replaced or repaired. He lives with his fiance who helps him with managing his care. Patient with recent A1C 7.2 from 14.6. Reports checking BS twice daily with readings between 106-118. He is eating lean meats with fruits and vegetables. He did receive food resources from [place]. Patient received two resources to call and check on new wheelchair. Planning to call today.-Update-Mr. Erhardt reports  BS readings 98-105 and BP 120-130's/70's. He is using [name] and would like one of his own. He has all of his medications and is taking as directed. He is working on walking with a cane and not using his wheelchair all of the time. He is working with his PCP for [employer]. Knowledge Deficits related to medications used for management of diabetes Difficulty obtaining or cannot afford medications Financial Constraints Cognitive Deficits Does not use cbg meter  Unable to independently monitor blood sugar Does not adhere to provider recommendations [id number] blood sugar, maintaining a diabetic diet  Does not adhere to prescribed medication regimen Lacks social connections Does not contact provider office for questions/concerns Case Manager Clinical Goal(s):  patient will demonstrate improved adherence to prescribed treatment plan for diabetes self care/management as evidenced by: adherence to ADA/ carb modified diet, adherence to prescribed medication regimen, contacting provider for new or worsened symptoms or questions Interventions:  Inter-disciplinary care team collaboration (see longitudinal plan of care) Provided education to patient about diabetes and diet Discussed plans with patient for ongoing care management follow up and provided patient with direct contact information for care management team Review of patient status, including review of consultants reports, relevant laboratory and other test results, and medications completed. Reviewed scheduled appointments: 9/20, patient has transportation arranged via [facility], 9/21-PCP (telehealth) Encouraged patient to continue exercises provided during PT Reviewed medications   Discussed the importance of foot care for diabetic patients, education provided Congratulated patient on diabetes management and improving A1C, encouraged patient to continue taking all medications, eating healthy and attending all appointments Collaborate with  PCP for BP cuff Self-Care Activities - Self administers oral medications as prescribed Self administers insulin as prescribed Attends all scheduled provider appointments Adheres to prescribed ADA/carb modified Patient Goals: - work with [place] for food resources - call PCP office and schedule a follow up appointment-scheduled for 06/09/21 - eat three small meals a day, adhering to a diabetic diet - work with [facility] Pharmacist, for medication management - check blood sugar at prescribed times - enter blood sugar readings and medication or insulin into daily log - take the blood sugar meter to all doctor visits  Follow Up Plan: Telephone follow up appointment with care management team member scheduled for:05/31/21 @ 9am      Follow Up:  Patient agrees to Care Plan and Follow-up.  Plan: The Managed Medicaid care  management team will reach out to the patient again over the next 30 days.  Date/time of next scheduled RN care management/care coordination outreach:  05/31/21 @ [place] RN, BSN [place]  [employer] manager

## 2021-04-27 ENCOUNTER — Encounter: Payer: Self-pay | Admitting: Physical Medicine & Rehabilitation

## 2021-04-27 ENCOUNTER — Telehealth: Payer: Self-pay | Admitting: Family Medicine

## 2021-04-27 ENCOUNTER — Encounter: Payer: Medicaid Other | Attending: Registered Nurse | Admitting: Physical Medicine & Rehabilitation

## 2021-04-27 ENCOUNTER — Other Ambulatory Visit: Payer: Self-pay

## 2021-04-27 VITALS — BP 133/79 | HR 79 | Temp 98.6°F | Ht 67.0 in | Wt 157.2 lb

## 2021-04-27 DIAGNOSIS — G8111 Spastic hemiplegia affecting right dominant side: Secondary | ICD-10-CM | POA: Insufficient documentation

## 2021-04-27 DIAGNOSIS — I639 Cerebral infarction, unspecified: Secondary | ICD-10-CM | POA: Diagnosis not present

## 2021-04-27 MED ORDER — TIZANIDINE HCL 4 MG PO TABS: 4.0000 mg | ORAL_TABLET | Freq: Every day | ORAL | 1 refills | Status: DC

## 2021-04-27 NOTE — Telephone Encounter (Signed)
Copied from [place] [phone]. Topic: General - Other >> Apr 27, 2021  1:09 PM Bayard Beaver wrote: Reason for [id number] with [facility] nurse care manager, she would like a nurse to call her back in regards to som paperwork that needs to be completed by prAmber with [facility] nurse care manager, she would like a nurse to call her back in regards to some paperwork that needs to be completed by provider.  CB# [phone]

## 2021-04-27 NOTE — Patient Instructions (Signed)
Please take baclofen 3 times a day at meals  Take new medicine tizanidine once a day at bedtime

## 2021-04-27 NOTE — Progress Notes (Signed)
Subjective:    Patient ID: Casey Petty, male    DOB: January 11, 1961, 60 y.o.   MRN: [id number] 60 year old male with history of HTN--no medicine 3 years who was admitted on 03/01/2019 with 2-day history of right-sided weakness with RLE pain, slurred speech and difficulty walking.  Work-up revealed 13 mm acute ischemic infarct left internal capsule and possible punctate acute/early subacute ischemic infarct in contralateral right internal capsule.  2D echo showed mild concentric LVH with EF > 60%.  Dr. [name] felt the stroke was due to small vessel disease and recommended aspirin/Plavix for 3 weeks followed by aspirin alone.  His blood pressures continue to be labile and medications were in process of being titrated.  He did develop angioedema with addition of lisinopril therefore this was discontinued and he was started on IV Solu-Medrol with improvement in symptoms.  Patient with resultant anarthria, dysarthria with mild receptive deficits and left-sided weakness affecting mobility and ADLs  Admit date: 03/04/2019 Discharge date: 03/20/2019 HPI   Right    Pec Major:      100 units                                                                          Biceps:            200                                     Brachioradialis 100                                     FCR                 300                                     FCU                 200                                     FDS                 200                                     FDP                 100                           Right    Soleus 100 Pain Inventory Average Pain 0 Pain Right Now 0 My pain is  no pain but spasms on total right-side when lying down at night  In the last 24 hours, has pain interfered with the following? General activity 0 Relation with others  0 Enjoyment of life 0 What TIME of day is your pain at its worst? morning , daytime, evening, and night Sleep (in general) Good  Pain is worse with: inactivity  and lying down Pain improves with:  nothing  helps the right-side spasms. Injection helped the right shoulder & arm. Relief from Meds:  none taken  Family History  Problem Relation Age of Onset   Heart attack Father    Cancer Neg Hx    Social History   Socioeconomic History   Marital status: Single    Spouse name: Not on file   Number of children: Not on file   Years of education: Not on file   Highest education level: Not on file  Occupational History   Not on file  Tobacco Use   Smoking status: Every Day   Smokeless tobacco: Never   Tobacco comments:    Smoking 4 cigs/day  Vaping Use   Vaping Use: Never used  Substance and Sexual Activity   Alcohol use: Yes    Alcohol/week: 2.0 standard drinks    Types: 2 Standard drinks or equivalent per week   Drug use: No   Sexual activity: Never  Other Topics Concern   Not on file  Social History Narrative   Not on file   Social Determinants of Health   Financial Resource Strain: Not on file  Food Insecurity: Food Insecurity Present   Worried About [place] in the Last Year: Sometimes true   Ran Out of Food in the Last Year: Sometimes true  Transportation Needs: No Transportation Needs   Lack of Transportation (Medical): No   Lack of Transportation (Non-Medical): No  Physical Activity: Not on file  Stress: Not on file  Social Connections: Not on file   Past Surgical History:  Procedure Laterality Date   NO PAST SURGERIES     Past Surgical History:  Procedure Laterality Date   NO PAST SURGERIES     Past Medical History:  Diagnosis Date   Hypertension    BP 133/79   Pulse 79   Temp 98.6 F (37 C)   Ht 5\' 7"  (1.702 m)   Wt 157 lb 3.2 oz (71.3 kg)   SpO2 98%   BMI 24.62 kg/m   Opioid Risk Score:   Fall Risk Score:  `1  Depression screen PHQ 2/9  Depression screen [facility] 2/9 11/24/2020 11/02/2020 09/24/2020 09/01/2020 07/16/2020 06/22/2020 02/06/2020  Decreased Interest 0 0 0 1 0 0 0  Down, Depressed,  Hopeless 0 0 0 0 0 0 0  PHQ - 2 Score 0 0 0 1 0 0 0  Altered sleeping - - - 0 - - -  Tired, decreased energy - - - 0 - - -  Change in appetite - - - 0 - - -  Feeling bad or failure about yourself  - - - 0 - - -  Trouble concentrating - - - 0 - - -  Moving slowly or fidgety/restless - - - 0 - - -  Suicidal thoughts - - - 0 - - -  PHQ-9 Score - - - 1 - - -     Review of Systems  Musculoskeletal:  Positive for gait problem.       Right side spasms  All other systems reviewed and are negative.     Objective:   Physical Exam Motor strength is 3 - in the right deltoid, bicep, tricep, grip, hip flexor, knee extensor, 0 at the ankle  dorsiflexor plantar flexor Tone MAS 3 at the elbow flexor MAS 2/3 in the pectoralis MAS 2 at the wrist and finger flexors With standing patient has increased total at the ankle plantar flexors of toe flexors right lower extremity no evidence of clonus at the ankle  Ambulates with a right AFO no evidence of toe drag or knee instability With brace off he does exhibit increased toe flexor tone on the right side. No pain with range of motion right upper extremity     Assessment & Plan:   Spasticity Right hemiparesis, night time lower ext spasm add tizanidine qhs Repeat dysport but increase to 1500U  Right    Pec Major:      200 units                                                                          Biceps:            200                                     Brachioradialis 100                                     FCR                 300                                     FCU                 200                                     FDS                 200                                     FDP                 100                           Right    Soleus 100 FDL 100U  3.  History of chronic post CVA shoulder pain resolved after intra-articular injection glenohumeral joint under ultrasound guidance performed in March 2022

## 2021-04-28 ENCOUNTER — Encounter: Payer: Self-pay | Admitting: Family Medicine

## 2021-04-28 ENCOUNTER — Ambulatory Visit: Payer: Medicaid Other | Attending: Family Medicine | Admitting: Family Medicine

## 2021-04-28 DIAGNOSIS — E1169 Type 2 diabetes mellitus with other specified complication: Secondary | ICD-10-CM | POA: Diagnosis not present

## 2021-04-28 DIAGNOSIS — Z794 Long term (current) use of insulin: Secondary | ICD-10-CM | POA: Diagnosis not present

## 2021-04-28 DIAGNOSIS — Z720 Tobacco use: Secondary | ICD-10-CM | POA: Diagnosis not present

## 2021-04-28 DIAGNOSIS — I639 Cerebral infarction, unspecified: Secondary | ICD-10-CM | POA: Diagnosis not present

## 2021-04-28 DIAGNOSIS — G8110 Spastic hemiplegia affecting unspecified side: Secondary | ICD-10-CM

## 2021-04-28 MED ORDER — BUPROPION HCL ER (XL) 150 MG PO TB24: 150.0000 mg | ORAL_TABLET | Freq: Every day | ORAL | 3 refills | Status: DC

## 2021-04-28 MED ORDER — INSULIN GLARGINE 100 UNIT/ML SOLOSTAR PEN: 10.0000 [IU] | PEN_INJECTOR | Freq: Every day | SUBCUTANEOUS | 6 refills | Status: DC

## 2021-04-28 NOTE — Progress Notes (Signed)
Virtual Visit via Telephone Note  I connected with Casey Petty, on 04/28/2021 at 11:53 AM by telephone due to the COVID-19 pandemic and verified that I am speaking with the correct person using two identifiers.   Consent: I discussed the limitations, risks, security and privacy concerns of performing an evaluation and management service by telephone and the availability of in person appointments. I also discussed with the patient that there may be a patient responsible charge related to this service. The patient expressed understanding and agreed to proceed.   Location of Patient: Home  Location of Provider: Clinic   Persons participating in Telemedicine visit: [name] Dr. Margarita Rana     History of Present Illness: Casey Petty is a 60 y.o. year old male with a history of type 2 diabetes mellitus (A1c 7.2) stroke (with right spastic hemiparesis s/p Botox injections by rehab), right adhesive capsulitis, dysarthria seen for telehealth visit due to need for [facility] services.   He receives 4 hrs Monday through Saturday. He needs assistance with his ADLs. At the moment he no longer needs a cane to ambulate inside the house but uses a cane or wheelchair outside the house. Closely followed by rehab medicine and receives Botox injections.  A1c is 7.2 down from 14.6 and he endorses compliance with his medications.  Requested refill of Lantus. Cut down to 3 cig/day and is interested in quitting. Past Medical History:  Diagnosis Date   Hypertension    Allergies  Allergen Reactions   Lisinopril Swelling    Angioedema- lips/face    Current Outpatient Medications on File Prior to Visit  Medication Sig Dispense Refill   acetaminophen (TYLENOL) 325 MG tablet Take 1-2 tablets (325-650 mg total) by mouth every 4 (four) hours as needed for mild pain.     amLODipine (NORVASC) 10 MG tablet TAKE 1 TABLET (10 MG TOTAL) BY MOUTH DAILY. TO LOWER BLOOD PRESSURE 90 tablet 1   aspirin 81  MG EC tablet Take 1 tablet (81 mg total) by mouth daily. 100 tablet 0   atorvastatin (LIPITOR) 80 MG tablet SMARTSIG:1 Tablet(s) By Mouth Every Evening     baclofen (LIORESAL) 20 MG tablet TAKE 1.5 TABLETS (30 MG TOTAL) BY MOUTH 3 (THREE) TIMES DAILY. 90 tablet 3   Blood Glucose Monitoring Suppl (ACCU-CHEK GUIDE) w/Device KIT 1 each by Does not apply route in the morning, at noon, in the evening, and at bedtime. 1 kit 0   Blood Glucose Monitoring Suppl (TRUE METRIX METER) w/Device KIT Check blood sugar fasting and before meals and again if pt feels bad (symptoms of hypo). 1 kit 0   carvedilol (COREG) 12.5 MG tablet TAKE 1 TABLET (12.5 MG TOTAL) BY MOUTH 2 (TWO) TIMES DAILY WITH A MEAL. FOR BLOOD PRESSURE 180 tablet 1   clotrimazole (LOTRIMIN) 1 % cream Apply to affected area 2 times daily for the next 7 days. 15 g 0   Continuous Blood Gluc Receiver (DEXCOM G6 RECEIVER) DEVI Use to check blood sugar TID. 1 each 0   Continuous Blood Gluc Sensor (DEXCOM G6 SENSOR) MISC Use to check blood sugar TID. 3 each 3   Continuous Blood Gluc Transmit (DEXCOM G6 TRANSMITTER) MISC Use to check blood sugar TID. 1 each 3   dapagliflozin propanediol (FARXIGA) 10 MG TABS tablet TAKE 1 TABLET (10 MG TOTAL) BY MOUTH DAILY BEFORE BREAKFAST. 90 tablet 1   diclofenac Sodium (VOLTAREN) 1 % GEL Apply 2 g topically 4 (four) times daily. 350 g 1   ezetimibe (  ZETIA) 10 MG tablet TAKE 1 TABLET (10 MG TOTAL) BY MOUTH DAILY. 90 tablet 3   ezetimibe (ZETIA) 10 MG tablet TAKE 1 TABLET (10 MG TOTAL) BY MOUTH DAILY. 90 tablet 1   famotidine (PEPCID) 20 MG tablet Take 1 tablet (20 mg total) by mouth daily. 30 tablet 0   glucose blood (ACCU-CHEK GUIDE) test strip Use as instructed up to 4 times daily 100 each 12   glucose blood (TRUE METRIX BLOOD GLUCOSE TEST) test strip Use as instructed before every meal nightly up to 4 times daily 100 each 12   hydrALAZINE (APRESOLINE) 100 MG tablet TAKE 1 TABLET (100 MG TOTAL) BY MOUTH EVERY 8  (EIGHT) HOURS. TO LOWER BLOOD PRESSURE 270 tablet 1   insulin glargine (LANTUS) 100 UNIT/ML Solostar Pen INJECT 10 UNITS INTO THE SKIN DAILY. 30 mL 6   Insulin Pen Needle (TRUEPLUS PEN NEEDLES) 32G X 4 MM MISC Use to inject insulin. 100 each 2   Misc. Devices MISC Wheelchair with Accessories: elevating leg rests (ELRs), wheel locks, extensions and anti-tippers. Cane or walker will not suffice 1 each 0   rosuvastatin (CRESTOR) 40 MG tablet Take 1 tablet (40 mg total) by mouth daily. 30 tablet 4   tiZANidine (ZANAFLEX) 4 MG tablet Take 1 tablet (4 mg total) by mouth at bedtime. 30 tablet 1   TRUEplus Lancets 28G MISC Check blood sugar before every meal, nightly up to 4 times a day. 100 each 12   No current facility-administered medications on file prior to visit.    ROS: See HPI  Observations/Objective: Awake, alert, oriented x3 Not in acute distress Normal mood  CMP Latest Ref Rng & Units 09/01/2020 07/24/2019 05/08/2019  Glucose 65 - 99 mg/dL 389(H) 152(H) 134(H)  BUN 6 - 24 mg/dL $Remove'11 8 7  'wewaGNf$ Creatinine 0.76 - 1.27 mg/dL 1.04 0.94 0.93  Sodium 134 - 144 mmol/L 139 141 138  Potassium 3.5 - 5.2 mmol/L 4.4 3.9 4.2  Chloride 96 - 106 mmol/L 101 103 100  CO2 20 - 29 mmol/L $RemoveB'23 23 23  'MImAaFET$ Calcium 8.7 - 10.2 mg/dL 10.3(H) 10.3(H) 10.1  Total Protein 6.0 - 8.5 g/dL 7.2 7.1 7.2  Total Bilirubin 0.0 - 1.2 mg/dL 0.4 0.6 0.6  Alkaline Phos 44 - 121 IU/L 101 105 99  AST 0 - 40 IU/L $Remov'24 19 19  'qruPvr$ ALT 0 - 44 IU/L 42 29 20    Lipid Panel     Component Value Date/Time   CHOL 145 02/24/2021 0826   TRIG 88 02/24/2021 0826   HDL 38 (L) 02/24/2021 0826   CHOLHDL 3.8 02/24/2021 0826   CHOLHDL 3.5 07/18/2014 0922   VLDL NOT CALC 07/18/2014 0922   LDLCALC 90 02/24/2021 0826   LABVLDL 17 02/24/2021 0826    Lab Results  Component Value Date   HGBA1C 7.2 (H) 02/24/2021    Assessment and Plan: 1. Spastic hemiparesis ([place]) Improving Continues to receive Botox injections from rehab Will complete form for  PCS services  2. Tobacco abuse Spent 3 minutes counseling on smoking cessation and he is willing to work on quitting Will initiate bupropion - buPROPion (WELLBUTRIN XL) 150 MG 24 hr tablet; Take 1 tablet (150 mg total) by mouth daily. For smoking cessation.  Dispense: 30 tablet; Refill: 3  3. Type 2 diabetes mellitus with other specified complication, with long-term current use of insulin (HCC) Controlled Significant improvement with A1c of 7.2 down from 14.6 Continue current regimen Counseled on Diabetic diet, my plate method, 417  minutes of moderate intensity exercise/week Blood sugar logs with fasting goals of 80-120 mg/dl, random of less than 180 and in the event of sugars less than 60 mg/dl or greater than 400 mg/dl encouraged to notify the clinic. Advised on the need for annual eye exams, annual foot exams, Pneumonia vaccine. - insulin glargine (LANTUS) 100 UNIT/ML Solostar Pen; INJECT 10 UNITS INTO THE SKIN DAILY.  Dispense: 30 mL; Refill: 6   Follow Up Instructions: Keep upcoming appointment 06/2021   I discussed the assessment and treatment plan with the patient. The patient was provided an opportunity to ask questions and all were answered. The patient agreed with the plan and demonstrated an understanding of the instructions.   The patient was advised to call back or seek an in-person evaluation if the symptoms worsen or if the condition fails to improve as anticipated.     I provided 9 minutes total of non-face-to-face time during this encounter.   Charlott Rakes, MD, FAAFP. [facility] and [place], [place]   04/28/2021, 11:53 AM

## 2021-04-29 DIAGNOSIS — I639 Cerebral infarction, unspecified: Secondary | ICD-10-CM | POA: Diagnosis not present

## 2021-04-29 NOTE — Telephone Encounter (Signed)
Returned [employer] and per vm she is currently out of office until further notice. Received PCS form from Dr. Margarita Rana and has been faxed

## 2021-04-30 DIAGNOSIS — I639 Cerebral infarction, unspecified: Secondary | ICD-10-CM | POA: Diagnosis not present

## 2021-05-01 DIAGNOSIS — I639 Cerebral infarction, unspecified: Secondary | ICD-10-CM | POA: Diagnosis not present

## 2021-05-03 ENCOUNTER — Telehealth: Payer: Self-pay | Admitting: Adult Health

## 2021-05-03 ENCOUNTER — Telehealth: Payer: Self-pay | Admitting: Family Medicine

## 2021-05-03 DIAGNOSIS — I639 Cerebral infarction, unspecified: Secondary | ICD-10-CM | POA: Diagnosis not present

## 2021-05-03 NOTE — Telephone Encounter (Signed)
[facility] called and spoke Hailey, Industrial/product designer. Advised the patient is not supposed to be on atorvastatin, switched to Rosuvastatin by neurology provider. She says the patient is wanting his Rosuvastatin refilled. I advised to call the neurology provider for refill, name provided, office/number provided, she verbalized understanding.

## 2021-05-03 NOTE — Telephone Encounter (Signed)
Hailey from [facility] called stating patient had called in to request rosuvastatin (CRESTOR) 40 MG tablet. They had previously filled atorvastatin (LIPITOR) 80 MG tablet.She was unsure of which medication he was suppose to be taking. Would like a call back as soon as possible

## 2021-05-03 NOTE — Telephone Encounter (Signed)
[facility] ;([place]) request refill for rosuvastatin (CRESTOR) 40 MG tablet at [place]

## 2021-05-03 NOTE — Telephone Encounter (Signed)
PCP refills cholesterol meds per NP's last note. I have update [facility] on this via fax.

## 2021-05-04 DIAGNOSIS — I639 Cerebral infarction, unspecified: Secondary | ICD-10-CM | POA: Diagnosis not present

## 2021-05-04 NOTE — Telephone Encounter (Signed)
Will forward to nurse 

## 2021-05-04 NOTE — Telephone Encounter (Signed)
Amber with [facility] is calling she has not received the PCS form nor office notes please refax  [phone]

## 2021-05-05 DIAGNOSIS — I639 Cerebral infarction, unspecified: Secondary | ICD-10-CM | POA: Diagnosis not present

## 2021-05-05 NOTE — Telephone Encounter (Signed)
PCS form has been re faxed.

## 2021-05-06 DIAGNOSIS — I639 Cerebral infarction, unspecified: Secondary | ICD-10-CM | POA: Diagnosis not present

## 2021-05-07 DIAGNOSIS — I639 Cerebral infarction, unspecified: Secondary | ICD-10-CM | POA: Diagnosis not present

## 2021-05-08 DIAGNOSIS — I639 Cerebral infarction, unspecified: Secondary | ICD-10-CM | POA: Diagnosis not present

## 2021-05-10 DIAGNOSIS — I639 Cerebral infarction, unspecified: Secondary | ICD-10-CM | POA: Diagnosis not present

## 2021-05-11 DIAGNOSIS — I639 Cerebral infarction, unspecified: Secondary | ICD-10-CM | POA: Diagnosis not present

## 2021-05-12 DIAGNOSIS — I639 Cerebral infarction, unspecified: Secondary | ICD-10-CM | POA: Diagnosis not present

## 2021-05-13 DIAGNOSIS — I639 Cerebral infarction, unspecified: Secondary | ICD-10-CM | POA: Diagnosis not present

## 2021-05-14 DIAGNOSIS — I639 Cerebral infarction, unspecified: Secondary | ICD-10-CM | POA: Diagnosis not present

## 2021-05-15 DIAGNOSIS — I639 Cerebral infarction, unspecified: Secondary | ICD-10-CM | POA: Diagnosis not present

## 2021-05-18 DIAGNOSIS — I639 Cerebral infarction, unspecified: Secondary | ICD-10-CM | POA: Diagnosis not present

## 2021-05-19 DIAGNOSIS — I639 Cerebral infarction, unspecified: Secondary | ICD-10-CM | POA: Diagnosis not present

## 2021-05-20 DIAGNOSIS — I639 Cerebral infarction, unspecified: Secondary | ICD-10-CM | POA: Diagnosis not present

## 2021-05-21 DIAGNOSIS — I639 Cerebral infarction, unspecified: Secondary | ICD-10-CM | POA: Diagnosis not present

## 2021-05-22 DIAGNOSIS — I639 Cerebral infarction, unspecified: Secondary | ICD-10-CM | POA: Diagnosis not present

## 2021-05-24 DIAGNOSIS — I639 Cerebral infarction, unspecified: Secondary | ICD-10-CM | POA: Diagnosis not present

## 2021-05-25 DIAGNOSIS — I639 Cerebral infarction, unspecified: Secondary | ICD-10-CM | POA: Diagnosis not present

## 2021-05-26 DIAGNOSIS — I639 Cerebral infarction, unspecified: Secondary | ICD-10-CM | POA: Diagnosis not present

## 2021-05-27 DIAGNOSIS — I639 Cerebral infarction, unspecified: Secondary | ICD-10-CM | POA: Diagnosis not present

## 2021-05-28 DIAGNOSIS — I639 Cerebral infarction, unspecified: Secondary | ICD-10-CM | POA: Diagnosis not present

## 2021-05-29 DIAGNOSIS — I639 Cerebral infarction, unspecified: Secondary | ICD-10-CM | POA: Diagnosis not present

## 2021-05-31 ENCOUNTER — Other Ambulatory Visit: Payer: Self-pay | Admitting: *Deleted

## 2021-05-31 ENCOUNTER — Other Ambulatory Visit: Payer: Self-pay

## 2021-05-31 ENCOUNTER — Other Ambulatory Visit: Payer: Self-pay | Admitting: Family Medicine

## 2021-05-31 DIAGNOSIS — I639 Cerebral infarction, unspecified: Secondary | ICD-10-CM | POA: Diagnosis not present

## 2021-05-31 MED ORDER — MISC. DEVICES MISC: 0 refills | Status: DC

## 2021-05-31 NOTE — Patient Outreach (Signed)
Medicaid Managed Care   Nurse Care Manager Note  05/31/2021 Name:  Casey Petty MRN:  [id number] DOB:  [birth year]  Casey Petty is an 60 y.o. year old male who is a primary patient of Casey Rakes, MD.  The [facility] Managed Care Coordination team was consulted for assistance with:    DMII, hx CVA  Casey Petty was given information about Medicaid Managed Care Coordination team services today. Casey Petty Patient agreed to services and verbal consent obtained.  Engaged with patient by telephone for follow up visit in response to provider referral for case management and/or care coordination services.   Assessments/Interventions:  Review of past medical history, allergies, medications, health status, including review of consultants reports, laboratory and other test data, was performed as part of comprehensive evaluation and provision of chronic care management services.  SDOH (Social Determinants of Health) assessments and interventions performed: SDOH Interventions    Flowsheet Row Most Recent Value  SDOH Interventions   Social Connections Interventions Patient Refused       Care Plan  Allergies  Allergen Reactions   Lisinopril Swelling    Angioedema- lips/face    Medications Reviewed Today     Reviewed by [name], RN (Registered Nurse) on 05/31/21 at [place] List Status: <None>   Medication Order Taking? Sig Documenting Provider Last Dose Status Informant  acetaminophen (TYLENOL) 325 MG tablet [id number] Yes Take 1-2 tablets (325-650 mg total) by mouth every 4 (four) hours as needed for mild pain. [name], PA-C Taking Active            Med Note [name], [name]   Tue Nov 24, 2020 10:30 AM) Prn only  amLODipine (NORVASC) 10 MG tablet [id number] Yes TAKE 1 TABLET (10 MG TOTAL) BY MOUTH DAILY. TO LOWER BLOOD PRESSURE Casey Rakes, MD Taking Active   aspirin 81 MG EC tablet [id number] Yes Take 1 tablet (81 mg total) by mouth daily. Casey Rakes,  MD Taking Active   atorvastatin (LIPITOR) 80 MG tablet [id number] No SMARTSIG:1 Tablet(s) By Mouth Every Evening  Patient not taking: Reported on 05/31/2021   [provider] Not Taking Active   baclofen (LIORESAL) 20 MG tablet [id number] Yes TAKE 1.5 TABLETS (30 MG TOTAL) BY MOUTH 3 (THREE) TIMES DAILY. Kirsteins, Luanna Salk, MD Taking Active   Blood Glucose Monitoring Suppl (ACCU-CHEK GUIDE) w/Device KIT [id number] Yes 1 each by Does not apply route in the morning, at noon, in the evening, and at bedtime. Casey Rakes, MD Taking Active   Blood Glucose Monitoring Suppl (TRUE METRIX METER) w/Device KIT [id number] No Check blood sugar fasting and before meals and again if pt feels bad (symptoms of hypo).  Patient not taking: Reported on 05/31/2021   [name], MD Not Taking Active   buPROPion (WELLBUTRIN XL) 150 MG 24 hr tablet [id number] Yes Take 1 tablet (150 mg total) by mouth daily. For smoking cessation. Casey Rakes, MD Taking Active   carvedilol (COREG) 12.5 MG tablet [id number] Yes TAKE 1 TABLET (12.5 MG TOTAL) BY MOUTH 2 (TWO) TIMES DAILY WITH A MEAL. FOR BLOOD PRESSURE Casey Rakes, MD Taking Active   clotrimazole (LOTRIMIN) 1 % cream [id number] No Apply to affected area 2 times daily for the next 7 days.  Patient not taking: Reported on 05/31/2021   Casey Fitting, PA-C Not Taking Active   Continuous Blood Gluc Receiver ([place]) DEVI [id number] No Use to check blood sugar TID.  Patient not taking: Reported on 05/31/2021   Casey Rakes,  MD Not Taking Active   Continuous Blood Gluc Sensor (DEXCOM G6 SENSOR) MISC [id number] No Use to check blood sugar TID.  Patient not taking: Reported on 05/31/2021   Casey Rakes, MD Not Taking Active   Continuous Blood Gluc Transmit (DEXCOM G6 TRANSMITTER) MISC [id number] No Use to check blood sugar TID.  Patient not taking: Reported on 05/31/2021   Casey Rakes, MD Not Taking Active   dapagliflozin propanediol (FARXIGA) 10  MG TABS tablet [id number] Yes TAKE 1 TABLET (10 MG TOTAL) BY MOUTH DAILY BEFORE BREAKFAST. Casey Rakes, MD Taking Active   diclofenac Sodium (VOLTAREN) 1 % GEL [id number] No Apply 2 g topically 4 (four) times daily.  Patient not taking: Reported on 05/31/2021   [name], MD Not Taking Active   ezetimibe (ZETIA) 10 MG tablet [id number] Yes TAKE 1 TABLET (10 MG TOTAL) BY MOUTH DAILY. Casey Rakes, MD Taking Active   ezetimibe (ZETIA) 10 MG tablet [id number]  TAKE 1 TABLET (10 MG TOTAL) BY MOUTH DAILY. Casey Rakes, MD  Consider Medication Status and Discontinue (Patient Preference)            Med Note (ROBB, MELANIE A   Thu Jan 21, 2021  9:27 AM) Needs to pick up prescription  famotidine (PEPCID) 20 MG tablet [id number] Yes Take 1 tablet (20 mg total) by mouth daily. Casey Rakes, MD Taking Active   glucose blood (ACCU-CHEK GUIDE) test strip [id number] Yes Use as instructed up to 4 times daily Casey Rakes, MD Taking Active   glucose blood (TRUE METRIX BLOOD GLUCOSE TEST) test strip [id number] No Use as instructed before every meal nightly up to 4 times daily  Patient not taking: Reported on 05/31/2021   Casey Rakes, MD Not Taking Active   hydrALAZINE (APRESOLINE) 100 MG tablet [id number] Yes TAKE 1 TABLET (100 MG TOTAL) BY MOUTH EVERY 8 (EIGHT) HOURS. TO LOWER BLOOD PRESSURE Casey Rakes, MD Taking Active   insulin glargine (LANTUS) 100 UNIT/ML Solostar Pen [id number] Yes INJECT 10 UNITS INTO THE SKIN DAILY. Casey Rakes, MD Taking Active   Insulin Pen Needle (TRUEPLUS PEN NEEDLES) 32G X 4 MM MISC [id number] Yes Use to inject insulin. Casey Rakes, MD Taking Active   Misc. Devices [place] [id number]  Wheelchair with Accessories: elevating leg rests (ELRs), wheel locks, extensions and anti-tippers. Cane or walker will not suffice Casey Rakes, MD  Active   rosuvastatin (CRESTOR) 40 MG tablet [id number] Yes Take 1 tablet (40 mg total) by mouth daily. Casey Rider, NP Taking  Active   tiZANidine (ZANAFLEX) 4 MG tablet [id number] No Take 1 tablet (4 mg total) by mouth at bedtime.  Patient not taking: Reported on 05/31/2021   Casey Blake, MD Not Taking Active            Med Note [name], MELANIE A   Mon May 31, 2021  9:18 AM) Patient has not received this prescription  TRUEplus Lancets 28G MISC [id number] Yes Check blood sugar before every meal, nightly up to 4 times a day. Casey Rakes, MD Taking Active             Patient Active Problem List   Diagnosis Date Noted   Chronic right shoulder pain 06/22/2020   Adhesive capsulitis of right shoulder 10/07/2019   Abnormality of gait 09/17/2019   Spastic hemiparesis of right dominant side ([place]) 07/02/2019   History of CVA with residual deficit 06/09/2019   Spastic hemiparesis ([place])    Uncontrolled type 2 diabetes mellitus with hyperglycemia ([place])  Noncompliance    Benign essential HTN    Newly diagnosed diabetes ([place])    Leucocytosis    Right hemiparesis ([facility])    Basal ganglia stroke ([place]) 03/04/2019   Stroke ([place]) 02/28/2019   Essential hypertension 02/28/2019   Tobacco abuse 02/28/2019   Hyperglycemia 02/28/2019    Conditions to be addressed/monitored per PCP order:  DMII and hx CVA  Care Plan : Diabetes Type 2 (Adult)  Updates made by [name], RN since 05/31/2021 12:00 AM     Problem: Glycemic Management (Diabetes, Type 2)      Long-Range Goal: Glycemic Management Optimized   Start Date: 01/21/2021  Expected End Date: 06/30/2021  Recent Progress: On track  Priority: High  Note:   Objective:  Lab Results  Component Value Date   HGBA1C 7.2 (H) 02/24/2021   Lab Results  Component Value Date   CREATININE 1.04 09/01/2020   CREATININE 0.94 07/24/2019   CREATININE 0.93 05/08/2019   No results found for: EGFR Current Barriers:  Knowledge Deficits related to basic Diabetes pathophysiology and self care/management-Mr. Kaas is managing his health after having a stroke in  2020. He has right side hemiparesis, DMII and HTN. His DM is poorly controlled. Patient is unable to check his blood sugar and does not like to be stuck. He is interested in CGM. Patient administers his medications, but has limited understanding of his medications. He is interested in having his prescriptions delivered to his home. Patient reports his wheelchair is not in good shape and needs to be replaced or repaired. He lives with his fiance who helps him with managing his care. Patient with recent A1C 7.2 from 14.6. Reports checking BS twice daily with readings between 106-118. He is eating lean meats with fruits and vegetables. He did receive food resources from [place]. Patient received two resources to call and check on new wheelchair. Planning to call today. Mr. Knoch is using [name] and would like one of his own. He is working on walking with a cane and not using his wheelchair all of the time. He is working with his PCP for [employer].-Update-Mr. Sawtelle has not received BP cuff, he continues to use his fiance's, reports last reading good, but unable to remember the reading. He is interested in smoking cessation, currently smokes 5 cig/day. He is getting married Nov 19.  Knowledge Deficits related to medications used for management of diabetes Difficulty obtaining or cannot afford medications Financial Constraints Cognitive Deficits Does not use cbg meter  Unable to independently monitor blood sugar Does not adhere to provider recommendations [id number] blood sugar, maintaining a diabetic diet  Does not adhere to prescribed medication regimen Lacks social connections Does not contact provider office for questions/concerns Case Manager Clinical Goal(s):  patient will demonstrate improved adherence to prescribed treatment plan for diabetes self care/management as evidenced by: adherence to ADA/ carb modified diet, adherence to prescribed medication regimen, contacting provider for  new or worsened symptoms or questions Interventions:  Inter-disciplinary care team collaboration (see longitudinal plan of care) Provided education to patient about smoking cessation Discussed plans with patient for ongoing care management follow up and provided patient with direct contact information for care management team Review of patient status, including review of consultants reports, relevant laboratory and other test results, and medications completed. Reviewed scheduled appointments: 11/2 with PCP Encouraged patient to continue exercises provided during PT Reviewed medications-discussed calling pharmacy for new prescription, tizanidine Discussed the importance of dental care, provided information for  [facility] [phone] Collaborate with PCP for BP cuff Congratulated Mr. Okray on his upcoming wedding Self-Care Activities - Self administers oral medications as prescribed Self administers insulin as prescribed Attends all scheduled provider appointments Adheres to prescribed ADA/carb modified Patient Goals: - call [facility] [phone] for dental evaluation - work with Care Guide for food resources - call PCP office and schedule a follow up appointment-scheduled for 06/09/21 - eat three small meals a day, adhering to a diabetic diet - work with [facility] Pharmacist, for medication management - call [facility] for newly prescribed medication-tizanidine, to take at bedtime - check blood sugar at prescribed times - enter blood sugar readings and medication or insulin into daily log - take the blood sugar meter to all doctor visits  Follow Up Plan: Telephone follow up appointment with care management team member scheduled for:06/30/21 @ 9am      Follow Up:  Patient agrees to Care Plan and Follow-up.  Plan: The Managed Medicaid care management team will reach out to the patient again over the next 30 days.  Date/time of next scheduled RN care management/care coordination  outreach:  06/30/21 @ [place] RN, BSN [place]  [employer] manager

## 2021-05-31 NOTE — Patient Instructions (Signed)
Visit Information  Casey Petty was given information about Medicaid Managed Care team care coordination services as a part of their [employer] Community Plan Medicaid benefit. Casey Petty verbally consented to engagement with the Medicaid Managed Care team.   If you are experiencing a medical emergency, please call 911 or report to your local emergency department or urgent care.   If you have a non-emergency medical problem during routine business hours, please contact your provider's office and ask to speak with a nurse.   For questions related to your [employer] Community Plan Medicaid, please call: [phone] or visit the homepage here: [email or web address]  If you would like to schedule transportation through your [employer] Community Plan Medicaid, please call the following number at least 2 days in advance of your appointment: [phone].   Call the Behavioral Health Crisis Line at 1-877-334-1141, at any time, 24 hours a day, 7 days a week. If you are in danger or need immediate medical attention call 911.  If you would like help to quit smoking, call 1-800-QUIT-NOW (1-800-784-8669) OR Espaol: 1-855-Djelo-Ya (1-855-335-3569) o para ms informacin haga clic aqu or Text READY to 200-400 to register via text  Casey Petty - following are the goals we discussed in your visit today:   Goals Addressed             This Visit's Progress    Monitor and Manage My Blood Sugar-Diabetes Type 2       Timeframe:  Long-Range Goal Priority:  High Start Date:  01/21/21                           Expected End Date:   06/30/21                    Follow Up Date 06/30/21    - call [facility] [phone] for dental evaluation - work with Care Guide for food resources - call PCP office and schedule a follow up appointment-scheduled for 06/09/21 - eat three small meals a day, adhering to a diabetic diet - work with MM  Pharmacist, for medication management - call [facility] for newly prescribed medication-tizanidine, to take at bedtime - check blood sugar at prescribed times - enter blood sugar readings and medication or insulin into daily log - take the blood sugar meter to all doctor visits    Why is this important?   Checking your blood sugar at home helps to keep it from getting very high or very low.  Writing the results in a diary or log helps the doctor know how to care for you.  Your blood sugar log should have the time, date and the results.  Also, write down the amount of insulin or other medicine that you take.  Other information, like what you ate, exercise done and how you were feeling, will also be helpful.             Please see education materials related to smoking cessation provided as print materials.   The patient verbalized understanding of instructions provided today and agreed to receive a mailed copy of patient instruction and/or educational materials.  Telephone follow up appointment with Managed Medicaid care management team member scheduled for:06/30/21 @ 9am  Melanie Robb RN, BSN [place]  [facility] RN Care Coordinator   Following is a copy of your plan of care:  Patient Care Plan: Diabetes Type 2 (Adult)       Problem Identified: Glycemic Management (Diabetes, Type 2)      Long-Range Goal: Glycemic Management Optimized   Start Date: 01/21/2021  Expected End Date: 06/30/2021  Recent Progress: On track  Priority: High  Note:   Objective:  Lab Results  Component Value Date   HGBA1C 7.2 (H) 02/24/2021   Lab Results  Component Value Date   CREATININE 1.04 09/01/2020   CREATININE 0.94 07/24/2019   CREATININE 0.93 05/08/2019   No results found for: EGFR Current Barriers:  Knowledge Deficits related to basic Diabetes pathophysiology and self care/management-Casey Petty is managing his health after having a stroke in 2020. He has right side  hemiparesis, DMII and HTN. His DM is poorly controlled. Patient is unable to check his blood sugar and does not like to be stuck. He is interested in CGM. Patient administers his medications, but has limited understanding of his medications. He is interested in having his prescriptions delivered to his home. Patient reports his wheelchair is not in good shape and needs to be replaced or repaired. He lives with his fiance who helps him with managing his care. Patient with recent A1C 7.2 from 14.6. Reports checking BS twice daily with readings between 106-118. He is eating lean meats with fruits and vegetables. He did receive food resources from [facility]. Patient received two resources to call and check on new wheelchair. Planning to call today. Casey Petty is using [name] and would like one of his own. He is working on walking with a cane and not using his wheelchair all of the time. He is working with his PCP for [facility].-Update-Casey Petty has not received BP cuff, he continues to use his fiance's, reports last reading good, but unable to remember the reading. He is interested in smoking cessation, currently smokes 5 cig/day. He is getting married Nov 19.  Knowledge Deficits related to medications used for management of diabetes Difficulty obtaining or cannot afford medications Financial Constraints Cognitive Deficits Does not use cbg meter  Unable to independently monitor blood sugar Does not adhere to provider recommendations re:monitoring blood sugar, maintaining a diabetic diet  Does not adhere to prescribed medication regimen Lacks social connections Does not contact provider office for questions/concerns Case Manager Clinical Goal(s):  patient will demonstrate improved adherence to prescribed treatment plan for diabetes self care/management as evidenced by: adherence to ADA/ carb modified diet, adherence to prescribed medication regimen, contacting provider for new or worsened symptoms  or questions Interventions:  Inter-disciplinary care team collaboration (see longitudinal plan of care) Provided education to patient about smoking cessation Discussed plans with patient for ongoing care management follow up and provided patient with direct contact information for care management team Review of patient status, including review of consultants reports, relevant laboratory and other test results, and medications completed. Reviewed scheduled appointments: 11/2 with PCP Encouraged patient to continue exercises provided during PT Reviewed medications-discussed calling pharmacy for new prescription, tizanidine Discussed the importance of dental care, provided information for [facility] [phone] Collaborate with PCP for BP cuff Congratulated Casey Petty on his upcoming wedding Self-Care Activities - Self administers oral medications as prescribed Self administers insulin as prescribed Attends all scheduled provider appointments Adheres to prescribed ADA/carb modified Patient Goals: - call [facility] [phone] for dental evaluation - work with Care Guide for food resources - call PCP office and schedule a follow up appointment-scheduled for 06/09/21 - eat three small meals a day, adhering to a diabetic diet - work with [facility] Pharmacist, for medication   management - call [facility] for newly prescribed medication-tizanidine, to take at bedtime - check blood sugar at prescribed times - enter blood sugar readings and medication or insulin into daily log - take the blood sugar meter to all doctor visits  Follow Up Plan: Telephone follow up appointment with care management team member scheduled for:06/30/21 @ 9am     Patient Care Plan: Medication Management     Problem Identified: Health Promotion or Disease Self-Management (General Plan of Care)      Goal: Medication Management   Note:   Current Barriers:  Unable to independently monitor therapeutic efficacy Unable  to achieve control of DM  Does not contact provider office for questions/concerns   Pharmacist Clinical Goal(s):  Over the next 30 days, patient will contact provider office for questions/concerns as evidenced notation of same in electronic health record through collaboration with PharmD and provider.    Interventions: Inter-disciplinary care team collaboration (see longitudinal plan of care) Comprehensive medication review performed; medication list updated in electronic medical record    Patient Goals/Self-Care Activities Over the next 30 days, patient will:  - collaborate with provider on medication access solutions  Follow Up Plan: The care management team will reach out to the patient again over the next 30 days.         

## 2021-06-01 DIAGNOSIS — I639 Cerebral infarction, unspecified: Secondary | ICD-10-CM | POA: Diagnosis not present

## 2021-06-02 DIAGNOSIS — I639 Cerebral infarction, unspecified: Secondary | ICD-10-CM | POA: Diagnosis not present

## 2021-06-03 DIAGNOSIS — I639 Cerebral infarction, unspecified: Secondary | ICD-10-CM | POA: Diagnosis not present

## 2021-06-04 DIAGNOSIS — I639 Cerebral infarction, unspecified: Secondary | ICD-10-CM | POA: Diagnosis not present

## 2021-06-05 DIAGNOSIS — I639 Cerebral infarction, unspecified: Secondary | ICD-10-CM | POA: Diagnosis not present

## 2021-06-07 DIAGNOSIS — I639 Cerebral infarction, unspecified: Secondary | ICD-10-CM | POA: Diagnosis not present

## 2021-06-08 ENCOUNTER — Encounter: Payer: Medicaid Other | Attending: Registered Nurse | Admitting: Physical Medicine & Rehabilitation

## 2021-06-08 ENCOUNTER — Encounter: Payer: Self-pay | Admitting: Physical Medicine & Rehabilitation

## 2021-06-08 ENCOUNTER — Other Ambulatory Visit: Payer: Self-pay

## 2021-06-08 VITALS — BP 157/80 | HR 61 | Temp 98.2°F | Ht 67.0 in | Wt 157.8 lb

## 2021-06-08 DIAGNOSIS — G8111 Spastic hemiplegia affecting right dominant side: Secondary | ICD-10-CM | POA: Diagnosis not present

## 2021-06-08 DIAGNOSIS — I639 Cerebral infarction, unspecified: Secondary | ICD-10-CM | POA: Diagnosis not present

## 2021-06-08 HISTORY — PX: WISDOM TOOTH EXTRACTION: SHX21

## 2021-06-08 NOTE — Progress Notes (Signed)
Botox Injection for spasticity using needle EMG guidance  Dilution: 50 units/ml Indication: Severe spasticity which interferes with ADL,mobility and/or  hygiene and is unresponsive to medication management and other conservative care Informed consent was obtained after describing risks and benefits of the procedure with the patient. This includes bleeding, bruising, infection, excessive weakness, or medication side effects. A REMS form is on file and signed. Needle: 2" 25g needle electrode Number of units per muscle  RIGHT  Pec Major:      200 units                                                                          Biceps:            300                                     Brachioradialis 100                                     FCR                 200                                     FCU                 200                                     FDS                 200                                     FDP                 100                            Right    Soleus 100 FDL 100U All injections were done after obtaining appropriate EMG activity and after negative drawback for blood. The patient tolerated the procedure well. Post procedure instructions were given. A followup appointment was made.

## 2021-06-08 NOTE — Patient Instructions (Signed)
You received aDysport injection today. You may experience soreness at the needle injection sites. Please call us if any of the injection sites turns red after a couple days or if there is any drainage. You may experience muscle weakness as a result of Dysport This would improve with time but can take several weeks to improve. The DYSPORTshould start working in about one week. The DYSPORT usually last 3 months. The injection can be repeated every 3 months as needed.

## 2021-06-09 ENCOUNTER — Encounter: Payer: Self-pay | Admitting: Family Medicine

## 2021-06-09 ENCOUNTER — Other Ambulatory Visit: Payer: Self-pay

## 2021-06-09 ENCOUNTER — Ambulatory Visit: Payer: Medicaid Other | Attending: Family Medicine | Admitting: Family Medicine

## 2021-06-09 VITALS — BP 156/80 | HR 68 | Ht 67.0 in | Wt 154.6 lb

## 2021-06-09 DIAGNOSIS — E1159 Type 2 diabetes mellitus with other circulatory complications: Secondary | ICD-10-CM | POA: Diagnosis not present

## 2021-06-09 DIAGNOSIS — Z23 Encounter for immunization: Secondary | ICD-10-CM | POA: Diagnosis not present

## 2021-06-09 DIAGNOSIS — E1169 Type 2 diabetes mellitus with other specified complication: Secondary | ICD-10-CM | POA: Diagnosis not present

## 2021-06-09 DIAGNOSIS — E785 Hyperlipidemia, unspecified: Secondary | ICD-10-CM

## 2021-06-09 DIAGNOSIS — G8110 Spastic hemiplegia affecting unspecified side: Secondary | ICD-10-CM | POA: Diagnosis not present

## 2021-06-09 DIAGNOSIS — I152 Hypertension secondary to endocrine disorders: Secondary | ICD-10-CM | POA: Diagnosis not present

## 2021-06-09 DIAGNOSIS — I6930 Unspecified sequelae of cerebral infarction: Secondary | ICD-10-CM

## 2021-06-09 DIAGNOSIS — I639 Cerebral infarction, unspecified: Secondary | ICD-10-CM | POA: Diagnosis not present

## 2021-06-09 DIAGNOSIS — Z794 Long term (current) use of insulin: Secondary | ICD-10-CM

## 2021-06-09 LAB — POCT GLYCOSYLATED HEMOGLOBIN (HGB A1C): HbA1c, POC (controlled diabetic range): 7.8 % — AB (ref 0.0–7.0)

## 2021-06-09 LAB — GLUCOSE, POCT (MANUAL RESULT ENTRY): POC Glucose: 168 mg/dl — AB (ref 70–99)

## 2021-06-09 MED ORDER — DAPAGLIFLOZIN PROPANEDIOL 10 MG PO TABS
ORAL_TABLET | Freq: Every day | ORAL | 1 refills | Status: DC
  Filled 2021-06-09: qty 30, 30d supply, fill #0

## 2021-06-09 MED ORDER — AMLODIPINE BESYLATE 10 MG PO TABS
ORAL_TABLET | Freq: Every day | ORAL | 1 refills | Status: DC
Start: 2021-06-09 — End: 2021-08-12
  Filled 2021-06-09: qty 90, 90d supply, fill #0

## 2021-06-09 MED ORDER — CARVEDILOL 12.5 MG PO TABS
ORAL_TABLET | ORAL | 1 refills | Status: DC
  Filled 2021-06-09: qty 180, 90d supply, fill #0

## 2021-06-09 MED ORDER — HYDROCHLOROTHIAZIDE 25 MG PO TABS
25.0000 mg | ORAL_TABLET | Freq: Every day | ORAL | 1 refills | Status: DC
Start: 2021-06-09 — End: 2021-12-09
  Filled 2021-06-09: qty 90, 90d supply, fill #0

## 2021-06-09 MED ORDER — HYDRALAZINE HCL 100 MG PO TABS
ORAL_TABLET | ORAL | 1 refills | Status: DC
  Filled 2021-06-09: qty 270, 90d supply, fill #0

## 2021-06-09 NOTE — Patient Instructions (Signed)
Diabetes Mellitus and Nutrition, Adult When you have diabetes, or diabetes mellitus, it is very important to have healthy eating habits because your blood sugar (glucose) levels are greatly affected by what you eat and drink. Eating healthy foods in the right amounts, at about the same times every day, can help you:  Control your blood glucose.  Lower your risk of heart disease.  Improve your blood pressure.  Reach or maintain a healthy weight. What can affect my meal plan? Every person with diabetes is different, and each person has different needs for a meal plan. Your health care provider may recommend that you work with a dietitian to make a meal plan that is best for you. Your meal plan may vary depending on factors such as:  The calories you need.  The medicines you take.  Your weight.  Your blood glucose, blood pressure, and cholesterol levels.  Your activity level.  Other health conditions you have, such as heart or kidney disease. How do carbohydrates affect me? Carbohydrates, also called carbs, affect your blood glucose level more than any other type of food. Eating carbs naturally raises the amount of glucose in your blood. Carb counting is a method for keeping track of how many carbs you eat. Counting carbs is important to keep your blood glucose at a healthy level, especially if you use insulin or take certain oral diabetes medicines. It is important to know how many carbs you can safely have in each meal. This is different for every person. Your dietitian can help you calculate how many carbs you should have at each meal and for each snack. How does alcohol affect me? Alcohol can cause a sudden decrease in blood glucose (hypoglycemia), especially if you use insulin or take certain oral diabetes medicines. Hypoglycemia can be a life-threatening condition. Symptoms of hypoglycemia, such as sleepiness, dizziness, and confusion, are similar to symptoms of having too much  alcohol.  Do not drink alcohol if: ? Your health care provider tells you not to drink. ? You are pregnant, may be pregnant, or are planning to become pregnant.  If you drink alcohol: ? Do not drink on an empty stomach. ? Limit how much you use to:  0-1 drink a day for women.  0-2 drinks a day for men. ? Be aware of how much alcohol is in your drink. In the [place] one drink equals one 12 oz bottle of beer (355 mL), one 5 oz glass of wine (148 mL), or one 1 oz glass of hard liquor (44 mL). ? Keep yourself hydrated with water, diet soda, or unsweetened iced tea.  Keep in mind that regular soda, juice, and other mixers may contain a lot of sugar and must be counted as carbs. What are tips for following this plan? Reading food labels  Start by checking the serving size on the "Nutrition Facts" label of packaged foods and drinks. The amount of calories, carbs, fats, and other nutrients listed on the label is based on one serving of the item. Many items contain more than one serving per package.  Check the total grams (g) of carbs in one serving. You can calculate the number of servings of carbs in one serving by dividing the total carbs by 15. For example, if a food has 30 g of total carbs per serving, it would be equal to 2 servings of carbs.  Check the number of grams (g) of saturated fats and trans fats in one serving. Choose foods that have   a low amount or none of these fats.  Check the number of milligrams (mg) of salt (sodium) in one serving. Most people should limit total sodium intake to less than 2,300 mg per day.  Always check the nutrition information of foods labeled as "low-fat" or "nonfat." These foods may be higher in added sugar or refined carbs and should be avoided.  Talk to your dietitian to identify your daily goals for nutrients listed on the label. Shopping  Avoid buying canned, pre-made, or processed foods. These foods tend to be high in fat, sodium, and added  sugar.  Shop around the outside edge of the grocery store. This is where you will most often find fresh fruits and vegetables, bulk grains, fresh meats, and fresh dairy. Cooking  Use low-heat cooking methods, such as baking, instead of high-heat cooking methods like deep frying.  Cook using healthy oils, such as olive, canola, or sunflower oil.  Avoid cooking with butter, cream, or high-fat meats. Meal planning  Eat meals and snacks regularly, preferably at the same times every day. Avoid going long periods of time without eating.  Eat foods that are high in fiber, such as fresh fruits, vegetables, beans, and whole grains. Talk with your dietitian about how many servings of carbs you can eat at each meal.  Eat 4-6 oz (112-168 g) of lean protein each day, such as lean meat, chicken, fish, eggs, or tofu. One ounce (oz) of lean protein is equal to: ? 1 oz (28 g) of meat, chicken, or fish. ? 1 egg. ?  cup (62 g) of tofu.  Eat some foods each day that contain healthy fats, such as avocado, nuts, seeds, and fish.   What foods should I eat? Fruits Berries. Apples. Oranges. Peaches. Apricots. Plums. Grapes. Mango. Papaya. Pomegranate. Kiwi. Cherries. Vegetables Lettuce. Spinach. Leafy greens, including kale, chard, collard greens, and mustard greens. Beets. Cauliflower. Cabbage. Broccoli. Carrots. Green beans. Tomatoes. Peppers. Onions. Cucumbers. Brussels sprouts. Grains Whole grains, such as whole-wheat or whole-grain bread, crackers, tortillas, cereal, and pasta. Unsweetened oatmeal. Quinoa. Brown or wild rice. Meats and other proteins Seafood. Poultry without skin. Lean cuts of poultry and beef. Tofu. Nuts. Seeds. Dairy Low-fat or fat-free dairy products such as milk, yogurt, and cheese. The items listed above may not be a complete list of foods and beverages you can eat. Contact a dietitian for more information. What foods should I avoid? Fruits Fruits canned with  syrup. Vegetables Canned vegetables. Frozen vegetables with butter or cream sauce. Grains Refined white flour and flour products such as bread, pasta, snack foods, and cereals. Avoid all processed foods. Meats and other proteins Fatty cuts of meat. Poultry with skin. Breaded or fried meats. Processed meat. Avoid saturated fats. Dairy Full-fat yogurt, cheese, or milk. Beverages Sweetened drinks, such as soda or iced tea. The items listed above may not be a complete list of foods and beverages you should avoid. Contact a dietitian for more information. Questions to ask a health care provider  Do I need to meet with a diabetes educator?  Do I need to meet with a dietitian?  What number can I call if I have questions?  When are the best times to check my blood glucose? Where to find more information:  American Diabetes Association: diabetes.org  Academy of Nutrition and Dietetics: www.eatright.org  National Institute of Diabetes and Digestive and Kidney Diseases: www.niddk.nih.gov  Association of Diabetes Care and Education Specialists: www.diabeteseducator.org Summary  It is important to have healthy eating   habits because your blood sugar (glucose) levels are greatly affected by what you eat and drink.  A healthy meal plan will help you control your blood glucose and maintain a healthy lifestyle.  Your health care provider may recommend that you work with a dietitian to make a meal plan that is best for you.  Keep in mind that carbohydrates (carbs) and alcohol have immediate effects on your blood glucose levels. It is important to count carbs and to use alcohol carefully. This information is not intended to replace advice given to you by your health care provider. Make sure you discuss any questions you have with your health care provider. Document Revised: 07/02/2019 Document Reviewed: 07/02/2019 Elsevier Patient Education  2021 Elsevier Inc.  

## 2021-06-09 NOTE — Progress Notes (Signed)
Subjective:  Patient ID: Casey Petty, male    DOB: 05-Feb-1961  Age: 60 y.o. MRN: [id number]  CC: Diabetes   HPI Casey Petty is a 60 y.o. year old male with a history of type 2 diabetes mellitus (A1c 7.8) stroke (with right spastic hemiparesis s/p Botox injections by rehab), right adhesive capsulitis, dysarthria, Hypertension.  Interval History: He has gotten his PCS services initiated  A1c is 7.8 up from 7.2 previously and has ben compliant with his medications. Fasting sugars have been around 110 and he has had hypoglycemia up to the 50s He does not like Metformin as it cause GI symptoms.  He recently had a left Botox injection in his L shoulder from [place]. Denies additional concerns today.  Past Medical History:  Diagnosis Date   Hypertension     Past Surgical History:  Procedure Laterality Date   NO PAST SURGERIES      Family History  Problem Relation Age of Onset   Heart attack Father    Cancer Neg Hx     Allergies  Allergen Reactions   Lisinopril Swelling    Angioedema- lips/face    Outpatient Medications Prior to Visit  Medication Sig Dispense Refill   acetaminophen (TYLENOL) 325 MG tablet Take 1-2 tablets (325-650 mg total) by mouth every 4 (four) hours as needed for mild pain.     aspirin 81 MG EC tablet Take 1 tablet (81 mg total) by mouth daily. 100 tablet 0   baclofen (LIORESAL) 20 MG tablet TAKE 1.5 TABLETS (30 MG TOTAL) BY MOUTH 3 (THREE) TIMES DAILY. 90 tablet 3   Blood Glucose Monitoring Suppl (ACCU-CHEK GUIDE) w/Device KIT 1 each by Does not apply route in the morning, at noon, in the evening, and at bedtime. 1 kit 0   Blood Glucose Monitoring Suppl (TRUE METRIX METER) w/Device KIT Check blood sugar fasting and before meals and again if pt feels bad (symptoms of hypo). 1 kit 0   buPROPion (WELLBUTRIN XL) 150 MG 24 hr tablet Take 1 tablet (150 mg total) by mouth daily. For smoking cessation. 30 tablet 3   clotrimazole (LOTRIMIN) 1 %  cream Apply to affected area 2 times daily for the next 7 days. 15 g 0   Continuous Blood Gluc Receiver (DEXCOM G6 RECEIVER) DEVI Use to check blood sugar TID. 1 each 0   Continuous Blood Gluc Sensor (DEXCOM G6 SENSOR) MISC Use to check blood sugar TID. 3 each 3   Continuous Blood Gluc Transmit (DEXCOM G6 TRANSMITTER) MISC Use to check blood sugar TID. 1 each 3   diclofenac Sodium (VOLTAREN) 1 % GEL Apply 2 g topically 4 (four) times daily. 350 g 1   ezetimibe (ZETIA) 10 MG tablet TAKE 1 TABLET (10 MG TOTAL) BY MOUTH DAILY. 90 tablet 3   ezetimibe (ZETIA) 10 MG tablet TAKE 1 TABLET (10 MG TOTAL) BY MOUTH DAILY. 90 tablet 1   famotidine (PEPCID) 20 MG tablet Take 1 tablet (20 mg total) by mouth daily. 30 tablet 0   glucose blood (ACCU-CHEK GUIDE) test strip Use as instructed up to 4 times daily 100 each 12   glucose blood (TRUE METRIX BLOOD GLUCOSE TEST) test strip Use as instructed before every meal nightly up to 4 times daily 100 each 12   insulin glargine (LANTUS) 100 UNIT/ML Solostar Pen INJECT 10 UNITS INTO THE SKIN DAILY. 30 mL 6   Insulin Pen Needle (TRUEPLUS PEN NEEDLES) 32G X 4 MM MISC Use to inject insulin. 100 each  2   Misc. Devices MISC Wheelchair with Accessories: elevating leg rests (ELRs), wheel locks, extensions and anti-tippers. Cane or walker will not suffice 1 each 0   Misc. Devices MISC Blood pressure monitor.  Diagnosis hypertension 1 each 0   rosuvastatin (CRESTOR) 40 MG tablet Take 1 tablet (40 mg total) by mouth daily. 30 tablet 4   tiZANidine (ZANAFLEX) 4 MG tablet Take 1 tablet (4 mg total) by mouth at bedtime. 30 tablet 1   TRUEplus Lancets 28G MISC Check blood sugar before every meal, nightly up to 4 times a day. 100 each 12   amLODipine (NORVASC) 10 MG tablet TAKE 1 TABLET (10 MG TOTAL) BY MOUTH DAILY. TO LOWER BLOOD PRESSURE 90 tablet 1   atorvastatin (LIPITOR) 80 MG tablet      carvedilol (COREG) 12.5 MG tablet TAKE 1 TABLET (12.5 MG TOTAL) BY MOUTH 2 (TWO) TIMES  DAILY WITH A MEAL. FOR BLOOD PRESSURE 180 tablet 1   dapagliflozin propanediol (FARXIGA) 10 MG TABS tablet TAKE 1 TABLET (10 MG TOTAL) BY MOUTH DAILY BEFORE BREAKFAST. 90 tablet 1   hydrALAZINE (APRESOLINE) 100 MG tablet TAKE 1 TABLET (100 MG TOTAL) BY MOUTH EVERY 8 (EIGHT) HOURS. TO LOWER BLOOD PRESSURE 270 tablet 1   No facility-administered medications prior to visit.     ROS Review of Systems  Constitutional:  Negative for activity change and appetite change.  HENT:  Negative for sinus pressure and sore throat.   Eyes:  Negative for visual disturbance.  Respiratory:  Negative for cough, chest tightness and shortness of breath.   Cardiovascular:  Negative for chest pain and leg swelling.  Gastrointestinal:  Negative for abdominal distention, abdominal pain, constipation and diarrhea.  Endocrine: Negative.   Genitourinary:  Negative for dysuria.  Musculoskeletal:  Negative for joint swelling and myalgias.  Skin:  Negative for rash.  Allergic/Immunologic: Negative.   Neurological:  Positive for weakness. Negative for light-headedness and numbness.  Psychiatric/Behavioral:  Negative for dysphoric mood and suicidal ideas.    Objective:  BP (!) 156/80   Pulse 68   Ht $R'5\' 7"'wm$  (1.702 m)   Wt 154 lb 9.6 oz (70.1 kg)   SpO2 98%   BMI 24.21 kg/m   BP/Weight 06/09/2021 06/08/2021 9/50/9326  Systolic BP 712 458 099  Diastolic BP 80 80 79  Wt. (Lbs) 154.6 157.8 157.2  BMI 24.21 24.71 24.62      Physical Exam Constitutional:      Appearance: He is well-developed.  Cardiovascular:     Rate and Rhythm: Normal rate.     Heart sounds: Normal heart sounds. No murmur heard. Pulmonary:     Effort: Pulmonary effort is normal.     Breath sounds: Normal breath sounds. No wheezing or rales.  Chest:     Chest wall: No tenderness.  Abdominal:     General: Bowel sounds are normal. There is no distension.     Palpations: Abdomen is soft. There is no mass.     Tenderness: There is no  abdominal tenderness.  Musculoskeletal:     Left lower leg: No edema.     Comments: R AFO brace  Neurological:     Mental Status: He is alert and oriented to person, place, and time.     Comments: R spastic hemiparesis  Psychiatric:        Mood and Affect: Mood normal.    CMP Latest Ref Rng & Units 09/01/2020 07/24/2019 05/08/2019  Glucose 65 - 99 mg/dL 389(H) 152(H) 134(H)  BUN 6 - 24 mg/dL $Remove'11 8 7  'IdJqzsa$ Creatinine 0.76 - 1.27 mg/dL 1.04 0.94 0.93  Sodium 134 - 144 mmol/L 139 141 138  Potassium 3.5 - 5.2 mmol/L 4.4 3.9 4.2  Chloride 96 - 106 mmol/L 101 103 100  CO2 20 - 29 mmol/L $RemoveB'23 23 23  'efhVtZgu$ Calcium 8.7 - 10.2 mg/dL 10.3(H) 10.3(H) 10.1  Total Protein 6.0 - 8.5 g/dL 7.2 7.1 7.2  Total Bilirubin 0.0 - 1.2 mg/dL 0.4 0.6 0.6  Alkaline Phos 44 - 121 IU/L 101 105 99  AST 0 - 40 IU/L $Remov'24 19 19  'FwKsWY$ ALT 0 - 44 IU/L 42 29 20    Lipid Panel     Component Value Date/Time   CHOL 145 02/24/2021 0826   TRIG 88 02/24/2021 0826   HDL 38 (L) 02/24/2021 0826   CHOLHDL 3.8 02/24/2021 0826   CHOLHDL 3.5 07/18/2014 0922   VLDL NOT CALC 07/18/2014 0922   LDLCALC 90 02/24/2021 0826    CBC    Component Value Date/Time   WBC 10.2 03/18/2019 1123   RBC 5.00 03/18/2019 1123   HGB 15.2 03/18/2019 1123   HCT 46.2 03/18/2019 1123   PLT 278 03/18/2019 1123   MCV 92.4 03/18/2019 1123   MCH 30.4 03/18/2019 1123   MCHC 32.9 03/18/2019 1123   RDW 12.8 03/18/2019 1123   LYMPHSABS 3.1 03/05/2019 0702   MONOABS 1.6 (H) 03/05/2019 0702   EOSABS 0.0 03/05/2019 0702   BASOSABS 0.0 03/05/2019 0034    Lab Results  Component Value Date   HGBA1C 7.8 (A) 06/09/2021    Assessment & Plan:  1. Type 2 diabetes mellitus with other specified complication, with long-term current use of insulin (HCC) Suboptimal control with A1c of 7.8; goal of 7.0 Due to episode of hypoglycemia I will hold off adjusting his regimen but encouraged to work on lifestyle modification Counseled on Diabetic diet, my plate method, 917  minutes of moderate intensity exercise/week Blood sugar logs with fasting goals of 80-120 mg/dl, random of less than 180 and in the event of sugars less than 60 mg/dl or greater than 400 mg/dl encouraged to notify the clinic. Advised on the need for annual eye exams, annual foot exams, Pneumonia vaccine. - POCT glucose (manual entry) - POCT glycosylated hemoglobin (Hb A1C) - Microalbumin / creatinine urine ratio; Future - dapagliflozin propanediol (FARXIGA) 10 MG TABS tablet; TAKE 1 TABLET (10 MG TOTAL) BY MOUTH DAILY BEFORE BREAKFAST.  Dispense: 90 tablet; Refill: 1 - Ambulatory referral to Podiatry  2. Hypertension associated with diabetes ([place]) Uncontrolled HCTZ added to regimen Counseled on blood pressure goal of less than 130/80, low-sodium, DASH diet, medication compliance, 150 minutes of moderate intensity exercise per week. Discussed medication compliance, adverse effects. - hydrochlorothiazide (HYDRODIURIL) 25 MG tablet; Take 1 tablet (25 mg total) by mouth daily.  Dispense: 90 tablet; Refill: 1 - Basic Metabolic Panel; Future - amLODipine (NORVASC) 10 MG tablet; TAKE 1 TABLET (10 MG TOTAL) BY MOUTH DAILY. TO LOWER BLOOD PRESSURE  Dispense: 90 tablet; Refill: 1 - carvedilol (COREG) 12.5 MG tablet; TAKE 1 TABLET (12.5 MG TOTAL) BY MOUTH 2 (TWO) TIMES DAILY WITH A MEAL. FOR BLOOD PRESSURE  Dispense: 180 tablet; Refill: 1 - hydrALAZINE (APRESOLINE) 100 MG tablet; TAKE 1 TABLET (100 MG TOTAL) BY MOUTH EVERY 8 (EIGHT) HOURS. TO LOWER BLOOD PRESSURE  Dispense: 270 tablet; Refill: 1  3. Spastic hemiparesis (HCC) With associated adhesive capsulitis S/p R shoulder Botox injection  4. History of CVA with residual deficit  With R hemiparesis Risk factor modifications Continue Statin  5. Need for immunization against influenza - Flu Vaccine QUAD 27mo+IM (Fluarix, Fluzone & Alfiuria Quad PF)     Meds ordered this encounter  Medications   hydrochlorothiazide (HYDRODIURIL) 25 MG tablet     Sig: Take 1 tablet (25 mg total) by mouth daily.    Dispense:  90 tablet    Refill:  1   amLODipine (NORVASC) 10 MG tablet    Sig: TAKE 1 TABLET (10 MG TOTAL) BY MOUTH DAILY. TO LOWER BLOOD PRESSURE    Dispense:  90 tablet    Refill:  1   carvedilol (COREG) 12.5 MG tablet    Sig: TAKE 1 TABLET (12.5 MG TOTAL) BY MOUTH 2 (TWO) TIMES DAILY WITH A MEAL. FOR BLOOD PRESSURE    Dispense:  180 tablet    Refill:  1   dapagliflozin propanediol (FARXIGA) 10 MG TABS tablet    Sig: TAKE 1 TABLET (10 MG TOTAL) BY MOUTH DAILY BEFORE BREAKFAST.    Dispense:  90 tablet    Refill:  1   hydrALAZINE (APRESOLINE) 100 MG tablet    Sig: TAKE 1 TABLET (100 MG TOTAL) BY MOUTH EVERY 8 (EIGHT) HOURS. TO LOWER BLOOD PRESSURE    Dispense:  270 tablet    Refill:  1    Follow-up: Return in about 3 months (around 09/09/2021) for medical conditions.       Charlott Rakes, MD, FAAFP. [facility] [place], [place]   06/09/2021, 2:08 PM

## 2021-06-10 DIAGNOSIS — I639 Cerebral infarction, unspecified: Secondary | ICD-10-CM | POA: Diagnosis not present

## 2021-06-11 DIAGNOSIS — I639 Cerebral infarction, unspecified: Secondary | ICD-10-CM | POA: Diagnosis not present

## 2021-06-12 DIAGNOSIS — I639 Cerebral infarction, unspecified: Secondary | ICD-10-CM | POA: Diagnosis not present

## 2021-06-14 ENCOUNTER — Encounter: Payer: Self-pay | Admitting: Podiatry

## 2021-06-14 ENCOUNTER — Other Ambulatory Visit: Payer: Self-pay

## 2021-06-14 ENCOUNTER — Ambulatory Visit (INDEPENDENT_AMBULATORY_CARE_PROVIDER_SITE_OTHER): Payer: Medicaid Other | Admitting: Podiatry

## 2021-06-14 DIAGNOSIS — L84 Corns and callosities: Secondary | ICD-10-CM | POA: Diagnosis not present

## 2021-06-14 DIAGNOSIS — E1165 Type 2 diabetes mellitus with hyperglycemia: Secondary | ICD-10-CM

## 2021-06-14 DIAGNOSIS — M2041 Other hammer toe(s) (acquired), right foot: Secondary | ICD-10-CM | POA: Diagnosis not present

## 2021-06-14 DIAGNOSIS — B351 Tinea unguium: Secondary | ICD-10-CM | POA: Diagnosis not present

## 2021-06-14 DIAGNOSIS — M79675 Pain in left toe(s): Secondary | ICD-10-CM

## 2021-06-14 DIAGNOSIS — I639 Cerebral infarction, unspecified: Secondary | ICD-10-CM | POA: Diagnosis not present

## 2021-06-14 DIAGNOSIS — E119 Type 2 diabetes mellitus without complications: Secondary | ICD-10-CM

## 2021-06-14 DIAGNOSIS — M2042 Other hammer toe(s) (acquired), left foot: Secondary | ICD-10-CM

## 2021-06-14 DIAGNOSIS — M79674 Pain in right toe(s): Secondary | ICD-10-CM

## 2021-06-14 NOTE — Patient Instructions (Signed)
Diabetes Mellitus and Foot Care Foot care is an important part of your health, especially when you have diabetes. Diabetes may cause you to have problems because of poor blood flow (circulation) to your feet and legs, which can cause your skin to: Become thinner and drier. Break more easily. Heal more slowly. Peel and crack. You may also have nerve damage (neuropathy) in your legs and feet, causing decreased feeling in them. This means that you may not notice minor injuries to your feet that could lead to more serious problems. Noticing and addressing any potential problems early is the best way to prevent future foot problems. How to care for your feet Foot hygiene  Wash your feet daily with warm water and mild soap. Do not use hot water. Then, pat your feet and the areas between your toes until they are completely dry. Do not soak your feet as this can dry your skin. Trim your toenails straight across. Do not dig under them or around the cuticle. File the edges of your nails with an emery board or nail file. Apply a moisturizing lotion or petroleum jelly to the skin on your feet and to dry, brittle toenails. Use lotion that does not contain alcohol and is unscented. Do not apply lotion between your toes. Shoes and socks Wear clean socks or stockings every day. Make sure they are not too tight. Do not wear knee-high stockings since they may decrease blood flow to your legs. Wear shoes that fit properly and have enough cushioning. Always look in your shoes before you put them on to be sure there are no objects inside. To break in new shoes, wear them for just a few hours a day. This prevents injuries on your feet. Wounds, scrapes, corns, and calluses  Check your feet daily for blisters, cuts, bruises, sores, and redness. If you cannot see the bottom of your feet, use a mirror or ask someone for help. Do not cut corns or calluses or try to remove them with medicine. If you find a minor scrape,  cut, or break in the skin on your feet, keep it and the skin around it clean and dry. You may clean these areas with mild soap and water. Do not clean the area with peroxide, alcohol, or iodine. If you have a wound, scrape, corn, or callus on your foot, look at it several times a day to make sure it is healing and not infected. Check for: Redness, swelling, or pain. Fluid or blood. Warmth. Pus or a bad smell. General tips Do not cross your legs. This may decrease blood flow to your feet. Do not use heating pads or hot water bottles on your feet. They may burn your skin. If you have lost feeling in your feet or legs, you may not know this is happening until it is too late. Protect your feet from hot and cold by wearing shoes, such as at the beach or on hot pavement. Schedule a complete foot exam at least once a year (annually) or more often if you have foot problems. Report any cuts, sores, or bruises to your health care provider immediately. Where to find more information American Diabetes Association: www.diabetes.org Association of Diabetes Care & Education Specialists: www.diabeteseducator.org Contact a health care provider if: You have a medical condition that increases your risk of infection and you have any cuts, sores, or bruises on your feet. You have an injury that is not healing. You have redness on your legs or feet. You   feel burning or tingling in your legs or feet. You have pain or cramps in your legs and feet. Your legs or feet are numb. Your feet always feel cold. You have pain around any toenails. Get help right away if: You have a wound, scrape, corn, or callus on your foot and: You have pain, swelling, or redness that gets worse. You have fluid or blood coming from the wound, scrape, corn, or callus. Your wound, scrape, corn, or callus feels warm to the touch. You have pus or a bad smell coming from the wound, scrape, corn, or callus. You have a fever. You have a red  line going up your leg. Summary Check your feet every day for blisters, cuts, bruises, sores, and redness. Apply a moisturizing lotion or petroleum jelly to the skin on your feet and to dry, brittle toenails. Wear shoes that fit properly and have enough cushioning. If you have foot problems, report any cuts, sores, or bruises to your health care provider immediately. Schedule a complete foot exam at least once a year (annually) or more often if you have foot problems. This information is not intended to replace advice given to you by your health care provider. Make sure you discuss any questions you have with your health care provider. Document Revised: 02/13/2020 Document Reviewed: 02/13/2020 Elsevier Patient Education  [place] are small areas of thickened skin that form on the top, sides, or tip of a toe. Corns have a cone-shaped core with a point that can press on a nerve below. This causes pain. Calluses are areas of thickened skin that can form anywhere on the body, including the hands, fingers, palms, soles of the feet, and heels. Calluses are usually larger than corns. What are the causes? Corns and calluses are caused by rubbing (friction) or pressure, such as from shoes that are too tight or do not fit properly. What increases the risk? Corns are more likely to develop in people who have misshapen toes (toe deformities), such as hammer toes. Calluses can form with friction to any area of the skin. They are more likely to develop in people who: Work with their hands. Wear shoes that fit poorly, are too tight, or are high-heeled. Have toe deformities. What are the signs or symptoms? Symptoms of a corn or callus include: A hard growth on the skin. Pain or tenderness under the skin. Redness and swelling. Increased discomfort while wearing tight-fitting shoes, if your feet are affected. If a corn or callus becomes infected, symptoms may  include: Redness and swelling that gets worse. Pain. Fluid, blood, or pus draining from the corn or callus. How is this diagnosed? Corns and calluses may be diagnosed based on your symptoms, your medical history, and a physical exam. How is this treated? Treatment for corns and calluses may include: Removing the cause of the friction or pressure. This may involve: Changing your shoes. Wearing shoe inserts (orthotics) or other protective layers in your shoes, such as a corn pad. Wearing gloves. Applying medicine to the skin (topical medicine) to help soften skin in the hardened, thickened areas. Removing layers of dead skin with a file to reduce the size of the corn or callus. Removing the corn or callus with a scalpel or laser. Taking antibiotic medicines, if your corn or callus is infected. Having surgery, if a toe deformity is the cause. Follow these instructions at home:  Take over-the-counter and prescription medicines only as told by your health  care provider. If you were prescribed an antibiotic medicine, take it as told by your health care provider. Do not stop taking it even if your condition improves. Wear shoes that fit well. Avoid wearing high-heeled shoes and shoes that are too tight or too loose. Wear any padding, protective layers, gloves, or orthotics as told by your health care provider. Soak your hands or feet. Then use a file or pumice stone to soften your corn or callus. Do this as told by your health care provider. Check your corn or callus every day for signs of infection. Contact a health care provider if: Your symptoms do not improve with treatment. You have redness or swelling that gets worse. Your corn or callus becomes painful. You have fluid, blood, or pus coming from your corn or callus. You have new symptoms. Get help right away if: You develop severe pain with redness. Summary Corns are small areas of thickened skin that form on the top, sides, or tip  of a toe. These can be painful. Calluses are areas of thickened skin that can form anywhere on the body, including the hands, fingers, palms, and soles of the feet. Calluses are usually larger than corns. Corns and calluses are caused by rubbing (friction) or pressure, such as from shoes that are too tight or do not fit properly. Treatment may include wearing padding, protective layers, gloves, or orthotics as told by your health care provider. This information is not intended to replace advice given to you by your health care provider. Make sure you discuss any questions you have with your health care provider. Document Revised: 11/21/2019 Document Reviewed: 11/21/2019 Elsevier Patient Education  2022 [employer].

## 2021-06-15 DIAGNOSIS — I639 Cerebral infarction, unspecified: Secondary | ICD-10-CM | POA: Diagnosis not present

## 2021-06-15 NOTE — Progress Notes (Signed)
Subjective: Casey Petty presents today referred by Charlott Rakes, MD for diabetic foot evaluation.  Patient relates two year history of diabetes.  Patient denies any history of foot wounds.  Patient denies any symptoms of numbness, tingling, burning or pins/needles sensation in his feet.  Patient relates blood glucose was 109 mg/dl on yesterday. Patient did not check blood glucose this morning.   PCP is Charlott Rakes, MD , and last visit was 06/09/2021.  Today, patient c/o of callus left foot and painful, discolored, thick toenails which interfere with daily activities.  Pain is aggravated when wearing enclosed shoe gear. Duration of condition: several months. Prior attempts at treatment include: None.  Casey Petty is accompanied by his fiance', Ms.Ralph Dowdy. They will be getting married on June 26, 2021.  Past Medical History:  Diagnosis Date   Hypertension     Patient Active Problem List   Diagnosis Date Noted   Chronic right shoulder pain 06/22/2020   Adhesive capsulitis of right shoulder 10/07/2019   Abnormality of gait 09/17/2019   Spastic hemiparesis of right dominant side ([place]) 07/02/2019   History of CVA with residual deficit 06/09/2019   Spastic hemiparesis ([facility])    Uncontrolled type 2 diabetes mellitus with hyperglycemia ([facility])    Noncompliance    Benign essential HTN    Newly diagnosed diabetes ([place])    Leucocytosis    Right hemiparesis ([place])    Basal ganglia stroke ([place]) 03/04/2019   Stroke ([place]) 02/28/2019   Essential hypertension 02/28/2019   Tobacco abuse 02/28/2019   Hyperglycemia 02/28/2019    Past Surgical History:  Procedure Laterality Date   NO PAST SURGERIES      Current Outpatient Medications on File Prior to Visit  Medication Sig Dispense Refill   acetaminophen (TYLENOL) 325 MG tablet Take 1-2 tablets (325-650 mg total) by mouth every 4 (four) hours as needed for mild pain.     amLODipine (NORVASC) 10 MG tablet TAKE 1 TABLET  (10 MG TOTAL) BY MOUTH DAILY. TO LOWER BLOOD PRESSURE 90 tablet 1   aspirin 81 MG EC tablet Take 1 tablet (81 mg total) by mouth daily. 100 tablet 0   baclofen (LIORESAL) 20 MG tablet TAKE 1.5 TABLETS (30 MG TOTAL) BY MOUTH 3 (THREE) TIMES DAILY. 90 tablet 3   Blood Glucose Monitoring Suppl (ACCU-CHEK GUIDE) w/Device KIT 1 each by Does not apply route in the morning, at noon, in the evening, and at bedtime. 1 kit 0   Blood Glucose Monitoring Suppl (TRUE METRIX METER) w/Device KIT Check blood sugar fasting and before meals and again if pt feels bad (symptoms of hypo). 1 kit 0   buPROPion (WELLBUTRIN XL) 150 MG 24 hr tablet Take 1 tablet (150 mg total) by mouth daily. For smoking cessation. 30 tablet 3   carvedilol (COREG) 12.5 MG tablet TAKE 1 TABLET (12.5 MG TOTAL) BY MOUTH 2 (TWO) TIMES DAILY WITH A MEAL. FOR BLOOD PRESSURE 180 tablet 1   clotrimazole (LOTRIMIN) 1 % cream Apply to affected area 2 times daily for the next 7 days. 15 g 0   Continuous Blood Gluc Receiver (DEXCOM G6 RECEIVER) DEVI Use to check blood sugar TID. 1 each 0   Continuous Blood Gluc Sensor (DEXCOM G6 SENSOR) MISC Use to check blood sugar TID. 3 each 3   Continuous Blood Gluc Transmit (DEXCOM G6 TRANSMITTER) MISC Use to check blood sugar TID. 1 each 3   dapagliflozin propanediol (FARXIGA) 10 MG TABS tablet TAKE 1 TABLET (10 MG TOTAL) BY MOUTH  DAILY BEFORE BREAKFAST. 90 tablet 1   diclofenac Sodium (VOLTAREN) 1 % GEL Apply 2 g topically 4 (four) times daily. 350 g 1   ezetimibe (ZETIA) 10 MG tablet TAKE 1 TABLET (10 MG TOTAL) BY MOUTH DAILY. 90 tablet 3   ezetimibe (ZETIA) 10 MG tablet TAKE 1 TABLET (10 MG TOTAL) BY MOUTH DAILY. 90 tablet 1   famotidine (PEPCID) 20 MG tablet Take 1 tablet (20 mg total) by mouth daily. 30 tablet 0   glucose blood (ACCU-CHEK GUIDE) test strip Use as instructed up to 4 times daily 100 each 12   glucose blood (TRUE METRIX BLOOD GLUCOSE TEST) test strip Use as instructed before every meal nightly up  to 4 times daily 100 each 12   hydrALAZINE (APRESOLINE) 100 MG tablet TAKE 1 TABLET (100 MG TOTAL) BY MOUTH EVERY 8 (EIGHT) HOURS. TO LOWER BLOOD PRESSURE 270 tablet 1   hydrochlorothiazide (HYDRODIURIL) 25 MG tablet Take 1 tablet (25 mg total) by mouth daily. 90 tablet 1   insulin glargine (LANTUS) 100 UNIT/ML Solostar Pen INJECT 10 UNITS INTO THE SKIN DAILY. 30 mL 6   Insulin Pen Needle (TRUEPLUS PEN NEEDLES) 32G X 4 MM MISC Use to inject insulin. 100 each 2   Misc. Devices MISC Wheelchair with Accessories: elevating leg rests (ELRs), wheel locks, extensions and anti-tippers. Cane or walker will not suffice 1 each 0   Misc. Devices MISC Blood pressure monitor.  Diagnosis hypertension 1 each 0   rosuvastatin (CRESTOR) 40 MG tablet Take 1 tablet (40 mg total) by mouth daily. 30 tablet 4   tiZANidine (ZANAFLEX) 4 MG tablet Take 1 tablet (4 mg total) by mouth at bedtime. 30 tablet 1   TRUEplus Lancets 28G MISC Check blood sugar before every meal, nightly up to 4 times a day. 100 each 12   No current facility-administered medications on file prior to visit.     Allergies  Allergen Reactions   Lisinopril Swelling    Angioedema- lips/face    Social History   Occupational History   Not on file  Tobacco Use   Smoking status: Every Day   Smokeless tobacco: Never   Tobacco comments:    Smoking 4 cigs/day  Vaping Use   Vaping Use: Never used  Substance and Sexual Activity   Alcohol use: Yes    Alcohol/week: 2.0 standard drinks    Types: 2 Standard drinks or equivalent per week   Drug use: No   Sexual activity: Never    Family History  Problem Relation Age of Onset   Heart attack Father    Cancer Neg Hx     Immunization History  Administered Date(s) Administered   Influenza,inj,Quad PF,6+ Mos 06/27/2014, 05/08/2019, 06/09/2021   Pneumococcal Polysaccharide-23 05/08/2019    Objective: There were no vitals filed for this visit.  Casey Petty is a pleasant 60 y.o. male WD,  WN in NAD. AAO X 3.  Vascular Examination: Capillary refill time to digits immediate b/l. Palpable DP pulse(s) b/l lower extremities Nonpalpable PT pulse(s) b/l lower extremities. Pedal hair absent. No pain with calf compression b/l. Lower extremity skin temperature gradient within normal limits. Trace edema noted right lower extremity.  Dermatological Examination: Pedal integument with normal turgor, texture and tone b/l LE. No open wounds b/l. No interdigital macerations b/l. Toenails 1-5 b/l elongated, thickened, discolored with subungual debris. +Tenderness with dorsal palpation of nailplates. Hyperkeratotic lesion(s) noted left foot. Toenails 1-5 b/l elongated, discolored, dystrophic, thickened, crumbly with subungual debris and tenderness to  dorsal palpation.  Musculoskeletal Examination: Muscle strength 5/5 to all LE muscle groups of left lower extremity. Muscle strength 3/5 to all LE muscle groups of right lower extremity. Wearing AFO on right lower extremity. Hammertoe deformity noted 2-5 b/l.  Footwear Assessment: Does the patient wear appropriate shoes? Yes. Does the patient need inserts/orthotics? No.  Neurological Examination: Protective sensation intact 5/5 intact bilaterally with 10g monofilament b/l. Vibratory sensation intact b/l. Clonus negative b/l.  Lab: Hemoglobin A1C Latest Ref Rng & Units 06/09/2021 02/24/2021 07/13/2020  HGBA1C 0.0 - 7.0 % 7.8(A) 7.2(H) 14.6(H)  Some recent data might be hidden   Assessment: 1. Pain due to onychomycosis of toenails of both feet   2. Callus   3. Acquired hammertoes of both feet   4. Uncontrolled type 2 diabetes mellitus with hyperglycemia ([place])   5. Encounter for diabetic foot exam ([place])     ADA Risk Categorization: High Risk:  Patient has one or more of the following: Loss of protective sensation Absent pedal pulses Severe Foot deformity History of foot ulcer  Plan: -Examined patient. -Medicaid ABN signed for this year.  Patient consents for services of paring of callus left foot today. Copy has been placed in patient chart. -Diabetic foot examination performed today. -Discussed diabetic foot care principles. Literature dispensed on today. -Continue diabetic foot care principles: inspect feet daily, monitor glucose as recommended by PCP and/or Endocrinologist, and follow prescribed diet per PCP, Endocrinologist and/or dietician. -Toenails 1-5 b/l were debrided in length and girth with sterile nail nippers and dremel without iatrogenic bleeding.  -Callus(es) left foot pared utilizing sterile scalpel blade without complication or incident. Total number debrided =1. -Patient/POA to call should there be question/concern in the interim.  Return in about 3 months (around 09/14/2021).  Marzetta Board, DPM

## 2021-06-16 ENCOUNTER — Other Ambulatory Visit: Payer: Self-pay

## 2021-06-16 ENCOUNTER — Ambulatory Visit: Payer: Medicaid Other | Attending: Family Medicine

## 2021-06-16 DIAGNOSIS — E1159 Type 2 diabetes mellitus with other circulatory complications: Secondary | ICD-10-CM

## 2021-06-16 DIAGNOSIS — Z794 Long term (current) use of insulin: Secondary | ICD-10-CM | POA: Diagnosis not present

## 2021-06-16 DIAGNOSIS — I152 Hypertension secondary to endocrine disorders: Secondary | ICD-10-CM

## 2021-06-16 DIAGNOSIS — I639 Cerebral infarction, unspecified: Secondary | ICD-10-CM | POA: Diagnosis not present

## 2021-06-16 DIAGNOSIS — E1169 Type 2 diabetes mellitus with other specified complication: Secondary | ICD-10-CM

## 2021-06-17 DIAGNOSIS — I639 Cerebral infarction, unspecified: Secondary | ICD-10-CM | POA: Diagnosis not present

## 2021-06-17 LAB — BASIC METABOLIC PANEL
BUN/Creatinine Ratio: 10 (ref 9–20)
BUN: 10 mg/dL (ref 6–24)
CO2: 23 mmol/L (ref 20–29)
Calcium: 9.6 mg/dL (ref 8.7–10.2)
Chloride: 104 mmol/L (ref 96–106)
Creatinine, Ser: 1.04 mg/dL (ref 0.76–1.27)
Glucose: 199 mg/dL — ABNORMAL HIGH (ref 70–99)
Potassium: 4.2 mmol/L (ref 3.5–5.2)
Sodium: 142 mmol/L (ref 134–144)
eGFR: 83 mL/min/{1.73_m2} (ref >59)

## 2021-06-17 LAB — MICROALBUMIN / CREATININE URINE RATIO
Creatinine, Urine: 63.9 mg/dL
Microalb/Creat Ratio: <5 mg/g creat (ref 0–29)
Microalbumin, Urine: <3 ug/mL

## 2021-06-18 ENCOUNTER — Telehealth: Payer: Self-pay

## 2021-06-18 DIAGNOSIS — I639 Cerebral infarction, unspecified: Secondary | ICD-10-CM | POA: Diagnosis not present

## 2021-06-18 NOTE — Telephone Encounter (Signed)
-----   Message from Charlott Rakes, MD sent at 06/18/2021  8:29 AM EST ----- Please inform the patient that labs are normal. Thank you.

## 2021-06-18 NOTE — Telephone Encounter (Signed)
Patient name and DOB has been verified Patient was informed of lab results. Patient had no questions.  

## 2021-06-19 DIAGNOSIS — I639 Cerebral infarction, unspecified: Secondary | ICD-10-CM | POA: Diagnosis not present

## 2021-06-21 DIAGNOSIS — I639 Cerebral infarction, unspecified: Secondary | ICD-10-CM | POA: Diagnosis not present

## 2021-06-22 DIAGNOSIS — I639 Cerebral infarction, unspecified: Secondary | ICD-10-CM | POA: Diagnosis not present

## 2021-06-23 DIAGNOSIS — I639 Cerebral infarction, unspecified: Secondary | ICD-10-CM | POA: Diagnosis not present

## 2021-06-24 DIAGNOSIS — I639 Cerebral infarction, unspecified: Secondary | ICD-10-CM | POA: Diagnosis not present

## 2021-06-25 DIAGNOSIS — I639 Cerebral infarction, unspecified: Secondary | ICD-10-CM | POA: Diagnosis not present

## 2021-06-26 DIAGNOSIS — I639 Cerebral infarction, unspecified: Secondary | ICD-10-CM | POA: Diagnosis not present

## 2021-06-28 DIAGNOSIS — I639 Cerebral infarction, unspecified: Secondary | ICD-10-CM | POA: Diagnosis not present

## 2021-06-29 DIAGNOSIS — I639 Cerebral infarction, unspecified: Secondary | ICD-10-CM | POA: Diagnosis not present

## 2021-06-30 ENCOUNTER — Other Ambulatory Visit: Payer: Self-pay

## 2021-06-30 ENCOUNTER — Other Ambulatory Visit: Payer: Self-pay | Admitting: *Deleted

## 2021-06-30 DIAGNOSIS — I639 Cerebral infarction, unspecified: Secondary | ICD-10-CM | POA: Diagnosis not present

## 2021-06-30 NOTE — Patient Instructions (Signed)
Visit Information  Mr. Corsino was given information about Medicaid Managed Care team care coordination services as a part of their [place] Medicaid benefit. Clayden Withem verbally consented to engagement with the [facility] Managed Care team.   If you are experiencing a medical emergency, please call 911 or report to your local emergency department or urgent care.   If you have a non-emergency medical problem during routine business hours, please contact your provider's office and ask to speak with a nurse.   For questions related to your [facility], please call: [phone] or visit the homepage here: [email or web address]  If you would like to schedule transportation through your [facility], please call the following number at least 2 days in advance of your appointment: [phone].   Call the [place] at [phone], at any time, 24 hours a day, 7 days a week. If you are in danger or need immediate medical attention call 911.  If you would like help to quit smoking, call 1-800-QUIT-NOW (716)642-3966) OR Espaol: 1-855-Djelo-Ya (4-503-888-2800) o para ms informacin haga clic aqu or Text READY to 200-400 to register via text  Mr. Haggart - following are the goals we discussed in your visit today:   Goals Addressed             This Visit's Progress    COMPLETED: Monitor and Manage My Blood Sugar-Diabetes Type 2       Resolving due to duplicate goal  Timeframe:  Long-Range Goal Priority:  High Start Date:  01/21/21                           Expected End Date:   06/30/21                    Follow Up Date 06/30/21    - call [facility] [phone] for dental evaluation - work with Care Guide for food resources - call PCP office and schedule a follow up appointment-scheduled for 06/09/21 - eat three small meals a day, adhering  to a diabetic diet - work with MM Pharmacist, for medication management - call [facility] for newly prescribed medication-tizanidine, to take at bedtime - check blood sugar at prescribed times - enter blood sugar readings and medication or insulin into daily log - take the blood sugar meter to all doctor visits    Why is this important?   Checking your blood sugar at home helps to keep it from getting very high or very low.  Writing the results in a diary or log helps the doctor know how to care for you.  Your blood sugar log should have the time, date and the results.  Also, write down the amount of insulin or other medicine that you take.  Other information, like what you ate, exercise done and how you were feeling, will also be helpful.             Please see education materials related to diabetes and hypertension provided as print materials.   The patient verbalized understanding of instructions provided today and agreed to receive a mailed copy of patient instruction and/or educational materials.  Telephone follow up appointment with Managed Medicaid care management team member scheduled for:07/27/21 @ [place] RN, [place] RN Care Coordinator   Following is a copy of your plan of care:  Care Plan :  Diabetes Type 2 (Adult)  Updates made by [name], RN since 06/30/2021 12:00 AM  Completed 06/30/2021   Problem: Glycemic Management (Diabetes, Type 2) Resolved 06/30/2021  Note:   Resolving due to duplicate goal     Long-Range Goal: Glycemic Management Optimized Completed 06/30/2021  Start Date: 01/21/2021  Expected End Date: 06/30/2021  Recent Progress: On track  Priority: High  Note:   Objective:  Lab Results  Component Value Date   HGBA1C 7.2 (H) 02/24/2021   Lab Results  Component Value Date   CREATININE 1.04 09/01/2020   CREATININE 0.94 07/24/2019   CREATININE 0.93 05/08/2019   No results found for:  EGFR Current Barriers:  Knowledge Deficits related to basic Diabetes pathophysiology and self care/management-Mr. 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 He has right side hemiparesis, DMII and HTN. His DM is poorly controlled. Patient is unable to check his blood sugar and does not like to be stuck. He is interested in CGM. Patient administers his medications, but has limited understanding of his medications. He is interested in having his prescriptions delivered to his home. Patient reports his wheelchair is not in good shape and needs to be replaced or repaired. He lives with his fiance who helps him with managing his care. Patient with recent A1C 7.2 from 14.6. Reports checking BS twice daily with readings between 106-118. He is eating lean meats with fruits and vegetables. He did receive food resources from [employer]. Patient received two resources to call and check on new wheelchair. Planning to call today. Mr. Lattin is using [name] and would like one of his own. He is working on walking with a cane and not using his wheelchair all of the time. He is working with his PCP for [employer].-Update-Mr. Demarinis has not received BP cuff, he continues to use his fiance's, reports last reading good, but unable to remember the reading. He is interested in smoking cessation, currently smokes 5 cig/day. He is getting married Nov 19.  Knowledge Deficits related to medications used for management of diabetes Difficulty obtaining or cannot afford medications Financial Constraints Cognitive Deficits Does not use cbg meter  Unable to independently monitor blood sugar Does not adhere to provider recommendations MC:[id number] blood sugar, maintaining a diabetic diet  Does not adhere to prescribed medication regimen Lacks social connections Does not contact provider office for questions/concerns Resolving due to duplicate goal Case Manager Clinical Goal(s):  patient will demonstrate  improved adherence to prescribed treatment plan for diabetes self care/management as evidenced by: adherence to ADA/ carb modified diet, adherence to prescribed medication regimen, contacting provider for new or worsened symptoms or questions Interventions:  Inter-disciplinary care team collaboration (see longitudinal plan of care) Provided education to patient about smoking cessation Discussed plans with patient for ongoing care management follow up and provided patient with direct contact information for care management team Review of patient status, including review of consultants reports, relevant laboratory and other test results, and medications completed. Reviewed scheduled appointments: 11/2 with PCP Encouraged patient to continue exercises provided during PT Reviewed medications-discussed calling pharmacy for new prescription, tizanidine Discussed the importance of dental care, provided information for [facility] [phone] Collaborate with PCP for BP cuff Congratulated Mr. Campos on his upcoming wedding Self-Care Activities - Self administers oral medications as prescribed Self administers insulin as prescribed Attends all scheduled provider appointments Adheres to prescribed ADA/carb modified Patient Goals: - call [facility] [phone] for dental evaluation - work with  Care Guide for food resources - call PCP office and schedule a follow up appointment-scheduled for 06/09/21 - eat three small meals a day, adhering to a diabetic diet - work with MM Pharmacist, for medication management - call [facility] for newly prescribed medication-tizanidine, to take at bedtime - check blood sugar at prescribed times - enter blood sugar readings and medication or insulin into daily log - take the blood sugar meter to all doctor visits  Follow Up Plan: Telephone follow up appointment with care management team member scheduled for:06/30/21 @ [place] : [place] of  Care  Updates made by [name], RN since 06/30/2021 12:00 AM     Problem: Chronic Disease Management needs related to DMII and HTN   Priority: High     Long-Range Goal: Development of Plan of Care for Chronic Disease Management related to DMII and HTN   Start Date: 06/30/2021  Expected End Date: 09/28/2021  Priority: High  Note:   Current Barriers:  Chronic Disease Management support and education needs related to HTN and DMII  RNCM Clinical Goal(s):  Patient will verbalize understanding of plan for management of HTN and DMII as evidenced by patient verbalization and self monitoring activities take all medications exactly as prescribed and will call provider for medication related questions as evidenced by documentation in EMR    attend all scheduled medical appointments: Dr. Letta Pate 08/12/21 @ 1pm, Dr. Darden Dates 08/31/21 @ 8:45am and Dr. Margarita Rana 09/13/21 @ 11:10am  as evidenced by physician notes in EMR        demonstrate improved adherence to prescribed treatment plan for HTN and DMII as evidenced by improved labs and self monitored readings continue to work with RN Care Manager and/or Social Worker to address care management and care coordination needs related to HTN and DMII as evidenced by adherence to CM Team Scheduled appointments     through collaboration with Consulting civil engineer, provider, and care team.   Interventions: Inter-disciplinary care team collaboration (see longitudinal plan of care) Evaluation of current treatment plan related to  self management and patient's adherence to plan as established by provider   Diabetes:  (Status: New goal.) Long Term Goal   Lab Results  Component Value Date   HGBA1C 7.8 (A) 06/09/2021  Assessed patient's understanding of A1c goal: <7% Provided education to patient about basic DM disease process; Reviewed medications with patient and discussed importance of medication adherence;        Reviewed prescribed diet with patient  diabetic; Discussed plans with patient for ongoing care management follow up and provided patient with direct contact information for care management team;      Provided patient with written educational materials related to hypo and hyperglycemia and importance of correct treatment;       Reviewed scheduled/upcoming provider appointments including: Dr. Letta Pate 08/12/21 @ 1pm, Dr. Darden Dates 08/31/20 @ 8:45am and Dr. Margarita Rana 09/13/21 @ 11:10am;         Advised patient, providing education and rationale, to check cbg once daily and when you have symptoms of low or high blood sugar and record        call provider for findings outside established parameters;       Review of patient status, including review of consultants reports, relevant laboratory and other test results, and medications completed;       Assessed social determinant of health barriers;         Hypertension: (Status: New goal.)  Last practice recorded BP readings:  BP Readings from Last 3 Encounters:  06/09/21 (!) 156/80  06/08/21 (!) 157/80  04/27/21 133/79  Most recent eGFR/CrCl:  Lab Results  Component Value Date   EGFR 83 06/16/2021    No components found for: CRCL  Evaluation of current treatment plan related to hypertension self management and patient's adherence to plan as established by provider;   Provided education to patient re: stroke prevention, s/s of heart attack and stroke; Reviewed prescribed diet dash Reviewed medications with patient and discussed importance of compliance;  Counseled on the importance of exercise goals with target of 150 minutes per week Discussed plans with patient for ongoing care management follow up and provided patient with direct contact information for care management team; Advised patient, providing education and rationale, to monitor blood pressure daily and record, calling PCP for findings outside established parameters;  Reviewed scheduled/upcoming provider appointments including:   Assessed social determinant of health barriers;   Patient Goals/Self-Care Activities: Take medications as prescribed   Attend all scheduled provider appointments Call pharmacy for medication refills 3-7 days in advance of running out of medications Call provider office for new concerns or questions  check blood sugar at prescribed times: once daily take the blood sugar meter to all doctor visits fill half of plate with vegetables keep a food diary check blood pressure daily write blood pressure results in a log or diary take blood pressure log to all doctor appointments call doctor for signs and symptoms of high blood pressure keep all doctor appointments take medications for blood pressure exactly as prescribed eat more whole grains, fruits and vegetables, lean meats and healthy fats

## 2021-06-30 NOTE — Patient Outreach (Signed)
Medicaid Managed Care   Nurse Care Manager Note  11/23/Petty Name:  Casey Petty MRN:  [id number] DOB:  23-Jan-1961  Casey Petty is an 60 y.o. year old male who is a primary patient of Casey Rakes, MD.  Casey Petty was consulted for assistance with:    HTN DMII  Casey Petty was given information about Medicaid Managed Care Coordination Petty services today. Casey Petty Patient agreed to services and verbal consent obtained.  Engaged with patient by telephone for follow up visit in response to provider referral for case management and/or care coordination services.   Assessments/Interventions:  Review of past medical history, allergies, medications, health status, including review of consultants reports, laboratory and other test data, was performed as part of comprehensive evaluation and provision of chronic care management services.  SDOH (Social Determinants of Health) assessments and interventions performed: SDOH Interventions    Flowsheet Row Most Recent Value  SDOH Interventions   Housing Interventions Intervention Not Indicated  Transportation Interventions Intervention Not Indicated       Care Plan  Allergies  Allergen Reactions   Lisinopril Swelling    Angioedema- lips/face    Medications Reviewed Today     Reviewed by Casey Montane, RN (Registered Nurse) on 06/30/21 at 0910  Med List Status: <None>   Medication Order Taking? Sig Documenting Provider Last Dose Status Informant  acetaminophen (TYLENOL) 325 MG tablet [id number]  Take 1-2 tablets (325-650 mg total) by mouth every 4 (four) hours as needed for mild pain. Casey Petty  Active            Med Note Casey Petty, Casey Petty   Tue Apr 19, Petty 10:30 AM) Prn only  amLODipine (NORVASC) 10 MG tablet [id number] Yes TAKE 1 TABLET (10 MG TOTAL) BY MOUTH DAILY. TO LOWER BLOOD PRESSURE Casey Rakes, MD Taking Active   aspirin 81 MG EC tablet [id number] Yes Take 1 tablet  (81 mg total) by mouth daily. Casey Rakes, MD Taking Active   baclofen (LIORESAL) 20 MG tablet [id number] Yes TAKE 1.5 TABLETS (30 MG TOTAL) BY MOUTH 3 (THREE) TIMES DAILY. Petty, Casey Salk, MD Taking Active   Blood Glucose Monitoring Suppl (ACCU-CHEK GUIDE) w/Device KIT [id number] Yes 1 each by Does not apply route in Casey morning, at noon, in Casey evening, and at bedtime. Casey Rakes, MD Taking Active   Blood Glucose Monitoring Suppl (TRUE METRIX METER) w/Device KIT [id number] Yes Check blood sugar fasting and before meals and again if pt feels bad (symptoms of hypo). Petty, Cammie, MD Taking Active   buPROPion (WELLBUTRIN XL) 150 MG 24 hr tablet [id number] Yes Take 1 tablet (150 mg total) by mouth daily. For smoking cessation. Casey Rakes, MD Taking Active   carvedilol (COREG) 12.5 MG tablet [id number] Yes TAKE 1 TABLET (12.5 MG TOTAL) BY MOUTH 2 (TWO) TIMES DAILY WITH A MEAL. FOR BLOOD PRESSURE Casey Rakes, MD Taking Active   clotrimazole (LOTRIMIN) 1 % cream [id number] Yes Apply to affected area 2 times daily for Casey next 7 days. Casey Fitting, Petty Taking Active   Continuous Blood Gluc Receiver ([place]) [place] [id number]  Use to check blood sugar TID. Casey Rakes, MD  Active   Continuous Blood Gluc Sensor (DEXCOM G6 SENSOR) MISC [id number]  Use to check blood sugar TID. Casey Rakes, MD  Active   Continuous Blood Gluc Transmit (DEXCOM G6 TRANSMITTER) MISC [id number]  Use to check blood sugar TID. Casey Rakes, MD  Active   dapagliflozin propanediol [place])  10 MG TABS tablet [id number] Yes TAKE 1 TABLET (10 MG TOTAL) BY MOUTH DAILY BEFORE BREAKFAST. Casey Rakes, MD Taking Active   diclofenac Sodium (VOLTAREN) 1 % GEL [id number] Yes Apply 2 g topically 4 (four) times daily. Casey Arn, MD Taking Active   ezetimibe (ZETIA) 10 MG tablet [id number] Yes TAKE 1 TABLET (10 MG TOTAL) BY MOUTH DAILY. Casey Rakes, MD Taking Active   ezetimibe (ZETIA) 10 MG tablet  [id number]  TAKE 1 TABLET (10 MG TOTAL) BY MOUTH DAILY. Casey Rakes, MD  Active            Med Note (Casey Petty A   Thu Jun 16, Petty  9:27 AM) Needs to pick up prescription  famotidine (PEPCID) 20 MG tablet [id number] Yes Take 1 tablet (20 mg total) by mouth daily. Casey Rakes, MD Taking Active   glucose blood (ACCU-CHEK GUIDE) test strip [id number]  Use as instructed up to 4 times daily Casey Rakes, MD  Active   glucose blood (TRUE METRIX BLOOD GLUCOSE TEST) test strip [id number]  Use as instructed before every meal nightly up to 4 times daily Casey Rakes, MD  Active   hydrALAZINE (APRESOLINE) 100 MG tablet [id number] Yes TAKE 1 TABLET (100 MG TOTAL) BY MOUTH EVERY 8 (EIGHT) HOURS. TO LOWER BLOOD PRESSURE Casey Rakes, MD Taking Active   hydrochlorothiazide (HYDRODIURIL) 25 MG tablet [id number] Yes Take 1 tablet (25 mg total) by mouth daily. Casey Rakes, MD Taking Active   insulin glargine (LANTUS) 100 UNIT/ML Solostar Pen [id number] Yes INJECT 10 UNITS INTO Casey SKIN DAILY. Casey Rakes, MD Taking Active   Insulin Pen Needle (TRUEPLUS PEN NEEDLES) 32G X 4 MM MISC [id number]  Use to inject insulin. Casey Rakes, MD  Active   Misc. Devices [place] [id number]  Wheelchair with Accessories: elevating leg rests (ELRs), wheel locks, extensions and anti-tippers. Cane or walker will not suffice Casey Rakes, MD  Active   Misc. Devices MISC [id number]  Blood pressure monitor.  Diagnosis hypertension Casey Rakes, MD  Active   rosuvastatin (CRESTOR) 40 MG tablet [id number] Yes Take 1 tablet (40 mg total) by mouth daily. Casey Rider, NP Taking Active   tiZANidine (ZANAFLEX) 4 MG tablet [id number] Yes Take 1 tablet (4 mg total) by mouth at bedtime. Casey Blake, MD Taking Active            Med Note Casey Petty, Casey Petty A   Wed Nov 23, Petty  9:10 AM)    TRUEplus Lancets 28G MISC [id number] Yes Check blood sugar before every meal, nightly up to 4 times a day. Casey Rakes, MD Taking  Active             Patient Active Problem List   Diagnosis Date Noted   Chronic right shoulder pain 06/22/2020   Adhesive capsulitis of right shoulder 10/07/2019   Abnormality of gait 09/17/2019   Spastic hemiparesis of right dominant side ([place]) 07/02/2019   History of CVA with residual deficit 06/09/2019   Spastic hemiparesis ([facility])    Uncontrolled type 2 diabetes mellitus with hyperglycemia ([facility])    Noncompliance    Benign essential HTN    Newly diagnosed diabetes ([place])    Leucocytosis    Right hemiparesis ([place])    Basal ganglia stroke ([place]) 03/04/2019   Stroke ([place]) 02/28/2019   Essential hypertension 02/28/2019   Tobacco abuse 02/28/2019   Hyperglycemia 02/28/2019    Conditions to be addressed/monitored per PCP order:  HTN and DMII  Care Plan : Diabetes Type 2 (Adult)  Updates made by Casey Montane, RN since 11/23/Petty 12:00 AM  Completed 11/23/Petty   Problem: Glycemic Management (Diabetes, Type 2) Resolved 11/23/Petty  Note:   Resolving due to duplicate goal     Long-Range Goal: Glycemic Management Optimized Completed 11/23/Petty  Start Date: 6/16/Petty  Expected End Date: 11/23/Petty  Recent Progress: On track  Priority: High  Note:   Objective:  Lab Results  Component Value Date   HGBA1C 7.2 (H) 07/20/Petty   Lab Results  Component Value Date   CREATININE 1.04 01/25/Petty   CREATININE 0.94 07/24/2019   CREATININE 0.93 05/08/2019   No results found for: EGFR Current Barriers:  Knowledge Deficits related to basic Diabetes pathophysiology and self care/management-Mr. Fosdick is managing his health after having a stroke in 2020. He has right side hemiparesis, DMII and HTN. His DM is poorly controlled. Patient is unable to check his blood sugar and does not like to be stuck. He is interested in CGM. Patient administers his medications, but has limited understanding of his medications. He is interested in having his prescriptions delivered to his home. Patient  reports his wheelchair is not in good shape and needs to be replaced or repaired. He lives with his fiance who helps him with managing his care. Patient with recent A1C 7.2 from 14.6. Reports checking BS twice daily with readings between 106-118. He is eating lean meats with fruits and vegetables. He did receive food resources from [place]. Patient received two resources to call and check on new wheelchair. Planning to call today. Mr. Brew is using [name] and would like one of his own. He is working on walking with a cane and not using his wheelchair all of Casey time. He is working with his PCP for [employer].-Update-Mr. Kroner has not received BP cuff, he continues to use his fiance's, reports last reading good, but unable to remember Casey reading. He is interested in smoking cessation, currently smokes 5 cig/day. He is getting married Nov 19.  Knowledge Deficits related to medications used for management of diabetes Difficulty obtaining or cannot afford medications Financial Constraints Cognitive Deficits Does not use cbg meter  Unable to independently monitor blood sugar Does not adhere to provider recommendations [id number] blood sugar, maintaining a diabetic diet  Does not adhere to prescribed medication regimen Lacks social connections Does not contact provider office for questions/concerns Resolving due to duplicate goal Case Manager Clinical Goal(s):  patient will demonstrate improved adherence to prescribed treatment plan for diabetes self care/management as evidenced by: adherence to ADA/ carb modified diet, adherence to prescribed medication regimen, contacting provider for new or worsened symptoms or questions Interventions:  Inter-disciplinary care Petty collaboration (see longitudinal plan of care) Provided education to patient about smoking cessation Discussed plans with patient for ongoing care management follow up and provided patient with direct contact information for  care management Petty Review of patient status, including review of consultants reports, relevant laboratory and other test results, and medications completed. Reviewed scheduled appointments: 11/2 with PCP Encouraged patient to continue exercises provided during PT Reviewed medications-discussed calling pharmacy for new prescription, tizanidine Discussed Casey importance of dental care, provided information for [facility] [phone] Collaborate with PCP for BP cuff Congratulated Mr. Delman on his upcoming wedding Self-Care Activities - Self administers oral medications as prescribed Self administers insulin as prescribed Attends all scheduled provider appointments Adheres to prescribed ADA/carb modified Patient Goals: - call [facility] [phone] for dental evaluation - work with Care Guide for food resources -  call PCP office and schedule a follow up appointment-scheduled for 06/09/21 - eat three small meals a day, adhering to a diabetic diet - work with [facility] Pharmacist, for medication management - call [facility] for newly prescribed medication-tizanidine, to take at bedtime - check blood sugar at prescribed times - enter blood sugar readings and medication or insulin into daily log - take Casey blood sugar meter to all doctor visits  Follow Up Plan: Telephone follow up appointment with care management Petty member scheduled for:06/30/21 @ 9am     Care Plan : RN Care Manager Plan of Care  Updates made by Heidi Dach, RN since 11/23/Petty 12:00 AM     Problem: Chronic Disease Management needs related to DMII and HTN   Priority: High     Long-Range Goal: Development of Plan of Care for Chronic Disease Management related to DMII and HTN   Start Date: 11/23/Petty  Expected End Date: 09/28/2021  Priority: High  Note:   Current Barriers:  Chronic Disease Management support and education needs related to HTN and DMII  RNCM Clinical Goal(s):  Patient will verbalize understanding  of plan for management of HTN and DMII as evidenced by patient verbalization and self monitoring activities take all medications exactly as prescribed and will call provider for medication related questions as evidenced by documentation in EMR    attend all scheduled medical appointments: Dr. Wynn Banker 08/12/21 @ 1pm, Dr. Lexine Baton 08/31/21 @ 8:45am and Dr. Alvis Lemmings 09/13/21 @ 11:10am  as evidenced by physician notes in EMR        demonstrate improved adherence to prescribed treatment plan for HTN and DMII as evidenced by improved labs and self monitored readings continue to work with RN Care Manager and/or Social Worker to address care management and care coordination needs related to HTN and DMII as evidenced by adherence to CM Petty Scheduled appointments     through collaboration with Medical illustrator, provider, and care Petty.   Interventions: Inter-disciplinary care Petty collaboration (see longitudinal plan of care) Evaluation of current treatment plan related to  self management and patient's adherence to plan as established by provider   Diabetes:  (Status: New goal.) Long Term Goal   Lab Results  Component Value Date   HGBA1C 7.8 (A) 11/02/Petty  Assessed patient's understanding of A1c goal: <7% Provided education to patient about basic DM disease process; Reviewed medications with patient and discussed importance of medication adherence;        Reviewed prescribed diet with patient diabetic; Discussed plans with patient for ongoing care management follow up and provided patient with direct contact information for care management Petty;      Provided patient with written educational materials related to hypo and hyperglycemia and importance of correct treatment;       Reviewed scheduled/upcoming provider appointments including: Dr. Wynn Banker 08/12/21 @ 1pm, Dr. Lexine Baton 08/31/20 @ 8:45am and Dr. Alvis Lemmings 09/13/21 @ 11:10am;         Advised patient, providing education and rationale, to check cbg once daily and  when you have symptoms of low or high blood sugar and record        call provider for findings outside established parameters;       Review of patient status, including review of consultants reports, relevant laboratory and other test results, and medications completed;       Assessed social determinant of health barriers;         Hypertension: (Status: New goal.) Last practice recorded BP readings:  BP Readings from Last 3 Encounters:  06/09/21 (!) 156/80  06/08/21 (!) 157/80  04/27/21 133/79  Most recent eGFR/CrCl:  Lab Results  Component Value Date   EGFR 83 11/09/Petty    No components found for: CRCL  Evaluation of current treatment plan related to hypertension self management and patient's adherence to plan as established by provider;   Provided education to patient re: stroke prevention, s/s of heart attack and stroke; Reviewed prescribed diet dash Reviewed medications with patient and discussed importance of compliance;  Counseled on Casey importance of exercise goals with target of 150 minutes per week Discussed plans with patient for ongoing care management follow up and provided patient with direct contact information for care management Petty; Advised patient, providing education and rationale, to monitor blood pressure daily and record, calling PCP for findings outside established parameters;  Reviewed scheduled/upcoming provider appointments including:  Assessed social determinant of health barriers;   Patient Goals/Self-Care Activities: Take medications as prescribed   Attend all scheduled provider appointments Call pharmacy for medication refills 3-7 days in advance of running out of medications Call provider office for new concerns or questions  check blood sugar at prescribed times: once daily take Casey blood sugar meter to all doctor visits fill half of plate with vegetables keep a food diary check blood pressure daily write blood pressure results in a log or  diary take blood pressure log to all doctor appointments call doctor for signs and symptoms of high blood pressure keep all doctor appointments take medications for blood pressure exactly as prescribed eat more whole grains, fruits and vegetables, lean meats and healthy fats       Follow Up:  Patient agrees to Care Plan and Follow-up.  Plan: Casey Managed Medicaid care management Petty will reach out to Casey patient again over Casey next 30 days.  Date/time of next scheduled RN care management/care coordination outreach:  07/27/21 @ [place] RN, BSN [place]  [employer] manager

## 2021-07-01 DIAGNOSIS — I639 Cerebral infarction, unspecified: Secondary | ICD-10-CM | POA: Diagnosis not present

## 2021-07-02 DIAGNOSIS — I639 Cerebral infarction, unspecified: Secondary | ICD-10-CM | POA: Diagnosis not present

## 2021-07-03 DIAGNOSIS — I639 Cerebral infarction, unspecified: Secondary | ICD-10-CM | POA: Diagnosis not present

## 2021-07-05 DIAGNOSIS — I639 Cerebral infarction, unspecified: Secondary | ICD-10-CM | POA: Diagnosis not present

## 2021-07-06 DIAGNOSIS — I639 Cerebral infarction, unspecified: Secondary | ICD-10-CM | POA: Diagnosis not present

## 2021-07-07 ENCOUNTER — Other Ambulatory Visit: Payer: Self-pay | Admitting: *Deleted

## 2021-07-07 DIAGNOSIS — I639 Cerebral infarction, unspecified: Secondary | ICD-10-CM | POA: Diagnosis not present

## 2021-07-07 MED ORDER — BACLOFEN 20 MG PO TABS: ORAL_TABLET | ORAL | 3 refills | Status: DC

## 2021-07-08 DIAGNOSIS — I639 Cerebral infarction, unspecified: Secondary | ICD-10-CM | POA: Diagnosis not present

## 2021-07-09 DIAGNOSIS — I639 Cerebral infarction, unspecified: Secondary | ICD-10-CM | POA: Diagnosis not present

## 2021-07-10 DIAGNOSIS — I639 Cerebral infarction, unspecified: Secondary | ICD-10-CM | POA: Diagnosis not present

## 2021-07-12 DIAGNOSIS — I639 Cerebral infarction, unspecified: Secondary | ICD-10-CM | POA: Diagnosis not present

## 2021-07-13 DIAGNOSIS — I639 Cerebral infarction, unspecified: Secondary | ICD-10-CM | POA: Diagnosis not present

## 2021-07-14 DIAGNOSIS — I639 Cerebral infarction, unspecified: Secondary | ICD-10-CM | POA: Diagnosis not present

## 2021-07-15 DIAGNOSIS — I639 Cerebral infarction, unspecified: Secondary | ICD-10-CM | POA: Diagnosis not present

## 2021-07-16 DIAGNOSIS — I639 Cerebral infarction, unspecified: Secondary | ICD-10-CM | POA: Diagnosis not present

## 2021-07-17 DIAGNOSIS — I639 Cerebral infarction, unspecified: Secondary | ICD-10-CM | POA: Diagnosis not present

## 2021-07-19 DIAGNOSIS — I639 Cerebral infarction, unspecified: Secondary | ICD-10-CM | POA: Diagnosis not present

## 2021-07-20 DIAGNOSIS — I639 Cerebral infarction, unspecified: Secondary | ICD-10-CM | POA: Diagnosis not present

## 2021-07-21 DIAGNOSIS — I639 Cerebral infarction, unspecified: Secondary | ICD-10-CM | POA: Diagnosis not present

## 2021-07-22 ENCOUNTER — Ambulatory Visit: Payer: Medicaid Other | Admitting: Physical Medicine & Rehabilitation

## 2021-07-22 ENCOUNTER — Other Ambulatory Visit: Payer: Self-pay

## 2021-07-22 DIAGNOSIS — I639 Cerebral infarction, unspecified: Secondary | ICD-10-CM | POA: Diagnosis not present

## 2021-07-22 MED ORDER — IBUPROFEN 800 MG PO TABS
800.0000 mg | ORAL_TABLET | Freq: Three times a day (TID) | ORAL | 0 refills | Status: DC | PRN
  Filled 2021-07-22: qty 20, 7d supply, fill #0

## 2021-07-22 MED ORDER — ACETAMINOPHEN-CODEINE #3 300-30 MG PO TABS
ORAL_TABLET | ORAL | 0 refills | Status: DC
  Filled 2021-07-22: qty 10, 3d supply, fill #0

## 2021-07-22 MED ORDER — AMOXICILLIN 500 MG PO CAPS
ORAL_CAPSULE | ORAL | 0 refills | Status: DC
  Filled 2021-07-22: qty 21, 7d supply, fill #0

## 2021-07-22 MED ORDER — CHLORHEXIDINE GLUCONATE 0.12 % MT SOLN
OROMUCOSAL | 0 refills | Status: DC
  Filled 2021-07-22: qty 473, 30d supply, fill #0

## 2021-07-23 DIAGNOSIS — I639 Cerebral infarction, unspecified: Secondary | ICD-10-CM | POA: Diagnosis not present

## 2021-07-24 DIAGNOSIS — I639 Cerebral infarction, unspecified: Secondary | ICD-10-CM | POA: Diagnosis not present

## 2021-07-26 DIAGNOSIS — I639 Cerebral infarction, unspecified: Secondary | ICD-10-CM | POA: Diagnosis not present

## 2021-07-27 ENCOUNTER — Other Ambulatory Visit: Payer: Self-pay

## 2021-07-27 ENCOUNTER — Other Ambulatory Visit: Payer: Self-pay | Admitting: *Deleted

## 2021-07-27 DIAGNOSIS — I639 Cerebral infarction, unspecified: Secondary | ICD-10-CM | POA: Diagnosis not present

## 2021-07-27 NOTE — Patient Outreach (Signed)
Medicaid Managed Care   Nurse Care Manager Note  07/27/2021 Name:  Mat Stuard MRN:  [id number] DOB:  17-Oct-1960  Rogan Wigley is an 60 y.o. year old male who is a primary patient of Charlott Rakes, MD.  The [facility] Managed Care Coordination team was consulted for assistance with:    HTN DMII  Mr. Balfour was given information about Medicaid Managed Care Coordination team services today. [name] Patient agreed to services and verbal consent obtained.  Engaged with patient by telephone for follow up visit in response to provider referral for case management and/or care coordination services.   Assessments/Interventions:  Review of past medical history, allergies, medications, health status, including review of consultants reports, laboratory and other test data, was performed as part of comprehensive evaluation and provision of chronic care management services.  SDOH (Social Determinants of Health) assessments and interventions performed: SDOH Interventions    Flowsheet Row Most Recent Value  SDOH Interventions   Transportation Interventions Intervention Not Indicated       Care Plan  Allergies  Allergen Reactions   Lisinopril Swelling    Angioedema- lips/face    Medications Reviewed Today     Reviewed by [name], RN (Registered Nurse) on 07/27/21 at [phone]  Med List Status: <None>   Medication Order Taking? Sig Documenting Provider Last Dose Status Informant  acetaminophen (TYLENOL) 325 MG tablet [id number] No Take 1-2 tablets (325-650 mg total) by mouth every 4 (four) hours as needed for mild pain.  Patient not taking: Reported on 07/27/2021   [name], [place] Not Taking Active            Med Note [name], [name]   Tue Nov 24, 2020 10:30 AM) Prn only  acetaminophen-codeine (TYLENOL #3) 300-30 MG tablet [id number] No Take 1 tablet by mouth every 6 hours (only as needed) for dental pain.  Patient not taking: Reported on 07/27/2021    Not Taking  Active   amLODipine (NORVASC) 10 MG tablet [id number] Yes TAKE 1 TABLET (10 MG TOTAL) BY MOUTH DAILY. TO LOWER BLOOD PRESSURE Charlott Rakes, MD Taking Active   amoxicillin (AMOXIL) 500 MG capsule [id number] No Take 1 capsule by mouth every 8 hours until finished  Patient not taking: Reported on 07/27/2021    Not Taking Active            Med Note (Lumen Brinlee A   Tue Jul 27, 2021  9:21 AM) Patient was unaware of this prescription, will pick up and start today  aspirin 81 MG EC tablet [id number] Yes Take 1 tablet (81 mg total) by mouth daily. Charlott Rakes, MD Taking Active   baclofen (LIORESAL) 20 MG tablet [id number] Yes TAKE 1.5 TABLETS (30 MG TOTAL) BY MOUTH 3 (THREE) TIMES DAILY. Kirsteins, Luanna Salk, MD Taking Active   Blood Glucose Monitoring Suppl (ACCU-CHEK GUIDE) w/Device KIT [id number] Yes 1 each by Does not apply route in the morning, at noon, in the evening, and at bedtime. Charlott Rakes, MD Taking Active   Blood Glucose Monitoring Suppl (TRUE METRIX METER) w/Device KIT [id number] Yes Check blood sugar fasting and before meals and again if pt feels bad (symptoms of hypo). Fulp, Cammie, MD Taking Active   buPROPion (WELLBUTRIN XL) 150 MG 24 hr tablet [id number] Yes Take 1 tablet (150 mg total) by mouth daily. For smoking cessation. Charlott Rakes, MD Taking Active   carvedilol (COREG) 12.5 MG tablet [id number] Yes TAKE 1 TABLET (12.5 MG TOTAL) BY MOUTH 2 (TWO) TIMES DAILY WITH A  MEAL. FOR BLOOD PRESSURE Charlott Rakes, MD Taking Active   chlorhexidine (PERIDEX) 0.12 % solution [id number] No Swish for one minute and spit out twice daily for 10 days.  Patient not taking: Reported on 07/27/2021    Not Taking Active            Med Note (Abdifatah Colquhoun A   Tue Jul 27, 2021  9:24 AM) Patient unaware of this prescription, will pick up and start today  clotrimazole (LOTRIMIN) 1 % cream [id number] No Apply to affected area 2 times daily for the next 7 days.  Patient not taking: Reported on  07/27/2021   [name], PA-C Not Taking Active            Med Note [name], Graiden Henes A   Tue Jul 27, 2021  9:24 AM) completed  Continuous Blood Gluc Receiver (DEXCOM G6 RECEIVER) DEVI [id number] No Use to check blood sugar TID.  Patient not taking: Reported on 07/27/2021   Charlott Rakes, MD Not Taking Active   Continuous Blood Gluc Sensor (DEXCOM G6 SENSOR) MISC [id number] No Use to check blood sugar TID.  Patient not taking: Reported on 07/27/2021   Charlott Rakes, MD Not Taking Active   Continuous Blood Gluc Transmit (DEXCOM G6 TRANSMITTER) MISC [id number] No Use to check blood sugar TID.  Patient not taking: Reported on 07/27/2021   Charlott Rakes, MD Not Taking Active   dapagliflozin propanediol (FARXIGA) 10 MG TABS tablet [id number] Yes TAKE 1 TABLET (10 MG TOTAL) BY MOUTH DAILY BEFORE BREAKFAST. Charlott Rakes, MD Taking Active   diclofenac Sodium (VOLTAREN) 1 % GEL [id number] No Apply 2 g topically 4 (four) times daily.  Patient not taking: Reported on 07/27/2021   [name], MD Not Taking Active   ezetimibe (ZETIA) 10 MG tablet [id number] Yes TAKE 1 TABLET (10 MG TOTAL) BY MOUTH DAILY. Charlott Rakes, MD Taking Active   ezetimibe (ZETIA) 10 MG tablet [id number] Yes TAKE 1 TABLET (10 MG TOTAL) BY MOUTH DAILY. Charlott Rakes, MD Taking Active            Med Note (Naheim Burgen A   Thu Jan 21, 2021  9:27 AM) Needs to pick up prescription  famotidine (PEPCID) 20 MG tablet [id number] Yes Take 1 tablet (20 mg total) by mouth daily. Charlott Rakes, MD Taking Active   glucose blood (ACCU-CHEK GUIDE) test strip [id number] Yes Use as instructed up to 4 times daily Charlott Rakes, MD Taking Active   glucose blood (TRUE METRIX BLOOD GLUCOSE TEST) test strip [id number] Yes Use as instructed before every meal nightly up to 4 times daily Charlott Rakes, MD Taking Active   hydrALAZINE (APRESOLINE) 100 MG tablet [id number] Yes TAKE 1 TABLET (100 MG TOTAL) BY MOUTH EVERY 8 (EIGHT) HOURS. TO  LOWER BLOOD PRESSURE Charlott Rakes, MD Taking Active   hydrochlorothiazide (HYDRODIURIL) 25 MG tablet [id number] Yes Take 1 tablet (25 mg total) by mouth daily. Charlott Rakes, MD Taking Active   ibuprofen (ADVIL) 800 MG tablet [id number] No Take 1 tablet every 8 hours as needed for pain  Patient not taking: Reported on 07/27/2021    Not Taking Active            Med Note (Ferron Ishmael A   Tue Jul 27, 2021  9:26 AM) Patient unaware of this prescription  insulin glargine (LANTUS) 100 UNIT/ML Solostar Pen [id number] Yes INJECT 10 UNITS INTO THE SKIN DAILY. Charlott Rakes, MD Taking Active   Insulin Pen Needle (TRUEPLUS PEN NEEDLES) 32G X 4  MM MISC [id number] Yes Use to inject insulin. Charlott Rakes, MD Taking Active   Misc. Devices [place] [id number]  Wheelchair with Accessories: elevating leg rests (ELRs), wheel locks, extensions and anti-tippers. Cane or walker will not suffice Charlott Rakes, MD  Active   Misc. Devices MISC [id number]  Blood pressure monitor.  Diagnosis hypertension Charlott Rakes, MD  Active   rosuvastatin (CRESTOR) 40 MG tablet [id number] Yes Take 1 tablet (40 mg total) by mouth daily. [name], NP Taking Active   tiZANidine (ZANAFLEX) 4 MG tablet [id number] Yes Take 1 tablet (4 mg total) by mouth at bedtime. Charlett Blake, MD Taking Active            Med Note [name], Arthi Mcdonald A   Wed Jun 30, 2021  9:10 AM)    TRUEplus Lancets 28G MISC [id number] Yes Check blood sugar before every meal, nightly up to 4 times a day. 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   Abnormality of gait 09/17/2019   Spastic hemiparesis of right dominant side ([place]) 07/02/2019   History of CVA with residual deficit 06/09/2019   Spastic hemiparesis (HCC)    Uncontrolled type 2 diabetes mellitus with hyperglycemia (HCC)    Noncompliance     Benign essential HTN    Newly diagnosed diabetes ([place])    Leucocytosis    Right hemiparesis ([place])    Basal ganglia stroke ([place]) 03/04/2019   Stroke ([place]) 02/28/2019   Essential hypertension 02/28/2019   Tobacco abuse 02/28/2019   Hyperglycemia 02/28/2019    Conditions to be addressed/monitored per PCP order:  HTN and DMII  Care Plan : RN Care Manager Plan of Care  Updates made by [name], RN since 07/27/2021 12:00 AM     Problem: Chronic Disease Management needs related to DMII and HTN   Priority: High     Long-Range Goal: Development of Plan of Care for Chronic Disease Management related to DMII and HTN   Start Date: 06/30/2021  Expected End Date: 09/28/2021  Priority: High  Note:   Current Barriers:  Chronic Disease Management support and education needs related to HTN and DMII Patient had 10 teeth pulled last week. He denies pain. He has been eating a soft diet, mainly soups. He was unaware of prescribed antibiotics and mouth wash to use post procedure.   RNCM Clinical Goal(s):  Patient will verbalize understanding of plan for management of HTN and DMII as evidenced by patient verbalization and self monitoring activities take all medications exactly as prescribed and will call provider for medication related questions as evidenced by documentation in EMR    attend all scheduled medical appointments: Dr. Letta Pate 08/12/21 @ 1pm, Dr. Darden Dates 08/31/21 @ 8:45am and Dr. Margarita Rana 09/13/21 @ 11:10am  as evidenced by physician notes in EMR        demonstrate improved adherence to prescribed treatment plan for HTN and DMII as evidenced by improved labs and self monitored readings continue to work with RN Care Manager and/or Social Worker to address care management and care coordination needs related to HTN and DMII as evidenced by adherence to CM Team Scheduled appointments     through collaboration with RN Care manager, provider, and care team.   Interventions: Inter-disciplinary care  team collaboration (see longitudinal plan of care) Evaluation of current treatment  plan related to  self management and patient's adherence to plan as established by provider   Diabetes:  (Status: Goal on Track (progressing): YES.) Long Term Goal   Lab Results  Component Value Date   HGBA1C 7.8 (A) 06/09/2021  Assessed patient's understanding of A1c goal: <7% Reviewed medications with patient and discussed importance of medication adherence;        Reviewed prescribed diet with patient diabetic diet; Discussed plans with patient for ongoing care management follow up and provided patient with direct contact information for care management team;      Provided patient with written educational materials related to hypo and hyperglycemia and importance of correct treatment;       Reviewed scheduled/upcoming provider appointments including: Dr. Letta Pate 08/12/21 @ 1pm, Dr. Darden Dates 08/31/20 @ 8:45am and Dr. Margarita Rana 09/13/21 @ 11:10am;         call provider for findings outside established parameters;       Review of patient status, including review of consultants reports, relevant laboratory and other test results, and medications completed;       Assessed social determinant of health barriers;        Advised patient to pick up antibiotics and mouth rinse and start using, discussed the importance of both Last blood sugar at home 89-before bed-discussed eating a protein snack before bed when blood sugar this low  Hypertension: (Status: Goal on Track (progressing): YES.) Last practice recorded BP readings:  BP Readings from Last 3 Encounters:  06/09/21 (!) 156/80  06/08/21 (!) 157/80  04/27/21 133/79  Most recent eGFR/CrCl:  Lab Results  Component Value Date   EGFR 83 06/16/2021    No components found for: CRCL  Reviewed medications with patient and discussed importance of compliance;  Counseled on the importance of exercise goals with target of 150 minutes per week Discussed plans with patient  for ongoing care management follow up and provided patient with direct contact information for care management team; Advised patient, providing education and rationale, to monitor blood pressure daily and record, calling PCP for findings outside established parameters;  Provided education on prescribed diet dash;  Discussed complications of poorly controlled blood pressure such as heart disease, stroke, circulatory complications, vision complications, kidney impairment, sexual dysfunction;  Assessed social determinant of health barriers;  Last BP at home 120/96  Patient Goals/Self-Care Activities: Take medications as prescribed   Attend all scheduled provider appointments Call pharmacy for medication refills 3-7 days in advance of running out of medications Call provider office for new concerns or questions  check blood sugar at prescribed times: once daily take the blood sugar meter to all doctor visits fill half of plate with vegetables keep a food diary check blood pressure daily write blood pressure results in a log or diary take blood pressure log to all doctor appointments call doctor for signs and symptoms of high blood pressure keep all doctor appointments take medications for blood pressure exactly as prescribed eat more whole grains, fruits and vegetables, lean meats and healthy fats       Follow Up:  Patient agrees to Care Plan and Follow-up.  Plan: The Managed Medicaid care management team will reach out to the patient again over the next 30 days.  Date/time of next scheduled RN care management/care coordination outreach:  09/01/21 @ [place] RN, BSN [place] RN Care Coordinator

## 2021-07-27 NOTE — Patient Instructions (Signed)
Visit Information  Casey Petty was given information about Medicaid Managed Care team care coordination services as a part of their [place] Medicaid benefit. Casey Petty verbally consented to engagement with the [facility] Managed Care team.   If you are experiencing a medical emergency, please call 911 or report to your local emergency department or urgent care.   If you have a non-emergency medical problem during routine business hours, please contact your provider's office and ask to speak with a nurse.   For questions related to your [facility], please call: [phone] or visit the homepage here: [email or web address]  If you would like to schedule transportation through your [facility], please call the following number at least 2 days in advance of your appointment: [phone].   Call the [place] at [phone], at any time, 24 hours a day, 7 days a week. If you are in danger or need immediate medical attention call 911.  If you would like help to quit smoking, call 1-800-QUIT-NOW 570-110-9486) OR Espaol: 1-855-Djelo-Ya (3-614-431-5400) o para ms informacin haga clic aqu or Text READY to 200-400 to register via text  Casey Petty - following are the goals we discussed in your visit today:   Goals Addressed   None     Please see education materials related to HTN, DM and oral care provided as print materials.   The patient verbalized understanding of instructions provided today and agreed to receive a mailed copy of patient instruction and/or educational materials.  Telephone follow up appointment with Managed Medicaid care management team member scheduled for:09/01/21 @ [place] RN, BSN [place] RN Care Coordinator   Following is a copy of your plan of care:  Care Plan : RN Care  Manager Plan of Care  Updates made by [name], RN since 07/27/2021 12:00 AM     Problem: Chronic Disease Management needs related to DMII and HTN   Priority: High     Long-Range Goal: Development of Plan of Care for Chronic Disease Management related to DMII and HTN   Start Date: 06/30/2021  Expected End Date: 09/28/2021  Priority: High  Note:   Current Barriers:  Chronic Disease Management support and education needs related to HTN and DMII Patient had 10 teeth pulled last week. He denies pain. He has been eating a soft diet, mainly soups. He was unaware of prescribed antibiotics and mouth wash to use post procedure.   RNCM Clinical Goal(s):  Patient will verbalize understanding of plan for management of HTN and DMII as evidenced by patient verbalization and self monitoring activities take all medications exactly as prescribed and will call provider for medication related questions as evidenced by documentation in EMR    attend all scheduled medical appointments: Dr. Letta Pate 08/12/21 @ 1pm, Dr. Darden Dates 08/31/21 @ 8:45am and Dr. Margarita Rana 09/13/21 @ 11:10am  as evidenced by physician notes in EMR        demonstrate improved adherence to prescribed treatment plan for HTN and DMII as evidenced by improved labs and self monitored readings continue to work with RN Care Manager and/or Social Worker to address care management and care coordination needs related to HTN and DMII as evidenced by adherence to CM Team Scheduled appointments     through collaboration with RN Care manager, provider, and care team.   Interventions: Inter-disciplinary care team collaboration (see longitudinal plan of care) Evaluation of current  treatment plan related to  self management and patient's adherence to plan as established by provider   Diabetes:  (Status: Goal on Track (progressing): YES.) Long Term Goal   Lab Results  Component Value Date   HGBA1C 7.8 (A) 06/09/2021  Assessed patient's understanding of  A1c goal: <7% Reviewed medications with patient and discussed importance of medication adherence;        Reviewed prescribed diet with patient diabetic diet; Discussed plans with patient for ongoing care management follow up and provided patient with direct contact information for care management team;      Provided patient with written educational materials related to hypo and hyperglycemia and importance of correct treatment;       Reviewed scheduled/upcoming provider appointments including: Dr. Letta Pate 08/12/21 @ 1pm, Dr. Darden Dates 08/31/20 @ 8:45am and Dr. Margarita Rana 09/13/21 @ 11:10am;         call provider for findings outside established parameters;       Review of patient status, including review of consultants reports, relevant laboratory and other test results, and medications completed;       Assessed social determinant of health barriers;        Advised patient to pick up antibiotics and mouth rinse and start using, discussed the importance of both Last blood sugar at home 89-before bed-discussed eating a protein snack before bed when blood sugar this low  Hypertension: (Status: Goal on Track (progressing): YES.) Last practice recorded BP readings:  BP Readings from Last 3 Encounters:  06/09/21 (!) 156/80  06/08/21 (!) 157/80  04/27/21 133/79  Most recent eGFR/CrCl:  Lab Results  Component Value Date   EGFR 83 06/16/2021    No components found for: CRCL  Reviewed medications with patient and discussed importance of compliance;  Counseled on the importance of exercise goals with target of 150 minutes per week Discussed plans with patient for ongoing care management follow up and provided patient with direct contact information for care management team; Advised patient, providing education and rationale, to monitor blood pressure daily and record, calling PCP for findings outside established parameters;  Provided education on prescribed diet dash;  Discussed complications of poorly  controlled blood pressure such as heart disease, stroke, circulatory complications, vision complications, kidney impairment, sexual dysfunction;  Assessed social determinant of health barriers;  Last BP at home 120/96  Patient Goals/Self-Care Activities: Take medications as prescribed   Attend all scheduled provider appointments Call pharmacy for medication refills 3-7 days in advance of running out of medications Call provider office for new concerns or questions  check blood sugar at prescribed times: once daily take the blood sugar meter to all doctor visits fill half of plate with vegetables keep a food diary check blood pressure daily write blood pressure results in a log or diary take blood pressure log to all doctor appointments call doctor for signs and symptoms of high blood pressure keep all doctor appointments take medications for blood pressure exactly as prescribed eat more whole grains, fruits and vegetables, lean meats and healthy fats

## 2021-07-28 DIAGNOSIS — I639 Cerebral infarction, unspecified: Secondary | ICD-10-CM | POA: Diagnosis not present

## 2021-07-29 ENCOUNTER — Other Ambulatory Visit: Payer: Self-pay

## 2021-07-29 DIAGNOSIS — I639 Cerebral infarction, unspecified: Secondary | ICD-10-CM | POA: Diagnosis not present

## 2021-07-30 DIAGNOSIS — I639 Cerebral infarction, unspecified: Secondary | ICD-10-CM | POA: Diagnosis not present

## 2021-07-31 DIAGNOSIS — I639 Cerebral infarction, unspecified: Secondary | ICD-10-CM | POA: Diagnosis not present

## 2021-08-02 ENCOUNTER — Other Ambulatory Visit: Payer: Self-pay | Admitting: Family Medicine

## 2021-08-05 ENCOUNTER — Other Ambulatory Visit: Payer: Self-pay | Admitting: Family Medicine

## 2021-08-05 DIAGNOSIS — I6930 Unspecified sequelae of cerebral infarction: Secondary | ICD-10-CM

## 2021-08-05 DIAGNOSIS — I152 Hypertension secondary to endocrine disorders: Secondary | ICD-10-CM

## 2021-08-05 DIAGNOSIS — Z720 Tobacco use: Secondary | ICD-10-CM

## 2021-08-05 DIAGNOSIS — Z794 Long term (current) use of insulin: Secondary | ICD-10-CM

## 2021-08-05 NOTE — Telephone Encounter (Signed)
Requested Prescriptions  Refused Prescriptions Disp Refills   atorvastatin (LIPITOR) 80 MG tablet [Pharmacy Med Name: atorvastatin 80 mg tablet] 90 tablet 1    Sig: TAKE ONE TABLET BY MOUTH EVERY EVENING FOR CHOLESTEROL     Cardiovascular:  Antilipid - Statins Failed - 08/05/2021  8:02 AM      Failed - HDL in normal range and within 360 days    HDL  Date Value Ref Range Status  02/24/2021 38 (L) >39 mg/dL Final         Passed - Total Cholesterol in normal range and within 360 days    Cholesterol, Total  Date Value Ref Range Status  02/24/2021 145 100 - 199 mg/dL Final         Passed - LDL in normal range and within 360 days    LDL Chol Calc (NIH)  Date Value Ref Range Status  02/24/2021 90 0 - 99 mg/dL Final         Passed - Triglycerides in normal range and within 360 days    Triglycerides  Date Value Ref Range Status  02/24/2021 88 0 - 149 mg/dL Final         Passed - Patient is not pregnant      Passed - Valid encounter within last 12 months    Recent Outpatient Visits          1 month ago Type 2 diabetes mellitus with other specified complication, with long-term current use of insulin ([place])   [place], Charlane Ferretti, MD   3 months ago Spastic hemiparesis ([place])   [place], [place], MD   6 months ago Uncontrolled type 2 diabetes mellitus with hyperglycemia ([place])   [place], [place], MD   11 months ago Uncontrolled type 2 diabetes mellitus with hyperglycemia ([place])   [place], [place], MD   2 years ago Uncontrolled type 2 diabetes mellitus with hyperglycemia ([place])   [place] Fulp, [place], MD      Future Appointments            In 1 month Newlin, [place], MD [place]            amLODipine (NORVASC) 10 MG tablet [Pharmacy Med Name: amlodipine 10  mg tablet] 90 tablet 1    Sig: TAKE ONE TABLET BY MOUTH EVERY MORNING FOR BLOOD PRESSURE     Cardiovascular:  Calcium Channel Blockers Failed - 08/05/2021  8:02 AM      Failed - Last BP in normal range    BP Readings from Last 1 Encounters:  06/09/21 (!) 156/80         Passed - Valid encounter within last 6 months    Recent Outpatient Visits          1 month ago Type 2 diabetes mellitus with other specified complication, with long-term current use of insulin ([place])   [place], Charlane Ferretti, MD   3 months ago Spastic hemiparesis [facility])   [place], [place], MD   6 months ago Uncontrolled type 2 diabetes mellitus with hyperglycemia ([place])   [place], [place], MD   11 months ago Uncontrolled type 2 diabetes mellitus with hyperglycemia [facility])   [facility] Charlott Rakes, MD   2  years ago Uncontrolled type 2 diabetes mellitus with hyperglycemia ([place])   [place], MD      Future Appointments            In 1 month Newlin, [place], MD [place]            buPROPion (WELLBUTRIN XL) 150 MG 24 hr tablet [Pharmacy Med Name: bupropion HCl XL 150 mg 24 hr tablet, extended release] 30 tablet 3    Sig: TAKE ONE TABLET BY MOUTH ONCE DAILY     Psychiatry: Antidepressants - bupropion Failed - 08/05/2021  8:02 AM      Failed - Last BP in normal range    BP Readings from Last 1 Encounters:  06/09/21 (!) 156/80         Passed - Valid encounter within last 6 months    Recent Outpatient Visits          1 month ago Type 2 diabetes mellitus with other specified complication, with long-term current use of insulin ([place])   [place], Charlane Ferretti, MD   3 months ago Spastic hemiparesis ([place])   [place],  [place], MD   6 months ago Uncontrolled type 2 diabetes mellitus with hyperglycemia ([place])   [place], [place], MD   11 months ago Uncontrolled type 2 diabetes mellitus with hyperglycemia ([place])   [place], Charlane Ferretti, MD   2 years ago Uncontrolled type 2 diabetes mellitus with hyperglycemia ([place])   [place] Fulp, [place], MD      Future Appointments            In 1 month Margarita Rana, [place], MD [place]            carvedilol (COREG) 12.5 MG tablet [Pharmacy Med Name: carvedilol 12.5 mg tablet] 180 tablet 1    Sig: TAKE ONE TABLET BY MOUTH EVERY MORNING and TAKE ONE TABLET BY MOUTH EVERY EVENING     Cardiovascular:  Beta Blockers Failed - 08/05/2021  8:02 AM      Failed - Last BP in normal range    BP Readings from Last 1 Encounters:  06/09/21 (!) 156/80         Passed - Last Heart Rate in normal range    Pulse Readings from Last 1 Encounters:  06/09/21 68         Passed - Valid encounter within last 6 months    Recent Outpatient Visits          1 month ago Type 2 diabetes mellitus with other specified complication, with long-term current use of insulin ([place])   [place], Charlane Ferretti, MD   3 months ago Spastic hemiparesis ([place])   [place], [place], MD   6 months ago Uncontrolled type 2 diabetes mellitus with hyperglycemia ([place])   [place], [place], MD   11 months ago Uncontrolled type 2 diabetes mellitus with hyperglycemia [facility])   [place], Charlane Ferretti, MD   2 years ago Uncontrolled type 2 diabetes mellitus with hyperglycemia ([place])   [place] [name], MD      Future Appointments            In  1 month Charlott Rakes, MD [place] 10 MG TABS tablet [Pharmacy Med Name: [place] 10 mg tablet] 90 tablet 1    Sig: TAKE ONE TABLET BY MOUTH EVERY MORNING     Endocrinology:  Diabetes - SGLT2 Inhibitors Passed - 08/05/2021  8:02 AM      Passed - Cr in normal range and within 360 days    Creat  Date Value Ref Range Status  07/18/2014 1.14 0.50 - 1.35 mg/dL Final   Creatinine, Ser  Date Value Ref Range Status  06/16/2021 1.04 0.76 - 1.27 mg/dL Final         Passed - LDL in normal range and within 360 days    LDL Chol Calc (NIH)  Date Value Ref Range Status  02/24/2021 90 0 - 99 mg/dL Final         Passed - HBA1C is between 0 and 7.9 and within 180 days    HbA1c, POC (controlled diabetic range)  Date Value Ref Range Status  06/09/2021 7.8 (A) 0.0 - 7.0 % Final         Passed - AA eGFR in normal range and within 360 days    GFR, Est African American  Date Value Ref Range Status  07/18/2014 86 mL/min Final   GFR calc Af Amer  Date Value Ref Range Status  09/01/2020 92 >59 mL/min/1.73 Final    Comment:    **In accordance with recommendations from the NKF-ASN Task force,**   [facility] is in the process of updating its eGFR calculation to the   2021 CKD-EPI creatinine equation that estimates kidney function   without a race variable.    GFR, Est Non African American  Date Value Ref Range Status  07/18/2014 74 mL/min Final    Comment:      The estimated GFR is a calculation valid for adults (>=56 years old) that uses the CKD-EPI algorithm to adjust for age and sex. It is   not to be used for children, pregnant women, hospitalized patients,    patients on dialysis, or with rapidly changing kidney function. According to the NKDEP, eGFR >89 is normal, 60-89 shows mild impairment, 30-59 shows moderate impairment, 15-29 shows severe impairment and <15 is ESRD.      GFR calc non Af Amer  Date Value Ref Range Status  09/01/2020 79 >59 mL/min/1.73 Final   eGFR  Date  Value Ref Range Status  06/16/2021 83 >59 mL/min/1.73 Final         Passed - Valid encounter within last 6 months    Recent Outpatient Visits          1 month ago Type 2 diabetes mellitus with other specified complication, with long-term current use of insulin ([place])   [place], Charlane Ferretti, MD   3 months ago Spastic hemiparesis [facility])   [place], Charlane Ferretti, MD   6 months ago Uncontrolled type 2 diabetes mellitus with hyperglycemia [facility])   [place], Charlane Ferretti, MD   11 months ago Uncontrolled type 2 diabetes mellitus with hyperglycemia [facility])   [place], Charlane Ferretti, MD   2 years ago Uncontrolled type 2 diabetes mellitus with hyperglycemia ([place])   [place] [name], MD      Future Appointments  In 1 month Charlott Rakes, MD [place]

## 2021-08-12 ENCOUNTER — Other Ambulatory Visit: Payer: Self-pay | Admitting: Family Medicine

## 2021-08-12 ENCOUNTER — Encounter: Payer: Medicare Other | Attending: Registered Nurse | Admitting: Physical Medicine & Rehabilitation

## 2021-08-12 ENCOUNTER — Other Ambulatory Visit: Payer: Self-pay

## 2021-08-12 ENCOUNTER — Encounter: Payer: Self-pay | Admitting: Physical Medicine & Rehabilitation

## 2021-08-12 VITALS — BP 141/79 | HR 72 | Temp 98.2°F | Ht 67.0 in | Wt 148.0 lb

## 2021-08-12 DIAGNOSIS — G8111 Spastic hemiplegia affecting right dominant side: Secondary | ICD-10-CM | POA: Insufficient documentation

## 2021-08-12 DIAGNOSIS — I152 Hypertension secondary to endocrine disorders: Secondary | ICD-10-CM

## 2021-08-12 DIAGNOSIS — I6930 Unspecified sequelae of cerebral infarction: Secondary | ICD-10-CM

## 2021-08-12 DIAGNOSIS — E1169 Type 2 diabetes mellitus with other specified complication: Secondary | ICD-10-CM

## 2021-08-12 DIAGNOSIS — E1159 Type 2 diabetes mellitus with other circulatory complications: Secondary | ICD-10-CM

## 2021-08-12 DIAGNOSIS — Z720 Tobacco use: Secondary | ICD-10-CM

## 2021-08-12 NOTE — Progress Notes (Signed)
Subjective:    Patient ID: Casey Petty, male    DOB: 02-15-1961, 61 y.o.   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  He has undergone inpatient rehab from 03/04/2019 to 03/20/2019.  He has completed outpatient therapy.  He has had persistent upper and lower limb spasticity affecting the right side.  His last botulinum toxin injection was done on 06/08/2021.  He had very good relief in the right upper extremity however lower extremity he notes that he has shaking and abnormal bending of the knee either in bed or while reclined in the evening.  He denies any pain with this.  Pain Inventory Average Pain 0 Pain Right Now 0 My pain is intermittent and spasms and tremors  LOCATION OF PAIN  Right leg   BOWEL Number of stools per week: 7   BLADDER Normal   Mobility walk with assistance use a cane how many minutes can you walk? unknown ability to climb steps?  yes do you drive?  no Do you have any goals in this area?  yes  Function disabled: date disabled 2020 Do you have any goals in this area?  yes  Neuro/Psych weakness numbness tremor tingling trouble walking spasms  Prior Studies Any changes since last visit?  no  Physicians involved in your care Any changes since last visit?  no   Family History  Problem Relation Age of Onset   Heart attack Father    Cancer Neg Hx    Social History   Socioeconomic History   Marital status: Single    Spouse name: Not on file   Number of children: Not on file   Years of education: Not on file   Highest education level: Not on file  Occupational History   Not on file  Tobacco Use   Smoking status: Every Day   Smokeless tobacco: Never   Tobacco comments:    Smoking 4 cigs/day  Vaping Use   Vaping Use: Never used  Substance and Sexual Activity   Alcohol use: Yes    Alcohol/week: 2.0 standard drinks    Types: 2  Standard drinks or equivalent per week   Drug use: No   Sexual activity: Never  Other Topics Concern   Not on file  Social History Narrative   Not on file   Social Determinants of Health   Financial Resource Strain: Not on file  Food Insecurity: Food Insecurity Present   Worried About [place] in the Last Year: Sometimes true   Ran Out of Food in the Last Year: Sometimes true  Transportation Needs: No Transportation Needs   Lack of Transportation (Medical): No   Lack of Transportation (Non-Medical): No  Physical Activity: Not on file  Stress: Not on file  Social Connections: Moderately Isolated   Frequency of Communication with Friends and Family: More than three times a week   Frequency of Social Gatherings with Friends and Family: More than three times a week   Attends Religious Services: Never   Marine scientist or Organizations: No   Attends Archivist Meetings: Never   Marital Status: Living with partner   Past Surgical History:  Procedure Laterality Date   WISDOM TOOTH EXTRACTION Bilateral 06/2021   Past Medical History:  Diagnosis Date   Hypertension    BP (!) 141/79    Pulse 72    Temp 98.2 F (36.8 C)  Ht 5\' 7"  (1.702 m)    Wt 148 lb (67.1 kg)    SpO2 97%    BMI 23.18 kg/m   Opioid Risk Score:   Fall Risk Score:  `1  Depression screen PHQ 2/9  Depression screen [facility] 2/9 06/08/2021 04/27/2021 11/24/2020 11/02/2020 09/24/2020 09/01/2020 07/16/2020  Decreased Interest 0 0 0 0 0 1 0  Down, Depressed, Hopeless 0 0 0 0 0 0 0  PHQ - 2 Score 0 0 0 0 0 1 0  Altered sleeping - - - - - 0 -  Tired, decreased energy - - - - - 0 -  Change in appetite - - - - - 0 -  Feeling bad or failure about yourself  - - - - - 0 -  Trouble concentrating - - - - - 0 -  Moving slowly or fidgety/restless - - - - - 0 -  Suicidal thoughts - - - - - 0 -  PHQ-9 Score - - - - - 1 -    Review of Systems  Musculoskeletal:  Positive for gait problem.  Neurological:   Positive for tremors, weakness and numbness.       Spasms  All other systems reviewed and are negative.     Objective:   Physical Exam Vitals and nursing note reviewed.  Constitutional:      Appearance: He is normal weight.  HENT:     Head: Normocephalic and atraumatic.  Eyes:     Extraocular Movements: Extraocular movements intact.     Conjunctiva/sclera: Conjunctivae normal.     Pupils: Pupils are equal, round, and reactive to light.  Neurological:     Mental Status: He is alert and oriented to person, place, and time.     Cranial Nerves: No dysarthria.     Motor: Weakness present.     Gait: Gait abnormal.   Tone MAS 2 at the elbow flexor MAS 3 at the wrist flexor MAS 2 at the finger flexors MAS 3 at the knee flexors MAS 0 at the thumb flexor. Motor strength trace shoulder abduction on the right side otherwise 0 in the upper extremity Lower extremity has 3 - hip knee extensor synergy.  Patient wears AFO for ambulation       Assessment & Plan:   1.  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  It has been helpful per tickly for upper extremity however lower extremity is relatively underdosed.  I discussed with the patient and his wife that he is at a maximum dose of the botulinum toxin 1500 units of [place].  We will need to reallocate dose and reduce upper extremity dosing will not reinject pectoralis and we can reduce dose at the biceps                                                                           Biceps:            100  Brachioradialis 100                                     FCR                 200                                     FCU                 200                                     FDS                 200                                     FDP                 100                                       Right    Soleus 100 FDL  100U Hamstrings 400U

## 2021-08-31 ENCOUNTER — Ambulatory Visit (INDEPENDENT_AMBULATORY_CARE_PROVIDER_SITE_OTHER): Payer: Medicare Other | Admitting: Adult Health

## 2021-08-31 ENCOUNTER — Encounter: Payer: Self-pay | Admitting: Adult Health

## 2021-08-31 VITALS — BP 114/66 | HR 70 | Ht 67.0 in | Wt 156.0 lb

## 2021-08-31 DIAGNOSIS — G8111 Spastic hemiplegia affecting right dominant side: Secondary | ICD-10-CM

## 2021-08-31 DIAGNOSIS — E785 Hyperlipidemia, unspecified: Secondary | ICD-10-CM

## 2021-08-31 DIAGNOSIS — I6381 Other cerebral infarction due to occlusion or stenosis of small artery: Secondary | ICD-10-CM | POA: Diagnosis not present

## 2021-08-31 NOTE — Patient Instructions (Signed)
Continue to follow with Dr. Letta Pate for spasticity and botox  We will repeat cholesterol levels today   Continue aspirin 81 mg daily  and Crestor and Zetia  for secondary stroke prevention  Continue to follow up with PCP regarding cholesterol, blood pressure and diabetes management  Maintain strict control of hypertension with blood pressure goal below 130/90, diabetes with hemoglobin A1c goal below 7.0 % and cholesterol with LDL cholesterol (bad cholesterol) goal below 70 mg/dL.   Signs of a Stroke? Follow the BEFAST method:  Balance Watch for a sudden loss of balance, trouble with coordination or vertigo Eyes Is there a sudden loss of vision in one or both eyes? Or double vision?  Face: Ask the person to smile. Does one side of the face droop or is it numb?  Arms: Ask the person to raise both arms. Does one arm drift downward? Is there weakness or numbness of a leg? Speech: Ask the person to repeat a simple phrase. Does the speech sound slurred/strange? Is the person confused ? Time: If you observe any of these signs, call 911.        Thank you for coming to see [place] at [facility]. I hope we have been able to provide you high quality care today.  You may receive a patient satisfaction survey over the next few weeks. We would appreciate your feedback and comments so that we may continue to improve ourselves and the health of our patients.

## 2021-08-31 NOTE — Progress Notes (Signed)
[facility] [address] [place]. [place] [address] [phone]       OFFICE FOLLOW UP NOTE  [name] Date of Birth:  August 24, 1960 Medical Record Number:  [id number]   Reason for Referral: stroke follow up    CHIEF COMPLAINT:  Chief Complaint  Patient presents with   Follow-up    Rm 3 with spouse chandra Pt is well and stable, no new concerns      HPI:  Update 08/31/2021 JM: Returns for 39-month stroke follow-up accompanied by his wife.  Overall stable with residual right spastic hemiparesis and denies any new or worsening stroke/TIA symptoms.  Follows with Dr. Letta Pate receiving Botox with last injection 06/08/2021 with improvement.  Scheduled for repeat injection 2/16.  Continued use of cane (outdoors) and AFO brace -denies recent falls.  Compliant on aspirin without side effects.  Prior LDL 90 in 02/2021 therefore switched atorvastatin to Crestor and continued Zetia.  Lipid panel has not been repeated.  Recent A1c by PCP 7.8 currently on Lantus and farxiga.  Blood pressure today 114/66.  No new concerns at this time.    History provided for reference purposes only Update 02/23/2021 JM: Casey Petty returns for stroke follow-up after prior visit approximately 7 months ago.  He is accompanied by his fiance.  Stable since prior visit without new stroke/TIA symptoms.  Residual right spastic hemiparesis and dysarthria -reports improvement since prior visit.  He continues to follow with Dr. Posey Pronto PMR for spasticity management including Botox and baclofen. Improvement of shoulder pain after injection. He completed therapies in 12/2020 and reports continuously doing exercises at home. Continues to ambulate with a cane and AFO brace - no recent falls.  Reports compliance on aspirin, atorvastatin and Zetia without associated side effects.  Blood pressure today 119/74.  He is not currently monitoring BG at home - plans on obtaining Freestyle to monitor.  Prior A1c 14.6  (07/2020) up from 12.2. no new concerns at this time.   Update 07/13/2020 JM Casey Petty returns for 69-month stroke follow-up.  Residual right spastic hemiparesis, R adhesive capsulitis and dysarthria stable.  Continue Botox by Dr. Posey Pronto with plans on repeating next month as well as ongoing use of baclofen. Scheduled shoulder injection with Dr. Posey Pronto for adhesive capsulitis 12/9. Denies new stroke/TIA symptoms. Questionable compliance with prescribed medications as pt and wife both look confused when asked about taking specific medications such as baclofen, atorvastatin and Zetia but then will eventually say he is taking. Reports use of aspirin 81 mg daily and atorvastatin 80 mg daily for secondary stroke prevention.  Prior lipid panel 01/2020 showed LDL 149 therefore Zetia added in addition to atorvastatin.  Prior A1c 01/2020 12.2 (up from 8.4 6 mo prior) despite reported compliance of Jardiance. Patient reports monitoring BG twice daily typically 125 (unsure accuracy). He has not had recent follow-up with PCP.  Blood pressure today 126/74. Monitors at home and typically stable. No concerns at this time.  Update 01/23/2020 JM: Casey Petty is being seen for stroke follow-up accompanied by his fiance.  Residual deficits of right spastic hemiparesis and dysarthria.  Continues to follow with PMR Dr. Posey Pronto for Botox injections and ongoing use of baclofen as well as management of right shoulder adhesive capsulitis. He does report benefit with use of botox. Completed approved Medicaid visits with PT and OT with great benefit and is ambulating with a cane and w/c for long distance. Also continues to use AFO brace. Continues on aspirin and atorvastatin for  secondary stroke prevention. He has not had recent lab work by PCP. Blood pressure today 140/79.  No concerns at this time.  Update 07/25/2019: Casey Petty is a 61 year old male who is being seen today for stroke follow-up.  Residual deficits of right spastic  hemiparesis, mild right facial droop and dysarthria.  He does endorse ongoing improvement.  Continues to follow with physical medicine rehab Dr. Posey Pronto with plans on receiving Botox for ongoing spasticity.  He also had baclofen dosage increased to 20 mg 3 times daily.  He has not received any therapies as previously Medicaid pending.  He primarily uses wheelchair for transfers but has been walking very short distances with assistance of his wife.  Previously using AFO brace but not currently in place with patient stating "I want to try something different".  Continues on aspirin and atorvastatin for secondary stroke prevention without side effects.  Prior lipid panel on 05/08/2019 showed LDL 57.  Blood pressure today 140/90.  Blood work obtained yesterday by PCP Dr. Chapman Fitch with A1c 8.4.  Continues to follow with PCP actively for ongoing management.  Denies new or worsening stroke/TIA symptoms.  Initial visit 05/01/2019: Casey Petty is being seen today for hospital follow-up accompanied by his fiance.  Residual deficits of right hemiparesis and mild dysarthria with mild right facial droop.  He is not currently receiving any therapies as he is Medicaid pending.  He does continue to do exercises at home with ongoing improvement.  He does ambulate without assistive device but does use wheelchair for long distance.  Currently has AFO brace in place for right ankle weakness.  Completed 3 weeks DAPT and continues on aspirin alone without bleeding or bruising.  Continues on atorvastatin 80 mg daily without myalgias.  Blood pressure today 125/71.  Glucose levels have been stable with ongoing use of metformin.  Continues to follow with PCP for HTN, HLD and DM management.  Denies new or worsening stroke/TIA symptoms.  Stroke admission 02/28/2019: Casey Petty is a 61 y.o. male with history of hypertension  who presented to [facility] ED on 02/28/2019 with right sided weakness and gait difficulties. He did not receive IV t-PA due  to late presentation (>4.5 hours from time of onset).  Stroke work-up revealed left BG small infarct as evidenced on MRI likely secondary to small vessel disease source with resultant dysarthria and right hemiparesis.  TCD and carotid Doppler unremarkable.  2D echo normal EF.  Recommended DAPT for 3 weeks and aspirin alone. Hx of HTN with long-term BP goal normotensive range and discharged on home dose amlodipine and initiated lisinopril.  Initiated atorvastatin 80 mg daily for HLD management.  Current tobacco use with smoking cessation counseling provided.  Uncontrolled DM with A1c 7.9.  Other stroke risk factors include EtOH use but no prior history of stroke.  He was discharged to [facility] for ongoing therapy and discharged home on 03/20/2019.        ROS:   14 system review of systems performed and negative with exception of those listed in HPI  PMH:  Past Medical History:  Diagnosis Date   Hypertension     PSH:  Past Surgical History:  Procedure Laterality Date   WISDOM TOOTH EXTRACTION Bilateral 06/2021    Social History:  Social History   Socioeconomic History   Marital status: Single    Spouse name: Not on file   Number of children: Not on file   Years of education: Not on file   Highest education level:  Not on file  Occupational History   Not on file  Tobacco Use   Smoking status: Every Day   Smokeless tobacco: Never   Tobacco comments:    Smoking 4 cigs/day  Vaping Use   Vaping Use: Never used  Substance and Sexual Activity   Alcohol use: Yes    Alcohol/week: 2.0 standard drinks    Types: 2 Standard drinks or equivalent per week   Drug use: No   Sexual activity: Never  Other Topics Concern   Not on file  Social History Narrative   Not on file   Social Determinants of Health   Financial Resource Strain: Not on file  Food Insecurity: Food Insecurity Present   Worried About [place] in the Last Year: Sometimes true   Ran Out of Food in the Last  Year: Sometimes true  Transportation Needs: No Transportation Needs   Lack of Transportation (Medical): No   Lack of Transportation (Non-Medical): No  Physical Activity: Not on file  Stress: Not on file  Social Connections: Moderately Isolated   Frequency of Communication with Friends and Family: More than three times a week   Frequency of Social Gatherings with Friends and Family: More than three times a week   Attends Religious Services: Never   Marine scientist or Organizations: No   Attends Music therapist: Never   Marital Status: Living with partner  Intimate Partner Violence: Not on file    Family History:  Family History  Problem Relation Age of Onset   Heart attack Father    Cancer Neg Hx     Medications:   Current Outpatient Medications on File Prior to Visit  Medication Sig Dispense Refill   acetaminophen (TYLENOL) 325 MG tablet Take 1-2 tablets (325-650 mg total) by mouth every 4 (four) hours as needed for mild pain.     acetaminophen-codeine (TYLENOL #3) 300-30 MG tablet Take 1 tablet by mouth every 6 hours (only as needed) for dental pain. 10 tablet 0   amLODipine (NORVASC) 10 MG tablet TAKE ONE TABLET BY MOUTH EVERY MORNING FOR BLOOD PRESSURE 30 tablet 2   amoxicillin (AMOXIL) 500 MG capsule Take 1 capsule by mouth every 8 hours until finished 21 capsule 0   aspirin 81 MG EC tablet Take 1 tablet (81 mg total) by mouth daily. 100 tablet 0   atorvastatin (LIPITOR) 80 MG tablet SMARTSIG:1 Tablet(s) By Mouth Every Evening     baclofen (LIORESAL) 10 MG tablet Take 30 mg by mouth 3 (three) times daily.     baclofen (LIORESAL) 20 MG tablet TAKE 1.5 TABLETS (30 MG TOTAL) BY MOUTH 3 (THREE) TIMES DAILY. 90 tablet 3   Blood Glucose Monitoring Suppl (ACCU-CHEK GUIDE) w/Device KIT 1 each by Does not apply route in the morning, at noon, in the evening, and at bedtime. 1 kit 0   Blood Glucose Monitoring Suppl (TRUE METRIX METER) w/Device KIT Check blood sugar  fasting and before meals and again if pt feels bad (symptoms of hypo). 1 kit 0   buPROPion (WELLBUTRIN XL) 150 MG 24 hr tablet TAKE ONE TABLET BY MOUTH ONCE DAILY 30 tablet 2   carvedilol (COREG) 12.5 MG tablet TAKE ONE TABLET BY MOUTH EVERY MORNING and TAKE ONE TABLET BY MOUTH EVERY EVENING 60 tablet 2   chlorhexidine (PERIDEX) 0.12 % solution Swish for one minute and spit out twice daily for 10 days. 473 mL 0   clotrimazole (LOTRIMIN) 1 % cream Apply to affected  area 2 times daily for the next 7 days. 15 g 0   COMFORT EZ PEN NEEDLES 32G X 4 MM MISC USE TO INJECT insulin 100 each 2   Continuous Blood Gluc Receiver (DEXCOM G6 RECEIVER) DEVI Use to check blood sugar TID. 1 each 0   Continuous Blood Gluc Sensor (DEXCOM G6 SENSOR) MISC Use to check blood sugar TID. 3 each 3   Continuous Blood Gluc Transmit (DEXCOM G6 TRANSMITTER) MISC Use to check blood sugar TID. 1 each 3   dapagliflozin propanediol (FARXIGA) 10 MG TABS tablet TAKE ONE TABLET BY MOUTH EVERY MORNING 30 tablet 2   diclofenac Sodium (VOLTAREN) 1 % GEL Apply 2 g topically 4 (four) times daily. 350 g 1   ezetimibe (ZETIA) 10 MG tablet TAKE 1 TABLET (10 MG TOTAL) BY MOUTH DAILY. 90 tablet 1   famotidine (PEPCID) 20 MG tablet Take 1 tablet (20 mg total) by mouth daily. 30 tablet 0   glucose blood (ACCU-CHEK GUIDE) test strip Use as instructed up to 4 times daily 100 each 12   glucose blood (TRUE METRIX BLOOD GLUCOSE TEST) test strip Use as instructed before every meal nightly up to 4 times daily 100 each 12   hydrALAZINE (APRESOLINE) 100 MG tablet TAKE 1 TABLET (100 MG TOTAL) BY MOUTH EVERY 8 (EIGHT) HOURS. TO LOWER BLOOD PRESSURE 270 tablet 1   hydrochlorothiazide (HYDRODIURIL) 25 MG tablet Take 1 tablet (25 mg total) by mouth daily. 90 tablet 1   ibuprofen (ADVIL) 800 MG tablet Take 1 tablet every 8 hours as needed for pain 20 tablet 0   insulin glargine (LANTUS) 100 UNIT/ML Solostar Pen INJECT 10 UNITS INTO THE SKIN DAILY. 30 mL 6    Misc. Devices MISC Wheelchair with Accessories: elevating leg rests (ELRs), wheel locks, extensions and anti-tippers. Cane or walker will not suffice 1 each 0   Misc. Devices MISC Blood pressure monitor.  Diagnosis hypertension 1 each 0   rosuvastatin (CRESTOR) 40 MG tablet Take 1 tablet (40 mg total) by mouth daily. 30 tablet 4   tiZANidine (ZANAFLEX) 4 MG tablet Take 1 tablet (4 mg total) by mouth at bedtime. 30 tablet 1   TRUEplus Lancets 28G MISC Check blood sugar before every meal, nightly up to 4 times a day. 100 each 12   No current facility-administered medications on file prior to visit.    Allergies:   Allergies  Allergen Reactions   Lisinopril Swelling    Angioedema- lips/face     Physical Exam  Today's Vitals   08/31/21 0816  BP: 114/66  Pulse: 70  Weight: 156 lb (70.8 kg)  Height: $Remove'5\' 7"'vPACubk$  (1.702 m)   Body mass index is 24.43 kg/m.  General: well developed, well nourished, very pleasant middle-age African-American male, seated, in no evident distress Head: head normocephalic and atraumatic.   Neck: supple with no carotid or supraclavicular bruits Cardiovascular: regular rate and rhythm, no murmurs Musculoskeletal: no deformity Skin:  no rash/petichiae Vascular:  Normal pulses all extremities   Neurologic Exam Mental Status: Awake and fully alert.  Mild dysarthria (although poor denture). Oriented to place and time. Recent and remote memory intact. Attention span, concentration and fund of knowledge mostly appropriate. Mood and affect appropriate.  Cranial Nerves: Pupils equal, briskly reactive to light. Extraocular movements full without nystagmus. Visual fields full to confrontation. Hearing intact. Facial sensation intact.  Very slight right nasolabial fold flattening Motor: RUE: 4/5 with decreased grip strength and spasticity RLE: 3/5 hip flexor and knee flexion,  4/5 hip adduction/abduction and extension; 0/5 ankle dorsiflexion with AFO in place Full strength  left upper and lower extremity Sensory.: intact to touch , pinprick , position and vibratory sensation.  Coordination: Rapid alternating movements normal on left side. Finger-to-nose and heel-to-shin performed accurately on left side. Gait and Station: stands from seated position without difficulty.  Gait abnormality with hemiplegic gait and use of cane and AFO brace. Tandem walk and heel toe not attempted. Reflexes: Brisk right upper and lower extremity ; 1+ left upper and lower extremity. Toes downgoing.      ASSESSMENT: Casey Petty is a 61 y.o. year old male presented with right-sided weakness and gait difficulties on 02/28/2019 with stroke work-up revealing left BG small infarct secondary to small vessel disease. Vascular risk factors include HTN, HLD and tobacco use.      PLAN:  Left BG infarct:  Residual stroke deficits: Right spastic hemiparesis and dysarthria.  Continue to follow with PMR for spasticity receiving Botox. Continue to do HEP. Use of cane and AFO brace for fall prevention Continue aspirin 81 mg daily, Crestor 40 mg daily and Zetia 10 mg daily for secondary stroke prevention.  Discussed secondary stroke prevention measures and importance of close PCP follow-up for aggressive stroke risk factor management HTN: BP goal<130/90.  Stable on current regimen per PCP HLD: LDL goal<70.  LDL 90 (02/2021) up from 46 (07/2018) on atorvastatin and Zetia therefore switched to Crestor 40 mg daily and continuation of Zetia in 02/2021 -repeat lipid panel today. If stable, request ongoing monitoring and refills by PCP DM: A1c goal<7.0.  A1c 7.8 (06/2021) on Lantus and Farxiga managed by PCP    Overall stable with no further recommendations - follow up as needed   CC: Charlott Rakes, MD  I spent 31 minutes of face-to-face and non-face-to-face time with patient and wife.  This included previsit chart review, lab review, study review, order entry, electronic health record  documentation, patient and wife education and discussion regarding history of stroke with residual deficits, secondary stroke prevention measures and agreesive stroke risk factor management and answered all other questions to patient and wife's satisfaction   [name], AGNP-BC  [facility] [address] [place], [place] [address]  Phone [phone] Fax [phone] Note: This document was prepared with digital dictation and possible smart phrase technology. Any transcriptional errors that result from this process are unintentional.

## 2021-09-01 ENCOUNTER — Other Ambulatory Visit: Payer: Self-pay

## 2021-09-01 ENCOUNTER — Telehealth: Payer: Self-pay

## 2021-09-01 ENCOUNTER — Other Ambulatory Visit: Payer: Self-pay | Admitting: *Deleted

## 2021-09-01 LAB — LIPID PANEL
Chol/HDL Ratio: 3.2 ratio (ref 0.0–5.0)
Cholesterol, Total: 158 mg/dL (ref 100–199)
HDL: 50 mg/dL (ref >39)
LDL Chol Calc (NIH): 87 mg/dL (ref 0–99)
Triglycerides: 116 mg/dL (ref 0–149)
VLDL Cholesterol Cal: 21 mg/dL (ref 5–40)

## 2021-09-01 NOTE — Patient Instructions (Signed)
Visit Information  Casey Petty was given information about Medicaid Managed Care team care coordination services and verbally consented to engagement with the [facility] Managed Care team.    Casey Petty,  Thank you for speaking with me today. I am excited for you as you improve your health! Please know that you can reach out to your Primary Care Physician or your insurance provider with new needs for Case Management. I wish you the best!  Please see education materials related to HTN and DM provided as print materials.   The patient verbalized understanding of instructions provided today and agreed to receive a mailed copy of patient instruction and/or educational materials.  No further follow up required:    Casey Montane, RN  Following is a copy of your plan of care:  Patient Care Plan: Diabetes Type 2 (Adult)  Completed 06/30/2021   Problem Identified: Glycemic Management (Diabetes, Type 2) Resolved 06/30/2021  Note:   Resolving due to duplicate goal     Long-Range Goal: Glycemic Management Optimized Completed 06/30/2021  Start Date: 01/21/2021  Expected End Date: 06/30/2021  Recent Progress: On track  Priority: High  Note:   Objective:  Lab Results  Component Value Date   HGBA1C 7.2 (H) 02/24/2021   Lab Results  Component Value Date   CREATININE 1.04 09/01/2020   CREATININE 0.94 07/24/2019   CREATININE 0.93 05/08/2019   No results found for: EGFR Current Barriers:  Knowledge Deficits related to basic Diabetes pathophysiology and self care/management-Casey Petty is managing his health after having a stroke in 2020. He has right side hemiparesis, DMII and HTN. His DM is poorly controlled. Patient is unable to check his blood sugar and does not like to be stuck. He is interested in CGM. Patient administers his medications, but has limited understanding of his medications. He is interested in having his prescriptions delivered to his home. Patient reports his wheelchair  is not in good shape and needs to be replaced or repaired. He lives with his fiance who helps him with managing his care. Patient with recent A1C 7.2 from 14.6. Reports checking BS twice daily with readings between 106-118. He is eating lean meats with fruits and vegetables. He did receive food resources from [place]. Patient received two resources to call and check on new wheelchair. Planning to call today. Casey Petty is using [name] and would like one of his own. He is working on walking with a cane and not using his wheelchair all of the time. He is working with his PCP for [employer].-Update-Casey Petty has not received BP cuff, he continues to use his fiance's, reports last reading good, but unable to remember the reading. He is interested in smoking cessation, currently smokes 5 cig/day. He is getting married Nov 19.  Knowledge Deficits related to medications used for management of diabetes Difficulty obtaining or cannot afford medications Financial Constraints Cognitive Deficits Does not use cbg meter  Unable to independently monitor blood sugar Does not adhere to provider recommendations [id number] blood sugar, maintaining a diabetic diet  Does not adhere to prescribed medication regimen Lacks social connections Does not contact provider office for questions/concerns Resolving due to duplicate goal Case Manager Clinical Goal(s):  patient will demonstrate improved adherence to prescribed treatment plan for diabetes self care/management as evidenced by: adherence to ADA/ carb modified diet, adherence to prescribed medication regimen, contacting provider for new or worsened symptoms or questions Interventions:  Inter-disciplinary care team collaboration (see longitudinal plan of care) Provided  education to patient about smoking cessation Discussed plans with patient for ongoing care management follow up and provided patient with direct contact information for care management  team Review of patient status, including review of consultants reports, relevant laboratory and other test results, and medications completed. Reviewed scheduled appointments: 11/2 with PCP Encouraged patient to continue exercises provided during PT Reviewed medications-discussed calling pharmacy for new prescription, tizanidine Discussed the importance of dental care, provided information for [facility] [phone] Collaborate with PCP for BP cuff Congratulated Casey Petty on his upcoming wedding Self-Care Activities - Self administers oral medications as prescribed Self administers insulin as prescribed Attends all scheduled provider appointments Adheres to prescribed ADA/carb modified Patient Goals: - call [facility] [phone] for dental evaluation - work with Care Guide for food resources - call PCP office and schedule a follow up appointment-scheduled for 06/09/21 - eat three small meals a day, adhering to a diabetic diet - work with [facility] Pharmacist, for medication management - call [facility] for newly prescribed medication-tizanidine, to take at bedtime - check blood sugar at prescribed times - enter blood sugar readings and medication or insulin into daily log - take the blood sugar meter to all doctor visits  Follow Up Plan: Telephone follow up appointment with care management team member scheduled for:06/30/21 @ 9am       Patient Care Plan: [place] of Care  Completed 09/01/2021   Problem Identified: Chronic Disease Management needs related to DMII and HTN Resolved 09/01/2021  Priority: High     Long-Range Goal: Development of Plan of Care for Chronic Disease Management related to DMII and HTN Completed 09/01/2021  Start Date: 06/30/2021  Expected End Date: 09/28/2021  Priority: High  Note:   Current Barriers:  Chronic Disease Management support and education needs related to HTN and DMII Casey Petty will get his dentures today. [name].  He has all of his medications, is aware of upcoming appointments and has transportation to these appointments. He now has Medicare and Medicaid of [place].   RNCM Clinical Goal(s):  Patient will verbalize understanding of plan for management of HTN and DMII as evidenced by patient verbalization and self monitoring activities take all medications exactly as prescribed and will call provider for medication related questions as evidenced by documentation in EMR    attend all scheduled medical appointments: Dr. Letta Pate 08/12/21 @ 1pm, Dr. Darden Dates 08/31/21 @ 8:45am and Dr. Margarita Rana 09/13/21 @ 11:10am  as evidenced by physician notes in EMR        demonstrate improved adherence to prescribed treatment plan for HTN and DMII as evidenced by improved labs and self monitored readings continue to work with RN Care Manager and/or Social Worker to address care management and care coordination needs related to HTN and DMII as evidenced by adherence to CM Team Scheduled appointments     through collaboration with Consulting civil engineer, provider, and care team.   Interventions: Inter-disciplinary care team collaboration (see longitudinal plan of care) Evaluation of current treatment plan related to  self management and patient's adherence to plan as established by provider   Diabetes:  (Status: Goal on Track (progressing): YES.) Long Term Goal   Lab Results  Component Value Date   HGBA1C 7.8 (A) 06/09/2021  Assessed patient's understanding of A1c goal: <7% Reviewed medications with patient and discussed importance of medication adherence;        Reviewed prescribed diet with patient diabetic diet; Reviewed scheduled/upcoming provider appointments including: Dr. Margarita Rana  09/13/21 @ 11:10am;         call provider for findings outside established parameters;       Review of patient status, including review of consultants reports, relevant laboratory and other test results, and medications completed;       Assessed social  determinant of health barriers;         Hypertension: (Status: Goal on Track (progressing): YES.) Last practice recorded BP readings:  BP Readings from Last 3 Encounters:  08/31/21 114/66  08/12/21 (!) 141/79  06/09/21 (!) 156/80  Most recent eGFR/CrCl:  Lab Results  Component Value Date   EGFR 83 06/16/2021    No components found for: CRCL  Reviewed medications with patient and discussed importance of compliance;  Counseled on the importance of exercise goals with target of 150 minutes per week Discussed plans with patient for ongoing care management follow up and provided patient with direct contact information for care management team; Advised patient, providing education and rationale, to monitor blood pressure daily and record, calling PCP for findings outside established parameters;  Provided education on prescribed diet dash;  Discussed complications of poorly controlled blood pressure such as heart disease, stroke, circulatory complications, vision complications, kidney impairment, sexual dysfunction;  Assessed social determinant of health barriers;  Last BP 114/66  Patient Goals/Self-Care Activities: Take medications as prescribed   Attend all scheduled provider appointments Call pharmacy for medication refills 3-7 days in advance of running out of medications Call provider office for new concerns or questions  check blood sugar at prescribed times: once daily take the blood sugar meter to all doctor visits fill half of plate with vegetables keep a food diary check blood pressure daily write blood pressure results in a log or diary take blood pressure log to all doctor appointments call doctor for signs and symptoms of high blood pressure keep all doctor appointments take medications for blood pressure exactly as prescribed eat more whole grains, fruits and vegetables, lean meats and healthy fats

## 2021-09-01 NOTE — Telephone Encounter (Signed)
Contacted pt regarding labs, LVM rq call back

## 2021-09-01 NOTE — Telephone Encounter (Signed)
Pt returned call,  stated he is taking Crestor not atorvastatin. Although pt seemed unsure of which one, I advised him Recent lab work did not show much improvement of cholesterol. Since he is taking Crestor, Janett Billow would recommend f/u with PCP to discuss PCSK-9 inhibitor which is a twice monthly injectable medication to lower cholesterol levels.  Advised to call office back with questions as he had none at this time and was appreciative.

## 2021-09-01 NOTE — Telephone Encounter (Signed)
-----   Message from [name], NP sent at 09/01/2021 12:29 PM EST ----- Recent lab work did not show much improvement of cholesterol - he was unsure if he was taking Crestor or still taking atorvastatin - can this please be verified. If still taking atorvastatin, will need to start Crestor. If taking Crestor and Zetia, would recommend f/u with PCP to discuss PCSK-9 inhibitor which is a twice monthly injectable medication to lower cholesterol levels.

## 2021-09-01 NOTE — Patient Outreach (Signed)
Medicaid Managed Care   Nurse Care Manager Note  09/01/2021 Name:  Casey Petty MRN:  [id number] DOB:  Oct 21, 1960  Casey Petty is an 61 y.o. year old male who is a primary patient of Casey Register, MD.  The [facility] Managed Care Coordination team was consulted for assistance with:    HTN DMII  Casey Petty was given information about Medicaid Managed Care Coordination team services today. Star Age Patient agreed to services and verbal consent obtained.  Engaged with patient by telephone for follow up visit in response to provider referral for case management and/or care coordination services.   Assessments/Interventions:  Review of past medical history, allergies, medications, health status, including review of consultants reports, laboratory and other test data, was performed as part of comprehensive evaluation and provision of chronic care management services.  SDOH (Social Determinants of Health) assessments and interventions performed: SDOH Interventions    Flowsheet Row Most Recent Value  SDOH Interventions   Food Insecurity Interventions Patient Refused  Physical Activity Interventions Intervention Not Indicated       Care Plan  Allergies  Allergen Reactions   Lisinopril Swelling    Angioedema- lips/face    Medications Reviewed Today     Reviewed by Heidi Dach, RN (Registered Nurse) on 09/01/21 at [phone]  Med List Status: <None>   Medication Order Taking? Sig Documenting Provider Last Dose Status Informant  acetaminophen (TYLENOL) 325 MG tablet [id number] No Take 1-2 tablets (325-650 mg total) by mouth every 4 (four) hours as needed for mild pain.  Patient not taking: Reported on 09/01/2021   Jacquelynn Cree, [place] Not Taking Active            Med Note Domenick Bookbinder, [name]   Tue Nov 24, 2020 10:30 AM) Prn only  acetaminophen-codeine (TYLENOL #3) 300-30 MG tablet [id number] No Take 1 tablet by mouth every 6 hours (only as needed) for dental pain.  Patient  not taking: Reported on 09/01/2021    Not Taking Consider Medication Status and Discontinue   amLODipine (NORVASC) 10 MG tablet [id number] Yes TAKE ONE TABLET BY MOUTH EVERY MORNING FOR BLOOD PRESSURE Casey Register, MD Taking Active   amoxicillin (AMOXIL) 500 MG capsule [id number] No Take 1 capsule by mouth every 8 hours until finished  Patient not taking: Reported on 09/01/2021    Not Taking Active            Med Note (Evann Koelzer A   Wed Sep 01, 2021  9:13 AM) completed  aspirin 81 MG EC tablet [id number] Yes Take 1 tablet (81 mg total) by mouth daily. Casey Register, MD Taking Active   baclofen (LIORESAL) 20 MG tablet [id number] Yes TAKE 1.5 TABLETS (30 MG TOTAL) BY MOUTH 3 (THREE) TIMES DAILY. Kirsteins, Victorino Sparrow, MD Taking Active   Blood Glucose Monitoring Suppl (ACCU-CHEK GUIDE) w/Device KIT [id number] Yes 1 each by Does not apply route in the morning, at noon, in the evening, and at bedtime. Casey Register, MD Taking Active   Blood Glucose Monitoring Suppl (TRUE METRIX METER) w/Device KIT [id number] Yes Check blood sugar fasting and before meals and again if pt feels bad (symptoms of hypo). Fulp, Cammie, MD Taking Active   buPROPion (WELLBUTRIN XL) 150 MG 24 hr tablet [id number] Yes TAKE ONE TABLET BY MOUTH ONCE DAILY Casey Register, MD Taking Active   carvedilol (COREG) 12.5 MG tablet [id number] Yes TAKE ONE TABLET BY MOUTH EVERY MORNING and TAKE ONE TABLET BY MOUTH EVERY EVENING Casey Register, MD Taking Active  chlorhexidine (PERIDEX) 0.12 % solution [id number] No Swish for one minute and spit out twice daily for 10 days.  Patient not taking: Reported on 09/01/2021    Not Taking Active            Med Note (Colman Birdwell A   Wed Sep 01, 2021  9:14 AM)    clotrimazole (LOTRIMIN) 1 % cream [id number] No Apply to affected area 2 times daily for the next 7 days.  Patient not taking: Reported on 09/01/2021   [name], PA-C Not Taking Active            Med Note (Konstantinos Cordoba A   Tue Jul 27, 2021  9:24 AM) completed  COMFORT EZ PEN NEEDLES 32G X 4 MM MISC [id number] Yes USE TO INJECT insulin Charlott Rakes, MD Taking Active   Continuous Blood Gluc Receiver (DEXCOM G6 RECEIVER) DEVI [id number] No Use to check blood sugar TID.  Patient not taking: Reported on 09/01/2021   Charlott Rakes, MD Not Taking Active   Continuous Blood Gluc Sensor (DEXCOM G6 SENSOR) MISC [id number] No Use to check blood sugar TID.  Patient not taking: Reported on 09/01/2021   Charlott Rakes, MD Not Taking Active   Continuous Blood Gluc Transmit (DEXCOM G6 TRANSMITTER) MISC [id number] No Use to check blood sugar TID.  Patient not taking: Reported on 09/01/2021   Charlott Rakes, MD Not Taking Active   dapagliflozin propanediol (FARXIGA) 10 MG TABS tablet [id number] Yes TAKE ONE TABLET BY MOUTH EVERY MORNING Charlott Rakes, MD Taking Active   diclofenac Sodium (VOLTAREN) 1 % GEL [id number] Yes Apply 2 g topically 4 (four) times daily. [name], MD Taking Active   ezetimibe (ZETIA) 10 MG tablet [id number] Yes TAKE 1 TABLET (10 MG TOTAL) BY MOUTH DAILY. Charlott Rakes, MD Taking Active            Med Note (Brennan Karam A   Wed Sep 01, 2021  9:16 AM)    famotidine (PEPCID) 20 MG tablet [id number] Yes Take 1 tablet (20 mg total) by mouth daily. Charlott Rakes, MD Taking Active   glucose blood (ACCU-CHEK GUIDE) test strip [id number] Yes Use as instructed up to 4 times daily Charlott Rakes, MD Taking Active   glucose blood (TRUE METRIX BLOOD GLUCOSE TEST) test strip [id number] Yes Use as instructed before every meal nightly up to 4 times daily Charlott Rakes, MD Taking Active   hydrALAZINE (APRESOLINE) 100 MG tablet [id number] Yes TAKE 1 TABLET (100 MG TOTAL) BY MOUTH EVERY 8 (EIGHT) HOURS. TO LOWER BLOOD PRESSURE Charlott Rakes, MD Taking Active   hydrochlorothiazide (HYDRODIURIL) 25 MG tablet [id number] Yes Take 1 tablet (25 mg total) by mouth daily. Charlott Rakes, MD Taking Active   ibuprofen (ADVIL) 800 MG  tablet [id number] Yes Take 1 tablet every 8 hours as needed for pain  Taking Active            Med Note (Petula Rotolo A   Wed Sep 01, 2021  9:16 AM)    insulin glargine (LANTUS) 100 UNIT/ML Solostar Pen [id number] Yes INJECT 10 UNITS INTO THE SKIN DAILY. Charlott Rakes, MD Taking Active   Misc. Devices Centre [id number]  Wheelchair with Accessories: elevating leg rests (ELRs), wheel locks, extensions and anti-tippers. Cane or walker will not suffice Charlott Rakes, MD  Active   Misc. Devices MISC [id number]  Blood pressure monitor.  Diagnosis hypertension Charlott Rakes, MD  Active   rosuvastatin (CRESTOR) 40 MG tablet [id number] Yes Take 1 tablet (40 mg  total) by mouth daily. [name], NP Taking Active   tiZANidine (ZANAFLEX) 4 MG tablet [id number] Yes Take 1 tablet (4 mg total) by mouth at bedtime. Charlett Blake, MD Taking Active            Med Note [name], Adelyne Marchese A   Wed Jun 30, 2021  9:10 AM)    TRUEplus Lancets 28G MISC [id number] Yes Check blood sugar before every meal, nightly up to 4 times a day. 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   Stroke ([place]) 02/28/2019   Essential hypertension 02/28/2019   Tobacco abuse 02/28/2019   Hyperglycemia 02/28/2019    Conditions to be addressed/monitored per PCP order:  HTN and DMII  Care Plan : RN Care Manager Plan of Care  Updates made by [name], RN since 09/01/2021 12:00 AM   Completed 09/01/2021   Problem: Chronic Disease Management needs related to DMII and HTN Resolved 09/01/2021  Priority: High     Long-Range Goal: Development of Plan of Care for Chronic Disease Management related to DMII and HTN Completed 09/01/2021  Start Date: 06/30/2021  Expected End Date: 09/28/2021  Priority: High  Note:   Current Barriers:  Chronic Disease Management support and education needs related to HTN and DMII Mr. Clack will get his dentures today. [name]. He has all of his medications, is aware of upcoming appointments and has transportation to these appointments. He now has Medicare and Medicaid of [place].   RNCM Clinical Goal(s):  Patient will verbalize understanding of plan for management of HTN and DMII as evidenced by patient verbalization and self monitoring activities take all medications exactly as prescribed and will call provider for medication related questions as evidenced by documentation in EMR    attend all scheduled medical appointments: Dr. Letta Pate 08/12/21 @ 1pm, Dr. Darden Dates 08/31/21 @ 8:45am and Dr. Margarita Rana 09/13/21 @ 11:10am  as evidenced by physician notes in EMR        demonstrate improved adherence to prescribed treatment plan for HTN and DMII as evidenced by improved labs and self monitored readings continue to work with RN Care Manager and/or Social Worker to address care management and care coordination needs related to HTN and DMII as evidenced by adherence to CM Team Scheduled appointments     through collaboration with Consulting civil engineer, provider, and care team.   Interventions: Inter-disciplinary care team collaboration (see longitudinal plan of care) Evaluation of current treatment plan related to  self management and patient's adherence to plan as established by provider   Diabetes:  (Status: Goal on Track (progressing): YES.) Long Term Goal   Lab Results  Component Value Date   HGBA1C 7.8 (A) 06/09/2021  Assessed patient's  understanding of A1c goal: <7% Reviewed medications with patient and discussed importance of medication adherence;  Reviewed prescribed diet with patient diabetic diet; Reviewed scheduled/upcoming provider appointments including: Dr. Margarita Rana 09/13/21 @ 11:10am;         call provider for findings outside established parameters;       Review of patient status, including review of consultants reports, relevant laboratory and other test results, and medications completed;       Assessed social determinant of health barriers;         Hypertension: (Status: Goal on Track (progressing): YES.) Last practice recorded BP readings:  BP Readings from Last 3 Encounters:  08/31/21 114/66  08/12/21 (!) 141/79  06/09/21 (!) 156/80  Most recent eGFR/CrCl:  Lab Results  Component Value Date   EGFR 83 06/16/2021    No components found for: CRCL  Reviewed medications with patient and discussed importance of compliance;  Counseled on the importance of exercise goals with target of 150 minutes per week Discussed plans with patient for ongoing care management follow up and provided patient with direct contact information for care management team; Advised patient, providing education and rationale, to monitor blood pressure daily and record, calling PCP for findings outside established parameters;  Provided education on prescribed diet dash;  Discussed complications of poorly controlled blood pressure such as heart disease, stroke, circulatory complications, vision complications, kidney impairment, sexual dysfunction;  Assessed social determinant of health barriers;  Last BP 114/66  Patient Goals/Self-Care Activities: Take medications as prescribed   Attend all scheduled provider appointments Call pharmacy for medication refills 3-7 days in advance of running out of medications Call provider office for new concerns or questions  check blood sugar at prescribed times: once daily take the blood sugar  meter to all doctor visits fill half of plate with vegetables keep a food diary check blood pressure daily write blood pressure results in a log or diary take blood pressure log to all doctor appointments call doctor for signs and symptoms of high blood pressure keep all doctor appointments take medications for blood pressure exactly as prescribed eat more whole grains, fruits and vegetables, lean meats and healthy fats       Follow Up:  Patient agrees to Care Plan and Follow-up.  Plan: RNCM discovered at the end of this visit that Patient is no longer a Managed Medicaid recipient. Patient has been instructed to discuss any needs for Case Management with PCP or insurance provider.  Date/time of next scheduled RN care management/care coordination outreach:  no follow up   Lurena Joiner RN, [place] RN Care Coordinator

## 2021-09-13 ENCOUNTER — Ambulatory Visit: Payer: Medicare Other | Attending: Family Medicine | Admitting: Family Medicine

## 2021-09-13 ENCOUNTER — Encounter: Payer: Self-pay | Admitting: Family Medicine

## 2021-09-13 ENCOUNTER — Other Ambulatory Visit: Payer: Self-pay

## 2021-09-13 VITALS — BP 150/80 | HR 80 | Ht 67.0 in | Wt 157.2 lb

## 2021-09-13 DIAGNOSIS — I6930 Unspecified sequelae of cerebral infarction: Secondary | ICD-10-CM | POA: Diagnosis not present

## 2021-09-13 DIAGNOSIS — I10 Essential (primary) hypertension: Secondary | ICD-10-CM | POA: Diagnosis not present

## 2021-09-13 DIAGNOSIS — Z7182 Exercise counseling: Secondary | ICD-10-CM | POA: Diagnosis not present

## 2021-09-13 DIAGNOSIS — G8111 Spastic hemiplegia affecting right dominant side: Secondary | ICD-10-CM | POA: Diagnosis not present

## 2021-09-13 DIAGNOSIS — Z794 Long term (current) use of insulin: Secondary | ICD-10-CM | POA: Diagnosis not present

## 2021-09-13 DIAGNOSIS — E1169 Type 2 diabetes mellitus with other specified complication: Secondary | ICD-10-CM | POA: Diagnosis not present

## 2021-09-13 DIAGNOSIS — E1159 Type 2 diabetes mellitus with other circulatory complications: Secondary | ICD-10-CM | POA: Diagnosis present

## 2021-09-13 DIAGNOSIS — F17210 Nicotine dependence, cigarettes, uncomplicated: Secondary | ICD-10-CM | POA: Diagnosis not present

## 2021-09-13 DIAGNOSIS — G8110 Spastic hemiplegia affecting unspecified side: Secondary | ICD-10-CM

## 2021-09-13 DIAGNOSIS — I152 Hypertension secondary to endocrine disorders: Secondary | ICD-10-CM

## 2021-09-13 DIAGNOSIS — F17200 Nicotine dependence, unspecified, uncomplicated: Secondary | ICD-10-CM | POA: Diagnosis not present

## 2021-09-13 DIAGNOSIS — Z1211 Encounter for screening for malignant neoplasm of colon: Secondary | ICD-10-CM | POA: Diagnosis not present

## 2021-09-13 DIAGNOSIS — Z713 Dietary counseling and surveillance: Secondary | ICD-10-CM | POA: Insufficient documentation

## 2021-09-13 DIAGNOSIS — Z8673 Personal history of transient ischemic attack (TIA), and cerebral infarction without residual deficits: Secondary | ICD-10-CM | POA: Insufficient documentation

## 2021-09-13 DIAGNOSIS — Z720 Tobacco use: Secondary | ICD-10-CM

## 2021-09-13 LAB — POCT GLYCOSYLATED HEMOGLOBIN (HGB A1C): HbA1c, POC (controlled diabetic range): 6.6 % (ref 0.0–7.0)

## 2021-09-13 LAB — GLUCOSE, POCT (MANUAL RESULT ENTRY): POC Glucose: 224 mg/dl — AB (ref 70–99)

## 2021-09-13 MED ORDER — MISC. DEVICES MISC: 0 refills | Status: DC

## 2021-09-13 NOTE — Patient Instructions (Signed)

## 2021-09-13 NOTE — Progress Notes (Signed)
Subjective:  Patient ID: Casey Petty, male    DOB: 1960-08-12  Age: 61 y.o. MRN: [id number]  CC: Diabetes   HPI Casey Petty is a 61 y.o. year old male with a history of type 2 diabetes mellitus (A1c 7.8) stroke (with right spastic hemiparesis s/p Botox injections by rehab), right adhesive capsulitis, dysarthria, Hypertension.    Interval History: He had a visit with Neurology 2 weeks ago and his current management was continued. Visit with rehab medicine was 1 month ago with Dr. Letta Pate; last Botox injection was in 06/2021) noted return in 1 month for another injection.  He informs me he has his right leg check on some occasions.  A1c 6.6 down from 7.8 and he denies presence of hypoglycemia, numbness in extremities, visual concerns. His BP is elevated and he admits to smoking this morning.  I had prescribed him bupropion however he is not ready to quit.  He needs a wheel chair and a handicap. He currently ambulates with a cane which does not suffice. He is accompanied by his wife to today's visit. Past Medical History:  Diagnosis Date   Hypertension     Past Surgical History:  Procedure Laterality Date   WISDOM TOOTH EXTRACTION Bilateral 06/2021    Family History  Problem Relation Age of Onset   Heart attack Father    Cancer Neg Hx     Allergies  Allergen Reactions   Lisinopril Swelling    Angioedema- lips/face    Outpatient Medications Prior to Visit  Medication Sig Dispense Refill   acetaminophen (TYLENOL) 325 MG tablet Take 1-2 tablets (325-650 mg total) by mouth every 4 (four) hours as needed for mild pain.     acetaminophen-codeine (TYLENOL #3) 300-30 MG tablet Take 1 tablet by mouth every 6 hours (only as needed) for dental pain. 10 tablet 0   amLODipine (NORVASC) 10 MG tablet TAKE ONE TABLET BY MOUTH EVERY MORNING FOR BLOOD PRESSURE 30 tablet 2   amoxicillin (AMOXIL) 500 MG capsule Take 1 capsule by mouth every 8 hours until finished 21 capsule 0    aspirin 81 MG EC tablet Take 1 tablet (81 mg total) by mouth daily. 100 tablet 0   baclofen (LIORESAL) 20 MG tablet TAKE 1.5 TABLETS (30 MG TOTAL) BY MOUTH 3 (THREE) TIMES DAILY. 90 tablet 3   Blood Glucose Monitoring Suppl (ACCU-CHEK GUIDE) w/Device KIT 1 each by Does not apply route in the morning, at noon, in the evening, and at bedtime. 1 kit 0   Blood Glucose Monitoring Suppl (TRUE METRIX METER) w/Device KIT Check blood sugar fasting and before meals and again if pt feels bad (symptoms of hypo). 1 kit 0   buPROPion (WELLBUTRIN XL) 150 MG 24 hr tablet TAKE ONE TABLET BY MOUTH ONCE DAILY 30 tablet 2   carvedilol (COREG) 12.5 MG tablet TAKE ONE TABLET BY MOUTH EVERY MORNING and TAKE ONE TABLET BY MOUTH EVERY EVENING 60 tablet 2   chlorhexidine (PERIDEX) 0.12 % solution Swish for one minute and spit out twice daily for 10 days. 473 mL 0   clotrimazole (LOTRIMIN) 1 % cream Apply to affected area 2 times daily for the next 7 days. 15 g 0   COMFORT EZ PEN NEEDLES 32G X 4 MM MISC USE TO INJECT insulin 100 each 2   Continuous Blood Gluc Receiver (DEXCOM G6 RECEIVER) DEVI Use to check blood sugar TID. 1 each 0   Continuous Blood Gluc Sensor (DEXCOM G6 SENSOR) MISC Use to check blood sugar  TID. 3 each 3   Continuous Blood Gluc Transmit (DEXCOM G6 TRANSMITTER) MISC Use to check blood sugar TID. 1 each 3   dapagliflozin propanediol (FARXIGA) 10 MG TABS tablet TAKE ONE TABLET BY MOUTH EVERY MORNING 30 tablet 2   diclofenac Sodium (VOLTAREN) 1 % GEL Apply 2 g topically 4 (four) times daily. 350 g 1   famotidine (PEPCID) 20 MG tablet Take 1 tablet (20 mg total) by mouth daily. 30 tablet 0   glucose blood (ACCU-CHEK GUIDE) test strip Use as instructed up to 4 times daily 100 each 12   glucose blood (TRUE METRIX BLOOD GLUCOSE TEST) test strip Use as instructed before every meal nightly up to 4 times daily 100 each 12   hydrALAZINE (APRESOLINE) 100 MG tablet TAKE 1 TABLET (100 MG TOTAL) BY MOUTH EVERY 8 (EIGHT)  HOURS. TO LOWER BLOOD PRESSURE 270 tablet 1   hydrochlorothiazide (HYDRODIURIL) 25 MG tablet Take 1 tablet (25 mg total) by mouth daily. 90 tablet 1   ibuprofen (ADVIL) 800 MG tablet Take 1 tablet every 8 hours as needed for pain 20 tablet 0   insulin glargine (LANTUS) 100 UNIT/ML Solostar Pen INJECT 10 UNITS INTO THE SKIN DAILY. 30 mL 6   Misc. Devices MISC Blood pressure monitor.  Diagnosis hypertension 1 each 0   rosuvastatin (CRESTOR) 40 MG tablet Take 1 tablet (40 mg total) by mouth daily. 30 tablet 4   tiZANidine (ZANAFLEX) 4 MG tablet Take 1 tablet (4 mg total) by mouth at bedtime. 30 tablet 1   TRUEplus Lancets 28G MISC Check blood sugar before every meal, nightly up to 4 times a day. 100 each 12   Misc. Devices MISC Wheelchair with Accessories: elevating leg rests (ELRs), wheel locks, extensions and anti-tippers. Cane or walker will not suffice 1 each 0   ezetimibe (ZETIA) 10 MG tablet TAKE 1 TABLET (10 MG TOTAL) BY MOUTH DAILY. 90 tablet 1   No facility-administered medications prior to visit.     ROS Review of Systems  Constitutional:  Negative for activity change and appetite change.  HENT:  Negative for sinus pressure and sore throat.   Eyes:  Negative for visual disturbance.  Respiratory:  Negative for cough, chest tightness and shortness of breath.   Cardiovascular:  Negative for chest pain and leg swelling.  Gastrointestinal:  Negative for abdominal distention, abdominal pain, constipation and diarrhea.  Endocrine: Negative.   Genitourinary:  Negative for dysuria.  Musculoskeletal:  Negative for joint swelling and myalgias.  Skin:  Negative for rash.  Allergic/Immunologic: Negative.   Neurological:  Positive for weakness. Negative for light-headedness and numbness.  Psychiatric/Behavioral:  Negative for dysphoric mood and suicidal ideas.    Objective:  BP (!) 150/80    Pulse 80    Ht $R'5\' 7"'pC$  (1.702 m)    Wt 157 lb 3.2 oz (71.3 kg)    SpO2 97%    BMI 24.62 kg/m    BP/Weight 09/13/2021 02/04/4764 11/11/5033  Systolic BP 465 681 275  Diastolic BP 80 66 79  Wt. (Lbs) 157.2 156 148  BMI 24.62 24.43 23.18      Physical Exam Constitutional:      Appearance: He is well-developed.  Cardiovascular:     Rate and Rhythm: Normal rate.     Heart sounds: Normal heart sounds. No murmur heard. Pulmonary:     Effort: Pulmonary effort is normal.     Breath sounds: Normal breath sounds. No wheezing or rales.  Chest:  Chest wall: No tenderness.  Abdominal:     General: Bowel sounds are normal. There is no distension.     Palpations: Abdomen is soft. There is no mass.     Tenderness: There is no abdominal tenderness.  Musculoskeletal:     Right lower leg: No edema.     Left lower leg: No edema.     Comments: Right spastic hemiparesis Right AFO brace in place  Neurological:     Mental Status: He is alert and oriented to person, place, and time.  Psychiatric:        Mood and Affect: Mood normal.    CMP Latest Ref Rng & Units 06/16/2021 09/01/2020 07/24/2019  Glucose 70 - 99 mg/dL 199(H) 389(H) 152(H)  BUN 6 - 24 mg/dL $Remove'10 11 8  'HGjGXRh$ Creatinine 0.76 - 1.27 mg/dL 1.04 1.04 0.94  Sodium 134 - 144 mmol/L 142 139 141  Potassium 3.5 - 5.2 mmol/L 4.2 4.4 3.9  Chloride 96 - 106 mmol/L 104 101 103  CO2 20 - 29 mmol/L $RemoveB'23 23 23  'pdJhDlvV$ Calcium 8.7 - 10.2 mg/dL 9.6 10.3(H) 10.3(H)  Total Protein 6.0 - 8.5 g/dL - 7.2 7.1  Total Bilirubin 0.0 - 1.2 mg/dL - 0.4 0.6  Alkaline Phos 44 - 121 IU/L - 101 105  AST 0 - 40 IU/L - 24 19  ALT 0 - 44 IU/L - 42 29    Lipid Panel     Component Value Date/Time   CHOL 158 08/31/2021 0843   TRIG 116 08/31/2021 0843   HDL 50 08/31/2021 0843   CHOLHDL 3.2 08/31/2021 0843   CHOLHDL 3.5 07/18/2014 0922   VLDL NOT CALC 07/18/2014 0922   LDLCALC 87 08/31/2021 0843    CBC    Component Value Date/Time   WBC 10.2 03/18/2019 1123   RBC 5.00 03/18/2019 1123   HGB 15.2 03/18/2019 1123   HCT 46.2 03/18/2019 1123   PLT 278 03/18/2019  1123   MCV 92.4 03/18/2019 1123   MCH 30.4 03/18/2019 1123   MCHC 32.9 03/18/2019 1123   RDW 12.8 03/18/2019 1123   LYMPHSABS 3.1 03/05/2019 0702   MONOABS 1.6 (H) 03/05/2019 0702   EOSABS 0.0 03/05/2019 0702   BASOSABS 0.0 03/05/2019 0254    Lab Results  Component Value Date   HGBA1C 6.6 09/13/2021    Assessment & Plan:  1. Type 2 diabetes mellitus with other specified complication, with long-term current use of insulin (HCC) Controlled with A1c of 6.6 which has improved from 7.8 previously He has been commended on this Continue insulin and Farxiga Counseled on Diabetic diet, my plate method, 270 minutes of moderate intensity exercise/week Blood sugar logs with fasting goals of 80-120 mg/dl, random of less than 180 and in the event of sugars less than 60 mg/dl or greater than 400 mg/dl encouraged to notify the clinic. Advised on the need for annual eye exams, annual foot exams, Pneumonia vaccine. - POCT glucose (manual entry) - POCT glycosylated hemoglobin (Hb A1C)  2. Spastic hemiparesis (HCC) Status post Botox injections Continue with right AFO brace Followed by rehab  3. History of CVA with residual deficit Risk factor modification including smoking cessation, statin use Fall precautions - Misc. Devices MISC; Wheelchair with Accessories: elevating leg rests (ELRs), wheel locks, extensions and anti-tippers. Cane or walker will not suffice  Dispense: 1 each; Refill: 0  4. Hypertension associated with diabetes ([place]) Uncontrolled Blood pressure at neurology visit was 114/66 Elevated blood pressure due to the fact that he recently  smoked We will make no regimen changes Counseled on blood pressure goal of less than 130/80, low-sodium, DASH diet, medication compliance, 150 minutes of moderate intensity exercise per week. Discussed medication compliance, adverse effects.  5. Tobacco abuse Spent 3 minutes counseling on smoking cessation but he is not ready to quit  6.  Screening for colon cancer - Ambulatory referral to Gastroenterology     Meds ordered this encounter  Medications   Misc. Devices MISC    Sig: Wheelchair with Accessories: elevating leg rests (ELRs), wheel locks, extensions and anti-tippers. Cane or walker will not suffice    Dispense:  1 each    Refill:  0    Follow-up: Return in about 3 months (around 12/11/2021) for Chronic medical conditions.       Charlott Rakes, MD, FAAFP. [facility] [place] [facility], [place]   09/13/2021, 12:22 PM

## 2021-09-22 ENCOUNTER — Ambulatory Visit (INDEPENDENT_AMBULATORY_CARE_PROVIDER_SITE_OTHER): Payer: Medicaid Other | Admitting: Podiatry

## 2021-09-22 ENCOUNTER — Encounter: Payer: Self-pay | Admitting: Podiatry

## 2021-09-22 ENCOUNTER — Other Ambulatory Visit: Payer: Self-pay

## 2021-09-22 DIAGNOSIS — E1165 Type 2 diabetes mellitus with hyperglycemia: Secondary | ICD-10-CM | POA: Diagnosis not present

## 2021-09-22 DIAGNOSIS — M79675 Pain in left toe(s): Secondary | ICD-10-CM

## 2021-09-22 DIAGNOSIS — M79674 Pain in right toe(s): Secondary | ICD-10-CM | POA: Diagnosis not present

## 2021-09-22 DIAGNOSIS — B351 Tinea unguium: Secondary | ICD-10-CM

## 2021-09-23 ENCOUNTER — Other Ambulatory Visit: Payer: Self-pay

## 2021-09-23 ENCOUNTER — Encounter: Payer: Self-pay | Admitting: Physical Medicine & Rehabilitation

## 2021-09-23 ENCOUNTER — Encounter: Payer: Medicare Other | Attending: Registered Nurse | Admitting: Physical Medicine & Rehabilitation

## 2021-09-23 VITALS — BP 126/75 | HR 80 | Temp 98.8°F | Ht 67.0 in | Wt 155.0 lb

## 2021-09-23 DIAGNOSIS — G8111 Spastic hemiplegia affecting right dominant side: Secondary | ICD-10-CM | POA: Insufficient documentation

## 2021-09-23 NOTE — Patient Instructions (Signed)

## 2021-09-23 NOTE — Progress Notes (Signed)
Dysport Injection for spasticity using needle EMG guidance  Dilution: 200 Units/ml Indication: Severe spasticity which interferes with ADL,mobility and/or  hygiene and is unresponsive to medication management and other conservative care Informed consent was obtained after describing risks and benefits of the procedure with the patient. This includes bleeding, bruising, infection, excessive weakness, or medication side effects. A REMS form is on file and signed. Needle:  needle electrode Number of units per muscle RIGHT    Biceps:            100                                     Brachioradialis 100                                     FCR                 200                                     PT                   200                                     FDS                 200                                     FDP                 100                                       Right    Soleus 100 FDL 100U Hamstrings 400U All injections were done after obtaining appropriate EMG activity and after negative drawback for blood. The patient tolerated the procedure well. Post procedure instructions were given. A followup appointment was made.

## 2021-09-27 NOTE — Progress Notes (Signed)
°  Subjective:  Patient ID: Casey Petty, male    DOB: 12/30/1960,  MRN: [id number]  Casey Petty presents to clinic today for preventative diabetic foot care and callus(es) left heel and painful thick toenails that are difficult to trim. Painful toenails interfere with ambulation. Aggravating factors include wearing enclosed shoe gear. Pain is relieved with periodic professional debridement. Painful calluses are aggravated when weightbearing with and without shoegear. Pain is relieved with periodic professional debridement.  Patient states blood glucose was 131 mg/dl today.    Patient got married in November, and his wife if present on today's visit. Wife states she has been filing the callus on the heel of his left foot. They decline paring on today's visit.  New problem(s): None.   PCP is Charlott Rakes, MD , and last visit was September 13, 2021.  Allergies  Allergen Reactions   Lisinopril Swelling    Angioedema- lips/face    Review of Systems: Negative except as noted in the HPI. Objective:   Constitutional Casey Petty is a pleasant 61 y.o. African American male, WD, WN in NAD. AAO x 3.   Vascular Capillary refill time to digits immediate b/l. Palpable DP pulse(s) b/l LE. Diminished PT pulse(s) b/l LE. Pedal hair absent. No pain with calf compression b/l. Lower extremity skin temperature gradient within normal limits. No edema noted left lower extremity. Trace edema noted right lower extremity.  Neurologic Normal speech. Oriented to person, place, and time. Protective sensation intact 5/5 intact bilaterally with 10g monofilament b/l. Vibratory sensation intact b/l.  Dermatologic Pedal integument with normal turgor, texture and tone b/l LE. No open wounds b/l. No interdigital macerations b/l. Toenails 1-5 b/l elongated, thickened, discolored with subungual debris. +Tenderness with dorsal palpation of nailplates. No hyperkeratotic or porokeratotic lesions present.  Orthopedic:  Wearing AFO on right lower extremity. Muscle strength 5/5 to all LE muscle groups of left lower extremity. Muscle strength 3/5 to all LE muscle groups of right lower extremity. Hammertoe deformity noted 2-5 b/l. Utilizes cane for ambulation assistance.   Radiographs: None  Last A1c:  Hemoglobin A1C Latest Ref Rng & Units 09/13/2021 06/09/2021 02/24/2021  HGBA1C 0.0 - 7.0 % 6.6 7.8(A) 7.2(H)  Some recent data might be hidden    Assessment:   1. Pain due to onychomycosis of toenails of both feet   2. Uncontrolled type 2 diabetes mellitus with hyperglycemia ([place])    Plan:  Patient was evaluated and treated and all questions answered. Consent given for treatment as described below: -Patient to continue soft, supportive shoe gear daily. -Mycotic toenails 1-5 bilaterally were debrided in length and girth with sterile nail nippers and dremel without incident. -Patient/POA to call should there be question/concern in the interim.  Return in about 3 months (around 12/20/2021).  Marzetta Board, DPM

## 2021-10-01 ENCOUNTER — Ambulatory Visit: Payer: Medicare Other

## 2021-10-19 ENCOUNTER — Other Ambulatory Visit: Payer: Self-pay

## 2021-11-04 ENCOUNTER — Other Ambulatory Visit: Payer: Self-pay

## 2021-11-04 ENCOUNTER — Other Ambulatory Visit: Payer: Self-pay | Admitting: Family Medicine

## 2021-11-04 DIAGNOSIS — I152 Hypertension secondary to endocrine disorders: Secondary | ICD-10-CM

## 2021-11-04 DIAGNOSIS — Z720 Tobacco use: Secondary | ICD-10-CM

## 2021-11-04 DIAGNOSIS — E1169 Type 2 diabetes mellitus with other specified complication: Secondary | ICD-10-CM

## 2021-11-04 MED ORDER — BACLOFEN 10 MG PO TABS: 30.0000 mg | ORAL_TABLET | Freq: Three times a day (TID) | ORAL | 2 refills | Status: DC

## 2021-11-04 NOTE — Telephone Encounter (Signed)
Refill on Baclofen sent to [facility] ?

## 2021-11-09 ENCOUNTER — Encounter: Payer: Medicare Other | Attending: Registered Nurse | Admitting: Physical Medicine & Rehabilitation

## 2021-11-09 ENCOUNTER — Encounter: Payer: Self-pay | Admitting: Physical Medicine & Rehabilitation

## 2021-11-09 ENCOUNTER — Other Ambulatory Visit: Payer: Self-pay

## 2021-11-09 VITALS — BP 139/76 | HR 68 | Ht 67.0 in | Wt 155.0 lb

## 2021-11-09 DIAGNOSIS — G8111 Spastic hemiplegia affecting right dominant side: Secondary | ICD-10-CM | POA: Diagnosis present

## 2021-11-09 NOTE — Progress Notes (Signed)
? ?Subjective:  ? ? Patient ID: Casey Petty, male    DOB: Dec 26, 1960, 61 y.o.   MRN: [id number] ? ?HPI ?61 year old male with history of left CVA causing right spastic hemiplegia returns today after receiving botulinum toxin injection 6 weeks ago.  Overall he is quite happy with the results.  He does have some residual tightness of his right hand particularly the little finger at the finger flexors.  He is wondering whether he can get a brace for this.  He does have a plastic brace but it is uncomfortable and was hoping to get something padded.  The patient has had no falls or other new problems he remains independent with all self-care and mobility ?Right shoulder more mobile ?Right Leg shaking has dimished  ?RIGHT ?   Biceps:            100 ?                                    Brachioradialis 100 ?                                    FCR                 200 ?                                    PT                   200 ?                                    FDS                 200 ?                                    FDP                 100 ?  ?                                    Right    Soleus 100 ?FDL 100U ?Hamstrings 400U ?Pain Inventory ?Average Pain 0 ?Pain Right Now 0 ?My pain is  no pain ? ?In the last 24 hours, has pain interfered with the following? ?General activity 0 ?Relation with others 0 ?Enjoyment of life 0 ?What TIME of day is your pain at its worst? No pain ?Sleep (in general) Fair ? ?Pain is worse with:  no pain ?Pain improves with:  no pain ?Relief from Meds:  no pain ? ?Family History  ?Problem Relation Age of Onset  ? Heart attack Father   ? Cancer Neg Hx   ? ?Social History  ? ?Socioeconomic History  ? Marital status: Single  ?  Spouse name: Not on file  ? Number of children: Not on file  ? Years of education: Not on file  ? Highest education level: Not on file  ?Occupational History  ? Not on file  ?  Tobacco Use  ? Smoking status: Every Day  ? Smokeless tobacco: Never  ? Tobacco comments:  ?   Smoking 4 cigs/day  ?Vaping Use  ? Vaping Use: Never used  ?Substance and Sexual Activity  ? Alcohol use: Yes  ?  Alcohol/week: 2.0 standard drinks  ?  Types: 2 Standard drinks or equivalent per week  ? Drug use: No  ? Sexual activity: Never  ?Other Topics Concern  ? Not on file  ?Social History Narrative  ? Not on file  ? ?Social Determinants of Health  ? ?Financial Resource Strain: Not on file  ?Food Insecurity: Food Insecurity Present  ? Worried About Charity fundraiser in the Last Year: Sometimes true  ? Ran Out of Food in the Last Year: Sometimes true  ?Transportation Needs: No Transportation Needs  ? Lack of Transportation (Medical): No  ? Lack of Transportation (Non-Medical): No  ?Physical Activity: Insufficiently Active  ? Days of Exercise per Week: 5 days  ? Minutes of Exercise per Session: 20 min  ?Stress: Not on file  ?Social Connections: Moderately Isolated  ? Frequency of Communication with Friends and Family: More than three times a week  ? Frequency of Social Gatherings with Friends and Family: More than three times a week  ? Attends Religious Services: Never  ? Active Member of Clubs or Organizations: No  ? Attends Archivist Meetings: Never  ? Marital Status: Living with partner  ? ?Past Surgical History:  ?Procedure Laterality Date  ? WISDOM TOOTH EXTRACTION Bilateral 06/2021  ? ?Past Surgical History:  ?Procedure Laterality Date  ? WISDOM TOOTH EXTRACTION Bilateral 06/2021  ? ?Past Medical History:  ?Diagnosis Date  ? Hypertension   ? ?BP 139/76   Pulse 68   Ht '5\' 7"'$  (1.702 m)   Wt 155 lb (70.3 kg)   SpO2 98%   BMI 24.28 kg/m?  ? ?Opioid Risk Score:   ?Fall Risk Score:  `1 ? ?Depression screen PHQ 2/9 ? ? ?  09/23/2021  ? 10:00 AM 09/13/2021  ? 11:28 AM 08/12/2021  ? 12:47 PM 06/08/2021  ?  2:38 PM 04/27/2021  ?  9:09 AM 11/24/2020  ? 10:31 AM 11/02/2020  ?  9:30 AM  ?Depression screen PHQ 2/9  ?Decreased Interest 0 0 0 0 0 0 0  ?Down, Depressed, Hopeless 0 0  0 0 0 0  ?PHQ - 2 Score 0 0 0  0 0 0 0  ?Altered sleeping  0       ?Tired, decreased energy  0       ?Change in appetite  0       ?Feeling bad or failure about yourself   0       ?Trouble concentrating  0       ?Moving slowly or fidgety/restless  0       ?Suicidal thoughts  0       ?PHQ-9 Score  0       ?  ? ? ?Review of Systems  ?Neurological:  Positive for tremors.  ?All other systems reviewed and are negative. ? ?   ?Objective:  ? Physical Exam ?Vitals and nursing note reviewed.  ?Constitutional:   ?   Appearance: He is normal weight.  ?HENT:  ?   Head: Normocephalic and atraumatic.  ?Eyes:  ?   Extraocular Movements: Extraocular movements intact.  ?   Conjunctiva/sclera: Conjunctivae normal.  ?   Pupils: Pupils are equal, round, and reactive to light.  ?  Musculoskeletal:  ?   Comments: Right shoulder no pain with active range of motion ?Right fifth digit flexed at PIP and DIP no pain with range of motion no pain with hand or wrist range of motion no wrist swelling.  ?Skin: ?   General: Skin is warm and dry.  ?Neurological:  ?   General: No focal deficit present.  ?   Mental Status: He is alert and oriented to person, place, and time.  ?   Comments: Motor strength is 3 - at the right deltoid 3 - at the biceps and triceps, trace finger flexors and extensors ?Right lower extremity 3 - hip flexor knee extensor 0 at the ankle wears AFO ?Tone MAS 1 at the elbow flexors MAS 2 at the wrist and finger flexors ?MAS 1 at the knee flexors  ?Psychiatric:     ?   Mood and Affect: Mood normal.     ?   Behavior: Behavior normal.  ? ? ? ? ? ?   ?Assessment & Plan:  ?1.  Left CVA with right spastic hemiparesis.  Tone has improved after botulinum toxin injection, Dysport 1500 units we will continue with same dose and muscle group selection. ?2.  Residual right hand tightness at the wrist and finger flexors will order resting hand splint ? ?

## 2021-11-10 ENCOUNTER — Other Ambulatory Visit: Payer: Self-pay | Admitting: Pharmacist

## 2021-11-10 MED ORDER — EMPAGLIFLOZIN 25 MG PO TABS: 25.0000 mg | ORAL_TABLET | Freq: Every day | ORAL | 3 refills | Status: DC

## 2021-11-15 ENCOUNTER — Other Ambulatory Visit: Payer: Self-pay

## 2021-11-15 MED ORDER — BACLOFEN 10 MG PO TABS: 30.0000 mg | ORAL_TABLET | Freq: Three times a day (TID) | ORAL | 2 refills | Status: DC

## 2021-12-07 ENCOUNTER — Encounter: Payer: Self-pay | Admitting: Gastroenterology

## 2021-12-09 ENCOUNTER — Ambulatory Visit (AMBULATORY_SURGERY_CENTER): Payer: Medicare Other

## 2021-12-09 VITALS — Ht 67.0 in | Wt 165.0 lb

## 2021-12-09 DIAGNOSIS — Z1211 Encounter for screening for malignant neoplasm of colon: Secondary | ICD-10-CM

## 2021-12-09 MED ORDER — NA SULFATE-K SULFATE-MG SULF 17.5-3.13-1.6 GM/177ML PO SOLN: 1.0000 | Freq: Once | ORAL | 0 refills | Status: AC

## 2021-12-09 NOTE — Progress Notes (Signed)
No egg or soy allergy known to patient  ?No issues known to pt with past sedation with any surgeries or procedures ?Patient denies ever being told they had issues or difficulty with intubation  ?No FH of Malignant Hyperthermia ?Pt is not on diet pills ?Pt is not on home 02  ?Pt is not on blood thinners  ?Pt denies issues with constipation  ?No A fib or A flutter ?NO PA's for preps discussed with pt in PV today  ?Discussed with pt there will be an out-of-pocket cost for prep and that varies from $0 to 70 + dollars - pt verbalized understanding  ?Pt instructed to use Singlecare.com or GoodRx for a price reduction on prep  ?PV completed over the phone. Pt verified name, DOB, address and insurance during PV today.  ?Pt mailed instruction packet with copy of consent form to read and not return, and instructions.  ?Pt encouraged to call with questions or issues.  ? ?Insurance confirmed with pt at [facility] today  ? ?

## 2021-12-14 ENCOUNTER — Other Ambulatory Visit: Payer: Self-pay

## 2021-12-14 MED ORDER — BACLOFEN 10 MG PO TABS: 30.0000 mg | ORAL_TABLET | Freq: Three times a day (TID) | ORAL | 2 refills | Status: DC

## 2021-12-22 ENCOUNTER — Ambulatory Visit (INDEPENDENT_AMBULATORY_CARE_PROVIDER_SITE_OTHER): Payer: Self-pay | Admitting: Podiatry

## 2021-12-22 DIAGNOSIS — Z91199 Patient's noncompliance with other medical treatment and regimen due to unspecified reason: Secondary | ICD-10-CM

## 2021-12-22 NOTE — Progress Notes (Signed)
   Complete physical exam  Patient: Casey Petty   DOB: 05/28/1997   61 y.o. Male  MRN: [id number]  Subjective:    No chief complaint on file.   Casey Petty is a 61 y.o. male who presents today for a complete physical exam. She reports consuming a {diet types:17450} diet. {types:19826} She generally feels {DESC; WELL/FAIRLY WELL/POORLY:18703}. She reports sleeping {DESC; WELL/FAIRLY WELL/POORLY:18703}. She {does/does not:200015} have additional problems to discuss today.    Most recent fall risk assessment:    02/02/2022   10:42 AM  Fall Risk   Falls in the past year? 0  Number falls in past yr: 0  Injury with Fall? 0  Risk for fall due to : No Fall Risks  Follow up Falls evaluation completed     Most recent depression screenings:    02/02/2022   10:42 AM 12/24/2020   10:46 AM  PHQ 2/9 Scores  PHQ - 2 Score 0 0  PHQ- 9 Score 5     {VISON DENTAL STD PSA (Optional):27386}  {History (Optional):23778}  Patient Care Team: Jessup, Joy, NP as PCP - General (Nurse Practitioner)   Outpatient Medications Prior to Visit  Medication Sig   fluticasone (FLONASE) 50 MCG/ACT nasal spray Place 2 sprays into both nostrils in the morning and at bedtime. After 7 days, reduce to once daily.   norgestimate-ethinyl estradiol (SPRINTEC 28) 0.25-35 MG-MCG tablet Take 1 tablet by mouth daily.   Nystatin POWD Apply liberally to affected area 2 times per day   spironolactone (ALDACTONE) 100 MG tablet Take 1 tablet (100 mg total) by mouth daily.   No facility-administered medications prior to visit.    ROS        Objective:     There were no vitals taken for this visit. {Vitals History (Optional):23777}  Physical Exam   No results found for any visits on 03/10/22. {Show previous labs (optional):23779}    Assessment & Plan:    Routine Health Maintenance and Physical Exam  Immunization History  Administered Date(s) Administered   DTaP 08/11/1999, 10/07/1999,  12/16/1999, 08/31/2000, 03/16/2004   Hepatitis A 01/11/2008, 01/16/2009   Hepatitis B 05/29/1999, 07/06/1999, 12/16/1999   HiB (PRP-OMP) 08/11/1999, 10/07/1999, 12/16/1999, 08/31/2000   IPV 08/11/1999, 10/07/1999, 06/05/2000, 03/16/2004   Influenza,inj,Quad PF,6+ Mos 04/18/2014   Influenza-Unspecified 07/18/2012   MMR 06/05/2001, 03/16/2004   Meningococcal Polysaccharide 01/16/2012   Pneumococcal Conjugate-13 08/31/2000   Pneumococcal-Unspecified 12/16/1999, 02/29/2000   Tdap 01/16/2012   Varicella 06/05/2000, 01/11/2008    Health Maintenance  Topic Date Due   HIV Screening  Never done   Hepatitis C Screening  Never done   INFLUENZA VACCINE  03/08/2022   PAP-Cervical Cytology Screening  03/10/2022 (Originally 05/27/2020)   PAP SMEAR-Modifier  03/10/2022 (Originally 05/27/2020)   TETANUS/TDAP  03/10/2022 (Originally 01/15/2022)   HPV VACCINES  Discontinued   COVID-19 Vaccine  Discontinued    Discussed health benefits of physical activity, and encouraged her to engage in regular exercise appropriate for her age and condition.  Problem List Items Addressed This Visit   None Visit Diagnoses     Annual physical exam    -  Primary   Cervical cancer screening       Need for Tdap vaccination          No follow-ups on file.     Joy Jessup, NP   

## 2021-12-23 ENCOUNTER — Encounter: Payer: Self-pay | Admitting: Physical Medicine & Rehabilitation

## 2021-12-23 ENCOUNTER — Other Ambulatory Visit: Payer: Self-pay

## 2021-12-23 ENCOUNTER — Encounter: Payer: Medicare Other | Attending: Registered Nurse | Admitting: Physical Medicine & Rehabilitation

## 2021-12-23 ENCOUNTER — Telehealth: Payer: Self-pay | Admitting: Family Medicine

## 2021-12-23 VITALS — BP 143/80 | HR 72 | Temp 98.0°F | Ht 67.0 in | Wt 155.0 lb

## 2021-12-23 DIAGNOSIS — G8111 Spastic hemiplegia affecting right dominant side: Secondary | ICD-10-CM | POA: Insufficient documentation

## 2021-12-23 DIAGNOSIS — I6930 Unspecified sequelae of cerebral infarction: Secondary | ICD-10-CM

## 2021-12-23 MED ORDER — MISC. DEVICES MISC: 0 refills | Status: AC

## 2021-12-23 NOTE — Patient Instructions (Signed)

## 2021-12-23 NOTE — Progress Notes (Signed)
Dysport Injection for spasticity using needle EMG guidance  Dilution: 200 Units/ml Indication: Severe spasticity which interferes with ADL,mobility and/or  hygiene and is unresponsive to medication management and other conservative care Informed consent was obtained after describing risks and benefits of the procedure with the patient. This includes bleeding, bruising, infection, excessive weakness, or medication side effects. A REMS form is on file and signed. Needle:  needle electrode Number of units per muscle RIGHT    Biceps:            100                                     Brachioradialis 100                                     FCR                 200                                     PT                   200                                     FDS                 200                                     FDP                 100                                       Right    Soleus 100 FDL 100U Hamstrings 400U All injections were done after obtaining appropriate EMG activity and after negative drawback for blood. The patient tolerated the procedure well. Post procedure instructions were given. A followup appointment was made.

## 2021-12-23 NOTE — Telephone Encounter (Signed)
Order has been reprinted and faxed over to [facility] along with Demographics sheet.

## 2021-12-23 NOTE — Telephone Encounter (Signed)
Copied from [place] [id number]. Topic: General - Other >> Dec 23, 2021 10:38 AM Casey Petty A wrote: Reason for CRM: The patient's spouse has called to follow up on a previously discussed prescription for a wheelchair   Please contact further when possible

## 2021-12-24 NOTE — Telephone Encounter (Signed)
Lurline Idol from [facility] stated she needs the most recent visit notes (can accept up to 90 days) that speak of patient's need for a wheelchair.  Fax- [phone]

## 2021-12-24 NOTE — Telephone Encounter (Signed)
Office notes has been faxed over from visit 09/13/2021

## 2021-12-27 ENCOUNTER — Ambulatory Visit (AMBULATORY_SURGERY_CENTER): Payer: Medicare Other | Admitting: Gastroenterology

## 2021-12-27 ENCOUNTER — Encounter: Payer: Self-pay | Admitting: Gastroenterology

## 2021-12-27 VITALS — BP 170/90 | HR 72 | Temp 97.1°F | Resp 13 | Ht 67.0 in | Wt 155.0 lb

## 2021-12-27 DIAGNOSIS — D125 Benign neoplasm of sigmoid colon: Secondary | ICD-10-CM | POA: Diagnosis not present

## 2021-12-27 DIAGNOSIS — Z1211 Encounter for screening for malignant neoplasm of colon: Secondary | ICD-10-CM | POA: Diagnosis not present

## 2021-12-27 DIAGNOSIS — D123 Benign neoplasm of transverse colon: Secondary | ICD-10-CM | POA: Diagnosis not present

## 2021-12-27 MED ORDER — SODIUM CHLORIDE 0.9 % IV SOLN: 500.0000 mL | Freq: Once | INTRAVENOUS | Status: DC

## 2021-12-27 NOTE — Op Note (Signed)
[place] Patient Name: Casey Petty Procedure Date: 12/27/2021 10:05 AM MRN: [id number] Endoscopist: [place]. Candis Schatz , MD Age: 61 Referring MD:  Date of Birth: 1961-03-07 Gender: Male Account #: [id number] Procedure:                Colonoscopy Indications:              Screening for colorectal malignant neoplasm Medicines:                Monitored Anesthesia Care Procedure:                Pre-Anesthesia Assessment:                           - Prior to the procedure, a History and Physical                            was performed, and patient medications and                            allergies were reviewed. The patient's tolerance of                            previous anesthesia was also reviewed. The risks                            and benefits of the procedure and the sedation                            options and risks were discussed with the patient.                            All questions were answered, and informed consent                            was obtained. Prior Anticoagulants: The patient has                            taken no previous anticoagulant or antiplatelet                            agents except for aspirin. ASA Grade Assessment:                            III - A patient with severe systemic disease. After                            reviewing the risks and benefits, the patient was                            deemed in satisfactory condition to undergo the                            procedure.  After obtaining informed consent, the colonoscope                            was passed under direct vision. Throughout the                            procedure, the patient's blood pressure, pulse, and                            oxygen saturations were monitored continuously. The                            Colonoscope was introduced through the anus and                            advanced to the the cecum, identified by                             appendiceal orifice and ileocecal valve. The                            colonoscopy was performed without difficulty. The                            patient tolerated the procedure well. The quality                            of the bowel preparation was adequate. The                            ileocecal valve, appendiceal orifice, and rectum                            were photographed. The bowel preparation used was                            SUPREP via split dose instruction. Scope In: 10:13:06 AM Scope Out: 10:38:37 AM Scope Withdrawal Time: 0 hours 17 minutes 17 seconds  Total Procedure Duration: 0 hours 25 minutes 31 seconds  Findings:                 The perianal and digital rectal examinations were                            normal. Pertinent negatives include normal                            sphincter tone and no palpable rectal lesions.                           A 4 mm polyp was found in the splenic flexure. The                            polyp was sessile. The polyp was removed with a  cold snare. Resection and retrieval were complete.                            Estimated blood loss was minimal.                           A 4 mm polyp was found in the sigmoid colon. The                            polyp was sessile. The polyp was removed with a                            cold snare. Resection and retrieval were complete.                            Estimated blood loss was minimal.                           Multiple small and large-mouthed diverticula were                            found in the sigmoid colon, descending colon,                            transverse colon and ascending colon.                           The exam was otherwise normal throughout the                            examined colon.                           The retroflexed view of the distal rectum and anal                            verge was normal and  showed no anal or rectal                            abnormalities. Complications:            No immediate complications. Estimated Blood Loss:     Estimated blood loss was minimal. Impression:               - One 4 mm polyp at the splenic flexure, removed                            with a cold snare. Resected and retrieved.                           - One 4 mm polyp in the sigmoid colon, removed with                            a cold snare. Resected and retrieved.                           -  Diverticulosis in the sigmoid colon, in the                            descending colon, in the transverse colon and in                            the ascending colon.                           - The distal rectum and anal verge are normal on                            retroflexion view. Recommendation:           - Patient has a contact number available for                            emergencies. The signs and symptoms of potential                            delayed complications were discussed with the                            patient. Return to normal activities tomorrow.                            Written discharge instructions were provided to the                            patient.                           - Resume previous diet.                           - Continue present medications.                           - Await pathology results.                           - Repeat colonoscopy (date not yet determined) for                            surveillance based on pathology results. Adream Parzych E. Candis Schatz, MD 12/27/2021 10:47:39 AM This report has been signed electronically.

## 2021-12-27 NOTE — Progress Notes (Signed)
Pt's states no medical or surgical changes since previsit or office visit. VS assessed by D.T 

## 2021-12-27 NOTE — Progress Notes (Signed)
Called to room to assist during endoscopic procedure.  Patient ID and intended procedure confirmed with present staff. Received instructions for my participation in the procedure from the performing physician.  

## 2021-12-27 NOTE — Progress Notes (Signed)
A and O x3. Report to RN. Tolerated MAC anesthesia well. 

## 2021-12-27 NOTE — Patient Instructions (Signed)
Handouts given for polyps and diverticulosis.  YOU HAD AN ENDOSCOPIC PROCEDURE TODAY AT [facility]:   Refer to the procedure report that was given to you for any specific questions about what was found during the examination.  If the procedure report does not answer your questions, please call your gastroenterologist to clarify.  If you requested that your care partner not be given the details of your procedure findings, then the procedure report has been included in a sealed envelope for you to review at your convenience later.  YOU SHOULD EXPECT: Some feelings of bloating in the abdomen. Passage of more gas than usual.  Walking can help get rid of the air that was put into your GI tract during the procedure and reduce the bloating. If you had a lower endoscopy (such as a colonoscopy or flexible sigmoidoscopy) you may notice spotting of blood in your stool or on the toilet paper. If you underwent a bowel prep for your procedure, you may not have a normal bowel movement for a few days.  Please Note:  You might notice some irritation and congestion in your nose or some drainage.  This is from the oxygen used during your procedure.  There is no need for concern and it should clear up in a day or so.  SYMPTOMS TO REPORT IMMEDIATELY:  Following lower endoscopy (colonoscopy):  Excessive amounts of blood in the stool  Significant tenderness or worsening of abdominal pains  Swelling of the abdomen that is new, acute  Fever of 100F or higher  For urgent or emergent issues, a gastroenterologist can be reached at any hour by calling [phone]. Do not use MyChart messaging for urgent concerns.    DIET:  We do recommend a small meal at first, but then you may proceed to your regular diet.  Drink plenty of fluids but you should avoid alcoholic beverages for 24 hours.  ACTIVITY:  You should plan to take it easy for the rest of today and you should NOT DRIVE or use heavy machinery  until tomorrow (because of the sedation medicines used during the test).    FOLLOW UP: Our staff will call the number listed on your records TOMORROW to check on you and address any questions or concerns that you may have regarding the information given to you following your procedure. If we do not reach you, we will leave a message.  We will attempt to reach you two times.  During this call, we will ask if you have developed any symptoms of COVID 19. If you develop any symptoms (ie: fever, flu-like symptoms, shortness of breath, cough etc.) before then, please call [phone].  If you test positive for Covid 19 in the 2 weeks post procedure, please call and report this information to [place].    If any biopsies were taken you will be contacted by phone or by letter within the next 1-3 weeks.  Please call us at [phone] if you have not heard about the biopsies in 3 weeks.    SIGNATURES/CONFIDENTIALITY: You and/or your care partner have signed paperwork which will be entered into your electronic medical record.  These signatures attest to the fact that that the information above on your After Visit Summary has been reviewed and is understood.  Full responsibility of the confidentiality of this discharge information lies with you and/or your care-partner.

## 2021-12-27 NOTE — Progress Notes (Signed)
[facility] History and Physical   Primary Care Physician:  Charlott Rakes, MD   Reason for Procedure:   Colon cancer screening  Plan:    Screening colonoscopy     HPI: Casey Petty is a 61 y.o. male undergoing average risk screening colonoscopy.  He has no family history of colon cancer and no chronic GI symptoms. He reports having a colonoscopy in [place] many years ago which he says was normal.   Past Medical History:  Diagnosis Date   Diabetes mellitus without complication ([place])    on meds   Hyperlipidemia    on meds   Hypertension    on meds   Stroke [facility]) 2020   R hemiparesis-abnormal gait    Past Surgical History:  Procedure Laterality Date   WISDOM TOOTH EXTRACTION Bilateral 06/2021    Prior to Admission medications   Medication Sig Start Date End Date Taking? Authorizing Provider  amLODipine (NORVASC) 10 MG tablet TAKE ONE TABLET BY MOUTH EVERY MORNING FOR BLOOD PRESSURE 11/04/21  Yes Charlott Rakes, MD  aspirin 81 MG EC tablet Take 1 tablet (81 mg total) by mouth daily. 02/05/21  Yes Charlott Rakes, MD  baclofen (LIORESAL) 10 MG tablet Take 3 tablets (30 mg total) by mouth 3 (three) times daily. 12/14/21  Yes Kirsteins, Luanna Salk, MD  Blood Glucose Monitoring Suppl (ACCU-CHEK GUIDE) w/Device KIT 1 each by Does not apply route in the morning, at noon, in the evening, and at bedtime. 03/01/21  Yes Charlott Rakes, MD  Blood Glucose Monitoring Suppl (TRUE METRIX METER) w/Device KIT Check blood sugar fasting and before meals and again if pt feels bad (symptoms of hypo). 04/24/19  Yes Fulp, Cammie, MD  buPROPion (WELLBUTRIN XL) 150 MG 24 hr tablet TAKE ONE TABLET BY MOUTH ONCE DAILY 11/04/21  Yes Newlin, Enobong, MD  carvedilol (COREG) 12.5 MG tablet TAKE ONE TABLET BY MOUTH EVERY MORNING and TAKE ONE TABLET BY MOUTH EVERY EVENING 11/04/21  Yes Newlin, Enobong, MD  COMFORT EZ PEN NEEDLES 32G X 4 MM MISC USE TO INJECT insulin 08/03/21  Yes Newlin, Enobong, MD   ezetimibe (ZETIA) 10 MG tablet SMARTSIG:1 Tablet(s) By Mouth Every Evening 12/10/21  Yes [provider]  glucose blood (ACCU-CHEK GUIDE) test strip Use as instructed up to 4 times daily 03/01/21  Yes Newlin, Enobong, MD  glucose blood (TRUE METRIX BLOOD GLUCOSE TEST) test strip Use as instructed before every meal nightly up to 4 times daily 02/24/21  Yes Newlin, Enobong, MD  hydrALAZINE (APRESOLINE) 100 MG tablet TAKE ONE TABLET BY MOUTH THREE TIMES DAILY FOR BLOOD PRESSURE 11/04/21  Yes Newlin, Enobong, MD  insulin glargine (LANTUS) 100 UNIT/ML Solostar Pen INJECT 10 UNITS INTO THE SKIN DAILY. 04/28/21 04/28/22 Yes Charlott Rakes, MD  Misc. Devices MISC Wheelchair with Accessories: elevating leg rests (ELRs), wheel locks, extensions and anti-tippers. Cane or walker will not suffice 12/23/21  Yes Newlin, Enobong, MD  rosuvastatin (CRESTOR) 40 MG tablet Take 1 tablet (40 mg total) by mouth daily. 02/26/21  Yes McCue, Janett Billow, NP  tiZANidine (ZANAFLEX) 4 MG tablet Take 1 tablet (4 mg total) by mouth at bedtime. 04/27/21  Yes Kirsteins, Luanna Salk, MD  TRUEplus Lancets 28G MISC Check blood sugar before every meal, nightly up to 4 times a day. 02/24/21  Yes Charlott Rakes, MD  acetaminophen (TYLENOL) 325 MG tablet Take 1-2 tablets (325-650 mg total) by mouth every 4 (four) hours as needed for mild pain. Patient not taking: Reported on 12/27/2021 03/20/19  Love, Pamela S, PA-C  acetaminophen-codeine (TYLENOL #3) 300-30 MG tablet Take 1 tablet by mouth every 6 hours (only as needed) for dental pain. Patient not taking: Reported on 12/23/2021 07/22/21     Continuous Blood Gluc Receiver (DEXCOM G6 RECEIVER) DEVI Use to check blood sugar TID. Patient not taking: Reported on 12/27/2021 02/02/21   Charlott Rakes, MD  Continuous Blood Gluc Sensor (DEXCOM G6 SENSOR) MISC Use to check blood sugar TID. Patient not taking: Reported on 12/27/2021 02/02/21   Charlott Rakes, MD  Continuous Blood Gluc Transmit (DEXCOM G6  TRANSMITTER) MISC Use to check blood sugar TID. Patient not taking: Reported on 12/23/2021 02/02/21   Charlott Rakes, MD  ibuprofen (ADVIL) 800 MG tablet Take 1 tablet every 8 hours as needed for pain Patient not taking: Reported on 12/27/2021 07/22/21       Current Outpatient Medications  Medication Sig Dispense Refill   amLODipine (NORVASC) 10 MG tablet TAKE ONE TABLET BY MOUTH EVERY MORNING FOR BLOOD PRESSURE 30 tablet 2   aspirin 81 MG EC tablet Take 1 tablet (81 mg total) by mouth daily. 100 tablet 0   baclofen (LIORESAL) 10 MG tablet Take 3 tablets (30 mg total) by mouth 3 (three) times daily. 30 each 2   Blood Glucose Monitoring Suppl (ACCU-CHEK GUIDE) w/Device KIT 1 each by Does not apply route in the morning, at noon, in the evening, and at bedtime. 1 kit 0   Blood Glucose Monitoring Suppl (TRUE METRIX METER) w/Device KIT Check blood sugar fasting and before meals and again if pt feels bad (symptoms of hypo). 1 kit 0   buPROPion (WELLBUTRIN XL) 150 MG 24 hr tablet TAKE ONE TABLET BY MOUTH ONCE DAILY 30 tablet 2   carvedilol (COREG) 12.5 MG tablet TAKE ONE TABLET BY MOUTH EVERY MORNING and TAKE ONE TABLET BY MOUTH EVERY EVENING 60 tablet 2   COMFORT EZ PEN NEEDLES 32G X 4 MM MISC USE TO INJECT insulin 100 each 2   ezetimibe (ZETIA) 10 MG tablet SMARTSIG:1 Tablet(s) By Mouth Every Evening     glucose blood (ACCU-CHEK GUIDE) test strip Use as instructed up to 4 times daily 100 each 12   glucose blood (TRUE METRIX BLOOD GLUCOSE TEST) test strip Use as instructed before every meal nightly up to 4 times daily 100 each 12   hydrALAZINE (APRESOLINE) 100 MG tablet TAKE ONE TABLET BY MOUTH THREE TIMES DAILY FOR BLOOD PRESSURE 90 tablet 1   insulin glargine (LANTUS) 100 UNIT/ML Solostar Pen INJECT 10 UNITS INTO THE SKIN DAILY. 30 mL 6   Misc. Devices MISC Wheelchair with Accessories: elevating leg rests (ELRs), wheel locks, extensions and anti-tippers. Cane or walker will not suffice 1 each 0    rosuvastatin (CRESTOR) 40 MG tablet Take 1 tablet (40 mg total) by mouth daily. 30 tablet 4   tiZANidine (ZANAFLEX) 4 MG tablet Take 1 tablet (4 mg total) by mouth at bedtime. 30 tablet 1   TRUEplus Lancets 28G MISC Check blood sugar before every meal, nightly up to 4 times a day. 100 each 12   acetaminophen (TYLENOL) 325 MG tablet Take 1-2 tablets (325-650 mg total) by mouth every 4 (four) hours as needed for mild pain. (Patient not taking: Reported on 12/27/2021)     acetaminophen-codeine (TYLENOL #3) 300-30 MG tablet Take 1 tablet by mouth every 6 hours (only as needed) for dental pain. (Patient not taking: Reported on 12/23/2021) 10 tablet 0   Continuous Blood Gluc Receiver ([place]) DEVI  Use to check blood sugar TID. (Patient not taking: Reported on 12/27/2021) 1 each 0   Continuous Blood Gluc Sensor (DEXCOM G6 SENSOR) MISC Use to check blood sugar TID. (Patient not taking: Reported on 12/27/2021) 3 each 3   Continuous Blood Gluc Transmit (DEXCOM G6 TRANSMITTER) MISC Use to check blood sugar TID. (Patient not taking: Reported on 12/23/2021) 1 each 3   ibuprofen (ADVIL) 800 MG tablet Take 1 tablet every 8 hours as needed for pain (Patient not taking: Reported on 12/27/2021) 20 tablet 0   Current Facility-Administered Medications  Medication Dose Route Frequency Provider Last Rate Last Admin   0.9 %  sodium chloride infusion  500 mL Intravenous Once Daryel November, MD        Allergies as of 12/27/2021 - Review Complete 12/27/2021  Allergen Reaction Noted   Lisinopril Swelling 03/03/2019    Family History  Problem Relation Age of Onset   Heart attack Father    Cancer Neg Hx    Colon polyps Neg Hx    Colon cancer Neg Hx    Esophageal cancer Neg Hx    Stomach cancer Neg Hx    Rectal cancer Neg Hx     Social History   Socioeconomic History   Marital status: Single    Spouse name: Not on file   Number of children: Not on file   Years of education: Not on file   Highest  education level: Not on file  Occupational History   Not on file  Tobacco Use   Smoking status: Every Day    Packs/day: 0.25    Types: Cigarettes   Smokeless tobacco: Never   Tobacco comments:    Smoking 4 cigs/day  Vaping Use   Vaping Use: Never used  Substance and Sexual Activity   Alcohol use: Not Currently   Drug use: No   Sexual activity: Never  Other Topics Concern   Not on file  Social History Narrative   Not on file   Social Determinants of Health   Financial Resource Strain: Not on file  Food Insecurity: Food Insecurity Present   Worried About [place] in the Last Year: Sometimes true   Ran Out of Food in the Last Year: Sometimes true  Transportation Needs: No Transportation Needs   Lack of Transportation (Medical): No   Lack of Transportation (Non-Medical): No  Physical Activity: Insufficiently Active   Days of Exercise per Week: 5 days   Minutes of Exercise per Session: 20 min  Stress: Not on file  Social Connections: Moderately Isolated   Frequency of Communication with Friends and Family: More than three times a week   Frequency of Social Gatherings with Friends and Family: More than three times a week   Attends Religious Services: Never   Marine scientist or Organizations: No   Attends Archivist Meetings: Never   Marital Status: Living with partner  Intimate Partner Violence: Not on file    Review of Systems:  All other review of systems negative except as mentioned in the HPI.  Physical Exam: Vital signs BP (!) 147/91   Pulse 68   Temp (!) 97.1 F (36.2 C) (Skin)   Ht _0  (1.702 m)   Wt 155 lb (70.3 kg)   SpO2 98%   BMI 24.28 kg/m   General:   Alert,  Well-developed, well-nourished, pleasant and cooperative in NAD Airway:  Mallampati 2 Lungs:  Clear throughout to auscultation.   Heart:  Regular rate and rhythm; no murmurs, clicks, rubs,  or gallops. Abdomen:  Soft, nontender and nondistended. Normal bowel  sounds.   Neuro/Psych:  Normal mood and affect. A and O x 3   Lynnie Koehler E. Candis Schatz, MD [facility]

## 2021-12-28 ENCOUNTER — Telehealth: Payer: Self-pay

## 2021-12-28 NOTE — Telephone Encounter (Signed)
  Follow up Call-     12/27/2021    9:43 AM  Call back number  Post procedure Call Back phone  # [phone]  Permission to leave phone message Yes     Patient questions:  Do you have a fever, pain , or abdominal swelling? No. Pain Score  0 *  Have you tolerated food without any problems? Yes.    Have you been able to return to your normal activities? Yes.    Do you have any questions about your discharge instructions: Diet   No. Medications  No. Follow up visit  No.  Do you have questions or concerns about your Care? No.  Actions: * If pain score is 4 or above: No action needed, pain <4.

## 2021-12-29 ENCOUNTER — Ambulatory Visit: Payer: Medicare Other | Admitting: Family Medicine

## 2021-12-30 ENCOUNTER — Other Ambulatory Visit: Payer: Self-pay | Admitting: Family Medicine

## 2021-12-30 DIAGNOSIS — I152 Hypertension secondary to endocrine disorders: Secondary | ICD-10-CM

## 2022-01-01 ENCOUNTER — Encounter: Payer: Self-pay | Admitting: Gastroenterology

## 2022-01-17 ENCOUNTER — Other Ambulatory Visit: Payer: Self-pay | Admitting: Pharmacist

## 2022-01-17 MED ORDER — EMPAGLIFLOZIN 25 MG PO TABS: 25.0000 mg | ORAL_TABLET | Freq: Every day | ORAL | 2 refills | Status: DC

## 2022-01-19 ENCOUNTER — Telehealth: Payer: Self-pay | Admitting: Family Medicine

## 2022-01-19 ENCOUNTER — Encounter: Payer: Self-pay | Admitting: Family Medicine

## 2022-01-19 ENCOUNTER — Ambulatory Visit: Payer: Medicare Other | Attending: Family Medicine | Admitting: Family Medicine

## 2022-01-19 VITALS — BP 138/72 | HR 69 | Temp 98.0°F | Ht 67.0 in | Wt 152.0 lb

## 2022-01-19 DIAGNOSIS — Z7901 Long term (current) use of anticoagulants: Secondary | ICD-10-CM | POA: Insufficient documentation

## 2022-01-19 DIAGNOSIS — I152 Hypertension secondary to endocrine disorders: Secondary | ICD-10-CM

## 2022-01-19 DIAGNOSIS — Z8673 Personal history of transient ischemic attack (TIA), and cerebral infarction without residual deficits: Secondary | ICD-10-CM | POA: Diagnosis not present

## 2022-01-19 DIAGNOSIS — E1169 Type 2 diabetes mellitus with other specified complication: Secondary | ICD-10-CM | POA: Diagnosis present

## 2022-01-19 DIAGNOSIS — E1159 Type 2 diabetes mellitus with other circulatory complications: Secondary | ICD-10-CM | POA: Diagnosis not present

## 2022-01-19 DIAGNOSIS — Z716 Tobacco abuse counseling: Secondary | ICD-10-CM | POA: Diagnosis not present

## 2022-01-19 DIAGNOSIS — Z794 Long term (current) use of insulin: Secondary | ICD-10-CM | POA: Diagnosis not present

## 2022-01-19 DIAGNOSIS — G8110 Spastic hemiplegia affecting unspecified side: Secondary | ICD-10-CM | POA: Diagnosis not present

## 2022-01-19 DIAGNOSIS — I10 Essential (primary) hypertension: Secondary | ICD-10-CM | POA: Insufficient documentation

## 2022-01-19 DIAGNOSIS — F17210 Nicotine dependence, cigarettes, uncomplicated: Secondary | ICD-10-CM | POA: Insufficient documentation

## 2022-01-19 DIAGNOSIS — Z720 Tobacco use: Secondary | ICD-10-CM

## 2022-01-19 LAB — GLUCOSE, POCT (MANUAL RESULT ENTRY): POC Glucose: 134 mg/dl — AB (ref 70–99)

## 2022-01-19 LAB — POCT GLYCOSYLATED HEMOGLOBIN (HGB A1C): HbA1c, POC (controlled diabetic range): 6.6 % (ref 0.0–7.0)

## 2022-01-19 MED ORDER — DEXCOM G6 RECEIVER DEVI: 0 refills | Status: DC

## 2022-01-19 MED ORDER — BUPROPION HCL ER (XL) 150 MG PO TB24: 150.0000 mg | ORAL_TABLET | Freq: Every day | ORAL | 1 refills | Status: DC

## 2022-01-19 MED ORDER — AMLODIPINE BESYLATE 10 MG PO TABS: ORAL_TABLET | ORAL | 1 refills | Status: DC

## 2022-01-19 MED ORDER — EMPAGLIFLOZIN 25 MG PO TABS: 25.0000 mg | ORAL_TABLET | Freq: Every day | ORAL | 1 refills | Status: DC

## 2022-01-19 MED ORDER — HYDRALAZINE HCL 100 MG PO TABS: ORAL_TABLET | ORAL | 1 refills | Status: DC

## 2022-01-19 MED ORDER — CARVEDILOL 12.5 MG PO TABS: ORAL_TABLET | ORAL | 1 refills | Status: DC

## 2022-01-19 MED ORDER — INSULIN GLARGINE 100 UNIT/ML SOLOSTAR PEN: 10.0000 [IU] | PEN_INJECTOR | Freq: Every day | SUBCUTANEOUS | 6 refills | Status: DC

## 2022-01-19 MED ORDER — ROSUVASTATIN CALCIUM 40 MG PO TABS: 40.0000 mg | ORAL_TABLET | Freq: Every day | ORAL | 1 refills | Status: DC

## 2022-01-19 MED ORDER — DEXCOM G6 SENSOR MISC: 3 refills | Status: DC

## 2022-01-19 MED ORDER — DEXCOM G6 TRANSMITTER MISC: 3 refills | Status: DC

## 2022-01-19 MED ORDER — MISC. DEVICES MISC: 0 refills | Status: AC

## 2022-01-19 NOTE — Telephone Encounter (Signed)
[facility].  Can you please follow-up on this or send to appropriate pharmacy if indicated.  I previously ordered this last year but he is yet to receive the Dexcom.  Thank you

## 2022-01-19 NOTE — Telephone Encounter (Signed)
Thank you :)

## 2022-01-19 NOTE — Telephone Encounter (Signed)
Call placed to [facility]. Spoke to the pharmacist there concerning coverage for CGM. His insurance rejected and must be billed under part B. [facility] is not contracted to do so. Rx transferred by [facility] to [facility] and patient informed.

## 2022-01-19 NOTE — Progress Notes (Signed)
Subjective:  Patient ID: Casey Petty, male    DOB: 1961/04/30  Age: 61 y.o. MRN: [id number]  CC: Diabetes   HPI Casey Petty is a 61 y.o. year old male with a history of type 2 diabetes mellitus (A1c 6.6) stroke (with right spastic hemiparesis s/p Botox injections by rehab), right adhesive capsulitis, dysarthria, Hypertension.  Interval History: His ROM in his RUE has improved. Last Botox injection was last month by Dr. Letta Pate.  He would like a prescription for a rollator walker with seat.  Currently ambulates with a cane.  With regards to his diabetes mellitus, he is adherent with his diabetes regimen. Denies hypoglycemia or neuropathic symptoms He is up to date on eye exams but does not recall exactly when he had it. He is adherent with his statin and his antihypertensive. Denies additional concerns today. Past Medical History:  Diagnosis Date   Diabetes mellitus without complication ([place])    on meds   Hyperlipidemia    on meds   Hypertension    on meds   Stroke [facility]) 2020   R hemiparesis-abnormal gait    Past Surgical History:  Procedure Laterality Date   WISDOM TOOTH EXTRACTION Bilateral 06/2021    Family History  Problem Relation Age of Onset   Heart attack Father    Cancer Neg Hx    Colon polyps Neg Hx    Colon cancer Neg Hx    Esophageal cancer Neg Hx    Stomach cancer Neg Hx    Rectal cancer Neg Hx     Social History   Socioeconomic History   Marital status: Single    Spouse name: Not on file   Number of children: Not on file   Years of education: Not on file   Highest education level: Not on file  Occupational History   Not on file  Tobacco Use   Smoking status: Every Day    Packs/day: 0.25    Types: Cigarettes   Smokeless tobacco: Never   Tobacco comments:    Smoking 4 cigs/day  Vaping Use   Vaping Use: Never used  Substance and Sexual Activity   Alcohol use: Not Currently   Drug use: No   Sexual activity: Never  Other Topics  Concern   Not on file  Social History Narrative   Not on file   Social Determinants of Health   Financial Resource Strain: Not on file  Food Insecurity: Food Insecurity Present (09/01/2021)   Hunger Vital Sign    Worried About Running Out of Food in the Last Year: Sometimes true    Ran Out of Food in the Last Year: Sometimes true  Transportation Needs: No Transportation Needs (07/27/2021)   PRAPARE - Hydrologist (Medical): No    Lack of Transportation (Non-Medical): No  Physical Activity: Insufficiently Active (09/01/2021)   Exercise Vital Sign    Days of Exercise per Week: 5 days    Minutes of Exercise per Session: 20 min  Stress: Not on file  Social Connections: Moderately Isolated (05/31/2021)   Social Connection and Isolation Panel [NHANES]    Frequency of Communication with Friends and Family: More than three times a week    Frequency of Social Gatherings with Friends and Family: More than three times a week    Attends Religious Services: Never    Marine scientist or Organizations: No    Attends Archivist Meetings: Never    Marital Status: Living with partner  Allergies  Allergen Reactions   Lisinopril Swelling    Angioedema- lips/face    Outpatient Medications Prior to Visit  Medication Sig Dispense Refill   aspirin 81 MG EC tablet Take 1 tablet (81 mg total) by mouth daily. 100 tablet 0   baclofen (LIORESAL) 10 MG tablet Take 3 tablets (30 mg total) by mouth 3 (three) times daily. 30 each 2   Blood Glucose Monitoring Suppl (ACCU-CHEK GUIDE) w/Device KIT 1 each by Does not apply route in the morning, at noon, in the evening, and at bedtime. 1 kit 0   Blood Glucose Monitoring Suppl (TRUE METRIX METER) w/Device KIT Check blood sugar fasting and before meals and again if pt feels bad (symptoms of hypo). 1 kit 0   COMFORT EZ PEN NEEDLES 32G X 4 MM MISC USE TO INJECT insulin 100 each 2   ezetimibe (ZETIA) 10 MG tablet  SMARTSIG:1 Tablet(s) By Mouth Every Evening     glucose blood (ACCU-CHEK GUIDE) test strip Use as instructed up to 4 times daily 100 each 12   glucose blood (TRUE METRIX BLOOD GLUCOSE TEST) test strip Use as instructed before every meal nightly up to 4 times daily 100 each 12   Misc. Devices MISC Wheelchair with Accessories: elevating leg rests (ELRs), wheel locks, extensions and anti-tippers. Cane or walker will not suffice 1 each 0   tiZANidine (ZANAFLEX) 4 MG tablet Take 1 tablet (4 mg total) by mouth at bedtime. 30 tablet 1   TRUEplus Lancets 28G MISC Check blood sugar before every meal, nightly up to 4 times a day. 100 each 12   amLODipine (NORVASC) 10 MG tablet TAKE ONE TABLET BY MOUTH EVERY MORNING FOR BLOOD PRESSURE 30 tablet 2   buPROPion (WELLBUTRIN XL) 150 MG 24 hr tablet TAKE ONE TABLET BY MOUTH ONCE DAILY 30 tablet 2   carvedilol (COREG) 12.5 MG tablet TAKE ONE TABLET BY MOUTH EVERY MORNING and TAKE ONE TABLET BY MOUTH EVERY EVENING 60 tablet 2   empagliflozin (JARDIANCE) 25 MG TABS tablet Take 1 tablet (25 mg total) by mouth daily before breakfast. 30 tablet 2   hydrALAZINE (APRESOLINE) 100 MG tablet TAKE ONE TABLET BY MOUTH THREE TIMES DAILY FOR BLOOD PRESSURE 90 tablet 0   insulin glargine (LANTUS) 100 UNIT/ML Solostar Pen INJECT 10 UNITS INTO THE SKIN DAILY. 30 mL 6   rosuvastatin (CRESTOR) 40 MG tablet Take 1 tablet (40 mg total) by mouth daily. 30 tablet 4   acetaminophen (TYLENOL) 325 MG tablet Take 1-2 tablets (325-650 mg total) by mouth every 4 (four) hours as needed for mild pain. (Patient not taking: Reported on 12/27/2021)     acetaminophen-codeine (TYLENOL #3) 300-30 MG tablet Take 1 tablet by mouth every 6 hours (only as needed) for dental pain. (Patient not taking: Reported on 12/23/2021) 10 tablet 0   Continuous Blood Gluc Receiver (DEXCOM G6 RECEIVER) DEVI Use to check blood sugar TID. (Patient not taking: Reported on 12/27/2021) 1 each 0   Continuous Blood Gluc Sensor  (DEXCOM G6 SENSOR) MISC Use to check blood sugar TID. (Patient not taking: Reported on 12/27/2021) 3 each 3   Continuous Blood Gluc Transmit (DEXCOM G6 TRANSMITTER) MISC Use to check blood sugar TID. (Patient not taking: Reported on 12/23/2021) 1 each 3   ibuprofen (ADVIL) 800 MG tablet Take 1 tablet every 8 hours as needed for pain (Patient not taking: Reported on 12/27/2021) 20 tablet 0   No facility-administered medications prior to visit.     ROS Review  of Systems  Constitutional:  Negative for activity change and appetite change.  HENT:  Negative for sinus pressure and sore throat.   Eyes:  Negative for visual disturbance.  Respiratory:  Negative for cough, chest tightness and shortness of breath.   Cardiovascular:  Negative for chest pain and leg swelling.  Gastrointestinal:  Negative for abdominal distention, abdominal pain, constipation and diarrhea.  Endocrine: Negative.   Genitourinary:  Negative for dysuria.  Musculoskeletal:  Positive for gait problem. Negative for joint swelling and myalgias.  Skin:  Negative for rash.  Allergic/Immunologic: Negative.   Neurological:  Positive for weakness. Negative for light-headedness and numbness.  Psychiatric/Behavioral:  Negative for dysphoric mood and suicidal ideas.     Objective:  BP 138/72   Pulse 69   Temp 98 F (36.7 C) (Oral)   Ht $R'5\' 7"'ge$  (1.702 m)   Wt 152 lb (68.9 kg)   SpO2 97%   BMI 23.81 kg/m      01/19/2022    9:20 AM 12/27/2021   11:01 AM 12/27/2021   10:51 AM  BP/Weight  Systolic BP 353 614 431  Diastolic BP 72 90 88  Wt. (Lbs) 152    BMI 23.81 kg/m2        Physical Exam Constitutional:      Appearance: He is well-developed.  Cardiovascular:     Rate and Rhythm: Normal rate.     Heart sounds: Normal heart sounds. No murmur heard. Pulmonary:     Effort: Pulmonary effort is normal.     Breath sounds: Normal breath sounds. No wheezing or rales.  Chest:     Chest wall: No tenderness.  Abdominal:      General: Bowel sounds are normal. There is no distension.     Palpations: Abdomen is soft. There is no mass.     Tenderness: There is no abdominal tenderness.  Musculoskeletal:        General: Normal range of motion.     Right lower leg: No edema.     Left lower leg: No edema.     Comments: Right leg in AFO brace Contracture of right hand Abduction of right upper extremity restricted to 80 degrees  Left upper and lower extremities are normal  Neurological:     Mental Status: He is alert and oriented to person, place, and time.  Psychiatric:        Mood and Affect: Mood normal.        Latest Ref Rng & Units 06/16/2021   10:55 AM 09/01/2020   12:12 PM 07/24/2019   12:10 PM  CMP  Glucose 70 - 99 mg/dL 199  389  152   BUN 6 - 24 mg/dL $Remove'10  11  8   'AtDJqsp$ Creatinine 0.76 - 1.27 mg/dL 1.04  1.04  0.94   Sodium 134 - 144 mmol/L 142  139  141   Potassium 3.5 - 5.2 mmol/L 4.2  4.4  3.9   Chloride 96 - 106 mmol/L 104  101  103   CO2 20 - 29 mmol/L $RemoveB'23  23  23   'evRKRKnV$ Calcium 8.7 - 10.2 mg/dL 9.6  10.3  10.3   Total Protein 6.0 - 8.5 g/dL  7.2  7.1   Total Bilirubin 0.0 - 1.2 mg/dL  0.4  0.6   Alkaline Phos 44 - 121 IU/L  101  105   AST 0 - 40 IU/L  24  19   ALT 0 - 44 IU/L  42  29  Lipid Panel     Component Value Date/Time   CHOL 158 08/31/2021 0843   TRIG 116 08/31/2021 0843   HDL 50 08/31/2021 0843   CHOLHDL 3.2 08/31/2021 0843   CHOLHDL 3.5 07/18/2014 0922   VLDL NOT CALC 07/18/2014 0922   LDLCALC 87 08/31/2021 0843    CBC    Component Value Date/Time   WBC 10.2 03/18/2019 1123   RBC 5.00 03/18/2019 1123   HGB 15.2 03/18/2019 1123   HCT 46.2 03/18/2019 1123   PLT 278 03/18/2019 1123   MCV 92.4 03/18/2019 1123   MCH 30.4 03/18/2019 1123   MCHC 32.9 03/18/2019 1123   RDW 12.8 03/18/2019 1123   LYMPHSABS 3.1 03/05/2019 0702   MONOABS 1.6 (H) 03/05/2019 0702   EOSABS 0.0 03/05/2019 0702   BASOSABS 0.0 03/05/2019 4034    Lab Results  Component Value Date   HGBA1C 6.6  09/13/2021    Assessment & Plan:  1. Type 2 diabetes mellitus with other specified complication, with long-term current use of insulin (HCC) Controlled with A1c of 6.6; goal is less than 7.0 Continue current regimen Counseled on Diabetic diet, my plate method, 742 minutes of moderate intensity exercise/week Blood sugar logs with fasting goals of 80-120 mg/dl, random of less than 180 and in the event of sugars less than 60 mg/dl or greater than 400 mg/dl encouraged to notify the clinic. Advised on the need for annual eye exams, annual foot exams, Pneumonia vaccine. - POCT glucose (manual entry) - POCT glycosylated hemoglobin (Hb A1C) - Continuous Blood Gluc Receiver ([place]) DEVI; Use to check blood sugar TID.  Dispense: 1 each; Refill: 0 - Continuous Blood Gluc Sensor (DEXCOM G6 SENSOR) MISC; Use to check blood sugar TID.  Dispense: 3 each; Refill: 3 - Continuous Blood Gluc Transmit (DEXCOM G6 TRANSMITTER) MISC; Use to check blood sugar TID.  Dispense: 1 each; Refill: 3 - Basic Metabolic Panel - insulin glargine (LANTUS) 100 UNIT/ML Solostar Pen; INJECT 10 UNITS INTO THE SKIN DAILY.  Dispense: 30 mL; Refill: 6  2. Hypertension associated with diabetes ([place]) Controlled Counseled on blood pressure goal of less than 130/80, low-sodium, DASH diet, medication compliance, 150 minutes of moderate intensity exercise per week. Discussed medication compliance, adverse effects. - amLODipine (NORVASC) 10 MG tablet; TAKE ONE TABLET BY MOUTH EVERY MORNING FOR BLOOD PRESSURE  Dispense: 90 tablet; Refill: 1 - hydrALAZINE (APRESOLINE) 100 MG tablet; TAKE ONE TABLET BY MOUTH THREE TIMES DAILY FOR BLOOD PRESSURE  Dispense: 90 tablet; Refill: 1 - carvedilol (COREG) 12.5 MG tablet; TAKE ONE TABLET BY MOUTH EVERY MORNING and TAKE ONE TABLET BY MOUTH EVERY EVENING  Dispense: 180 tablet; Refill: 1  3. Tobacco abuse Spent 3 minutes counseling on smoking cessation and he is still working on quitting -  buPROPion (WELLBUTRIN XL) 150 MG 24 hr tablet; Take 1 tablet (150 mg total) by mouth daily.  Dispense: 90 tablet; Refill: 1  4. Spastic hemiparesis (HCC) Secondary to stroke Secondary stroke prevention Continue statin - Misc. Devices MISC; Rollator walker with seat.  Diagnosis - Stroke  Dispense: 1 each; Refill: 0 - Ambulatory referral to Physical Therapy    Meds ordered this encounter  Medications   Continuous Blood Gluc Receiver ([place]) DEVI    Sig: Use to check blood sugar TID.    Dispense:  1 each    Refill:  0   Continuous Blood Gluc Sensor (DEXCOM G6 SENSOR) MISC    Sig: Use to check blood sugar TID.  Dispense:  3 each    Refill:  3   Continuous Blood Gluc Transmit (DEXCOM G6 TRANSMITTER) MISC    Sig: Use to check blood sugar TID.    Dispense:  1 each    Refill:  3   Misc. Devices MISC    Sig: Rollator walker with seat.  Diagnosis - Stroke    Dispense:  1 each    Refill:  0   amLODipine (NORVASC) 10 MG tablet    Sig: TAKE ONE TABLET BY MOUTH EVERY MORNING FOR BLOOD PRESSURE    Dispense:  90 tablet    Refill:  1   buPROPion (WELLBUTRIN XL) 150 MG 24 hr tablet    Sig: Take 1 tablet (150 mg total) by mouth daily.    Dispense:  90 tablet    Refill:  1    This prescription was filled on 10/15/2021. Any refills authorized will be placed on file.   hydrALAZINE (APRESOLINE) 100 MG tablet    Sig: TAKE ONE TABLET BY MOUTH THREE TIMES DAILY FOR BLOOD PRESSURE    Dispense:  90 tablet    Refill:  1   insulin glargine (LANTUS) 100 UNIT/ML Solostar Pen    Sig: INJECT 10 UNITS INTO THE SKIN DAILY.    Dispense:  30 mL    Refill:  6   carvedilol (COREG) 12.5 MG tablet    Sig: TAKE ONE TABLET BY MOUTH EVERY MORNING and TAKE ONE TABLET BY MOUTH EVERY EVENING    Dispense:  180 tablet    Refill:  1    This prescription was filled on 10/15/2021. Any refills authorized will be placed on file.   empagliflozin (JARDIANCE) 25 MG TABS tablet    Sig: Take 1 tablet (25 mg  total) by mouth daily before breakfast.    Dispense:  90 tablet    Refill:  1   rosuvastatin (CRESTOR) 40 MG tablet    Sig: Take 1 tablet (40 mg total) by mouth daily.    Dispense:  90 tablet    Refill:  1    Follow-up: Return in about 6 months (around 07/21/2022) for Chronic medical conditions.       Charlott Rakes, MD, FAAFP. [facility] [place], [place]   01/19/2022, 9:49 AM

## 2022-01-19 NOTE — Progress Notes (Signed)
Wants to get Rolator walker instead of wheelchair.

## 2022-01-19 NOTE — Patient Instructions (Signed)

## 2022-01-20 LAB — BASIC METABOLIC PANEL
BUN/Creatinine Ratio: 10 (ref 9–20)
BUN: 9 mg/dL (ref 6–24)
CO2: 21 mmol/L (ref 20–29)
Calcium: 10 mg/dL (ref 8.7–10.2)
Chloride: 101 mmol/L (ref 96–106)
Creatinine, Ser: 0.93 mg/dL (ref 0.76–1.27)
Glucose: 113 mg/dL — ABNORMAL HIGH (ref 70–99)
Potassium: 4.3 mmol/L (ref 3.5–5.2)
Sodium: 139 mmol/L (ref 134–144)
eGFR: 95 mL/min/{1.73_m2} (ref >59)

## 2022-01-27 ENCOUNTER — Ambulatory Visit (INDEPENDENT_AMBULATORY_CARE_PROVIDER_SITE_OTHER): Payer: Medicare Other

## 2022-01-27 DIAGNOSIS — Z0000 Encounter for general adult medical examination without abnormal findings: Secondary | ICD-10-CM | POA: Diagnosis not present

## 2022-01-27 NOTE — Progress Notes (Addendum)
Subjective:   Casey Petty is a 61 y.o. male who presents for Medicare Annual/Subsequent preventive examination. I discussed the limitations of evaluation and management by telemedicine and the availability of in person appointments. The patient expressed understanding and agreed to proceed.   Visit performed by audio   Patient location: Home  Provider location: Home  Review of Systems    N/A       Objective:    There were no vitals filed for this visit. There is no height or weight on file to calculate BMI.     01/27/2022    9:09 AM 11/09/2021    9:47 AM 08/19/2020   11:06 AM 11/26/2019    9:43 AM 11/26/2019    8:28 AM 03/04/2019    1:23 PM 02/28/2019    8:20 PM  Advanced Directives  Does Patient Have a Medical Advance Directive? No No No No No No No  Would patient like information on creating a medical advance directive?   No - Patient declined No - Guardian declined No - Guardian declined No - Patient declined No - Patient declined    Current Medications (verified) Outpatient Encounter Medications as of 01/27/2022  Medication Sig   amLODipine (NORVASC) 10 MG tablet TAKE ONE TABLET BY MOUTH EVERY MORNING FOR BLOOD PRESSURE   aspirin 81 MG EC tablet Take 1 tablet (81 mg total) by mouth daily.   baclofen (LIORESAL) 10 MG tablet Take 3 tablets (30 mg total) by mouth 3 (three) times daily.   Blood Glucose Monitoring Suppl (ACCU-CHEK GUIDE) w/Device KIT 1 each by Does not apply route in the morning, at noon, in the evening, and at bedtime.   Blood Glucose Monitoring Suppl (TRUE METRIX METER) w/Device KIT Check blood sugar fasting and before meals and again if pt feels bad (symptoms of hypo).   buPROPion (WELLBUTRIN XL) 150 MG 24 hr tablet Take 1 tablet (150 mg total) by mouth daily.   carvedilol (COREG) 12.5 MG tablet TAKE ONE TABLET BY MOUTH EVERY MORNING and TAKE ONE TABLET BY MOUTH EVERY EVENING   COMFORT EZ PEN NEEDLES 32G X 4 MM MISC USE TO INJECT insulin   Continuous  Blood Gluc Receiver (DEXCOM G6 RECEIVER) DEVI Use to check blood sugar TID.   Continuous Blood Gluc Transmit (DEXCOM G6 TRANSMITTER) MISC Use to check blood sugar TID.   empagliflozin (JARDIANCE) 25 MG TABS tablet Take 1 tablet (25 mg total) by mouth daily before breakfast.   ezetimibe (ZETIA) 10 MG tablet SMARTSIG:1 Tablet(s) By Mouth Every Evening   glucose blood (ACCU-CHEK GUIDE) test strip Use as instructed up to 4 times daily   glucose blood (TRUE METRIX BLOOD GLUCOSE TEST) test strip Use as instructed before every meal nightly up to 4 times daily   hydrALAZINE (APRESOLINE) 100 MG tablet TAKE ONE TABLET BY MOUTH THREE TIMES DAILY FOR BLOOD PRESSURE   insulin glargine (LANTUS) 100 UNIT/ML Solostar Pen INJECT 10 UNITS INTO THE SKIN DAILY.   Misc. Devices MISC Wheelchair with Accessories: elevating leg rests (ELRs), wheel locks, extensions and anti-tippers. Cane or walker will not suffice   Misc. Devices MISC Rollator walker with seat.  Diagnosis - Stroke   rosuvastatin (CRESTOR) 40 MG tablet Take 1 tablet (40 mg total) by mouth daily.   tiZANidine (ZANAFLEX) 4 MG tablet Take 1 tablet (4 mg total) by mouth at bedtime.   TRUEplus Lancets 28G MISC Check blood sugar before every meal, nightly up to 4 times a day.   Continuous Blood Gluc  Sensor (DEXCOM G6 SENSOR) MISC Use to check blood sugar TID.   [DISCONTINUED] acetaminophen (TYLENOL) 325 MG tablet Take 1-2 tablets (325-650 mg total) by mouth every 4 (four) hours as needed for mild pain. (Patient not taking: Reported on 12/27/2021)   No facility-administered encounter medications on file as of 01/27/2022.    Allergies (verified) Lisinopril   History: Past Medical History:  Diagnosis Date   Diabetes mellitus without complication ([place])    on meds   Hyperlipidemia    on meds   Hypertension    on meds   Stroke ([place]) 2020   R hemiparesis-abnormal gait   Past Surgical History:  Procedure Laterality Date   WISDOM TOOTH EXTRACTION  Bilateral 06/2021   Family History  Problem Relation Age of Onset   Heart attack Father    Cancer Neg Hx    Colon polyps Neg Hx    Colon cancer Neg Hx    Esophageal cancer Neg Hx    Stomach cancer Neg Hx    Rectal cancer Neg Hx    Social History   Socioeconomic History   Marital status: Single    Spouse name: Not on file   Number of children: Not on file   Years of education: Not on file   Highest education level: Not on file  Occupational History   Not on file  Tobacco Use   Smoking status: Every Day    Packs/day: 0.25    Types: Cigarettes   Smokeless tobacco: Never   Tobacco comments:    Smoking 4 cigs/day  Vaping Use   Vaping Use: Never used  Substance and Sexual Activity   Alcohol use: Not Currently   Drug use: No   Sexual activity: Never  Other Topics Concern   Not on file  Social History Narrative   Not on file   Social Determinants of Health   Financial Resource Strain: Low Risk  (01/27/2022)   Overall Financial Resource Strain (CARDIA)    Difficulty of Paying Living Expenses: Not very hard  Food Insecurity: No Food Insecurity (01/27/2022)   Hunger Vital Sign    Worried About Running Out of Food in the Last Year: Never true    Ran Out of Food in the Last Year: Never true  Transportation Needs: No Transportation Needs (01/27/2022)   PRAPARE - Transportation    Lack of Transportation (Medical): No    Lack of Transportation (Non-Medical): No  Physical Activity: Sufficiently Active (01/27/2022)   Exercise Vital Sign    Days of Exercise per Week: 3 days    Minutes of Exercise per Session: 60 min  Stress: No Stress Concern Present (01/27/2022)   [place]    Feeling of Stress : Not at all  Social Connections: Moderately Isolated (05/31/2021)   Social Connection and Isolation Panel [NHANES]    Frequency of Communication with Friends and Family: More than three times a week    Frequency of  Social Gatherings with Friends and Family: More than three times a week    Attends Religious Services: Never    Marine scientist or Organizations: No    Attends Music therapist: Never    Marital Status: Living with partner    Tobacco Counseling Ready to quit: Not Answered Counseling given: Not Answered Tobacco comments: Smoking 4 cigs/day   Clinical Intake:  Pre-visit preparation completed: Yes  Pain : No/denies pain     Diabetes: Yes  Diabetic?Yes  Interpreter Needed?: No  Information entered by :: Anson [place] CMA   Activities of Daily Living    01/27/2022    9:08 AM  In your present state of health, do you have any difficulty performing the following activities:  Hearing? 0  Vision? 0  Difficulty concentrating or making decisions? 0  Walking or climbing stairs? 0  Dressing or bathing? 0  Doing errands, shopping? 0  Preparing Food and eating ? N  Using the Toilet? N  In the past six months, have you accidently leaked urine? N  Do you have problems with loss of bowel control? N  Managing your Medications? N  Managing your Finances? N  Housekeeping or managing your Housekeeping? N    Patient Care Team: Charlott Rakes, MD as PCP - General (Family Medicine)  Indicate any recent Medical Services you may have received from other than [facility] providers in the past year (date may be approximate).     Assessment:   This is a routine wellness examination for Casey Petty.  Hearing/Vision screen No results found.  Dietary issues and exercise activities discussed:     Goals Addressed   None    Depression Screen    01/19/2022    9:27 AM 12/23/2021    9:21 AM 11/09/2021    9:47 AM 09/23/2021   10:00 AM 09/13/2021   11:28 AM 08/12/2021   12:47 PM 06/08/2021    2:38 PM  PHQ 2/9 Scores  PHQ - 2 Score 1 0 0 0 0 0 0  PHQ- 9 Score 1    0      Fall Risk    01/27/2022    9:08 AM 01/19/2022    9:20 AM 12/23/2021    9:21 AM 11/09/2021    9:47 AM  09/23/2021   10:00 AM  Fall Risk   Falls in the past year? 0 0 0 0 0  Number falls in past yr: 0 0 0  0  Injury with Fall? 0 0 0    Risk for fall due to : No Fall Risks      Follow up Falls evaluation completed        FALL RISK PREVENTION PERTAINING TO THE HOME:  Any stairs in or around the home? No  If so, are there any without handrails? No  Home free of loose throw rugs in walkways, pet beds, electrical cords, etc? Yes  Adequate lighting in your home to reduce risk of falls? Yes   ASSISTIVE DEVICES UTILIZED TO PREVENT FALLS:  Life alert? No  Use of a cane, walker or w/c? Yes  Grab bars in the bathroom? Yes  Shower chair or bench in shower? Yes  Elevated toilet seat or a handicapped toilet? Yes   TIMED UP AND GO:  Was the test performed? No .  Length of time to ambulate 10 feet: 0 sec.     Cognitive Function:        Immunizations Immunization History  Administered Date(s) Administered   Influenza,inj,Quad PF,6+ Mos 06/27/2014, 05/08/2019, 06/09/2021   Pneumococcal Polysaccharide-23 05/08/2019    TDAP status: Due, Education has been provided regarding the importance of this vaccine. Advised may receive this vaccine at local pharmacy or Health Dept. Aware to provide a copy of the vaccination record if obtained from local pharmacy or Health Dept. Verbalized acceptance and understanding.  Flu Vaccine status: Up to date  Pneumococcal vaccine status: Up to date  Covid-19 vaccine status: Declined, Education has been provided regarding the  importance of this vaccine but patient still declined. Advised may receive this vaccine at local pharmacy or Health Dept.or vaccine clinic. Aware to provide a copy of the vaccination record if obtained from local pharmacy or Health Dept. Verbalized acceptance and understanding.  Qualifies for Shingles Vaccine? Yes   Zostavax completed No   Shingrix Completed?: No.    Education has been provided regarding the importance of this  vaccine. Patient has been advised to call insurance company to determine out of pocket expense if they have not yet received this vaccine. Advised may also receive vaccine at local pharmacy or Health Dept. Verbalized acceptance and understanding.  Screening Tests Health Maintenance  Topic Date Due   COVID-19 Vaccine (1) Never done   OPHTHALMOLOGY EXAM  11/18/2021   Zoster Vaccines- Shingrix (1 of 2) 04/21/2022 (Originally 04/14/1980)   TETANUS/TDAP  09/13/2022 (Originally 04/14/1980)   INFLUENZA VACCINE  03/08/2022   FOOT EXAM  06/14/2022   HEMOGLOBIN A1C  07/21/2022   COLONOSCOPY (Pts 45-76yrs Insurance coverage will need to be confirmed)  12/27/2028   Hepatitis C Screening  Completed   HIV Screening  Completed   HPV VACCINES  Aged Out    Health Maintenance  Health Maintenance Due  Topic Date Due   COVID-19 Vaccine (1) Never done   OPHTHALMOLOGY EXAM  11/18/2021    Colorectal cancer screening: Type of screening: Colonoscopy. Completed 12/27/2021. Repeat every 7 years  Lung Cancer Screening: (Low Dose CT Chest recommended if Age 14-80 years, 30 pack-year currently smoking OR have quit w/in 15years.) does qualify.   Lung Cancer Screening Referral: No  Additional Screening:  Hepatitis C Screening: does qualify; Completed Yes  Vision Screening: Recommended annual ophthalmology exams for early detection of glaucoma and other disorders of the eye. Is the patient up to date with their annual eye exam?  Yes  Who is the provider or what is the name of the office in which the patient attends annual eye exams? Dr. Shanon Rosser If pt is not established with a provider, would they like to be referred to a provider to establish care? No .   Dental Screening: Recommended annual dental exams for proper oral hygiene  Community Resource Referral / Chronic Care Management: CRR required this visit?  No   CCM required this visit?  No      Plan:     I have personally reviewed and noted the  following in the patient's chart:   Medical and social history Use of alcohol, tobacco or illicit drugs  Current medications and supplements including opioid prescriptions. Patient is not currently taking opioid prescriptions. Functional ability and status Nutritional status Physical activity Advanced directives List of other physicians Hospitalizations, surgeries, and ER visits in previous 12 months Vitals Screenings to include cognitive, depression, and falls Referrals and appointments  In addition, I have reviewed and discussed with patient certain preventive protocols, quality metrics, and best practice recommendations. A written personalized care plan for preventive services as well as general preventive health recommendations were provided to patient.     Renato Gails, [place]   01/27/2022   Nurse Notes:  Casey Petty , Thank you for taking time to come for your Medicare Wellness Visit. I appreciate your ongoing commitment to your health goals. Please review the following plan we discussed and let me know if I can assist you in the future.   These are the goals we discussed:  Goals   None     This is a list of the  screening recommended for you and due dates:  Health Maintenance  Topic Date Due   COVID-19 Vaccine (1) Never done   Eye exam for diabetics  11/18/2021   Zoster (Shingles) Vaccine (1 of 2) 04/21/2022*   Tetanus Vaccine  09/13/2022*   Flu Shot  03/08/2022   Complete foot exam   06/14/2022   Hemoglobin A1C  07/21/2022   Colon Cancer Screening  12/27/2028   Hepatitis C Screening: USPSTF Recommendation to screen - Ages 18-79 yo.  Completed   HIV Screening  Completed   HPV Vaccine  Aged Out  *Topic was postponed. The date shown is not the original due date.    I have reviewed and agreed to the above documentation.   Theresia Lo, FNP

## 2022-01-28 ENCOUNTER — Ambulatory Visit: Payer: Medicare Other | Admitting: Physical Therapy

## 2022-02-04 ENCOUNTER — Encounter: Payer: Self-pay | Admitting: Physical Medicine & Rehabilitation

## 2022-02-04 ENCOUNTER — Other Ambulatory Visit: Payer: Self-pay | Admitting: Family Medicine

## 2022-02-04 ENCOUNTER — Encounter: Payer: Medicare Other | Attending: Registered Nurse | Admitting: Physical Medicine & Rehabilitation

## 2022-02-04 VITALS — BP 118/72 | HR 75 | Ht 67.0 in | Wt 148.0 lb

## 2022-02-04 DIAGNOSIS — E1159 Type 2 diabetes mellitus with other circulatory complications: Secondary | ICD-10-CM

## 2022-02-04 DIAGNOSIS — I152 Hypertension secondary to endocrine disorders: Secondary | ICD-10-CM

## 2022-02-04 DIAGNOSIS — G8111 Spastic hemiplegia affecting right dominant side: Secondary | ICD-10-CM

## 2022-02-04 NOTE — Telephone Encounter (Signed)
Requested Prescriptions  Pending Prescriptions Disp Refills  . ezetimibe (ZETIA) 10 MG tablet [Pharmacy Med Name: ezetimibe 10 mg tablet] 90 tablet     Sig: TAKE ONE TABLET BY MOUTH EVERY EVENING     Cardiovascular:  Antilipid - Sterol Transport Inhibitors Failed - 02/04/2022  9:21 AM      Failed - AST in normal range and within 360 days    AST  Date Value Ref Range Status  09/01/2020 24 0 - 40 IU/L Final         Failed - ALT in normal range and within 360 days    ALT  Date Value Ref Range Status  09/01/2020 42 0 - 44 IU/L Final         Failed - Lipid Panel in normal range within the last 12 months    Cholesterol, Total  Date Value Ref Range Status  08/31/2021 158 100 - 199 mg/dL Final   LDL Chol Calc (NIH)  Date Value Ref Range Status  08/31/2021 87 0 - 99 mg/dL Final   HDL  Date Value Ref Range Status  08/31/2021 50 >39 mg/dL Final   Triglycerides  Date Value Ref Range Status  08/31/2021 116 0 - 149 mg/dL Final         Passed - Patient is not pregnant      Passed - Valid encounter within last 12 months    Recent Outpatient Visits          2 weeks ago Type 2 diabetes mellitus with other specified complication, with long-term current use of insulin ([place])   [place], [place], MD   4 months ago Type 2 diabetes mellitus with other specified complication, with long-term current use of insulin ([place])   [place], [employer], MD   8 months ago Type 2 diabetes mellitus with other specified complication, with long-term current use of insulin ([place])   [place], [place], MD   9 months ago Spastic hemiparesis [facility])   [place], Charlane Ferretti, MD   1 year ago Uncontrolled type 2 diabetes mellitus with hyperglycemia ([place])   [place], [place], MD      Future Appointments            In 5  months Charlott Rakes, MD [place]           . hydrALAZINE (APRESOLINE) 100 MG tablet [Pharmacy Med Name: hydralazine 100 mg tablet] 90 tablet     Sig: TAKE ONE TABLET BY MOUTH THREE TIMES DAILY FOR BLOOD PRESSURE Needs appointment for further refills     Cardiovascular:  Vasodilators Failed - 02/04/2022  9:21 AM      Failed - HCT in normal range and within 360 days    HCT  Date Value Ref Range Status  03/18/2019 46.2 39.0 - 52.0 % Final         Failed - HGB in normal range and within 360 days    Hemoglobin  Date Value Ref Range Status  03/18/2019 15.2 13.0 - 17.0 g/dL Final         Failed - RBC in normal range and within 360 days    RBC  Date Value Ref Range Status  03/18/2019 5.00 4.22 - 5.81 MIL/uL Final         Failed - WBC in normal range and within 360  days    WBC  Date Value Ref Range Status  03/18/2019 10.2 4.0 - 10.5 K/uL Final         Failed - PLT in normal range and within 360 days    Platelets  Date Value Ref Range Status  03/18/2019 278 150 - 400 K/uL Final         Failed - ANA Screen, Ifa, Serum in normal range and within 360 days    No results found for: "ANA", "ANATITER", "LABANTI"       Passed - Last BP in normal range    BP Readings from Last 1 Encounters:  01/19/22 138/72         Passed - Valid encounter within last 12 months    Recent Outpatient Visits          2 weeks ago Type 2 diabetes mellitus with other specified complication, with long-term current use of insulin ([place])   [place], [place], MD   4 months ago Type 2 diabetes mellitus with other specified complication, with long-term current use of insulin ([place])   [place], [place], MD   8 months ago Type 2 diabetes mellitus with other specified complication, with long-term current use of insulin ([place])   [place], [place], MD   9 months  ago Spastic hemiparesis [facility])   [place], Charlane Ferretti, MD   1 year ago Uncontrolled type 2 diabetes mellitus with hyperglycemia [facility])   [place], [place], MD      Future Appointments            In 5 months Charlott Rakes, MD [place]

## 2022-02-04 NOTE — Progress Notes (Signed)
Subjective:    Patient ID: Casey Petty, male    DOB: 10/29/1960, 61 y.o.   MRN: [id number]  HPI Patient is a 61 year old male with history of left CVA causing right spastic hemiplegia.  He is ambulatory.  He requires some assistance for self-care but otherwise is modified independent using a cane as well as right AFO for ambulation.  He has no pain complaints.  He is here for spasticity management. Wakes up early no issues Right botulinum toxin injection 6 wks ago  RIGHT    Biceps:            100                                     Brachioradialis 100                                     FCR                 200                                     PT                   200                                     FDS                 200                                     FDP                 100                                       Right    Soleus 100 FDL 100U Hamstrings 400U Pain Inventory Average Pain 0 Pain Right Now 0 My pain is  No pain at all  In the last 24 hours, has pain interfered with the following? General activity 0 Relation with others 0 Enjoyment of life 0 What TIME of day is your pain at its worst? No pain Sleep (in general) Good  Pain is worse with:  no pain Pain improves with: injections and no pain since last treatment Relief from Meds:  no taken for pain  Family History  Problem Relation Age of Onset   Heart attack Father    Cancer Neg Hx    Colon polyps Neg Hx    Colon cancer Neg Hx    Esophageal cancer Neg Hx    Stomach cancer Neg Hx    Rectal cancer Neg Hx    Social History   Socioeconomic History   Marital status: Single    Spouse name: Not on file   Number of children: Not on file   Years of education: Not on file   Highest education level: Not on file  Occupational History   Not on  file  Tobacco Use   Smoking status: Every Day    Packs/day: 0.25    Types: Cigarettes   Smokeless tobacco: Never   Tobacco comments:    Smoking 4  cigs/day  Vaping Use   Vaping Use: Never used  Substance and Sexual Activity   Alcohol use: Not Currently   Drug use: No   Sexual activity: Never  Other Topics Concern   Not on file  Social History Narrative   Not on file   Social Determinants of Health   Financial Resource Strain: Low Risk  (01/27/2022)   Overall Financial Resource Strain (CARDIA)    Difficulty of Paying Living Expenses: Not very hard  Food Insecurity: No Food Insecurity (01/27/2022)   Hunger Vital Sign    Worried About Running Out of Food in the Last Year: Never true    Ran Out of Food in the Last Year: Never true  Transportation Needs: No Transportation Needs (01/27/2022)   PRAPARE - Hydrologist (Medical): No    Lack of Transportation (Non-Medical): No  Physical Activity: Sufficiently Active (01/27/2022)   Exercise Vital Sign    Days of Exercise per Week: 3 days    Minutes of Exercise per Session: 60 min  Stress: No Stress Concern Present (01/27/2022)   [place]    Feeling of Stress : Not at all  Social Connections: Moderately Isolated (05/31/2021)   Social Connection and Isolation Panel [NHANES]    Frequency of Communication with Friends and Family: More than three times a week    Frequency of Social Gatherings with Friends and Family: More than three times a week    Attends Religious Services: Never    Marine scientist or Organizations: No    Attends Music therapist: Never    Marital Status: Living with partner   Past Surgical History:  Procedure Laterality Date   WISDOM TOOTH EXTRACTION Bilateral 06/2021   Past Surgical History:  Procedure Laterality Date   WISDOM TOOTH EXTRACTION Bilateral 06/2021   Past Medical History:  Diagnosis Date   Diabetes mellitus without complication ([place])    on meds   Hyperlipidemia    on meds   Hypertension    on meds   Stroke ([place]) 2020   R  hemiparesis-abnormal gait   Ht '5\' 7"'$  (1.702 m)   Wt 148 lb (67.1 kg)   BMI 23.18 kg/m   Opioid Risk Score:   Fall Risk Score:  `1  Depression screen [facility] 2/9     01/19/2022    9:27 AM 12/23/2021    9:21 AM 11/09/2021    9:47 AM 09/23/2021   10:00 AM 09/13/2021   11:28 AM 08/12/2021   12:47 PM 06/08/2021    2:38 PM  Depression screen PHQ 2/9  Decreased Interest 1 0 0 0 0 0 0  Down, Depressed, Hopeless 0 0 0 0 0  0  PHQ - 2 Score 1 0 0 0 0 0 0  Altered sleeping 0    0    Tired, decreased energy 0    0    Change in appetite 0    0    Feeling bad or failure about yourself  0    0    Trouble concentrating 0    0    Moving slowly or fidgety/restless 0    0    Suicidal thoughts 0    0  PHQ-9 Score 1    0      Review of Systems  Musculoskeletal:  Positive for gait problem.  All other systems reviewed and are negative.      Objective:   Physical Exam   Can ambulate without assistive device with his right AFO on.  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 Motor strength is 3 - at the right deltoid by stress of grip 4 at the hip flexor knee extensor and 3 - at the ankle dorsiflexor. Speech without dysarthria or aphasia.     Assessment & Plan:   1.  Right spastic hemiparesis secondary left CVA he did well with the last botulinum toxin injection.  We will continue same dose as well as same muscle group selection.  We will see him back in 6 weeks to repeat.  Discussed with patient and his wife

## 2022-02-04 NOTE — Telephone Encounter (Signed)
Requested medication (s) are due for refill today: yes  Requested medication (s) are on the active medication list: historical med  Last refill:  12/23/21  Future visit scheduled: yes  Notes to clinic:  historical med and provider   Requested Prescriptions  Pending Prescriptions Disp Refills   ezetimibe (ZETIA) 10 MG tablet [Pharmacy Med Name: ezetimibe 10 mg tablet] 90 tablet     Sig: TAKE ONE TABLET BY MOUTH EVERY EVENING     Cardiovascular:  Antilipid - Sterol Transport Inhibitors Failed - 02/04/2022  9:21 AM      Failed - AST in normal range and within 360 days    AST  Date Value Ref Range Status  09/01/2020 24 0 - 40 IU/L Final         Failed - ALT in normal range and within 360 days    ALT  Date Value Ref Range Status  09/01/2020 42 0 - 44 IU/L Final         Failed - Lipid Panel in normal range within the last 12 months    Cholesterol, Total  Date Value Ref Range Status  08/31/2021 158 100 - 199 mg/dL Final   LDL Chol Calc (NIH)  Date Value Ref Range Status  08/31/2021 87 0 - 99 mg/dL Final   HDL  Date Value Ref Range Status  08/31/2021 50 >39 mg/dL Final   Triglycerides  Date Value Ref Range Status  08/31/2021 116 0 - 149 mg/dL Final         Passed - Patient is not pregnant      Passed - Valid encounter within last 12 months    Recent Outpatient Visits           2 weeks ago Type 2 diabetes mellitus with other specified complication, with long-term current use of insulin ([place])   [place], Charlane Ferretti, MD   4 months ago Type 2 diabetes mellitus with other specified complication, with long-term current use of insulin ([place])   [place], Charlane Ferretti, MD   8 months ago Type 2 diabetes mellitus with other specified complication, with long-term current use of insulin ([place])   [place], [place], MD   9 months ago Spastic hemiparesis ([place])   [place], Charlane Ferretti, MD   1 year ago Uncontrolled type 2 diabetes mellitus with hyperglycemia ([place])   [place], Charlane Ferretti, MD       Future Appointments             In 5 months Charlott Rakes, MD [place]            Refused Prescriptions Disp Refills   hydrALAZINE (APRESOLINE) 100 MG tablet [Pharmacy Med Name: hydralazine 100 mg tablet] 90 tablet     Sig: TAKE ONE TABLET BY [place] BLOOD PRESSURE Needs appointment for further refills     Cardiovascular:  Vasodilators Failed - 02/04/2022  9:21 AM      Failed - HCT in normal range and within 360 days    HCT  Date Value Ref Range Status  03/18/2019 46.2 39.0 - 52.0 % Final         Failed - HGB in normal range and within 360 days    Hemoglobin  Date Value Ref Range Status  03/18/2019 15.2 13.0 - 17.0 g/dL Final  Failed - RBC in normal range and within 360 days    RBC  Date Value Ref Range Status  03/18/2019 5.00 4.22 - 5.81 MIL/uL Final         Failed - WBC in normal range and within 360 days    WBC  Date Value Ref Range Status  03/18/2019 10.2 4.0 - 10.5 K/uL Final         Failed - PLT in normal range and within 360 days    Platelets  Date Value Ref Range Status  03/18/2019 278 150 - 400 K/uL Final         Failed - ANA Screen, Ifa, Serum in normal range and within 360 days    No results found for: "ANA", "ANATITER", "LABANTI"       Passed - Last BP in normal range    BP Readings from Last 1 Encounters:  02/04/22 118/72         Passed - Valid encounter within last 12 months    Recent Outpatient Visits           2 weeks ago Type 2 diabetes mellitus with other specified complication, with long-term current use of insulin ([place])   [place], [place], MD   4 months ago Type 2 diabetes mellitus with other specified complication, with long-term  current use of insulin ([place])   [place], [place], MD   8 months ago Type 2 diabetes mellitus with other specified complication, with long-term current use of insulin ([place])   [place], [place], MD   9 months ago Spastic hemiparesis [facility])   [place], Charlane Ferretti, MD   1 year ago Uncontrolled type 2 diabetes mellitus with hyperglycemia [facility])   [place], Enob[place], MD       Future Appointments             In 5 months Charlott Rakes, MD [place]

## 2022-02-10 ENCOUNTER — Ambulatory Visit: Payer: Medicare Other | Admitting: Physical Therapy

## 2022-02-10 ENCOUNTER — Other Ambulatory Visit: Payer: Self-pay | Admitting: Family Medicine

## 2022-02-10 DIAGNOSIS — G8110 Spastic hemiplegia affecting unspecified side: Secondary | ICD-10-CM

## 2022-02-18 ENCOUNTER — Ambulatory Visit: Payer: Medicare Other | Attending: Family Medicine | Admitting: Occupational Therapy

## 2022-02-18 ENCOUNTER — Encounter: Payer: Self-pay | Admitting: Occupational Therapy

## 2022-02-18 DIAGNOSIS — M6281 Muscle weakness (generalized): Secondary | ICD-10-CM | POA: Diagnosis not present

## 2022-02-18 DIAGNOSIS — I69351 Hemiplegia and hemiparesis following cerebral infarction affecting right dominant side: Secondary | ICD-10-CM | POA: Diagnosis not present

## 2022-02-18 DIAGNOSIS — R2689 Other abnormalities of gait and mobility: Secondary | ICD-10-CM | POA: Diagnosis not present

## 2022-02-18 NOTE — Therapy (Signed)
OUTPATIENT OCCUPATIONAL THERAPY NEURO EVALUATION  Patient Name: Casey Petty MRN: [id number] DOB:1960/12/06, 61 y.o., male Today's Date: 02/18/2022  PCP: Charlott Rakes, MD REFERRING PROVIDER: Charlott Rakes, MD    OT End of Session - 02/18/22 0848     Visit Number 1    Number of Visits 10    Date for OT Re-Evaluation 04/29/22   extended to allow for interruption due to Botox injection   Authorization Type UHC MCR & MCD    Authorization Time Period VL: [place]    Progress Note Due on Visit 10    OT Start Time 0846    OT Stop Time 0920    OT Time Calculation (min) 34 min    Behavior During Therapy [facility] for tasks assessed/performed            Past Medical History:  Diagnosis Date   Diabetes mellitus without complication ([place])    on meds   Hyperlipidemia    on meds   Hypertension    on meds   Stroke ([place]) 2020   R hemiparesis-abnormal gait   Past Surgical History:  Procedure Laterality Date   WISDOM TOOTH EXTRACTION Bilateral 06/2021   Patient Active Problem List   Diagnosis Date Noted   Chronic right shoulder pain 06/22/2020   Adhesive capsulitis of right shoulder 10/07/2019   Abnormality of gait 09/17/2019   Spastic hemiparesis of right dominant side ([place]) 07/02/2019   History of CVA with residual deficit 06/09/2019   Spastic hemiparesis ([place])    Uncontrolled type 2 diabetes mellitus with hyperglycemia ([place])    Noncompliance    Benign essential HTN    Newly diagnosed diabetes ([place])    Leucocytosis    Right hemiparesis ([place])    Basal ganglia stroke ([place]) 03/04/2019   Stroke ([place]) 02/28/2019   Essential hypertension 02/28/2019   Tobacco abuse 02/28/2019   Hyperglycemia 02/28/2019    ONSET DATE: 02/28/2019  REFERRING DIAG: G81.10 (ICD-10-CM) - Spastic hemiparesis (HCC)   THERAPY DIAG:  Spastic hemiparesis of right dominant side as late effect of cerebral infarction ([place])  Muscle weakness (generalized)  Other abnormalities of gait and  mobility  Rationale for Evaluation and Treatment Habilitation  SUBJECTIVE:   SUBJECTIVE STATEMENT: Pt reports he had a stroke in 2020 and would like to return to OP rehab to improve function of his R hand. States he has had OP OT and PT at the neurorehab center on [address] and consistently receives Botox injections about every 3 months. Pt accompanied by: self and significant other Andria Frames)  PERTINENT HISTORY: RUE spastic hemiparesis 2/2 L subcortical lacunar infarct in July 2020; HTN  PRECAUTIONS: Fall; no driving  WEIGHT BEARING RESTRICTIONS: No  PAIN: Are you having pain? No  FALLS: Has patient fallen in last 6 months? No  LIVING ENVIRONMENT: Lives with: lives with their spouse Lives in: House/apartment Stairs: No Has following equipment at home: Lobbyist, Environmental consultant - 4 wheeled, and shower chair  PLOF:  Requires assistance w/ some BADLs; limited participation in IADLs; requires AD for functional mobility  PATIENT GOALS: "I don't know;" "open and close my hand"   OBJECTIVE:   HAND DOMINANCE: Right  ADLs: Overall ADLs: Mod Ind w/ most BADLs Eating: Mod Ind; requires assist cutting up tough food Grooming: Mod Ind UB Dressing: Mod Ind LB Dressing: Min A; requires assist to don RLE AFO Toileting: Requires assist for clothing management Bathing: Requires assist for washing back Tub Shower transfers: CGA  IADLs: Light housekeeping: Not participating  at this time Meal Prep: Mod Ind Community mobility: Relies on others/community transportation Medication management: Independent Handwriting:  Uses non-dominant UE  MOBILITY STATUS:  Ambulated in/out of session w/ Mod Ind using small-base quad cane and RLE AFO  FUNCTIONAL OUTCOME MEASURES: FOTO: 40 (severe)  UPPER EXTREMITY ROM:  Active ROM Right Eval - 7/14  Shoulder flexion 118 deg w/ compensatory trunk movements  Shoulder abduction 98  Shoulder adduction   Shoulder extension   Shoulder internal  rotation [facility]  Shoulder external rotation Unable  Elbow flexion 133  Elbow extension 35  Wrist flexion 19  Wrist extension 0  Wrist ulnar deviation   Wrist radial deviation   Wrist pronation   Wrist supination Unable      (Blank rows = not tested) *AROM of LUE WFL  Passive ROM Right Eval - 7/14  Shoulder flexion To be assessed  Shoulder abduction   Shoulder adduction   Shoulder extension   Shoulder internal rotation   Shoulder external rotation To be assessed  Elbow flexion 154  Elbow extension 0  Wrist flexion WFL  Wrist extension 14  Wrist ulnar deviation   Wrist radial deviation   Wrist pronation   Wrist supination Able to achieve slightly greater than neutral      (Blank rows = not tested)  HAND FUNCTION: Fingers extended w/ MPJs flexed to 60 degrees. Pt able to achieve loose grasp w/ approx 90% of finger flexion, but unable to release/extend fingers back to starting position  COORDINATION: Impaired; unable to achieve isolated finger movement  SENSATION: WFL  MUSCLE TONE: RUE: Moderate and Hypertonic  COGNITION: Overall cognitive status:  To be further assessed in functional context  VISION: Subjective report: Denies concerns at this time  OBSERVATIONS: Pt exhibits mild dysarthria; potential mild expressive aphasia   TODAY'S TREATMENT:  None   PATIENT EDUCATION: Educated on role and purpose of OT as well as potential interventions and goals for therapy based on initial evaluation findings. Also reviewed condition-specific education, including neuro reed and typical recovery patterns related to chronic CVA. Person educated: Patient and Spouse Education method: Explanation Education comprehension: verbalized understanding   HOME EXERCISE PROGRAM: To be assessed, updated, and/or administered   GOALS: Goals reviewed with patient? Yes  SHORT TERM GOALS: Target date: 03/25/22  STG  Status:  1 Pt will demonstrate independence w/ HEP for RUE  ROM Baseline: has prior program; to be assessed/updated Initial  2 Pt to demonstrate use R hand as stabalizer during simple bilateral task for improved participation in light IADLs Baseline: inconsistent use of RUE during functional tasks Initial  3 Pt to demonstrate understanding of AE recommended for improved independence w/ ADLs (long-handle sponge, rocker knife/adaptive cutting board, reacher) Baseline: Requires assist to wash back, cut tough foods, and pull up pants Initial     LONG TERM GOALS: Target date: 04/29/22  LTG  Status:  1 Pt to improve FOTO functional status to at least 45 to indicate decreased functional impact of RUE hemiparesis Baseline: Intake score: 40 Initial  2 Pt will demonstrate ability to pull up pants over hips at Mod Ind, incorporating RUE/R hand at gross assist level and/or use of AE prn Baseline: Min A Initial  3 Pt will be able to achieve finger extension ~50% of the time for improved gross grasp and release of R hand Baseline: able to achieve gross grasp; unable to release Initial    ASSESSMENT:  CLINICAL IMPRESSION: Pt is a 61 y/o who presents to OP OT due  to spastic RUE hemiparesis as late effect of L basal ganglia CVA in July of 2020. PMH includes HTN. Pt currently lives with his wife in a single-level home and has not worked since onset. Pt will benefit from skilled occupational therapy services to address ROM, balance, GMC/coordination, introduction of relevant compensatory strategies/AE prn, and progression of HEP to improve participation and safety during self-care tasks and IADLs.   PERFORMANCE DEFICITS in functional skills including coordination, dexterity, sensation, tone, ROM, strength, muscle spasms, FMC, GMC, mobility, balance, body mechanics, and UE functional use.   IMPAIRMENTS are limiting patient from ADLs, IADLs, work, leisure, and social participation.   COMORBIDITIES may have co-morbidities  that affects occupational performance. Patient  will benefit from skilled OT to address above impairments and improve overall function.  MODIFICATION OR ASSISTANCE TO COMPLETE EVALUATION: No modification of tasks or assist necessary to complete an evaluation.  OT OCCUPATIONAL PROFILE AND HISTORY: Problem focused assessment: Including review of records relating to presenting problem.  CLINICAL DECISION MAKING: Moderate - several treatment options, min-mod task modification necessary  REHAB POTENTIAL: Fair due to time since onset  EVALUATION COMPLEXITY: Moderate   PLAN: OT FREQUENCY: 1x/week  OT DURATION: 10 weeks  PLANNED INTERVENTIONS: self care/ADL training, therapeutic exercise, therapeutic activity, neuromuscular re-education, manual therapy, passive range of motion, functional mobility training, aquatic therapy, splinting, electrical stimulation, ultrasound, fluidotherapy, moist heat, cryotherapy, patient/family education, and DME and/or AE instructions  RECOMMENDED OTHER SERVICES: None  CONSULTED AND AGREED WITH PLAN OF CARE: Patient and family member/caregiver  PLAN FOR NEXT SESSION: Review previously administered HEP; PROM and self-PROM exercises for R shoulder, elbow, wrist, and hand; discuss positional recommendations   Kathrine Cords, MSOT, OTR/L 02/18/2022, 9:57 AM

## 2022-02-21 ENCOUNTER — Ambulatory Visit: Payer: Medicare Other | Admitting: Occupational Therapy

## 2022-02-21 ENCOUNTER — Encounter: Payer: Self-pay | Admitting: Occupational Therapy

## 2022-02-21 DIAGNOSIS — I69351 Hemiplegia and hemiparesis following cerebral infarction affecting right dominant side: Secondary | ICD-10-CM

## 2022-02-21 DIAGNOSIS — M6281 Muscle weakness (generalized): Secondary | ICD-10-CM

## 2022-02-21 DIAGNOSIS — R2689 Other abnormalities of gait and mobility: Secondary | ICD-10-CM

## 2022-02-21 NOTE — Therapy (Signed)
OUTPATIENT OCCUPATIONAL THERAPY TREATMENT NOTE  Patient Name: Casey Petty MRN: [id number] DOB:1961-07-18, 61 y.o., male Today's Date: 02/21/2022  PCP: Charlott Rakes, MD REFERRING PROVIDER: Charlott Rakes, MD    OT End of Session - 02/21/22 0935     Visit Number 2    Number of Visits 10    Date for OT Re-Evaluation 04/29/22   extended to allow for interruption due to Botox injection   Authorization Type UHC MCR & MCD    Authorization Time Period VL: [place]    Progress Note Due on Visit 10    OT Start Time 0932    OT Stop Time 1014    OT Time Calculation (min) 42 min    Activity Tolerance Patient tolerated treatment well    Behavior During Therapy WFL for tasks assessed/performed            Past Medical History:  Diagnosis Date   Diabetes mellitus without complication ([place])    on meds   Hyperlipidemia    on meds   Hypertension    on meds   Stroke ([place]) 2020   R hemiparesis-abnormal gait   Past Surgical History:  Procedure Laterality Date   WISDOM TOOTH EXTRACTION Bilateral 06/2021   Patient Active Problem List   Diagnosis Date Noted   Chronic right shoulder pain 06/22/2020   Adhesive capsulitis of right shoulder 10/07/2019   Abnormality of gait 09/17/2019   Spastic hemiparesis of right dominant side ([place]) 07/02/2019   History of CVA with residual deficit 06/09/2019   Spastic hemiparesis ([place])    Uncontrolled type 2 diabetes mellitus with hyperglycemia ([place])    Noncompliance    Benign essential HTN    Newly diagnosed diabetes ([place])    Leucocytosis    Right hemiparesis ([place])    Basal ganglia stroke ([place]) 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   Stroke ([place]) 02/28/2019   Essential hypertension 02/28/2019   Tobacco abuse 02/28/2019   Hyperglycemia 02/28/2019    ONSET DATE: 02/28/2019  REFERRING DIAG: G81.10 (ICD-10-CM) - Spastic hemiparesis ([place])   THERAPY DIAG:  Spastic hemiparesis of right dominant side as late effect of cerebral infarction ([place])  Muscle weakness  (generalized)  Other abnormalities of gait and mobility  Rationale for Evaluation and Treatment Habilitation  SUBJECTIVE:   SUBJECTIVE STATEMENT: "I have the handouts from the last time I was in therapy" Pt accompanied by: self and significant other Andria Frames)  PAIN: Are you having pain? No  PERTINENT HISTORY: RUE spastic hemiparesis 2/2 L subcortical lacunar infarct in July 2020; HTN  PRECAUTIONS: Fall; no driving  PLOF:  Requires assistance w/ some BADLs; limited participation in IADLs; requires AD for functional mobility  PATIENT GOALS: "I don't know;" "open and close my hand"   OBJECTIVE:   ------------------------------------------------------------------------------------------------------------------------------------------------------ MEASUREMENTS BELOW TAKEN AT TIME OF EVALUATION UNLESS DESIGNATED OTHERWISE   HAND DOMINANCE: Right  UPPER EXTREMITY ROM:  Active ROM Right Eval - 7/14  Shoulder flexion 118 deg w/ compensatory trunk movements  Shoulder abduction 98  Shoulder adduction   Shoulder extension   Shoulder internal rotation [facility]  Shoulder external rotation Unable  Elbow flexion 133  Elbow extension 35  Wrist flexion 19  Wrist extension 0  Wrist ulnar deviation   Wrist radial deviation   Wrist pronation   Wrist supination Unable      (Blank rows = not tested) *AROM of LUE WFL  Passive ROM Right Eval - 7/14  Shoulder flexion To be assessed  Shoulder abduction   Shoulder adduction   Shoulder extension  Shoulder internal rotation   Shoulder external rotation To be assessed  Elbow flexion 154  Elbow extension 0  Wrist flexion WFL  Wrist extension 14  Wrist ulnar deviation   Wrist radial deviation   Wrist pronation   Wrist supination Able to achieve slightly greater than neutral      (Blank rows = not tested)  HAND FUNCTION: Fingers extended w/ MPJs flexed to 60 degrees. Pt able to achieve loose grasp w/ approx 90% of finger flexion, but  unable to release/extend fingers back to starting position  ------------------------------------------------------------------------------------------------------------------------------------------------------  TODAY'S TREATMENT - 02/21/22:  AAROM Closed-chain AAROM of BUE shoulder flex w/ unweighted dowel and pt in supine to facilitate scapular alignment w/ retraction and decrease compensatory patterns observed when attempting movement while seated. Completed 2x10 w/ occ tactile and verbal cues to decrease trunk ext for increased focus on shoulder movement  PROM OT performed gentle PROM of R shoulder ER w/ arm adducted and then slightly abducted, elbow extension, forearm supination, and wrist extension w/ fingers flexed 10x slowly each. Completed all w/ pt in supine, incorporating stretch held at end range to increase ROM for increased functional use of RUE at gross assist level. Able to achieve 32 deg of ER, 23 deg of elbow ext, forearm sup slightly past neutral, and wrist ext to neutral. Pt tolerated w/out discomfort; PROM limited by increased tone.  OT then problem-solved w/ pt to identify effective self-stretches, focusing on shoulder ER w/ cane, forearm supination, and wrist extension. Able to return demonstration of exercises w/ min demo/modeling and verbal cues for alignment and positioning.    PATIENT EDUCATION: Ongoing condition-specific education related to therapeutic interventions completed this session and importance of completing stretching exercises at home for spasticity management Person educated: Patient and Spouse Education method: Explanation and Demonstration Education comprehension: verbalized understanding   HOME EXERCISE PROGRAM: Access Code: [email or web address] - Supine Shoulder External Rotation AAROM with Dowel  - 2 x daily - 2 sets - 10 reps - 5 sec hold - Forearm Supination PROM  - 2 x daily - 2 sets - 10 reps - 5 sec hold - Seated AAROM Wrist Flexion/Extension with Clasped  Hands  - 2 x daily - 2 sets - 10 reps - 5 sec hold   GOALS: Goals reviewed with patient? Yes  SHORT TERM GOALS: Target date: 03/25/22  STG  Status:  1 Pt will demonstrate independence w/ HEP for RUE ROM Baseline: has prior program; to be assessed/updated Progressing  2 Pt to demonstrate use R hand as stabalizer during simple bilateral task for improved participation in light IADLs Baseline: inconsistent use of RUE during functional tasks Progressing  3 Pt to demonstrate understanding of AE recommended for improved independence w/ ADLs (long-handle sponge, rocker knife/adaptive cutting board, reacher) Baseline: Requires assist to wash back, cut tough foods, and pull up pants Progressing     LONG TERM GOALS: Target date: 04/29/22  LTG  Status:  1 Pt to improve FOTO functional status to at least 45 to indicate decreased functional impact of RUE hemiparesis Baseline: Intake score: 40 Progressing  2 Pt will demonstrate ability to pull up pants over hips at Mod Ind, incorporating RUE/R hand at gross assist level and/or use of AE prn Baseline: Min A Progressing  3 Pt will be able to achieve finger extension ~50% of the time for improved gross grasp and release of R hand Baseline: able to achieve gross grasp; unable to release Progressing    ASSESSMENT:  CLINICAL IMPRESSION: Pt  returns for first treatment session following initial evaluation on 02/18/22. OT reviewed all goals and POC w/ pt who is agreeable at this time. Session today focused on AAROM and PROM for R shoulder, elbow, forearm, and wrist w/ emphasis on completing exercises consistently at home as a large component of spasticity management. Increased tone and spasticity are most limiting to functional use of RUE w/ pt benefiting from ongoing condition-specific education and importance of HEP carryover.  PERFORMANCE DEFICITS in functional skills including coordination, dexterity, sensation, tone, ROM, strength, muscle spasms, FMC,  GMC, mobility, balance, body mechanics, and UE functional use.   IMPAIRMENTS are limiting patient from ADLs, IADLs, work, leisure, and social participation.   COMORBIDITIES may have co-morbidities  that affects occupational performance. Patient will benefit from skilled OT to address above impairments and improve overall function.   PLAN: OT FREQUENCY: 1x/week  OT DURATION: 10 weeks  PLANNED INTERVENTIONS: self care/ADL training, therapeutic exercise, therapeutic activity, neuromuscular re-education, manual therapy, passive range of motion, functional mobility training, aquatic therapy, splinting, electrical stimulation, ultrasound, fluidotherapy, moist heat, cryotherapy, patient/family education, and DME and/or AE instructions  RECOMMENDED OTHER SERVICES: None  CONSULTED AND AGREED WITH PLAN OF CARE: Patient and family member/caregiver  PLAN FOR NEXT SESSION: Review previously administered HEP; PROM and self-PROM exercises for R shoulder, elbow, wrist, and hand; discuss positional recommendations   Kathrine Cords, MSOT, OTR/L 02/21/2022, 10:28 AM

## 2022-02-28 ENCOUNTER — Ambulatory Visit: Payer: Medicare Other | Admitting: Occupational Therapy

## 2022-02-28 ENCOUNTER — Encounter: Payer: Self-pay | Admitting: Occupational Therapy

## 2022-02-28 DIAGNOSIS — M6281 Muscle weakness (generalized): Secondary | ICD-10-CM

## 2022-02-28 DIAGNOSIS — I69351 Hemiplegia and hemiparesis following cerebral infarction affecting right dominant side: Secondary | ICD-10-CM

## 2022-02-28 DIAGNOSIS — R2689 Other abnormalities of gait and mobility: Secondary | ICD-10-CM | POA: Diagnosis not present

## 2022-02-28 NOTE — Therapy (Signed)
OUTPATIENT OCCUPATIONAL THERAPY TREATMENT NOTE  Patient Name: Casey Petty MRN: [id number] DOB:May 01, 1961, 61 y.o., male Today's Date: 02/28/2022  PCP: Charlott Rakes, MD REFERRING PROVIDER: Charlott Rakes, MD    OT End of Session - 02/28/22 0845     Visit Number 3    Number of Visits 10    Date for OT Re-Evaluation 04/29/22   extended to allow for interruption due to Botox injection   Authorization Type UHC MCR & MCD    Authorization Time Period VL: [place]    Progress Note Due on Visit 10    OT Start Time 0844    OT Stop Time 0925    OT Time Calculation (min) 41 min    Activity Tolerance Patient tolerated treatment well    Behavior During Therapy [facility] for tasks assessed/performed            Past Medical History:  Diagnosis Date   Diabetes mellitus without complication ([place])    on meds   Hyperlipidemia    on meds   Hypertension    on meds   Stroke ([place]) 2020   R hemiparesis-abnormal gait   Past Surgical History:  Procedure Laterality Date   WISDOM TOOTH EXTRACTION Bilateral 06/2021   Patient Active Problem List   Diagnosis Date Noted   Chronic right shoulder pain 06/22/2020   Adhesive capsulitis of right shoulder 10/07/2019   Abnormality of gait 09/17/2019   Spastic hemiparesis of right dominant side ([place]) 07/02/2019   History of CVA with residual deficit 06/09/2019   Spastic hemiparesis ([place])    Uncontrolled type 2 diabetes mellitus with hyperglycemia ([place])    Noncompliance    Benign essential HTN    Newly diagnosed diabetes ([place])    Leucocytosis    Right hemiparesis ([place])    Basal ganglia stroke ([place]) 03/04/2019   Stroke ([place]) 02/28/2019   Essential hypertension 02/28/2019   Tobacco abuse 02/28/2019   Hyperglycemia 02/28/2019    ONSET DATE: 02/28/2019  REFERRING DIAG: G81.10 (ICD-10-CM) - Spastic hemiparesis (HCC)   THERAPY DIAG:  Spastic hemiparesis of right dominant side as late effect of cerebral infarction ([place])  Muscle weakness  (generalized)  Other abnormalities of gait and mobility  Rationale for Evaluation and Treatment Habilitation  SUBJECTIVE:   SUBJECTIVE STATEMENT: Pt reports not being able to remember the last time he was able to sleep w/ the custom-fit resting hand orthosis on at night; also states he has a prefabricated resting hand splint that he received about 2 months ago Pt accompanied by: self and significant other Andria Frames)  PAIN: Are you having pain? No  PERTINENT HISTORY: RUE spastic hemiparesis 2/2 L subcortical lacunar infarct in July 2020; HTN  PRECAUTIONS: Fall; no driving  PLOF:  Requires assistance w/ some BADLs; limited participation in IADLs; requires AD for functional mobility  PATIENT GOALS: "I don't know;" "open and close my hand"   OBJECTIVE:   ------------------------------------------------------------------------------------------------------------------------------------------------------ MEASUREMENTS BELOW TAKEN AT TIME OF EVALUATION UNLESS DESIGNATED OTHERWISE   HAND DOMINANCE: Right  UPPER EXTREMITY ROM:  Active ROM Right Eval - 7/14  Shoulder flexion 118 deg w/ compensatory trunk movements  Shoulder abduction 98  Shoulder adduction   Shoulder extension   Shoulder internal rotation [facility]  Shoulder external rotation Unable  Elbow flexion 133  Elbow extension 35  Wrist flexion 19  Wrist extension 0  Wrist ulnar deviation   Wrist radial deviation   Wrist pronation   Wrist supination Unable      (Blank rows = not tested) *AROM  of LUE WFL  Passive ROM Right Eval - 7/14  Shoulder flexion To be assessed  Shoulder abduction   Shoulder adduction   Shoulder extension   Shoulder internal rotation   Shoulder external rotation To be assessed  Elbow flexion 154  Elbow extension 0  Wrist flexion WFL  Wrist extension 14  Wrist ulnar deviation   Wrist radial deviation   Wrist pronation   Wrist supination Able to achieve slightly greater than neutral       (Blank rows = not tested)  HAND FUNCTION: Fingers extended w/ MPJs flexed to 60 degrees. Pt able to achieve loose grasp w/ approx 90% of finger flexion, but unable to release/extend fingers back to starting position  ------------------------------------------------------------------------------------------------------------------------------------------------------  TODAY'S TREATMENT - 02/28/22:  Orthosis Management OT assessed fit of custom-fit resting hand orthosis for R hand fabricated and administered to patient in 2021. Initially, wrist was measured to be in 16 degrees of flexion at rest w/ gap between volar wrist and forearm trough; 5th finger 50% flexed; thumb MPJ flexed to about 70 deg and forearm trough around proximal forearm appeared tight. OT was able to adjust strapping to decrease flexion of wrist and increased width of forearm trough on both ulnar and radial sides. After completing PROM to forearm, wrist, and hand, OT reassessed fit of orthosis determining good fit around proximal forearm w/ wrist flexed slightly past neutral and volar side fitting flush against trough. Education provided on adjusted wear and care to facilitate compliance.  PROM OT performed PROM of R forearm sup, wrist ext, and composite wrist and hand ext, incorporating low load, long duration stretch held at end range to increase ROM for potential increased functional use of RUE at gross assist level. Pt tolerated w/out discomfort; PROM limited by increased tone. Able to achieve wrist and hand extended to neutral w/ forearm pronated this session.    PATIENT EDUCATION: Ongoing condition-specific education related to therapeutic interventions completed this session and importance of completing stretching exercises at home for spasticity management Person educated: Patient and Spouse Education method: Explanation and Demonstration Education comprehension: verbalized understanding   HOME EXERCISE PROGRAM: Access  Code: [email or web address] - Supine Shoulder External Rotation AAROM with Dowel  - 2 x daily - 2 sets - 10 reps - 5 sec hold - Forearm Supination PROM  - 2 x daily - 2 sets - 10 reps - 5 sec hold - Seated AAROM Wrist Flexion/Extension with Clasped Hands  - 2 x daily - 2 sets - 10 reps - 5 sec hold   GOALS: Goals reviewed with patient? Yes  SHORT TERM GOALS: Target date: 03/25/22  STG  Status:  1 Pt will demonstrate independence w/ HEP for RUE ROM Baseline: has prior program; to be assessed/updated Progressing  2 Pt to demonstrate use R hand as stabalizer during simple bilateral task for improved participation in light IADLs Baseline: inconsistent use of RUE during functional tasks Progressing  3 Pt to demonstrate understanding of AE recommended for improved independence w/ ADLs (long-handle sponge, rocker knife/adaptive cutting board, reacher) Baseline: Requires assist to wash back, cut tough foods, and pull up pants Progressing     LONG TERM GOALS: Target date: 04/29/22  LTG  Status:  1 Pt to improve FOTO functional status to at least 45 to indicate decreased functional impact of RUE hemiparesis Baseline: Intake score: 40 Progressing  2 Pt will demonstrate ability to pull up pants over hips at Mod Ind, incorporating RUE/R hand at gross assist level and/or use of  AE prn Baseline: Min A Progressing  3 Pt will be able to achieve finger extension ~50% of the time for improved gross grasp and release of R hand Baseline: able to achieve gross grasp; unable to release Progressing    ASSESSMENT:  CLINICAL IMPRESSION: Session today continued to focus on R forearm, wrist, and hand ROM w/ emphasis on completing exercises consistently at home as a large component of spasticity management. OT also assessed fit of previously administered custom resting hand orthosis, recommending pt wear orthosis for a few hours on/off during the day if he is unable to tolerate wear at night, and to complete PROM exercises  prior to donning to ensure appropriate fit. Due to difficulty achieving wrist extension in current orthosis, OT discussed potential benefit of additional antispasticity orthosis focusing on wrist extension. Increased tone and spasticity are most limiting to functional use of RUE w/ pt benefiting from ongoing condition-specific education and importance of HEP carryover.  PERFORMANCE DEFICITS in functional skills including coordination, dexterity, sensation, tone, ROM, strength, muscle spasms, FMC, GMC, mobility, balance, body mechanics, and UE functional use.   IMPAIRMENTS are limiting patient from ADLs, IADLs, work, leisure, and social participation.   COMORBIDITIES may have co-morbidities  that affects occupational performance. Patient will benefit from skilled OT to address above impairments and improve overall function.   PLAN: OT FREQUENCY: 1x/week  OT DURATION: 10 weeks  PLANNED INTERVENTIONS: self care/ADL training, therapeutic exercise, therapeutic activity, neuromuscular re-education, manual therapy, passive range of motion, functional mobility training, aquatic therapy, splinting, electrical stimulation, ultrasound, fluidotherapy, moist heat, cryotherapy, patient/family education, and DME and/or AE instructions  RECOMMENDED OTHER SERVICES: None  CONSULTED AND AGREED WITH PLAN OF CARE: Patient and family member/caregiver  PLAN FOR NEXT SESSION:  Anti-spasticity cone splint for wrist extension; review previously administered HEP; PROM and self-PROM exercises for R shoulder, elbow, wrist, and hand; discuss positional recommendations   Kathrine Cords, MSOT, OTR/L 02/28/2022, 9:58 AM

## 2022-03-08 ENCOUNTER — Encounter: Payer: Self-pay | Admitting: Occupational Therapy

## 2022-03-08 ENCOUNTER — Ambulatory Visit: Payer: Medicare Other | Attending: Family Medicine | Admitting: Occupational Therapy

## 2022-03-08 DIAGNOSIS — R2689 Other abnormalities of gait and mobility: Secondary | ICD-10-CM

## 2022-03-08 DIAGNOSIS — I69351 Hemiplegia and hemiparesis following cerebral infarction affecting right dominant side: Secondary | ICD-10-CM

## 2022-03-08 DIAGNOSIS — M6281 Muscle weakness (generalized): Secondary | ICD-10-CM | POA: Diagnosis not present

## 2022-03-08 NOTE — Therapy (Signed)
OUTPATIENT OCCUPATIONAL THERAPY TREATMENT NOTE  Patient Name: Neville Walston MRN: [id number] DOB:November 25, 1960, 61 y.o., male Today's Date: 03/08/2022  PCP: Charlott Rakes, MD REFERRING PROVIDER: Charlott Rakes, MD    OT End of Session - 03/08/22 8676     Visit Number 4    Number of Visits 10    Date for OT Re-Evaluation 04/29/22   extended to allow for interruption due to Botox injection   Authorization Type U[employer] MCR & MCD    Authorization Time Period VL: [place]    Progress Note Due on Visit 10    OT Start Time 0849    OT Stop Time 0930    OT Time Calculation (min) 41 min    Activity Tolerance Patient tolerated treatment well    Behavior During Therapy [facility] for tasks assessed/performed            Past Medical History:  Diagnosis Date   Diabetes mellitus without complication ([place])    on meds   Hyperlipidemia    on meds   Hypertension    on meds   Stroke ([place]) 2020   R hemiparesis-abnormal gait   Past Surgical History:  Procedure Laterality Date   WISDOM TOOTH EXTRACTION Bilateral 06/2021   Patient Active Problem List   Diagnosis Date Noted   Chronic right shoulder pain 06/22/2020   Adhesive capsulitis of right shoulder 10/07/2019   Abnormality of gait 09/17/2019   Spastic hemiparesis of right dominant side ([place]) 07/02/2019   History of CVA with residual deficit 06/09/2019   Spastic hemiparesis ([place])    Uncontrolled type 2 diabetes mellitus with hyperglycemia ([place])    Noncompliance    Benign essential HTN    Newly diagnosed diabetes ([place])    Leucocytosis    Right hemiparesis ([place])    Basal ganglia stroke ([place]) 03/04/2019   Stroke ([place]) 02/28/2019   Essential hypertension 02/28/2019   Tobacco abuse 02/28/2019   Hyperglycemia 02/28/2019    ONSET DATE: 02/28/2019  REFERRING DIAG: G81.10 (ICD-10-CM) - Spastic hemiparesis (HCC)   THERAPY DIAG:  Spastic hemiparesis of right dominant side as late effect of cerebral infarction (HCC)  Muscle weakness  (generalized)  Other abnormalities of gait and mobility  Rationale for Evaluation and Treatment Habilitation  SUBJECTIVE:   SUBJECTIVE STATEMENT: "My leg, my leg, my leg...it's acting up" Pt accompanied by: self and significant other Andria Frames)  PAIN: Are you having pain? No  PERTINENT HISTORY: RUE spastic hemiparesis 2/2 L subcortical lacunar infarct in July 2020; HTN  PRECAUTIONS: Fall; no driving  PLOF:  Requires assistance w/ some BADLs; limited participation in IADLs; requires AD for functional mobility  PATIENT GOALS: "I don't know;" "open and close my hand"   OBJECTIVE:   ------------------------------------------------------------------------------------------------------------------------------------------------------ MEASUREMENTS BELOW TAKEN AT TIME OF EVALUATION UNLESS DESIGNATED OTHERWISE   HAND DOMINANCE: Right  UPPER EXTREMITY ROM:  Active ROM Right Eval - 7/14  Shoulder flexion 118 deg w/ compensatory trunk movements  Shoulder abduction 98  Shoulder adduction   Shoulder extension   Shoulder internal rotation [facility]  Shoulder external rotation Unable  Elbow flexion 133  Elbow extension 35  Wrist flexion 19  Wrist extension 0  Wrist ulnar deviation   Wrist radial deviation   Wrist pronation   Wrist supination Unable      (Blank rows = not tested) *AROM of LUE WFL  Passive ROM Right Eval - 7/14  Shoulder flexion To be assessed  Shoulder abduction   Shoulder adduction   Shoulder extension   Shoulder internal rotation  Shoulder external rotation To be assessed  Elbow flexion 154  Elbow extension 0  Wrist flexion WFL  Wrist extension 14  Wrist ulnar deviation   Wrist radial deviation   Wrist pronation   Wrist supination Able to achieve slightly greater than neutral      (Blank rows = not tested)  HAND FUNCTION: Fingers extended w/ MPJs flexed to 60 degrees. Pt able to achieve loose grasp w/ approx 90% of finger flexion, but unable to  release/extend fingers back to starting position  ------------------------------------------------------------------------------------------------------------------------------------------------------  TODAY'S TREATMENT - 03/08/22:  Orthosis Management Education provided on purpose of pt's current resting hand orthosis assessed in prior session vs. potential benefit of anti-spasticity cone orthosis to improve wrist positioning and stretch prn; pt verbalized understanding of provided education  PROM OT performed PROM of R forearm sup, elbow ext, and wrist ext w/ fingers flexed, incorporating low load, long duration stretch held at end range to increase ROM for potential increased functional use of RUE at gross assist level. OT provided corresponding education on importance of maintaining hold of stretch for about 20 sec vs "bouncing" stretch or increased reps of PROM when completing exercises at home. Pt tolerated w/out discomfort w/ PROM limited by increased tone. Able to forearm sup slightly past neutral, 16 deg of elbow extension, and about 30 deg of wrist ext. Forearm and wrist PROM completed w/ forearm resting on tabletop; elbow ext completed w/ elbow positioned on arm elevation wedge.  Towel Slides Towel slides on tabletop for R shoulder flex/elbow ext w/ active-assist from LUE in order to facilitate increased ROM and stretch; OT provided min verbal cues to improve shoulder alignment during exercise   Gross Grasp and Release A/AAROM of R hand gross grasp and release w/ pt actively flexed fingers and then extending as much as able w/ OT providing active-assist of MPJ and IPJ extension each rep; completed 10x slowly w/ improved gross grasp w/ increased repetition    PATIENT EDUCATION: Ongoing condition-specific education related to therapeutic interventions completed this session and importance of completing stretching exercises at home for spasticity management Person educated: Patient and  Spouse Education method: Explanation and Demonstration Education comprehension: verbalized understanding   HOME EXERCISE PROGRAM: Access Code: [email or web address] - Supine Shoulder External Rotation AAROM with Dowel  - x2/day, 2x10, 5 sec hold - Forearm Supination PROM  - x2/day, 2x10, 20 sec hold - Seated AAROM Wrist Flexion/Extension with Clasped Hands  - x2/day, 2x10, 20 sec hold   GOALS: Goals reviewed with patient? Yes  SHORT TERM GOALS: Target date: 03/25/22  STG  Status:  1 Pt will demonstrate independence w/ HEP for RUE ROM Baseline: has prior program; to be assessed/updated Met - 03/08/22  2 Pt to demonstrate use R hand as stabalizer during simple bilateral task for improved participation in light IADLs Baseline: inconsistent use of RUE during functional tasks Progressing  3 Pt to demonstrate understanding of AE recommended for improved independence w/ ADLs (long-handle sponge, rocker knife/adaptive cutting board, reacher) Baseline: Requires assist to wash back, cut tough foods, and pull up pants Progressing     LONG TERM GOALS: Target date: 04/29/22  LTG  Status:  1 Pt to improve FOTO functional status to at least 45 to indicate decreased functional impact of RUE hemiparesis Baseline: Intake score: 40 Progressing  2 Pt will demonstrate ability to pull up pants over hips at Mod Ind, incorporating RUE/R hand at gross assist level and/or use of AE prn Baseline: Min A Progressing  3 Pt  will be able to achieve finger extension ~50% of the time for improved gross grasp and release of R hand Baseline: able to achieve gross grasp; unable to release Progressing    ASSESSMENT:  CLINICAL IMPRESSION: OT focused session today on review of current HEP to ensure appropriate carryover of exercises. Due to pt's reported compliance and demonstrated understanding of passive stretches to be completed for RUE this session, OT discussed potential benefit of going on-hold until after receiving Botox in a  little over 2 weeks. Pt is agreeable to plan and verbalizes understanding of importance of maintaining HEP. ROM to be reassessed when pt returns to therapy approx 1 week s/p injection and OT to fabricate custom anti-spasticity orthosis for improved positioning, particularly of wrist and hand. Increased tone and spasticity are most limiting to functional use of RUE w/ pt hopeful that botox injection will maximize benefit of therapy.  PERFORMANCE DEFICITS in functional skills including coordination, dexterity, sensation, tone, ROM, strength, muscle spasms, FMC, GMC, mobility, balance, body mechanics, and UE functional use.   IMPAIRMENTS are limiting patient from ADLs, IADLs, work, leisure, and social participation.   COMORBIDITIES may have co-morbidities  that affects occupational performance. Patient will benefit from skilled OT to address above impairments and improve overall function.   PLAN: OT FREQUENCY: 1x/week  OT DURATION: 10 weeks  PLANNED INTERVENTIONS: self care/ADL training, therapeutic exercise, therapeutic activity, neuromuscular re-education, manual therapy, passive range of motion, functional mobility training, aquatic therapy, splinting, electrical stimulation, ultrasound, fluidotherapy, moist heat, cryotherapy, patient/family education, and DME and/or AE instructions  RECOMMENDED OTHER SERVICES: None  CONSULTED AND AGREED WITH PLAN OF CARE: Patient and family member/caregiver  PLAN FOR NEXT SESSION: Pt to be put on hold after today until receiving botox to LUE; Anti-spasticity cone splint for wrist extension; review previously administered HEP; PROM and self-PROM exercises for R shoulder, elbow, wrist, and hand   Kathrine Cords, MSOT, OTR/L 03/08/2022, 10:42 AM

## 2022-03-14 ENCOUNTER — Ambulatory Visit: Payer: Medicare Other | Admitting: Occupational Therapy

## 2022-03-21 ENCOUNTER — Ambulatory Visit: Payer: Medicare Other | Admitting: Occupational Therapy

## 2022-03-25 ENCOUNTER — Encounter: Payer: Self-pay | Admitting: Physical Medicine & Rehabilitation

## 2022-03-25 ENCOUNTER — Encounter: Payer: Medicare Other | Attending: Registered Nurse | Admitting: Physical Medicine & Rehabilitation

## 2022-03-25 VITALS — BP 121/79 | HR 77 | Temp 98.1°F | Ht 67.0 in | Wt 147.8 lb

## 2022-03-25 DIAGNOSIS — G8111 Spastic hemiplegia affecting right dominant side: Secondary | ICD-10-CM

## 2022-03-25 DIAGNOSIS — G8110 Spastic hemiplegia affecting unspecified side: Secondary | ICD-10-CM | POA: Diagnosis not present

## 2022-03-25 MED ORDER — ABOBOTULINUMTOXINA 500 UNITS IM SOLR
1500.0000 [IU] | Freq: Once | INTRAMUSCULAR | Status: AC
  Administered 2022-03-25: 1500 [IU] via INTRAMUSCULAR

## 2022-03-25 NOTE — Progress Notes (Addendum)
Dysport Injection for spasticity using needle EMG guidance  Dilution: 200 Units/ml Indication: Severe spasticity which interferes with ADL,mobility and/or  hygiene and is unresponsive to medication management and other conservative care Informed consent was obtained after describing risks and benefits of the procedure with the patient. This includes bleeding, bruising, infection, excessive weakness, or medication side effects. A REMS form is on file and signed. Needle:  needle electrode Number of units per muscle RIGHT    Biceps:            100                                     Brachioradialis 100                                     FCR                 200                                     PT                   200                                     FDS                 200                                     FDP                 100                                       Right    Soleus 100 FDL 100U Hamstrings 400U All injections were done after obtaining appropriate EMG activity and after negative drawback for blood. The patient tolerated the procedure well. Post procedure instructions were given. A followup appointment was made 6 wk.   

## 2022-03-25 NOTE — Patient Instructions (Signed)

## 2022-04-01 ENCOUNTER — Ambulatory Visit: Payer: Medicare Other | Admitting: Occupational Therapy

## 2022-04-01 ENCOUNTER — Encounter: Payer: Self-pay | Admitting: Occupational Therapy

## 2022-04-01 DIAGNOSIS — R2689 Other abnormalities of gait and mobility: Secondary | ICD-10-CM | POA: Diagnosis not present

## 2022-04-01 DIAGNOSIS — M6281 Muscle weakness (generalized): Secondary | ICD-10-CM | POA: Diagnosis not present

## 2022-04-01 DIAGNOSIS — I69351 Hemiplegia and hemiparesis following cerebral infarction affecting right dominant side: Secondary | ICD-10-CM | POA: Diagnosis not present

## 2022-04-01 NOTE — Therapy (Unsigned)
OUTPATIENT OCCUPATIONAL THERAPY TREATMENT NOTE  Patient Name: Casey Petty MRN: [id number] DOB:February 22, 1961, 61 y.o., male Today's Date: 04/01/2022  PCP: Charlott Rakes, MD REFERRING PROVIDER: Charlott Rakes, MD    OT End of Session - 04/01/22 0847     Visit Number 5    Number of Visits 10    Date for OT Re-Evaluation 04/29/22   extended to allow for interruption due to Botox injection   Authorization Type UHC MCR & MCD    Authorization Time Period VL: [place]    Progress Note Due on Visit 10    OT Start Time 0845    OT Stop Time 0925    OT Time Calculation (min) 40 min    Activity Tolerance Patient tolerated treatment well    Behavior During Therapy [facility] for tasks assessed/performed            Past Medical History:  Diagnosis Date   Diabetes mellitus without complication ([place])    on meds   Hyperlipidemia    on meds   Hypertension    on meds   Stroke ([place]) 2020   R hemiparesis-abnormal gait   Past Surgical History:  Procedure Laterality Date   WISDOM TOOTH EXTRACTION Bilateral 06/2021   Patient Active Problem List   Diagnosis Date Noted   Chronic right shoulder pain 06/22/2020   Adhesive capsulitis of right shoulder 10/07/2019   Abnormality of gait 09/17/2019   Spastic hemiparesis of right dominant side ([place]) 07/02/2019   History of CVA with residual deficit 06/09/2019   Spastic hemiparesis ([place])    Uncontrolled type 2 diabetes mellitus with hyperglycemia ([place])    Noncompliance    Benign essential HTN    Newly diagnosed diabetes ([place])    Leucocytosis    Right hemiparesis ([place])    Basal ganglia stroke ([place]) 03/04/2019   Stroke ([place]) 02/28/2019   Essential hypertension 02/28/2019   Tobacco abuse 02/28/2019   Hyperglycemia 02/28/2019    ONSET DATE: 02/28/2019  REFERRING DIAG: G81.10 (ICD-10-CM) - Spastic hemiparesis ([place])   THERAPY DIAG:  Spastic hemiparesis of right dominant side as late effect of cerebral infarction (HCC)  Muscle weakness  (generalized)  Other abnormalities of gait and mobility  Rationale for Evaluation and Treatment Habilitation  SUBJECTIVE:   SUBJECTIVE STATEMENT: Pt reports RUE botox injections went well and he feels like his arm is moving a little better Pt accompanied by: self and significant other Andria Frames)  PAIN: Are you having pain? No  PERTINENT HISTORY: RUE spastic hemiparesis 2/2 L subcortical lacunar infarct in July 2020; HTN  PRECAUTIONS: Fall; no driving  PLOF:  Requires assistance w/ some BADLs; limited participation in IADLs; requires AD for functional mobility  PATIENT GOALS: "I don't know;" "open and close my hand"   OBJECTIVE:  ------------------------------------------------------------------------------------------------------------------------------------------------------ MEASUREMENTS BELOW TAKEN AT TIME OF EVALUATION UNLESS DESIGNATED OTHERWISE  HAND DOMINANCE: Right  UPPER EXTREMITY ROM:  Active ROM Right Eval - 7/14 Right 8/25  Shoulder flexion 118 deg w/ compensatory trunk movements 120  Shoulder abduction 98 83  Shoulder adduction    Shoulder extension    Shoulder internal rotation [facility]  Shoulder external rotation Unable unable  Elbow flexion 133 136  Elbow extension 35 25  Wrist flexion 19 30  Wrist extension 0 15  Wrist ulnar deviation    Wrist radial deviation    Wrist pronation    Wrist supination Unable       (Blank rows = not tested) *AROM of LUE WFL  Passive ROM Right Eval -  7/14 Right 8/25  Shoulder flexion    Shoulder abduction    Shoulder adduction    Shoulder extension    Shoulder internal rotation    Shoulder external rotation    Elbow flexion 154 147  Elbow extension 0 19  Wrist flexion WFL   Wrist extension 14 29  Wrist ulnar deviation    Wrist radial deviation    Wrist pronation    Wrist supination Able to achieve slightly greater than neutral 48      (Blank rows = not tested)  HAND FUNCTION: Fingers extended w/ MPJs  flexed to 60 degrees. Pt able to achieve loose grasp w/ approx 90% of finger flexion, but unable to release/extend fingers back to starting position  ------------------------------------------------------------------------------------------------------------------------------------------------------  TODAY'S TREATMENT - 04/01/22:  Orthosis Management Fabricated custom hand-based anti-spasticity cone orthosis using 1/8" solid material to improve hand positioning and prevent further deformity and inhibit flexor synergy. Pt positioned w/ fingers flexed thumb flexed around cone and maximum palmar contact; smaller end of cone positioned radially and larger end ulnarly. One strap MCPJs to secure orthosis in place. Pt confirmed a comfortable fit; no observable areas of pressure. Pt instructed to wear orthosis when able during the day and educated on calling/returning ASAP if it is causing any irritation or is not achieving desired function. Orthosis will be monitored and adjusted in upcoming sessions prn.  PROM OT performed PROM of R forearm sup and wrist ext w/ fingers flexed, incorporating low load, long duration stretch held at end range to increase ROM for potential increased functional use of RUE at gross assist level. OT also facilitated self-stretching, provided corresponding education on importance of maintaining hold of stretch for about 20 sec vs "bouncing" stretch or increased reps of PROM when completing at home. Pt tolerated w/out discomfort w/ PROM limited by increased tone. Corresponding handouts administered to pt.    PATIENT EDUCATION: Ongoing condition-specific education related to therapeutic interventions completed this session and importance of completing stretching exercises at home for spasticity management Person educated: Patient and Spouse Education method: Explanation and Demonstration Education comprehension: verbalized understanding   HOME EXERCISE PROGRAM: Access Code:  [email or web address] - Supine Shoulder External Rotation AAROM with Dowel  - x2/day, 2x10, 5 sec hold - Forearm Supination PROM  - x2/day, 2x10, 20 sec hold - Seated AAROM Wrist Flexion/Extension with Clasped Hands  - x2/day, 2x10, 20 sec hold   GOALS: Goals reviewed with patient? Yes  SHORT TERM GOALS: Target date: 03/25/22  STG  Status:  1 Pt will demonstrate independence w/ HEP for RUE ROM Baseline: has prior program; to be assessed/updated Met - 03/08/22  2 Pt to demonstrate use R hand as stabalizer during simple bilateral task for improved participation in light IADLs Baseline: inconsistent use of RUE during functional tasks Progressing  3 Pt to demonstrate understanding of AE recommended for improved independence w/ ADLs (long-handle sponge, rocker knife/adaptive cutting board, reacher) Baseline: Requires assist to wash back, cut tough foods, and pull up pants Progressing     LONG TERM GOALS: Target date: 04/29/22  LTG  Status:  1 Pt to improve FOTO functional status to at least 45 to indicate decreased functional impact of RUE hemiparesis Baseline: Intake score: 40 Progressing  2 Pt will demonstrate ability to pull up pants over hips at Mod Ind, incorporating RUE/R hand at gross assist level and/or use of AE prn Baseline: Min A Progressing  3 Pt will be able to achieve finger extension ~50% of the time for improved  gross grasp and release of R hand Baseline: able to achieve gross grasp; unable to release Progressing    ASSESSMENT:  CLINICAL IMPRESSION: Pt returns to OP OT since previous session 3 weeks ago and 1 week s/p LUE botox injections. AROM measurements taken at start of session w/ pt demonstrating improved elbow and wrist ROM, though increased tone and spasticity continue to be limiting to functional use of RUE. This session, to assist w/ maximizing benefit of therapy at this time, OT fabricated hard anti-spasticity cone orthosis. Pt instructed to wear administered orthosis for a few  hours on and off during the day to inhibit flexor synergy and improve hand positioning. Orthosis fit well at conclusion of session w/ corresponding education provided and pt questions answered prn.  PERFORMANCE DEFICITS in functional skills including coordination, dexterity, sensation, tone, ROM, strength, muscle spasms, FMC, GMC, mobility, balance, body mechanics, and UE functional use.   IMPAIRMENTS are limiting patient from ADLs, IADLs, work, leisure, and social participation.   COMORBIDITIES may have co-morbidities  that affects occupational performance. Patient will benefit from skilled OT to address above impairments and improve overall function.   PLAN: OT FREQUENCY: 1x/week  OT DURATION: 10 weeks  PLANNED INTERVENTIONS: self care/ADL training, therapeutic exercise, therapeutic activity, neuromuscular re-education, manual therapy, passive range of motion, functional mobility training, aquatic therapy, splinting, electrical stimulation, ultrasound, fluidotherapy, moist heat, cryotherapy, patient/family education, and DME and/or AE instructions  RECOMMENDED OTHER SERVICES: None  CONSULTED AND AGREED WITH PLAN OF CARE: Patient and family member/caregiver  PLAN FOR NEXT SESSION: Review fit of orthosis and PROM and self-PROM exercises for R shoulder, elbow, wrist, and hand   Kathrine Cords, MSOT, OTR/L 04/01/2022, 9:37 AM

## 2022-04-05 ENCOUNTER — Encounter: Payer: Self-pay | Admitting: Occupational Therapy

## 2022-04-05 ENCOUNTER — Ambulatory Visit: Payer: Medicare Other | Admitting: Occupational Therapy

## 2022-04-05 DIAGNOSIS — M6281 Muscle weakness (generalized): Secondary | ICD-10-CM

## 2022-04-05 DIAGNOSIS — R2689 Other abnormalities of gait and mobility: Secondary | ICD-10-CM | POA: Diagnosis not present

## 2022-04-05 DIAGNOSIS — I69351 Hemiplegia and hemiparesis following cerebral infarction affecting right dominant side: Secondary | ICD-10-CM | POA: Diagnosis not present

## 2022-04-05 NOTE — Therapy (Signed)
OUTPATIENT OCCUPATIONAL THERAPY TREATMENT NOTE  Patient Name: Casey Petty MRN: [id number] DOB:06-Jul-1961, 61 y.o., male Today's Date: 04/05/2022  PCP: Charlott Rakes, MD REFERRING PROVIDER: Charlott Rakes, MD    OT End of Session - 04/05/22 0947     Visit Number 6    Number of Visits 10    Date for OT Re-Evaluation 04/29/22   extended to allow for interruption due to Botox injection   Authorization Type UHC MCR & MCD    Authorization Time Period VL: [place]    Progress Note Due on Visit 10    OT Start Time 0850    OT Stop Time 0932    OT Time Calculation (min) 42 min    Activity Tolerance Patient tolerated treatment well    Behavior During Therapy [facility] for tasks assessed/performed            Past Medical History:  Diagnosis Date   Diabetes mellitus without complication ([place])    on meds   Hyperlipidemia    on meds   Hypertension    on meds   Stroke ([place]) 2020   R hemiparesis-abnormal gait   Past Surgical History:  Procedure Laterality Date   WISDOM TOOTH EXTRACTION Bilateral 06/2021   Patient Active Problem List   Diagnosis Date Noted   Chronic right shoulder pain 06/22/2020   Adhesive capsulitis of right shoulder 10/07/2019   Abnormality of gait 09/17/2019   Spastic hemiparesis of right dominant side ([place]) 07/02/2019   History of CVA with residual deficit 06/09/2019   Spastic hemiparesis ([place])    Uncontrolled type 2 diabetes mellitus with hyperglycemia ([place])    Noncompliance    Benign essential HTN    Newly diagnosed diabetes ([place])    Leucocytosis    Right hemiparesis ([place])    Basal ganglia stroke ([place]) 03/04/2019   Stroke ([place]) 02/28/2019   Essential hypertension 02/28/2019   Tobacco abuse 02/28/2019   Hyperglycemia 02/28/2019    ONSET DATE: 02/28/2019  REFERRING DIAG: G81.10 (ICD-10-CM) - Spastic hemiparesis ([place])   THERAPY DIAG:  Spastic hemiparesis of right dominant side as late effect of cerebral infarction ([place])  Muscle weakness  (generalized)  Other abnormalities of gait and mobility  Rationale for Evaluation and Treatment Habilitation  SUBJECTIVE:   SUBJECTIVE STATEMENT: Pt reports the anti-spasticity splint feels good; no issues Pt accompanied by: self and significant other Andria Frames)  PAIN: Are you having pain? No  PERTINENT HISTORY: RUE spastic hemiparesis 2/2 L subcortical lacunar infarct in July 2020; HTN  PRECAUTIONS: Fall; no driving  PLOF:  Requires assistance w/ some BADLs; limited participation in IADLs; requires AD for functional mobility  PATIENT GOALS: "I don't know;" "open and close my hand"   OBJECTIVE:  ------------------------------------------------------------------------------------------------------------------------------------------------------ MEASUREMENTS BELOW TAKEN AT TIME OF EVALUATION UNLESS DESIGNATED OTHERWISE  HAND DOMINANCE: Right  UPPER EXTREMITY ROM:  Active ROM Right Eval - 7/14 Right 8/25  Shoulder flexion 118 deg w/ compensatory trunk movements 120  Shoulder abduction 98 83  Shoulder adduction    Shoulder extension    Shoulder internal rotation [facility]  Shoulder external rotation Unable unable  Elbow flexion 133 136  Elbow extension 35 25  Wrist flexion 19 30  Wrist extension 0 15  Wrist ulnar deviation    Wrist radial deviation    Wrist pronation    Wrist supination Unable       (Blank rows = not tested) *AROM of LUE WFL  Passive ROM Right Eval - 7/14 Right 8/25 Right 8/29  Shoulder flexion  Shoulder abduction     Shoulder adduction     Shoulder extension     Shoulder internal rotation     Shoulder external rotation     Elbow flexion 154 147   Elbow extension 0 19   Wrist flexion WFL    Wrist extension 14 29 37  Wrist ulnar deviation     Wrist radial deviation     Wrist pronation     Wrist supination slightly > neutral 48 49      (Blank rows = not tested)  HAND FUNCTION: Fingers extended w/ MPJs flexed to 60 degrees. Pt able to  achieve loose grasp w/ approx 90% of finger flexion, but unable to release/extend fingers back to starting position  ------------------------------------------------------------------------------------------------------------------------------------------------------  TODAY'S TREATMENT - 04/05/22:  ADLs Simulated self-feeding, scooping w/ spoon and cutting w/ a knife and fork to assess participation and positioning during activity. OT trialed built-up handle to assist w/ stabilizing fork while cutting putty w/ knife in L hand to good results.  Education provided on benefit of long-handle sponge during bathing; practiced use of long-handle sponge and grasp on washcloth in R hand to wash LUE w/ pt able to return demonstration of each w/out difficulty  [employer] activity to facilitate use of RUE at gross assist/stabilization level during bilateral tasks; pt able to complete activity w/ extended time and occ verbal cues to problem-solve most effective way to integrate RUE. Demonstrated good lateral pinch on PVC pieces, completing activity w/ overall Mod Ind  PROM OT performed PROM of R forearm sup, wrist ext w/ fingers flexed, and composite wrist and hand ext to neutral; incorporated low load, long duration stretch at end range to increase ROM for potential increased functional use of RUE at gross assist level. Also facilitated self-stretching of wrist and hand ext w/ pt able to return demonstration w/ good technique. Pt tolerated stretching w/out discomfort; PROM limited by increased tone, but improved from prior sessions. Corresponding handouts administered to pt.    PATIENT EDUCATION: Ongoing condition-specific education related to therapeutic interventions completed this session and importance of completing stretching exercises at home for spasticity management Person educated: Patient and Spouse Education method: Explanation and Demonstration Education comprehension: verbalized  understanding   HOME EXERCISE PROGRAM: Access Code: [email or web address] - Supine Shoulder External Rotation AAROM with Dowel  - x2/day, 2x10, 5 sec hold - Forearm Supination PROM  - x2/day, 2x10, 20 sec hold - Seated AAROM Wrist Flexion/Extension with Clasped Hands  - x2/day, 2x10, 20 sec hold   GOALS: Goals reviewed with patient? Yes  SHORT TERM GOALS: Target date: 03/25/22  STG  Status:  1 Pt will demonstrate independence w/ HEP for RUE ROM Baseline: has prior program; to be assessed/updated Met - 03/08/22  2 Pt to demonstrate use R hand as stabalizer during simple bilateral task for improved participation in light IADLs Baseline: inconsistent use of RUE during functional tasks Met - 04/05/22  3 Pt to demonstrate understanding of AE recommended for improved independence w/ ADLs (long-handle sponge, rocker knife/adaptive cutting board, reacher) Baseline: Requires assist to wash back, cut tough foods, and pull up pants Met - 04/05/22     LONG TERM GOALS: Target date: 04/29/22  LTG  Status:  1 Pt to improve FOTO functional status to at least 45 to indicate decreased functional impact of RUE hemiparesis Baseline: Intake score: 40 Progressing  2 Pt will demonstrate ability to pull up pants over hips at Mod Ind, incorporating RUE/R hand at gross assist  level and/or use of AE prn Baseline: Min A Met - 04/05/22: per pt report  3 Pt will be able to achieve finger extension ~50% of the time for improved gross grasp and release of R hand Baseline: able to achieve gross grasp; unable to release Progressing    ASSESSMENT:  CLINICAL IMPRESSION: Session today addressed current level of participation in functional activities, encouraging increased use of RUE at gross assist level and reviewing potentially beneficial compensatory strategies and/or AE prn. Pt able to incorporate RUE into all activities w/ minimal difficulty, demonstrating good technique and requiring cues only for occ problem-solving. OT then  continued to target PROM and stretching, able to achieve improved wrist extension and hand/finger extension compared to measurements taken last week, which suggests benefit of botox injections received on 03/25/22. Will continue to focus on stretching and HEP compliance to maximize benefit of injections and functional use of RUE at gross assist/non-dominant level.  PERFORMANCE DEFICITS in functional skills including coordination, dexterity, sensation, tone, ROM, strength, muscle spasms, FMC, GMC, mobility, balance, body mechanics, and UE functional use.   IMPAIRMENTS are limiting patient from ADLs, IADLs, work, leisure, and social participation.   COMORBIDITIES may have co-morbidities  that affects occupational performance. Patient will benefit from skilled OT to address above impairments and improve overall function.   PLAN: OT FREQUENCY: 1x/week  OT DURATION: 10 weeks  PLANNED INTERVENTIONS: self care/ADL training, therapeutic exercise, therapeutic activity, neuromuscular re-education, manual therapy, passive range of motion, functional mobility training, aquatic therapy, splinting, electrical stimulation, ultrasound, fluidotherapy, moist heat, cryotherapy, patient/family education, and DME and/or AE instructions  RECOMMENDED OTHER SERVICES: None  CONSULTED AND AGREED WITH PLAN OF CARE: Patient and family member/caregiver  PLAN FOR NEXT SESSION: PROM and self-PROM exercises for R shoulder, elbow, wrist, and hand   Kathrine Cords, MSOT, OTR/L 04/05/2022, 11:58 AM

## 2022-04-06 ENCOUNTER — Other Ambulatory Visit: Payer: Self-pay | Admitting: Family Medicine

## 2022-04-06 DIAGNOSIS — I152 Hypertension secondary to endocrine disorders: Secondary | ICD-10-CM

## 2022-04-12 ENCOUNTER — Ambulatory Visit: Payer: Medicare Other | Attending: Family Medicine | Admitting: Occupational Therapy

## 2022-04-12 ENCOUNTER — Encounter: Payer: Self-pay | Admitting: Occupational Therapy

## 2022-04-12 DIAGNOSIS — M6281 Muscle weakness (generalized): Secondary | ICD-10-CM | POA: Insufficient documentation

## 2022-04-12 DIAGNOSIS — I69351 Hemiplegia and hemiparesis following cerebral infarction affecting right dominant side: Secondary | ICD-10-CM | POA: Insufficient documentation

## 2022-04-12 DIAGNOSIS — R2689 Other abnormalities of gait and mobility: Secondary | ICD-10-CM | POA: Diagnosis not present

## 2022-04-12 NOTE — Therapy (Signed)
OUTPATIENT OCCUPATIONAL THERAPY TREATMENT NOTE  Patient Name: Casey Petty MRN: [id number] DOB:April 17, 1961, 61 y.o., male Today's Date: 04/12/2022  PCP: Charlott Rakes, MD REFERRING PROVIDER: Charlott Rakes, MD    OT End of Session - 04/12/22 0848     Visit Number 7    Number of Visits 10    Date for OT Re-Evaluation 04/29/22   extended to allow for interruption due to Botox injection   Authorization Type UHC MCR & MCD    Authorization Time Period VL: [place]    Progress Note Due on Visit 10    OT Start Time 0845    OT Stop Time 0925    OT Time Calculation (min) 40 min    Activity Tolerance Patient tolerated treatment well    Behavior During Therapy [facility] for tasks assessed/performed            Past Medical History:  Diagnosis Date   Diabetes mellitus without complication ([place])    on meds   Hyperlipidemia    on meds   Hypertension    on meds   Stroke ([place]) 2020   R hemiparesis-abnormal gait   Past Surgical History:  Procedure Laterality Date   WISDOM TOOTH EXTRACTION Bilateral 06/2021   Patient Active Problem List   Diagnosis Date Noted   Chronic right shoulder pain 06/22/2020   Adhesive capsulitis of right shoulder 10/07/2019   Abnormality of gait 09/17/2019   Spastic hemiparesis of right dominant side ([place]) 07/02/2019   History of CVA with residual deficit 06/09/2019   Spastic hemiparesis ([place])    Uncontrolled type 2 diabetes mellitus with hyperglycemia ([place])    Noncompliance    Benign essential HTN    Newly diagnosed diabetes ([place])    Leucocytosis    Right hemiparesis ([place])    Basal ganglia stroke ([place]) 03/04/2019   Stroke ([place]) 02/28/2019   Essential hypertension 02/28/2019   Tobacco abuse 02/28/2019   Hyperglycemia 02/28/2019    ONSET DATE: 02/28/2019  REFERRING DIAG: G81.10 (ICD-10-CM) - Spastic hemiparesis (HCC)   THERAPY DIAG:  Spastic hemiparesis of right dominant side as late effect of cerebral infarction (HCC)  Muscle weakness  (generalized)  Other abnormalities of gait and mobility  Rationale for Evaluation and Treatment Habilitation  SUBJECTIVE:   SUBJECTIVE STATEMENT: "It's been acting up...a good way" Pt accompanied by: self and significant other Andria Frames)  PAIN: Are you having pain? No  PERTINENT HISTORY: RUE spastic hemiparesis 2/2 L subcortical lacunar infarct in July 2020; HTN  PRECAUTIONS: Fall; no driving  PLOF:  Requires assistance w/ some BADLs; limited participation in IADLs; requires AD for functional mobility  PATIENT GOALS: "I don't know;" "open and close my hand"   OBJECTIVE:  ------------------------------------------------------------------------------------------------------------------------------------------------------ MEASUREMENTS BELOW TAKEN AT TIME OF EVALUATION UNLESS DESIGNATED OTHERWISE  HAND DOMINANCE: Right  UPPER EXTREMITY ROM:  Active ROM Right Eval - 7/14 Right 8/25  Shoulder flexion 118 deg w/ compensatory movements 120  Shoulder abduction 98 83  Shoulder adduction    Shoulder extension    Shoulder internal rotation [facility]  Shoulder external rotation Unable unable  Elbow flexion 133 136  Elbow extension 35 25  Wrist flexion 19 30  Wrist extension 0 15  Wrist ulnar deviation    Wrist radial deviation    Wrist pronation    Wrist supination Unable       (Blank rows = not tested) *AROM of LUE WFL  Passive ROM Right Eval - 7/14 Right 8/25 Right 8/29  Shoulder flexion  Shoulder abduction     Shoulder adduction     Shoulder extension     Shoulder internal rotation     Shoulder external rotation     Elbow flexion 154 147   Elbow extension 0 19   Wrist flexion WFL    Wrist extension 14 29 37  Wrist ulnar deviation     Wrist radial deviation     Wrist pronation     Wrist supination slightly > neutral 48 49      (Blank rows = not tested)  HAND FUNCTION: Fingers extended w/ MPJs flexed to 60 degrees. Pt able to achieve loose grasp w/ approx  90% of finger flexion, but unable to release/extend fingers back to starting position  ------------------------------------------------------------------------------------------------------------------------------------------------------  TODAY'S TREATMENT  04/12/22: Closed-Chain Closed-chain AAROM of BUE shoulder flex 2x15 w/ unweighted dowel while seated; occ cues to decrease compensatory trunk movements  AAROM of shoulder ER x15 w/ cane; most success holding cane handle w/ forearm in neutral and mod cues at elbow to prevent compensatory abduct.  AAROM of elbow ext 2x15 w/ cane while seated. OT provided occ cues/facilitation to decrease compensatory movements and incr arc of motion each rep  Grasp and Release R hand grasp and release to stack therapy cones, incorporating functional reach with OT holding stack to facilitate elbow ext each rep. Emphasis on [place] w/ composite movements as able  Picking up 1" blocks w/ R hand, able to demonstrate pad-to-pad prehension w/ thumb to index finger, OT again incorporated functional reach by moving container further away to facilitate elbow ext as able    PATIENT EDUCATION: Ongoing condition-specific education related to therapeutic interventions completed this session Person educated: Patient and Spouse Education method: Explanation and Demonstration Education comprehension: verbalized understanding   HOME EXERCISE PROGRAM: Access Code: [email or web address] URL: [email or web address]  Exercises - Supine Shoulder Flexion Extension AAROM with Dowel  - 2 x daily - 2 sets - 15 reps - Supine Shoulder External Rotation AAROM with Dowel  - 2 x daily - 2 sets - 10 reps - 5 sec hold - Seated Shoulder External Rotation AAROM with Cane and Hand in Neutral  - 2 x daily - 2 sets - 10 reps - Forearm Supination Stretch  - 3 x daily - 1 sets - 5-10 reps - 10 sec hold - Seated AAROM Wrist Flexion/Extension with Clasped Hands  - 3 x daily - 1 sets - 5-10 reps  - 10 sec hold - Seated Wrist Extension PROM  - 3 x daily - 1 sets - 5-10 reps - 10 sec hold   GOALS: Goals reviewed with patient? Yes  SHORT TERM GOALS: Target date: 03/25/22  STG  Status:  1 Pt will demonstrate independence w/ HEP for RUE ROM Baseline: has prior program; to be assessed/updated Met - 03/08/22  2 Pt to demonstrate use R hand as stabalizer during simple bilateral task for improved participation in light IADLs Baseline: inconsistent use of RUE during functional tasks Met - 04/05/22  3 Pt to demonstrate understanding of AE recommended for improved independence w/ ADLs (long-handle sponge, rocker knife/adaptive cutting board, reacher) Baseline: Requires assist to wash back, cut tough foods, and pull up pants Met - 04/05/22     LONG TERM GOALS: Target date: 04/29/22  LTG  Status:  1 Pt to improve FOTO functional status to at least 45 to indicate decreased functional impact of RUE hemiparesis Baseline: Intake score: 40 Progressing  2 Pt will demonstrate ability to pull up pants over hips  at [employer], incorporating RUE/R hand at gross assist level and/or use of AE prn Baseline: Min A Met - 04/05/22: per pt report  3 Pt will be able to achieve finger extension ~50% of the time for improved gross grasp and release of R hand Baseline: able to achieve gross grasp; unable to release Progressing    ASSESSMENT:  CLINICAL IMPRESSION: Pt continues to progress slowly toward goals. Session today focused on HEP review and updating prn for self-stretching and AAROM of RUE to facilitate consistency w/ spasticity management for improved functional use of RUE after d/c from skilled OT services. Will continue to focus on stretching and HEP compliance to maximize benefit of injections and functional use of RUE at gross assist/non-dominant level.  PERFORMANCE DEFICITS in functional skills including coordination, dexterity, sensation, tone, ROM, strength, muscle spasms, FMC, GMC, mobility, balance,  body mechanics, and UE functional use.   IMPAIRMENTS are limiting patient from ADLs, IADLs, work, leisure, and social participation.   COMORBIDITIES may have co-morbidities  that affects occupational performance. Patient will benefit from skilled OT to address above impairments and improve overall function.   PLAN: OT FREQUENCY: 1x/week  OT DURATION: 10 weeks  PLANNED INTERVENTIONS: self care/ADL training, therapeutic exercise, therapeutic activity, neuromuscular re-education, manual therapy, passive range of motion, functional mobility training, aquatic therapy, splinting, electrical stimulation, ultrasound, fluidotherapy, moist heat, cryotherapy, patient/family education, and DME and/or AE instructions  RECOMMENDED OTHER SERVICES: None  CONSULTED AND AGREED WITH PLAN OF CARE: Patient and family member/caregiver  PLAN FOR NEXT SESSION: Discuss potential for upcoming d/c on 9/19; continue w/ RUE PROM/AAROM; gross grasp and release focusing on finger extension   Kathrine Cords, MSOT, OTR/L 04/12/2022, 9:54 AM

## 2022-04-19 ENCOUNTER — Other Ambulatory Visit: Payer: Self-pay | Admitting: Family Medicine

## 2022-04-19 ENCOUNTER — Ambulatory Visit: Payer: Medicare Other | Admitting: Occupational Therapy

## 2022-04-19 ENCOUNTER — Encounter: Payer: Self-pay | Admitting: Occupational Therapy

## 2022-04-19 DIAGNOSIS — I69351 Hemiplegia and hemiparesis following cerebral infarction affecting right dominant side: Secondary | ICD-10-CM

## 2022-04-19 DIAGNOSIS — R2689 Other abnormalities of gait and mobility: Secondary | ICD-10-CM

## 2022-04-19 DIAGNOSIS — M6281 Muscle weakness (generalized): Secondary | ICD-10-CM

## 2022-04-19 NOTE — Therapy (Signed)
OUTPATIENT OCCUPATIONAL THERAPY TREATMENT NOTE  Patient Name: Casey Petty MRN: [id number] DOB:[birth year], 61 y.o., male Today's Date: 04/19/2022  PCP: Charlott Rakes, MD REFERRING PROVIDER: Charlott Rakes, MD    OT End of Session - 04/19/22 7619     Visit Number 8    Number of Visits 10    Date for OT Re-Evaluation 04/29/22   extended to allow for interruption due to Botox injection   Authorization Type UHC MCR & MCD    Authorization Time Period VL: [place]    Progress Note Due on Visit 10    OT Start Time 0828    OT Stop Time 0910    OT Time Calculation (min) 42 min    Activity Tolerance Patient tolerated treatment well    Behavior During Therapy [facility] for tasks assessed/performed            Past Medical History:  Diagnosis Date   Diabetes mellitus without complication ([place])    on meds   Hyperlipidemia    on meds   Hypertension    on meds   Stroke ([place]) 2020   R hemiparesis-abnormal gait   Past Surgical History:  Procedure Laterality Date   WISDOM TOOTH EXTRACTION Bilateral 06/2021   Patient Active Problem List   Diagnosis Date Noted   Chronic right shoulder pain 06/22/2020   Adhesive capsulitis of right shoulder 10/07/2019   Abnormality of gait 09/17/2019   Spastic hemiparesis of right dominant side ([place]) 07/02/2019   History of CVA with residual deficit 06/09/2019   Spastic hemiparesis ([place])    Uncontrolled type 2 diabetes mellitus with hyperglycemia ([place])    Noncompliance    Benign essential HTN    Newly diagnosed diabetes ([place])    Leucocytosis    Right hemiparesis ([place])    Basal ganglia stroke ([place]) 03/04/2019   Stroke ([place]) 02/28/2019   Essential hypertension 02/28/2019   Tobacco abuse 02/28/2019   Hyperglycemia 02/28/2019    ONSET DATE: 02/28/2019  REFERRING DIAG: G81.10 (ICD-10-CM) - Spastic hemiparesis (HCC)   THERAPY DIAG:  Spastic hemiparesis of right dominant side as late effect of cerebral infarction ([place])  Muscle weakness  (generalized)  Other abnormalities of gait and mobility  Rationale for Evaluation and Treatment Habilitation  SUBJECTIVE:   SUBJECTIVE STATEMENT: Pt reports he is comfortable w/ discharge after next week, at least until he gets another round of botox injections Pt accompanied by: self and significant other Andria Frames)  PAIN: Are you having pain? No  PERTINENT HISTORY: RUE spastic hemiparesis 2/2 L subcortical lacunar infarct in July 2020; HTN  PRECAUTIONS: Fall; no driving  PLOF:  Requires assistance w/ some BADLs; limited participation in IADLs; requires AD for functional mobility  PATIENT GOALS: "I don't know;" "open and close my hand"   OBJECTIVE:  ------------------------------------------------------------------------------------------------------------------------------------------------------ MEASUREMENTS BELOW TAKEN AT TIME OF EVALUATION UNLESS DESIGNATED OTHERWISE  HAND DOMINANCE: Right  UPPER EXTREMITY ROM:  Active ROM Right Eval - 7/14 Right 8/25  Shoulder flexion 118 deg w/ compensatory movements 120  Shoulder abduction 98 83  Shoulder adduction    Shoulder extension    Shoulder internal rotation [facility]  Shoulder external rotation Unable unable  Elbow flexion 133 136  Elbow extension 35 25  Wrist flexion 19 30  Wrist extension 0 15  Wrist ulnar deviation    Wrist radial deviation    Wrist pronation    Wrist supination Unable       (Blank rows = not tested) *AROM of LUE WFL  Passive ROM Right  Eval - 7/14 Right 8/25 Right 8/29  Shoulder flexion     Shoulder abduction     Shoulder adduction     Shoulder extension     Shoulder internal rotation     Shoulder external rotation     Elbow flexion 154 147   Elbow extension 0 19   Wrist flexion WFL    Wrist extension 14 29 37  Wrist ulnar deviation     Wrist radial deviation     Wrist pronation     Wrist supination slightly > neutral 48 49      (Blank rows = not tested)  HAND FUNCTION: Fingers  extended w/ MPJs flexed to 60 degrees. Pt able to achieve loose grasp w/ approx 90% of finger flexion, but unable to release/extend fingers back to starting position  ------------------------------------------------------------------------------------------------------------------------------------------------------  TODAY'S TREATMENT  04/19/22: Grasp and Release R hand grasp and release to stack therapy cones, incorporating functional reach with OT holding stack to facilitate elbow ext each rep. Emphasis on [facility] w/ composite movements as able.  Picking up small balls w/ R hand, focusing on full gross grasp, and placing on top of therapy cones. Difficulty achieving wrist extension during exercise.  Removing cylindrical graded pegs from pegboard using R hand gross grasp w/ mod difficulty; OT provided add'l cues to facilitate more effective movement patterns/decreased compensatory patterns w/ fair results.  Wrist Extension OT performed gentle PROM of wrist ext, forearm sup, and then finger ext, incorporating passive stretch held at end range to increase ROM and decrease joint stiffness for potential increase in motor function. Able to achieve good ROM in all planes. Pt tolerated exercise w/out discomfort; PROM limited by incr tone.    PATIENT EDUCATION: Ongoing condition-specific education related to therapeutic interventions completed this session Person educated: Patient and Spouse Education method: Explanation and Demonstration Education comprehension: verbalized understanding   HOME EXERCISE PROGRAM: Access Code: [email or web address] URL: [email or web address]  Exercises - Supine Shoulder Flexion Extension AAROM with Dowel  - 2 x daily - 2 sets - 15 reps - Supine Shoulder External Rotation AAROM with Dowel  - 2 x daily - 2 sets - 10 reps - 5 sec hold - Seated Shoulder External Rotation AAROM with Cane and Hand in Neutral  - 2 x daily - 2 sets - 10 reps - Forearm Supination Stretch  -  3 x daily - 1 sets - 5-10 reps - 10 sec hold - Seated AAROM Wrist Flexion/Extension with Clasped Hands  - 3 x daily - 1 sets - 5-10 reps - 10 sec hold - Seated Wrist Extension PROM  - 3 x daily - 1 sets - 5-10 reps - 10 sec hold   GOALS: Goals reviewed with patient? Yes  SHORT TERM GOALS: Target date: 03/25/22  STG  Status:  1 Pt will demonstrate independence w/ HEP for RUE ROM Baseline: has prior program; to be assessed/updated Met - 03/08/22  2 Pt to demonstrate use R hand as stabalizer during simple bilateral task for improved participation in light IADLs Baseline: inconsistent use of RUE during functional tasks Met - 04/05/22  3 Pt to demonstrate understanding of AE recommended for improved independence w/ ADLs (long-handle sponge, rocker knife/adaptive cutting board, reacher) Baseline: Requires assist to wash back, cut tough foods, and pull up pants Met - 04/05/22     LONG TERM GOALS: Target date: 04/29/22  LTG  Status:  1 Pt to improve FOTO functional status to at least 45 to indicate decreased functional impact of RUE  hemiparesis Baseline: Intake score: 40 Progressing - 04/19/22: 38  2 Pt will demonstrate ability to pull up pants over hips at Mod Ind, incorporating RUE/R hand at gross assist level and/or use of AE prn Baseline: Min A Met - 04/05/22: per pt report  3 Pt will be able to achieve finger extension ~50% of the time for improved gross grasp and release of R hand Baseline: able to achieve gross grasp; unable to release Met - 04/19/22: able to achieve finger extension consistently    ASSESSMENT:  CLINICAL IMPRESSION: Session today w/ continued focus on HEP review to facilitate consistency w/ spasticity management for improved functional use of RUE after d/c from skilled OT services, as well as addressing gross grasp and release. OT provided relevant education on benefit of incorporating RUE at gross assist level for increased participation and independence during ADLs. Will  continue to focus on stretching and HEP compliance to maximize benefit of injections and functional use of RUE at gross assist/non-dominant level in anticipation for d/c next session.  PERFORMANCE DEFICITS in functional skills including coordination, dexterity, sensation, tone, ROM, strength, muscle spasms, FMC, GMC, mobility, balance, body mechanics, and UE functional use.   IMPAIRMENTS are limiting patient from ADLs, IADLs, work, leisure, and social participation.   COMORBIDITIES may have co-morbidities  that affects occupational performance. Patient will benefit from skilled OT to address above impairments and improve overall function.   PLAN: OT FREQUENCY: 1x/week  OT DURATION: 10 weeks  PLANNED INTERVENTIONS: self care/ADL training, therapeutic exercise, therapeutic activity, neuromuscular re-education, manual therapy, passive range of motion, functional mobility training, aquatic therapy, splinting, electrical stimulation, ultrasound, fluidotherapy, moist heat, cryotherapy, patient/family education, and DME and/or AE instructions  RECOMMENDED OTHER SERVICES: None  CONSULTED AND AGREED WITH PLAN OF CARE: Patient and family member/caregiver  PLAN FOR NEXT SESSION: D/C next session   Kathrine Cords, MSOT, OTR/L 04/19/2022, 9:10 AM

## 2022-04-26 ENCOUNTER — Encounter: Payer: Self-pay | Admitting: Occupational Therapy

## 2022-04-26 ENCOUNTER — Ambulatory Visit: Payer: Medicare Other | Admitting: Occupational Therapy

## 2022-04-26 DIAGNOSIS — M6281 Muscle weakness (generalized): Secondary | ICD-10-CM | POA: Diagnosis not present

## 2022-04-26 DIAGNOSIS — R2689 Other abnormalities of gait and mobility: Secondary | ICD-10-CM

## 2022-04-26 DIAGNOSIS — I69351 Hemiplegia and hemiparesis following cerebral infarction affecting right dominant side: Secondary | ICD-10-CM | POA: Diagnosis not present

## 2022-04-26 NOTE — Therapy (Unsigned)
OUTPATIENT OCCUPATIONAL THERAPY TREATMENT NOTE & DISCHARGE SUMMARY  Patient Name: Casey Petty MRN: [id number] DOB:03-Sep-1960, 61 y.o., male Today's Date: 04/26/2022  PCP: Charlott Rakes, MD REFERRING PROVIDER: Charlott Rakes, MD    OT End of Session - 04/26/22 0850     Visit Number 9    Number of Visits 10    Date for OT Re-Evaluation 04/29/22   extended to allow for interruption due to Botox injection   Authorization Type UHC MCR & MCD    Authorization Time Period VL: [place]    Progress Note Due on Visit 10    OT Start Time 0848    OT Stop Time 0928    OT Time Calculation (min) 40 min    Activity Tolerance Patient tolerated treatment well    Behavior During Therapy WFL for tasks assessed/performed            OCCUPATIONAL THERAPY DISCHARGE SUMMARY  Visits from Start of Care: 9  Current functional level related to goals / functional outcomes: See goals below   Remaining deficits: R-sided hemiparesis w/ increased tone and spasticity limiting functional mobility, functional use of RUE, and independence w/ ADLs   Education / Equipment: Condition-specific education, HEP, compensatory strategies for ADLs   Patient agrees to discharge. Patient goals were partially met. Patient is being discharged due to maximized rehab potential.    Past Medical History:  Diagnosis Date   Diabetes mellitus without complication ([place])    on meds   Hyperlipidemia    on meds   Hypertension    on meds   Stroke [facility]) 2020   R hemiparesis-abnormal gait   Past Surgical History:  Procedure Laterality Date   WISDOM TOOTH EXTRACTION Bilateral 06/2021   Patient Active Problem List   Diagnosis Date Noted   Chronic right shoulder pain 06/22/2020   Adhesive capsulitis of right shoulder 10/07/2019   Abnormality of gait 09/17/2019   Spastic hemiparesis of right dominant side ([place]) 07/02/2019   History of CVA with residual deficit 06/09/2019   Spastic hemiparesis (HCC)    Uncontrolled type  2 diabetes mellitus with hyperglycemia (HCC)    Noncompliance    Benign essential HTN    Newly diagnosed diabetes ([place])    Leucocytosis    Right hemiparesis ([place])    Basal ganglia stroke ([place]) 03/04/2019   Stroke ([place]) 02/28/2019   Essential hypertension 02/28/2019   Tobacco abuse 02/28/2019   Hyperglycemia 02/28/2019    ONSET DATE: 02/28/2019  REFERRING DIAG: G81.10 (ICD-10-CM) - Spastic hemiparesis (HCC)   THERAPY DIAG:  Spastic hemiparesis of right dominant side as late effect of cerebral infarction (HCC)  Muscle weakness (generalized)  Other abnormalities of gait and mobility  Rationale for Evaluation and Treatment Habilitation  SUBJECTIVE:   SUBJECTIVE STATEMENT: "I want to take a break" Pt accompanied by: self  PAIN: Are you having pain? No  PERTINENT HISTORY: RUE spastic hemiparesis 2/2 L subcortical lacunar infarct in July 2020; HTN  PRECAUTIONS: Fall; no driving  PLOF:  Requires assistance w/ some BADLs; limited participation in IADLs; requires AD for functional mobility  PATIENT GOALS: "I don't know;" "open and close my hand"   OBJECTIVE:  ------------------------------------------------------------------------------------------------------------------------------------------------------ MEASUREMENTS BELOW TAKEN AT TIME OF EVALUATION UNLESS DESIGNATED OTHERWISE  HAND DOMINANCE: Right  UPPER EXTREMITY ROM:  Active ROM Right Eval - 7/14 Right 8/25 Right 9/19  Shoulder flexion 118 deg w/ compensatory movements 120 120  Shoulder abduction 98 83 84  Shoulder adduction     Shoulder extension  Shoulder internal rotation [facility]   Shoulder external rotation Unable unable 14  Elbow flexion 133 136 138  Elbow extension 35 25 22  Wrist flexion 19 30 32  Wrist extension 0 15 8  Wrist ulnar deviation     Wrist radial deviation     Wrist pronation     Wrist supination Unable  28 deg of pronation when attempting to supinate from full pronation       (Blank rows = not tested)  Passive ROM Right Eval - 7/14 Right 8/25 Right 8/29 Right 9/19  Shoulder flexion    141  Shoulder abduction    105  Shoulder external rotation    52  Elbow flexion 154 147    Elbow extension 0 19  8  Wrist flexion WFL     Wrist extension 14 29 37   Wrist ulnar deviation      Wrist radial deviation      Wrist pronation      Wrist supination slightly > neutral 48 49       (Blank rows = not tested) **Shoulder PROM tested in supine  HAND FUNCTION: Fingers extended w/ MPJs flexed to 60 degrees. Pt able to achieve loose grasp w/ approx 90% of finger flexion, but unable to release/extend fingers back to starting position  ------------------------------------------------------------------------------------------------------------------------------------------------------  TODAY'S TREATMENT  04/26/22: RUE Exercises OT performed gentle PROM of R shoulder flexion w/ pt in supine, incorporating stretch held at end range x10. Able to achieve ROM WFL.    Self-PROM R shoulder flexion w/ hands clasped w/ pt in supine; OT providing fading verbal/tactile/visual cues for technique and alignment. Completed 2x10 w/out difficulty or pain.  OT performed gentle PROM of R shoulder abduction w/ pt in supine, incorporating stretch held at end range x10. Able to achieve approx 90 deg w/out difficulty.  OT performed gentle PROM of R shoulder ER w/ pt in supine, incorporating stretch held at end range x10. Able to achieve about 50% of ROM. Towel roll positioned under UE.  AAROM of R shoulder ER w/ cane x10 w/ OT providing significant support of UE and verbal cues for positioning throughout. Incr success when attempted w/ arm abducted to about 60 deg.  OT performed gentle PROM of R elbow extension, incorporating stretch held at end range w/ OT providing upport at R shoulder; pt positioned in supine and tolerated w/out difficulty or discomfort    PATIENT EDUCATION: Reviewed full  HEP Person educated: Patient Education method: Explanation and Demonstration Education comprehension: verbalized understanding   HOME EXERCISE PROGRAM: Access Code: [email or web address] URL: [email or web address]  Exercises - Supine Shoulder Flexion Extension AAROM with Dowel  - 2 x daily - 2 sets - 15 reps - Supine Shoulder External Rotation AAROM with Dowel  - 2 x daily - 2 sets - 10 reps - 3 sec hold - Supine Shoulder External Rotation in 45 Degrees Abduction AAROM with Dowel  - 2 x daily - 2 sets - 10 reps - 3 sec hold - Forearm Supination Stretch  - 3 x daily - 1 sets - 5-10 reps - 10 sec hold - Seated AAROM Wrist Flexion/Extension with Clasped Hands  - 3 x daily - 1 sets - 5-10 reps - 10 sec hold - Seated Wrist Extension PROM  - 3 x daily - 1 sets - 5-10 reps - 10 sec hold   GOALS: Goals reviewed with patient? Yes  SHORT TERM GOALS: Target date: 03/25/22  STG  Status:  1 Pt will  demonstrate independence w/ HEP for RUE ROM Baseline: has prior program; to be assessed/updated Met - 03/08/22  2 Pt to demonstrate use R hand as stabalizer during simple bilateral task for improved participation in light IADLs Baseline: inconsistent use of RUE during functional tasks Met - 04/05/22  3 Pt to demonstrate understanding of AE recommended for improved independence w/ ADLs (long-handle sponge, rocker knife/adaptive cutting board, reacher) Baseline: Requires assist to wash back, cut tough foods, and pull up pants Met - 04/05/22     LONG TERM GOALS: Target date: 04/29/22  LTG  Status:  1 Pt to improve FOTO functional status to at least 45 to indicate decreased functional impact of RUE hemiparesis Baseline: Intake score: 40 Not met - 04/19/22: 38  2 Pt will demonstrate ability to pull up pants over hips at Mod Ind, incorporating RUE/R hand at gross assist level and/or use of AE prn Baseline: Min A Met - 04/05/22: per pt report  3 Pt will be able to achieve finger extension ~50% of the time  for improved gross grasp and release of R hand Baseline: able to achieve gross grasp; unable to release Met - 04/19/22: able to achieve finger extension consistently    ASSESSMENT:  CLINICAL IMPRESSION: Mr. Okey is a 61 y/o who has been seen in OP OT for chronic RUE hemiparesis s/p L subcortical lacunar infarct in July 2020. Pt has shown improvements in progress toward goals w/ STGs and LTGs met. Pt received botox injections about 1 month ago on 03/25/22 and has made notable improvement in ROM and use of RUE. Session today focused on review of HEP in anticipation for d/c and continued education regarding importance of maintaining HEP for continued spasticity management. OT incorporated passive stretching held at end range of RUE exercises completed today to increase ROM and decrease joint stiffness for potential increase in motor function w/ pt able to tolerate exercise and return demonstration of HEP exercises prn w/out difficulty. Pt is appropriate for d/c from skilled OT to HEP at this time, reports he is satisfied with progress, and is currently agreeable to discharge plan.    PLAN: OT FREQUENCY: 1x/week  OT DURATION: 10 weeks  PLANNED INTERVENTIONS: self care/ADL training, therapeutic exercise, therapeutic activity, neuromuscular re-education, manual therapy, passive range of motion, functional mobility training, aquatic therapy, splinting, electrical stimulation, ultrasound, fluidotherapy, moist heat, cryotherapy, patient/family education, and DME and/or AE instructions  RECOMMENDED OTHER SERVICES: None  CONSULTED AND AGREED WITH PLAN OF CARE: Patient and family member/caregiver  PLAN FOR NEXT SESSION: D/C   Kathrine Cords, MSOT, OTR/L 04/26/2022, 9:29 AM

## 2022-05-17 ENCOUNTER — Encounter: Payer: Self-pay | Admitting: Physical Medicine & Rehabilitation

## 2022-05-17 ENCOUNTER — Encounter: Payer: Medicare Other | Attending: Registered Nurse | Admitting: Physical Medicine & Rehabilitation

## 2022-05-17 VITALS — BP 121/77 | HR 74 | Ht 67.0 in | Wt 148.4 lb

## 2022-05-17 DIAGNOSIS — G8111 Spastic hemiplegia affecting right dominant side: Secondary | ICD-10-CM | POA: Diagnosis not present

## 2022-05-17 NOTE — Progress Notes (Signed)
Subjective:    Patient ID: Casey Petty, male    DOB: [birth year], 61 y.o.   MRN: [id number]  HPI 61 year old male with right spastic hemiplegia due to left CVA.  He has had only partial response to PT OT.  He is on baclofen 30 mg 3 times a day with only partial improvement Using Right WHO and R AFO, wife helps with donning   On 03/25/2022 pt had Dysport injection and felt his RUE and RLE spasticity improved to allow him freer ease of movement .  THe pt has completed outpt OT but would like OP PT  as well .  Pt amb without device  RIGHT    Biceps:            100                                     Brachioradialis 100                                     FCR                 200                                     PT                   200                                     FDS                 200                                     FDP                 100                                       Right    Soleus 100 FDL 100U Hamstrings 400U Pain Inventory Average Pain 0 Pain Right Now 0 My pain is  no  In the last 24 hours, has pain interfered with the following? General activity 0 Relation with others 0 Enjoyment of life 0 What TIME of day is your pain at its worst?  Sleep (in general) NA  Pain is worse with:  no pain Pain improves with:  no pain Relief from Meds:  no pain  Family History  Problem Relation Age of Onset   Heart attack Father    Cancer Neg Hx    Colon polyps Neg Hx    Colon cancer Neg Hx    Esophageal cancer Neg Hx    Stomach cancer Neg Hx    Rectal cancer Neg Hx    Social History   Socioeconomic History   Marital status: Single    Spouse name: Not on file   Number of children: Not on file   Years of education: Not on file  Highest education level: Not on file  Occupational History   Not on file  Tobacco Use   Smoking status: Every Day    Packs/day: 0.25    Types: Cigarettes   Smokeless tobacco: Never   Tobacco comments:    Smoking 4 cigs/day   Vaping Use   Vaping Use: Never used  Substance and Sexual Activity   Alcohol use: Not Currently   Drug use: No   Sexual activity: Never  Other Topics Concern   Not on file  Social History Narrative   Not on file   Social Determinants of Health   Financial Resource Strain: Low Risk  (01/27/2022)   Overall Financial Resource Strain (CARDIA)    Difficulty of Paying Living Expenses: Not very hard  Food Insecurity: No Food Insecurity (01/27/2022)   Hunger Vital Sign    Worried About Running Out of Food in the Last Year: Never true    Ran Out of Food in the Last Year: Never true  Transportation Needs: No Transportation Needs (01/27/2022)   PRAPARE - Hydrologist (Medical): No    Lack of Transportation (Non-Medical): No  Physical Activity: Sufficiently Active (01/27/2022)   Exercise Vital Sign    Days of Exercise per Week: 3 days    Minutes of Exercise per Session: 60 min  Stress: No Stress Concern Present (01/27/2022)   Freedom    Feeling of Stress : Not at all  Social Connections: Moderately Isolated (05/31/2021)   Social Connection and Isolation Panel [NHANES]    Frequency of Communication with Friends and Family: More than three times a week    Frequency of Social Gatherings with Friends and Family: More than three times a week    Attends Religious Services: Never    Marine scientist or Organizations: No    Attends Music therapist: Never    Marital Status: Living with partner   Past Surgical History:  Procedure Laterality Date   WISDOM TOOTH EXTRACTION Bilateral 06/2021   Past Surgical History:  Procedure Laterality Date   WISDOM TOOTH EXTRACTION Bilateral 06/2021   Past Medical History:  Diagnosis Date   Diabetes mellitus without complication ([place])    on meds   Hyperlipidemia    on meds   Hypertension    on meds   Stroke ([place]) 2020   R  hemiparesis-abnormal gait   BP 121/77   Pulse 74   Ht '5\' 7"'$  (1.702 m)   Wt 148 lb 6.4 oz (67.3 kg)   SpO2 97%   BMI 23.24 kg/m   Opioid Risk Score:   Fall Risk Score:  `1  Depression screen [facility] 2/9     03/25/2022    9:06 AM 02/04/2022   10:35 AM 01/19/2022    9:27 AM 12/23/2021    9:21 AM 11/09/2021    9:47 AM 09/23/2021   10:00 AM 09/13/2021   11:28 AM  Depression screen PHQ 2/9  Decreased Interest 0 0 1 0 0 0 0  Down, Depressed, Hopeless 0 0 0 0 0 0 0  PHQ - 2 Score 0 0 1 0 0 0 0  Altered sleeping   0    0  Tired, decreased energy   0    0  Change in appetite   0    0  Feeling bad or failure about yourself    0    0  Trouble concentrating  0    0  Moving slowly or fidgety/restless   0    0  Suicidal thoughts   0    0  PHQ-9 Score   1    0     Review of Systems  Constitutional: Negative.   HENT: Negative.    Eyes: Negative.   Respiratory: Negative.    Cardiovascular: Negative.   Gastrointestinal: Negative.   Endocrine: Negative.   Genitourinary: Negative.   Musculoskeletal:  Positive for gait problem.  Skin: Negative.   Allergic/Immunologic: Negative.   Hematological: Negative.   Psychiatric/Behavioral: Negative.    All other systems reviewed and are negative.      Objective:   Physical Exam  MAS 2 in R elbow wrist and finger flexors MAS 1 in RIght hamstrings, no clonus at Right ankle  RUE 3- delt , bi, tri , grip 4/5 RIght HF, KE 2- ADF      Assessment & Plan:    RIght spastic hemi due to remote CVA improved spasticity with dysport injeciton ~7wks ago, will repeat next month at 12 wk mark same muscle groups and dosing

## 2022-05-17 NOTE — Patient Instructions (Signed)
Will order PT after next injection

## 2022-06-02 ENCOUNTER — Other Ambulatory Visit: Payer: Self-pay | Admitting: Family Medicine

## 2022-06-02 DIAGNOSIS — E1169 Type 2 diabetes mellitus with other specified complication: Secondary | ICD-10-CM

## 2022-06-02 MED ORDER — DEXCOM G6 TRANSMITTER MISC: 3 refills | Status: AC

## 2022-06-02 MED ORDER — DEXCOM G6 RECEIVER DEVI: 0 refills | Status: AC

## 2022-06-02 MED ORDER — DEXCOM G6 SENSOR MISC: 3 refills | Status: AC

## 2022-06-02 NOTE — Telephone Encounter (Signed)
Medication Refill - Medication:  Continuous Blood Gluc Transmit (DEXCOM G6 TRANSMITTER) MISC/ Continuous Blood Gluc Sensor (DEXCOM G6 SENSOR) MISC / Continuous Blood Gluc Receiver ([place]) DEVI   Has the patient contacted their pharmacy? yes (Agent: If no, request that the patient contact the pharmacy for the refill. If patient does not wish to contact the pharmacy document the reason why and proceed with request.) (Agent: If yes, when and what did the pharmacy advise?)contact pcp  Preferred Pharmacy (with phone number or street name):  [facility], [place], [place] [address] [phone] Has the patient been seen for an appointment in the last year OR does the patient have an upcoming appointment? yes  Agent: Please be advised that RX refills may take up to 3 business days. We ask that you follow-up with your pharmacy.

## 2022-06-02 NOTE — Telephone Encounter (Signed)
Requested Prescriptions  Pending Prescriptions Disp Refills  . Continuous Blood Gluc Receiver (DEXCOM G6 RECEIVER) DEVI 1 each 0    Sig: Use to check blood sugar TID.     Endocrinology: Diabetes - Testing Supplies Passed - 06/02/2022  1:25 PM      Passed - Valid encounter within last 12 months    Recent Outpatient Visits          4 months ago Type 2 diabetes mellitus with other specified complication, with long-term current use of insulin ([place])   [place], [place], MD   8 months ago Type 2 diabetes mellitus with other specified complication, with long-term current use of insulin ([place])   [place], [place], MD   11 months ago Type 2 diabetes mellitus with other specified complication, with long-term current use of insulin ([place])   [place], [place], MD   1 year ago Spastic hemiparesis [facility])   [place], Charlane Ferretti, MD   1 year ago Uncontrolled type 2 diabetes mellitus with hyperglycemia [facility])   [place], MD      Future Appointments            In 1 month Charlott Rakes, MD [place]           . Continuous Blood Gluc Sensor (DEXCOM G6 SENSOR) MISC 3 each 3    Sig: Use to check blood sugar TID.     Endocrinology: Diabetes - Testing Supplies Passed - 06/02/2022  1:25 PM      Passed - Valid encounter within last 12 months    Recent Outpatient Visits          4 months ago Type 2 diabetes mellitus with other specified complication, with long-term current use of insulin ([place])   [place], [place], MD   8 months ago Type 2 diabetes mellitus with other specified complication, with long-term current use of insulin ([place])   [place], [place], MD   11 months ago Type 2 diabetes  mellitus with other specified complication, with long-term current use of insulin ([place])   [place], Enobong, MD   1 year ago Spastic hemiparesis [facility])   [place], Charlane Ferretti, MD   1 year ago Uncontrolled type 2 diabetes mellitus with hyperglycemia [facility])   [place], MD      Future Appointments            In 1 month Charlott Rakes, MD [place]           . Continuous Blood Gluc Transmit (DEXCOM G6 TRANSMITTER) MISC 1 each 3    Sig: Use to check blood sugar TID.     Endocrinology: Diabetes - Testing Supplies Passed - 06/02/2022  1:25 PM      Passed - Valid encounter within last 12 months    Recent Outpatient Visits          4 months ago Type 2 diabetes mellitus with other specified complication, with long-term current use of insulin ([place])   [place], Charlane Ferretti, MD   8 months ago Type 2 diabetes mellitus with other specified complication, with long-term current use of insulin (  [place])   [place], [place], MD   11 months ago Type 2 diabetes mellitus with other specified complication, with long-term current use of insulin ([place])   [place], Enobong, MD   1 year ago Spastic hemiparesis [facility])   [place], Charlane Ferretti, MD   1 year ago Uncontrolled type 2 diabetes mellitus with hyperglycemia [facility])   [place], MD      Future Appointments            In 1 month Charlott Rakes, MD [place]

## 2022-06-28 ENCOUNTER — Encounter: Payer: Medicare Other | Attending: Registered Nurse | Admitting: Physical Medicine & Rehabilitation

## 2022-06-28 ENCOUNTER — Encounter: Payer: Self-pay | Admitting: Physical Medicine & Rehabilitation

## 2022-06-28 VITALS — BP 127/72 | HR 79 | Ht 67.0 in | Wt 152.4 lb

## 2022-06-28 DIAGNOSIS — G8111 Spastic hemiplegia affecting right dominant side: Secondary | ICD-10-CM | POA: Diagnosis not present

## 2022-06-28 MED ORDER — SODIUM CHLORIDE (PF) 0.9 % IJ SOLN
7.5000 mL | Freq: Once | INTRAMUSCULAR | Status: AC
  Administered 2022-06-28: 7.5 mL

## 2022-06-28 MED ORDER — ABOBOTULINUMTOXINA 500 UNITS IM SOLR
1500.0000 [IU] | Freq: Once | INTRAMUSCULAR | Status: AC
  Administered 2022-06-28: 1500 [IU] via INTRAMUSCULAR

## 2022-06-28 NOTE — Patient Instructions (Signed)

## 2022-06-28 NOTE — Progress Notes (Signed)
Dysport Injection for spasticity using needle EMG guidance  Dilution: 200 Units/ml Indication: Severe spasticity which interferes with ADL,mobility and/or  hygiene and is unresponsive to medication management and other conservative care Informed consent was obtained after describing risks and benefits of the procedure with the patient. This includes bleeding, bruising, infection, excessive weakness, or medication side effects. A REMS form is on file and signed. Needle:  needle electrode Number of units per muscle RIGHT    Biceps:            100                                     Brachioradialis 100                                     FCR                 200                                     PT                   200                                     FDS                 200                                     FDP                 100                                       Right    Soleus 100 FDL 100U Hamstrings 400U All injections were done after obtaining appropriate EMG activity and after negative drawback for blood. The patient tolerated the procedure well. Post procedure instructions were given. A followup appointment was made 6 wk.

## 2022-07-21 ENCOUNTER — Ambulatory Visit: Payer: Medicare Other | Attending: Family Medicine | Admitting: Family Medicine

## 2022-07-21 ENCOUNTER — Encounter: Payer: Self-pay | Admitting: Family Medicine

## 2022-07-21 VITALS — BP 128/71 | HR 78 | Ht 67.0 in | Wt 153.2 lb

## 2022-07-21 DIAGNOSIS — Z794 Long term (current) use of insulin: Secondary | ICD-10-CM | POA: Diagnosis not present

## 2022-07-21 DIAGNOSIS — E1169 Type 2 diabetes mellitus with other specified complication: Secondary | ICD-10-CM

## 2022-07-21 DIAGNOSIS — F17210 Nicotine dependence, cigarettes, uncomplicated: Secondary | ICD-10-CM | POA: Diagnosis not present

## 2022-07-21 DIAGNOSIS — Z720 Tobacco use: Secondary | ICD-10-CM

## 2022-07-21 DIAGNOSIS — I6930 Unspecified sequelae of cerebral infarction: Secondary | ICD-10-CM

## 2022-07-21 DIAGNOSIS — E1159 Type 2 diabetes mellitus with other circulatory complications: Secondary | ICD-10-CM | POA: Diagnosis not present

## 2022-07-21 DIAGNOSIS — I152 Hypertension secondary to endocrine disorders: Secondary | ICD-10-CM | POA: Diagnosis not present

## 2022-07-21 DIAGNOSIS — Z23 Encounter for immunization: Secondary | ICD-10-CM | POA: Diagnosis not present

## 2022-07-21 DIAGNOSIS — G8110 Spastic hemiplegia affecting unspecified side: Secondary | ICD-10-CM

## 2022-07-21 DIAGNOSIS — L84 Corns and callosities: Secondary | ICD-10-CM | POA: Diagnosis not present

## 2022-07-21 LAB — POCT GLYCOSYLATED HEMOGLOBIN (HGB A1C): HbA1c, POC (controlled diabetic range): 6.7 % (ref 0.0–7.0)

## 2022-07-21 MED ORDER — HYDRALAZINE HCL 100 MG PO TABS: 100.0000 mg | ORAL_TABLET | Freq: Three times a day (TID) | ORAL | 1 refills | Status: DC

## 2022-07-21 MED ORDER — CARVEDILOL 12.5 MG PO TABS: ORAL_TABLET | ORAL | 1 refills | Status: DC

## 2022-07-21 MED ORDER — ROSUVASTATIN CALCIUM 40 MG PO TABS: 40.0000 mg | ORAL_TABLET | Freq: Every day | ORAL | 1 refills | Status: DC

## 2022-07-21 MED ORDER — EZETIMIBE 10 MG PO TABS: 10.0000 mg | ORAL_TABLET | Freq: Every evening | ORAL | 1 refills | Status: DC

## 2022-07-21 MED ORDER — AMLODIPINE BESYLATE 10 MG PO TABS: ORAL_TABLET | ORAL | 1 refills | Status: DC

## 2022-07-21 MED ORDER — ZOSTER VAC RECOMB ADJUVANTED 50 MCG/0.5ML IM SUSR: 0.5000 mL | Freq: Once | INTRAMUSCULAR | 1 refills | Status: AC

## 2022-07-21 MED ORDER — BUPROPION HCL ER (XL) 150 MG PO TB24: 150.0000 mg | ORAL_TABLET | Freq: Every day | ORAL | 1 refills | Status: DC

## 2022-07-21 MED ORDER — INSULIN GLARGINE 100 UNIT/ML SOLOSTAR PEN: 10.0000 [IU] | PEN_INJECTOR | Freq: Every day | SUBCUTANEOUS | 6 refills | Status: DC

## 2022-07-21 NOTE — Progress Notes (Signed)
Subjective:  Patient ID: Casey Petty, male    DOB: 15-Jun-1961  Age: 61 y.o. MRN: [id number]  CC: Diabetes   HPI [name] is a 61 y.o. year old male with a history of  type 2 diabetes mellitus (A1c 6.7) stroke (with right spastic hemiparesis s/p Botox injections by rehab), right adhesive capsulitis, dysarthria, Hypertension, Nicotine dependence (3 Cigarettes /day, <20 pack years) He presents today accompanied by his significant other.  Interval History: Endorses adherence to his diabetes regimen with no complaints of hypoglycemia or neuropathy.  He also has no visual symptoms. His last eye exam was last year and he is due for an updated eye exam.  He has been adherent with his antihypertensive and his statin. He has noticed improvement in his range of motion of his right upper extremity but no longer undergoes physical therapy.  He continues to wear a right AFO brace. Denies additional concerns today. Past Medical History:  Diagnosis Date   Diabetes mellitus without complication ([place])    on meds   Hyperlipidemia    on meds   Hypertension    on meds   Stroke [facility]) 2020   R hemiparesis-abnormal gait    Past Surgical History:  Procedure Laterality Date   WISDOM TOOTH EXTRACTION Bilateral 06/2021    Family History  Problem Relation Age of Onset   Heart attack Father    Cancer Neg Hx    Colon polyps Neg Hx    Colon cancer Neg Hx    Esophageal cancer Neg Hx    Stomach cancer Neg Hx    Rectal cancer Neg Hx     Social History   Socioeconomic History   Marital status: Single    Spouse name: Not on file   Number of children: Not on file   Years of education: Not on file   Highest education level: Not on file  Occupational History   Not on file  Tobacco Use   Smoking status: Every Day    Packs/day: 0.25    Types: Cigarettes   Smokeless tobacco: Never   Tobacco comments:    Smoking 4 cigs/day  Vaping Use   Vaping Use: Never used  Substance and Sexual  Activity   Alcohol use: Not Currently   Drug use: No   Sexual activity: Never  Other Topics Concern   Not on file  Social History Narrative   Not on file   Social Determinants of Health   Financial Resource Strain: Low Risk  (01/27/2022)   Overall Financial Resource Strain (CARDIA)    Difficulty of Paying Living Expenses: Not very hard  Food Insecurity: No Food Insecurity (01/27/2022)   Hunger Vital Sign    Worried About Running Out of Food in the Last Year: Never true    Ran Out of Food in the Last Year: Never true  Transportation Needs: No Transportation Needs (01/27/2022)   PRAPARE - Transportation    Lack of Transportation (Medical): No    Lack of Transportation (Non-Medical): No  Physical Activity: Sufficiently Active (01/27/2022)   Exercise Vital Sign    Days of Exercise per Week: 3 days    Minutes of Exercise per Session: 60 min  Stress: No Stress Concern Present (01/27/2022)   [place]    Feeling of Stress : Not at all  Social Connections: Moderately Isolated (05/31/2021)   Social Connection and Isolation Panel [NHANES]    Frequency of Communication with Friends and Family: More  than three times a week    Frequency of Social Gatherings with Friends and Family: More than three times a week    Attends Religious Services: Never    Marine scientist or Organizations: No    Attends Archivist Meetings: Never    Marital Status: Living with partner    Allergies  Allergen Reactions   Lisinopril Swelling    Angioedema- lips/face    Outpatient Medications Prior to Visit  Medication Sig Dispense Refill   aspirin 81 MG EC tablet Take 1 tablet (81 mg total) by mouth daily. 100 tablet 0   baclofen (LIORESAL) 10 MG tablet Take 3 tablets (30 mg total) by mouth 3 (three) times daily. 30 each 2   Blood Glucose Monitoring Suppl (ACCU-CHEK GUIDE) w/Device KIT 1 each by Does not apply route in the  morning, at noon, in the evening, and at bedtime. 1 kit 0   Blood Glucose Monitoring Suppl (TRUE METRIX METER) w/Device KIT Check blood sugar fasting and before meals and again if pt feels bad (symptoms of hypo). 1 kit 0   COMFORT EZ PEN NEEDLES 32G X 4 MM MISC USE TO INJECT insulin 100 each 2   Continuous Blood Gluc Receiver (DEXCOM G6 RECEIVER) DEVI Use to check blood sugar TID. 1 each 0   Continuous Blood Gluc Sensor (DEXCOM G6 SENSOR) MISC Use to check blood sugar TID. 3 each 3   Continuous Blood Gluc Transmit (DEXCOM G6 TRANSMITTER) MISC Use to check blood sugar TID. 1 each 3   glucose blood (ACCU-CHEK GUIDE) test strip Use as instructed up to 4 times daily 100 each 12   glucose blood (TRUE METRIX BLOOD GLUCOSE TEST) test strip Use as instructed before every meal nightly up to 4 times daily 100 each 12   JARDIANCE 25 MG TABS tablet TAKE ONE TABLET BY MOUTH BEFORE BREAKFAST 30 tablet 2   Misc. Devices MISC Wheelchair with Accessories: elevating leg rests (ELRs), wheel locks, extensions and anti-tippers. Cane or walker will not suffice 1 each 0   Misc. Devices MISC Rollator walker with seat.  Diagnosis - Stroke 1 each 0   tiZANidine (ZANAFLEX) 4 MG tablet Take 1 tablet (4 mg total) by mouth at bedtime. 30 tablet 1   TRUEplus Lancets 28G MISC Check blood sugar before every meal, nightly up to 4 times a day. 100 each 12   amLODipine (NORVASC) 10 MG tablet TAKE ONE TABLET BY MOUTH EVERY MORNING FOR BLOOD PRESSURE 90 tablet 1   buPROPion (WELLBUTRIN XL) 150 MG 24 hr tablet Take 1 tablet (150 mg total) by mouth daily. 90 tablet 1   carvedilol (COREG) 12.5 MG tablet TAKE ONE TABLET BY MOUTH EVERY MORNING and TAKE ONE TABLET BY MOUTH EVERY EVENING 180 tablet 1   ezetimibe (ZETIA) 10 MG tablet TAKE ONE TABLET BY MOUTH EVERY EVENING 90 tablet 1   hydrALAZINE (APRESOLINE) 100 MG tablet TAKE ONE TABLET BY MOUTH THREE TIMES DAILY FOR BLOOD PRESSURE 90 tablet 3   insulin glargine (LANTUS) 100 UNIT/ML  Solostar Pen INJECT 10 UNITS INTO THE SKIN DAILY. 30 mL 6   rosuvastatin (CRESTOR) 40 MG tablet Take 1 tablet (40 mg total) by mouth daily. 90 tablet 1   No facility-administered medications prior to visit.     ROS Review of Systems  Constitutional:  Negative for activity change and appetite change.  HENT:  Negative for sinus pressure and sore throat.   Respiratory:  Negative for chest tightness, shortness of breath  and wheezing.   Cardiovascular:  Negative for chest pain and palpitations.  Gastrointestinal:  Negative for abdominal distention, abdominal pain and constipation.  Genitourinary: Negative.   Musculoskeletal:  Positive for gait problem.  Neurological:  Positive for weakness.  Psychiatric/Behavioral:  Negative for behavioral problems and dysphoric mood.     Objective:  BP 128/71   Pulse 78   Ht _0  (1.702 m)   Wt 153 lb 3.2 oz (69.5 kg)   SpO2 98%   BMI 23.99 kg/m      07/21/2022    8:48 AM 06/28/2022    9:43 AM 05/17/2022    9:10 AM  BP/Weight  Systolic BP [phone]  Diastolic BP 71 72 77  Wt. (Lbs) 153.2 152.4 148.4  BMI 23.99 kg/m2 23.87 kg/m2 23.24 kg/m2      Physical Exam Constitutional:      Appearance: He is well-developed.  Cardiovascular:     Rate and Rhythm: Normal rate.     Heart sounds: Normal heart sounds. No murmur heard. Pulmonary:     Effort: Pulmonary effort is normal.     Breath sounds: Normal breath sounds. No wheezing or rales.  Chest:     Chest wall: No tenderness.  Abdominal:     General: Bowel sounds are normal. There is no distension.     Palpations: Abdomen is soft. There is no mass.     Tenderness: There is no abdominal tenderness.  Musculoskeletal:     Right lower leg: No edema.     Left lower leg: No edema.     Comments: Right spastic hemiparesis  Neurological:     Mental Status: He is alert and oriented to person, place, and time.     Motor: Weakness present.     Gait: Gait abnormal.  Psychiatric:         Mood and Affect: Mood normal.    Diabetic Foot Exam - Simple   Simple Foot Form  07/21/2022  9:24 AM  Visual Inspection See comments: Yes Sensation Testing Intact to touch and monofilament testing bilaterally: Yes Pulse Check Posterior Tibialis and Dorsalis pulse intact bilaterally: Yes Comments Callus on the heel of left foot.  No ulcerations or skin breakdown.        Latest Ref Rng & Units 01/19/2022   10:01 AM 06/16/2021   10:55 AM 09/01/2020   12:12 PM  CMP  Glucose 70 - 99 mg/dL 113  199  389   BUN 6 - 24 mg/dL _1 Creatinine 0.76 - 1.27 mg/dL 0.93  1.04  1.04   Sodium 134 - 144 mmol/L 139  142  139   Potassium 3.5 - 5.2 mmol/L 4.3  4.2  4.4   Chloride 96 - 106 mmol/L 101  104  101   CO2 20 - 29 mmol/L _2 Calcium 8.7 - 10.2 mg/dL 10.0  9.6  10.3   Total Protein 6.0 - 8.5 g/dL   7.2   Total Bilirubin 0.0 - 1.2 mg/dL   0.4   Alkaline Phos 44 - 121 IU/L   101   AST 0 - 40 IU/L   24   ALT 0 - 44 IU/L   42     Lipid Panel     Component Value Date/Time   CHOL 158 08/31/2021 0843   TRIG 116 08/31/2021 0843   HDL 50 08/31/2021 0843   CHOLHDL 3.2 08/31/2021 0843   CHOLHDL 3.5 07/18/2014  1696   VLDL NOT CALC 07/18/2014 0922   LDLCALC 87 08/31/2021 0843    CBC    Component Value Date/Time   WBC 10.2 03/18/2019 1123   RBC 5.00 03/18/2019 1123   HGB 15.2 03/18/2019 1123   HCT 46.2 03/18/2019 1123   PLT 278 03/18/2019 1123   MCV 92.4 03/18/2019 1123   MCH 30.4 03/18/2019 1123   MCHC 32.9 03/18/2019 1123   RDW 12.8 03/18/2019 1123   LYMPHSABS 3.1 03/05/2019 0702   MONOABS 1.6 (H) 03/05/2019 0702   EOSABS 0.0 03/05/2019 0702   BASOSABS 0.0 03/05/2019 7893    Lab Results  Component Value Date   HGBA1C 6.7 07/21/2022    Assessment & Plan:  1. Type 2 diabetes mellitus with other specified complication, with long-term current use of insulin (HCC) Controlled with A1c of 6.7 Continue current regimen Counseled on Diabetic diet, my plate  method, 810 minutes of moderate intensity exercise/week Blood sugar logs with fasting goals of 80-120 mg/dl, random of less than 180 and in the event of sugars less than 60 mg/dl or greater than 400 mg/dl encouraged to notify the clinic. Advised on the need for annual eye exams, annual foot exams, Pneumonia vaccine. - POCT glycosylated hemoglobin (Hb A1C) - Microalbumin/Creatinine Ratio, Urine - Ambulatory referral to Ophthalmology - insulin glargine (LANTUS) 100 UNIT/ML Solostar Pen; INJECT 10 UNITS INTO THE SKIN DAILY.  Dispense: 30 mL; Refill: 6 - CMP14+EGFR - LP+Non-HDL Cholesterol  2. Callus Counseled on diabetic footcare - Ambulatory referral to Podiatry  3. Spastic hemiparesis (HCC) Secondary to stroke Right spastic hemiparesis Status post Botox injections He will benefit from additional sessions of PT Continue right AFO brace - Ambulatory referral to Physical Therapy  4. Hypertension associated with diabetes ([place]) Controlled Continue current regimen Counseled on blood pressure goal of less than 130/80, low-sodium, DASH diet, medication compliance, 150 minutes of moderate intensity exercise per week. Discussed medication compliance, adverse effects. - amLODipine (NORVASC) 10 MG tablet; TAKE ONE TABLET BY MOUTH EVERY MORNING FOR BLOOD PRESSURE  Dispense: 90 tablet; Refill: 1 - carvedilol (COREG) 12.5 MG tablet; TAKE ONE TABLET BY MOUTH EVERY MORNING and TAKE ONE TABLET BY MOUTH EVERY EVENING  Dispense: 180 tablet; Refill: 1 - hydrALAZINE (APRESOLINE) 100 MG tablet; Take 1 tablet (100 mg total) by mouth 3 (three) times daily.  Dispense: 270 tablet; Refill: 1  5. Tobacco abuse Spent 3 minutes counseling on smoking cessation and has low-dose effect of smoking but he is not interested in quitting at this time - buPROPion (WELLBUTRIN XL) 150 MG 24 hr tablet; Take 1 tablet (150 mg total) by mouth daily.  Dispense: 90 tablet; Refill: 1  6. Need for immunization against influenza -  Flu Vaccine QUAD 63moIM (Fluarix, Fluzone & Alfiuria Quad PF)  7. History of CVA with residual deficit Secondary risk factor modification - ezetimibe (ZETIA) 10 MG tablet; Take 1 tablet (10 mg total) by mouth every evening.  Dispense: 90 tablet; Refill: 1 - rosuvastatin (CRESTOR) 40 MG tablet; Take 1 tablet (40 mg total) by mouth daily.  Dispense: 90 tablet; Refill: 1   Meds ordered this encounter  Medications   Zoster Vaccine Adjuvanted ([facility] injection    Sig: Inject 0.5 mLs into the muscle once for 1 dose.    Dispense:  0.5 mL    Refill:  1   amLODipine (NORVASC) 10 MG tablet    Sig: TAKE ONE TABLET BY MOUTH EVERY MORNING FOR BLOOD PRESSURE    Dispense:  90  tablet    Refill:  1   buPROPion (WELLBUTRIN XL) 150 MG 24 hr tablet    Sig: Take 1 tablet (150 mg total) by mouth daily.    Dispense:  90 tablet    Refill:  1    This prescription was filled on 10/15/2021. Any refills authorized will be placed on file.   carvedilol (COREG) 12.5 MG tablet    Sig: TAKE ONE TABLET BY MOUTH EVERY MORNING and TAKE ONE TABLET BY MOUTH EVERY EVENING    Dispense:  180 tablet    Refill:  1    This prescription was filled on 10/15/2021. Any refills authorized will be placed on file.   ezetimibe (ZETIA) 10 MG tablet    Sig: Take 1 tablet (10 mg total) by mouth every evening.    Dispense:  90 tablet    Refill:  1   hydrALAZINE (APRESOLINE) 100 MG tablet    Sig: Take 1 tablet (100 mg total) by mouth 3 (three) times daily.    Dispense:  270 tablet    Refill:  1   insulin glargine (LANTUS) 100 UNIT/ML Solostar Pen    Sig: INJECT 10 UNITS INTO THE SKIN DAILY.    Dispense:  30 mL    Refill:  6   rosuvastatin (CRESTOR) 40 MG tablet    Sig: Take 1 tablet (40 mg total) by mouth daily.    Dispense:  90 tablet    Refill:  1    Follow-up: Return in about 6 months (around 01/20/2023) for Chronic medical conditions.       Charlott Rakes, MD, FAAFP. [facility] and [place], [place]   07/21/2022, 1:24 PM

## 2022-07-22 ENCOUNTER — Ambulatory Visit: Payer: Medicare Other | Attending: Family Medicine

## 2022-07-22 DIAGNOSIS — E1169 Type 2 diabetes mellitus with other specified complication: Secondary | ICD-10-CM | POA: Diagnosis not present

## 2022-07-22 DIAGNOSIS — Z794 Long term (current) use of insulin: Secondary | ICD-10-CM | POA: Diagnosis not present

## 2022-07-22 LAB — MICROALBUMIN / CREATININE URINE RATIO
Creatinine, Urine: 43.3 mg/dL
Microalb/Creat Ratio: <7 mg/g creat (ref 0–29)
Microalbumin, Urine: <3 ug/mL

## 2022-07-23 LAB — CMP14+EGFR
ALT: 73 IU/L — ABNORMAL HIGH (ref 0–44)
AST: 61 IU/L — ABNORMAL HIGH (ref 0–40)
Albumin/Globulin Ratio: 1.9 (ref 1.2–2.2)
Albumin: 4.6 g/dL (ref 3.8–4.9)
Alkaline Phosphatase: 90 IU/L (ref 44–121)
BUN/Creatinine Ratio: 7 — ABNORMAL LOW (ref 9–20)
BUN: 7 mg/dL (ref 6–24)
Bilirubin Total: 0.4 mg/dL (ref 0.0–1.2)
CO2: 23 mmol/L (ref 20–29)
Calcium: 10 mg/dL (ref 8.7–10.2)
Chloride: 100 mmol/L (ref 96–106)
Creatinine, Ser: 0.96 mg/dL (ref 0.76–1.27)
Globulin, Total: 2.4 g/dL (ref 1.5–4.5)
Glucose: 104 mg/dL — ABNORMAL HIGH (ref 70–99)
Potassium: 4.1 mmol/L (ref 3.5–5.2)
Sodium: 140 mmol/L (ref 134–144)
Total Protein: 7 g/dL (ref 6.0–8.5)
eGFR: 91 mL/min/{1.73_m2} (ref >59)

## 2022-07-23 LAB — LP+NON-HDL CHOLESTEROL
Cholesterol, Total: 119 mg/dL (ref 100–199)
HDL: 43 mg/dL (ref >39)
LDL Chol Calc (NIH): 57 mg/dL (ref 0–99)
Total Non-HDL-Chol (LDL+VLDL): 76 mg/dL (ref 0–129)
Triglycerides: 101 mg/dL (ref 0–149)
VLDL Cholesterol Cal: 19 mg/dL (ref 5–40)

## 2022-07-25 ENCOUNTER — Other Ambulatory Visit: Payer: Self-pay | Admitting: Family Medicine

## 2022-07-25 DIAGNOSIS — R7989 Other specified abnormal findings of blood chemistry: Secondary | ICD-10-CM

## 2022-07-27 ENCOUNTER — Other Ambulatory Visit: Payer: Self-pay | Admitting: Family Medicine

## 2022-08-02 NOTE — Therapy (Signed)
OUTPATIENT PHYSICAL THERAPY NEURO EVALUATION   Patient Name: Casey Petty MRN: [id number] DOB:[birth year], 61 y.o., male Today's Date: 08/03/2022   PCP: Charlott Rakes REFERRING PROVIDER: Charlott Rakes  END OF SESSION:  PT End of Session - 08/03/22 0758     Visit Number 1    Date for PT Re-Evaluation 10/19/22    Authorization Time Period [employer]    PT Start Time 0800    PT Stop Time 0845    PT Time Calculation (min) 45 min    Activity Tolerance Patient tolerated treatment well    Behavior During Therapy WFL for tasks assessed/performed             Past Medical History:  Diagnosis Date   Diabetes mellitus without complication ([place])    on meds   Hyperlipidemia    on meds   Hypertension    on meds   Stroke ([place]) 2020   R hemiparesis-abnormal gait   Past Surgical History:  Procedure Laterality Date   WISDOM TOOTH EXTRACTION Bilateral 06/2021   Patient Active Problem List   Diagnosis Date Noted   Chronic right shoulder pain 06/22/2020   Adhesive capsulitis of right shoulder 10/07/2019   Abnormality of gait 09/17/2019   Spastic hemiparesis of right dominant side ([place]) 07/02/2019   History of CVA with residual deficit 06/09/2019   Spastic hemiparesis ([place])    Uncontrolled type 2 diabetes mellitus with hyperglycemia (HCC)    Noncompliance    Benign essential HTN    Newly diagnosed diabetes ([place])    Leucocytosis    Right hemiparesis ([place])    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   Stroke ([place]) 02/28/2019   Essential hypertension 02/28/2019   Tobacco abuse 02/28/2019   Hyperglycemia 02/28/2019    ONSET DATE: 2020  REFERRING DIAG: G81.10  THERAPY DIAG:  Spastic hemiparesis of right dominant side as late effect of cerebral infarction (HCC)  Muscle weakness (generalized)  Other abnormalities of gait and mobility  Unsteadiness on feet  Rationale for Evaluation and Treatment: Rehabilitation  SUBJECTIVE:                                                                                                                                                                                              SUBJECTIVE STATEMENT: I am doing alright not in any pain. I had a stroke in 2020 I am doing a lot better, I used to be in a wheelchair and I using a cane for over a year. In the house I don't use anything in the house.   Pt accompanied by: significant other  PERTINENT HISTORY: stroke in 2020  PAIN:  Are you having pain? No  PRECAUTIONS: None  WEIGHT BEARING RESTRICTIONS: No  FALLS: Has patient fallen in last 6 months? No  LIVING ENVIRONMENT: Lives with: lives with their spouse Lives in: House/apartment Stairs: No Has following equipment at home: Single point cane, Environmental consultant - 4 wheeled, Wheelchair (manual), and Ramped entry  PLOF: Independent and Independent with basic ADLs  PATIENT GOALS: I want to get stronger and walk better  OBJECTIVE:   COGNITION: Overall cognitive status: Within functional limits for tasks assessed   SENSATION: WFL  POSTURE: rounded shoulders, forward head, flexed trunk , and weight shift left  LOWER EXTREMITY ROM:     Active  Right Eval Left Eval  Hip flexion Impaired d/t stroke unable to do actively   Hip extension    Hip abduction    Hip adduction    Hip internal rotation    Hip external rotation    Knee flexion Can do actively but uncontrolled   Knee extension WFL   Ankle dorsiflexion Foot drop, wears an AFO   Ankle plantarflexion    Ankle inversion    Ankle eversion     (Blank rows = not tested)  LOWER EXTREMITY MMT:    MMT Right Eval Left Eval  Hip flexion 2+ 5  Hip extension    Hip abduction    Hip adduction    Hip internal rotation    Hip external rotation    Knee flexion 3+   Knee extension 3+ 5  Ankle dorsiflexion    Ankle plantarflexion    Ankle inversion    Ankle eversion    (Blank rows = not tested)  GAIT: Gait pattern: decreased arm swing- Right, decreased stance  time- Right, decreased stride length, decreased hip/knee flexion- Right, decreased ankle dorsiflexion- Right, circumduction- Right, shuffling, scissoring, ataxic, lateral lean- Left, narrow BOS, and poor foot clearance- Right Distance walked: in clinic distances  Assistive device utilized: Single point cane Level of assistance: Modified independence Comments: decreased safety awareness, some toe catching  FUNCTIONAL TESTS:  5 times sit to stand: 14.06s Timed up and go (TUG): 19.47s   TODAY'S TREATMENT:                                                                                                                              DATE: 08/03/22-EVAL    PATIENT EDUCATION: Education details: HEP and POC Person educated: Patient Education method: Explanation Education comprehension: verbalized understanding  HOME EXERCISE PROGRAM: Access Code: [email or web address] URL: [email or web address] Date: 08/03/2022 Prepared by: [place] with Counter Support  - 1 x daily - 7 x weekly - 2 sets - 10 reps - Standing Hip Extension with Unilateral Counter Support  - 1 x daily - 7 x weekly - 2 sets - 10 reps - Standing Hip Abduction with Unilateral Counter Support  - 1 x daily - 7 x weekly - 2 sets - 10 reps - Seated Hip  Adduction Isometrics with Ball  - 1 x daily - 7 x weekly - 2 sets - 10 reps  GOALS: Goals reviewed with patient? Yes  SHORT TERM GOALS: Target date: 09/07/22  Patient will be independent with initial HEP. Goal status: INITIAL  2. Patient will improved 5xSTS to <12s Baseline: 14.06s Goal status: INITIAL   LONG TERM GOALS: Target date: 10/19/22  Patient will be independent with advanced/ongoing HEP to improve outcomes and carryover.  Goal status: INITIAL  2.  Patient will demonstrate decreased fall risk by scoring < 15 sec on TUG. Baseline: 19.47s Goal status: INITIAL  3.  Patient will be able to ambulate 600' with LRAD with good safety to  access community.  Baseline: ambulates with SPC and w/o in home Goal status: INITIAL  4.  Patient will demonstrate improved functional LE strength as demonstrated by 4/5 or better. Goal status: INITIAL  5.  Patient will demonstrate at least 20/30 on DGI to improve gait stability and reduce risk for falls. Baseline: 13/30 Goal status: INITIAL  8.  Patient will demonstrate gait speed of >/= 1.8 ft/sec (0.55 m/s) to be a safe limited community ambulator with decreased risk for recurrent falls.  Baseline: 1.25 ft/sec Goal status: INITIAL    ASSESSMENT:  CLINICAL IMPRESSION: Patient is a 61 y.o. male who was seen today for physical therapy evaluation and treatment for spastic hemiparesis after a stroke in 2020. He states he received therapy inpatient and outpatient for a while. He is ambulating with a SPC and wearing an AFO on his R ankle. Patient has some decreased safety awareness and tries to independently do activities and exercises but increases his risk for falls. He has some toe catching with gait and stumbles but able to catch himself. He will benefit from PT to address his strength and gait impairments to be able to decrease his risk for falls.  OBJECTIVE IMPAIRMENTS: Abnormal gait, decreased balance, decreased mobility, difficulty walking, decreased ROM, decreased strength, decreased safety awareness, increased fascial restrictions, impaired tone, and impaired UE functional use.   REHAB POTENTIAL: Fair chronicity of stroke  CLINICAL DECISION MAKING: Stable/uncomplicated  EVALUATION COMPLEXITY: Low  PLAN:  PT FREQUENCY: 1-2x/week  PT DURATION: 10 weeks  PLANNED INTERVENTIONS: Therapeutic exercises, Therapeutic activity, Neuromuscular re-education, Balance training, Gait training, Patient/Family education, Self Care, Joint mobilization, Stair training, Dry Needling, Electrical stimulation, Cryotherapy, Moist heat, Ionotophoresis '4mg'$ /ml Dexamethasone, and Manual therapy  PLAN  FOR NEXT SESSION: gait training, functional strengthening    Andris Baumann, PT 08/03/2022, 8:44 AM

## 2022-08-03 ENCOUNTER — Ambulatory Visit: Payer: Medicare Other | Attending: Family Medicine

## 2022-08-03 DIAGNOSIS — I69351 Hemiplegia and hemiparesis following cerebral infarction affecting right dominant side: Secondary | ICD-10-CM | POA: Diagnosis not present

## 2022-08-03 DIAGNOSIS — G8110 Spastic hemiplegia affecting unspecified side: Secondary | ICD-10-CM | POA: Insufficient documentation

## 2022-08-03 DIAGNOSIS — R2681 Unsteadiness on feet: Secondary | ICD-10-CM | POA: Insufficient documentation

## 2022-08-03 DIAGNOSIS — R2689 Other abnormalities of gait and mobility: Secondary | ICD-10-CM | POA: Insufficient documentation

## 2022-08-03 DIAGNOSIS — M6281 Muscle weakness (generalized): Secondary | ICD-10-CM | POA: Diagnosis not present

## 2022-08-09 ENCOUNTER — Other Ambulatory Visit: Payer: Self-pay | Admitting: Family Medicine

## 2022-08-10 ENCOUNTER — Ambulatory Visit: Payer: Medicare HMO

## 2022-08-11 ENCOUNTER — Ambulatory Visit: Payer: Medicare Other | Admitting: Podiatry

## 2022-08-12 NOTE — Therapy (Signed)
OUTPATIENT PHYSICAL THERAPY NEURO TREATMENT   Patient Name: Casey Petty MRN: [id number] DOB:01-Feb-1961, 62 y.o., male Today's Date: 08/15/2022   PCP: Charlott Rakes REFERRING PROVIDER: Charlott Rakes  END OF SESSION:  PT End of Session - 08/15/22 0758     Visit Number 2    Date for PT Re-Evaluation 10/19/22    Authorization Time Period [employer]    PT Start Time 0800    PT Stop Time 0845    PT Time Calculation (min) 45 min    Activity Tolerance Patient tolerated treatment well    Behavior During Therapy WFL for tasks assessed/performed              Past Medical History:  Diagnosis Date   Diabetes mellitus without complication ([place])    on meds   Hyperlipidemia    on meds   Hypertension    on meds   Stroke ([place]) 2020   R hemiparesis-abnormal gait   Past Surgical History:  Procedure Laterality Date   WISDOM TOOTH EXTRACTION Bilateral 06/2021   Patient Active Problem List   Diagnosis Date Noted   Chronic right shoulder pain 06/22/2020   Adhesive capsulitis of right shoulder 10/07/2019   Abnormality of gait 09/17/2019   Spastic hemiparesis of right dominant side ([place]) 07/02/2019   History of CVA with residual deficit 06/09/2019   Spastic hemiparesis ([place])    Uncontrolled type 2 diabetes mellitus with hyperglycemia ([place])    Noncompliance    Benign essential HTN    Newly diagnosed diabetes ([place])    Leucocytosis    Right hemiparesis ([place])    Basal ganglia stroke ([place]) 03/04/2019   Stroke ([place]) 02/28/2019   Essential hypertension 02/28/2019   Tobacco abuse 02/28/2019   Hyperglycemia 02/28/2019    ONSET DATE: 2020  REFERRING DIAG: G81.10  THERAPY DIAG:  Spastic hemiparesis of right dominant side as late effect of cerebral infarction (HCC)  Muscle weakness (generalized)  Other abnormalities of gait and mobility  Unsteadiness on feet  Rationale for Evaluation and Treatment: Rehabilitation  SUBJECTIVE:                                                                                                                                                                                              SUBJECTIVE STATEMENT: Nothing new, no falls I am just trying to get right.   Pt accompanied by: significant other  PERTINENT HISTORY: stroke in 2020  PAIN:  Are you having pain? No  PRECAUTIONS: None  WEIGHT BEARING RESTRICTIONS: No  FALLS: Has patient fallen in last 6 months? No  LIVING ENVIRONMENT: Lives with: lives with their  spouse Lives in: House/apartment Stairs: No Has following equipment at home: Single point cane, Environmental consultant - 4 wheeled, Wheelchair (manual), and Ramped entry  PLOF: Independent and Independent with basic ADLs  PATIENT GOALS: I want to get stronger and walk better  OBJECTIVE:   COGNITION: Overall cognitive status: Within functional limits for tasks assessed   SENSATION: WFL  POSTURE: rounded shoulders, forward head, flexed trunk , and weight shift left  LOWER EXTREMITY ROM:     Active  Right Eval Left Eval  Hip flexion Impaired d/t stroke unable to do actively   Hip extension    Hip abduction    Hip adduction    Hip internal rotation    Hip external rotation    Knee flexion Can do actively but uncontrolled   Knee extension WFL   Ankle dorsiflexion Foot drop, wears an AFO   Ankle plantarflexion    Ankle inversion    Ankle eversion     (Blank rows = not tested)  LOWER EXTREMITY MMT:    MMT Right Eval Left Eval  Hip flexion 2+ 5  Hip extension    Hip abduction    Hip adduction    Hip internal rotation    Hip external rotation    Knee flexion 3+   Knee extension 3+ 5  Ankle dorsiflexion    Ankle plantarflexion    Ankle inversion    Ankle eversion    (Blank rows = not tested)  GAIT: Gait pattern: decreased arm swing- Right, decreased stance time- Right, decreased stride length, decreased hip/knee flexion- Right, decreased ankle dorsiflexion- Right, circumduction- Right, shuffling,  scissoring, ataxic, lateral lean- Left, narrow BOS, and poor foot clearance- Right Distance walked: in clinic distances  Assistive device utilized: Single point cane Level of assistance: Modified independence Comments: decreased safety awareness, some toe catching  FUNCTIONAL TESTS:  5 times sit to stand: 14.06s Timed up and go (TUG): 19.47s   TODAY'S TREATMENT:                                                                                                                              DATE:  08/15/22 NuStep L4 x 54mns  STS 2x10 LAQ 3# RLE 2x10  HS curls green RLE 2x10  Box taps 4" CGA Step ups 4" on stairs  Standing 2 way leg 2# 2x10  2# standing marches 20 reps  [facility]  08/03/22-EVAL    PATIENT EDUCATION: Education details: HEP and POC Person educated: Patient Education method: Explanation Education comprehension: verbalized understanding  HOME EXERCISE PROGRAM: Access Code: [email or web address]: [email or web address] Date: 08/03/2022 Prepared by: [place] Counter Support  - 1 x daily - 7 x weekly - 2 sets - 10 reps - Standing Hip Extension with Unilateral Counter Support  - 1 x daily - 7 x weekly - 2 sets - 10 reps - Standing Hip Abduction with Unilateral Counter Support  -  1 x daily - 7 x weekly - 2 sets - 10 reps - Seated Hip Adduction Isometrics with Ball  - 1 x daily - 7 x weekly - 2 sets - 10 reps  GOALS: Goals reviewed with patient? Yes  SHORT TERM GOALS: Target date: 09/07/22  Patient will be independent with initial HEP. Goal status: INITIAL  2. Patient will improved 5xSTS to <12s Baseline: 14.06s Goal status: INITIAL   LONG TERM GOALS: Target date: 10/19/22  Patient will be independent with advanced/ongoing HEP to improve outcomes and carryover.  Goal status: INITIAL  2.  Patient will demonstrate decreased fall risk by scoring < 15 sec on TUG. Baseline: 19.47s Goal status: INITIAL  3.   Patient will be able to ambulate 600' with LRAD with good safety to access community.  Baseline: ambulates with SPC and w/o in home Goal status: INITIAL  4.  Patient will demonstrate improved functional LE strength as demonstrated by 4/5 or better. Goal status: INITIAL  5.  Patient will demonstrate at least 20/30 on DGI to improve gait stability and reduce risk for falls. Baseline: 13/30 Goal status: INITIAL  6.  Patient will demonstrate gait speed of >/= 1.8 ft/sec (0.55 m/s) to be a safe limited community ambulator with decreased risk for recurrent falls.  Baseline: 1.25 ft/sec Goal status: INITIAL    ASSESSMENT:  CLINICAL IMPRESSION: Patient returns to therapy with no new reports. We started working on some functional movements and LE strengthening of the right side. Cues needed to correct compensations throughout session. Difficulty with step ups, box taps, and marching due to weak hip flexors and circumduction of RLE.   OBJECTIVE IMPAIRMENTS: Abnormal gait, decreased balance, decreased mobility, difficulty walking, decreased ROM, decreased strength, decreased safety awareness, increased fascial restrictions, impaired tone, and impaired UE functional use.   REHAB POTENTIAL: Fair chronicity of stroke  CLINICAL DECISION MAKING: Stable/uncomplicated  EVALUATION COMPLEXITY: Low  PLAN:  PT FREQUENCY: 1-2x/week  PT DURATION: 10 weeks  PLANNED INTERVENTIONS: Therapeutic exercises, Therapeutic activity, Neuromuscular re-education, Balance training, Gait training, Patient/Family education, Self Care, Joint mobilization, Stair training, Dry Needling, Electrical stimulation, Cryotherapy, Moist heat, Ionotophoresis '4mg'$ /ml Dexamethasone, and Manual therapy  PLAN FOR NEXT SESSION: gait training, functional strengthening, ball squeezes, hip abd   Andris Baumann, PT 08/15/2022, 8:45 AM

## 2022-08-15 ENCOUNTER — Ambulatory Visit: Payer: Medicare HMO | Attending: Family Medicine

## 2022-08-15 DIAGNOSIS — I69351 Hemiplegia and hemiparesis following cerebral infarction affecting right dominant side: Secondary | ICD-10-CM | POA: Diagnosis not present

## 2022-08-15 DIAGNOSIS — M25511 Pain in right shoulder: Secondary | ICD-10-CM | POA: Insufficient documentation

## 2022-08-15 DIAGNOSIS — G8929 Other chronic pain: Secondary | ICD-10-CM | POA: Insufficient documentation

## 2022-08-15 DIAGNOSIS — M6281 Muscle weakness (generalized): Secondary | ICD-10-CM

## 2022-08-15 DIAGNOSIS — R2689 Other abnormalities of gait and mobility: Secondary | ICD-10-CM | POA: Diagnosis not present

## 2022-08-15 DIAGNOSIS — R2681 Unsteadiness on feet: Secondary | ICD-10-CM

## 2022-08-15 NOTE — Therapy (Signed)
OUTPATIENT PHYSICAL THERAPY NEURO TREATMENT   Patient Name: Casey Petty MRN: [id number] DOB:07-08-1961, 62 y.o., male Today's Date: 08/15/2022   PCP: Charlott Rakes REFERRING PROVIDER: Charlott Rakes  END OF SESSION:     Past Medical History:  Diagnosis Date   Diabetes mellitus without complication ([employer])    on meds   Hyperlipidemia    on meds   Hypertension    on meds   Stroke [facility]) 2020   R hemiparesis-abnormal gait   Past Surgical History:  Procedure Laterality Date   WISDOM TOOTH EXTRACTION Bilateral 06/2021   Patient Active Problem List   Diagnosis Date Noted   Chronic right shoulder pain 06/22/2020   Adhesive capsulitis of right shoulder 10/07/2019   Abnormality of gait 09/17/2019   Spastic hemiparesis of right dominant side ([place]) 07/02/2019   History of CVA with residual deficit 06/09/2019   Spastic hemiparesis ([place])    Uncontrolled type 2 diabetes mellitus with hyperglycemia ([place])    Noncompliance    Benign essential HTN    Newly diagnosed diabetes ([place])    Leucocytosis    Right hemiparesis ([place])    Basal ganglia stroke ([place]) 03/04/2019   Stroke ([place]) 02/28/2019   Essential hypertension 02/28/2019   Tobacco abuse 02/28/2019   Hyperglycemia 02/28/2019    ONSET DATE: 2020  REFERRING DIAG: G81.10  THERAPY DIAG:  No diagnosis found.  Rationale for Evaluation and Treatment: Rehabilitation  SUBJECTIVE:                                                                                                                                                                                             SUBJECTIVE STATEMENT: Nothing new, no falls I am just trying to get right.   Pt accompanied by: significant other  PERTINENT HISTORY: stroke in 2020  PAIN:  Are you having pain? No  PRECAUTIONS: None  WEIGHT BEARING RESTRICTIONS: No  FALLS: Has patient fallen in last 6 months? No  LIVING ENVIRONMENT: Lives with: lives with their spouse Lives in:  House/apartment Stairs: No Has following equipment at home: Single point cane, Environmental consultant - 4 wheeled, Wheelchair (manual), and Ramped entry  PLOF: Independent and Independent with basic ADLs  PATIENT GOALS: I want to get stronger and walk better  OBJECTIVE:   COGNITION: Overall cognitive status: Within functional limits for tasks assessed   SENSATION: WFL  POSTURE: rounded shoulders, forward head, flexed trunk , and weight shift left  LOWER EXTREMITY ROM:     Active  Right Eval Left Eval  Hip flexion Impaired d/t stroke unable to do actively   Hip extension    Hip abduction  Hip adduction    Hip internal rotation    Hip external rotation    Knee flexion Can do actively but uncontrolled   Knee extension WFL   Ankle dorsiflexion Foot drop, wears an AFO   Ankle plantarflexion    Ankle inversion    Ankle eversion     (Blank rows = not tested)  LOWER EXTREMITY MMT:    MMT Right Eval Left Eval  Hip flexion 2+ 5  Hip extension    Hip abduction    Hip adduction    Hip internal rotation    Hip external rotation    Knee flexion 3+   Knee extension 3+ 5  Ankle dorsiflexion    Ankle plantarflexion    Ankle inversion    Ankle eversion    (Blank rows = not tested)  GAIT: Gait pattern: decreased arm swing- Right, decreased stance time- Right, decreased stride length, decreased hip/knee flexion- Right, decreased ankle dorsiflexion- Right, circumduction- Right, shuffling, scissoring, ataxic, lateral lean- Left, narrow BOS, and poor foot clearance- Right Distance walked: in clinic distances  Assistive device utilized: Single point cane Level of assistance: Modified independence Comments: decreased safety awareness, some toe catching  FUNCTIONAL TESTS:  5 times sit to stand: 14.06s Timed up and go (TUG): 19.47s   TODAY'S TREATMENT:                                                                                                                              DATE:   08/17/22 NuStep STS on airex  Leg ext HS curls  Leg press  Gait training with SPC and then without    08/15/22 NuStep L4 x 80mns  STS 2x10 LAQ 3# RLE 2x10  HS curls green RLE 2x10  Box taps 4" CGA Step ups 4" on stairs  Standing 2 way leg 2# 2x10  2# standing marches 20 reps  [facility]  08/03/22-EVAL    PATIENT EDUCATION: Education details: HEP and POC Person educated: Patient Education method: Explanation Education comprehension: verbalized understanding  HOME EXERCISE PROGRAM: Access Code: [email or web address]: [email or web address] Date: 08/03/2022 Prepared by: [place] Parkwith Counter Support  - 1 x daily - 7 x weekly - 2 sets - 10 reps - Standing Hip Extension with Unilateral Counter Support  - 1 x daily - 7 x weekly - 2 sets - 10 reps - Standing Hip Abduction with Unilateral Counter Support  - 1 x daily - 7 x weekly - 2 sets - 10 reps - Seated Hip Adduction Isometrics with Ball  - 1 x daily - 7 x weekly - 2 sets - 10 reps  GOALS: Goals reviewed with patient? Yes  SHORT TERM GOALS: Target date: 09/07/22  Patient will be independent with initial HEP. Goal status: INITIAL  2. Patient will improved 5xSTS to <12s Baseline: 14.06s Goal status: INITIAL   LONG TERM GOALS: Target date: 10/19/22  Patient  will be independent with advanced/ongoing HEP to improve outcomes and carryover.  Goal status: INITIAL  2.  Patient will demonstrate decreased fall risk by scoring < 15 sec on TUG. Baseline: 19.47s Goal status: INITIAL  3.  Patient will be able to ambulate 600' with LRAD with good safety to access community.  Baseline: ambulates with SPC and w/o in home Goal status: INITIAL  4.  Patient will demonstrate improved functional LE strength as demonstrated by 4/5 or better. Goal status: INITIAL  5.  Patient will demonstrate at least 20/30 on DGI to improve gait stability and reduce risk for falls. Baseline:  13/30 Goal status: INITIAL  6.  Patient will demonstrate gait speed of >/= 1.8 ft/sec (0.55 m/s) to be a safe limited community ambulator with decreased risk for recurrent falls.  Baseline: 1.25 ft/sec Goal status: INITIAL    ASSESSMENT:  CLINICAL IMPRESSION: Patient returns to therapy with no new reports. We started working on some functional movements and LE strengthening of the right side. Cues needed to correct compensations throughout session. Difficulty with step ups, box taps, and marching due to weak hip flexors and circumduction of RLE.   OBJECTIVE IMPAIRMENTS: Abnormal gait, decreased balance, decreased mobility, difficulty walking, decreased ROM, decreased strength, decreased safety awareness, increased fascial restrictions, impaired tone, and impaired UE functional use.   REHAB POTENTIAL: Fair chronicity of stroke  CLINICAL DECISION MAKING: Stable/uncomplicated  EVALUATION COMPLEXITY: Low  PLAN:  PT FREQUENCY: 1-2x/week  PT DURATION: 10 weeks  PLANNED INTERVENTIONS: Therapeutic exercises, Therapeutic activity, Neuromuscular re-education, Balance training, Gait training, Patient/Family education, Self Care, Joint mobilization, Stair training, Dry Needling, Electrical stimulation, Cryotherapy, Moist heat, Ionotophoresis '4mg'$ /ml Dexamethasone, and Manual therapy  PLAN FOR NEXT SESSION: gait training, functional strengthening, ball squeezes, hip abd   Andris Baumann, PT 08/15/2022, 3:53 PM

## 2022-08-17 ENCOUNTER — Ambulatory Visit: Payer: Medicare HMO

## 2022-08-17 DIAGNOSIS — M25511 Pain in right shoulder: Secondary | ICD-10-CM | POA: Diagnosis not present

## 2022-08-17 DIAGNOSIS — G8929 Other chronic pain: Secondary | ICD-10-CM | POA: Diagnosis not present

## 2022-08-17 DIAGNOSIS — M6281 Muscle weakness (generalized): Secondary | ICD-10-CM

## 2022-08-17 DIAGNOSIS — R2681 Unsteadiness on feet: Secondary | ICD-10-CM | POA: Diagnosis not present

## 2022-08-17 DIAGNOSIS — R2689 Other abnormalities of gait and mobility: Secondary | ICD-10-CM

## 2022-08-17 DIAGNOSIS — I69351 Hemiplegia and hemiparesis following cerebral infarction affecting right dominant side: Secondary | ICD-10-CM | POA: Diagnosis not present

## 2022-08-18 ENCOUNTER — Encounter: Payer: Self-pay | Admitting: Podiatry

## 2022-08-18 ENCOUNTER — Ambulatory Visit (INDEPENDENT_AMBULATORY_CARE_PROVIDER_SITE_OTHER): Payer: Medicare HMO | Admitting: Podiatry

## 2022-08-18 VITALS — BP 120/67 | HR 72

## 2022-08-18 DIAGNOSIS — E1165 Type 2 diabetes mellitus with hyperglycemia: Secondary | ICD-10-CM

## 2022-08-18 DIAGNOSIS — L84 Corns and callosities: Secondary | ICD-10-CM | POA: Diagnosis not present

## 2022-08-18 MED ORDER — UREA 10 % EX CREA: TOPICAL_CREAM | CUTANEOUS | 0 refills | Status: DC | PRN

## 2022-08-18 NOTE — Progress Notes (Signed)
  Subjective:  Patient ID: Casey Petty, male    DOB: [birth year],  MRN: [id number]  Chief Complaint  Patient presents with   Callouses    Patient is here for left foot callous that is very painful.    62 y.o. male presents due to concern for callus on the left foot.  Patient does have a history of type 2 diabetes with hyperglycemia.  Patient states he has minimal pain with a callus however he does have some sensory loss in his foot due to diabetes.  Past Medical History:  Diagnosis Date   Diabetes mellitus without complication ([place])    on meds   Hyperlipidemia    on meds   Hypertension    on meds   Stroke ([place]) 2020   R hemiparesis-abnormal gait    Allergies  Allergen Reactions   Lisinopril Swelling    Angioedema- lips/face    ROS: Negative except as per HPI above  Objective:  General: AAO x3, NAD  Dermatological: Plantar aspect of the left heel there is noted to be a preulcerative hyperkeratotic lesion present diffuse and very thick over the left plantar heel.  Mild pain with palpation of the area there is thick dry skin at this area on debridement there is no underlying ulceration.  Vascular:  Dorsalis Pedis artery and Posterior Tibial artery pedal pulses are 2/4 bilateral.  Capillary fill time < 3 sec to all digits.   Neruologic: Grossly diminished via light touch bilateral protective sensation diminished  Musculoskeletal: No gross boney pedal deformities bilateral. No pain, crepitus, or limitation noted with foot and ankle range of motion bilateral. Muscular strength 5/5 in all groups tested bilateral.  Gait: Unassisted, Nonantalgic.   No images are attached to the encounter.   Assessment:   1. Pre-ulcerative calluses   2. Uncontrolled type 2 diabetes mellitus with hyperglycemia ([place])      Plan:  Patient was evaluated and treated and all questions answered.  # Preulcerative callus plantar aspect left heel All symptomatic hyperkeratoses were safely  debrided with a sterile #15 blade to patient's level of comfort without incident. We discussed preventative and palliative care of these lesions including supportive and accommodative shoegear, padding, prefabricated and custom molded accommodative orthoses, use of a pumice stone and lotions/creams daily. -E Rx for urea 10% cream sent to patient's pharmacy   Return in about 3 months (around 11/17/2022) for [facility]- callus.          Everitt Amber, DPM [facility]

## 2022-08-18 NOTE — Therapy (Signed)
OUTPATIENT PHYSICAL THERAPY NEURO TREATMENT   Patient Name: Casey Petty MRN: [id number] DOB:1960-11-26, 62 y.o., male Today's Date: 08/22/2022   PCP: Charlott Rakes REFERRING PROVIDER: Charlott Rakes  END OF SESSION:  PT End of Session - 08/22/22 0756     Visit Number 4    Date for PT Re-Evaluation 10/19/22    Authorization Time Period [facility] Medicare    PT Start Time [phone]    PT Stop Time 0840    PT Time Calculation (min) 44 min    Activity Tolerance Patient tolerated treatment well    Behavior During Therapy [facility] for tasks assessed/performed               Past Medical History:  Diagnosis Date   Diabetes mellitus without complication ([place])    on meds   Hyperlipidemia    on meds   Hypertension    on meds   Stroke ([place]) 2020   R hemiparesis-abnormal gait   Past Surgical History:  Procedure Laterality Date   WISDOM TOOTH EXTRACTION Bilateral 06/2021   Patient Active Problem List   Diagnosis Date Noted   Chronic right shoulder pain 06/22/2020   Adhesive capsulitis of right shoulder 10/07/2019   Abnormality of gait 09/17/2019   Spastic hemiparesis of right dominant side ([place]) 07/02/2019   History of CVA with residual deficit 06/09/2019   Spastic hemiparesis ([place])    Uncontrolled type 2 diabetes mellitus with hyperglycemia ([place])    Noncompliance    Benign essential HTN    Newly diagnosed diabetes ([place])    Leucocytosis    Right hemiparesis ([place])    Basal ganglia stroke ([place]) 03/04/2019   Stroke ([place]) 02/28/2019   Essential hypertension 02/28/2019   Tobacco abuse 02/28/2019   Hyperglycemia 02/28/2019    ONSET DATE: 2020  REFERRING DIAG: G81.10  THERAPY DIAG:  Spastic hemiparesis of right dominant side as late effect of cerebral infarction (HCC)  Muscle weakness (generalized)  Other abnormalities of gait and mobility  Unsteadiness on feet  Hemiplegia and hemiparesis following cerebral infarction affecting right dominant side ([place])  Rationale for  Evaluation and Treatment: Rehabilitation  SUBJECTIVE:                                                                                                                                                                                             SUBJECTIVE STATEMENT: "Nothing new, no falls or nothing"  Pt accompanied by: significant other  PERTINENT HISTORY: stroke in 2020  PAIN:  Are you having pain? No  PRECAUTIONS: None  WEIGHT BEARING RESTRICTIONS: No  FALLS: Has patient fallen in last 6 months? No  LIVING ENVIRONMENT: Lives with: lives with their spouse Lives in: House/apartment Stairs: No Has following equipment at home: Single point cane, Environmental consultant - 4 wheeled, Wheelchair (manual), and Ramped entry  PLOF: Independent and Independent with basic ADLs  PATIENT GOALS: I want to get stronger and walk better  OBJECTIVE:   COGNITION: Overall cognitive status: Within functional limits for tasks assessed   SENSATION: WFL  POSTURE: rounded shoulders, forward head, flexed trunk , and weight shift left  LOWER EXTREMITY ROM:     Active  Right Eval Left Eval  Hip flexion Impaired d/t stroke unable to do actively   Hip extension    Hip abduction    Hip adduction    Hip internal rotation    Hip external rotation    Knee flexion Can do actively but uncontrolled   Knee extension WFL   Ankle dorsiflexion Foot drop, wears an AFO   Ankle plantarflexion    Ankle inversion    Ankle eversion     (Blank rows = not tested)  LOWER EXTREMITY MMT:    MMT Right Eval Left Eval  Hip flexion 2+ 5  Hip extension    Hip abduction    Hip adduction    Hip internal rotation    Hip external rotation    Knee flexion 3+   Knee extension 3+ 5  Ankle dorsiflexion    Ankle plantarflexion    Ankle inversion    Ankle eversion    (Blank rows = not tested)  GAIT: Gait pattern: decreased arm swing- Right, decreased stance time- Right, decreased stride length, decreased hip/knee flexion-  Right, decreased ankle dorsiflexion- Right, circumduction- Right, shuffling, scissoring, ataxic, lateral lean- Left, narrow BOS, and poor foot clearance- Right Distance walked: in clinic distances  Assistive device utilized: Single point cane Level of assistance: Modified independence Comments: decreased safety awareness, some toe catching  FUNCTIONAL TESTS:  5 times sit to stand: 14.06s Timed up and go (TUG): 19.47s   TODAY'S TREATMENT:                                                                                                                              DATE:  08/22/22 Bike L2 x97mns  NuStep increased resistance and pushing with R side  Leg ext 5# RLE 2x10 HS curls 35# 2x10, RLE 5# x10  Step overs on staircase 4"  Leg press 40# x10, x5 lowering on RLE, x10 20# on RLE 2x5   08/17/22 Gait training with SPC 2 laps  NuStep L5 x612ms LE only  STS on airex 2x10 Leg ext 5# 2x10 RLE only HS curls 35# 2x10, RLE 10# x10 unable to complete full range Leg press 20# 2x10 Hip abduction greenTB 2x10   08/15/22 NuStep L4 x 18m61m  STS 2x10 LAQ 3# RLE 2x10  HS curls green RLE 2x10  Box taps 4" CGA Step ups 4" on stairs  Standing 2 way leg 2# 2x10  2# standing marches  20 reps  2 laps around gym with [facility]   08/03/22-EVAL    PATIENT EDUCATION: Education details: HEP and POC Person educated: Patient Education method: Explanation Education comprehension: verbalized understanding  HOME EXERCISE PROGRAM: Access Code: [email or web address] URL: [email or web address] Date: 08/03/2022 Prepared by: [place] with Counter Support  - 1 x daily - 7 x weekly - 2 sets - 10 reps - Standing Hip Extension with Unilateral Counter Support  - 1 x daily - 7 x weekly - 2 sets - 10 reps - Standing Hip Abduction with Unilateral Counter Support  - 1 x daily - 7 x weekly - 2 sets - 10 reps - Seated Hip Adduction Isometrics with Ball  - 1 x daily - 7 x weekly - 2 sets - 10  reps  GOALS: Goals reviewed with patient? Yes  SHORT TERM GOALS: Target date: 09/07/22  Patient will be independent with initial HEP. Goal status: INITIAL  2. Patient will improved 5xSTS to <12s Baseline: 14.06s Goal status: INITIAL   LONG TERM GOALS: Target date: 10/19/22  Patient will be independent with advanced/ongoing HEP to improve outcomes and carryover.  Goal status: INITIAL  2.  Patient will demonstrate decreased fall risk by scoring < 15 sec on TUG. Baseline: 19.47s Goal status: INITIAL  3.  Patient will be able to ambulate 600' with LRAD with good safety to access community.  Baseline: ambulates with SPC and w/o in home Goal status: INITIAL  4.  Patient will demonstrate improved functional LE strength as demonstrated by 4/5 or better. Goal status: INITIAL  5.  Patient will demonstrate at least 20/30 on DGI to improve gait stability and reduce risk for falls. Baseline: 13/30 Goal status: INITIAL  6.  Patient will demonstrate gait speed of >/= 1.8 ft/sec (0.55 m/s) to be a safe limited community ambulator with decreased risk for recurrent falls.  Baseline: 1.25 ft/sec Goal status: INITIAL    ASSESSMENT:  CLINICAL IMPRESSION: R side NuStep pushes difficulty using just his R side, unable to continue after 1 min eve after resistance was decreased. Patient still circumducts leg when walking and with step ups tries to correct with increased steps. Did the leg machines again, isolated RLE strengthening on leg press. Has difficulty with single leg press but able to do 2 sets of 5.  OBJECTIVE IMPAIRMENTS: Abnormal gait, decreased balance, decreased mobility, difficulty walking, decreased ROM, decreased strength, decreased safety awareness, increased fascial restrictions, impaired tone, and impaired UE functional use.   REHAB POTENTIAL: Fair chronicity of stroke  CLINICAL DECISION MAKING: Stable/uncomplicated  EVALUATION COMPLEXITY: Low  PLAN:  PT FREQUENCY:  1-2x/week  PT DURATION: 10 weeks  PLANNED INTERVENTIONS: Therapeutic exercises, Therapeutic activity, Neuromuscular re-education, Balance training, Gait training, Patient/Family education, Self Care, Joint mobilization, Stair training, Dry Needling, Electrical stimulation, Cryotherapy, Moist heat, Ionotophoresis '4mg'$ /ml Dexamethasone, and Manual therapy  PLAN FOR NEXT SESSION: gait training, obstacle course with step ups and airex    Andris Baumann, PT 08/22/2022, 8:41 AM

## 2022-08-22 ENCOUNTER — Ambulatory Visit: Payer: Medicare HMO

## 2022-08-22 DIAGNOSIS — G8929 Other chronic pain: Secondary | ICD-10-CM | POA: Diagnosis not present

## 2022-08-22 DIAGNOSIS — R2689 Other abnormalities of gait and mobility: Secondary | ICD-10-CM | POA: Diagnosis not present

## 2022-08-22 DIAGNOSIS — M25511 Pain in right shoulder: Secondary | ICD-10-CM | POA: Diagnosis not present

## 2022-08-22 DIAGNOSIS — I69351 Hemiplegia and hemiparesis following cerebral infarction affecting right dominant side: Secondary | ICD-10-CM

## 2022-08-22 DIAGNOSIS — M6281 Muscle weakness (generalized): Secondary | ICD-10-CM

## 2022-08-22 DIAGNOSIS — R2681 Unsteadiness on feet: Secondary | ICD-10-CM

## 2022-08-24 ENCOUNTER — Encounter: Payer: Self-pay | Admitting: Physical Therapy

## 2022-08-24 ENCOUNTER — Ambulatory Visit: Payer: Medicare HMO | Admitting: Physical Therapy

## 2022-08-24 DIAGNOSIS — R2681 Unsteadiness on feet: Secondary | ICD-10-CM | POA: Diagnosis not present

## 2022-08-24 DIAGNOSIS — I69351 Hemiplegia and hemiparesis following cerebral infarction affecting right dominant side: Secondary | ICD-10-CM

## 2022-08-24 DIAGNOSIS — R2689 Other abnormalities of gait and mobility: Secondary | ICD-10-CM | POA: Diagnosis not present

## 2022-08-24 DIAGNOSIS — M6281 Muscle weakness (generalized): Secondary | ICD-10-CM | POA: Diagnosis not present

## 2022-08-24 DIAGNOSIS — G8929 Other chronic pain: Secondary | ICD-10-CM | POA: Diagnosis not present

## 2022-08-24 DIAGNOSIS — M25511 Pain in right shoulder: Secondary | ICD-10-CM | POA: Diagnosis not present

## 2022-08-24 NOTE — Therapy (Signed)
OUTPATIENT PHYSICAL THERAPY NEURO TREATMENT   Patient Name: Casey Petty MRN: [id number] DOB:19-Aug-1960, 62 y.o., male Today's Date: 08/24/2022   PCP: Charlott Rakes REFERRING PROVIDER: Charlott Rakes  END OF SESSION:  PT End of Session - 08/24/22 0754     Visit Number 5    Date for PT Re-Evaluation 10/19/22    Authorization Time Period [facility] Medicare    PT Start Time [phone]    PT Stop Time 0840    PT Time Calculation (min) 47 min    Activity Tolerance Patient tolerated treatment well    Behavior During Therapy [place] for tasks assessed/performed               Past Medical History:  Diagnosis Date   Diabetes mellitus without complication ([place])    on meds   Hyperlipidemia    on meds   Hypertension    on meds   Stroke ([place]) 2020   R hemiparesis-abnormal gait   Past Surgical History:  Procedure Laterality Date   WISDOM TOOTH EXTRACTION Bilateral 06/2021   Patient Active Problem List   Diagnosis Date Noted   Chronic right shoulder pain 06/22/2020   Adhesive capsulitis of right shoulder 10/07/2019   Abnormality of gait 09/17/2019   Spastic hemiparesis of right dominant side ([place]) 07/02/2019   History of CVA with residual deficit 06/09/2019   Spastic hemiparesis (HCC)    Uncontrolled type 2 diabetes mellitus with hyperglycemia (HCC)    Noncompliance    Benign essential HTN    Newly diagnosed diabetes ([place])    Leucocytosis    Right hemiparesis ([place])    Basal ganglia stroke ([place]) 03/04/2019   Stroke ([place]) 02/28/2019   Essential hypertension 02/28/2019   Tobacco abuse 02/28/2019   Hyperglycemia 02/28/2019    ONSET DATE: 2020  REFERRING DIAG: G81.10  THERAPY DIAG:  Spastic hemiparesis of right dominant side as late effect of cerebral infarction (HCC)  Muscle weakness (generalized)  Other abnormalities of gait and mobility  Unsteadiness on feet  Hemiplegia and hemiparesis following cerebral infarction affecting right dominant side (HCC)  Chronic right  shoulder pain  Rationale for Evaluation and Treatment: Rehabilitation  SUBJECTIVE:                                                                                                                                                                                             SUBJECTIVE STATEMENT: No falls, no pain, c/o right leg spasms mostly at night  Pt accompanied by: significant other  PERTINENT HISTORY: stroke in 2020  PAIN:  Are you having pain? No  PRECAUTIONS: None  WEIGHT BEARING RESTRICTIONS: No  FALLS: Has patient fallen in last 6 months? No  LIVING ENVIRONMENT: Lives with: lives with their spouse Lives in: House/apartment Stairs: No Has following equipment at home: Single point cane, Environmental consultant - 4 wheeled, Wheelchair (manual), and Ramped entry  PLOF: Independent and Independent with basic ADLs  PATIENT GOALS: I want to get stronger and walk better  OBJECTIVE:   COGNITION: Overall cognitive status: Within functional limits for tasks assessed   SENSATION: WFL  POSTURE: rounded shoulders, forward head, flexed trunk , and weight shift left  LOWER EXTREMITY ROM:     Active  Right Eval Left Eval  Hip flexion Impaired d/t stroke unable to do actively   Hip extension    Hip abduction    Hip adduction    Hip internal rotation    Hip external rotation    Knee flexion Can do actively but uncontrolled   Knee extension WFL   Ankle dorsiflexion Foot drop, wears an AFO   Ankle plantarflexion    Ankle inversion    Ankle eversion     (Blank rows = not tested)  LOWER EXTREMITY MMT:    MMT Right Eval Left Eval  Hip flexion 2+ 5  Hip extension    Hip abduction    Hip adduction    Hip internal rotation    Hip external rotation    Knee flexion 3+   Knee extension 3+ 5  Ankle dorsiflexion    Ankle plantarflexion    Ankle inversion    Ankle eversion    (Blank rows = not tested)  GAIT: Gait pattern: decreased arm swing- Right, decreased stance time- Right,  decreased stride length, decreased hip/knee flexion- Right, decreased ankle dorsiflexion- Right, circumduction- Right, shuffling, scissoring, ataxic, lateral lean- Left, narrow BOS, and poor foot clearance- Right Distance walked: in clinic distances  Assistive device utilized: Single point cane Level of assistance: Modified independence Comments: decreased safety awareness, some toe catching  FUNCTIONAL TESTS:  5 times sit to stand: 14.06s Timed up and go (TUG): 19.47s   TODAY'S TREATMENT:                                                                                                                              DATE:  08/24/22 Nustep LE only level 6 x 6 minutes Bike level 4 x 6 minutes  3x did the power burst Leg curls 35# 2x10, right only 5# 3x5 Leg extension 5# 2x10 Standing volleyball Direction changes Leg press right only 20# 2x10, then no weight right 2x10 Side stepping over stick In pbars on airex cone hand and toe touches 10# left arm straight arm pull Right only green tband hip ab/adduciton Partial sit ups 2x10  08/22/22 Bike L2 x7mns  NuStep increased resistance and pushing with R side  Leg ext 5# RLE 2x10 HS curls 35# 2x10, RLE 5# x10  Step overs on staircase 4"  Leg press 40# x10, x5 lowering on RLE, x10 20# on RLE  2x5   08/17/22 Gait training with SPC 2 laps  NuStep L5 x71mns LE only  STS on airex 2x10 Leg ext 5# 2x10 RLE only HS curls 35# 2x10, RLE 10# x10 unable to complete full range Leg press 20# 2x10 Hip abduction greenTB 2x10   08/15/22 NuStep L4 x 660ms  STS 2x10 LAQ 3# RLE 2x10  HS curls green RLE 2x10  Box taps 4" CGA Step ups 4" on stairs  Standing 2 way leg 2# 2x10  2# standing marches 20 reps  [facility] 08/03/22-EVAL    PATIENT EDUCATION: Education details: HEP and POC Person educated: Patient Education method: Explanation Education comprehension: verbalized understanding  HOME EXERCISE PROGRAM: Access Code:  [email or web address]: [email or web address] Date: 08/03/2022 Prepared by: [place] Counter Support  - 1 x daily - 7 x weekly - 2 sets - 10 reps - Standing Hip Extension with Unilateral Counter Support  - 1 x daily - 7 x weekly - 2 sets - 10 reps - Standing Hip Abduction with Unilateral Counter Support  - 1 x daily - 7 x weekly - 2 sets - 10 reps - Seated Hip Adduction Isometrics with Ball  - 1 x daily - 7 x weekly - 2 sets - 10 reps  GOALS: Goals reviewed with patient? Yes  SHORT TERM GOALS: Target date: 09/07/22  Patient will be independent with initial HEP. Goal status: met  2. Patient will improved 5xSTS to <12s Baseline: 14.06s Goal status: INITIAL   LONG TERM GOALS: Target date: 10/19/22  Patient will be independent with advanced/ongoing HEP to improve outcomes and carryover.  Goal status: INITIAL  2.  Patient will demonstrate decreased fall risk by scoring < 15 sec on TUG. Baseline: 19.47s Goal status:ongoing  3.  Patient will be able to ambulate 600' with LRAD with good safety to access community.  Baseline: ambulates with SPC and w/o in home Goal status:progressing  4.  Patient will demonstrate improved functional LE strength as demonstrated by 4/5 or better. Goal status: INITIAL  5.  Patient will demonstrate at least 20/30 on DGI to improve gait stability and reduce risk for falls. Baseline: 13/30 Goal status: INITIAL  6.  Patient will demonstrate gait speed of >/= 1.8 ft/sec (0.55 m/s) to be a safe limited community ambulator with decreased risk for recurrent falls.  Baseline: 1.25 ft/sec Goal status: INITIAL    ASSESSMENT:  CLINICAL IMPRESSION: Patient doing well, we tried to really work some legs and abs today, he did well and no c/o pain he did get fatigued and was a little shaky.  Difficulty with the right leg picking it up to clear things  OBJECTIVE IMPAIRMENTS: Abnormal gait, decreased balance, decreased  mobility, difficulty walking, decreased ROM, decreased strength, decreased safety awareness, increased fascial restrictions, impaired tone, and impaired UE functional use.   REHAB POTENTIAL: Fair chronicity of stroke  CLINICAL DECISION MAKING: Stable/uncomplicated  EVALUATION COMPLEXITY: Low  PLAN:  PT FREQUENCY: 1-2x/week  PT DURATION: 10 weeks  PLANNED INTERVENTIONS: Therapeutic exercises, Therapeutic activity, Neuromuscular re-education, Balance training, Gait training, Patient/Family education, Self Care, Joint mobilization, Stair training, Dry Needling, Electrical stimulation, Cryotherapy, Moist heat, Ionotophoresis '4mg'$ /ml Dexamethasone, and Manual therapy  PLAN FOR NEXT SESSION: gait training, obstacle course with step ups and airex    Jahnai Slingerland W, PT 08/24/2022, 7:55 AM

## 2022-08-26 ENCOUNTER — Ambulatory Visit: Payer: Medicare HMO | Attending: Family Medicine

## 2022-08-26 DIAGNOSIS — R7989 Other specified abnormal findings of blood chemistry: Secondary | ICD-10-CM

## 2022-08-27 LAB — CMP14+EGFR
ALT: 38 IU/L (ref 0–44)
AST: 35 IU/L (ref 0–40)
Albumin/Globulin Ratio: 1.9 (ref 1.2–2.2)
Albumin: 4.6 g/dL (ref 3.8–4.9)
Alkaline Phosphatase: 98 IU/L (ref 44–121)
BUN/Creatinine Ratio: 6 — ABNORMAL LOW (ref 9–20)
BUN: 6 mg/dL (ref 6–24)
Bilirubin Total: 0.4 mg/dL (ref 0.0–1.2)
CO2: 22 mmol/L (ref 20–29)
Calcium: 9.7 mg/dL (ref 8.7–10.2)
Chloride: 103 mmol/L (ref 96–106)
Creatinine, Ser: 0.95 mg/dL (ref 0.76–1.27)
Globulin, Total: 2.4 g/dL (ref 1.5–4.5)
Glucose: 127 mg/dL — ABNORMAL HIGH (ref 70–99)
Potassium: 4.4 mmol/L (ref 3.5–5.2)
Sodium: 141 mmol/L (ref 134–144)
Total Protein: 7 g/dL (ref 6.0–8.5)
eGFR: 92 mL/min/{1.73_m2} (ref >59)

## 2022-08-30 ENCOUNTER — Ambulatory Visit: Payer: Medicare HMO | Admitting: Physical Therapy

## 2022-08-30 ENCOUNTER — Other Ambulatory Visit: Payer: Self-pay | Admitting: Podiatry

## 2022-08-30 ENCOUNTER — Encounter: Payer: Self-pay | Admitting: Physical Therapy

## 2022-08-30 DIAGNOSIS — R2681 Unsteadiness on feet: Secondary | ICD-10-CM

## 2022-08-30 DIAGNOSIS — M25511 Pain in right shoulder: Secondary | ICD-10-CM | POA: Diagnosis not present

## 2022-08-30 DIAGNOSIS — G8929 Other chronic pain: Secondary | ICD-10-CM | POA: Diagnosis not present

## 2022-08-30 DIAGNOSIS — M6281 Muscle weakness (generalized): Secondary | ICD-10-CM | POA: Diagnosis not present

## 2022-08-30 DIAGNOSIS — I69351 Hemiplegia and hemiparesis following cerebral infarction affecting right dominant side: Secondary | ICD-10-CM | POA: Diagnosis not present

## 2022-08-30 DIAGNOSIS — R2689 Other abnormalities of gait and mobility: Secondary | ICD-10-CM | POA: Diagnosis not present

## 2022-08-30 NOTE — Therapy (Signed)
OUTPATIENT PHYSICAL THERAPY NEURO TREATMENT   Patient Name: Casey Petty MRN: [id number] DOB:[birth year], 62 y.o., male Today's Date: 08/30/2022   PCP: Charlott Rakes REFERRING PROVIDER: Charlott Rakes  END OF SESSION:  PT End of Session - 08/30/22 0749     Visit Number 6    Date for PT Re-Evaluation 10/19/22    Authorization Time Period [facility] Medicare    PT Start Time 3267    PT Stop Time 0835    PT Time Calculation (min) 46 min    Activity Tolerance Patient tolerated treatment well    Behavior During Therapy [facility] for tasks assessed/performed               Past Medical History:  Diagnosis Date   Diabetes mellitus without complication ([place])    on meds   Hyperlipidemia    on meds   Hypertension    on meds   Stroke ([place]) 2020   R hemiparesis-abnormal gait   Past Surgical History:  Procedure Laterality Date   WISDOM TOOTH EXTRACTION Bilateral 06/2021   Patient Active Problem List   Diagnosis Date Noted   Chronic right shoulder pain 06/22/2020   Adhesive capsulitis of right shoulder 10/07/2019   Abnormality of gait 09/17/2019   Spastic hemiparesis of right dominant side ([place]) 07/02/2019   History of CVA with residual deficit 06/09/2019   Spastic hemiparesis ([place])    Uncontrolled type 2 diabetes mellitus with hyperglycemia ([place])    Noncompliance    Benign essential HTN    Newly diagnosed diabetes ([place])    Leucocytosis    Right hemiparesis ([place])    Basal ganglia stroke ([place]) 03/04/2019   Stroke ([place]) 02/28/2019   Essential hypertension 02/28/2019   Tobacco abuse 02/28/2019   Hyperglycemia 02/28/2019    ONSET DATE: 2020  REFERRING DIAG: G81.10  THERAPY DIAG:  Spastic hemiparesis of right dominant side as late effect of cerebral infarction (HCC)  Muscle weakness (generalized)  Other abnormalities of gait and mobility  Unsteadiness on feet  Hemiplegia and hemiparesis following cerebral infarction affecting right dominant side ([place])  Rationale for  Evaluation and Treatment: Rehabilitation  SUBJECTIVE:                                                                                                                                                                                             SUBJECTIVE STATEMENT: No falls, no pain, reports still with the spasms in the right leg at night and sometimes in the day time  Pt accompanied by: significant other  PERTINENT HISTORY: stroke in 2020  PAIN:  Are you having pain? No  PRECAUTIONS: None  WEIGHT BEARING RESTRICTIONS: No  FALLS: Has patient fallen in last 6 months? No  LIVING ENVIRONMENT: Lives with: lives with their spouse Lives in: House/apartment Stairs: No Has following equipment at home: Single point cane, Environmental consultant - 4 wheeled, Wheelchair (manual), and Ramped entry  PLOF: Independent and Independent with basic ADLs  PATIENT GOALS: I want to get stronger and walk better  OBJECTIVE:   COGNITION: Overall cognitive status: Within functional limits for tasks assessed   SENSATION: WFL  POSTURE: rounded shoulders, forward head, flexed trunk , and weight shift left  LOWER EXTREMITY ROM:     Active  Right Eval Left Eval  Hip flexion Impaired d/t stroke unable to do actively   Hip extension    Hip abduction    Hip adduction    Hip internal rotation    Hip external rotation    Knee flexion Can do actively but uncontrolled   Knee extension WFL   Ankle dorsiflexion Foot drop, wears an AFO   Ankle plantarflexion    Ankle inversion    Ankle eversion     (Blank rows = not tested)  LOWER EXTREMITY MMT:    MMT Right Eval Left Eval  Hip flexion 2+ 5  Hip extension    Hip abduction    Hip adduction    Hip internal rotation    Hip external rotation    Knee flexion 3+   Knee extension 3+ 5  Ankle dorsiflexion    Ankle plantarflexion    Ankle inversion    Ankle eversion    (Blank rows = not tested)  GAIT: Gait pattern: decreased arm swing- Right, decreased  stance time- Right, decreased stride length, decreased hip/knee flexion- Right, decreased ankle dorsiflexion- Right, circumduction- Right, shuffling, scissoring, ataxic, lateral lean- Left, narrow BOS, and poor foot clearance- Right Distance walked: in clinic distances  Assistive device utilized: Single point cane Level of assistance: Modified independence Comments: decreased safety awareness, some toe catching  FUNCTIONAL TESTS:  5 times sit to stand: 14.06s Timed up and go (TUG): 19.47s,  08/30/22 = 13 s   TODAY'S TREATMENT:                                                                                                                              DATE:  1/232/24 Nustep level 6 x 6 minutes TUG no device 13 seconds Volleyball standing Leg extension 5# cues for TKE Leg curls 10# 2 x10 10# left arm pulls , right arm 5# Leg press 20# x10, then right only with some help to protect knee 3x5 reps Lats 15# 2x6 reps Cone toe touches  Cone hand touches on airex 6" toe clears trying to limit circumduction Hip abduction right Fast walking with use of gait belt, no device 4" step negotiating open floor  08/24/22 Nustep LE only level 6 x 6 minutes Bike level 4 x 6 minutes  3x did the power burst Leg curls 35# 2x10, right only  5# 3x5 Leg extension 5# 2x10 Standing volleyball Direction changes Leg press right only 20# 2x10, then no weight right 2x10 Side stepping over stick In pbars on airex cone hand and toe touches 10# left arm straight arm pull Right only green tband hip ab/adduciton Partial sit ups 2x10  08/22/22 Bike L2 x2mns  NuStep increased resistance and pushing with R side  Leg ext 5# RLE 2x10 HS curls 35# 2x10, RLE 5# x10  Step overs on staircase 4"  Leg press 40# x10, x5 lowering on RLE, x10 20# on RLE 2x5   08/17/22 Gait training with SPC 2 laps  NuStep L5 x628ms LE only  STS on airex 2x10 Leg ext 5# 2x10 RLE only HS curls 35# 2x10, RLE 10# x10 unable to complete  full range Leg press 20# 2x10 Hip abduction greenTB 2x10   08/15/22 NuStep L4 x 42m43m  STS 2x10 LAQ 3# RLE 2x10  HS curls green RLE 2x10  Box taps 4" CGA Step ups 4" on stairs  Standing 2 way leg 2# 2x10  2# standing marches 20 reps  2 laps [facility]    PATIENT EDUCATION: Education details: HEP and POC Person educated: Patient Education method: Explanation Education comprehension: verbalized understanding  HOME EXERCISE PROGRAM: Access Code: [email or web address]: [email or web address] Date: 08/03/2022 Prepared by: MonShannonth Counter Support  - 1 x daily - 7 x weekly - 2 sets - 10 reps - Standing Hip Extension with Unilateral Counter Support  - 1 x daily - 7 x weekly - 2 sets - 10 reps - Standing Hip Abduction with Unilateral Counter Support  - 1 x daily - 7 x weekly - 2 sets - 10 reps - Seated Hip Adduction Isometrics with Ball  - 1 x daily - 7 x weekly - 2 sets - 10 reps  GOALS: Goals reviewed with patient? Yes  SHORT TERM GOALS: Target date: 09/07/22  Patient will be independent with initial HEP. Goal status: met  2. Patient will improved 5xSTS to <12s Baseline: 14.06s Goal status: INITIAL   LONG TERM GOALS: Target date: 10/19/22  Patient will be independent with advanced/ongoing HEP to improve outcomes and carryover.  Goal status: INITIAL  2.  Patient will demonstrate decreased fall risk by scoring < 15 sec on TUG. Baseline: 19.47s Goal status:ongoing  3.  Patient will be able to ambulate 600' with LRAD with good safety to access community.  Baseline: ambulates with SPC and w/o in home Goal status:progressing  4.  Patient will demonstrate improved functional LE strength as demonstrated by 4/5 or better. Goal status: INITIAL  5.  Patient will demonstrate at least 20/30 on DGI to improve gait stability and reduce risk for falls. Baseline: 13/30 Goal status: INITIAL  6.  Patient will  demonstrate gait speed of >/= 1.8 ft/sec (0.55 m/s) to be a safe limited community ambulator with decreased risk for recurrent falls.  Baseline: 1.25 ft/sec Goal status: INITIAL    ASSESSMENT:  CLINICAL IMPRESSION: Patient doing well, I am trying to add things to challenge him, did well with the volleyball but does not get out of his comfort zone, he really has trouble with hip flexion, tends to have to really circumduct to clear a step OBJECTIVE IMPAIRMENTS: Abnormal gait, decreased balance, decreased mobility, difficulty walking, decreased ROM, decreased strength, decreased safety awareness, increased fascial restrictions, impaired tone, and impaired UE functional use.   REHAB POTENTIAL: Fair chronicity of stroke  CLINICAL DECISION MAKING: Stable/uncomplicated  EVALUATION COMPLEXITY: Low  PLAN:  PT FREQUENCY: 1-2x/week  PT DURATION: 10 weeks  PLANNED INTERVENTIONS: Therapeutic exercises, Therapeutic activity, Neuromuscular re-education, Balance training, Gait training, Patient/Family education, Self Care, Joint mobilization, Stair training, Dry Needling, Electrical stimulation, Cryotherapy, Moist heat, Ionotophoresis '4mg'$ /ml Dexamethasone, and Manual therapy  PLAN FOR NEXT SESSION: gait training, obstacle course with step ups and airex    Vanette Noguchi W, PT 08/30/2022, 7:50 AM

## 2022-09-01 ENCOUNTER — Ambulatory Visit: Payer: Medicare HMO | Admitting: Physical Therapy

## 2022-09-01 ENCOUNTER — Encounter: Payer: Self-pay | Admitting: Physical Therapy

## 2022-09-01 DIAGNOSIS — R2689 Other abnormalities of gait and mobility: Secondary | ICD-10-CM

## 2022-09-01 DIAGNOSIS — M25511 Pain in right shoulder: Secondary | ICD-10-CM | POA: Diagnosis not present

## 2022-09-01 DIAGNOSIS — I69351 Hemiplegia and hemiparesis following cerebral infarction affecting right dominant side: Secondary | ICD-10-CM | POA: Diagnosis not present

## 2022-09-01 DIAGNOSIS — R2681 Unsteadiness on feet: Secondary | ICD-10-CM | POA: Diagnosis not present

## 2022-09-01 DIAGNOSIS — M6281 Muscle weakness (generalized): Secondary | ICD-10-CM

## 2022-09-01 DIAGNOSIS — G8929 Other chronic pain: Secondary | ICD-10-CM

## 2022-09-01 NOTE — Therapy (Signed)
OUTPATIENT PHYSICAL THERAPY NEURO TREATMENT   Patient Name: Casey Petty MRN: [id number] DOB:June 24, 1961, 62 y.o., male Today's Date: 09/05/2022   PCP: Charlott Rakes REFERRING PROVIDER: Charlott Rakes  END OF SESSION:  PT End of Session - 09/05/22 0758     Visit Number 8    Date for PT Re-Evaluation 10/19/22    Authorization Time Period [facility] Medicare    PT Start Time [phone]    PT Stop Time 0845    PT Time Calculation (min) 47 min    Activity Tolerance Patient tolerated treatment well    Behavior During Therapy [facility] for tasks assessed/performed               Past Medical History:  Diagnosis Date   Diabetes mellitus without complication ([place])    on meds   Hyperlipidemia    on meds   Hypertension    on meds   Stroke ([place]) 2020   R hemiparesis-abnormal gait   Past Surgical History:  Procedure Laterality Date   WISDOM TOOTH EXTRACTION Bilateral 06/2021   Patient Active Problem List   Diagnosis Date Noted   Chronic right shoulder pain 06/22/2020   Adhesive capsulitis of right shoulder 10/07/2019   Abnormality of gait 09/17/2019   Spastic hemiparesis of right dominant side ([place]) 07/02/2019   History of CVA with residual deficit 06/09/2019   Spastic hemiparesis ([place])    Uncontrolled type 2 diabetes mellitus with hyperglycemia ([place])    Noncompliance    Benign essential HTN    Newly diagnosed diabetes ([place])    Leucocytosis    Right hemiparesis ([place])    Basal ganglia stroke ([place]) 03/04/2019   Stroke ([place]) 02/28/2019   Essential hypertension 02/28/2019   Tobacco abuse 02/28/2019   Hyperglycemia 02/28/2019    ONSET DATE: 2020  REFERRING DIAG: G81.10  THERAPY DIAG:  Spastic hemiparesis of right dominant side as late effect of cerebral infarction (HCC)  Muscle weakness (generalized)  Other abnormalities of gait and mobility  Unsteadiness on feet  Hemiplegia and hemiparesis following cerebral infarction affecting right dominant side ([place])  Rationale for  Evaluation and Treatment: Rehabilitation  SUBJECTIVE:                                                                                                                                                                                             SUBJECTIVE STATEMENT: I been doing good, working out. No falls and been exercising   Pt accompanied by: significant other  PERTINENT HISTORY: stroke in 2020  PAIN:  Are you having pain? No  PRECAUTIONS: None  WEIGHT BEARING RESTRICTIONS: No  FALLS: Has patient fallen  in last 6 months? No  LIVING ENVIRONMENT: Lives with: lives with their spouse Lives in: House/apartment Stairs: No Has following equipment at home: Single point cane, Environmental consultant - 4 wheeled, Wheelchair (manual), and Ramped entry  PLOF: Independent and Independent with basic ADLs  PATIENT GOALS: I want to get stronger and walk better  OBJECTIVE:   COGNITION: Overall cognitive status: Within functional limits for tasks assessed   SENSATION: WFL  POSTURE: rounded shoulders, forward head, flexed trunk , and weight shift left  LOWER EXTREMITY ROM:     Active  Right Eval Left Eval  Hip flexion Impaired d/t stroke unable to do actively   Hip extension    Hip abduction    Hip adduction    Hip internal rotation    Hip external rotation    Knee flexion Can do actively but uncontrolled   Knee extension WFL   Ankle dorsiflexion Foot drop, wears an AFO   Ankle plantarflexion    Ankle inversion    Ankle eversion     (Blank rows = not tested)  LOWER EXTREMITY MMT:    MMT Right Eval Left Eval  Hip flexion 2+ 5  Hip extension    Hip abduction    Hip adduction    Hip internal rotation    Hip external rotation    Knee flexion 3+   Knee extension 3+ 5  Ankle dorsiflexion    Ankle plantarflexion    Ankle inversion    Ankle eversion    (Blank rows = not tested)  GAIT: Gait pattern: decreased arm swing- Right, decreased stance time- Right, decreased stride length,  decreased hip/knee flexion- Right, decreased ankle dorsiflexion- Right, circumduction- Right, shuffling, scissoring, ataxic, lateral lean- Left, narrow BOS, and poor foot clearance- Right Distance walked: in clinic distances  Assistive device utilized: Single point cane Level of assistance: Modified independence Comments: decreased safety awareness, some toe catching  FUNCTIONAL TESTS:  5 times sit to stand: 14.06s Timed up and go (TUG): 19.47s,  08/30/22 = 13 s   TODAY'S TREATMENT:                                                                                                                              DATE:  09/05/22 NuStep L5 x41mns  5# right leg only leg extension 2x10 10# right only leg curls 2x10 Obstacle course- 4" step up, walk around cones, step up on airex  Ladder drill one foot in each box and then side steps STS 2x10 Leg press 60# both x10, right only 20# 2x5   09/01/22 Nustep level 6 x 6 minutes LE only Volleyball standing Volley ball on airex Elliptical 2 minutes CGA 6" toe touches 5# right leg only leg extension 10# right only leg curls Single arm pulls left 15#, right 5# Chest press 5# with mild assist to wrist right arm only Leg press 60# both 2x10, right only 20# Gait with SPC negotiating over 4" and 6"  steps in the open  1/232/24 Nustep level 6 x 6 minutes TUG no device 13 seconds Volleyball standing Leg extension 5# cues for TKE Leg curls 10# 2 x10 10# left arm pulls , right arm 5# Leg press 20# x10, then right only with some help to protect knee 3x5 reps Lats 15# 2x6 reps Cone toe touches  Cone hand touches on airex 6" toe clears trying to limit circumduction Hip abduction right Fast walking with use of gait belt, no device 4" step negotiating open floor  08/24/22 Nustep LE only level 6 x 6 minutes Bike level 4 x 6 minutes  3x did the power burst Leg curls 35# 2x10, right only 5# 3x5 Leg extension 5# 2x10 Standing volleyball Direction  changes Leg press right only 20# 2x10, then no weight right 2x10 Side stepping over stick In pbars on airex cone hand and toe touches 10# left arm straight arm pull Right only green tband hip ab/adduciton Partial sit ups 2x10  08/22/22 Bike L2 x682mns  NuStep increased resistance and pushing with R side  Leg ext 5# RLE 2x10 HS curls 35# 2x10, RLE 5# x10  Step overs on staircase 4"  Leg press 40# x10, x5 lowering on RLE, x10 20# on RLE 2x5   08/17/22 Gait training with SPC 2 laps  NuStep L5 x67ms LE only  STS on airex 2x10 Leg ext 5# 2x10 RLE only HS curls 35# 2x10, RLE 10# x10 unable to complete full range Leg press 20# 2x10 Hip abduction greenTB 2x10   08/15/22 NuStep L4 x 82m15m  STS 2x10 LAQ 3# RLE 2x10  HS curls green RLE 2x10  Box taps 4" CGA Step ups 4" on stairs  Standing 2 way leg 2# 2x10  2# standing marches 20 reps  2 laps [facility]    PATIENT EDUCATION: Education details: HEP and POC Person educated: Patient Education method: Explanation Education comprehension: verbalized understanding  HOME EXERCISE PROGRAM: Access Code: [email or web address]: [email or web address] Date: 08/03/2022 Prepared by: MonPemiscotth Counter Support  - 1 x daily - 7 x weekly - 2 sets - 10 reps - Standing Hip Extension with Unilateral Counter Support  - 1 x daily - 7 x weekly - 2 sets - 10 reps - Standing Hip Abduction with Unilateral Counter Support  - 1 x daily - 7 x weekly - 2 sets - 10 reps - Seated Hip Adduction Isometrics with Ball  - 1 x daily - 7 x weekly - 2 sets - 10 reps  GOALS: Goals reviewed with patient? Yes  SHORT TERM GOALS: Target date: 09/07/22  Patient will be independent with initial HEP. Goal status: met  2. Patient will improved 5xSTS to <12s Baseline: 14.06s Goal status: progressing   LONG TERM GOALS: Target date: 10/19/22  Patient will be independent with advanced/ongoing HEP to  improve outcomes and carryover.  Goal status: INITIAL  2.  Patient will demonstrate decreased fall risk by scoring < 15 sec on TUG. Baseline: 19.47s Goal status:ongoing  3.  Patient will be able to ambulate 600' with LRAD with good safety to access community.  Baseline: ambulates with SPC and w/o in home Goal status:progressing  4.  Patient will demonstrate improved functional LE strength as demonstrated by 4/5 or better. Goal status: INITIAL  5.  Patient will demonstrate at least 20/30 on DGI to improve gait stability and reduce risk for falls. Baseline: 13/30 Goal status: INITIAL  6.  Patient will demonstrate gait speed of >/= 1.8 ft/sec (0.55 m/s) to be a safe limited community ambulator with decreased risk for recurrent falls.  Baseline: 1.25 ft/sec Goal status: progressing    ASSESSMENT:  CLINICAL IMPRESSION: CGA needed with obstacle course done today, tried one time without cane but it requires minA and feet got tangled when navigating  around cones due to narrow walking pattern and decreased control of R foot placement. Does well with forward steps in ladder, able to take big steps and keep balance. Has more difficulty with side steps when leading with the left but does well coming towards the right. Continue to progress with strength and balance.   OBJECTIVE IMPAIRMENTS: Abnormal gait, decreased balance, decreased mobility, difficulty walking, decreased ROM, decreased strength, decreased safety awareness, increased fascial restrictions, impaired tone, and impaired UE functional use.   REHAB POTENTIAL: Fair chronicity of stroke  CLINICAL DECISION MAKING: Stable/uncomplicated  EVALUATION COMPLEXITY: Low  PLAN:  PT FREQUENCY: 1-2x/week  PT DURATION: 10 weeks  PLANNED INTERVENTIONS: Therapeutic exercises, Therapeutic activity, Neuromuscular re-education, Balance training, Gait training, Patient/Family education, Self Care, Joint mobilization, Stair training, Dry  Needling, Electrical stimulation, Cryotherapy, Moist heat, Ionotophoresis '4mg'$ /ml Dexamethasone, and Manual therapy  PLAN FOR NEXT SESSION: gait training, obstacle course with step ups and airex    Andris Baumann, PT 09/05/2022, 8:45 AM

## 2022-09-01 NOTE — Therapy (Signed)
OUTPATIENT PHYSICAL THERAPY NEURO TREATMENT   Patient Name: Casey Petty MRN: [id number] DOB:Sep 30, 1960, 62 y.o., male Today's Date: 09/01/2022   PCP: Charlott Rakes REFERRING PROVIDER: Charlott Rakes  END OF SESSION:  PT End of Session - 09/01/22 0752     Visit Number 7    Date for PT Re-Evaluation 10/19/22    Authorization Time Period [facility] Medicare    PT Start Time 0752    PT Stop Time 0839    PT Time Calculation (min) 47 min    Activity Tolerance Patient tolerated treatment well    Behavior During Therapy [facility] for tasks assessed/performed               Past Medical History:  Diagnosis Date   Diabetes mellitus without complication ([place])    on meds   Hyperlipidemia    on meds   Hypertension    on meds   Stroke ([place]) 2020   R hemiparesis-abnormal gait   Past Surgical History:  Procedure Laterality Date   WISDOM TOOTH EXTRACTION Bilateral 06/2021   Patient Active Problem List   Diagnosis Date Noted   Chronic right shoulder pain 06/22/2020   Adhesive capsulitis of right shoulder 10/07/2019   Abnormality of gait 09/17/2019   Spastic hemiparesis of right dominant side ([place]) 07/02/2019   History of CVA with residual deficit 06/09/2019   Spastic hemiparesis ([place])    Uncontrolled type 2 diabetes mellitus with hyperglycemia ([place])    Noncompliance    Benign essential HTN    Newly diagnosed diabetes ([place])    Leucocytosis    Right hemiparesis ([place])    Basal ganglia stroke ([place]) 03/04/2019   Stroke ([place]) 02/28/2019   Essential hypertension 02/28/2019   Tobacco abuse 02/28/2019   Hyperglycemia 02/28/2019    ONSET DATE: 2020  REFERRING DIAG: G81.10  THERAPY DIAG:  Spastic hemiparesis of right dominant side as late effect of cerebral infarction (HCC)  Muscle weakness (generalized)  Other abnormalities of gait and mobility  Unsteadiness on feet  Hemiplegia and hemiparesis following cerebral infarction affecting right dominant side (HCC)  Chronic right  shoulder pain  Rationale for Evaluation and Treatment: Rehabilitation  SUBJECTIVE:                                                                                                                                                                                             SUBJECTIVE STATEMENT: No falls, no pain, reports that he feels like the nerves are coming back in the leg, some shooting sensations  Pt accompanied by: significant other  PERTINENT HISTORY: stroke in 2020  PAIN:  Are you having pain? No  PRECAUTIONS: None  WEIGHT BEARING RESTRICTIONS: No  FALLS: Has patient fallen in last 6 months? No  LIVING ENVIRONMENT: Lives with: lives with their spouse Lives in: House/apartment Stairs: No Has following equipment at home: Single point cane, Environmental consultant - 4 wheeled, Wheelchair (manual), and Ramped entry  PLOF: Independent and Independent with basic ADLs  PATIENT GOALS: I want to get stronger and walk better  OBJECTIVE:   COGNITION: Overall cognitive status: Within functional limits for tasks assessed   SENSATION: WFL  POSTURE: rounded shoulders, forward head, flexed trunk , and weight shift left  LOWER EXTREMITY ROM:     Active  Right Eval Left Eval  Hip flexion Impaired d/t stroke unable to do actively   Hip extension    Hip abduction    Hip adduction    Hip internal rotation    Hip external rotation    Knee flexion Can do actively but uncontrolled   Knee extension WFL   Ankle dorsiflexion Foot drop, wears an AFO   Ankle plantarflexion    Ankle inversion    Ankle eversion     (Blank rows = not tested)  LOWER EXTREMITY MMT:    MMT Right Eval Left Eval  Hip flexion 2+ 5  Hip extension    Hip abduction    Hip adduction    Hip internal rotation    Hip external rotation    Knee flexion 3+   Knee extension 3+ 5  Ankle dorsiflexion    Ankle plantarflexion    Ankle inversion    Ankle eversion    (Blank rows = not tested)  GAIT: Gait pattern:  decreased arm swing- Right, decreased stance time- Right, decreased stride length, decreased hip/knee flexion- Right, decreased ankle dorsiflexion- Right, circumduction- Right, shuffling, scissoring, ataxic, lateral lean- Left, narrow BOS, and poor foot clearance- Right Distance walked: in clinic distances  Assistive device utilized: Single point cane Level of assistance: Modified independence Comments: decreased safety awareness, some toe catching  FUNCTIONAL TESTS:  5 times sit to stand: 14.06s Timed up and go (TUG): 19.47s,  08/30/22 = 13 s   TODAY'S TREATMENT:                                                                                                                              DATE:  09/01/22 Nustep level 6 x 6 minutes LE only Volleyball standing Volley ball on airex Elliptical 2 minutes CGA 6" toe touches 5# right leg only leg extension 10# right only leg curls Single arm pulls left 15#, right 5# Chest press 5# with mild assist to wrist right arm only Leg press 60# both 2x10, right only 20# Gait with SPC negotiating over 4" and 6" steps in the open  1/232/24 Nustep level 6 x 6 minutes TUG no device 13 seconds Volleyball standing Leg extension 5# cues for TKE Leg curls 10# 2 x10 10# left arm pulls , right arm 5# Leg press 20# x10, then  right only with some help to protect knee 3x5 reps Lats 15# 2x6 reps Cone toe touches  Cone hand touches on airex 6" toe clears trying to limit circumduction Hip abduction right Fast walking with use of gait belt, no device 4" step negotiating open floor  08/24/22 Nustep LE only level 6 x 6 minutes Bike level 4 x 6 minutes  3x did the power burst Leg curls 35# 2x10, right only 5# 3x5 Leg extension 5# 2x10 Standing volleyball Direction changes Leg press right only 20# 2x10, then no weight right 2x10 Side stepping over stick In pbars on airex cone hand and toe touches 10# left arm straight arm pull Right only green tband hip  ab/adduciton Partial sit ups 2x10  08/22/22 Bike L2 x37mns  NuStep increased resistance and pushing with R side  Leg ext 5# RLE 2x10 HS curls 35# 2x10, RLE 5# x10  Step overs on staircase 4"  Leg press 40# x10, x5 lowering on RLE, x10 20# on RLE 2x5   08/17/22 Gait training with SPC 2 laps  NuStep L5 x614ms LE only  STS on airex 2x10 Leg ext 5# 2x10 RLE only HS curls 35# 2x10, RLE 10# x10 unable to complete full range Leg press 20# 2x10 Hip abduction greenTB 2x10   08/15/22 NuStep L4 x 67m17m  STS 2x10 LAQ 3# RLE 2x10  HS curls green RLE 2x10  Box taps 4" CGA Step ups 4" on stairs  Standing 2 way leg 2# 2x10  2# standing marches 20 reps  2 laps [facility]    PATIENT EDUCATION: Education details: HEP and POC Person educated: Patient Education method: Explanation Education comprehension: verbalized understanding  HOME EXERCISE PROGRAM: Access Code: [email or web address]: [email or web address] Date: 08/03/2022 Prepared by: [place] Counter Support  - 1 x daily - 7 x weekly - 2 sets - 10 reps - Standing Hip Extension with Unilateral Counter Support  - 1 x daily - 7 x weekly - 2 sets - 10 reps - Standing Hip Abduction with Unilateral Counter Support  - 1 x daily - 7 x weekly - 2 sets - 10 reps - Seated Hip Adduction Isometrics with Ball  - 1 x daily - 7 x weekly - 2 sets - 10 reps  GOALS: Goals reviewed with patient? Yes  SHORT TERM GOALS: Target date: 09/07/22  Patient will be independent with initial HEP. Goal status: met  2. Patient will improved 5xSTS to <12s Baseline: 14.06s Goal status: progressing   LONG TERM GOALS: Target date: 10/19/22  Patient will be independent with advanced/ongoing HEP to improve outcomes and carryover.  Goal status: INITIAL  2.  Patient will demonstrate decreased fall risk by scoring < 15 sec on TUG. Baseline: 19.47s Goal status:ongoing  3.  Patient will be able  to ambulate 600' with LRAD with good safety to access community.  Baseline: ambulates with SPC and w/o in home Goal status:progressing  4.  Patient will demonstrate improved functional LE strength as demonstrated by 4/5 or better. Goal status: INITIAL  5.  Patient will demonstrate at least 20/30 on DGI to improve gait stability and reduce risk for falls. Baseline: 13/30 Goal status: INITIAL  6.  Patient will demonstrate gait speed of >/= 1.8 ft/sec (0.55 m/s) to be a safe limited community ambulator with decreased risk for recurrent falls.  Baseline: 1.25 ft/sec Goal status: progressing    ASSESSMENT:  CLINICAL IMPRESSION: Added elliptical for 2  minutes, this was very difficult for him, very fatigued.  He overall is doing well, does not have right hip flexor so he tends to circumduct the leg, I can feel the hip flexor firing but there are some other mm synergies that do not allow the true hip flexion  OBJECTIVE IMPAIRMENTS: Abnormal gait, decreased balance, decreased mobility, difficulty walking, decreased ROM, decreased strength, decreased safety awareness, increased fascial restrictions, impaired tone, and impaired UE functional use.   REHAB POTENTIAL: Fair chronicity of stroke  CLINICAL DECISION MAKING: Stable/uncomplicated  EVALUATION COMPLEXITY: Low  PLAN:  PT FREQUENCY: 1-2x/week  PT DURATION: 10 weeks  PLANNED INTERVENTIONS: Therapeutic exercises, Therapeutic activity, Neuromuscular re-education, Balance training, Gait training, Patient/Family education, Self Care, Joint mobilization, Stair training, Dry Needling, Electrical stimulation, Cryotherapy, Moist heat, Ionotophoresis '4mg'$ /ml Dexamethasone, and Manual therapy  PLAN FOR NEXT SESSION: gait training, obstacle course with step ups and airex    Leia Coletti W, PT 09/01/2022, 7:55 AM

## 2022-09-05 ENCOUNTER — Ambulatory Visit: Payer: Medicare HMO

## 2022-09-05 DIAGNOSIS — I69351 Hemiplegia and hemiparesis following cerebral infarction affecting right dominant side: Secondary | ICD-10-CM

## 2022-09-05 DIAGNOSIS — R2689 Other abnormalities of gait and mobility: Secondary | ICD-10-CM | POA: Diagnosis not present

## 2022-09-05 DIAGNOSIS — G8929 Other chronic pain: Secondary | ICD-10-CM | POA: Diagnosis not present

## 2022-09-05 DIAGNOSIS — R2681 Unsteadiness on feet: Secondary | ICD-10-CM | POA: Diagnosis not present

## 2022-09-05 DIAGNOSIS — M6281 Muscle weakness (generalized): Secondary | ICD-10-CM

## 2022-09-05 DIAGNOSIS — M25511 Pain in right shoulder: Secondary | ICD-10-CM | POA: Diagnosis not present

## 2022-09-07 ENCOUNTER — Encounter: Payer: Self-pay | Admitting: Physical Therapy

## 2022-09-07 ENCOUNTER — Ambulatory Visit: Payer: Medicare HMO | Admitting: Physical Therapy

## 2022-09-07 DIAGNOSIS — M6281 Muscle weakness (generalized): Secondary | ICD-10-CM | POA: Diagnosis not present

## 2022-09-07 DIAGNOSIS — I69351 Hemiplegia and hemiparesis following cerebral infarction affecting right dominant side: Secondary | ICD-10-CM | POA: Diagnosis not present

## 2022-09-07 DIAGNOSIS — R2689 Other abnormalities of gait and mobility: Secondary | ICD-10-CM | POA: Diagnosis not present

## 2022-09-07 DIAGNOSIS — M25511 Pain in right shoulder: Secondary | ICD-10-CM | POA: Diagnosis not present

## 2022-09-07 DIAGNOSIS — G8929 Other chronic pain: Secondary | ICD-10-CM | POA: Diagnosis not present

## 2022-09-07 DIAGNOSIS — R2681 Unsteadiness on feet: Secondary | ICD-10-CM | POA: Diagnosis not present

## 2022-09-07 NOTE — Therapy (Signed)
OUTPATIENT PHYSICAL THERAPY NEURO TREATMENT   Patient Name: Casey Petty MRN: [id number] DOB:1960-10-17, 62 y.o., male Today's Date: 09/07/2022   PCP: Charlott Rakes REFERRING PROVIDER: Charlott Rakes  END OF SESSION:  PT End of Session - 09/07/22 0748     Visit Number 9    Date for PT Re-Evaluation 10/19/22    Authorization Time Period [facility] Medicare    PT Start Time [phone]    PT Stop Time 0838    PT Time Calculation (min) 50 min    Activity Tolerance Patient tolerated treatment well    Behavior During Therapy [facility] for tasks assessed/performed               Past Medical History:  Diagnosis Date   Diabetes mellitus without complication ([place])    on meds   Hyperlipidemia    on meds   Hypertension    on meds   Stroke ([place]) 2020   R hemiparesis-abnormal gait   Past Surgical History:  Procedure Laterality Date   WISDOM TOOTH EXTRACTION Bilateral 06/2021   Patient Active Problem List   Diagnosis Date Noted   Chronic right shoulder pain 06/22/2020   Adhesive capsulitis of right shoulder 10/07/2019   Abnormality of gait 09/17/2019   Spastic hemiparesis of right dominant side ([place]) 07/02/2019   History of CVA with residual deficit 06/09/2019   Spastic hemiparesis ([place])    Uncontrolled type 2 diabetes mellitus with hyperglycemia ([place])    Noncompliance    Benign essential HTN    Newly diagnosed diabetes ([place])    Leucocytosis    Right hemiparesis ([place])    Basal ganglia stroke ([place]) 03/04/2019   Stroke ([place]) 02/28/2019   Essential hypertension 02/28/2019   Tobacco abuse 02/28/2019   Hyperglycemia 02/28/2019    ONSET DATE: 2020  REFERRING DIAG: G81.10  THERAPY DIAG:  Spastic hemiparesis of right dominant side as late effect of cerebral infarction (HCC)  Muscle weakness (generalized)  Other abnormalities of gait and mobility  Rationale for Evaluation and Treatment: Rehabilitation  SUBJECTIVE:                                                                                                                                                                                              SUBJECTIVE STATEMENT: I think I am getting stronger, I am moving better, no falls, I walk some at home without the brace on Pt accompanied by: significant other  PERTINENT HISTORY: stroke in 2020  PAIN:  Are you having pain? No  PRECAUTIONS: None  WEIGHT BEARING RESTRICTIONS: No  FALLS: Has patient fallen in last 6 months? No  LIVING ENVIRONMENT:  Lives with: lives with their spouse Lives in: House/apartment Stairs: No Has following equipment at home: Single point cane, Environmental consultant - 4 wheeled, Wheelchair (manual), and Ramped entry  PLOF: Independent and Independent with basic ADLs  PATIENT GOALS: I want to get stronger and walk better  OBJECTIVE:   COGNITION: Overall cognitive status: Within functional limits for tasks assessed   SENSATION: WFL  POSTURE: rounded shoulders, forward head, flexed trunk , and weight shift left  LOWER EXTREMITY ROM:     Active  Right Eval Left Eval  Hip flexion Impaired d/t stroke unable to do actively   Hip extension    Hip abduction    Hip adduction    Hip internal rotation    Hip external rotation    Knee flexion Can do actively but uncontrolled   Knee extension WFL   Ankle dorsiflexion Foot drop, wears an AFO   Ankle plantarflexion    Ankle inversion    Ankle eversion     (Blank rows = not tested)  LOWER EXTREMITY MMT:    MMT Right Eval Left Eval  Hip flexion 2+ 5  Hip extension    Hip abduction    Hip adduction    Hip internal rotation    Hip external rotation    Knee flexion 3+   Knee extension 3+ 5  Ankle dorsiflexion    Ankle plantarflexion    Ankle inversion    Ankle eversion    (Blank rows = not tested)  GAIT: Gait pattern: decreased arm swing- Right, decreased stance time- Right, decreased stride length, decreased hip/knee flexion- Right, decreased ankle dorsiflexion- Right,  circumduction- Right, shuffling, scissoring, ataxic, lateral lean- Left, narrow BOS, and poor foot clearance- Right Distance walked: in clinic distances  Assistive device utilized: Single point cane Level of assistance: Modified independence Comments: decreased safety awareness, some toe catching  FUNCTIONAL TESTS:  5 times sit to stand: 14.06s Timed up and go (TUG): 19.47s,  08/30/22 = 13 s   TODAY'S TREATMENT:                                                                                                                              DATE:  09/07/22 Nustep level 5 LE only x 6 minutes Figure 8's walking on the mat for balance Obstacle course working on step ups and negotiating around [employer] and then alternating kicks In Pbars airex beam side stepping with various hands to no hands Minitramp bounce and march Leg extension 5# right only 2x10 Leg curls 10# right only 2x10 Feet on ball K2C, trunk rotation, bridges, isometric abs 15# left and right arm pulls for core activation and balance 60# leg press both legs  09/05/22 NuStep L5 x109mns  5# right leg only leg extension 2x10 10# right only leg curls 2x10 Obstacle course- 4" step up, walk around cones, step up on airex  Ladder drill one foot in each box and then side steps STS  2x10 Leg press 60# both x10, right only 20# 2x5  09/01/22 Nustep level 6 x 6 minutes LE only Volleyball standing Volley ball on airex Elliptical 2 minutes CGA 6" toe touches 5# right leg only leg extension 10# right only leg curls Single arm pulls left 15#, right 5# Chest press 5# with mild assist to wrist right arm only Leg press 60# both 2x10, right only 20# Gait with SPC negotiating over 4" and 6" steps in the open  1/232/24 Nustep level 6 x 6 minutes TUG no device 13 seconds Volleyball standing Leg extension 5# cues for TKE Leg curls 10# 2 x10 10# left arm pulls , right arm 5# Leg press 20# x10, then right only with some  help to protect knee 3x5 reps Lats 15# 2x6 reps Cone toe touches  Cone hand touches on airex 6" toe clears trying to limit circumduction Hip abduction right Fast walking with use of gait belt, no device 4" step negotiating open floor  08/24/22 Nustep LE only level 6 x 6 minutes Bike level 4 x 6 minutes  3x did the power burst Leg curls 35# 2x10, right only 5# 3x5 Leg extension 5# 2x10 Standing volleyball Direction changes Leg press right only 20# 2x10, then no weight right 2x10 Side stepping over stick In pbars on airex cone hand and toe touches 10# left arm straight arm pull Right only green tband hip ab/adduciton Partial sit ups 2x10  08/22/22 Bike L2 x72mns  NuStep increased resistance and pushing with R side  Leg ext 5# RLE 2x10 HS curls 35# 2x10, RLE 5# x10  Step overs on staircase 4"  Leg press 40# x10, x5 lowering on RLE, x10 20# on RLE 2x5  PATIENT EDUCATION: Education details: HEP and POC Person educated: Patient Education method: Explanation Education comprehension: verbalized understanding  HOME EXERCISE PROGRAM: Access Code: [email or web address]: [email or web address] Date: 08/03/2022 Prepared by: MJim Thorpewith Counter Support  - 1 x daily - 7 x weekly - 2 sets - 10 reps - Standing Hip Extension with Unilateral Counter Support  - 1 x daily - 7 x weekly - 2 sets - 10 reps - Standing Hip Abduction with Unilateral Counter Support  - 1 x daily - 7 x weekly - 2 sets - 10 reps - Seated Hip Adduction Isometrics with Ball  - 1 x daily - 7 x weekly - 2 sets - 10 reps  GOALS: Goals reviewed with patient? Yes  SHORT TERM GOALS: Target date: 09/07/22  Patient will be independent with initial HEP. Goal status: met  2. Patient will improved 5xSTS to <12s Baseline: 14.06s Goal status: progressing   LONG TERM GOALS: Target date: 10/19/22  Patient will be independent with advanced/ongoing HEP to improve outcomes and carryover.   Goal status: INITIAL  2.  Patient will demonstrate decreased fall risk by scoring < 15 sec on TUG. Baseline: 19.47s Goal status:ongoing  3.  Patient will be able to ambulate 600' with LRAD with good safety to access community.  Baseline: ambulates with SPC and w/o in home Goal status:progressing  4.  Patient will demonstrate improved functional LE strength as demonstrated by 4/5 or better. Goal status: ongoing  5.  Patient will demonstrate at least 20/30 on DGI to improve gait stability and reduce risk for falls. Baseline: 13/30 Goal status: INITIAL  6.  Patient will demonstrate gait speed of >/= 1.8 ft/sec (0.55 m/s) to be a safe limited community ambulator with decreased risk for  recurrent falls.  Baseline: 1.25 ft/sec Goal status: progressing    ASSESSMENT:  CLINICAL IMPRESSION: Patient does require some CGA for the higher level balance and due to some impulsivity.  He works hard, I added a few higher level balance activities and worked on strength and some core work  OBJECTIVE IMPAIRMENTS: Abnormal gait, decreased balance, decreased mobility, difficulty walking, decreased ROM, decreased strength, decreased safety awareness, increased fascial restrictions, impaired tone, and impaired UE functional use.   REHAB POTENTIAL: Fair chronicity of stroke  CLINICAL DECISION MAKING: Stable/uncomplicated  EVALUATION COMPLEXITY: Low  PLAN:  PT FREQUENCY: 1-2x/week  PT DURATION: 10 weeks  PLANNED INTERVENTIONS: Therapeutic exercises, Therapeutic activity, Neuromuscular re-education, Balance training, Gait training, Patient/Family education, Self Care, Joint mobilization, Stair training, Dry Needling, Electrical stimulation, Cryotherapy, Moist heat, Ionotophoresis '4mg'$ /ml Dexamethasone, and Manual therapy  PLAN FOR NEXT SESSION: gait training, obstacle course with step ups and airex    Larson Limones W, PT 09/07/2022, 7:49 AM

## 2022-09-15 NOTE — Therapy (Signed)
OUTPATIENT PHYSICAL THERAPY NEURO TREATMENT  Progress Note Reporting Period 08/03/22 to 09/16/22  See note below for Objective Data and Assessment of Progress/Goals.     Patient Name: Casey Petty MRN: [id number] DOB:1961/06/03, 62 y.o., male Today's Date: 09/16/2022   PCP: Charlott Rakes REFERRING PROVIDER: Charlott Rakes  END OF SESSION:  PT End of Session - 09/16/22 0930     Visit Number 10    Date for PT Re-Evaluation 10/19/22    Authorization Time Period [employer]    PT Start Time 0930    PT Stop Time 1015    PT Time Calculation (min) 45 min    Activity Tolerance Patient tolerated treatment well    Behavior During Therapy WFL for tasks assessed/performed                Past Medical History:  Diagnosis Date   Diabetes mellitus without complication ([place])    on meds   Hyperlipidemia    on meds   Hypertension    on meds   Stroke ([place]) 2020   R hemiparesis-abnormal gait   Past Surgical History:  Procedure Laterality Date   WISDOM TOOTH EXTRACTION Bilateral 06/2021   Patient Active Problem List   Diagnosis Date Noted   Chronic right shoulder pain 06/22/2020   Adhesive capsulitis of right shoulder 10/07/2019   Abnormality of gait 09/17/2019   Spastic hemiparesis of right dominant side ([place]) 07/02/2019   History of CVA with residual deficit 06/09/2019   Spastic hemiparesis ([place])    Uncontrolled type 2 diabetes mellitus with hyperglycemia ([place])    Noncompliance    Benign essential HTN    Newly diagnosed diabetes ([place])    Leucocytosis    Right hemiparesis ([place])    Basal ganglia stroke ([place]) 03/04/2019   Stroke ([place]) 02/28/2019   Essential hypertension 02/28/2019   Tobacco abuse 02/28/2019   Hyperglycemia 02/28/2019    ONSET DATE: 2020  REFERRING DIAG: G81.10  THERAPY DIAG:  Spastic hemiparesis of right dominant side as late effect of cerebral infarction ([facility])  Muscle weakness (generalized)  Other abnormalities of gait and  mobility  Unsteadiness on feet  Hemiplegia and hemiparesis following cerebral infarction affecting right dominant side ([place])  Rationale for Evaluation and Treatment: Rehabilitation  SUBJECTIVE:                                                                                                                                                                                             SUBJECTIVE STATEMENT: Good, no falling or nothing.  Pt accompanied by: significant other  PERTINENT HISTORY: stroke in 2020  PAIN:  Are you having  pain? No  PRECAUTIONS: None  WEIGHT BEARING RESTRICTIONS: No  FALLS: Has patient fallen in last 6 months? No  LIVING ENVIRONMENT: Lives with: lives with their spouse Lives in: House/apartment Stairs: No Has following equipment at home: Single point cane, Environmental consultant - 4 wheeled, Wheelchair (manual), and Ramped entry  PLOF: Independent and Independent with basic ADLs  PATIENT GOALS: I want to get stronger and walk better  OBJECTIVE:   COGNITION: Overall cognitive status: Within functional limits for tasks assessed   SENSATION: WFL  POSTURE: rounded shoulders, forward head, flexed trunk , and weight shift left  LOWER EXTREMITY ROM:     Active  Right Eval Left Eval  Hip flexion Impaired d/t stroke unable to do actively   Hip extension    Hip abduction    Hip adduction    Hip internal rotation    Hip external rotation    Knee flexion Can do actively but uncontrolled   Knee extension WFL   Ankle dorsiflexion Foot drop, wears an AFO   Ankle plantarflexion    Ankle inversion    Ankle eversion     (Blank rows = not tested)  LOWER EXTREMITY MMT:    MMT Right Eval Left Eval  Hip flexion 2+ 5  Hip extension    Hip abduction    Hip adduction    Hip internal rotation    Hip external rotation    Knee flexion 3+   Knee extension 3+ 5  Ankle dorsiflexion    Ankle plantarflexion    Ankle inversion    Ankle eversion    (Blank rows = not  tested)  GAIT: Gait pattern: decreased arm swing- Right, decreased stance time- Right, decreased stride length, decreased hip/knee flexion- Right, decreased ankle dorsiflexion- Right, circumduction- Right, shuffling, scissoring, ataxic, lateral lean- Left, narrow BOS, and poor foot clearance- Right Distance walked: in clinic distances  Assistive device utilized: Single point cane Level of assistance: Modified independence Comments: decreased safety awareness, some toe catching  FUNCTIONAL TESTS:  5 times sit to stand: 14.06s Timed up and go (TUG): 19.47s,  08/30/22 = 13 s   TODAY'S TREATMENT:                                                                                                                              DATE:  09/16/22 Progress note NuStep L5 x40mns Box taps 6" standing on airex- holding SPC, CGA Calf stretch on bar 30s Calf raises 2x10 on bar  HS curls 15# 2x10 RLE Leg ext 5# RLE 2x10 Leg press 60# x10, 20# x10 RLE only   09/07/22 Nustep level 5 LE only x 6 minutes Figure 8's walking on the mat for balance Obstacle course working on step ups and negotiating around oCon-wayand then alternating kicks In Pbars airex beam side stepping with various hands to no hands Minitramp bounce and march Leg extension 5# right only 2x10 Leg curls 10# right only 2x10  Feet on ball K2C, trunk rotation, bridges, isometric abs 15# left and right arm pulls for core activation and balance 60# leg press both legs  09/05/22 NuStep L5 x4mns  5# right leg only leg extension 2x10 10# right only leg curls 2x10 Obstacle course- 4" step up, walk around cones, step up on airex  Ladder drill one foot in each box and then side steps STS 2x10 Leg press 60# both x10, right only 20# 2x5  09/01/22 Nustep level 6 x 6 minutes LE only Volleyball standing Volley ball on airex Elliptical 2 minutes CGA 6" toe touches 5# right leg only leg extension 10# right only leg  curls Single arm pulls left 15#, right 5# Chest press 5# with mild assist to wrist right arm only Leg press 60# both 2x10, right only 20# Gait with SPC negotiating over 4" and 6" steps in the open  1/232/24 Nustep level 6 x 6 minutes TUG no device 13 seconds Volleyball standing Leg extension 5# cues for TKE Leg curls 10# 2 x10 10# left arm pulls , right arm 5# Leg press 20# x10, then right only with some help to protect knee 3x5 reps Lats 15# 2x6 reps Cone toe touches  Cone hand touches on airex 6" toe clears trying to limit circumduction Hip abduction right Fast walking with use of gait belt, no device 4" step negotiating open floor  08/24/22 Nustep LE only level 6 x 6 minutes Bike level 4 x 6 minutes  3x did the power burst Leg curls 35# 2x10, right only 5# 3x5 Leg extension 5# 2x10 Standing volleyball Direction changes Leg press right only 20# 2x10, then no weight right 2x10 Side stepping over stick In pbars on airex cone hand and toe touches 10# left arm straight arm pull Right only green tband hip ab/adduciton Partial sit ups 2x10  08/22/22 Bike L2 x560ms  NuStep increased resistance and pushing with R side  Leg ext 5# RLE 2x10 HS curls 35# 2x10, RLE 5# x10  Step overs on staircase 4"  Leg press 40# x10, x5 lowering on RLE, x10 20# on RLE 2x5  PATIENT EDUCATION: Education details: HEP and POC Person educated: Patient Education method: Explanation Education comprehension: verbalized understanding  HOME EXERCISE PROGRAM: Access Code: [email or web address]: [email or web address] Date: 08/03/2022 Prepared by: Mo[place] Counter Support  - 1 x daily - 7 x weekly - 2 sets - 10 reps - Standing Hip Extension with Unilateral Counter Support  - 1 x daily - 7 x weekly - 2 sets - 10 reps - Standing Hip Abduction with Unilateral Counter Support  - 1 x daily - 7 x weekly - 2 sets - 10 reps - Seated Hip Adduction Isometrics with Ball   - 1 x daily - 7 x weekly - 2 sets - 10 reps  GOALS: Goals reviewed with patient? Yes  SHORT TERM GOALS: Target date: 09/07/22  Patient will be independent with initial HEP. Goal status: met  2. Patient will improved 5xSTS to <12s Baseline: 14.06s, 9.87s-09/16/22 Goal status: MET   LONG TERM GOALS: Target date: 10/19/22  Patient will be independent with advanced/ongoing HEP to improve outcomes and carryover.  Goal status: INITIAL  2.  Patient will demonstrate decreased fall risk by scoring < 15 sec on TUG. Baseline: 19.47s, 13.49s-09/16/22 Goal status: MET  3.  Patient will be able to ambulate 600' with LRAD with good safety to access community.  Baseline: ambulates with SPC and w/o in  home Goal status:progressing  4.  Patient will demonstrate improved functional LE strength as demonstrated by 4/5 or better. Goal status: ongoing  5.  Patient will demonstrate at least 20/30 on FGA to improve gait stability and reduce risk for falls. Baseline: 13/30, 16/30- 09/16/22 Goal status: IN PROGRESS  6.  Patient will demonstrate gait speed of >/= 1.8 ft/sec (0.55 m/s) to be a safe limited community ambulator with decreased risk for recurrent falls.  Baseline: 1.25 ft/sec, 1.48 ft/sec Goal status: progressing    ASSESSMENT:  CLINICAL IMPRESSION: Patient has made some progress towards his long term goals, met functional testing goals for 5xSTS and TUG. Some difficulty still with foot placement of RLE, unable to do box taps standing on airex without holding on.   OBJECTIVE IMPAIRMENTS: Abnormal gait, decreased balance, decreased mobility, difficulty walking, decreased ROM, decreased strength, decreased safety awareness, increased fascial restrictions, impaired tone, and impaired UE functional use.   REHAB POTENTIAL: Fair chronicity of stroke  CLINICAL DECISION MAKING: Stable/uncomplicated  EVALUATION COMPLEXITY: Low  PLAN:  PT FREQUENCY: 1-2x/week  PT DURATION: 10 weeks  PLANNED  INTERVENTIONS: Therapeutic exercises, Therapeutic activity, Neuromuscular re-education, Balance training, Gait training, Patient/Family education, Self Care, Joint mobilization, Stair training, Dry Needling, Electrical stimulation, Cryotherapy, Moist heat, Ionotophoresis 57m/ml Dexamethasone, and Manual therapy  PLAN FOR NEXT SESSION: gait training, obstacle course with step ups and airex    MAndris Baumann PT 09/16/2022, 10:24 AM

## 2022-09-16 ENCOUNTER — Ambulatory Visit: Payer: Medicare HMO | Attending: Family Medicine

## 2022-09-16 DIAGNOSIS — M6281 Muscle weakness (generalized): Secondary | ICD-10-CM | POA: Insufficient documentation

## 2022-09-16 DIAGNOSIS — G8929 Other chronic pain: Secondary | ICD-10-CM | POA: Insufficient documentation

## 2022-09-16 DIAGNOSIS — M25511 Pain in right shoulder: Secondary | ICD-10-CM | POA: Diagnosis not present

## 2022-09-16 DIAGNOSIS — I69351 Hemiplegia and hemiparesis following cerebral infarction affecting right dominant side: Secondary | ICD-10-CM | POA: Insufficient documentation

## 2022-09-16 DIAGNOSIS — R2681 Unsteadiness on feet: Secondary | ICD-10-CM | POA: Insufficient documentation

## 2022-09-16 DIAGNOSIS — R2689 Other abnormalities of gait and mobility: Secondary | ICD-10-CM | POA: Diagnosis not present

## 2022-09-19 ENCOUNTER — Ambulatory Visit: Payer: Medicare HMO

## 2022-09-19 DIAGNOSIS — G8929 Other chronic pain: Secondary | ICD-10-CM | POA: Diagnosis not present

## 2022-09-19 DIAGNOSIS — I69351 Hemiplegia and hemiparesis following cerebral infarction affecting right dominant side: Secondary | ICD-10-CM

## 2022-09-19 DIAGNOSIS — M6281 Muscle weakness (generalized): Secondary | ICD-10-CM | POA: Diagnosis not present

## 2022-09-19 DIAGNOSIS — R2689 Other abnormalities of gait and mobility: Secondary | ICD-10-CM | POA: Diagnosis not present

## 2022-09-19 DIAGNOSIS — R2681 Unsteadiness on feet: Secondary | ICD-10-CM

## 2022-09-19 DIAGNOSIS — M25511 Pain in right shoulder: Secondary | ICD-10-CM | POA: Diagnosis not present

## 2022-09-19 NOTE — Therapy (Signed)
OUTPATIENT PHYSICAL THERAPY NEURO TREATMENT  Patient Name: Casey Petty MRN: [id number] DOB:September 21, 1960, 62 y.o., male Today's Date: 09/19/2022   PCP: Charlott Rakes REFERRING PROVIDER: Charlott Rakes  END OF SESSION:  PT End of Session - 09/19/22 0757     Visit Number 11    Date for PT Re-Evaluation 10/19/22    Authorization Time Period [facility] Medicare    PT Start Time [phone]    PT Stop Time 0845    PT Time Calculation (min) 47 min    Activity Tolerance Patient tolerated treatment well    Behavior During Therapy [facility] for tasks assessed/performed                Past Medical History:  Diagnosis Date   Diabetes mellitus without complication ([place])    on meds   Hyperlipidemia    on meds   Hypertension    on meds   Stroke ([place]) 2020   R hemiparesis-abnormal gait   Past Surgical History:  Procedure Laterality Date   WISDOM TOOTH EXTRACTION Bilateral 06/2021   Patient Active Problem List   Diagnosis Date Noted   Chronic right shoulder pain 06/22/2020   Adhesive capsulitis of right shoulder 10/07/2019   Abnormality of gait 09/17/2019   Spastic hemiparesis of right dominant side ([place]) 07/02/2019   History of CVA with residual deficit 06/09/2019   Spastic hemiparesis ([place])    Uncontrolled type 2 diabetes mellitus with hyperglycemia ([place])    Noncompliance    Benign essential HTN    Newly diagnosed diabetes ([place])    Leucocytosis    Right hemiparesis ([place])    Basal ganglia stroke ([place]) 03/04/2019   Stroke ([place]) 02/28/2019   Essential hypertension 02/28/2019   Tobacco abuse 02/28/2019   Hyperglycemia 02/28/2019    ONSET DATE: 2020  REFERRING DIAG: G81.10  THERAPY DIAG:  Spastic hemiparesis of right dominant side as late effect of cerebral infarction ([place])  Other abnormalities of gait and mobility  Muscle weakness (generalized)  Unsteadiness on feet  Rationale for Evaluation and Treatment: Rehabilitation  SUBJECTIVE:                                                                                                                                                                                              SUBJECTIVE STATEMENT: Good, no falling or nothing.  Pt accompanied by: significant other  PERTINENT HISTORY: stroke in 2020  PAIN:  Are you having pain? No  PRECAUTIONS: None  WEIGHT BEARING RESTRICTIONS: No  FALLS: Has patient fallen in last 6 months? No  LIVING ENVIRONMENT: Lives with: lives with their spouse Lives in: House/apartment Stairs: No  Has following equipment at home: Single point cane, Walker - 4 wheeled, Wheelchair (manual), and Ramped entry  PLOF: Independent and Independent with basic ADLs  PATIENT GOALS: I want to get stronger and walk better  OBJECTIVE:   COGNITION: Overall cognitive status: Within functional limits for tasks assessed   SENSATION: WFL  POSTURE: rounded shoulders, forward head, flexed trunk , and weight shift left  LOWER EXTREMITY ROM:     Active  Right Eval Left Eval  Hip flexion Impaired d/t stroke unable to do actively   Hip extension    Hip abduction    Hip adduction    Hip internal rotation    Hip external rotation    Knee flexion Can do actively but uncontrolled   Knee extension WFL   Ankle dorsiflexion Foot drop, wears an AFO   Ankle plantarflexion    Ankle inversion    Ankle eversion     (Blank rows = not tested)  LOWER EXTREMITY MMT:    MMT Right Eval Left Eval  Hip flexion 2+ 5  Hip extension    Hip abduction    Hip adduction    Hip internal rotation    Hip external rotation    Knee flexion 3+   Knee extension 3+ 5  Ankle dorsiflexion    Ankle plantarflexion    Ankle inversion    Ankle eversion    (Blank rows = not tested)  GAIT: Gait pattern: decreased arm swing- Right, decreased stance time- Right, decreased stride length, decreased hip/knee flexion- Right, decreased ankle dorsiflexion- Right, circumduction- Right, shuffling, scissoring, ataxic,  lateral lean- Left, narrow BOS, and poor foot clearance- Right Distance walked: in clinic distances  Assistive device utilized: Single point cane Level of assistance: Modified independence Comments: decreased safety awareness, some toe catching  FUNCTIONAL TESTS:  5 times sit to stand: 14.06s Timed up and go (TUG): 19.47s,  08/30/22 = 13 s   TODAY'S TREATMENT:                                                                                                                              DATE:  09/19/22 NuStep L5 x79mns Obstacles course- stepping over half foam and rolled up mat, step up 4", step up on airex  Volleyball hits standing Volleyball kicks x5 each leg  STS on airex 2x10 LAQ 5# 2x10 HS curls blue 2x10 Leg press 60# 2x10, 20# RLE x10   09/16/22 Progress note NuStep L5 x671ms Box taps 6" standing on airex- holding SPC, CGA Calf stretch on bar 30s Calf raises 2x10 on bar  HS curls 15# 2x10 RLE Leg ext 5# RLE 2x10 Leg press 60# x10, 20# x10 RLE only   09/07/22 Nustep level 5 LE only x 6 minutes Figure 8's walking on the mat for balance Obstacle course working on step ups and negotiating around obCon-waynd then alternating kicks In Pbars airex beam side stepping with various hands to no  hands Minitramp bounce and march Leg extension 5# right only 2x10 Leg curls 10# right only 2x10 Feet on ball K2C, trunk rotation, bridges, isometric abs 15# left and right arm pulls for core activation and balance 60# leg press both legs  09/05/22 NuStep L5 x38mns  5# right leg only leg extension 2x10 10# right only leg curls 2x10 Obstacle course- 4" step up, walk around cones, step up on airex  Ladder drill one foot in each box and then side steps STS 2x10 Leg press 60# both x10, right only 20# 2x5  09/01/22 Nustep level 6 x 6 minutes LE only Volleyball standing Volley ball on airex Elliptical 2 minutes CGA 6" toe touches 5# right leg only leg extension 10#  right only leg curls Single arm pulls left 15#, right 5# Chest press 5# with mild assist to wrist right arm only Leg press 60# both 2x10, right only 20# Gait with SPC negotiating over 4" and 6" steps in the open  1/232/24 Nustep level 6 x 6 minutes TUG no device 13 seconds Volleyball standing Leg extension 5# cues for TKE Leg curls 10# 2 x10 10# left arm pulls , right arm 5# Leg press 20# x10, then right only with some help to protect knee 3x5 reps Lats 15# 2x6 reps Cone toe touches  Cone hand touches on airex 6" toe clears trying to limit circumduction Hip abduction right Fast walking with use of gait belt, no device 4" step negotiating open floor  08/24/22 Nustep LE only level 6 x 6 minutes Bike level 4 x 6 minutes  3x did the power burst Leg curls 35# 2x10, right only 5# 3x5 Leg extension 5# 2x10 Standing volleyball Direction changes Leg press right only 20# 2x10, then no weight right 2x10 Side stepping over stick In pbars on airex cone hand and toe touches 10# left arm straight arm pull Right only green tband hip ab/adduciton Partial sit ups 2x10  08/22/22 Bike L2 x565ms  NuStep increased resistance and pushing with R side  Leg ext 5# RLE 2x10 HS curls 35# 2x10, RLE 5# x10  Step overs on staircase 4"  Leg press 40# x10, x5 lowering on RLE, x10 20# on RLE 2x5  PATIENT EDUCATION: Education details: HEP and POC Person educated: Patient Education method: Explanation Education comprehension: verbalized understanding  HOME EXERCISE PROGRAM: Access Code: [email or web address]: [email or web address] Date: 08/03/2022 Prepared by: Mo[place]ith Counter Support  - 1 x daily - 7 x weekly - 2 sets - 10 reps - Standing Hip Extension with Unilateral Counter Support  - 1 x daily - 7 x weekly - 2 sets - 10 reps - Standing Hip Abduction with Unilateral Counter Support  - 1 x daily - 7 x weekly - 2 sets - 10 reps - Seated Hip Adduction  Isometrics with Ball  - 1 x daily - 7 x weekly - 2 sets - 10 reps  GOALS: Goals reviewed with patient? Yes  SHORT TERM GOALS: Target date: 09/07/22  Patient will be independent with initial HEP. Goal status: met  2. Patient will improved 5xSTS to <12s Baseline: 14.06s, 9.87s-09/16/22 Goal status: MET   LONG TERM GOALS: Target date: 10/19/22  Patient will be independent with advanced/ongoing HEP to improve outcomes and carryover.  Goal status: INITIAL  2.  Patient will demonstrate decreased fall risk by scoring < 15 sec on TUG. Baseline: 19.47s, 13.49s-09/16/22 Goal status: MET  3.  Patient will be able to ambulate  600' with LRAD with good safety to access community.  Baseline: ambulates with SPC and w/o in home Goal status:progressing  4.  Patient will demonstrate improved functional LE strength as demonstrated by 4/5 or better. Goal status: ongoing  5.  Patient will demonstrate at least 20/30 on FGA to improve gait stability and reduce risk for falls. Baseline: 13/30, 16/30- 09/16/22 Goal status: IN PROGRESS  6.  Patient will demonstrate gait speed of >/= 1.8 ft/sec (0.55 m/s) to be a safe limited community ambulator with decreased risk for recurrent falls.  Baseline: 1.25 ft/sec, 1.48 ft/sec Goal status: progressing    ASSESSMENT:  CLINICAL IMPRESSION: Difficulty with obstacle course today due to L foot catching, he is unable to clear obstacles because he circumducts his RLE. Loses balance with STS on airex today after every ~4 reps and falls backwards on to mat table.   OBJECTIVE IMPAIRMENTS: Abnormal gait, decreased balance, decreased mobility, difficulty walking, decreased ROM, decreased strength, decreased safety awareness, increased fascial restrictions, impaired tone, and impaired UE functional use.   REHAB POTENTIAL: Fair chronicity of stroke  CLINICAL DECISION MAKING: Stable/uncomplicated  EVALUATION COMPLEXITY: Low  PLAN:  PT FREQUENCY: 1-2x/week  PT  DURATION: 10 weeks  PLANNED INTERVENTIONS: Therapeutic exercises, Therapeutic activity, Neuromuscular re-education, Balance training, Gait training, Patient/Family education, Self Care, Joint mobilization, Stair training, Dry Needling, Electrical stimulation, Cryotherapy, Moist heat, Ionotophoresis 20m/ml Dexamethasone, and Manual therapy  PLAN FOR NEXT SESSION: gait training, obstacle course with step ups and airex    MAndris Baumann PT 09/19/2022, 8:45 AM

## 2022-09-22 NOTE — Therapy (Signed)
OUTPATIENT PHYSICAL THERAPY NEURO TREATMENT  Patient Name: Casey Petty MRN: [id number] DOB:May 04, 1961, 62 y.o., male Today's Date: 09/23/2022   PCP: Charlott Rakes REFERRING PROVIDER: Charlott Rakes  END OF SESSION:  PT End of Session - 09/23/22 0756     Visit Number 12    Date for PT Re-Evaluation 10/19/22    Authorization Time Period [facility] Medicare    PT Start Time [phone]    PT Stop Time 0840    PT Time Calculation (min) 44 min    Activity Tolerance Patient tolerated treatment well    Behavior During Therapy [facility] for tasks assessed/performed                Past Medical History:  Diagnosis Date   Diabetes mellitus without complication ([place])    on meds   Hyperlipidemia    on meds   Hypertension    on meds   Stroke ([place]) 2020   R hemiparesis-abnormal gait   Past Surgical History:  Procedure Laterality Date   WISDOM TOOTH EXTRACTION Bilateral 06/2021   Patient Active Problem List   Diagnosis Date Noted   Chronic right shoulder pain 06/22/2020   Adhesive capsulitis of right shoulder 10/07/2019   Abnormality of gait 09/17/2019   Spastic hemiparesis of right dominant side ([place]) 07/02/2019   History of CVA with residual deficit 06/09/2019   Spastic hemiparesis ([place])    Uncontrolled type 2 diabetes mellitus with hyperglycemia ([place])    Noncompliance    Benign essential HTN    Newly diagnosed diabetes ([place])    Leucocytosis    Right hemiparesis ([place])    Basal ganglia stroke ([place]) 03/04/2019   Stroke ([place]) 02/28/2019   Essential hypertension 02/28/2019   Tobacco abuse 02/28/2019   Hyperglycemia 02/28/2019    ONSET DATE: 2020  REFERRING DIAG: G81.10  THERAPY DIAG:  Spastic hemiparesis of right dominant side as late effect of cerebral infarction ([place])  Other abnormalities of gait and mobility  Muscle weakness (generalized)  Unsteadiness on feet  Rationale for Evaluation and Treatment: Rehabilitation  SUBJECTIVE:                                                                                                                                                                                              SUBJECTIVE STATEMENT: Good, no falling or nothing. I am getting botox Thursday  Pt accompanied by: significant other  PERTINENT HISTORY: stroke in 2020  PAIN:  Are you having pain? No  PRECAUTIONS: None  WEIGHT BEARING RESTRICTIONS: No  FALLS: Has patient fallen in last 6 months? No  LIVING ENVIRONMENT: Lives with: lives with their spouse  Lives in: House/apartment Stairs: No Has following equipment at home: Single point cane, Environmental consultant - 4 wheeled, Wheelchair (manual), and Ramped entry  PLOF: Independent and Independent with basic ADLs  PATIENT GOALS: I want to get stronger and walk better  OBJECTIVE:   COGNITION: Overall cognitive status: Within functional limits for tasks assessed   SENSATION: WFL  POSTURE: rounded shoulders, forward head, flexed trunk , and weight shift left  LOWER EXTREMITY ROM:     Active  Right Eval Left Eval  Hip flexion Impaired d/t stroke unable to do actively   Hip extension    Hip abduction    Hip adduction    Hip internal rotation    Hip external rotation    Knee flexion Can do actively but uncontrolled   Knee extension WFL   Ankle dorsiflexion Foot drop, wears an AFO   Ankle plantarflexion    Ankle inversion    Ankle eversion     (Blank rows = not tested)  LOWER EXTREMITY MMT:    MMT Right Eval Left Eval  Hip flexion 2+ 5  Hip extension    Hip abduction    Hip adduction    Hip internal rotation    Hip external rotation    Knee flexion 3+   Knee extension 3+ 5  Ankle dorsiflexion    Ankle plantarflexion    Ankle inversion    Ankle eversion    (Blank rows = not tested)  GAIT: Gait pattern: decreased arm swing- Right, decreased stance time- Right, decreased stride length, decreased hip/knee flexion- Right, decreased ankle dorsiflexion- Right, circumduction- Right,  shuffling, scissoring, ataxic, lateral lean- Left, narrow BOS, and poor foot clearance- Right Distance walked: in clinic distances  Assistive device utilized: Single point cane Level of assistance: Modified independence Comments: decreased safety awareness, some toe catching  FUNCTIONAL TESTS:  5 times sit to stand: 14.06s Timed up and go (TUG): 19.47s,  08/30/22 = 13 s   TODAY'S TREATMENT:                                                                                                                              DATE:  09/23/22 NuStep L5 x21mns Leg ext 5# RLE 2x10 HS curls 15# 2x10 Standing on airex hitting ball in // bars  Marching 3# in // bars Step over 4" on stairs Calf raises STS on airex with overhead reach both arms  09/19/22 NuStep L5 x681ms Obstacles course- stepping over half foam and rolled up mat, step up 4", step up on airex  Volleyball hits standing Volleyball kicks x5 each leg  STS on airex 2x10 LAQ 5# 2x10 HS curls blue 2x10 Leg press 60# 2x10, 20# RLE x10   09/16/22 Progress note NuStep L5 x6m78m Box taps 6" standing on airex- holding SPC, CGA Calf stretch on bar 30s Calf raises 2x10 on bar  HS curls 15# 2x10 RLE Leg ext 5# RLE 2x10 Leg press 60# x10, 20# x10  RLE only   09/07/22 Nustep level 5 LE only x 6 minutes Figure 8's walking on the mat for balance Obstacle course working on step ups and negotiating around [employer] and then alternating kicks In Pbars airex beam side stepping with various hands to no hands Minitramp bounce and march Leg extension 5# right only 2x10 Leg curls 10# right only 2x10 Feet on ball K2C, trunk rotation, bridges, isometric abs 15# left and right arm pulls for core activation and balance 60# leg press both legs  09/05/22 NuStep L5 x15mns  5# right leg only leg extension 2x10 10# right only leg curls 2x10 Obstacle course- 4" step up, walk around cones, step up on airex  Ladder drill one foot in each  box and then side steps STS 2x10 Leg press 60# both x10, right only 20# 2x5  09/01/22 Nustep level 6 x 6 minutes LE only Volleyball standing Volley ball on airex Elliptical 2 minutes CGA 6" toe touches 5# right leg only leg extension 10# right only leg curls Single arm pulls left 15#, right 5# Chest press 5# with mild assist to wrist right arm only Leg press 60# both 2x10, right only 20# Gait with SPC negotiating over 4" and 6" steps in the open  1/232/24 Nustep level 6 x 6 minutes TUG no device 13 seconds Volleyball standing Leg extension 5# cues for TKE Leg curls 10# 2 x10 10# left arm pulls , right arm 5# Leg press 20# x10, then right only with some help to protect knee 3x5 reps Lats 15# 2x6 reps Cone toe touches  Cone hand touches on airex 6" toe clears trying to limit circumduction Hip abduction right Fast walking with use of gait belt, no device 4" step negotiating open floor  08/24/22 Nustep LE only level 6 x 6 minutes Bike level 4 x 6 minutes  3x did the power burst Leg curls 35# 2x10, right only 5# 3x5 Leg extension 5# 2x10 Standing volleyball Direction changes Leg press right only 20# 2x10, then no weight right 2x10 Side stepping over stick In pbars on airex cone hand and toe touches 10# left arm straight arm pull Right only green tband hip ab/adduciton Partial sit ups 2x10  08/22/22 Bike L2 x562ms  NuStep increased resistance and pushing with R side  Leg ext 5# RLE 2x10 HS curls 35# 2x10, RLE 5# x10  Step overs on staircase 4"  Leg press 40# x10, x5 lowering on RLE, x10 20# on RLE 2x5  PATIENT EDUCATION: Education details: HEP and POC Person educated: Patient Education method: Explanation Education comprehension: verbalized understanding  HOME EXERCISE PROGRAM: Access Code: [email or web address]: [email or web address] Date: 08/03/2022 Prepared by: MoFairfield Harbourith Counter Support  - 1 x daily - 7 x weekly - 2  sets - 10 reps - Standing Hip Extension with Unilateral Counter Support  - 1 x daily - 7 x weekly - 2 sets - 10 reps - Standing Hip Abduction with Unilateral Counter Support  - 1 x daily - 7 x weekly - 2 sets - 10 reps - Seated Hip Adduction Isometrics with Ball  - 1 x daily - 7 x weekly - 2 sets - 10 reps  GOALS: Goals reviewed with patient? Yes  SHORT TERM GOALS: Target date: 09/07/22  Patient will be independent with initial HEP. Goal status: met  2. Patient will improved 5xSTS to <12s Baseline: 14.06s, 9.87s-09/16/22 Goal status: MET   LONG TERM GOALS: Target date: 10/19/22  Patient will be independent with advanced/ongoing HEP to improve outcomes and carryover.  Goal status: INITIAL  2.  Patient will demonstrate decreased fall risk by scoring < 15 sec on TUG. Baseline: 19.47s, 13.49s-09/16/22 Goal status: MET  3.  Patient will be able to ambulate 600' with LRAD with good safety to access community.  Baseline: ambulates with SPC and w/o in home Goal status:progressing  4.  Patient will demonstrate improved functional LE strength as demonstrated by 4/5 or better. Goal status: ongoing  5.  Patient will demonstrate at least 20/30 on FGA to improve gait stability and reduce risk for falls. Baseline: 13/30, 16/30- 09/16/22 Goal status: IN PROGRESS  6.  Patient will demonstrate gait speed of >/= 1.8 ft/sec (0.55 m/s) to be a safe limited community ambulator with decreased risk for recurrent falls.  Baseline: 1.25 ft/sec, 1.48 ft/sec Goal status: progressing    ASSESSMENT:  CLINICAL IMPRESSION: Notable shaking with leg extensions 5# on right leg. STS on airex with overhead reach, using LUE to help RUE reach up as high as possible. Still has difficulty with controlling placement of RLE.   OBJECTIVE IMPAIRMENTS: Abnormal gait, decreased balance, decreased mobility, difficulty walking, decreased ROM, decreased strength, decreased safety awareness, increased fascial restrictions,  impaired tone, and impaired UE functional use.   REHAB POTENTIAL: Fair chronicity of stroke  CLINICAL DECISION MAKING: Stable/uncomplicated  EVALUATION COMPLEXITY: Low  PLAN:  PT FREQUENCY: 1-2x/week  PT DURATION: 10 weeks  PLANNED INTERVENTIONS: Therapeutic exercises, Therapeutic activity, Neuromuscular re-education, Balance training, Gait training, Patient/Family education, Self Care, Joint mobilization, Stair training, Dry Needling, Electrical stimulation, Cryotherapy, Moist heat, Ionotophoresis 74m/ml Dexamethasone, and Manual therapy  PLAN FOR NEXT SESSION: gait training, obstacle course with step ups and airex    MAndris Baumann PT 09/23/2022, 8:40 AM

## 2022-09-23 ENCOUNTER — Ambulatory Visit: Payer: Medicare HMO

## 2022-09-23 DIAGNOSIS — M6281 Muscle weakness (generalized): Secondary | ICD-10-CM | POA: Diagnosis not present

## 2022-09-23 DIAGNOSIS — R2681 Unsteadiness on feet: Secondary | ICD-10-CM

## 2022-09-23 DIAGNOSIS — I69351 Hemiplegia and hemiparesis following cerebral infarction affecting right dominant side: Secondary | ICD-10-CM

## 2022-09-23 DIAGNOSIS — M25511 Pain in right shoulder: Secondary | ICD-10-CM | POA: Diagnosis not present

## 2022-09-23 DIAGNOSIS — R2689 Other abnormalities of gait and mobility: Secondary | ICD-10-CM

## 2022-09-23 DIAGNOSIS — G8929 Other chronic pain: Secondary | ICD-10-CM | POA: Diagnosis not present

## 2022-09-27 ENCOUNTER — Encounter: Payer: Self-pay | Admitting: Physical Therapy

## 2022-09-27 ENCOUNTER — Ambulatory Visit: Payer: Medicare HMO | Admitting: Physical Therapy

## 2022-09-27 DIAGNOSIS — M25511 Pain in right shoulder: Secondary | ICD-10-CM | POA: Diagnosis not present

## 2022-09-27 DIAGNOSIS — M6281 Muscle weakness (generalized): Secondary | ICD-10-CM

## 2022-09-27 DIAGNOSIS — R2681 Unsteadiness on feet: Secondary | ICD-10-CM | POA: Diagnosis not present

## 2022-09-27 DIAGNOSIS — R2689 Other abnormalities of gait and mobility: Secondary | ICD-10-CM | POA: Diagnosis not present

## 2022-09-27 DIAGNOSIS — I69351 Hemiplegia and hemiparesis following cerebral infarction affecting right dominant side: Secondary | ICD-10-CM | POA: Diagnosis not present

## 2022-09-27 DIAGNOSIS — G8929 Other chronic pain: Secondary | ICD-10-CM | POA: Diagnosis not present

## 2022-09-27 NOTE — Therapy (Signed)
OUTPATIENT PHYSICAL THERAPY NEURO TREATMENT  Patient Name: Casey Petty MRN: [id number] DOB:Jun 14, 1961, 62 y.o., male Today's Date: 09/27/2022   PCP: Charlott Rakes REFERRING PROVIDER: Charlott Rakes  END OF SESSION:  PT End of Session - 09/27/22 0743     Visit Number 13    Date for PT Re-Evaluation 10/19/22    Authorization Time Period [facility] Medicare    PT Start Time [phone]    PT Stop Time 0834    PT Time Calculation (min) 51 min    Activity Tolerance Patient tolerated treatment well    Behavior During Therapy [facility] for tasks assessed/performed                Past Medical History:  Diagnosis Date   Diabetes mellitus without complication ([place])    on meds   Hyperlipidemia    on meds   Hypertension    on meds   Stroke ([place]) 2020   R hemiparesis-abnormal gait   Past Surgical History:  Procedure Laterality Date   WISDOM TOOTH EXTRACTION Bilateral 06/2021   Patient Active Problem List   Diagnosis Date Noted   Chronic right shoulder pain 06/22/2020   Adhesive capsulitis of right shoulder 10/07/2019   Abnormality of gait 09/17/2019   Spastic hemiparesis of right dominant side ([place]) 07/02/2019   History of CVA with residual deficit 06/09/2019   Spastic hemiparesis ([place])    Uncontrolled type 2 diabetes mellitus with hyperglycemia ([place])    Noncompliance    Benign essential HTN    Newly diagnosed diabetes ([place])    Leucocytosis    Right hemiparesis ([place])    Basal ganglia stroke ([place]) 03/04/2019   Stroke ([place]) 02/28/2019   Essential hypertension 02/28/2019   Tobacco abuse 02/28/2019   Hyperglycemia 02/28/2019    ONSET DATE: 2020  REFERRING DIAG: G81.10  THERAPY DIAG:  Spastic hemiparesis of right dominant side as late effect of cerebral infarction ([place])  Other abnormalities of gait and mobility  Muscle weakness (generalized)  Unsteadiness on feet  Hemiplegia and hemiparesis following cerebral infarction affecting right dominant side ([place])  Rationale  for Evaluation and Treatment: Rehabilitation  SUBJECTIVE:                                                                                                                                                                                             SUBJECTIVE STATEMENT: Patient reports that he was wrong about the botox it will be this Thursday.  Reports no falls.  Reports only wears the AFO when out in the public, does not wear the brace at home  Pt accompanied by: significant other  PERTINENT HISTORY:  stroke in 2020  PAIN:  Are you having pain? No  PRECAUTIONS: None  WEIGHT BEARING RESTRICTIONS: No  FALLS: Has patient fallen in last 6 months? No  LIVING ENVIRONMENT: Lives with: lives with their spouse Lives in: House/apartment Stairs: No Has following equipment at home: Single point cane, Environmental consultant - 4 wheeled, Wheelchair (manual), and Ramped entry  PLOF: Independent and Independent with basic ADLs  PATIENT GOALS: I want to get stronger and walk better  OBJECTIVE:   COGNITION: Overall cognitive status: Within functional limits for tasks assessed   SENSATION: WFL  POSTURE: rounded shoulders, forward head, flexed trunk , and weight shift left  LOWER EXTREMITY ROM:     Active  Right Eval Left Eval  Hip flexion Impaired d/t stroke unable to do actively   Hip extension    Hip abduction    Hip adduction    Hip internal rotation    Hip external rotation    Knee flexion Can do actively but uncontrolled   Knee extension WFL   Ankle dorsiflexion Foot drop, wears an AFO   Ankle plantarflexion    Ankle inversion    Ankle eversion     (Blank rows = not tested)  LOWER EXTREMITY MMT:    MMT Right Eval Left Eval  Hip flexion 2+ 5  Hip extension    Hip abduction    Hip adduction    Hip internal rotation    Hip external rotation    Knee flexion 3+   Knee extension 3+ 5  Ankle dorsiflexion    Ankle plantarflexion    Ankle inversion    Ankle eversion    (Blank rows  = not tested)  GAIT: Gait pattern: decreased arm swing- Right, decreased stance time- Right, decreased stride length, decreased hip/knee flexion- Right, decreased ankle dorsiflexion- Right, circumduction- Right, shuffling, scissoring, ataxic, lateral lean- Left, narrow BOS, and poor foot clearance- Right Distance walked: in clinic distances  Assistive device utilized: Single point cane Level of assistance: Modified independence Comments: decreased safety awareness, some toe catching  FUNCTIONAL TESTS:  5 times sit to stand: 14.06s Timed up and go (TUG): 19.47s,  08/30/22 = 13 s, 09/26/22 = 13 seconds without SPC   TODAY'S TREATMENT:                                                                                                                              DATE:  09/26/22 Nustep LE only Level 6 x 6 minutes Arm pulls and rows single arms at a time with 5# while standing on the airex for balance Leg press 40# 2x10 both legs, 40# right leg x 10, then 30# right leg x 10 10# right arm farmer carry 1 lap rest and then again, had two times required mod A due to catching right foot Marches in Pbars, side stepping in pbars Bridges Supine trunk rotation Partial sit ups Tmill offf pushes with Mod A Red tband HS curls right Sit  to stand on airex with 6# in left hand reach up [place] HS curls  09/23/22 NuStep L5 x76mns Leg ext 5# RLE 2x10 HS curls 15# 2x10 Standing on airex hitting ball in // bars  Marching 3# in // bars Step over 4" on stairs Calf raises STS on airex with overhead reach both arms  09/19/22 NuStep L5 x665ms Obstacles course- stepping over half foam and rolled up mat, step up 4", step up on airex  Volleyball hits standing Volleyball kicks x5 each leg  STS on airex 2x10 LAQ 5# 2x10 HS curls blue 2x10 Leg press 60# 2x10, 20# RLE x10   09/16/22 Progress note NuStep L5 x6m72m Box taps 6" standing on airex- holding SPC, CGA Calf stretch on bar 30s Calf raises 2x10 on bar   HS curls 15# 2x10 RLE Leg ext 5# RLE 2x10 Leg press 60# x10, 20# x10 RLE only   09/07/22 Nustep level 5 LE only x 6 minutes Figure 8's walking on the mat for balance Obstacle course working on step ups and negotiating around obj[employer]-wayd then alternating kicks In Pbars airex beam side stepping with various hands to no hands Minitramp bounce and march Leg extension 5# right only 2x10 Leg curls 10# right only 2x10 Feet on ball K2C, trunk rotation, bridges, isometric abs 15# left and right arm pulls for core activation and balance 60# leg press both legs  09/05/22 NuStep L5 x6mi10m 5# right leg only leg extension 2x10 10# right only leg curls 2x10 Obstacle course- 4" step up, walk around cones, step up on airex  Ladder drill one foot in each box and then side steps STS 2x10 Leg press 60# both x10, right only 20# 2x5  09/01/22 Nustep level 6 x 6 minutes LE only Volleyball standing Volley ball on airex Elliptical 2 minutes CGA 6" toe touches 5# right leg only leg extension 10# right only leg curls Single arm pulls left 15#, right 5# Chest press 5# with mild assist to wrist right arm only Leg press 60# both 2x10, right only 20# Gait with SPC negotiating over 4" and 6" steps in the open  1/232/24 Nustep level 6 x 6 minutes TUG no device 13 seconds Volleyball standing Leg extension 5# cues for TKE Leg curls 10# 2 x10 10# left arm pulls , right arm 5# Leg press 20# x10, then right only with some help to protect knee 3x5 reps Lats 15# 2x6 reps Cone toe touches  Cone hand touches on airex 6" toe clears trying to limit circumduction Hip abduction right Fast walking with use of gait belt, no device 4" step negotiating open floor  PATIENT EDUCATION: Education details: HEP and POC Person educated: Patient Education method: Explanation Education comprehension: verbalized understanding  HOME EXERCISE PROGRAM: Access Code: [email or web address]:  [email or web address] Date: 08/03/2022 Prepared by: MonaJonesh Counter Support  - 1 x daily - 7 x weekly - 2 sets - 10 reps - Standing Hip Extension with Unilateral Counter Support  - 1 x daily - 7 x weekly - 2 sets - 10 reps - Standing Hip Abduction with Unilateral Counter Support  - 1 x daily - 7 x weekly - 2 sets - 10 reps - Seated Hip Adduction Isometrics with Ball  - 1 x daily - 7 x weekly - 2 sets - 10 reps  GOALS: Goals reviewed with patient? Yes  SHORT TERM GOALS: Target date: 09/07/22  Patient will be independent with initial  HEP. Goal status: met  2. Patient will improved 5xSTS to <12s Baseline: 14.06s, 9.87s-09/16/22 Goal status: MET   LONG TERM GOALS: Target date: 10/19/22  Patient will be independent with advanced/ongoing HEP to improve outcomes and carryover.  Goal status: INITIAL  2.  Patient will demonstrate decreased fall risk by scoring < 15 sec on TUG. Baseline: 19.47s, 13.49s-09/16/22 Goal status: MET  3.  Patient will be able to ambulate 600' with LRAD with good safety to access community.  Baseline: ambulates with SPC and w/o in home Goal status:progressing  4.  Patient will demonstrate improved functional LE strength as demonstrated by 4/5 or better. Goal status: ongoing  5.  Patient will demonstrate at least 20/30 on FGA to improve gait stability and reduce risk for falls. Baseline: 13/30, 16/30- 09/16/22 Goal status: IN PROGRESS  6.  Patient will demonstrate gait speed of >/= 1.8 ft/sec (0.55 m/s) to be a safe limited community ambulator with decreased risk for recurrent falls.  Baseline: 1.25 ft/sec, 1.48 ft/sec Goal status: progressing    ASSESSMENT:  CLINICAL IMPRESSION: Tried a few different exercises, some difficulty  with the farmers carry, catching foot after 110 feet and requiring mod A to correct.  There is a synergy with his hip flexion that he goes into knee extension and this does not allow a  good toe clearance, he also has a very weak HS, tried some prone HS curls but this was uncomfortable but this may be a good position to work further in as it looked like he had some hip flexion contractures OBJECTIVE IMPAIRMENTS: Abnormal gait, decreased balance, decreased mobility, difficulty walking, decreased ROM, decreased strength, decreased safety awareness, increased fascial restrictions, impaired tone, and impaired UE functional use.   REHAB POTENTIAL: Fair chronicity of stroke  CLINICAL DECISION MAKING: Stable/uncomplicated  EVALUATION COMPLEXITY: Low  PLAN:  PT FREQUENCY: 1-2x/week  PT DURATION: 10 weeks  PLANNED INTERVENTIONS: Therapeutic exercises, Therapeutic activity, Neuromuscular re-education, Balance training, Gait training, Patient/Family education, Self Care, Joint mobilization, Stair training, Dry Needling, Electrical stimulation, Cryotherapy, Moist heat, Ionotophoresis 11m/ml Dexamethasone, and Manual therapy  PLAN FOR NEXT SESSION: gait training, obstacle course with step ups and airex    ASumner Boast PT 09/27/2022, 7:44 AM

## 2022-09-29 ENCOUNTER — Encounter: Payer: Self-pay | Admitting: Physical Medicine & Rehabilitation

## 2022-09-29 ENCOUNTER — Encounter: Payer: Medicare HMO | Attending: Physical Medicine & Rehabilitation | Admitting: Physical Medicine & Rehabilitation

## 2022-09-29 VITALS — BP 127/70 | HR 83 | Temp 98.4°F | Ht 67.0 in | Wt 150.0 lb

## 2022-09-29 DIAGNOSIS — G8110 Spastic hemiplegia affecting unspecified side: Secondary | ICD-10-CM | POA: Diagnosis not present

## 2022-09-29 MED ORDER — ABOBOTULINUMTOXINA 500 UNITS IM SOLR
1500.0000 [IU] | Freq: Once | INTRAMUSCULAR | Status: AC
  Administered 2022-09-29: 1500 [IU] via INTRAMUSCULAR

## 2022-09-29 NOTE — Patient Instructions (Signed)

## 2022-09-29 NOTE — Therapy (Signed)
OUTPATIENT PHYSICAL THERAPY NEURO TREATMENT  Patient Name: Casey Petty MRN: [id number] DOB:[birth year], 62 y.o., male Today's Date: 09/29/2022   PCP: Charlott Rakes REFERRING PROVIDER: Charlott Rakes  END OF SESSION:       Past Medical History:  Diagnosis Date   Diabetes mellitus without complication ([place])    on meds   Hyperlipidemia    on meds   Hypertension    on meds   Stroke [facility]) 2020   R hemiparesis-abnormal gait   Past Surgical History:  Procedure Laterality Date   WISDOM TOOTH EXTRACTION Bilateral 06/2021   Patient Active Problem List   Diagnosis Date Noted   Chronic right shoulder pain 06/22/2020   Adhesive capsulitis of right shoulder 10/07/2019   Abnormality of gait 09/17/2019   Spastic hemiparesis of right dominant side ([place]) 07/02/2019   History of CVA with residual deficit 06/09/2019   Spastic hemiparesis ([place])    Uncontrolled type 2 diabetes mellitus with hyperglycemia ([place])    Noncompliance    Benign essential HTN    Newly diagnosed diabetes ([place])    Leucocytosis    Right hemiparesis ([place])    Basal ganglia stroke ([place]) 03/04/2019   Stroke ([place]) 02/28/2019   Essential hypertension 02/28/2019   Tobacco abuse 02/28/2019   Hyperglycemia 02/28/2019    ONSET DATE: 2020  REFERRING DIAG: G81.10  THERAPY DIAG:  No diagnosis found.  Rationale for Evaluation and Treatment: Rehabilitation  SUBJECTIVE:                                                                                                                                                                                             SUBJECTIVE STATEMENT: I got botox shots yesterday, 10 in my arm and 5 in my leg.   Pt accompanied by: significant other  PERTINENT HISTORY: stroke in 2020  PAIN:  Are you having pain? No  PRECAUTIONS: None  WEIGHT BEARING RESTRICTIONS: No  FALLS: Has patient fallen in last 6 months? No  LIVING ENVIRONMENT: Lives with: lives with their spouse Lives  in: House/apartment Stairs: No Has following equipment at home: Single point cane, Environmental consultant - 4 wheeled, Wheelchair (manual), and Ramped entry  PLOF: Independent and Independent with basic ADLs  PATIENT GOALS: I want to get stronger and walk better  OBJECTIVE:   COGNITION: Overall cognitive status: Within functional limits for tasks assessed   SENSATION: WFL  POSTURE: rounded shoulders, forward head, flexed trunk , and weight shift left  LOWER EXTREMITY ROM:     Active  Right Eval Left Eval  Hip flexion Impaired d/t stroke unable to do actively   Hip extension  Hip abduction    Hip adduction    Hip internal rotation    Hip external rotation    Knee flexion Can do actively but uncontrolled   Knee extension WFL   Ankle dorsiflexion Foot drop, wears an AFO   Ankle plantarflexion    Ankle inversion    Ankle eversion     (Blank rows = not tested)  LOWER EXTREMITY MMT:    MMT Right Eval Left Eval  Hip flexion 2+ 5  Hip extension    Hip abduction    Hip adduction    Hip internal rotation    Hip external rotation    Knee flexion 3+   Knee extension 3+ 5  Ankle dorsiflexion    Ankle plantarflexion    Ankle inversion    Ankle eversion    (Blank rows = not tested)  GAIT: Gait pattern: decreased arm swing- Right, decreased stance time- Right, decreased stride length, decreased hip/knee flexion- Right, decreased ankle dorsiflexion- Right, circumduction- Right, shuffling, scissoring, ataxic, lateral lean- Left, narrow BOS, and poor foot clearance- Right Distance walked: in clinic distances  Assistive device utilized: Single point cane Level of assistance: Modified independence Comments: decreased safety awareness, some toe catching  FUNCTIONAL TESTS:  5 times sit to stand: 14.06s Timed up and go (TUG): 19.47s,  08/30/22 = 13 s, 09/26/22 = 13 seconds without SPC   TODAY'S TREATMENT:                                                                                                                               DATE:  09/30/22 NuStep L5 x56mns Prone leg ext RLE 2x10 Sidelying abd 2x10- poor control  SLR 2x10  Bridges 2x10 Passive stretching of hamstrings 30s  STS 2x10 Step overs 6"  Side steps on foam  Leg press 60# 2x10, RLE 20# x10    09/26/22 Nustep LE only Level 6 x 6 minutes Arm pulls and rows single arms at a time with 5# while standing on the airex for balance Leg press 40# 2x10 both legs, 40# right leg x 10, then 30# right leg x 10 10# right arm farmer carry 1 lap rest and then again, had two times required mod A due to catching right foot Marches in Pbars, side stepping in pbars Bridges Supine trunk rotation Partial sit ups Tmill offf pushes with Mod A Red tband HS curls right Sit to stand on airex with 6# in left hand reach up Prone HS curls  09/23/22 NuStep L5 x663ms Leg ext 5# RLE 2x10 HS curls 15# 2x10 Standing on airex hitting ball in // bars  Marching 3# in // bars Step over 4" on stairs Calf raises STS on airex with overhead reach both arms  09/19/22 NuStep L5 x6m35m Obstacles course- stepping over half foam and rolled up mat, step up 4", step up on airex  Volleyball hits standing Volleyball kicks x5 each leg  STS on airex 2x10 LAQ  5# 2x10 HS curls blue 2x10 Leg press 60# 2x10, 20# RLE x10   09/16/22 Progress note NuStep L5 x68mns Box taps 6" standing on airex- holding SPC, CGA Calf stretch on bar 30s Calf raises 2x10 on bar  HS curls 15# 2x10 RLE Leg ext 5# RLE 2x10 Leg press 60# x10, 20# x10 RLE only   09/07/22 Nustep level 5 LE only x 6 minutes Figure 8's walking on the mat for balance Obstacle course working on step ups and negotiating around oCon-wayand then alternating kicks In Pbars airex beam side stepping with various hands to no hands Minitramp bounce and march Leg extension 5# right only 2x10 Leg curls 10# right only 2x10 Feet on ball K2C, trunk rotation, bridges, isometric  abs 15# left and right arm pulls for core activation and balance 60# leg press both legs  09/05/22 NuStep L5 x627ms  5# right leg only leg extension 2x10 10# right only leg curls 2x10 Obstacle course- 4" step up, walk around cones, step up on airex  Ladder drill one foot in each box and then side steps STS 2x10 Leg press 60# both x10, right only 20# 2x5  09/01/22 Nustep level 6 x 6 minutes LE only Volleyball standing Volley ball on airex Elliptical 2 minutes CGA 6" toe touches 5# right leg only leg extension 10# right only leg curls Single arm pulls left 15#, right 5# Chest press 5# with mild assist to wrist right arm only Leg press 60# both 2x10, right only 20# Gait with SPC negotiating over 4" and 6" steps in the open  1/232/24 Nustep level 6 x 6 minutes TUG no device 13 seconds Volleyball standing Leg extension 5# cues for TKE Leg curls 10# 2 x10 10# left arm pulls , right arm 5# Leg press 20# x10, then right only with some help to protect knee 3x5 reps Lats 15# 2x6 reps Cone toe touches  Cone hand touches on airex 6" toe clears trying to limit circumduction Hip abduction right Fast walking with use of gait belt, no device 4" step negotiating open floor  PATIENT EDUCATION: Education details: HEP and POC Person educated: Patient Education method: Explanation Education comprehension: verbalized understanding  HOME EXERCISE PROGRAM: Access Code: [email or web address]: [email or web address] Date: 08/03/2022 Prepared by: [place] Counter Support  - 1 x daily - 7 x weekly - 2 sets - 10 reps - Standing Hip Extension with Unilateral Counter Support  - 1 x daily - 7 x weekly - 2 sets - 10 reps - Standing Hip Abduction with Unilateral Counter Support  - 1 x daily - 7 x weekly - 2 sets - 10 reps - Seated Hip Adduction Isometrics with Ball  - 1 x daily - 7 x weekly - 2 sets - 10 reps  GOALS: Goals reviewed with patient?  Yes  SHORT TERM GOALS: Target date: 09/07/22  Patient will be independent with initial HEP. Goal status: met  2. Patient will improved 5xSTS to <12s Baseline: 14.06s, 9.87s-09/16/22 Goal status: MET   LONG TERM GOALS: Target date: 10/19/22  Patient will be independent with advanced/ongoing HEP to improve outcomes and carryover.  Goal status: INITIAL  2.  Patient will demonstrate decreased fall risk by scoring < 15 sec on TUG. Baseline: 19.47s, 13.49s-09/16/22 Goal status: MET  3.  Patient will be able to ambulate 600' with LRAD with good safety to access community.  Baseline: ambulates with SPC and w/o in home Goal status:progressing  4.  Patient will demonstrate improved functional LE strength as demonstrated by 4/5 or better. Goal status: ongoing  5.  Patient will demonstrate at least 20/30 on FGA to improve gait stability and reduce risk for falls. Baseline: 13/30, 16/30- 09/16/22 Goal status: IN PROGRESS  6.  Patient will demonstrate gait speed of >/= 1.8 ft/sec (0.55 m/s) to be a safe limited community ambulator with decreased risk for recurrent falls.  Baseline: 1.25 ft/sec, 1.48 ft/sec Goal status: progressing    ASSESSMENT:  CLINICAL IMPRESSION: Patient received botox shots yesterday, states it will take about a week to kick in. His arm is moving better and fist is not so balled up. Difficulty noted with prone hip extensions and sidelying abduction, has poor control of RLE.  OBJECTIVE IMPAIRMENTS: Abnormal gait, decreased balance, decreased mobility, difficulty walking, decreased ROM, decreased strength, decreased safety awareness, increased fascial restrictions, impaired tone, and impaired UE functional use.   REHAB POTENTIAL: Fair chronicity of stroke  CLINICAL DECISION MAKING: Stable/uncomplicated  EVALUATION COMPLEXITY: Low  PLAN:  PT FREQUENCY: 1-2x/week  PT DURATION: 10 weeks  PLANNED INTERVENTIONS: Therapeutic exercises, Therapeutic activity,  Neuromuscular re-education, Balance training, Gait training, Patient/Family education, Self Care, Joint mobilization, Stair training, Dry Needling, Electrical stimulation, Cryotherapy, Moist heat, Ionotophoresis '4mg'$ /ml Dexamethasone, and Manual therapy  PLAN FOR NEXT SESSION: gait training, obstacle course with step ups and airex    Andris Baumann, PT 09/29/2022, 6:42 PM

## 2022-09-29 NOTE — Progress Notes (Signed)
Dysport Injection for spasticity using needle EMG guidance  Pt has been responding well to botulinum toxin injections for stroke related RIght spastic hemiparesis  Dilution: 200 Units/ml Indication: Severe spasticity which interferes with ADL,mobility and/or  hygiene and is unresponsive to medication management and other conservative care Informed consent was obtained after describing risks and benefits of the procedure with the patient. This includes bleeding, bruising, infection, excessive weakness, or medication side effects. A REMS form is on file and signed. Needle:  needle electrode Number of units per muscle RIGHT       Biceps:            100                                     Brachioradialis 200                                     FCR                 200                                     PT                   200                                     FDS                 200                                     FDP                 100                                       Right    Soleus 100  Hamstrings 400U All injections were done after obtaining appropriate EMG activity and after negative drawback for blood. The patient tolerated the procedure well. Post procedure instructions were given. A followup appointment was made 6 wk.

## 2022-09-30 ENCOUNTER — Other Ambulatory Visit: Payer: Self-pay

## 2022-09-30 ENCOUNTER — Ambulatory Visit: Payer: Medicare HMO

## 2022-09-30 ENCOUNTER — Other Ambulatory Visit: Payer: Self-pay | Admitting: Family Medicine

## 2022-09-30 DIAGNOSIS — M25511 Pain in right shoulder: Secondary | ICD-10-CM | POA: Diagnosis not present

## 2022-09-30 DIAGNOSIS — I69351 Hemiplegia and hemiparesis following cerebral infarction affecting right dominant side: Secondary | ICD-10-CM | POA: Diagnosis not present

## 2022-09-30 DIAGNOSIS — R2681 Unsteadiness on feet: Secondary | ICD-10-CM

## 2022-09-30 DIAGNOSIS — G8929 Other chronic pain: Secondary | ICD-10-CM | POA: Diagnosis not present

## 2022-09-30 DIAGNOSIS — R2689 Other abnormalities of gait and mobility: Secondary | ICD-10-CM | POA: Diagnosis not present

## 2022-09-30 DIAGNOSIS — I152 Hypertension secondary to endocrine disorders: Secondary | ICD-10-CM

## 2022-09-30 DIAGNOSIS — M6281 Muscle weakness (generalized): Secondary | ICD-10-CM | POA: Diagnosis not present

## 2022-09-30 DIAGNOSIS — I6930 Unspecified sequelae of cerebral infarction: Secondary | ICD-10-CM

## 2022-09-30 DIAGNOSIS — Z720 Tobacco use: Secondary | ICD-10-CM

## 2022-09-30 DIAGNOSIS — Z794 Long term (current) use of insulin: Secondary | ICD-10-CM

## 2022-09-30 MED ORDER — ROSUVASTATIN CALCIUM 40 MG PO TABS: 40.0000 mg | ORAL_TABLET | Freq: Every day | ORAL | 2 refills | Status: DC

## 2022-09-30 MED ORDER — BUPROPION HCL ER (XL) 150 MG PO TB24: 150.0000 mg | ORAL_TABLET | Freq: Every day | ORAL | 1 refills | Status: DC

## 2022-09-30 MED ORDER — INSULIN GLARGINE 100 UNIT/ML SOLOSTAR PEN: 10.0000 [IU] | PEN_INJECTOR | Freq: Every day | SUBCUTANEOUS | 0 refills | Status: DC

## 2022-09-30 MED ORDER — AMLODIPINE BESYLATE 10 MG PO TABS: ORAL_TABLET | ORAL | 1 refills | Status: DC

## 2022-09-30 MED ORDER — CARVEDILOL 12.5 MG PO TABS: ORAL_TABLET | ORAL | 1 refills | Status: DC

## 2022-09-30 MED ORDER — EZETIMIBE 10 MG PO TABS: 10.0000 mg | ORAL_TABLET | Freq: Every evening | ORAL | 2 refills | Status: DC

## 2022-09-30 MED ORDER — EMPAGLIFLOZIN 25 MG PO TABS: ORAL_TABLET | ORAL | 1 refills | Status: DC

## 2022-09-30 MED ORDER — HYDRALAZINE HCL 100 MG PO TABS: 100.0000 mg | ORAL_TABLET | Freq: Three times a day (TID) | ORAL | 1 refills | Status: DC

## 2022-09-30 NOTE — Telephone Encounter (Signed)
Requested medication (s) are due for refill today: Yed  Requested medication (s) are on the active medication list: Yes  Last refill:  07/21/22  Future visit scheduled:Yes  Notes to clinic:  Unable to refill per protocol due to failed labs, no updated results.      Requested Prescriptions  Pending Prescriptions Disp Refills   hydrALAZINE (APRESOLINE) 100 MG tablet 270 tablet 1    Sig: Take 1 tablet (100 mg total) by mouth 3 (three) times daily.     Cardiovascular:  Vasodilators Failed - 09/30/2022  3:56 PM      Failed - HCT in normal range and within 360 days    HCT  Date Value Ref Range Status  03/18/2019 46.2 39.0 - 52.0 % Final         Failed - HGB in normal range and within 360 days    Hemoglobin  Date Value Ref Range Status  03/18/2019 15.2 13.0 - 17.0 g/dL Final         Failed - RBC in normal range and within 360 days    RBC  Date Value Ref Range Status  03/18/2019 5.00 4.22 - 5.81 MIL/uL Final         Failed - WBC in normal range and within 360 days    WBC  Date Value Ref Range Status  03/18/2019 10.2 4.0 - 10.5 K/uL Final         Failed - PLT in normal range and within 360 days    Platelets  Date Value Ref Range Status  03/18/2019 278 150 - 400 K/uL Final         Failed - ANA Screen, Ifa, Serum in normal range and within 360 days    No results found for: "ANA", "ANATITER", "LABANTI"       Passed - Last BP in normal range    BP Readings from Last 1 Encounters:  09/29/22 127/70         Passed - Valid encounter within last 12 months    Recent Outpatient Visits           2 months ago Type 2 diabetes mellitus with other specified complication, with long-term current use of insulin ([place])   [place], Parkline, MD   8 months ago Type 2 diabetes mellitus with other specified complication, with long-term current use of insulin ([place])   [place], [place], MD   1 year  ago Type 2 diabetes mellitus with other specified complication, with long-term current use of insulin ([place])   [place], [place], MD   1 year ago Type 2 diabetes mellitus with other specified complication, with long-term current use of insulin ([place])   [place] Charlott Rakes, MD   1 year ago Spastic hemiparesis [facility])   [place], Charlane Ferretti, MD       Future Appointments             In 3 months Charlott Rakes, MD [place]            Signed Prescriptions Disp Refills   rosuvastatin (CRESTOR) 40 MG tablet 90 tablet 2    Sig: Take 1 tablet (40 mg total) by mouth daily.     Cardiovascular:  Antilipid - Statins 2 Failed - 09/30/2022  3:56 PM  Failed - Lipid Panel in normal range within the last 12 months    Cholesterol, Total  Date Value Ref Range Status  07/22/2022 119 100 - 199 mg/dL Final   LDL Chol Calc (NIH)  Date Value Ref Range Status  07/22/2022 57 0 - 99 mg/dL Final   HDL  Date Value Ref Range Status  07/22/2022 43 >39 mg/dL Final   Triglycerides  Date Value Ref Range Status  07/22/2022 101 0 - 149 mg/dL Final         Passed - Cr in normal range and within 360 days    Creat  Date Value Ref Range Status  07/18/2014 1.14 0.50 - 1.35 mg/dL Final   Creatinine, Ser  Date Value Ref Range Status  08/26/2022 0.95 0.76 - 1.27 mg/dL Final         Passed - Patient is not pregnant      Passed - Valid encounter within last 12 months    Recent Outpatient Visits           2 months ago Type 2 diabetes mellitus with other specified complication, with long-term current use of insulin ([place])   [place], Dunbar, MD   8 months ago Type 2 diabetes mellitus with other specified complication, with long-term current use of insulin ([place])   [place], H[place], MD   1 year ago Type 2 diabetes mellitus with other specified complication, with long-term current use of insulin ([place])   [place], [place], MD   1 year ago Type 2 diabetes mellitus with other specified complication, with long-term current use of insulin ([place])   [place] Charlott Rakes, MD   1 year ago Spastic hemiparesis [facility])   [place], Charlane Ferretti, MD       Future Appointments             In 3 months Margarita Rana, Charlane Ferretti, MD [place]             amLODipine (NORVASC) 10 MG tablet 90 tablet 1    Sig: TAKE ONE TABLET BY MOUTH EVERY MORNING FOR BLOOD PRESSURE     Cardiovascular: Calcium Channel Blockers 2 Passed - 09/30/2022  3:56 PM      Passed - Last BP in normal range    BP Readings from Last 1 Encounters:  09/29/22 127/70         Passed - Last Heart Rate in normal range    Pulse Readings from Last 1 Encounters:  09/29/22 83         Passed - Valid encounter within last 6 months    Recent Outpatient Visits           2 months ago Type 2 diabetes mellitus with other specified complication, with long-term current use of insulin ([place])   [place], [place]low, MD   8 months ago Type 2 diabetes mellitus with other specified complication, with long-term current use of insulin ([place])   [place], Con, MD   1 year ago Type 2 diabetes mellitus with other specified complication, with long-term current use of insulin [facility])   [place], Charlane Ferretti, MD   1 year ago Type 2 diabetes mellitus with other specified complication,  with long-term current use of insulin ([place])   [place] Charlott Rakes, MD   1 year ago Spastic hemiparesis  [facility])   [place], Charlane Ferretti, MD       Future Appointments             In 3 months Charlott Rakes, MD [place]             buPROPion (WELLBUTRIN XL) 150 MG 24 hr tablet 90 tablet 1    Sig: Take 1 tablet (150 mg total) by mouth daily.     Psychiatry: Antidepressants - bupropion Passed - 09/30/2022  3:56 PM      Passed - Cr in normal range and within 360 days    Creat  Date Value Ref Range Status  07/18/2014 1.14 0.50 - 1.35 mg/dL Final   Creatinine, Ser  Date Value Ref Range Status  08/26/2022 0.95 0.76 - 1.27 mg/dL Final         Passed - AST in normal range and within 360 days    AST  Date Value Ref Range Status  08/26/2022 35 0 - 40 IU/L Final         Passed - ALT in normal range and within 360 days    ALT  Date Value Ref Range Status  08/26/2022 38 0 - 44 IU/L Final         Passed - Last BP in normal range    BP Readings from Last 1 Encounters:  09/29/22 127/70         Passed - Valid encounter within last 6 months    Recent Outpatient Visits           2 months ago Type 2 diabetes mellitus with other specified complication, with long-term current use of insulin ([place])   [place], Charlane Ferretti, MD   8 months ago Type 2 diabetes mellitus with other specified complication, with long-term current use of insulin ([place])   [place], Canby, MD   1 year ago Type 2 diabetes mellitus with other specified complication, with long-term current use of insulin ([place])   [place], Powers, MD   1 year ago Type 2 diabetes mellitus with other specified complication, with long-term current use of insulin ([place])   [place] Charlott Rakes, MD   1 year ago Spastic hemiparesis [facility])   [place],  Enobong, MD       Future Appointments             In 3 months Charlott Rakes, MD [place]             carvedilol (COREG) 12.5 MG tablet 180 tablet 1    Sig: TAKE ONE TABLET BY MOUTH EVERY MORNING and TAKE ONE TABLET BY MOUTH EVERY EVENING     Cardiovascular: Beta Blockers 3 Passed - 09/30/2022  3:56 PM      Passed - Cr in normal range and within 360 days    Creat  Date Value Ref Range Status  07/18/2014 1.14 0.50 - 1.35 mg/dL Final   Creatinine, Ser  Date Value Ref Range Status  08/26/2022 0.95 0.76 - 1.27 mg/dL Final         Passed - AST  in normal range and within 360 days    AST  Date Value Ref Range Status  08/26/2022 35 0 - 40 IU/L Final         Passed - ALT in normal range and within 360 days    ALT  Date Value Ref Range Status  08/26/2022 38 0 - 44 IU/L Final         Passed - Last BP in normal range    BP Readings from Last 1 Encounters:  09/29/22 127/70         Passed - Last Heart Rate in normal range    Pulse Readings from Last 1 Encounters:  09/29/22 83         Passed - Valid encounter within last 6 months    Recent Outpatient Visits           2 months ago Type 2 diabetes mellitus with other specified complication, with long-term current use of insulin ([place])   [place], [place], MD   8 months ago Type 2 diabetes mellitus with other specified complication, with long-term current use of insulin ([place])   [place], Charlane Ferretti, MD   1 year ago Type 2 diabetes mellitus with other specified complication, with long-term current use of insulin ([place])   [place], Charlane Ferretti, MD   1 year ago Type 2 diabetes mellitus with other specified complication, with long-term current use of insulin ([place])   [place], Charlane Ferretti, MD   1 year ago Spastic hemiparesis  [facility])   [place], Charlane Ferretti, MD       Future Appointments             In 3 months Charlott Rakes, MD [place]             empagliflozin (JARDIANCE) 25 MG TABS tablet 90 tablet 1    Sig: TAKE ONE TABLET BY MOUTH BEFORE BREAKFAST     Endocrinology:  Diabetes - SGLT2 Inhibitors Passed - 09/30/2022  3:56 PM      Passed - Cr in normal range and within 360 days    Creat  Date Value Ref Range Status  07/18/2014 1.14 0.50 - 1.35 mg/dL Final   Creatinine, Ser  Date Value Ref Range Status  08/26/2022 0.95 0.76 - 1.27 mg/dL Final         Passed - HBA1C is between 0 and 7.9 and within 180 days    HbA1c, POC (controlled diabetic range)  Date Value Ref Range Status  07/21/2022 6.7 0.0 - 7.0 % Final         Passed - eGFR in normal range and within 360 days    GFR, Est African American  Date Value Ref Range Status  07/18/2014 86 mL/min Final   GFR calc Af Amer  Date Value Ref Range Status  09/01/2020 92 >59 mL/min/1.73 Final    Comment:    **In accordance with recommendations from the NKF-ASN Task force,**   [facility] is in the process of updating its eGFR calculation to the   2021 CKD-EPI creatinine equation that estimates kidney function   without a race variable.    GFR, Est Non African American  Date Value Ref Range Status  07/18/2014 74 mL/min Final    Comment:      The estimated GFR  is a calculation valid for adults (>=49 years old) that uses the CKD-EPI algorithm to adjust for age and sex. It is   not to be used for children, pregnant women, hospitalized patients,    patients on dialysis, or with rapidly changing kidney function. According to the NKDEP, eGFR >89 is normal, 60-89 shows mild impairment, 30-59 shows moderate impairment, 15-29 shows severe impairment and <15 is ESRD.      GFR calc non Af Amer  Date Value Ref Range Status  09/01/2020 79 >59 mL/min/1.73 Final   eGFR   Date Value Ref Range Status  08/26/2022 92 >59 mL/min/1.73 Final         Passed - Valid encounter within last 6 months    Recent Outpatient Visits           2 months ago Type 2 diabetes mellitus with other specified complication, with long-term current use of insulin ([place])   [place], [place], MD   8 months ago Type 2 diabetes mellitus with other specified complication, with long-term current use of insulin ([place])   [place], [place]enton, MD   1 year ago Type 2 diabetes mellitus with other specified complication, with long-term current use of insulin ([place])   [place], [place], MD   1 year ago Type 2 diabetes mellitus with other specified complication, with long-term current use of insulin ([place])   [place] Charlott Rakes, MD   1 year ago Spastic hemiparesis [facility])   [place], Charlane Ferretti, MD       Future Appointments             In 3 months Charlott Rakes, MD [place]             ezetimibe (ZETIA) 10 MG tablet 90 tablet 2    Sig: Take 1 tablet (10 mg total) by mouth every evening.     Cardiovascular:  Antilipid - Sterol Transport Inhibitors Failed - 09/30/2022  3:56 PM      Failed - Lipid Panel in normal range within the last 12 months    Cholesterol, Total  Date Value Ref Range Status  07/22/2022 119 100 - 199 mg/dL Final   LDL Chol Calc (NIH)  Date Value Ref Range Status  07/22/2022 57 0 - 99 mg/dL Final   HDL  Date Value Ref Range Status  07/22/2022 43 >39 mg/dL Final   Triglycerides  Date Value Ref Range Status  07/22/2022 101 0 - 149 mg/dL Final         Passed - AST in normal range and within 360 days    AST  Date Value Ref Range Status  08/26/2022 35 0 - 40 IU/L Final         Passed - ALT in normal range  and within 360 days    ALT  Date Value Ref Range Status  08/26/2022 38 0 - 44 IU/L Final         Passed - Patient is not pregnant      Passed - Valid encounter within last 12 months    Recent Outpatient Visits           2 months ago Type 2 diabetes mellitus with other specified complication, with long-term current use of insulin ([place])   [place] Charlott Rakes, MD   8 months ago Type 2 diabetes mellitus with other specified complication, with long-term current use of insulin ([place])   [place], [place], MD   1 year ago Type 2 diabetes mellitus with other specified complication, with long-term current use of insulin ([place])   [place], Charlane Ferretti, MD   1 year ago Type 2 diabetes mellitus with other specified complication, with long-term current use of insulin ([place])   [place] Charlott Rakes, MD   1 year ago Spastic hemiparesis [facility])   [place] Charlott Rakes, MD       Future Appointments             In 3 months Charlott Rakes, MD [place]             insulin glargine (LANTUS) 100 UNIT/ML Solostar Pen 15 mL 0    Sig: INJECT 10 UNITS INTO THE SKIN DAILY.     Endocrinology:  Diabetes - Insulins Passed - 09/30/2022  3:56 PM      Passed - HBA1C is between 0 and 7.9 and within 180 days    HbA1c, POC (controlled diabetic range)  Date Value Ref Range Status  07/21/2022 6.7 0.0 - 7.0 % Final         Passed - Valid encounter within last 6 months    Recent Outpatient Visits           2 months ago Type 2 diabetes mellitus with other specified complication, with long-term current use of insulin ([place])   [place], Staley, MD   8 months ago Type 2 diabetes mellitus with other specified complication,  with long-term current use of insulin ([place])   [place], [place], MD   1 year ago Type 2 diabetes mellitus with other specified complication, with long-term current use of insulin ([place])   [place], Charlane Ferretti, MD   1 year ago Type 2 diabetes mellitus with other specified complication, with long-term current use of insulin [facility])   [place] Charlott Rakes, MD   1 year ago Spastic hemiparesis [facility])   [place] Charlott Rakes, MD       Future Appointments             In 3 months Charlott Rakes, MD [place]

## 2022-09-30 NOTE — Telephone Encounter (Signed)
Pharmacy stated they faxed over several request and have not received a response. Requesting a 90 day supplyp  rosuvastatin (CRESTOR) 40 MG tablet  amLODipine (NORVASC) 10 MG tablet  buPROPion (WELLBUTRIN XL) 150 MG 24 hr table  carvedilol (COREG) 12.5 MG tablet  Jardence 25 mg hydrALAZINE (APRESOLINE) 100 MG tablet  ezetimibe (ZETIA) 10 MG tablet  insulin glargine (LANTUS) 100 UNIT/ML Solostar Pen   [facility] - [place], [place] - [address] [[id number]]

## 2022-09-30 NOTE — Telephone Encounter (Signed)
Requested Prescriptions  Pending Prescriptions Disp Refills   rosuvastatin (CRESTOR) 40 MG tablet 90 tablet 2    Sig: Take 1 tablet (40 mg total) by mouth daily.     Cardiovascular:  Antilipid - Statins 2 Failed - 09/30/2022  3:56 PM      Failed - Lipid Panel in normal range within the last 12 months    Cholesterol, Total  Date Value Ref Range Status  07/22/2022 119 100 - 199 mg/dL Final   LDL Chol Calc (NIH)  Date Value Ref Range Status  07/22/2022 57 0 - 99 mg/dL Final   HDL  Date Value Ref Range Status  07/22/2022 43 >39 mg/dL Final   Triglycerides  Date Value Ref Range Status  07/22/2022 101 0 - 149 mg/dL Final         Passed - Cr in normal range and within 360 days    Creat  Date Value Ref Range Status  07/18/2014 1.14 0.50 - 1.35 mg/dL Final   Creatinine, Ser  Date Value Ref Range Status  08/26/2022 0.95 0.76 - 1.27 mg/dL Final         Passed - Patient is not pregnant      Passed - Valid encounter within last 12 months    Recent Outpatient Visits           2 months ago Type 2 diabetes mellitus with other specified complication, with long-term current use of insulin ([place])   Arthur, Veblen, MD   8 months ago Type 2 diabetes mellitus with other specified complication, with long-term current use of insulin ([place])   [place], [place], MD   1 year ago Type 2 diabetes mellitus with other specified complication, with long-term current use of insulin ([place])   [place], [place], MD   1 year ago Type 2 diabetes mellitus with other specified complication, with long-term current use of insulin ([place])   [place] Charlott Rakes, MD   1 year ago Spastic hemiparesis [facility])   [place], Charlane Ferretti, MD       Future Appointments             In 3 months Margarita Rana,  Charlane Ferretti, MD [place]             amLODipine (NORVASC) 10 MG tablet 90 tablet 1    Sig: TAKE ONE TABLET BY MOUTH EVERY MORNING FOR BLOOD PRESSURE     Cardiovascular: Calcium Channel Blockers 2 Passed - 09/30/2022  3:56 PM      Passed - Last BP in normal range    BP Readings from Last 1 Encounters:  09/29/22 127/70         Passed - Last Heart Rate in normal range    Pulse Readings from Last 1 Encounters:  09/29/22 83         Passed - Valid encounter within last 6 months    Recent Outpatient Visits           2 months ago Type 2 diabetes mellitus with other specified complication, with long-term current use of insulin ([place])   [place], [place], MD   8 months ago Type 2 diabetes mellitus with other specified complication, with long-term current use of insulin [facility])   [facility] [place], [place], MD   1 year ago Type 2 diabetes mellitus with other specified complication, with long-term current use of insulin ([place])   [place], [place], MD   1 year ago Type 2 diabetes mellitus with other specified complication, with long-term current use of insulin [facility])   [place] Charlott Rakes, MD   1 year ago Spastic hemiparesis [facility])   [place], Charlane Ferretti, MD       Future Appointments             In 3 months Charlott Rakes, MD [place]             buPROPion (WELLBUTRIN XL) 150 MG 24 hr tablet 90 tablet 1    Sig: Take 1 tablet (150 mg total) by mouth daily.     Psychiatry: Antidepressants - bupropion Passed - 09/30/2022  3:56 PM      Passed - Cr in normal range and within 360 days    Creat  Date Value Ref Range Status  07/18/2014 1.14 0.50 - 1.35 mg/dL Final   Creatinine, Ser  Date Value Ref Range  Status  08/26/2022 0.95 0.76 - 1.27 mg/dL Final         Passed - AST in normal range and within 360 days    AST  Date Value Ref Range Status  08/26/2022 35 0 - 40 IU/L Final         Passed - ALT in normal range and within 360 days    ALT  Date Value Ref Range Status  08/26/2022 38 0 - 44 IU/L Final         Passed - Last BP in normal range    BP Readings from Last 1 Encounters:  09/29/22 127/70         Passed - Valid encounter within last 6 months    Recent Outpatient Visits           2 months ago Type 2 diabetes mellitus with other specified complication, with long-term current use of insulin ([place])   [place], [place], MD   8 months ago Type 2 diabetes mellitus with other specified complication, with long-term current use of insulin ([place])   [place] Lupus, [place], MD   1 year ago Type 2 diabetes mellitus with other specified complication, with long-term current use of insulin ([place])   [place] Ranson, Moss[place], MD   1 year ago Type 2 diabetes mellitus with other specified complication, with long-term current use of insulin ([place])   [place] Charlott Rakes, MD   1 year ago Spastic hemiparesis [facility])   [place], Enobong, MD       Future Appointments             In 3 months Charlott Rakes, MD [place]             carvedilol (COREG) 12.5 MG tablet 180 tablet 1    Sig: TAKE ONE TABLET BY MOUTH EVERY MORNING and TAKE ONE TABLET BY MOUTH EVERY EVENING     Cardiovascular: Beta Blockers 3 Passed - 09/30/2022  3:56 PM      Passed - Cr in normal range and within  360 days    Creat  Date Value Ref Range Status  07/18/2014 1.14 0.50 - 1.35 mg/dL Final   Creatinine, Ser  Date Value Ref Range Status  08/26/2022 0.95 0.76 - 1.27 mg/dL  Final         Passed - AST in normal range and within 360 days    AST  Date Value Ref Range Status  08/26/2022 35 0 - 40 IU/L Final         Passed - ALT in normal range and within 360 days    ALT  Date Value Ref Range Status  08/26/2022 38 0 - 44 IU/L Final         Passed - Last BP in normal range    BP Readings from Last 1 Encounters:  09/29/22 127/70         Passed - Last Heart Rate in normal range    Pulse Readings from Last 1 Encounters:  09/29/22 83         Passed - Valid encounter within last 6 months    Recent Outpatient Visits           2 months ago Type 2 diabetes mellitus with other specified complication, with long-term current use of insulin ([place])   [place], Charlane Ferretti, MD   8 months ago Type 2 diabetes mellitus with other specified complication, with long-term current use of insulin ([place])   [place], Charlane Ferretti, MD   1 year ago Type 2 diabetes mellitus with other specified complication, with long-term current use of insulin ([place])   [place], Charlane Ferretti, MD   1 year ago Type 2 diabetes mellitus with other specified complication, with long-term current use of insulin ([place])   [place], Charlane Ferretti, MD   1 year ago Spastic hemiparesis [facility])   [place], Charlane Ferretti, MD       Future Appointments             In 3 months Charlott Rakes, MD [place]             empagliflozin (JARDIANCE) 25 MG TABS tablet 90 tablet 1    Sig: TAKE ONE TABLET BY MOUTH BEFORE BREAKFAST     Endocrinology:  Diabetes - SGLT2 Inhibitors Passed - 09/30/2022  3:56 PM      Passed - Cr in normal range and within 360 days    Creat  Date Value Ref Range Status  07/18/2014 1.14 0.50 - 1.35 mg/dL Final   Creatinine, Ser  Date Value Ref  Range Status  08/26/2022 0.95 0.76 - 1.27 mg/dL Final         Passed - HBA1C is between 0 and 7.9 and within 180 days    HbA1c, POC (controlled diabetic range)  Date Value Ref Range Status  07/21/2022 6.7 0.0 - 7.0 % Final         Passed - eGFR in normal range and within 360 days    GFR, Est African American  Date Value Ref Range Status  07/18/2014 86 mL/min Final   GFR calc Af Amer  Date Value Ref Range Status  09/01/2020 92 >59 mL/min/1.73 Final    Comment:    **In accordance with recommendations from the NKF-ASN Task force,**   [facility] is in the process of updating its eGFR  calculation to the   2021 CKD-EPI creatinine equation that estimates kidney function   without a race variable.    GFR, Est Non African American  Date Value Ref Range Status  07/18/2014 74 mL/min Final    Comment:      The estimated GFR is a calculation valid for adults (>=45 years old) that uses the CKD-EPI algorithm to adjust for age and sex. It is   not to be used for children, pregnant women, hospitalized patients,    patients on dialysis, or with rapidly changing kidney function. According to the NKDEP, eGFR >89 is normal, 60-89 shows mild impairment, 30-59 shows moderate impairment, 15-29 shows severe impairment and <15 is ESRD.      GFR calc non Af Amer  Date Value Ref Range Status  09/01/2020 79 >59 mL/min/1.73 Final   eGFR  Date Value Ref Range Status  08/26/2022 92 >59 mL/min/1.73 Final         Passed - Valid encounter within last 6 months    Recent Outpatient Visits           2 months ago Type 2 diabetes mellitus with other specified complication, with long-term current use of insulin ([place])   [place], [place], MD   8 months ago Type 2 diabetes mellitus with other specified complication, with long-term current use of insulin ([place])   [place] Limon, [place], MD   1 year ago Type 2 diabetes  mellitus with other specified complication, with long-term current use of insulin ([place])   [place], Gle[place], MD   1 year ago Type 2 diabetes mellitus with other specified complication, with long-term current use of insulin ([place])   [place] Charlott Rakes, MD   1 year ago Spastic hemiparesis [facility])   [place], Charlane Ferretti, MD       Future Appointments             In 3 months Charlott Rakes, MD [place]             hydrALAZINE (APRESOLINE) 100 MG tablet 270 tablet 1    Sig: Take 1 tablet (100 mg total) by mouth 3 (three) times daily.     Cardiovascular:  Vasodilators Failed - 09/30/2022  3:56 PM      Failed - HCT in normal range and within 360 days    HCT  Date Value Ref Range Status  03/18/2019 46.2 39.0 - 52.0 % Final         Failed - HGB in normal range and within 360 days    Hemoglobin  Date Value Ref Range Status  03/18/2019 15.2 13.0 - 17.0 g/dL Final         Failed - RBC in normal range and within 360 days    RBC  Date Value Ref Range Status  03/18/2019 5.00 4.22 - 5.81 MIL/uL Final         Failed - WBC in normal range and within 360 days    WBC  Date Value Ref Range Status  03/18/2019 10.2 4.0 - 10.5 K/uL Final         Failed - PLT in normal range and within 360 days    Platelets  Date Value Ref Range Status  03/18/2019 278 150 - 400 K/uL Final  Failed - ANA Screen, Ifa, Serum in normal range and within 360 days    No results found for: "ANA", "ANATITER", "LABANTI"       Passed - Last BP in normal range    BP Readings from Last 1 Encounters:  09/29/22 127/70         Passed - Valid encounter within last 12 months    Recent Outpatient Visits           2 months ago Type 2 diabetes mellitus with other specified complication, with long-term current use of insulin ([place])   [place], [place], MD   8 months ago Type 2 diabetes mellitus with other specified complication, with long-term current use of insulin ([place])   [place], [place], MD   1 year ago Type 2 diabetes mellitus with other specified complication, with long-term current use of insulin ([place])   [place], [place], MD   1 year ago Type 2 diabetes mellitus with other specified complication, with long-term current use of insulin ([place])   [place] Charlott Rakes, MD   1 year ago Spastic hemiparesis [facility])   [place], Charlane Ferretti, MD       Future Appointments             In 3 months Charlott Rakes, MD [place]             ezetimibe (ZETIA) 10 MG tablet 90 tablet 2    Sig: Take 1 tablet (10 mg total) by mouth every evening.     Cardiovascular:  Antilipid - Sterol Transport Inhibitors Failed - 09/30/2022  3:56 PM      Failed - Lipid Panel in normal range within the last 12 months    Cholesterol, Total  Date Value Ref Range Status  07/22/2022 119 100 - 199 mg/dL Final   LDL Chol Calc (NIH)  Date Value Ref Range Status  07/22/2022 57 0 - 99 mg/dL Final   HDL  Date Value Ref Range Status  07/22/2022 43 >39 mg/dL Final   Triglycerides  Date Value Ref Range Status  07/22/2022 101 0 - 149 mg/dL Final         Passed - AST in normal range and within 360 days    AST  Date Value Ref Range Status  08/26/2022 35 0 - 40 IU/L Final         Passed - ALT in normal range and within 360 days    ALT  Date Value Ref Range Status  08/26/2022 38 0 - 44 IU/L Final         Passed - Patient is not pregnant      Passed - Valid encounter within last 12 months    Recent Outpatient Visits           2 months ago Type 2 diabetes mellitus with other specified  complication, with long-term current use of insulin ([place])   [place], [place], MD   8 months ago Type 2 diabetes mellitus with other specified complication, with long-term current use of insulin ([place])   [place], Charlane Ferretti, MD   1 year ago Type 2 diabetes mellitus with other specified complication, with long-term current use of insulin ([place])   [facility]  [place], [place], MD   1 year ago Type 2 diabetes mellitus with other specified complication, with long-term current use of insulin [facility])   [place] Charlott Rakes, MD   1 year ago Spastic hemiparesis [facility])   [place] Charlott Rakes, MD       Future Appointments             In 3 months Charlott Rakes, MD [place]             insulin glargine (LANTUS) 100 UNIT/ML Solostar Pen 15 mL 0    Sig: INJECT 10 UNITS INTO THE SKIN DAILY.     Endocrinology:  Diabetes - Insulins Passed - 09/30/2022  3:56 PM      Passed - HBA1C is between 0 and 7.9 and within 180 days    HbA1c, POC (controlled diabetic range)  Date Value Ref Range Status  07/21/2022 6.7 0.0 - 7.0 % Final         Passed - Valid encounter within last 6 months    Recent Outpatient Visits           2 months ago Type 2 diabetes mellitus with other specified complication, with long-term current use of insulin ([place])   [place], [place], MD   8 months ago Type 2 diabetes mellitus with other specified complication, with long-term current use of insulin ([place])   [place], Selden, MD   1 year ago Type 2 diabetes mellitus with other specified complication, with long-term current use of insulin ([place])   [place],  Charlane Ferretti, MD   1 year ago Type 2 diabetes mellitus with other specified complication, with long-term current use of insulin [facility])   [place] Charlott Rakes, MD   1 year ago Spastic hemiparesis [facility])   [place] Charlott Rakes, MD       Future Appointments             In 3 months Charlott Rakes, MD [place]

## 2022-10-03 ENCOUNTER — Encounter: Payer: Self-pay | Admitting: Physical Therapy

## 2022-10-03 ENCOUNTER — Ambulatory Visit: Payer: Medicare HMO | Admitting: Physical Therapy

## 2022-10-03 ENCOUNTER — Telehealth: Payer: Self-pay | Admitting: Emergency Medicine

## 2022-10-03 ENCOUNTER — Other Ambulatory Visit: Payer: Self-pay

## 2022-10-03 DIAGNOSIS — R2689 Other abnormalities of gait and mobility: Secondary | ICD-10-CM | POA: Diagnosis not present

## 2022-10-03 DIAGNOSIS — M6281 Muscle weakness (generalized): Secondary | ICD-10-CM

## 2022-10-03 DIAGNOSIS — G8929 Other chronic pain: Secondary | ICD-10-CM | POA: Diagnosis not present

## 2022-10-03 DIAGNOSIS — R2681 Unsteadiness on feet: Secondary | ICD-10-CM

## 2022-10-03 DIAGNOSIS — I69351 Hemiplegia and hemiparesis following cerebral infarction affecting right dominant side: Secondary | ICD-10-CM

## 2022-10-03 DIAGNOSIS — Z794 Long term (current) use of insulin: Secondary | ICD-10-CM

## 2022-10-03 DIAGNOSIS — I6930 Unspecified sequelae of cerebral infarction: Secondary | ICD-10-CM

## 2022-10-03 DIAGNOSIS — Z720 Tobacco use: Secondary | ICD-10-CM

## 2022-10-03 DIAGNOSIS — M25511 Pain in right shoulder: Secondary | ICD-10-CM | POA: Diagnosis not present

## 2022-10-03 DIAGNOSIS — I152 Hypertension secondary to endocrine disorders: Secondary | ICD-10-CM

## 2022-10-03 MED ORDER — AMLODIPINE BESYLATE 10 MG PO TABS: ORAL_TABLET | ORAL | 1 refills | Status: DC

## 2022-10-03 MED ORDER — CARVEDILOL 12.5 MG PO TABS: ORAL_TABLET | ORAL | 1 refills | Status: DC

## 2022-10-03 MED ORDER — BACLOFEN 10 MG PO TABS: 30.0000 mg | ORAL_TABLET | Freq: Three times a day (TID) | ORAL | 2 refills | Status: DC

## 2022-10-03 MED ORDER — ASPIRIN 81 MG PO TBEC: 81.0000 mg | DELAYED_RELEASE_TABLET | Freq: Every day | ORAL | 0 refills | Status: AC

## 2022-10-03 MED ORDER — EZETIMIBE 10 MG PO TABS: 10.0000 mg | ORAL_TABLET | Freq: Every evening | ORAL | 2 refills | Status: DC

## 2022-10-03 MED ORDER — EMPAGLIFLOZIN 25 MG PO TABS: ORAL_TABLET | ORAL | 1 refills | Status: DC

## 2022-10-03 MED ORDER — INSULIN GLARGINE 100 UNIT/ML SOLOSTAR PEN: 10.0000 [IU] | PEN_INJECTOR | Freq: Every day | SUBCUTANEOUS | 0 refills | Status: DC

## 2022-10-03 MED ORDER — HYDRALAZINE HCL 100 MG PO TABS: 100.0000 mg | ORAL_TABLET | Freq: Three times a day (TID) | ORAL | 1 refills | Status: DC

## 2022-10-03 MED ORDER — ROSUVASTATIN CALCIUM 40 MG PO TABS: 40.0000 mg | ORAL_TABLET | Freq: Every day | ORAL | 2 refills | Status: DC

## 2022-10-03 MED ORDER — BUPROPION HCL ER (XL) 150 MG PO TB24: 150.0000 mg | ORAL_TABLET | Freq: Every day | ORAL | 1 refills | Status: DC

## 2022-10-03 NOTE — Telephone Encounter (Signed)
Copied from [place] [id number]. Topic: General - Other >> Oct 03, 2022  9:57 AM Oley Balm A wrote: Reason for CRM: Stanton Kidney from [employer] is calling to see if PCP received the fax regarding pts prescriptions. Stanton Kidney states she sent over a fax on 09/27/2022 and also another one today.  Please call [facility] back.

## 2022-10-03 NOTE — Therapy (Signed)
OUTPATIENT PHYSICAL THERAPY NEURO TREATMENT  Patient Name: Casey Petty MRN: [id number] DOB:Feb 02, 1961, 62 y.o., male Today's Date: 10/03/2022   PCP: Charlott Rakes REFERRING PROVIDER: Charlott Rakes  END OF SESSION:  PT End of Session - 10/03/22 0748     Visit Number 15    Date for PT Re-Evaluation 10/19/22    Authorization Time Period [facility] Medicare    PT Start Time [phone]    PT Stop Time 0839    PT Time Calculation (min) 51 min    Activity Tolerance Patient tolerated treatment well    Behavior During Therapy [facility] for tasks assessed/performed                 Past Medical History:  Diagnosis Date   Diabetes mellitus without complication ([place])    on meds   Hyperlipidemia    on meds   Hypertension    on meds   Stroke ([employer]) 2020   R hemiparesis-abnormal gait   Past Surgical History:  Procedure Laterality Date   WISDOM TOOTH EXTRACTION Bilateral 06/2021   Patient Active Problem List   Diagnosis Date Noted   Chronic right shoulder pain 06/22/2020   Adhesive capsulitis of right shoulder 10/07/2019   Abnormality of gait 09/17/2019   Spastic hemiparesis of right dominant side ([place]) 07/02/2019   History of CVA with residual deficit 06/09/2019   Spastic hemiparesis ([place])    Uncontrolled type 2 diabetes mellitus with hyperglycemia ([place])    Noncompliance    Benign essential HTN    Newly diagnosed diabetes ([place])    Leucocytosis    Right hemiparesis ([place])    Basal ganglia stroke ([place]) 03/04/2019   Stroke ([place]) 02/28/2019   Essential hypertension 02/28/2019   Tobacco abuse 02/28/2019   Hyperglycemia 02/28/2019    ONSET DATE: 2020  REFERRING DIAG: G81.10  THERAPY DIAG:  Spastic hemiparesis of right dominant side as late effect of cerebral infarction ([place])  Other abnormalities of gait and mobility  Muscle weakness (generalized)  Unsteadiness on feet  Hemiplegia and hemiparesis following cerebral infarction affecting right dominant side ([place])  Rationale  for Evaluation and Treatment: Rehabilitation  SUBJECTIVE:                                                                                                                                                                                             SUBJECTIVE STATEMENT: No falls, the botox definitely helped my arm, it is looser and hangs down a little more   Pt accompanied by: significant other  PERTINENT HISTORY: stroke in 2020  PAIN:  Are you having pain? No  PRECAUTIONS: None  WEIGHT BEARING  RESTRICTIONS: No  FALLS: Has patient fallen in last 6 months? No  LIVING ENVIRONMENT: Lives with: lives with their spouse Lives in: House/apartment Stairs: No Has following equipment at home: Single point cane, Environmental consultant - 4 wheeled, Wheelchair (manual), and Ramped entry  PLOF: Independent and Independent with basic ADLs  PATIENT GOALS: I want to get stronger and walk better  OBJECTIVE:   COGNITION: Overall cognitive status: Within functional limits for tasks assessed   SENSATION: WFL  POSTURE: rounded shoulders, forward head, flexed trunk , and weight shift left  LOWER EXTREMITY ROM:     Active  Right Eval Left Eval  Hip flexion Impaired d/t stroke unable to do actively   Hip extension    Hip abduction    Hip adduction    Hip internal rotation    Hip external rotation    Knee flexion Can do actively but uncontrolled   Knee extension WFL   Ankle dorsiflexion Foot drop, wears an AFO   Ankle plantarflexion    Ankle inversion    Ankle eversion     (Blank rows = not tested)  LOWER EXTREMITY MMT:    MMT Right Eval Left Eval  Hip flexion 2+ 5  Hip extension    Hip abduction    Hip adduction    Hip internal rotation    Hip external rotation    Knee flexion 3+   Knee extension 3+ 5  Ankle dorsiflexion    Ankle plantarflexion    Ankle inversion    Ankle eversion    (Blank rows = not tested)  GAIT: Gait pattern: decreased arm swing- Right, decreased stance time-  Right, decreased stride length, decreased hip/knee flexion- Right, decreased ankle dorsiflexion- Right, circumduction- Right, shuffling, scissoring, ataxic, lateral lean- Left, narrow BOS, and poor foot clearance- Right Distance walked: in clinic distances  Assistive device utilized: Single point cane Level of assistance: Modified independence Comments: decreased safety awareness, some toe catching  FUNCTIONAL TESTS:  5 times sit to stand: 14.06s Timed up and go (TUG): 19.47s,  08/30/22 = 13 s, 09/26/22 = 13 seconds without SPC   TODAY'S TREATMENT:                                                                                                                              DATE:  10/03/22 Nustep level 6 x 6 minutes LE only 5# leg extension 2x10 right only 15# leg curls 2x10 right only Walking on the mat in the floor figure 8's CGA 30# resisted gait all directions Volleyball standing Leg press 60# 2x10, right only 30# 2x10 20# farmer carry left arm, 5# right arm  09/30/22 NuStep L5 x69mns Prone leg ext RLE 2x10 Sidelying abd 2x10- poor control  SLR 2x10  Bridges 2x10 Passive stretching of hamstrings 30s  STS 2x10 Step overs 6"  Side steps on foam  Leg press 60# 2x10, RLE 20# x10    09/26/22 Nustep LE only Level 6 x  6 minutes Arm pulls and rows single arms at a time with 5# while standing on the airex for balance Leg press 40# 2x10 both legs, 40# right leg x 10, then 30# right leg x 10 10# right arm farmer carry 1 lap rest and then again, had two times required mod A due to catching right foot Marches in Pbars, side stepping in pbars Bridges Supine trunk rotation Partial sit ups Tmill offf pushes with Mod A Red tband HS curls right Sit to stand on airex with 6# in left hand reach up Prone HS curls  09/23/22 NuStep L5 x15mns Leg ext 5# RLE 2x10 HS curls 15# 2x10 Standing on airex hitting ball in // bars  Marching 3# in // bars Step over 4" on stairs Calf raises STS on  airex with overhead reach both arms  09/19/22 NuStep L5 x667ms Obstacles course- stepping over half foam and rolled up mat, step up 4", step up on airex  Volleyball hits standing Volleyball kicks x5 each leg  STS on airex 2x10 LAQ 5# 2x10 HS curls blue 2x10 Leg press 60# 2x10, 20# RLE x10   09/16/22 Progress note NuStep L5 x6m70m Box taps 6" standing on airex- holding SPC, CGA Calf stretch on bar 30s Calf raises 2x10 on bar  HS curls 15# 2x10 RLE Leg ext 5# RLE 2x10 Leg press 60# x10, 20# x10 RLE only   09/07/22 Nustep level 5 LE only x 6 minutes Figure 8's walking on the mat for balance Obstacle course working on step ups and negotiating around objCon-wayd then alternating kicks In Pbars airex beam side stepping with various hands to no hands Minitramp bounce and march Leg extension 5# right only 2x10 Leg curls 10# right only 2x10 Feet on ball K2C, trunk rotation, bridges, isometric abs 15# left and right arm pulls for core activation and balance 60# leg press both legs  09/05/22 NuStep L5 x6mi45m 5# right leg only leg extension 2x10 10# right only leg curls 2x10 Obstacle course- 4" step up, walk around cones, step up on airex  Ladder drill one foot in each box and then side steps STS 2x10 Leg press 60# both x10, right only 20# 2x5  09/01/22 Nustep level 6 x 6 minutes LE only Volleyball standing Volley ball on airex Elliptical 2 minutes CGA 6" toe touches 5# right leg only leg extension 10# right only leg curls Single arm pulls left 15#, right 5# Chest press 5# with mild assist to wrist right arm only Leg press 60# both 2x10, right only 20# Gait with SPC negotiating over 4" and 6" steps in the open  1/232/24 Nustep level 6 x 6 minutes TUG no device 13 seconds Volleyball standing Leg extension 5# cues for TKE Leg curls 10# 2 x10 10# left arm pulls , right arm 5# Leg press 20# x10, then right only with some help to protect knee 3x5  reps Lats 15# 2x6 reps Cone toe touches  Cone hand touches on airex 6" toe clears trying to limit circumduction Hip abduction right Fast walking with use of gait belt, no device 4" step negotiating open floor  PATIENT EDUCATION: Education details: HEP and POC Person educated: Patient Education method: Explanation Education comprehension: verbalized understanding  HOME EXERCISE PROGRAM: Access Code: [email or web address]: [email or web address] Date: 08/03/2022 Prepared by: [place] Counter Support  - 1 x daily - 7 x weekly - 2 sets - 10 reps - Standing Hip Extension  with Unilateral Counter Support  - 1 x daily - 7 x weekly - 2 sets - 10 reps - Standing Hip Abduction with Unilateral Counter Support  - 1 x daily - 7 x weekly - 2 sets - 10 reps - Seated Hip Adduction Isometrics with Ball  - 1 x daily - 7 x weekly - 2 sets - 10 reps  GOALS: Goals reviewed with patient? Yes  SHORT TERM GOALS: Target date: 09/07/22  Patient will be independent with initial HEP. Goal status: met  2. Patient will improved 5xSTS to <12s Baseline: 14.06s, 9.87s-09/16/22 Goal status: MET   LONG TERM GOALS: Target date: 10/19/22  Patient will be independent with advanced/ongoing HEP to improve outcomes and carryover.  Goal status: INITIAL  2.  Patient will demonstrate decreased fall risk by scoring < 15 sec on TUG. Baseline: 19.47s, 13.49s-09/16/22 Goal status: MET  3.  Patient will be able to ambulate 600' with LRAD with good safety to access community.  Baseline: ambulates with SPC and w/o in home Goal status:progressing  4.  Patient will demonstrate improved functional LE strength as demonstrated by 4/5 or better. Goal status: ongoing  5.  Patient will demonstrate at least 20/30 on FGA to improve gait stability and reduce risk for falls. Baseline: 13/30, 16/30- 09/16/22 Goal status: IN PROGRESS  6.  Patient will demonstrate gait speed of >/= 1.8 ft/sec  (0.55 m/s) to be a safe limited community ambulator with decreased risk for recurrent falls.  Baseline: 1.25 ft/sec, 1.48 ft/sec Goal status: progressing    ASSESSMENT:  CLINICAL IMPRESSION: Patient received botox shots last week, his arm does appear more relaxed.   His arm is moving better and fist is not so balled up. I added the resisted gait and walking on the mat, both of these were difficult for him, the resisted gait was very difficult and he reported fatigue  OBJECTIVE IMPAIRMENTS: Abnormal gait, decreased balance, decreased mobility, difficulty walking, decreased ROM, decreased strength, decreased safety awareness, increased fascial restrictions, impaired tone, and impaired UE functional use.   REHAB POTENTIAL: Fair chronicity of stroke  CLINICAL DECISION MAKING: Stable/uncomplicated  EVALUATION COMPLEXITY: Low  PLAN:  PT FREQUENCY: 1-2x/week  PT DURATION: 10 weeks  PLANNED INTERVENTIONS: Therapeutic exercises, Therapeutic activity, Neuromuscular re-education, Balance training, Gait training, Patient/Family education, Self Care, Joint mobilization, Stair training, Dry Needling, Electrical stimulation, Cryotherapy, Moist heat, Ionotophoresis '4mg'$ /ml Dexamethasone, and Manual therapy  PLAN FOR NEXT SESSION: gait training, obstacle course with step ups and airex    Sumner Boast, PT 10/03/2022, 7:49 AM

## 2022-10-03 NOTE — Telephone Encounter (Signed)
Patient was called and asked if he wanted to [place] [facility] or stay with [facility], patient states that he wants to [facility] all medication has been sent to [facility].

## 2022-10-05 ENCOUNTER — Encounter: Payer: Self-pay | Admitting: Physical Therapy

## 2022-10-05 ENCOUNTER — Ambulatory Visit: Payer: Medicare HMO | Admitting: Physical Therapy

## 2022-10-05 DIAGNOSIS — G8929 Other chronic pain: Secondary | ICD-10-CM

## 2022-10-05 DIAGNOSIS — M6281 Muscle weakness (generalized): Secondary | ICD-10-CM

## 2022-10-05 DIAGNOSIS — I69351 Hemiplegia and hemiparesis following cerebral infarction affecting right dominant side: Secondary | ICD-10-CM | POA: Diagnosis not present

## 2022-10-05 DIAGNOSIS — R2681 Unsteadiness on feet: Secondary | ICD-10-CM | POA: Diagnosis not present

## 2022-10-05 DIAGNOSIS — M25511 Pain in right shoulder: Secondary | ICD-10-CM | POA: Diagnosis not present

## 2022-10-05 DIAGNOSIS — R2689 Other abnormalities of gait and mobility: Secondary | ICD-10-CM

## 2022-10-05 NOTE — Therapy (Signed)
OUTPATIENT PHYSICAL THERAPY NEURO TREATMENT  Patient Name: Casey Petty MRN: [id number] DOB:1960-11-29, 62 y.o., male Today's Date: 10/05/2022   PCP: Charlott Rakes REFERRING PROVIDER: Charlott Rakes  END OF SESSION:  PT End of Session - 10/05/22 0746     Visit Number 16    Date for PT Re-Evaluation 10/19/22    Authorization Time Period [facility] Medicare    PT Start Time 0745    PT Stop Time 0835    PT Time Calculation (min) 50 min    Activity Tolerance Patient tolerated treatment well    Behavior During Therapy [facility] for tasks assessed/performed                 Past Medical History:  Diagnosis Date   Diabetes mellitus without complication ([place])    on meds   Hyperlipidemia    on meds   Hypertension    on meds   Stroke ([place]) 2020   R hemiparesis-abnormal gait   Past Surgical History:  Procedure Laterality Date   WISDOM TOOTH EXTRACTION Bilateral 06/2021   Patient Active Problem List   Diagnosis Date Noted   Chronic right shoulder pain 06/22/2020   Adhesive capsulitis of right shoulder 10/07/2019   Abnormality of gait 09/17/2019   Spastic hemiparesis of right dominant side ([place]) 07/02/2019   History of CVA with residual deficit 06/09/2019   Spastic hemiparesis ([place])    Uncontrolled type 2 diabetes mellitus with hyperglycemia ([place])    Noncompliance    Benign essential HTN    Newly diagnosed diabetes ([place])    Leucocytosis    Right hemiparesis ([place])    Basal ganglia stroke ([place]) 03/04/2019   Stroke ([place]) 02/28/2019   Essential hypertension 02/28/2019   Tobacco abuse 02/28/2019   Hyperglycemia 02/28/2019    ONSET DATE: 2020  REFERRING DIAG: G81.10  THERAPY DIAG:  Spastic hemiparesis of right dominant side as late effect of cerebral infarction ([place])  Other abnormalities of gait and mobility  Muscle weakness (generalized)  Unsteadiness on feet  Hemiplegia and hemiparesis following cerebral infarction affecting right dominant side ([facility])  Chronic  right shoulder pain  Rationale for Evaluation and Treatment: Rehabilitation  SUBJECTIVE:                                                                                                                                                                                             SUBJECTIVE STATEMENT: No falls, I was a little sore, it was a good workout  Pt accompanied by: significant other  PERTINENT HISTORY: stroke in 2020  PAIN:  Are you having pain? No  PRECAUTIONS: None  WEIGHT BEARING RESTRICTIONS:  No  FALLS: Has patient fallen in last 6 months? No  LIVING ENVIRONMENT: Lives with: lives with their spouse Lives in: House/apartment Stairs: No Has following equipment at home: Single point cane, Environmental consultant - 4 wheeled, Wheelchair (manual), and Ramped entry  PLOF: Independent and Independent with basic ADLs  PATIENT GOALS: I want to get stronger and walk better  OBJECTIVE:   COGNITION: Overall cognitive status: Within functional limits for tasks assessed   SENSATION: WFL  POSTURE: rounded shoulders, forward head, flexed trunk , and weight shift left  LOWER EXTREMITY ROM:     Active  Right Eval Left Eval  Hip flexion Impaired d/t stroke unable to do actively   Hip extension    Hip abduction    Hip adduction    Hip internal rotation    Hip external rotation    Knee flexion Can do actively but uncontrolled   Knee extension WFL   Ankle dorsiflexion Foot drop, wears an AFO   Ankle plantarflexion    Ankle inversion    Ankle eversion     (Blank rows = not tested)  LOWER EXTREMITY MMT:    MMT Right Eval Left Eval  Hip flexion 2+ 5  Hip extension    Hip abduction    Hip adduction    Hip internal rotation    Hip external rotation    Knee flexion 3+   Knee extension 3+ 5  Ankle dorsiflexion    Ankle plantarflexion    Ankle inversion    Ankle eversion    (Blank rows = not tested)  GAIT: Gait pattern: decreased arm swing- Right, decreased stance time-  Right, decreased stride length, decreased hip/knee flexion- Right, decreased ankle dorsiflexion- Right, circumduction- Right, shuffling, scissoring, ataxic, lateral lean- Left, narrow BOS, and poor foot clearance- Right Distance walked: in clinic distances  Assistive device utilized: Single point cane Level of assistance: Modified independence Comments: decreased safety awareness, some toe catching  FUNCTIONAL TESTS:  5 times sit to stand: 14.06s Timed up and go (TUG): 19.47s,  08/30/22 = 13 s, 09/26/22 = 13 seconds without SPC   TODAY'S TREATMENT:                                                                                                                              DATE:  10/05/22 Nustep level 6 x 6 minutes 20# farmer carry left arm, 8 # right arm Volley ball standing In Pbars marching, hip abduction and extension Resisted walking 30# all directions On airex marching with [facility] getting gup from kneeling on large mat and then on airex pad Feet on ball K2C, trunk rotation, small bridge, isometric abs Piriformis stretch Partial sit ups 60# leg press 2x10, right only 30# 2x10   10/03/22 Nustep level 6 x 6 minutes LE only 5# leg extension 2x10 right only 15# leg curls 2x10 right only Walking on the mat in the floor figure 8's CGA 30# resisted gait all directions  Volleyball standing Leg press 60# 2x10, right only 30# 2x10 20# farmer carry left arm, 5# right arm  09/30/22 NuStep L5 x8mns Prone leg ext RLE 2x10 Sidelying abd 2x10- poor control  SLR 2x10  Bridges 2x10 Passive stretching of hamstrings 30s  STS 2x10 Step overs 6"  Side steps on foam  Leg press 60# 2x10, RLE 20# x10    09/26/22 Nustep LE only Level 6 x 6 minutes Arm pulls and rows single arms at a time with 5# while standing on the airex for balance Leg press 40# 2x10 both legs, 40# right leg x 10, then 30# right leg x 10 10# right arm farmer carry 1 lap rest and then again, had two times required  mod A due to catching right foot Marches in Pbars, side stepping in pbars Bridges Supine trunk rotation Partial sit ups Tmill offf pushes with Mod A Red tband HS curls right Sit to stand on airex with 6# in left hand reach up Prone HS curls  09/23/22 NuStep L5 x646ms Leg ext 5# RLE 2x10 HS curls 15# 2x10 Standing on airex hitting ball in // bars  Marching 3# in // bars Step over 4" on stairs Calf raises STS on airex with overhead reach both arms  09/19/22 NuStep L5 x6m21m Obstacles course- stepping over half foam and rolled up mat, step up 4", step up on airex  Volleyball hits standing Volleyball kicks x5 each leg  STS on airex 2x10 LAQ 5# 2x10 HS curls blue 2x10 Leg press 60# 2x10, 20# RLE x10   09/16/22 Progress note NuStep L5 x6mi21mBox taps 6" standing on airex- holding SPC, CGA Calf stretch on bar 30s Calf raises 2x10 on bar  HS curls 15# 2x10 RLE Leg ext 5# RLE 2x10 Leg press 60# x10, 20# x10 RLE only   PATIENT EDUCATION: Education details: HEP and POC Person educated: Patient Education method: Explanation Education comprehension: verbalized understanding  HOME EXERCISE PROGRAM: Access Code: [email or web address]: [email or web address] Date: 08/03/2022 Prepared by: Mona[place]h Counter Support  - 1 x daily - 7 x weekly - 2 sets - 10 reps - Standing Hip Extension with Unilateral Counter Support  - 1 x daily - 7 x weekly - 2 sets - 10 reps - Standing Hip Abduction with Unilateral Counter Support  - 1 x daily - 7 x weekly - 2 sets - 10 reps - Seated Hip Adduction Isometrics with Ball  - 1 x daily - 7 x weekly - 2 sets - 10 reps  GOALS: Goals reviewed with patient? Yes  SHORT TERM GOALS: Target date: 09/07/22  Patient will be independent with initial HEP. Goal status: met  2. Patient will improved 5xSTS to <12s Baseline: 14.06s, 9.87s-09/16/22 Goal status: MET   LONG TERM GOALS: Target date: 10/19/22  Patient will  be independent with advanced/ongoing HEP to improve outcomes and carryover.  Goal status: ongoing  2.  Patient will demonstrate decreased fall risk by scoring < 15 sec on TUG. Baseline: 19.47s, 13.49s-09/16/22 Goal status: MET  3.  Patient will be able to ambulate 600' with LRAD with good safety to access community.  Baseline: ambulates with SPC and w/o in home Goal status:progressing  4.  Patient will demonstrate improved functional LE strength as demonstrated by 4/5 or better. Goal status: ongoing  5.  Patient will demonstrate at least 20/30 on FGA to improve gait stability and reduce risk for falls. Baseline: 13/30, 16/30- 09/16/22 Goal status: IN PROGRESS  6.  Patient will demonstrate gait speed of >/= 1.8 ft/sec (0.55 m/s) to be a safe limited community ambulator with decreased risk for recurrent falls.  Baseline: 1.25 ft/sec, 1.48 ft/sec Goal status: progressing    ASSESSMENT:  CLINICAL IMPRESSION: Patient doing well, he reports that he feels like he is picking up his foot better and is feeling stronger.  He has not had any recent falls.  He at times is a little impulsive and tends to hurry and not pick up the right foot causing a toe catch, he will catch himself without help from PT.  OBJECTIVE IMPAIRMENTS: Abnormal gait, decreased balance, decreased mobility, difficulty walking, decreased ROM, decreased strength, decreased safety awareness, increased fascial restrictions, impaired tone, and impaired UE functional use.   REHAB POTENTIAL: Fair chronicity of stroke  CLINICAL DECISION MAKING: Stable/uncomplicated  EVALUATION COMPLEXITY: Low  PLAN:  PT FREQUENCY: 1-2x/week  PT DURATION: 10 weeks  PLANNED INTERVENTIONS: Therapeutic exercises, Therapeutic activity, Neuromuscular re-education, Balance training, Gait training, Patient/Family education, Self Care, Joint mobilization, Stair training, Dry Needling, Electrical stimulation, Cryotherapy, Moist heat, Ionotophoresis  '4mg'$ /ml Dexamethasone, and Manual therapy  PLAN FOR NEXT SESSION: gait training, obstacle course with step ups and airex    Sumner Boast, PT 10/05/2022, 7:47 AM

## 2022-10-07 NOTE — Telephone Encounter (Signed)
All medications that were prescribed by PCP has been sent to pharmacy on 10/03/2022

## 2022-10-07 NOTE — Telephone Encounter (Signed)
Stanton Kidney called back from [facility] reporting that they have not received the patient's full med list

## 2022-10-07 NOTE — Therapy (Signed)
OUTPATIENT PHYSICAL THERAPY NEURO TREATMENT  Patient Name: Casey Petty MRN: [id number] DOB:Dec 14, 1960, 62 y.o., male Today's Date: 10/10/2022   PCP: Charlott Rakes REFERRING PROVIDER: Charlott Rakes  END OF SESSION:  PT End of Session - 10/10/22 0756     Visit Number 17    Date for PT Re-Evaluation 10/19/22    Authorization Time Period [facility] Medicare    PT Start Time [id number]    PT Stop Time 0835    PT Time Calculation (min) 45 min    Activity Tolerance Patient tolerated treatment well    Behavior During Therapy [facility] for tasks assessed/performed                 Past Medical History:  Diagnosis Date   Diabetes mellitus without complication ([place])    on meds   Hyperlipidemia    on meds   Hypertension    on meds   Stroke ([place]) 2020   R hemiparesis-abnormal gait   Past Surgical History:  Procedure Laterality Date   WISDOM TOOTH EXTRACTION Bilateral 06/2021   Patient Active Problem List   Diagnosis Date Noted   Chronic right shoulder pain 06/22/2020   Adhesive capsulitis of right shoulder 10/07/2019   Abnormality of gait 09/17/2019   Spastic hemiparesis of right dominant side ([place]) 07/02/2019   History of CVA with residual deficit 06/09/2019   Spastic hemiparesis ([place])    Uncontrolled type 2 diabetes mellitus with hyperglycemia ([place])    Noncompliance    Benign essential HTN    Newly diagnosed diabetes ([place])    Leucocytosis    Right hemiparesis ([place])    Basal ganglia stroke ([place]) 03/04/2019   Stroke ([place]) 02/28/2019   Essential hypertension 02/28/2019   Tobacco abuse 02/28/2019   Hyperglycemia 02/28/2019    ONSET DATE: 2020  REFERRING DIAG: G81.10  THERAPY DIAG:  Spastic hemiparesis of right dominant side as late effect of cerebral infarction ([place])  Other abnormalities of gait and mobility  Muscle weakness (generalized)  Unsteadiness on feet  Rationale for Evaluation and Treatment: Rehabilitation  SUBJECTIVE:                                                                                                                                                                                              SUBJECTIVE STATEMENT: No falls, I was a little sore, it was a good workout  Pt accompanied by: significant other  PERTINENT HISTORY: stroke in 2020  PAIN:  Are you having pain? No  PRECAUTIONS: None  WEIGHT BEARING RESTRICTIONS: No  FALLS: Has patient fallen in last 6 months? No  LIVING ENVIRONMENT: Lives with: lives  with their spouse Lives in: House/apartment Stairs: No Has following equipment at home: Single point cane, Environmental consultant - 4 wheeled, Wheelchair (manual), and Ramped entry  PLOF: Independent and Independent with basic ADLs  PATIENT GOALS: I want to get stronger and walk better  OBJECTIVE:   COGNITION: Overall cognitive status: Within functional limits for tasks assessed   SENSATION: WFL  POSTURE: rounded shoulders, forward head, flexed trunk , and weight shift left  LOWER EXTREMITY ROM:     Active  Right Eval Left Eval  Hip flexion Impaired d/t stroke unable to do actively   Hip extension    Hip abduction    Hip adduction    Hip internal rotation    Hip external rotation    Knee flexion Can do actively but uncontrolled   Knee extension WFL   Ankle dorsiflexion Foot drop, wears an AFO   Ankle plantarflexion    Ankle inversion    Ankle eversion     (Blank rows = not tested)  LOWER EXTREMITY MMT:    MMT Right Eval Left Eval  Hip flexion 2+ 5  Hip extension    Hip abduction    Hip adduction    Hip internal rotation    Hip external rotation    Knee flexion 3+   Knee extension 3+ 5  Ankle dorsiflexion    Ankle plantarflexion    Ankle inversion    Ankle eversion    (Blank rows = not tested)  GAIT: Gait pattern: decreased arm swing- Right, decreased stance time- Right, decreased stride length, decreased hip/knee flexion- Right, decreased ankle dorsiflexion- Right, circumduction- Right,  shuffling, scissoring, ataxic, lateral lean- Left, narrow BOS, and poor foot clearance- Right Distance walked: in clinic distances  Assistive device utilized: Single point cane Level of assistance: Modified independence Comments: decreased safety awareness, some toe catching  FUNCTIONAL TESTS:  5 times sit to stand: 14.06s Timed up and go (TUG): 19.47s,  08/30/22 = 13 s, 09/26/22 = 13 seconds without SPC   TODAY'S TREATMENT:                                                                                                                              DATE:  10/10/22 NuStep  Partial sit ups 2x10 Standing on airex heel taps 6"  Step ups on BOSU in bars  Mini squats on BOSU 2x10 in bars  Standing on airex hitting ball  Leg ext 10# 2x10 RLE HS curls 15# 2x10 RLE  STS on airex 2x10    10/05/22 Nustep level 6 x 6 minutes 20# farmer carry left arm, 8 # right arm Volley ball standing In Pbars marching, hip abduction and extension Resisted walking 30# all directions On airex marching with [facility] getting gup from kneeling on large mat and then on airex pad Feet on ball K2C, trunk rotation, small bridge, isometric abs Piriformis stretch Partial sit ups 60# leg press 2x10, right only 30# 2x10  10/03/22 Nustep level 6 x 6 minutes LE only 5# leg extension 2x10 right only 15# leg curls 2x10 right only Walking on the mat in the floor figure 8's CGA 30# resisted gait all directions Volleyball standing Leg press 60# 2x10, right only 30# 2x10 20# farmer carry left arm, 5# right arm  09/30/22 NuStep L5 x59mns Prone leg ext RLE 2x10 Sidelying abd 2x10- poor control  SLR 2x10  Bridges 2x10 Passive stretching of hamstrings 30s  STS 2x10 Step overs 6"  Side steps on foam  Leg press 60# 2x10, RLE 20# x10    09/26/22 Nustep LE only Level 6 x 6 minutes Arm pulls and rows single arms at a time with 5# while standing on the airex for balance Leg press 40# 2x10 both legs, 40# right leg  x 10, then 30# right leg x 10 10# right arm farmer carry 1 lap rest and then again, had two times required mod A due to catching right foot Marches in Pbars, side stepping in pbars Bridges Supine trunk rotation Partial sit ups Tmill offf pushes with Mod A Red tband HS curls right Sit to stand on airex with 6# in left hand reach up Prone HS curls  09/23/22 NuStep L5 x640ms Leg ext 5# RLE 2x10 HS curls 15# 2x10 Standing on airex hitting ball in // bars  Marching 3# in // bars Step over 4" on stairs Calf raises STS on airex with overhead reach both arms  09/19/22 NuStep L5 x6m21m Obstacles course- stepping over half foam and rolled up mat, step up 4", step up on airex  Volleyball hits standing Volleyball kicks x5 each leg  STS on airex 2x10 LAQ 5# 2x10 HS curls blue 2x10 Leg press 60# 2x10, 20# RLE x10   09/16/22 Progress note NuStep L5 x6mi37mBox taps 6" standing on airex- holding SPC, CGA Calf stretch on bar 30s Calf raises 2x10 on bar  HS curls 15# 2x10 RLE Leg ext 5# RLE 2x10 Leg press 60# x10, 20# x10 RLE only   PATIENT EDUCATION: Education details: HEP and POC Person educated: Patient Education method: Explanation Education comprehension: verbalized understanding  HOME EXERCISE PROGRAM: Access Code: [email or web address]: [email or web address] Date: 08/03/2022 Prepared by: MonaFrontierh Counter Support  - 1 x daily - 7 x weekly - 2 sets - 10 reps - Standing Hip Extension with Unilateral Counter Support  - 1 x daily - 7 x weekly - 2 sets - 10 reps - Standing Hip Abduction with Unilateral Counter Support  - 1 x daily - 7 x weekly - 2 sets - 10 reps - Seated Hip Adduction Isometrics with Ball  - 1 x daily - 7 x weekly - 2 sets - 10 reps  GOALS: Goals reviewed with patient? Yes  SHORT TERM GOALS: Target date: 09/07/22  Patient will be independent with initial HEP. Goal status: met  2. Patient will improved 5xSTS to  <12s Baseline: 14.06s, 9.87s-09/16/22 Goal status: MET   LONG TERM GOALS: Target date: 10/19/22  Patient will be independent with advanced/ongoing HEP to improve outcomes and carryover.  Goal status: ongoing  2.  Patient will demonstrate decreased fall risk by scoring < 15 sec on TUG. Baseline: 19.47s, 13.49s-09/16/22 Goal status: MET  3.  Patient will be able to ambulate 600' with LRAD with good safety to access community.  Baseline: ambulates with SPC and w/o in home Goal status:progressing  4.  Patient will demonstrate improved functional LE strength as  demonstrated by 4/5 or better. Goal status: ongoing  5.  Patient will demonstrate at least 20/30 on FGA to improve gait stability and reduce risk for falls. Baseline: 13/30, 16/30- 09/16/22 Goal status: IN PROGRESS  6.  Patient will demonstrate gait speed of >/= 1.8 ft/sec (0.55 m/s) to be a safe limited community ambulator with decreased risk for recurrent falls.  Baseline: 1.25 ft/sec, 1.48 ft/sec Goal status: progressing    ASSESSMENT:  CLINICAL IMPRESSION: Patient doing well, continued to focus on functional strengthening and balance. More steady on airex today, does well as long as he moves slow and controls movements.   OBJECTIVE IMPAIRMENTS: Abnormal gait, decreased balance, decreased mobility, difficulty walking, decreased ROM, decreased strength, decreased safety awareness, increased fascial restrictions, impaired tone, and impaired UE functional use.   REHAB POTENTIAL: Fair chronicity of stroke  CLINICAL DECISION MAKING: Stable/uncomplicated  EVALUATION COMPLEXITY: Low  PLAN:  PT FREQUENCY: 1-2x/week  PT DURATION: 10 weeks  PLANNED INTERVENTIONS: Therapeutic exercises, Therapeutic activity, Neuromuscular re-education, Balance training, Gait training, Patient/Family education, Self Care, Joint mobilization, Stair training, Dry Needling, Electrical stimulation, Cryotherapy, Moist heat, Ionotophoresis '4mg'$ /ml  Dexamethasone, and Manual therapy  PLAN FOR NEXT SESSION: gait training, obstacle course with step ups and airex    Andris Baumann, PT 10/10/2022, 8:37 AM

## 2022-10-10 ENCOUNTER — Ambulatory Visit: Payer: Medicare HMO | Attending: Family Medicine

## 2022-10-10 DIAGNOSIS — I69351 Hemiplegia and hemiparesis following cerebral infarction affecting right dominant side: Secondary | ICD-10-CM | POA: Diagnosis not present

## 2022-10-10 DIAGNOSIS — R2681 Unsteadiness on feet: Secondary | ICD-10-CM | POA: Insufficient documentation

## 2022-10-10 DIAGNOSIS — R2689 Other abnormalities of gait and mobility: Secondary | ICD-10-CM | POA: Diagnosis not present

## 2022-10-10 DIAGNOSIS — M6281 Muscle weakness (generalized): Secondary | ICD-10-CM | POA: Diagnosis not present

## 2022-10-11 NOTE — Therapy (Signed)
OUTPATIENT PHYSICAL THERAPY NEURO TREATMENT  Patient Name: Casey Petty MRN: [id number] DOB:May 04, 1961, 62 y.o., male Today's Date: 10/12/2022   PCP: Hoy Register REFERRING PROVIDER: Hoy Register  END OF SESSION:  PT End of Session - 10/12/22 0846     Visit Number 18    Date for PT Re-Evaluation 10/19/22    Authorization Time Period [employer] Medicare    PT Start Time 0845    PT Stop Time 0930    PT Time Calculation (min) 45 min    Activity Tolerance Patient tolerated treatment well    Behavior During Therapy [facility] for tasks assessed/performed                  Past Medical History:  Diagnosis Date   Diabetes mellitus without complication ([facility])    on meds   Hyperlipidemia    on meds   Hypertension    on meds   Stroke ([facility]) 2020   R hemiparesis-abnormal gait   Past Surgical History:  Procedure Laterality Date   WISDOM TOOTH EXTRACTION Bilateral 06/2021   Patient Active Problem List   Diagnosis Date Noted   Chronic right shoulder pain 06/22/2020   Adhesive capsulitis of right shoulder 10/07/2019   Abnormality of gait 09/17/2019   Spastic hemiparesis of right dominant side ([facility]) 07/02/2019   History of CVA with residual deficit 06/09/2019   Spastic hemiparesis ([facility])    Uncontrolled type 2 diabetes mellitus with hyperglycemia ([facility])    Noncompliance    Benign essential HTN    Newly diagnosed diabetes ([facility])    Leucocytosis    Right hemiparesis ([facility])    Basal ganglia stroke ([facility]) 03/04/2019   Stroke ([facility]) 02/28/2019   Essential hypertension 02/28/2019   Tobacco abuse 02/28/2019   Hyperglycemia 02/28/2019    ONSET DATE: 2020  REFERRING DIAG: G81.10  THERAPY DIAG:  Spastic hemiparesis of right dominant side as late effect of cerebral infarction ([facility])  Other abnormalities of gait and mobility  Muscle weakness (generalized)  Unsteadiness on feet  Rationale for Evaluation and Treatment: Rehabilitation  SUBJECTIVE:                                                                                                                                                                                              SUBJECTIVE STATEMENT: No falling or anything.   Pt accompanied by: significant other  PERTINENT HISTORY: stroke in 2020  PAIN:  Are you having pain? No  PRECAUTIONS: None  WEIGHT BEARING RESTRICTIONS: No  FALLS: Has patient fallen in last 6 months? No  LIVING ENVIRONMENT: Lives with: lives with their spouse Lives in: House/apartment  Stairs: No Has following equipment at home: Single point cane, Walker - 4 wheeled, Wheelchair (manual), and Ramped entry  PLOF: Independent and Independent with basic ADLs  PATIENT GOALS: I want to get stronger and walk better  OBJECTIVE:   COGNITION: Overall cognitive status: Within functional limits for tasks assessed   SENSATION: WFL  POSTURE: rounded shoulders, forward head, flexed trunk , and weight shift left  LOWER EXTREMITY ROM:     Active  Right Eval Left Eval  Hip flexion Impaired d/t stroke unable to do actively   Hip extension    Hip abduction    Hip adduction    Hip internal rotation    Hip external rotation    Knee flexion Can do actively but uncontrolled   Knee extension WFL   Ankle dorsiflexion Foot drop, wears an AFO   Ankle plantarflexion    Ankle inversion    Ankle eversion     (Blank rows = not tested)  LOWER EXTREMITY MMT:    MMT Right Eval Left Eval Right 10/12/22  Hip flexion 2+ 5 2+  Hip extension     Hip abduction     Hip adduction     Hip internal rotation     Hip external rotation     Knee flexion 3+  4+  Knee extension 3+ 5 4+  Ankle dorsiflexion     Ankle plantarflexion     Ankle inversion     Ankle eversion     (Blank rows = not tested)  GAIT: Gait pattern: decreased arm swing- Right, decreased stance time- Right, decreased stride length, decreased hip/knee flexion- Right, decreased ankle dorsiflexion- Right, circumduction- Right,  shuffling, scissoring, ataxic, lateral lean- Left, narrow BOS, and poor foot clearance- Right Distance walked: in clinic distances  Assistive device utilized: Single point cane Level of assistance: Modified independence Comments: decreased safety awareness, some toe catching  FUNCTIONAL TESTS:  5 times sit to stand: 14.06s Timed up and go (TUG): 19.47s,  08/30/22 = 13 s, 09/26/22 = 13 seconds without SPC   TODAY'S TREATMENT:                                                                                                                              DATE:  10/12/22 Bike L4 x81mins MMT recheck FGA- 18/30 [place] taps standing on airex Side steps on airex holding on for support Resisted gait 20# 3 way x3 Leg press 60# 2x10   10/10/22 NuStep L5 x67mins Partial sit ups 2x10 Standing on airex heel taps 6"  Step ups on BOSU in bars  Mini squats on BOSU 2x10 in bars  Standing on airex hitting ball  Leg ext 10# 2x10 RLE HS curls 15# 2x10 RLE  STS on airex 2x10    10/05/22 Nustep level 6 x 6 minutes 20# farmer carry left arm, 8 # right arm Volley ball standing In Pbars marching, hip abduction and extension Resisted walking 30# all directions On  airex marching with [facility] getting gup from kneeling on large mat and then on airex pad Feet on ball K2C, trunk rotation, small bridge, isometric abs Piriformis stretch Partial sit ups 60# leg press 2x10, right only 30# 2x10   10/03/22 Nustep level 6 x 6 minutes LE only 5# leg extension 2x10 right only 15# leg curls 2x10 right only Walking on the mat in the floor figure 8's CGA 30# resisted gait all directions Volleyball standing Leg press 60# 2x10, right only 30# 2x10 20# farmer carry left arm, 5# right arm  09/30/22 NuStep L5 x30mins Prone leg ext RLE 2x10 Sidelying abd 2x10- poor control  SLR 2x10  Bridges 2x10 Passive stretching of hamstrings 30s  STS 2x10 Step overs 6"  Side steps on foam  Leg press 60# 2x10, RLE 20# x10     09/26/22 Nustep LE only Level 6 x 6 minutes Arm pulls and rows single arms at a time with 5# while standing on the airex for balance Leg press 40# 2x10 both legs, 40# right leg x 10, then 30# right leg x 10 10# right arm farmer carry 1 lap rest and then again, had two times required mod A due to catching right foot Marches in Pbars, side stepping in pbars Bridges Supine trunk rotation Partial sit ups Tmill offf pushes with Mod A Red tband HS curls right Sit to stand on airex with 6# in left hand reach up Prone HS curls  09/23/22 NuStep L5 x31mins Leg ext 5# RLE 2x10 HS curls 15# 2x10 Standing on airex hitting ball in // bars  Marching 3# in // bars Step over 4" on stairs Calf raises STS on airex with overhead reach both arms  09/19/22 NuStep L5 x30mins Obstacles course- stepping over half foam and rolled up mat, step up 4", step up on airex  Volleyball hits standing Volleyball kicks x5 each leg  STS on airex 2x10 LAQ 5# 2x10 HS curls blue 2x10 Leg press 60# 2x10, 20# RLE x10   09/16/22 Progress note NuStep L5 x54mins Box taps 6" standing on airex- holding SPC, CGA Calf stretch on bar 30s Calf raises 2x10 on bar  HS curls 15# 2x10 RLE Leg ext 5# RLE 2x10 Leg press 60# x10, 20# x10 RLE only   PATIENT EDUCATION: Education details: HEP and POC Person educated: Patient Education method: Explanation Education comprehension: verbalized understanding  HOME EXERCISE PROGRAM: Access Code: [email or web address] URL: [email or web address] Date: 08/03/2022 Prepared by: Cassie Freer  Exercises - Mini Squat with Counter Support  - 1 x daily - 7 x weekly - 2 sets - 10 reps - Standing Hip Extension with Unilateral Counter Support  - 1 x daily - 7 x weekly - 2 sets - 10 reps - Standing Hip Abduction with Unilateral Counter Support  - 1 x daily - 7 x weekly - 2 sets - 10 reps - Seated Hip Adduction Isometrics with Ball  - 1 x daily - 7 x weekly - 2 sets - 10  reps  GOALS: Goals reviewed with patient? Yes  SHORT TERM GOALS: Target date: 09/07/22  Patient will be independent with initial HEP. Goal status: met  2. Patient will improved 5xSTS to <12s Baseline: 14.06s, 9.87s-09/16/22 Goal status: MET   LONG TERM GOALS: Target date: 10/19/22  Patient will be independent with advanced/ongoing HEP to improve outcomes and carryover.  Goal status: ongoing  2.  Patient will demonstrate decreased fall risk by scoring < 15 sec on TUG. Baseline: 19.47s, 13.49s-09/16/22 Goal  status: MET  3.  Patient will be able to ambulate 600' with LRAD with good safety to access community.  Baseline: ambulates with SPC and w/o in home Goal status:progressing  4.  Patient will demonstrate improved functional LE strength as demonstrated by 4/5 or better. Goal status: ongoing  5.  Patient will demonstrate at least 20/30 on FGA to improve gait stability and reduce risk for falls. Baseline: 13/30, 16/30- 09/16/22, 18/30- 10/12/22 Goal status: IN PROGRESS  6.  Patient will demonstrate gait speed of >/= 1.8 ft/sec (0.55 m/s) to be a safe limited community ambulator with decreased risk for recurrent falls.  Baseline: 1.25 ft/sec, 1.48 ft/sec Goal status: progressing    ASSESSMENT:  CLINICAL IMPRESSION: Patient doing well, continued to focus on functional strengthening and balance. Still has weakness in R hip flexor but has gotten stronger in knee. 1 LOB and modA needed to catch with resisted gait but otherwise doing fine.   OBJECTIVE IMPAIRMENTS: Abnormal gait, decreased balance, decreased mobility, difficulty walking, decreased ROM, decreased strength, decreased safety awareness, increased fascial restrictions, impaired tone, and impaired UE functional use.   REHAB POTENTIAL: Fair chronicity of stroke  CLINICAL DECISION MAKING: Stable/uncomplicated  EVALUATION COMPLEXITY: Low  PLAN:  PT FREQUENCY: 1-2x/week  PT DURATION: 10 weeks  PLANNED INTERVENTIONS:  Therapeutic exercises, Therapeutic activity, Neuromuscular re-education, Balance training, Gait training, Patient/Family education, Self Care, Joint mobilization, Stair training, Dry Needling, Electrical stimulation, Cryotherapy, Moist heat, Ionotophoresis 4mg /ml Dexamethasone, and Manual therapy  PLAN FOR NEXT SESSION: gait training, obstacle course with step ups and airex    Cassie Freer, PT 10/12/2022, 9:25 AM

## 2022-10-12 ENCOUNTER — Ambulatory Visit: Payer: Medicare HMO

## 2022-10-12 DIAGNOSIS — R2681 Unsteadiness on feet: Secondary | ICD-10-CM | POA: Diagnosis not present

## 2022-10-12 DIAGNOSIS — I69351 Hemiplegia and hemiparesis following cerebral infarction affecting right dominant side: Secondary | ICD-10-CM

## 2022-10-12 DIAGNOSIS — R2689 Other abnormalities of gait and mobility: Secondary | ICD-10-CM

## 2022-10-12 DIAGNOSIS — M6281 Muscle weakness (generalized): Secondary | ICD-10-CM | POA: Diagnosis not present

## 2022-10-14 NOTE — Therapy (Signed)
OUTPATIENT PHYSICAL THERAPY NEURO TREATMENT  Patient Name: Casey Petty MRN: [id number] DOB:May 16, 1961, 62 y.o., male Today's Date: 10/14/2022   PCP: Charlott Rakes REFERRING PROVIDER: Charlott Rakes  END OF SESSION:         Past Medical History:  Diagnosis Date   Diabetes mellitus without complication ([place])    on meds   Hyperlipidemia    on meds   Hypertension    on meds   Stroke [facility]) 2020   R hemiparesis-abnormal gait   Past Surgical History:  Procedure Laterality Date   WISDOM TOOTH EXTRACTION Bilateral 06/2021   Patient Active Problem List   Diagnosis Date Noted   Chronic right shoulder pain 06/22/2020   Adhesive capsulitis of right shoulder 10/07/2019   Abnormality of gait 09/17/2019   Spastic hemiparesis of right dominant side ([place]) 07/02/2019   History of CVA with residual deficit 06/09/2019   Spastic hemiparesis ([place])    Uncontrolled type 2 diabetes mellitus with hyperglycemia ([place])    Noncompliance    Benign essential HTN    Newly diagnosed diabetes ([place])    Leucocytosis    Right hemiparesis ([place])    Basal ganglia stroke ([place]) 03/04/2019   Stroke ([place]) 02/28/2019   Essential hypertension 02/28/2019   Tobacco abuse 02/28/2019   Hyperglycemia 02/28/2019    ONSET DATE: 2020  REFERRING DIAG: G81.10  THERAPY DIAG:  No diagnosis found.  Rationale for Evaluation and Treatment: Rehabilitation  SUBJECTIVE:                                                                                                                                                                                             SUBJECTIVE STATEMENT: No falling or anything.   Pt accompanied by: significant other  PERTINENT HISTORY: stroke in 2020  PAIN:  Are you having pain? No  PRECAUTIONS: None  WEIGHT BEARING RESTRICTIONS: No  FALLS: Has patient fallen in last 6 months? No  LIVING ENVIRONMENT: Lives with: lives with their spouse Lives in: House/apartment Stairs:  No Has following equipment at home: Single point cane, Environmental consultant - 4 wheeled, Wheelchair (manual), and Ramped entry  PLOF: Independent and Independent with basic ADLs  PATIENT GOALS: I want to get stronger and walk better  OBJECTIVE:   COGNITION: Overall cognitive status: Within functional limits for tasks assessed   SENSATION: WFL  POSTURE: rounded shoulders, forward head, flexed trunk , and weight shift left  LOWER EXTREMITY ROM:     Active  Right Eval Left Eval  Hip flexion Impaired d/t stroke unable to do actively   Hip extension    Hip abduction    Hip adduction  Hip internal rotation    Hip external rotation    Knee flexion Can do actively but uncontrolled   Knee extension WFL   Ankle dorsiflexion Foot drop, wears an AFO   Ankle plantarflexion    Ankle inversion    Ankle eversion     (Blank rows = not tested)  LOWER EXTREMITY MMT:    MMT Right Eval Left Eval Right 10/12/22  Hip flexion 2+ 5 2+  Hip extension     Hip abduction     Hip adduction     Hip internal rotation     Hip external rotation     Knee flexion 3+  4+  Knee extension 3+ 5 4+  Ankle dorsiflexion     Ankle plantarflexion     Ankle inversion     Ankle eversion     (Blank rows = not tested)  GAIT: Gait pattern: decreased arm swing- Right, decreased stance time- Right, decreased stride length, decreased hip/knee flexion- Right, decreased ankle dorsiflexion- Right, circumduction- Right, shuffling, scissoring, ataxic, lateral lean- Left, narrow BOS, and poor foot clearance- Right Distance walked: in clinic distances  Assistive device utilized: Single point cane Level of assistance: Modified independence Comments: decreased safety awareness, some toe catching  FUNCTIONAL TESTS:  5 times sit to stand: 14.06s Timed up and go (TUG): 19.47s,  08/30/22 = 13 s, 09/26/22 = 13 seconds without SPC   TODAY'S TREATMENT:                                                                                                                               DATE:  10/17/22 NuStep Step up on 4", step up on airex, step over [employer] standing then on airex Walking on beam  Marching on airex   10/12/22 Bike L4 x91mns MMT recheck FGA- 18/30 [place] taps standing on airex Side steps on airex holding on for support Resisted gait 20# 3 way x3 Leg press 60# 2x10   10/10/22 NuStep L5 x644ms Partial sit ups 2x10 Standing on airex heel taps 6"  Step ups on BOSU in bars  Mini squats on BOSU 2x10 in bars  Standing on airex hitting ball  Leg ext 10# 2x10 RLE HS curls 15# 2x10 RLE  STS on airex 2x10    10/05/22 Nustep level 6 x 6 minutes 20# farmer carry left arm, 8 # right arm Volley ball standing In Pbars marching, hip abduction and extension Resisted walking 30# all directions On airex marching with [facility] getting gup from kneeling on large mat and then on airex pad Feet on ball K2C, trunk rotation, small bridge, isometric abs Piriformis stretch Partial sit ups 60# leg press 2x10, right only 30# 2x10   10/03/22 Nustep level 6 x 6 minutes LE only 5# leg extension 2x10 right only 15# leg curls 2x10 right only Walking on the mat in the floor figure 8's CGA 30# resisted gait all directions Volleyball standing Leg press 60#  2x10, right only 30# 2x10 20# farmer carry left arm, 5# right arm  09/30/22 NuStep L5 x25mns Prone leg ext RLE 2x10 Sidelying abd 2x10- poor control  SLR 2x10  Bridges 2x10 Passive stretching of hamstrings 30s  STS 2x10 Step overs 6"  Side steps on foam  Leg press 60# 2x10, RLE 20# x10    09/26/22 Nustep LE only Level 6 x 6 minutes Arm pulls and rows single arms at a time with 5# while standing on the airex for balance Leg press 40# 2x10 both legs, 40# right leg x 10, then 30# right leg x 10 10# right arm farmer carry 1 lap rest and then again, had two times required mod A due to catching right foot Marches in Pbars, side stepping in  pbars Bridges Supine trunk rotation Partial sit ups Tmill offf pushes with Mod A Red tband HS curls right Sit to stand on airex with 6# in left hand reach up Prone HS curls  09/23/22 NuStep L5 x678ms Leg ext 5# RLE 2x10 HS curls 15# 2x10 Standing on airex hitting ball in // bars  Marching 3# in // bars Step over 4" on stairs Calf raises STS on airex with overhead reach both arms  09/19/22 NuStep L5 x6m72m Obstacles course- stepping over half foam and rolled up mat, step up 4", step up on airex  Volleyball hits standing Volleyball kicks x5 each leg  STS on airex 2x10 LAQ 5# 2x10 HS curls blue 2x10 Leg press 60# 2x10, 20# RLE x10   09/16/22 Progress note NuStep L5 x6mi81mBox taps 6" standing on airex- holding SPC, CGA Calf stretch on bar 30s Calf raises 2x10 on bar  HS curls 15# 2x10 RLE Leg ext 5# RLE 2x10 Leg press 60# x10, 20# x10 RLE only   PATIENT EDUCATION: Education details: HEP and POC Person educated: Patient Education method: Explanation Education comprehension: verbalized understanding  HOME EXERCISE PROGRAM: Access Code: [email or web address]: [email or web address] Date: 08/03/2022 Prepared by: Mon[place]h Counter Support  - 1 x daily - 7 x weekly - 2 sets - 10 reps - Standing Hip Extension with Unilateral Counter Support  - 1 x daily - 7 x weekly - 2 sets - 10 reps - Standing Hip Abduction with Unilateral Counter Support  - 1 x daily - 7 x weekly - 2 sets - 10 reps - Seated Hip Adduction Isometrics with Ball  - 1 x daily - 7 x weekly - 2 sets - 10 reps  GOALS: Goals reviewed with patient? Yes  SHORT TERM GOALS: Target date: 09/07/22  Patient will be independent with initial HEP. Goal status: met  2. Patient will improved 5xSTS to <12s Baseline: 14.06s, 9.87s-09/16/22 Goal status: MET   LONG TERM GOALS: Target date: 10/19/22  Patient will be independent with advanced/ongoing HEP to improve outcomes and  carryover.  Goal status: ongoing  2.  Patient will demonstrate decreased fall risk by scoring < 15 sec on TUG. Baseline: 19.47s, 13.49s-09/16/22 Goal status: MET  3.  Patient will be able to ambulate 600' with LRAD with good safety to access community.  Baseline: ambulates with SPC and w/o in home Goal status:progressing  4.  Patient will demonstrate improved functional LE strength as demonstrated by 4/5 or better. Goal status: ongoing  5.  Patient will demonstrate at least 20/30 on FGA to improve gait stability and reduce risk for falls. Baseline: 13/30, 16/30- 09/16/22, 18/30- 10/12/22 Goal status: IN PROGRESS  6.  Patient will demonstrate gait speed of >/= 1.8 ft/sec (0.55 m/s) to be a safe limited community ambulator with decreased risk for recurrent falls.  Baseline: 1.25 ft/sec, 1.48 ft/sec Goal status: progressing    ASSESSMENT:  CLINICAL IMPRESSION: Patient doing well, continued to focus on functional strengthening and balance. Still has weakness in R hip flexor but has gotten stronger in knee. 1 LOB and modA needed to catch with resisted gait but otherwise doing fine.   OBJECTIVE IMPAIRMENTS: Abnormal gait, decreased balance, decreased mobility, difficulty walking, decreased ROM, decreased strength, decreased safety awareness, increased fascial restrictions, impaired tone, and impaired UE functional use.   REHAB POTENTIAL: Fair chronicity of stroke  CLINICAL DECISION MAKING: Stable/uncomplicated  EVALUATION COMPLEXITY: Low  PLAN:  PT FREQUENCY: 1-2x/week  PT DURATION: 10 weeks  PLANNED INTERVENTIONS: Therapeutic exercises, Therapeutic activity, Neuromuscular re-education, Balance training, Gait training, Patient/Family education, Self Care, Joint mobilization, Stair training, Dry Needling, Electrical stimulation, Cryotherapy, Moist heat, Ionotophoresis '4mg'$ /ml Dexamethasone, and Manual therapy  PLAN FOR NEXT SESSION: gait training, obstacle course with step ups and  airex    Andris Baumann, PT 10/14/2022, 8:54 AM

## 2022-10-17 ENCOUNTER — Ambulatory Visit: Payer: Medicare HMO

## 2022-10-17 DIAGNOSIS — I69351 Hemiplegia and hemiparesis following cerebral infarction affecting right dominant side: Secondary | ICD-10-CM

## 2022-10-17 DIAGNOSIS — R2689 Other abnormalities of gait and mobility: Secondary | ICD-10-CM

## 2022-10-17 DIAGNOSIS — R2681 Unsteadiness on feet: Secondary | ICD-10-CM

## 2022-10-17 DIAGNOSIS — M6281 Muscle weakness (generalized): Secondary | ICD-10-CM | POA: Diagnosis not present

## 2022-10-19 ENCOUNTER — Ambulatory Visit: Payer: Medicare HMO | Admitting: Physical Therapy

## 2022-10-19 ENCOUNTER — Encounter: Payer: Self-pay | Admitting: Physical Therapy

## 2022-10-19 DIAGNOSIS — I69351 Hemiplegia and hemiparesis following cerebral infarction affecting right dominant side: Secondary | ICD-10-CM | POA: Diagnosis not present

## 2022-10-19 DIAGNOSIS — M6281 Muscle weakness (generalized): Secondary | ICD-10-CM

## 2022-10-19 DIAGNOSIS — R2689 Other abnormalities of gait and mobility: Secondary | ICD-10-CM | POA: Diagnosis not present

## 2022-10-19 DIAGNOSIS — R2681 Unsteadiness on feet: Secondary | ICD-10-CM | POA: Diagnosis not present

## 2022-10-19 NOTE — Therapy (Signed)
OUTPATIENT PHYSICAL THERAPY NEURO TREATMENT Progress Note Reporting Period 09/19/22 to 10/19/22 for visits 11-20  See note below for Objective Data and Assessment of Progress/Goals.     Patient Name: Casey Petty MRN: [id number] DOB:[birth year], 62 y.o., male Today's Date: 10/19/2022   PCP: Charlott Rakes REFERRING PROVIDER: Charlott Rakes  END OF SESSION:  PT End of Session - 10/19/22 0753     Visit Number 20    Date for PT Re-Evaluation 10/19/22    Authorization Time Period [facility] Medicare    PT Start Time [phone]    PT Stop Time 0845    PT Time Calculation (min) 52 min    Activity Tolerance Patient tolerated treatment well    Behavior During Therapy [facility] for tasks assessed/performed                   Past Medical History:  Diagnosis Date   Diabetes mellitus without complication ([place])    on meds   Hyperlipidemia    on meds   Hypertension    on meds   Stroke ([place]) 2020   R hemiparesis-abnormal gait   Past Surgical History:  Procedure Laterality Date   WISDOM TOOTH EXTRACTION Bilateral 06/2021   Patient Active Problem List   Diagnosis Date Noted   Chronic right shoulder pain 06/22/2020   Adhesive capsulitis of right shoulder 10/07/2019   Abnormality of gait 09/17/2019   Spastic hemiparesis of right dominant side ([place]) 07/02/2019   History of CVA with residual deficit 06/09/2019   Spastic hemiparesis ([place])    Uncontrolled type 2 diabetes mellitus with hyperglycemia ([place])    Noncompliance    Benign essential HTN    Newly diagnosed diabetes ([place])    Leucocytosis    Right hemiparesis ([place])    Basal ganglia stroke ([place]) 03/04/2019   Stroke ([place]) 02/28/2019   Essential hypertension 02/28/2019   Tobacco abuse 02/28/2019   Hyperglycemia 02/28/2019    ONSET DATE: 2020  REFERRING DIAG: G81.10  THERAPY DIAG:  Spastic hemiparesis of right dominant side as late effect of cerebral infarction ([place])  Other abnormalities of gait and mobility  Muscle weakness  (generalized)  Unsteadiness on feet  Hemiplegia and hemiparesis following cerebral infarction affecting right dominant side ([place])  Rationale for Evaluation and Treatment: Rehabilitation  SUBJECTIVE:                                                                                                                                                                                             SUBJECTIVE STATEMENT: I have had some movements of my foot and toes, maybe the botox helps  Pt accompanied by:  significant other  PERTINENT HISTORY: stroke in 2020  PAIN:  Are you having pain? No  PRECAUTIONS: None  WEIGHT BEARING RESTRICTIONS: No  FALLS: Has patient fallen in last 6 months? No  LIVING ENVIRONMENT: Lives with: lives with their spouse Lives in: House/apartment Stairs: No Has following equipment at home: Single point cane, Environmental consultant - 4 wheeled, Wheelchair (manual), and Ramped entry  PLOF: Independent and Independent with basic ADLs  PATIENT GOALS: I want to get stronger and walk better  OBJECTIVE:   COGNITION: Overall cognitive status: Within functional limits for tasks assessed   SENSATION: WFL  POSTURE: rounded shoulders, forward head, flexed trunk , and weight shift left  LOWER EXTREMITY ROM:     Active  Right Eval Left Eval  Hip flexion Impaired d/t stroke unable to do actively   Hip extension    Hip abduction    Hip adduction    Hip internal rotation    Hip external rotation    Knee flexion Can do actively but uncontrolled   Knee extension WFL   Ankle dorsiflexion Foot drop, wears an AFO   Ankle plantarflexion    Ankle inversion    Ankle eversion     (Blank rows = not tested)  LOWER EXTREMITY MMT:    MMT Right Eval Left Eval Right 10/12/22  Hip flexion 2+ 5 2+  Hip extension     Hip abduction     Hip adduction     Hip internal rotation     Hip external rotation     Knee flexion 3+  4+  Knee extension 3+ 5 4+  Ankle dorsiflexion     Ankle  plantarflexion     Ankle inversion     Ankle eversion     (Blank rows = not tested)  GAIT: Gait pattern: decreased arm swing- Right, decreased stance time- Right, decreased stride length, decreased hip/knee flexion- Right, decreased ankle dorsiflexion- Right, circumduction- Right, shuffling, scissoring, ataxic, lateral lean- Left, narrow BOS, and poor foot clearance- Right Distance walked: in clinic distances  Assistive device utilized: Single point cane Level of assistance: Modified independence Comments: decreased safety awareness, some toe catching  FUNCTIONAL TESTS:  5 times sit to stand: 14.06s  10/19/22 = 12 seconds Timed up and go (TUG): 19.47s,  08/30/22 = 13 s, 09/26/22 = 13 seconds without SPC, 10/19/22 without cane =12 seconds   TODAY'S TREATMENT:                                                                                                                              DATE:  10/19/22 Nustep level 6 x 6 minutes LE's only Gait outside uneven ground, grass, slope Leg curls right only 15# Leg extension right only 5# Leg press 40# right only 2x 10 10# farmer carry one lap in each hand Education and discussed safety and HEP Tested 5XSTS and TUG as noted above  10/17/22 NuStep L5 x35mns  Step up on 4",  step up on airex, step over [place], then side ways  STS on airex 2x10  Walking on beam  Marching on airex 2x10  Leg press 60# 2x10    10/12/22 Bike L4 x24mns MMT recheck FGA- 18/30 [place] taps standing on airex Side steps on airex holding on for support Resisted gait 20# 3 way x3 Leg press 60# 2x10   10/10/22 NuStep L5 x660ms Partial sit ups 2x10 Standing on airex heel taps 6"  Step ups on BOSU in bars  Mini squats on BOSU 2x10 in bars  Standing on airex hitting ball  Leg ext 10# 2x10 RLE HS curls 15# 2x10 RLE  STS on airex 2x10    10/05/22 Nustep level 6 x 6 minutes 20# farmer carry left arm, 8 # right arm Volley ball standing In Pbars marching, hip abduction and  extension Resisted walking 30# all directions On airex marching with S[facility]ractice getting gup from kneeling on large mat and then on airex pad Feet on ball K2C, trunk rotation, small bridge, isometric abs Piriformis stretch Partial sit ups 60# leg press 2x10, right only 30# 2x10   10/03/22 Nustep level 6 x 6 minutes LE only 5# leg extension 2x10 right only 15# leg curls 2x10 right only Walking on the mat in the floor figure 8's CGA 30# resisted gait all directions Volleyball standing Leg press 60# 2x10, right only 30# 2x10 20# farmer carry left arm, 5# right arm  09/30/22 NuStep L5 x6m60m Prone leg ext RLE 2x10 Sidelying abd 2x10- poor control  SLR 2x10  Bridges 2x10 Passive stretching of hamstrings 30s  STS 2x10 Step overs 6"  Side steps on foam  Leg press 60# 2x10, RLE 20# x10    09/26/22 Nustep LE only Level 6 x 6 minutes Arm pulls and rows single arms at a time with 5# while standing on the airex for balance Leg press 40# 2x10 both legs, 40# right leg x 10, then 30# right leg x 10 10# right arm farmer carry 1 lap rest and then again, had two times required mod A due to catching right foot Marches in Pbars, side stepping in pbars Bridges Supine trunk rotation Partial sit ups Tmill offf pushes with Mod A Red tband HS curls right Sit to stand on airex with 6# in left hand reach up Prone HS curls  09/23/22 NuStep L5 x6mi11mLeg ext 5# RLE 2x10 HS curls 15# 2x10 Standing on airex hitting ball in // bars  Marching 3# in // bars Step over 4" on stairs Calf raises STS on airex with overhead reach both arms  09/19/22 NuStep L5 x6min38mbstacles course- stepping over half foam and rolled up mat, step up 4", step up on airex  Volleyball hits standing Volleyball kicks x5 each leg  STS on airex 2x10 LAQ 5# 2x10 HS curls blue 2x10 Leg press 60# 2x10, 20# RLE x10   09/16/22 Progress note NuStep L5 x6mins25mx taps 6" standing on airex- holding SPC, CGA Calf stretch  on bar 30s Calf raises 2x10 on bar  HS curls 15# 2x10 RLE Leg ext 5# RLE 2x10 Leg press 60# x10, 20# x10 RLE only   PATIENT EDUCATION: Education details: HEP and POC Person educated: Patient Education method: Explanation Education comprehension: verbalized understanding  HOME EXERCISE PROGRAM: Access Code: [email or web address] Date: 08/03/2022 Prepared by: Mona [place]yster Bay CoveCounter Support  - 1 x daily - 7 x weekly - 2 sets - 10 reps -  Standing Hip Extension with Unilateral Counter Support  - 1 x daily - 7 x weekly - 2 sets - 10 reps - Standing Hip Abduction with Unilateral Counter Support  - 1 x daily - 7 x weekly - 2 sets - 10 reps - Seated Hip Adduction Isometrics with Ball  - 1 x daily - 7 x weekly - 2 sets - 10 reps  GOALS: Goals reviewed with patient? Yes  SHORT TERM GOALS: Target date: 09/07/22  Patient will be independent with initial HEP. Goal status: met  2. Patient will improved 5xSTS to <12s Baseline: 14.06s, 9.87s-09/16/22 Goal status: MET   LONG TERM GOALS: Target date: 10/19/22  Patient will be independent with advanced/ongoing HEP to improve outcomes and carryover.  Goal status: met 10/19/22  2.  Patient will demonstrate decreased fall risk by scoring < 15 sec on TUG. Baseline: 19.47s, 13.49s-09/16/22 Goal status: MET  3.  Patient will be able to ambulate 600' with LRAD with good safety to access community.  Baseline: ambulates with SPC and w/o in home Goal status: able to do 600 feet today with [facility] 10/19/22  4.  Patient will demonstrate improved functional LE strength as demonstrated by 4/5 or better. Goal status: met for knee 10/19/22  5.  Patient will demonstrate at least 20/30 on FGA to improve gait stability and reduce risk for falls. Baseline: 13/30, 16/30- 09/16/22, 18/30- 10/12/22 Goal status:not met  6.  Patient will demonstrate gait speed of >/= 1.8 ft/sec (0.55 m/s) to be a safe limited  community ambulator with decreased risk for recurrent falls.  Baseline: 1.25 ft/sec, 1.48 ft/sec Goal status: progressing    ASSESSMENT:  CLINICAL IMPRESSION: Patient made very good gains in his strength, safety and overall functional mobility, I do think he is safer and less impulsive with some of the education, we discussed the need for him to continue with HEP and walking at home.    OBJECTIVE IMPAIRMENTS: Abnormal gait, decreased balance, decreased mobility, difficulty walking, decreased ROM, decreased strength, decreased safety awareness, increased fascial restrictions, impaired tone, and impaired UE functional use.   REHAB POTENTIAL: Fair chronicity of stroke  CLINICAL DECISION MAKING: Stable/uncomplicated  EVALUATION COMPLEXITY: Low  PLAN:  PT FREQUENCY: 1-2x/week  PT DURATION: 10 weeks  PLANNED INTERVENTIONS: Therapeutic exercises, Therapeutic activity, Neuromuscular re-education, Balance training, Gait training, Patient/Family education, Self Care, Joint mobilization, Stair training, Dry Needling, Electrical stimulation, Cryotherapy, Moist heat, Ionotophoresis '4mg'$ /ml Dexamethasone, and Manual therapy  PLAN FOR NEXT SESSION: will d/c with most goals being met and him going to walk at home and do the [employer], PT 10/19/2022, 7:54 AM

## 2022-10-20 ENCOUNTER — Ambulatory Visit: Payer: Self-pay

## 2022-10-20 NOTE — Telephone Encounter (Signed)
  Brandy from [facility] stated they need to clarify directions on two prescriptions medication baclofen (LIORESAL) 10 MG tablet, and Pen needles.  [phone]       Chief Complaint: [facility] verifying quantity on Baclofen prescription. # 30 tabs will last less than a week. Does provider want o increase this? Symptoms: n/a Frequency: n/a Pertinent Negatives: Patient denies n/a Disposition: [] ED /[] Urgent Care (no appt availability in office) / [] Appointment(In office/virtual)/ []  [place] [facility]/ [] Home Care/ [] Refused Recommended Disposition /[] [place] [facility]/ []  Follow-up with PCP Additional Notes: Please advise pharmacy.  Answer Assessment - Initial Assessment Questions 1. NAME of MEDICINE: "What medicine(s) are you calling about?"     Baclofen - quantity is 30 tablets, would last less tha a week. Does provider want to increase this? 2. QUESTION: "What is your question?" (e.g., double dose of medicine, side effect)     Above 3. PRESCRIBER: "Who prescribed the medicine?" Reason: if prescribed by specialist, call should be referred to that group.     Dr. Margarita Rana 4. SYMPTOMS: "Do you have any symptoms?" If Yes, ask: "What symptoms are you having?"  "How bad are the symptoms (e.g., mild, moderate, severe)     N/a 5. PREGNANCY:  "Is there any chance that you are pregnant?" "When was your last menstrual period?"     N/a  Protocols used: Medication Question Call-A-AH

## 2022-10-27 ENCOUNTER — Ambulatory Visit: Payer: Self-pay | Admitting: *Deleted

## 2022-10-27 NOTE — Telephone Encounter (Signed)
Summary: med instructions   [name] for [facility] called in says needs clear directions on med ,baclofen (LIORESAL) 10 MG tablet, the quantity and directions.      Called [facility] on [phone] to review medication directions. On hold for extended time unable to leave message to call back.

## 2022-10-27 NOTE — Telephone Encounter (Signed)
2nd attempt, [facility] PA called and waited on hold for 10+ mins with no answer from staff or option to LVM.

## 2022-10-29 MED ORDER — BACLOFEN 10 MG PO TABS: 20.0000 mg | ORAL_TABLET | Freq: Three times a day (TID) | ORAL | 2 refills | Status: DC

## 2022-10-29 NOTE — Addendum Note (Signed)
Addended by: Charlott Rakes on: 10/29/2022 10:42 AM   Modules accepted: Orders

## 2022-10-29 NOTE — Telephone Encounter (Signed)
The only Pharmacy I have on file for him is [facility] and I sent it there. Please route to appropriate Pharmacy if that is incorrect. Thanks

## 2022-10-31 ENCOUNTER — Other Ambulatory Visit: Payer: Self-pay | Admitting: Pharmacist

## 2022-10-31 MED ORDER — BACLOFEN 10 MG PO TABS: 20.0000 mg | ORAL_TABLET | Freq: Three times a day (TID) | ORAL | 2 refills | Status: DC

## 2022-11-10 ENCOUNTER — Encounter: Payer: Medicare HMO | Attending: Physical Medicine & Rehabilitation | Admitting: Physical Medicine & Rehabilitation

## 2022-11-10 ENCOUNTER — Encounter: Payer: Self-pay | Admitting: Physical Medicine & Rehabilitation

## 2022-11-10 VITALS — BP 138/70 | HR 94 | Ht 67.0 in | Wt 153.0 lb

## 2022-11-10 DIAGNOSIS — G8111 Spastic hemiplegia affecting right dominant side: Secondary | ICD-10-CM | POA: Insufficient documentation

## 2022-11-10 NOTE — Progress Notes (Signed)
Subjective:    Patient ID: Casey Petty, male    DOB: 01/12/1961, 62 y.o.   MRN: [id number]  HPI Patient returns today 6 weeks after Dysport injection right upper extremity right lower extremity 4 right spastic hemiparesis following CVA.  In the interval time he is been seen by physical therapy on several occasions and feels like he is walking better.  His therapy is on hold, holding his remaining approved visits for the year until later in the year if need be. No falls or new issues. His hand still feels tight.  We discussed the use of the hand splint at night.  His wife indicates that he has a hand splint however the patient does not wear it.  We discussed the importance of this.  RIGHT       Biceps:            100                                     Brachioradialis 200                                     FCR                 200                                     PT                   200                                     FDS                 200                                     FDP                 100                                       Right    Soleus 100  Hamstrings 400U Pain Inventory Average Pain 0 Pain Right Now 0 My pain is  n/a  LOCATION OF PAIN  n/a  BOWEL Number of stools per week: 2 Oral laxative use No  Type of laxative n/a Enema or suppository use No  History of colostomy No  Incontinent No   BLADDER Normal In and out cath, frequency n/a Able to self cath  n/a Bladder incontinence No  Frequent urination No  Leakage with coughing No  Difficulty starting stream No  Incomplete bladder emptying No    Mobility ability to climb steps?  no do you drive?  no Do you have any goals in this area?  no  Function disabled: date disabled   Do you have any goals in this area?  no  Neuro/Psych No problems in this area  Prior Studies N/a  Physicians involved  in your care Follow up   Family History  Problem Relation Age of Onset   Heart attack  Father    Cancer Neg Hx    Colon polyps Neg Hx    Colon cancer Neg Hx    Esophageal cancer Neg Hx    Stomach cancer Neg Hx    Rectal cancer Neg Hx    Social History   Socioeconomic History   Marital status: Single    Spouse name: Not on file   Number of children: Not on file   Years of education: Not on file   Highest education level: Not on file  Occupational History   Not on file  Tobacco Use   Smoking status: Every Day    Packs/day: .25    Types: Cigarettes   Smokeless tobacco: Never   Tobacco comments:    Smoking 4 cigs/day  Vaping Use   Vaping Use: Never used  Substance and Sexual Activity   Alcohol use: Not Currently   Drug use: No   Sexual activity: Never  Other Topics Concern   Not on file  Social History Narrative   Not on file   Social Determinants of Health   Financial Resource Strain: Low Risk  (01/27/2022)   Overall Financial Resource Strain (CARDIA)    Difficulty of Paying Living Expenses: Not very hard  Food Insecurity: No Food Insecurity (01/27/2022)   Hunger Vital Sign    Worried About Running Out of Food in the Last Year: Never true    Ran Out of Food in the Last Year: Never true  Transportation Needs: No Transportation Needs (01/27/2022)   PRAPARE - Hydrologist (Medical): No    Lack of Transportation (Non-Medical): No  Physical Activity: Sufficiently Active (01/27/2022)   Exercise Vital Sign    Days of Exercise per Week: 3 days    Minutes of Exercise per Session: 60 min  Stress: No Stress Concern Present (01/27/2022)   [place]    Feeling of Stress : Not at all  Social Connections: Moderately Isolated (05/31/2021)   Social Connection and Isolation Panel [NHANES]    Frequency of Communication with Friends and Family: More than three times a week    Frequency of Social Gatherings with Friends and Family: More than three times a week    Attends  Religious Services: Never    Marine scientist or Organizations: No    Attends Music therapist: Never    Marital Status: Living with partner   Past Surgical History:  Procedure Laterality Date   WISDOM TOOTH EXTRACTION Bilateral 06/2021   Past Medical History:  Diagnosis Date   Diabetes mellitus without complication ([place])    on meds   Hyperlipidemia    on meds   Hypertension    on meds   Stroke ([place]) 2020   R hemiparesis-abnormal gait   There were no vitals taken for this visit.  Opioid Risk Score:   Fall Risk Score:  `1  Depression screen [facility] 2/9     09/29/2022    9:09 AM 06/28/2022    9:17 AM 05/17/2022    9:24 AM 03/25/2022    9:06 AM 02/04/2022   10:35 AM 01/19/2022    9:27 AM 12/23/2021    9:21 AM  Depression screen PHQ 2/9  Decreased Interest 0 0 0 0 0 1 0  Down, Depressed, Hopeless 0 0 0 0 0 0 0  PHQ - 2 Score 0 0 0 0 0 1 0  Altered sleeping      0   Tired, decreased energy      0   Change in appetite      0   Feeling bad or failure about yourself       0   Trouble concentrating      0   Moving slowly or fidgety/restless      0   Suicidal thoughts      0   PHQ-9 Score      1       Review of Systems  Gastrointestinal:  Positive for nausea and vomiting.  All other systems reviewed and are negative.     Objective:   Physical Exam  Tone MAS 3 at the wrist flexors MAS 2 at the finger flexors MAS 2 at the elbow flexor MAS 1 at the knee flexor Ambulates without assistive device except for AFO no evidence of toe drag or knee instability Speech without evidence of aphasia. Mood and affect are appropriate.      Assessment & Plan:   1.  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  Improvement in tone following Dysport injection.  Would continue with current dosing The right wrist flexor is still tight and I have encouraged both patient and his wife to start using the  resting hand splint at night to help with stretching. Follow-up in 6 weeks for repeat Dysport injection 1500 units same muscle group selection

## 2022-11-17 ENCOUNTER — Ambulatory Visit (INDEPENDENT_AMBULATORY_CARE_PROVIDER_SITE_OTHER): Payer: Medicare HMO | Admitting: Podiatry

## 2022-11-17 DIAGNOSIS — L84 Corns and callosities: Secondary | ICD-10-CM | POA: Diagnosis not present

## 2022-11-17 DIAGNOSIS — M79674 Pain in right toe(s): Secondary | ICD-10-CM

## 2022-11-17 DIAGNOSIS — B351 Tinea unguium: Secondary | ICD-10-CM | POA: Diagnosis not present

## 2022-11-17 DIAGNOSIS — E1165 Type 2 diabetes mellitus with hyperglycemia: Secondary | ICD-10-CM

## 2022-11-17 DIAGNOSIS — M79675 Pain in left toe(s): Secondary | ICD-10-CM

## 2022-11-17 NOTE — Progress Notes (Signed)
  Subjective:  Patient ID: Casey Petty, male    DOB: 10-22-1960,  MRN: [id number]  Chief Complaint  Patient presents with   diabetic foot care     62 y.o. male presents due to concern for callus on the left foot.  Patient does have a history of type 2 diabetes with hyperglycemia.  Patient states he has minimal pain with a callus however he does have some sensory loss in his foot due to diabetes. Pt also has difficulty with trimming nails due to thickness and abnormal growth.   Past Medical History:  Diagnosis Date   Diabetes mellitus without complication    on meds   Hyperlipidemia    on meds   Hypertension    on meds   Stroke 2020   R hemiparesis-abnormal gait    Allergies  Allergen Reactions   Lisinopril Swelling    Angioedema- lips/face    ROS: Negative except as per HPI above  Objective:  General: AAO x3, NAD  Dermatological: Plantar aspect of the left heel there is noted to be a preulcerative hyperkeratotic lesion present diffuse and very thick over the left plantar heel.  Mild pain with palpation of the area there is thick dry skin at this area on debridement there is no underlying ulceration.  Nails thickened elongated dystrophic x 5 bilateral foot with black and gray discoloration  Vascular:  Dorsalis Pedis artery and Posterior Tibial artery pedal pulses are 2/4 bilateral.  Capillary fill time < 3 sec to all digits.   Neruologic: Grossly diminished via light touch bilateral protective sensation diminished  Musculoskeletal: No gross boney pedal deformities bilateral. No pain, crepitus, or limitation noted with foot and ankle range of motion bilateral. Muscular strength 5/5 in all groups tested bilateral.  Gait: Unassisted, Nonantalgic.   No images are attached to the encounter.   Assessment:   1. Pre-ulcerative calluses   2. Pain due to onychomycosis of toenails of both feet   3. Uncontrolled type 2 diabetes mellitus with hyperglycemia       Plan:   Patient was evaluated and treated and all questions answered.  # Preulcerative callus plantar aspect left heel All symptomatic hyperkeratoses were safely debrided with a sterile #15 blade to patient's level of comfort without incident. We discussed preventative and palliative care of these lesions including supportive and accommodative shoegear, padding, prefabricated and custom molded accommodative orthoses, use of a pumice stone and lotions/creams daily.   #Onychomycosis with pain  -Nails palliatively debrided as below. -Educated on self-care  Procedure: Nail Debridement Rationale: Pain Type of Debridement: manual, sharp debridement. Instrumentation: Nail nipper, rotary burr. Number of Nails: 10  Return in about 3 months (around 02/16/2023) for [facility]          Corinna Gab, DPM [facility] / [facility]

## 2022-12-29 ENCOUNTER — Encounter: Payer: Medicare HMO | Attending: Physical Medicine & Rehabilitation | Admitting: Physical Medicine & Rehabilitation

## 2022-12-29 ENCOUNTER — Encounter: Payer: Self-pay | Admitting: Physical Medicine & Rehabilitation

## 2022-12-29 VITALS — BP 130/75 | HR 84 | Ht 67.0 in | Wt 147.0 lb

## 2022-12-29 DIAGNOSIS — G8111 Spastic hemiplegia affecting right dominant side: Secondary | ICD-10-CM

## 2022-12-29 MED ORDER — ABOBOTULINUMTOXINA 500 UNITS IM SOLR
500.0000 [IU] | Freq: Once | INTRAMUSCULAR | Status: AC
Start: 2022-12-29 — End: 2022-12-29
  Administered 2022-12-29: 1500 [IU] via INTRAMUSCULAR

## 2022-12-29 MED ORDER — SODIUM CHLORIDE (PF) 0.9 % IJ SOLN
7.5000 mL | Freq: Once | INTRAMUSCULAR | Status: AC
Start: 2022-12-29 — End: 2022-12-29
  Administered 2022-12-29: 7.5 mL via INTRAVENOUS

## 2022-12-29 NOTE — Patient Instructions (Signed)

## 2022-12-29 NOTE — Progress Notes (Signed)
Dysport Injection for spasticity using needle EMG guidance  Pt has been responding well to botulinum toxin injections for stroke related RIght spastic hemiparesis  Dilution: 200 Units/ml Indication: Severe spasticity which interferes with ADL,mobility and/or  hygiene and is unresponsive to medication management and other conservative care Informed consent was obtained after describing risks and benefits of the procedure with the patient. This includes bleeding, bruising, infection, excessive weakness, or medication side effects. A REMS form is on file and signed. Needle:  needle electrode Number of units per muscle RIGHT       Biceps:            100 Brachialis 100U                                      Brachioradialis 100                                     FCR                 200                                     PT                   200                                     FDS                 200                                     FDP                 100                                       Right    Soleus 100  Hamstrings 400U All injections were done after obtaining appropriate EMG activity and after negative drawback for blood. The patient tolerated the procedure well. Post procedure instructions were given. A followup appointment was made 6 wk.

## 2023-01-03 NOTE — Patient Instructions (Incomplete)
[name] , Thank you for taking time to come for your Medicare Wellness Visit. I appreciate your ongoing commitment to your health goals. Please review the following plan we discussed and let me know if I can assist you in the future.   These are the goals we discussed:  Goals   None     This is a list of the screening recommended for you and due dates:  Health Maintenance  Topic Date Due   COVID-19 Vaccine (1) Never done   DTaP/Tdap/Td vaccine (1 - Tdap) Never done   Zoster (Shingles) Vaccine (1 of 2) Never done   Eye exam for diabetics  11/18/2021   Complete foot exam   06/14/2022   Medicare Annual Wellness Visit  01/28/2023   Hemoglobin A1C  01/20/2023   Flu Shot  03/09/2023   Yearly kidney health urinalysis for diabetes  07/22/2023   Yearly kidney function blood test for diabetes  08/27/2023   Colon Cancer Screening  12/27/2028   Hepatitis C Screening  Completed   HIV Screening  Completed   HPV Vaccine  Aged Out    Advanced directives: Information on Advanced Care Planning can be found at [facility] Advance Health Care Directives Advance Health Care Directives ([email or web address]  Please bring a copy of your health care power of attorney and living will to the office to be added to your chart at your convenience.   Conditions/risks identified: Aim for 30 minutes of exercise or brisk walking, 6-8 glasses of water, and 5 servings of fruits and vegetables each day.   Next appointment: Follow up in one year for your annual wellness visit   Preventive Care 40-64 Years, Male Preventive care refers to lifestyle choices and visits with your health care provider that can promote health and wellness. What does preventive care include? A yearly physical exam. This is also called an annual well check. Dental exams once or twice a year. Routine eye exams. Ask your health care provider how often you should have your eyes checked. Personal lifestyle choices,  including: Daily care of your teeth and gums. Regular physical activity. Eating a healthy diet. Avoiding tobacco and drug use. Limiting alcohol use. Practicing safe sex. Taking low-dose aspirin every day starting at age 54. What happens during an annual well check? The services and screenings done by your health care provider during your annual well check will depend on your age, overall health, lifestyle risk factors, and family history of disease. Counseling  Your health care provider may ask you questions about your: Alcohol use. Tobacco use. Drug use. Emotional well-being. Home and relationship well-being. Sexual activity. Eating habits. Work and work Astronomer. Screening  You may have the following tests or measurements: Height, weight, and BMI. Blood pressure. Lipid and cholesterol levels. These may be checked every 5 years, or more frequently if you are over 21 years old. Skin check. Lung cancer screening. You may have this screening every year starting at age 31 if you have a 30-pack-year history of smoking and currently smoke or have quit within the past 15 years. Fecal occult blood test (FOBT) of the stool. You may have this test every year starting at age 73. Flexible sigmoidoscopy or colonoscopy. You may have a sigmoidoscopy every 5 years or a colonoscopy every 10 years starting at age 66. Prostate cancer screening. Recommendations will vary depending on your family history and other risks. Hepatitis C blood test. Hepatitis B blood test. Sexually transmitted disease (STD) testing. Diabetes  screening. This is done by checking your blood sugar (glucose) after you have not eaten for a while (fasting). You may have this done every 1-3 years. Discuss your test results, treatment options, and if necessary, the need for more tests with your health care provider. Vaccines  Your health care provider may recommend certain vaccines, such as: Influenza vaccine. This is  recommended every year. Tetanus, diphtheria, and acellular pertussis (Tdap, Td) vaccine. You may need a Td booster every 10 years. Zoster vaccine. You may need this after age 29. Pneumococcal 13-valent conjugate (PCV13) vaccine. You may need this if you have certain conditions and have not been vaccinated. Pneumococcal polysaccharide (PPSV23) vaccine. You may need one or two doses if you smoke cigarettes or if you have certain conditions. Talk to your health care provider about which screenings and vaccines you need and how often you need them. This information is not intended to replace advice given to you by your health care provider. Make sure you discuss any questions you have with your health care provider. Document Released: 08/21/2015 Document Revised: 04/13/2016 Document Reviewed: 05/26/2015 Elsevier Interactive Patient Education  2017 [employer].  Fall Prevention in the Home Falls can cause injuries. They can happen to people of all ages. There are many things you can do to make your home safe and to help prevent falls. What can I do on the outside of my home? Regularly fix the edges of walkways and driveways and fix any cracks. Remove anything that might make you trip as you walk through a door, such as a raised step or threshold. Trim any bushes or trees on the path to your home. Use bright outdoor lighting. Clear any walking paths of anything that might make someone trip, such as rocks or tools. Regularly check to see if handrails are loose or broken. Make sure that both sides of any steps have handrails. Any raised decks and porches should have guardrails on the edges. Have any leaves, snow, or ice cleared regularly. Use sand or salt on walking paths during winter. Clean up any spills in your garage right away. This includes oil or grease spills. What can I do in the bathroom? Use night lights. Install grab bars by the toilet and in the tub and shower. Do not use towel bars  as grab bars. Use non-skid mats or decals in the tub or shower. If you need to sit down in the shower, use a plastic, non-slip stool. Keep the floor dry. Clean up any water that spills on the floor as soon as it happens. Remove soap buildup in the tub or shower regularly. Attach bath mats securely with double-sided non-slip rug tape. Do not have throw rugs and other things on the floor that can make you trip. What can I do in the bedroom? Use night lights. Make sure that you have a light by your bed that is easy to reach. Do not use any sheets or blankets that are too big for your bed. They should not hang down onto the floor. Have a firm chair that has side arms. You can use this for support while you get dressed. Do not have throw rugs and other things on the floor that can make you trip. What can I do in the kitchen? Clean up any spills right away. Avoid walking on wet floors. Keep items that you use a lot in easy-to-reach places. If you need to reach something above you, use a strong step stool that has a  grab bar. Keep electrical cords out of the way. Do not use floor polish or wax that makes floors slippery. If you must use wax, use non-skid floor wax. Do not have throw rugs and other things on the floor that can make you trip. What can I do with my stairs? Do not leave any items on the stairs. Make sure that there are handrails on both sides of the stairs and use them. Fix handrails that are broken or loose. Make sure that handrails are as long as the stairways. Check any carpeting to make sure that it is firmly attached to the stairs. Fix any carpet that is loose or worn. Avoid having throw rugs at the top or bottom of the stairs. If you do have throw rugs, attach them to the floor with carpet tape. Make sure that you have a light switch at the top of the stairs and the bottom of the stairs. If you do not have them, ask someone to add them for you. What else can I do to help  prevent falls? Wear shoes that: Do not have high heels. Have rubber bottoms. Are comfortable and fit you well. Are closed at the toe. Do not wear sandals. If you use a stepladder: Make sure that it is fully opened. Do not climb a closed stepladder. Make sure that both sides of the stepladder are locked into place. Ask someone to hold it for you, if possible. Clearly mark and make sure that you can see: Any grab bars or handrails. First and last steps. Where the edge of each step is. Use tools that help you move around (mobility aids) if they are needed. These include: Canes. Walkers. Scooters. Crutches. Turn on the lights when you go into a dark area. Replace any light bulbs as soon as they burn out. Set up your furniture so you have a clear path. Avoid moving your furniture around. If any of your floors are uneven, fix them. If there are any pets around you, be aware of where they are. Review your medicines with your doctor. Some medicines can make you feel dizzy. This can increase your chance of falling. Ask your doctor what other things that you can do to help prevent falls. This information is not intended to replace advice given to you by your health care provider. Make sure you discuss any questions you have with your health care provider. Document Released: 05/21/2009 Document Revised: 12/31/2015 Document Reviewed: 08/29/2014 Elsevier Interactive Patient Education  2017 [employer].

## 2023-01-03 NOTE — Progress Notes (Unsigned)
Subjective:   Casey Petty is a 62 y.o. male who presents for Medicare Annual/Subsequent preventive examination.  Review of Systems    ***       Objective:    There were no vitals filed for this visit. There is no height or weight on file to calculate BMI.     02/18/2022    8:50 AM 01/27/2022    9:09 AM 11/09/2021    9:47 AM 08/19/2020   11:06 AM 11/26/2019    9:43 AM 11/26/2019    8:28 AM 03/04/2019    1:23 PM  Advanced Directives  Does Patient Have a Medical Advance Directive? No No No No No No No  Would patient like information on creating a medical advance directive? No - Patient declined   No - Patient declined No - Guardian declined No - Guardian declined No - Patient declined    Current Medications (verified) Outpatient Encounter Medications as of 01/04/2023  Medication Sig   amLODipine (NORVASC) 10 MG tablet TAKE ONE TABLET BY MOUTH EVERY MORNING FOR BLOOD PRESSURE   aspirin EC 81 MG tablet Take 1 tablet (81 mg total) by mouth daily.   baclofen (LIORESAL) 10 MG tablet Take 2 tablets (20 mg total) by mouth 3 (three) times daily.   Blood Glucose Monitoring Suppl (ACCU-CHEK GUIDE) w/Device KIT 1 each by Does not apply route in the morning, at noon, in the evening, and at bedtime.   Blood Glucose Monitoring Suppl (TRUE METRIX METER) w/Device KIT Check blood sugar fasting and before meals and again if pt feels bad (symptoms of hypo).   buPROPion (WELLBUTRIN XL) 150 MG 24 hr tablet Take 1 tablet (150 mg total) by mouth daily.   carvedilol (COREG) 12.5 MG tablet TAKE ONE TABLET BY MOUTH EVERY MORNING and TAKE ONE TABLET BY MOUTH EVERY EVENING   COMFORT EZ PEN NEEDLES 32G X 4 MM MISC USE TO INJECT insulin   Continuous Blood Gluc Receiver (DEXCOM G6 RECEIVER) DEVI Use to check blood sugar TID.   Continuous Blood Gluc Sensor (DEXCOM G6 SENSOR) MISC Use to check blood sugar TID.   Continuous Blood Gluc Transmit (DEXCOM G6 TRANSMITTER) MISC Use to check blood sugar TID.    empagliflozin (JARDIANCE) 25 MG TABS tablet TAKE ONE TABLET BY MOUTH BEFORE BREAKFAST   ezetimibe (ZETIA) 10 MG tablet Take 1 tablet (10 mg total) by mouth every evening.   glucose blood (ACCU-CHEK GUIDE) test strip Use as instructed up to 4 times daily   glucose blood (TRUE METRIX BLOOD GLUCOSE TEST) test strip Use as instructed before every meal nightly up to 4 times daily   hydrALAZINE (APRESOLINE) 100 MG tablet Take 1 tablet (100 mg total) by mouth 3 (three) times daily.   insulin glargine (LANTUS) 100 UNIT/ML Solostar Pen INJECT 10 UNITS INTO THE SKIN DAILY.   Misc. Devices MISC Wheelchair with Accessories: elevating leg rests (ELRs), wheel locks, extensions and anti-tippers. Cane or walker will not suffice   Misc. Devices MISC Rollator walker with seat.  Diagnosis - Stroke   rosuvastatin (CRESTOR) 40 MG tablet Take 1 tablet (40 mg total) by mouth daily.   TRUEplus Lancets 28G MISC Check blood sugar before every meal, nightly up to 4 times a day.   UREA 10 HYDRATING 10 % cream APPLY TO AFFECTED AREA EVERY DAY AS NEEDED   No facility-administered encounter medications on file as of 01/04/2023.    Allergies (verified) Lisinopril   History: Past Medical History:  Diagnosis Date   Diabetes  mellitus without complication (HCC)    on meds   Hyperlipidemia    on meds   Hypertension    on meds   Stroke [facility]) 2020   R hemiparesis-abnormal gait   Past Surgical History:  Procedure Laterality Date   WISDOM TOOTH EXTRACTION Bilateral 06/2021   Family History  Problem Relation Age of Onset   Heart attack Father    Cancer Neg Hx    Colon polyps Neg Hx    Colon cancer Neg Hx    Esophageal cancer Neg Hx    Stomach cancer Neg Hx    Rectal cancer Neg Hx    Social History   Socioeconomic History   Marital status: Single    Spouse name: Not on file   Number of children: Not on file   Years of education: Not on file   Highest education level: Not on file  Occupational History   Not  on file  Tobacco Use   Smoking status: Every Day    Packs/day: .25    Types: Cigarettes   Smokeless tobacco: Never   Tobacco comments:    Smoking 4 cigs/day  Vaping Use   Vaping Use: Never used  Substance and Sexual Activity   Alcohol use: Not Currently   Drug use: No   Sexual activity: Never  Other Topics Concern   Not on file  Social History Narrative   Not on file   Social Determinants of Health   Financial Resource Strain: Low Risk  (01/27/2022)   Overall Financial Resource Strain (CARDIA)    Difficulty of Paying Living Expenses: Not very hard  Food Insecurity: No Food Insecurity (01/27/2022)   Hunger Vital Sign    Worried About Running Out of Food in the Last Year: Never true    Ran Out of Food in the Last Year: Never true  Transportation Needs: No Transportation Needs (01/27/2022)   PRAPARE - Transportation    Lack of Transportation (Medical): No    Lack of Transportation (Non-Medical): No  Physical Activity: Sufficiently Active (01/27/2022)   Exercise Vital Sign    Days of Exercise per Week: 3 days    Minutes of Exercise per Session: 60 min  Stress: No Stress Concern Present (01/27/2022)   [employer] of Occupational Health - Occupational Stress Questionnaire    Feeling of Stress : Not at all  Social Connections: Moderately Isolated (05/31/2021)   Social Connection and Isolation Panel [NHANES]    Frequency of Communication with Friends and Family: More than three times a week    Frequency of Social Gatherings with Friends and Family: More than three times a week    Attends Religious Services: Never    Database administrator or Organizations: No    Attends Engineer, structural: Never    Marital Status: Living with partner    Tobacco Counseling Ready to quit: Not Answered Counseling given: Not Answered Tobacco comments: Smoking 4 cigs/day   Clinical Intake:                 Diabetic?Yes   Nutrition Risk Assessment:  Has the patient  had any N/V/D within the last 2 months?  {YES/NO:21197} Does the patient have any non-healing wounds?  {YES/NO:21197} Has the patient had any unintentional weight loss or weight gain?  {YES/NO:21197}  Diabetes:  Is the patient diabetic?  {YES/NO:21197} If diabetic, was a CBG obtained today?  {YES/NO:21197} Did the patient bring in their glucometer from home?  {YES/NO:21197} How often do you  monitor your CBG's? ***.   Financial Strains and Diabetes Management:  Are you having any financial strains with the device, your supplies or your medication? {YES/NO:21197}.  Does the patient want to be seen by Chronic Care Management for management of their diabetes?  {YES/NO:21197} Would the patient like to be referred to a Nutritionist or for Diabetic Management?  {YES/NO:21197}  Diabetic Exams:  {Diabetic Eye Exam:2101801} {Diabetic Foot Exam:2101802}          Activities of Daily Living    01/27/2022    9:08 AM  In your present state of health, do you have any difficulty performing the following activities:  Hearing? 0  Vision? 0  Difficulty concentrating or making decisions? 0  Walking or climbing stairs? 0  Dressing or bathing? 0  Doing errands, shopping? 0  Preparing Food and eating ? N  Using the Toilet? N  In the past six months, have you accidently leaked urine? N  Do you have problems with loss of bowel control? N  Managing your Medications? N  Managing your Finances? N  Housekeeping or managing your Housekeeping? N    Patient Care Team: Hoy Register, MD as PCP - General (Family Medicine)  Indicate any recent Medical Services you may have received from other than [facility] providers in the past year (date may be approximate).     Assessment:   This is a routine wellness examination for Casey Petty.  Hearing/Vision screen No results found.  Dietary issues and exercise activities discussed:     Goals Addressed   None    Depression Screen    12/29/2022    9:11  AM 11/10/2022    9:25 AM 09/29/2022    9:09 AM 06/28/2022    9:17 AM 05/17/2022    9:24 AM 03/25/2022    9:06 AM 02/04/2022   10:35 AM  PHQ 2/9 Scores  PHQ - 2 Score 0 0 0 0 0 0 0    Fall Risk    12/29/2022    9:10 AM 11/10/2022    9:25 AM 09/29/2022    9:09 AM 07/21/2022    8:56 AM 06/28/2022    9:17 AM  Fall Risk   Falls in the past year? 0 0 0 0 0  Number falls in past yr: 0 0  0   Injury with Fall? 0 0  0     FALL RISK PREVENTION PERTAINING TO THE HOME:  Any stairs in or around the home? {YES/NO:21197} If so, are there any without handrails? {YES/NO:21197} Home free of loose throw rugs in walkways, pet beds, electrical cords, etc? {YES/NO:21197} Adequate lighting in your home to reduce risk of falls? {YES/NO:21197}  ASSISTIVE DEVICES UTILIZED TO PREVENT FALLS:  Life alert? {YES/NO:21197} Use of a cane, walker or w/c? {YES/NO:21197} Grab bars in the bathroom? {YES/NO:21197} Shower chair or bench in shower? {YES/NO:21197} Elevated toilet seat or a handicapped toilet? {YES/NO:21197}  TIMED UP AND GO:  Was the test performed? No . Telephonic visit   Cognitive Function:        Immunizations Immunization History  Administered Date(s) Administered   Influenza,inj,Quad PF,6+ Mos 06/27/2014, 05/08/2019, 06/09/2021, 07/21/2022   Pneumococcal Polysaccharide-23 05/08/2019    TDAP status: Due, Education has been provided regarding the importance of this vaccine. Advised may receive this vaccine at local pharmacy or Health Dept. Aware to provide a copy of the vaccination record if obtained from local pharmacy or Health Dept. Verbalized acceptance and understanding.  Pneumococcal vaccine status: Up to date  Covid-19  vaccine status: Information provided on how to obtain vaccines.   Qualifies for Shingles Vaccine? Yes   Zostavax completed No   Shingrix Completed?: No.    Education has been provided regarding the importance of this vaccine. Patient has been advised to call  insurance company to determine out of pocket expense if they have not yet received this vaccine. Advised may also receive vaccine at local pharmacy or Health Dept. Verbalized acceptance and understanding.  Screening Tests Health Maintenance  Topic Date Due   COVID-19 Vaccine (1) Never done   DTaP/Tdap/Td (1 - Tdap) Never done   Zoster Vaccines- Shingrix (1 of 2) Never done   OPHTHALMOLOGY EXAM  11/18/2021   FOOT EXAM  06/14/2022   Medicare Annual Wellness (AWV)  01/28/2023   HEMOGLOBIN A1C  01/20/2023   INFLUENZA VACCINE  03/09/2023   Diabetic kidney evaluation - Urine ACR  07/22/2023   Diabetic kidney evaluation - eGFR measurement  08/27/2023   Colonoscopy  12/27/2028   Hepatitis C Screening  Completed   HIV Screening  Completed   HPV VACCINES  Aged Out    Health Maintenance  Health Maintenance Due  Topic Date Due   COVID-19 Vaccine (1) Never done   DTaP/Tdap/Td (1 - Tdap) Never done   Zoster Vaccines- Shingrix (1 of 2) Never done   OPHTHALMOLOGY EXAM  11/18/2021   FOOT EXAM  06/14/2022   Medicare Annual Wellness (AWV)  01/28/2023    Colorectal cancer screening: Type of screening: Colonoscopy. Completed 12/27/21. Repeat every 7 years  Lung Cancer Screening: (Low Dose CT Chest recommended if Age 26-80 years, 30 pack-year currently smoking OR have quit w/in 15years.) does not qualify.   Lung Cancer Screening Referral: n/a  Additional Screening:  Hepatitis C Screening: does qualify; Completed 09/01/20  Vision Screening: Recommended annual ophthalmology exams for early detection of glaucoma and other disorders of the eye. Is the patient up to date with their annual eye exam?  {YES/NO:21197} Who is the provider or what is the name of the office in which the patient attends annual eye exams? *** If pt is not established with a provider, would they like to be referred to a provider to establish care? {YES/NO:21197}.   Dental Screening: Recommended annual dental exams for  proper oral hygiene  Community Resource Referral / Chronic Care Management: CRR required this visit?  {YES/NO:21197}  CCM required this visit?  {YES/NO:21197}     Plan:     I have personally reviewed and noted the following in the patient's chart:   Medical and social history Use of alcohol, tobacco or illicit drugs  Current medications and supplements including opioid prescriptions. {Opioid [phone]} Functional ability and status Nutritional status Physical activity Advanced directives List of other physicians Hospitalizations, surgeries, and ER visits in previous 12 months Vitals Screenings to include cognitive, depression, and falls Referrals and appointments  In addition, I have reviewed and discussed with patient certain preventive protocols, quality metrics, and best practice recommendations. A written personalized care plan for preventive services as well as general preventive health recommendations were provided to patient.     Durwin Nora, [place]   1/61/0960   Due to this being a virtual visit, the after visit summary with patients personalized plan was offered to patient via mail or my-chart. ***Patient declined at this time./ Patient would like to access on my-chart/ per request, patient was mailed a copy of AVS./ Patient preferred to pick up at office at next visit  Nurse Notes: ***

## 2023-01-04 ENCOUNTER — Ambulatory Visit: Payer: Medicare HMO | Attending: Family Medicine

## 2023-01-04 VITALS — Ht 67.0 in | Wt 147.0 lb

## 2023-01-04 DIAGNOSIS — Z0000 Encounter for general adult medical examination without abnormal findings: Secondary | ICD-10-CM

## 2023-01-06 ENCOUNTER — Other Ambulatory Visit: Payer: Self-pay | Admitting: Family Medicine

## 2023-01-06 DIAGNOSIS — Z794 Long term (current) use of insulin: Secondary | ICD-10-CM

## 2023-01-06 NOTE — Telephone Encounter (Signed)
Requested Prescriptions  Pending Prescriptions Disp Refills   LANTUS SOLOSTAR 100 UNIT/ML Solostar Pen [Pharmacy Med Name: LANTUS SOLOSTAR 100 UNIT/ML Solution Pen-injector] 15 mL 0    Sig: INJECT 10 UNITS UNDER THE SKIN DAILY. (DISCARD PEN 28 DAYS AFTER OPENING)     Endocrinology:  Diabetes - Insulins Passed - 01/06/2023  2:06 PM      Passed - HBA1C is between 0 and 7.9 and within 180 days    HbA1c, POC (controlled diabetic range)  Date Value Ref Range Status  07/21/2022 6.7 0.0 - 7.0 % Final         Passed - Valid encounter within last 6 months    Recent Outpatient Visits           5 months ago Type 2 diabetes mellitus with other specified complication, with long-term current use of insulin (HCC)   [place] [facility] [place], [place], [place]   11 months ago Type 2 diabetes mellitus with other specified complication, with long-term current use of insulin (HCC)   [place] [facility] [place], [place], MD   1 year ago Type 2 diabetes mellitus with other specified complication, with long-term current use of insulin (HCC)   [place] [facility] [place], [place], MD   1 year ago Type 2 diabetes mellitus with other specified complication, with long-term current use of insulin (HCC)   [place] [facility] Hoy Register, MD   1 year ago Spastic hemiparesis [facility])   [place] [facility] Hoy Register, MD       Future Appointments             In 1 week Hoy Register, MD [facility]

## 2023-01-18 ENCOUNTER — Encounter: Payer: Self-pay | Admitting: Family Medicine

## 2023-01-18 ENCOUNTER — Ambulatory Visit: Payer: Medicare HMO | Attending: Family Medicine | Admitting: Family Medicine

## 2023-01-18 VITALS — BP 129/69 | HR 90 | Temp 98.0°F | Ht 67.0 in | Wt 147.0 lb

## 2023-01-18 DIAGNOSIS — I6930 Unspecified sequelae of cerebral infarction: Secondary | ICD-10-CM | POA: Diagnosis not present

## 2023-01-18 DIAGNOSIS — Z794 Long term (current) use of insulin: Secondary | ICD-10-CM

## 2023-01-18 DIAGNOSIS — I152 Hypertension secondary to endocrine disorders: Secondary | ICD-10-CM | POA: Diagnosis not present

## 2023-01-18 DIAGNOSIS — Z634 Disappearance and death of family member: Secondary | ICD-10-CM

## 2023-01-18 DIAGNOSIS — E1159 Type 2 diabetes mellitus with other circulatory complications: Secondary | ICD-10-CM | POA: Diagnosis not present

## 2023-01-18 DIAGNOSIS — E1169 Type 2 diabetes mellitus with other specified complication: Secondary | ICD-10-CM | POA: Diagnosis not present

## 2023-01-18 DIAGNOSIS — I69351 Hemiplegia and hemiparesis following cerebral infarction affecting right dominant side: Secondary | ICD-10-CM | POA: Diagnosis not present

## 2023-01-18 DIAGNOSIS — Z720 Tobacco use: Secondary | ICD-10-CM

## 2023-01-18 LAB — POCT GLYCOSYLATED HEMOGLOBIN (HGB A1C): HbA1c, POC (controlled diabetic range): 6.6 % (ref 0.0–7.0)

## 2023-01-18 MED ORDER — CARVEDILOL 12.5 MG PO TABS
ORAL_TABLET | ORAL | 1 refills | Status: DC
Start: 2023-01-18 — End: 2023-07-10

## 2023-01-18 MED ORDER — LANTUS SOLOSTAR 100 UNIT/ML ~~LOC~~ SOPN
PEN_INJECTOR | SUBCUTANEOUS | 2 refills | Status: DC
Start: 2023-01-18 — End: 2023-08-10

## 2023-01-18 MED ORDER — ROSUVASTATIN CALCIUM 40 MG PO TABS: 40.0000 mg | ORAL_TABLET | Freq: Every day | ORAL | 1 refills | Status: DC

## 2023-01-18 MED ORDER — EMPAGLIFLOZIN 25 MG PO TABS: ORAL_TABLET | ORAL | 1 refills | Status: DC

## 2023-01-18 MED ORDER — HYDRALAZINE HCL 100 MG PO TABS: 100.0000 mg | ORAL_TABLET | Freq: Three times a day (TID) | ORAL | 1 refills | Status: DC

## 2023-01-18 MED ORDER — BUPROPION HCL ER (XL) 150 MG PO TB24: 150.0000 mg | ORAL_TABLET | Freq: Every day | ORAL | 1 refills | Status: DC

## 2023-01-18 MED ORDER — AMLODIPINE BESYLATE 10 MG PO TABS: ORAL_TABLET | ORAL | 1 refills | Status: DC

## 2023-01-18 NOTE — Patient Instructions (Signed)

## 2023-01-18 NOTE — Progress Notes (Signed)
Subjective:  Patient ID: Casey Petty, male    DOB: [birth year]  Age: 62 y.o. MRN: [id number]  CC: Diabetes   HPI Casey Petty is a 62 y.o. year old male with a history of  type 2 diabetes mellitus (A1c 6.6) stroke (with right spastic hemiparesis s/p Botox injections by rehab), right adhesive capsulitis, dysarthria, Hypertension, Nicotine dependence (3 Cigarettes /day, <20 pack years)   Interval History:  He has completed Physical Therapy. Last month he received a Botox injection.  He continues to use his right AFO brace and continues with home physical exercises.  Diabetes is controlled and he has had no hypoglycemia.  He is up-to-date on eye exams.  He endorses adherence with his regimen. Doses adherence with his antihypertensive.  On the PHQ9 he indicated a 3 on the suicide question however on further questioning he stated he did not understand the question and was absent-minded. His Mom passed away 1 month ago.    Past Medical History:  Diagnosis Date   Diabetes mellitus without complication (HCC)    on meds   Hyperlipidemia    on meds   Hypertension    on meds   Stroke [facility]) 2020   R hemiparesis-abnormal gait    Past Surgical History:  Procedure Laterality Date   WISDOM TOOTH EXTRACTION Bilateral 06/2021    Family History  Problem Relation Age of Onset   Heart attack Father    Cancer Neg Hx    Colon polyps Neg Hx    Colon cancer Neg Hx    Esophageal cancer Neg Hx    Stomach cancer Neg Hx    Rectal cancer Neg Hx     Social History   Socioeconomic History   Marital status: Single    Spouse name: Not on file   Number of children: Not on file   Years of education: Not on file   Highest education level: Not on file  Occupational History   Not on file  Tobacco Use   Smoking status: Every Day    Packs/day: .25    Types: Cigarettes   Smokeless tobacco: Never   Tobacco comments:    Smoking 4 cigs/day  Vaping Use   Vaping Use: Never used  Substance  and Sexual Activity   Alcohol use: Not Currently   Drug use: No   Sexual activity: Never  Other Topics Concern   Not on file  Social History Narrative   Not on file   Social Determinants of Health   Financial Resource Strain: Low Risk  (01/27/2022)   Overall Financial Resource Strain (CARDIA)    Difficulty of Paying Living Expenses: Not very hard  Food Insecurity: No Food Insecurity (01/04/2023)   Hunger Vital Sign    Worried About Running Out of Food in the Last Year: Never true    Ran Out of Food in the Last Year: Never true  Transportation Needs: No Transportation Needs (01/04/2023)   PRAPARE - Administrator, Civil Service (Medical): No    Lack of Transportation (Non-Medical): No  Physical Activity: Insufficiently Active (01/04/2023)   Exercise Vital Sign    Days of Exercise per Week: 3 days    Minutes of Exercise per Session: 30 min  Stress: No Stress Concern Present (01/04/2023)   [employer] of Occupational Health - Occupational Stress Questionnaire    Feeling of Stress : Not at all  Social Connections: Moderately Isolated (01/04/2023)   Social Connection and Isolation Panel [NHANES]    Frequency  of Communication with Friends and Family: More than three times a week    Frequency of Social Gatherings with Friends and Family: Three times a week    Attends Religious Services: Never    Active Member of Clubs or Organizations: No    Attends Banker Meetings: Never    Marital Status: Living with partner    Allergies  Allergen Reactions   Lisinopril Swelling    Angioedema- lips/face    Outpatient Medications Prior to Visit  Medication Sig Dispense Refill   aspirin EC 81 MG tablet Take 1 tablet (81 mg total) by mouth daily. 100 tablet 0   baclofen (LIORESAL) 10 MG tablet Take 2 tablets (20 mg total) by mouth 3 (three) times daily. 180 tablet 2   Blood Glucose Monitoring Suppl (ACCU-CHEK GUIDE) w/Device KIT 1 each by Does not apply route in the  morning, at noon, in the evening, and at bedtime. 1 kit 0   Blood Glucose Monitoring Suppl (TRUE METRIX METER) w/Device KIT Check blood sugar fasting and before meals and again if pt feels bad (symptoms of hypo). 1 kit 0   COMFORT EZ PEN NEEDLES 32G X 4 MM MISC USE TO INJECT insulin 100 each 2   Continuous Blood Gluc Receiver (DEXCOM G6 RECEIVER) DEVI Use to check blood sugar TID. 1 each 0   Continuous Blood Gluc Sensor (DEXCOM G6 SENSOR) MISC Use to check blood sugar TID. 3 each 3   Continuous Blood Gluc Transmit (DEXCOM G6 TRANSMITTER) MISC Use to check blood sugar TID. 1 each 3   ezetimibe (ZETIA) 10 MG tablet Take 1 tablet (10 mg total) by mouth every evening. 90 tablet 2   glucose blood (ACCU-CHEK GUIDE) test strip Use as instructed up to 4 times daily 100 each 12   glucose blood (TRUE METRIX BLOOD GLUCOSE TEST) test strip Use as instructed before every meal nightly up to 4 times daily 100 each 12   Misc. Devices MISC Wheelchair with Accessories: elevating leg rests (ELRs), wheel locks, extensions and anti-tippers. Cane or walker will not suffice 1 each 0   Misc. Devices MISC Rollator walker with seat.  Diagnosis - Stroke 1 each 0   TRUEplus Lancets 28G MISC Check blood sugar before every meal, nightly up to 4 times a day. 100 each 12   UREA 10 HYDRATING 10 % cream APPLY TO AFFECTED AREA EVERY DAY AS NEEDED 85 g 0   amLODipine (NORVASC) 10 MG tablet TAKE ONE TABLET BY MOUTH EVERY MORNING FOR BLOOD PRESSURE 90 tablet 1   buPROPion (WELLBUTRIN XL) 150 MG 24 hr tablet Take 1 tablet (150 mg total) by mouth daily. 90 tablet 1   carvedilol (COREG) 12.5 MG tablet TAKE ONE TABLET BY MOUTH EVERY MORNING and TAKE ONE TABLET BY MOUTH EVERY EVENING 180 tablet 1   empagliflozin (JARDIANCE) 25 MG TABS tablet TAKE ONE TABLET BY MOUTH BEFORE BREAKFAST 90 tablet 1   hydrALAZINE (APRESOLINE) 100 MG tablet Take 1 tablet (100 mg total) by mouth 3 (three) times daily. 270 tablet 1   insulin glargine (LANTUS  SOLOSTAR) 100 UNIT/ML Solostar Pen INJECT 10 UNITS UNDER THE SKIN DAILY. (DISCARD PEN 28 DAYS AFTER OPENING) 15 mL 0   rosuvastatin (CRESTOR) 40 MG tablet Take 1 tablet (40 mg total) by mouth daily. 90 tablet 2   No facility-administered medications prior to visit.     ROS Review of Systems  Constitutional:  Negative for activity change and appetite change.  HENT:  Negative for  sinus pressure and sore throat.   Respiratory:  Negative for chest tightness, shortness of breath and wheezing.   Cardiovascular:  Negative for chest pain and palpitations.  Gastrointestinal:  Negative for abdominal distention, abdominal pain and constipation.  Genitourinary: Negative.   Musculoskeletal: Negative.   Neurological:  Positive for weakness.  Psychiatric/Behavioral:  Negative for behavioral problems and dysphoric mood.     Objective:  BP 129/69   Pulse 90   Temp 98 F (36.7 C) (Oral)   Ht 5\' 7"  (1.702 m)   Wt 147 lb (66.7 kg)   SpO2 95%   BMI 23.02 kg/m      01/18/2023    8:47 AM 01/04/2023    9:31 AM 12/29/2022    9:02 AM  BP/Weight  Systolic BP 129  454  Diastolic BP 69  75  Wt. (Lbs) 147 147 147  BMI 23.02 kg/m2 23.02 kg/m2 23.02 kg/m2      Physical Exam Constitutional:      Appearance: He is well-developed.  Cardiovascular:     Rate and Rhythm: Normal rate.     Heart sounds: Normal heart sounds. No murmur heard. Pulmonary:     Effort: Pulmonary effort is normal.     Breath sounds: Normal breath sounds. No wheezing or rales.  Chest:     Chest wall: No tenderness.  Abdominal:     General: Bowel sounds are normal. There is no distension.     Palpations: Abdomen is soft. There is no mass.     Tenderness: There is no abdominal tenderness.  Musculoskeletal:     Right lower leg: No edema.     Left lower leg: No edema.     Comments: Right spastic hemiparesis Right leg in a foot brace  Neurological:     Mental Status: He is alert and oriented to person, place, and time.   Psychiatric:        Mood and Affect: Mood normal.        Latest Ref Rng & Units 08/26/2022   10:43 AM 07/22/2022    8:32 AM 01/19/2022   10:01 AM  CMP  Glucose 70 - 99 mg/dL 098  119  147   BUN 6 - 24 mg/dL 6  7  9    Creatinine 0.76 - 1.27 mg/dL 8.29  5.62  1.30   Sodium 134 - 144 mmol/L 141  140  139   Potassium 3.5 - 5.2 mmol/L 4.4  4.1  4.3   Chloride 96 - 106 mmol/L 103  100  101   CO2 20 - 29 mmol/L 22  23  21    Calcium 8.7 - 10.2 mg/dL 9.7  86.5  78.4   Total Protein 6.0 - 8.5 g/dL 7.0  7.0    Total Bilirubin 0.0 - 1.2 mg/dL 0.4  0.4    Alkaline Phos 44 - 121 IU/L 98  90    AST 0 - 40 IU/L 35  61    ALT 0 - 44 IU/L 38  73      Lipid Panel     Component Value Date/Time   CHOL 119 07/22/2022 0832   TRIG 101 07/22/2022 0832   HDL 43 07/22/2022 0832   CHOLHDL 3.2 08/31/2021 0843   CHOLHDL 3.5 07/18/2014 0922   VLDL NOT CALC 07/18/2014 0922   LDLCALC 57 07/22/2022 0832    CBC    Component Value Date/Time   WBC 10.2 03/18/2019 1123   RBC 5.00 03/18/2019 1123   HGB 15.2 03/18/2019  1123   HCT 46.2 03/18/2019 1123   PLT 278 03/18/2019 1123   MCV 92.4 03/18/2019 1123   MCH 30.4 03/18/2019 1123   MCHC 32.9 03/18/2019 1123   RDW 12.8 03/18/2019 1123   LYMPHSABS 3.1 03/05/2019 0702   MONOABS 1.6 (H) 03/05/2019 0702   EOSABS 0.0 03/05/2019 0702   BASOSABS 0.0 03/05/2019 1610    Lab Results  Component Value Date   HGBA1C 6.6 01/18/2023    Assessment & Plan:  1. Type 2 diabetes mellitus with other specified complication, with long-term current use of insulin (HCC) Controlled with A1c of 6.6 Continue current regimen Counseled on Diabetic diet, my plate method, 960 minutes of moderate intensity exercise/week Blood sugar logs with fasting goals of 80-120 mg/dl, random of less than 454 and in the event of sugars less than 60 mg/dl or greater than 098 mg/dl encouraged to notify the clinic. Advised on the need for annual eye exams, annual foot exams, Pneumonia  vaccine. - POCT glycosylated hemoglobin (Hb A1C) - CMP14+EGFR - empagliflozin (JARDIANCE) 25 MG TABS tablet; TAKE ONE TABLET BY MOUTH BEFORE BREAKFAST  Dispense: 90 tablet; Refill: 1 - insulin glargine (LANTUS SOLOSTAR) 100 UNIT/ML Solostar Pen; INJECT 10 UNITS UNDER THE SKIN DAILY. (DISCARD PEN 28 DAYS AFTER OPENING)  Dispense: 30 mL; Refill: 2  2. Hypertension associated with diabetes (HCC) Controlled Continue antihypertensive regimen Counseled on blood pressure goal of less than 130/80, low-sodium, DASH diet, medication compliance, 150 minutes of moderate intensity exercise per week. Discussed medication compliance, adverse effects. - amLODipine (NORVASC) 10 MG tablet; TAKE ONE TABLET BY MOUTH EVERY MORNING FOR BLOOD PRESSURE  Dispense: 90 tablet; Refill: 1 - carvedilol (COREG) 12.5 MG tablet; TAKE ONE TABLET BY MOUTH EVERY MORNING and TAKE ONE TABLET BY MOUTH EVERY EVENING  Dispense: 180 tablet; Refill: 1 - hydrALAZINE (APRESOLINE) 100 MG tablet; Take 1 tablet (100 mg total) by mouth 3 (three) times daily.  Dispense: 270 tablet; Refill: 1  3. Tobacco abuse Spent 3 minutes counseling on smoking cessation but he is not ready to quit at this time. - buPROPion (WELLBUTRIN XL) 150 MG 24 hr tablet; Take 1 tablet (150 mg total) by mouth daily.  Dispense: 90 tablet; Refill: 1  4. History of CVA with residual deficit With right hemiparesis Risk factor modification Continue statin - rosuvastatin (CRESTOR) 40 MG tablet; Take 1 tablet (40 mg total) by mouth daily.  Dispense: 90 tablet; Refill: 1  5. Bereavement This could explain the high PHQ-9 coupled with the fact that he misunderstood the question on suicide.  Denies suicidal ideation or intents Offered referral to grief counseling but he declines  6. Hemiparesis affecting right side as late effect of cerebrovascular accident [facility]) Currently receiving Botox injections Completed PT Continue AFO brace    Meds ordered this encounter   Medications   amLODipine (NORVASC) 10 MG tablet    Sig: TAKE ONE TABLET BY MOUTH EVERY MORNING FOR BLOOD PRESSURE    Dispense:  90 tablet    Refill:  1   buPROPion (WELLBUTRIN XL) 150 MG 24 hr tablet    Sig: Take 1 tablet (150 mg total) by mouth daily.    Dispense:  90 tablet    Refill:  1    This prescription was filled on 10/15/2021. Any refills authorized will be placed on file.   carvedilol (COREG) 12.5 MG tablet    Sig: TAKE ONE TABLET BY MOUTH EVERY MORNING and TAKE ONE TABLET BY MOUTH EVERY EVENING    Dispense:  180 tablet    Refill:  1    This prescription was filled on 10/15/2021. Any refills authorized will be placed on file.   empagliflozin (JARDIANCE) 25 MG TABS tablet    Sig: TAKE ONE TABLET BY MOUTH BEFORE BREAKFAST    Dispense:  90 tablet    Refill:  1    This prescription was filled on 07/07/2022. Any refills authorized will be placed on file.   hydrALAZINE (APRESOLINE) 100 MG tablet    Sig: Take 1 tablet (100 mg total) by mouth 3 (three) times daily.    Dispense:  270 tablet    Refill:  1   insulin glargine (LANTUS SOLOSTAR) 100 UNIT/ML Solostar Pen    Sig: INJECT 10 UNITS UNDER THE SKIN DAILY. (DISCARD PEN 28 DAYS AFTER OPENING)    Dispense:  30 mL    Refill:  2   rosuvastatin (CRESTOR) 40 MG tablet    Sig: Take 1 tablet (40 mg total) by mouth daily.    Dispense:  90 tablet    Refill:  1    Follow-up: Return in about 6 months (around 07/20/2023) for Chronic medical conditions.       Hoy Register, MD, FAAFP. [facility] [place], [place] [phone]   01/18/2023, 9:40 AM

## 2023-01-19 LAB — CMP14+EGFR
ALT: 38 IU/L (ref 0–44)
AST: 37 IU/L (ref 0–40)
Albumin/Globulin Ratio: 1.7
Albumin: 4.5 g/dL (ref 3.8–4.9)
Alkaline Phosphatase: 119 IU/L (ref 44–121)
BUN/Creatinine Ratio: 8 — ABNORMAL LOW (ref 9–20)
BUN: 8 mg/dL (ref 6–24)
Bilirubin Total: 0.4 mg/dL (ref 0.0–1.2)
CO2: 20 mmol/L (ref 20–29)
Calcium: 10 mg/dL (ref 8.7–10.2)
Chloride: 101 mmol/L (ref 96–106)
Creatinine, Ser: 0.97 mg/dL (ref 0.76–1.27)
Globulin, Total: 2.6 g/dL (ref 1.5–4.5)
Glucose: 150 mg/dL — ABNORMAL HIGH (ref 70–99)
Potassium: 4.4 mmol/L (ref 3.5–5.2)
Sodium: 137 mmol/L (ref 134–144)
Total Protein: 7.1 g/dL (ref 6.0–8.5)
eGFR: 90 mL/min/{1.73_m2} (ref >59)

## 2023-02-16 ENCOUNTER — Encounter: Payer: Medicare HMO | Attending: Physical Medicine & Rehabilitation | Admitting: Physical Medicine & Rehabilitation

## 2023-02-16 ENCOUNTER — Encounter: Payer: Self-pay | Admitting: Physical Medicine & Rehabilitation

## 2023-02-16 VITALS — BP 168/84 | HR 94 | Ht 67.0 in | Wt 146.0 lb

## 2023-02-16 DIAGNOSIS — G8111 Spastic hemiplegia affecting right dominant side: Secondary | ICD-10-CM | POA: Insufficient documentation

## 2023-02-16 NOTE — Progress Notes (Signed)
Subjective:    Patient ID: Casey Petty, male    DOB: Aug 08, 1961, 62 y.o.   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 UE>LE  who returns for reassessment after botulinum toxin injection 12/29/22  Interval hx seen by PCP for DM, on Jardiance and lantus  Social hx : mom passed away 1 mo ago  RIGHT                                        Biceps:            100 Brachialis 100U                                      Brachioradialis 100                                     FCR                 200                                     PT                   200                                     FDS                 200                                     FDP                 100                                       Right    Soleus 100   Hamstrings 400U  Pain Inventory Average Pain 0 Pain Right Now 0 My pain is  none  In the last 24 hours, has pain interfered with the following? General activity 0 Relation with others 0 Enjoyment of life 0 What TIME of day is your pain at its worst? varies Sleep (in general) Good  Pain is worse with:  no pain Pain improves with:  no pain Relief from Meds:  none  Family History  Problem Relation Age of Onset   Heart attack Father    Cancer Neg Hx    Colon polyps Neg Hx    Colon cancer Neg Hx    Esophageal cancer Neg Hx    Stomach cancer Neg Hx    Rectal cancer Neg Hx    Social History   Socioeconomic History   Marital status: Single    Spouse name: Not on file   Number of children: Not on file   Years of education: Not  on file   Highest education level: Not on file  Occupational History   Not on file  Tobacco Use   Smoking status: Every Day    Current packs/day: 0.25    Types: Cigarettes   Smokeless tobacco: Never   Tobacco comments:    Smoking 4 cigs/day  Vaping Use   Vaping status: Never Used  Substance and Sexual Activity   Alcohol use: Not  Currently   Drug use: No   Sexual activity: Never  Other Topics Concern   Not on file  Social History Narrative   Not on file   Social Determinants of Health   Financial Resource Strain: Low Risk  (01/27/2022)   Overall Financial Resource Strain (CARDIA)    Difficulty of Paying Living Expenses: Not very hard  Food Insecurity: No Food Insecurity (01/04/2023)   Hunger Vital Sign    Worried About Running Out of Food in the Last Year: Never true    Ran Out of Food in the Last Year: Never true  Transportation Needs: No Transportation Needs (01/04/2023)   PRAPARE - Administrator, Civil Service (Medical): No    Lack of Transportation (Non-Medical): No  Physical Activity: Insufficiently Active (01/04/2023)   Exercise Vital Sign    Days of Exercise per Week: 3 days    Minutes of Exercise per Session: 30 min  Stress: No Stress Concern Present (01/04/2023)   [employer] of Occupational Health - Occupational Stress Questionnaire    Feeling of Stress : Not at all  Social Connections: Moderately Isolated (01/04/2023)   Social Connection and Isolation Panel [NHANES]    Frequency of Communication with Friends and Family: More than three times a week    Frequency of Social Gatherings with Friends and Family: Three times a week    Attends Religious Services: Never    Active Member of Clubs or Organizations: No    Attends Banker Meetings: Never    Marital Status: Living with partner   Past Surgical History:  Procedure Laterality Date   WISDOM TOOTH EXTRACTION Bilateral 06/2021   Past Surgical History:  Procedure Laterality Date   WISDOM TOOTH EXTRACTION Bilateral 06/2021   Past Medical History:  Diagnosis Date   Diabetes mellitus without complication (HCC)    on meds   Hyperlipidemia    on meds   Hypertension    on meds   Stroke (HCC) 2020   R hemiparesis-abnormal gait   BP (!) 168/84   Pulse 94   Ht 5\' 7"  (1.702 m)   Wt 146 lb (66.2 kg)   SpO2 97%    BMI 22.87 kg/m   Opioid Risk Score:   Fall Risk Score:  `1  Depression screen [facility] 2/9     02/16/2023    9:28 AM 01/18/2023    8:48 AM 01/04/2023    9:33 AM 12/29/2022    9:11 AM 11/10/2022    9:25 AM 09/29/2022    9:09 AM 06/28/2022    9:17 AM  Depression screen PHQ 2/9  Decreased Interest 0 3 0 0 0 0 0  Down, Depressed, Hopeless 0 3 0 0 0 0 0  PHQ - 2 Score 0 6 0 0 0 0 0  Altered sleeping  3       Tired, decreased energy  3       Change in appetite  0       Feeling bad or failure about yourself   3  Trouble concentrating  3       Moving slowly or fidgety/restless  3       Suicidal thoughts  3       PHQ-9 Score  24          Review of Systems  All other systems reviewed and are negative.      Objective:   Physical Exam  MAS 3 in RIght elbow flexors and Right wrist  and finger flexors  Motor 3- Right delt , bi, tri, grip, 0/5 finger and wrist extensor  Right lower extremity Motor strength is 4/5 in the knee extensor there is no restriction from the hamstring Tone MAS 1 in the right hamstring    Assessment & Plan:  #1.  Right spastic hemiplegia secondary to left CVA.  He has had good relief of spasticity in the hamstring and is now happy he can extend his knee fully.  In addition his elbow and finger spasticity has improved after botulinum toxin injection however the degree of improvement is less than in the lower extremity.  Will increase FDP to 200 units.  Will not inject the pronator teres and use the extra toxin in the biceps and brachialis muscle groups    RIGHT                                        Biceps:            200 Brachialis 200U                                      Brachioradialis 100                                     FCR                 200                                                       FDS                 200                                     FDP                 200     Hamstrings 400U

## 2023-02-16 NOTE — Patient Instructions (Signed)
Will put more medicine in RIght arm next time

## 2023-02-23 ENCOUNTER — Encounter: Payer: Self-pay | Admitting: Podiatry

## 2023-02-23 ENCOUNTER — Ambulatory Visit (INDEPENDENT_AMBULATORY_CARE_PROVIDER_SITE_OTHER): Payer: Medicare HMO | Admitting: Podiatry

## 2023-02-23 VITALS — BP 169/105 | HR 95

## 2023-02-23 DIAGNOSIS — E1165 Type 2 diabetes mellitus with hyperglycemia: Secondary | ICD-10-CM | POA: Diagnosis not present

## 2023-02-23 DIAGNOSIS — L84 Corns and callosities: Secondary | ICD-10-CM | POA: Diagnosis not present

## 2023-02-23 DIAGNOSIS — B351 Tinea unguium: Secondary | ICD-10-CM

## 2023-02-23 DIAGNOSIS — M79675 Pain in left toe(s): Secondary | ICD-10-CM

## 2023-02-23 DIAGNOSIS — M79674 Pain in right toe(s): Secondary | ICD-10-CM

## 2023-02-23 NOTE — Progress Notes (Signed)
  Subjective:  Patient ID: Casey Petty, male    DOB: 1960/11/06,  MRN: [id number]  Chief Complaint  Patient presents with   Diabetes    "Toenails"    62 y.o. male presents due to concern for callus on the left foot.  Patient does have a history of type 2 diabetes with hyperglycemia.  Patient states he has minimal pain with a callus however he does have some sensory loss in his foot due to diabetes. Pt also has difficulty with trimming nails due to thickness and abnormal growth.   Past Medical History:  Diagnosis Date   Diabetes mellitus without complication (HCC)    on meds   Hyperlipidemia    on meds   Hypertension    on meds   Stroke (HCC) 2020   R hemiparesis-abnormal gait    Allergies  Allergen Reactions   Lisinopril Swelling    Angioedema- lips/face    ROS: Negative except as per HPI above  Objective:  General: AAO x3, NAD  Dermatological: Plantar aspect of the left heel there is noted to be a preulcerative hyperkeratotic lesion present diffuse and very thick over the left plantar heel.  Mild pain with palpation of the area there is thick dry skin at this area on debridement there is no underlying ulceration.  Nails thickened elongated dystrophic x 5 bilateral foot with black and gray discoloration  Vascular:  Dorsalis Pedis artery and Posterior Tibial artery pedal pulses are 2/4 bilateral.  Capillary fill time < 3 sec to all digits.   Neruologic: Grossly diminished via light touch bilateral protective sensation diminished  Musculoskeletal: No gross boney pedal deformities bilateral. No pain, crepitus, or limitation noted with foot and ankle range of motion bilateral. Muscular strength 5/5 in all groups tested bilateral.  Gait: Unassisted, Nonantalgic.   No images are attached to the encounter.   Assessment:   1. Pre-ulcerative calluses   2. Pain due to onychomycosis of toenails of both feet   3. Uncontrolled type 2 diabetes mellitus with hyperglycemia (HCC)         Plan:  Patient was evaluated and treated and all questions answered.  # Preulcerative callus plantar aspect left heel All symptomatic hyperkeratoses were safely debrided with a sterile #15 blade to patient's level of comfort without incident. We discussed preventative and palliative care of these lesions including supportive and accommodative shoegear, padding, prefabricated and custom molded accommodative orthoses, use of a pumice stone and lotions/creams daily.   #Onychomycosis with pain  -Nails palliatively debrided as below. -Educated on self-care  Procedure: Nail Debridement Rationale: Pain Type of Debridement: manual, sharp debridement. Instrumentation: Nail nipper, rotary burr. Number of Nails: 10  Return in about 3 months (around 05/26/2023) for [facility].          Corinna Gab, DPM [facility]

## 2023-03-30 ENCOUNTER — Encounter: Payer: Self-pay | Admitting: Physical Medicine & Rehabilitation

## 2023-03-30 ENCOUNTER — Encounter: Payer: Medicare HMO | Attending: Physical Medicine & Rehabilitation | Admitting: Physical Medicine & Rehabilitation

## 2023-03-30 VITALS — BP 151/89 | HR 86 | Ht 67.0 in | Wt 145.0 lb

## 2023-03-30 DIAGNOSIS — G8111 Spastic hemiplegia affecting right dominant side: Secondary | ICD-10-CM | POA: Diagnosis not present

## 2023-03-30 MED ORDER — ABOBOTULINUMTOXINA 500 UNITS IM SOLR
1500.0000 [IU] | Freq: Once | INTRAMUSCULAR | Status: AC
Start: 2023-03-30 — End: 2023-03-30
  Administered 2023-03-30: 1500 [IU] via INTRAMUSCULAR

## 2023-03-30 MED ORDER — SODIUM CHLORIDE (PF) 0.9 % IJ SOLN
6.0000 mL | Freq: Once | INTRAMUSCULAR | Status: AC
Start: 2023-03-30 — End: 2023-03-30
  Administered 2023-03-30: 6 mL via INTRAVENOUS

## 2023-03-30 NOTE — Progress Notes (Signed)
Dysport Injection for spasticity using needle EMG guidance  Pt has been responding well to botulinum toxin injections for stroke related RIght spastic hemiparesis  Dilution: 200 Units/ml Indication: Severe spasticity which interferes with ADL,mobility and/or  hygiene and is unresponsive to medication management and other conservative care Informed consent was obtained after describing risks and benefits of the procedure with the patient. This includes bleeding, bruising, infection, excessive weakness, or medication side effects. A REMS form is on file and signed. Needle:  needle electrode Number of units per muscle RIGHT  Biceps:            200     Brachialis 200U  Brachioradialis 100 FCR                 200                   FDS                 200 FDP                 200     Hamstrings 400U All injections were done after obtaining appropriate EMG activity and after negative drawback for blood. The patient tolerated the procedure well. Post procedure instructions were given. A followup appointment was made 6 wk.

## 2023-05-09 ENCOUNTER — Other Ambulatory Visit: Payer: Self-pay | Admitting: Family Medicine

## 2023-05-09 NOTE — Telephone Encounter (Signed)
Requested Prescriptions  Pending Prescriptions Disp Refills   baclofen (LIORESAL) 10 MG tablet [Pharmacy Med Name: baclofen 10 mg tablet] 180 tablet 2    Sig: TAKE TWO TABLETS BY MOUTH THREE TIMES DAILY @ 9AM, 1PM, AND 5PM     Analgesics:  Muscle Relaxants - baclofen Passed - 05/09/2023  9:05 AM      Passed - Cr in normal range and within 180 days    Creat  Date Value Ref Range Status  07/18/2014 1.14 0.50 - 1.35 mg/dL Final   Creatinine, Ser  Date Value Ref Range Status  01/18/2023 0.97 0.76 - 1.27 mg/dL Final         Passed - eGFR is 30 or above and within 180 days    GFR, Est African American  Date Value Ref Range Status  07/18/2014 86 mL/min Final   GFR calc Af Amer  Date Value Ref Range Status  09/01/2020 92 >59 mL/min/1.73 Final    Comment:    **In accordance with recommendations from the NKF-ASN Task force,**   [facility] is in the process of updating its eGFR calculation to the   2021 CKD-EPI creatinine equation that estimates kidney function   without a race variable.    GFR, Est Non African American  Date Value Ref Range Status  07/18/2014 74 mL/min Final    Comment:      The estimated GFR is a calculation valid for adults (>=78 years old) that uses the CKD-EPI algorithm to adjust for age and sex. It is   not to be used for children, pregnant women, hospitalized patients,    patients on dialysis, or with rapidly changing kidney function. According to the NKDEP, eGFR >89 is normal, 60-89 shows mild impairment, 30-59 shows moderate impairment, 15-29 shows severe impairment and <15 is ESRD.      GFR calc non Af Amer  Date Value Ref Range Status  09/01/2020 79 >59 mL/min/1.73 Final   eGFR  Date Value Ref Range Status  01/18/2023 90 >59 mL/min/1.73 Final         Passed - Valid encounter within last 6 months    Recent Outpatient Visits           3 months ago Type 2 diabetes mellitus with other specified complication, with long-term current use of insulin  (HCC)   [facility] [place], [place], MD   9 months ago Type 2 diabetes mellitus with other specified complication, with long-term current use of insulin (HCC)   [place] [facility] [facility] [place], Boy[place], MD   1 year ago Type 2 diabetes mellitus with other specified complication, with long-term current use of insulin (HCC)   [facility] [place], [place], MD   1 year ago Type 2 diabetes mellitus with other specified complication, with long-term current use of insulin (HCC)   [place] [facility] [place], Odette [place]s, MD   1 year ago Type 2 diabetes mellitus with other specified complication, with long-term current use of insulin [facility] [place])   [place] [facility] Hoy Register, MD       Future Appointments             In 3 months Hoy Register, MD [facility]

## 2023-05-11 ENCOUNTER — Encounter: Payer: Self-pay | Admitting: Physical Medicine & Rehabilitation

## 2023-05-11 ENCOUNTER — Encounter: Payer: Medicare HMO | Attending: Physical Medicine & Rehabilitation | Admitting: Physical Medicine & Rehabilitation

## 2023-05-11 VITALS — BP 161/89 | HR 83 | Ht 67.0 in | Wt 143.0 lb

## 2023-05-11 DIAGNOSIS — G8111 Spastic hemiplegia affecting right dominant side: Secondary | ICD-10-CM | POA: Insufficient documentation

## 2023-05-11 NOTE — Progress Notes (Signed)
Subjective:    Patient ID: Casey Petty, male    DOB: 05-30-1961, 62 y.o.   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  He does have severe spasticity upper limb greater than lower limb he is back after a alteration of his botulinum toxin dosage performed 6 weeks ago.  Patient feels like his lower extremity spasticity is well-controlled he still has some difficulty with extending his fingers.  His proximal arm feels looser.  Pain Inventory Average Pain 0 Pain Right Now 0 My pain is  no pain  In the last 24 hours, has pain interfered with the following? General activity 0 Relation with others 0 Enjoyment of life 0 What TIME of day is your pain at its worst? na Sleep (in general) Poor  Pain is worse with:  no pain Pain improves with:  no pain Relief from Meds:  no pain  Family History  Problem Relation Age of Onset   Heart attack Father    Cancer Neg Hx    Colon polyps Neg Hx    Colon cancer Neg Hx    Esophageal cancer Neg Hx    Stomach cancer Neg Hx    Rectal cancer Neg Hx    Social History   Socioeconomic History   Marital status: Single    Spouse name: Not on file   Number of children: Not on file   Years of education: Not on file   Highest education level: Not on file  Occupational History   Not on file  Tobacco Use   Smoking status: Every Day    Current packs/day: 0.25    Types: Cigarettes   Smokeless tobacco: Never   Tobacco comments:    Smoking 4 cigs/day  Vaping Use   Vaping status: Never Used  Substance and Sexual Activity   Alcohol use: Yes    Comment: 2 times a week - beer   Drug use: No   Sexual activity: Never  Other Topics Concern   Not on file  Social History Narrative   Not on file   Social Determinants of Health   Financial Resource Strain: Low Risk  (01/27/2022)   Overall Financial  Resource Strain (CARDIA)    Difficulty of Paying Living Expenses: Not very hard  Food Insecurity: No Food Insecurity (01/04/2023)   Hunger Vital Sign    Worried About Running Out of Food in the Last Year: Never true    Ran Out of Food in the Last Year: Never true  Transportation Needs: No Transportation Needs (01/04/2023)   PRAPARE - Administrator, Civil Service (Medical): No    Lack of Transportation (Non-Medical): No  Physical Activity: Insufficiently Active (01/04/2023)   Exercise Vital Sign    Days of Exercise per Week: 3 days    Minutes of Exercise per Session: 30 min  Stress: No Stress Concern Present (01/04/2023)   [employer] of Occupational Health - Occupational Stress Questionnaire    Feeling of Stress : Not at all  Social Connections: Moderately Isolated (01/04/2023)   Social Connection and Isolation Panel [NHANES]    Frequency of Communication with Friends and Family: More than three times a week    Frequency of Social Gatherings with Friends and Family: Three times a week    Attends Religious Services: Never    Active Member of Clubs or Organizations:  No    Attends Banker Meetings: Never    Marital Status: Living with partner   Past Surgical History:  Procedure Laterality Date   WISDOM TOOTH EXTRACTION Bilateral 06/2021   Past Surgical History:  Procedure Laterality Date   WISDOM TOOTH EXTRACTION Bilateral 06/2021   Past Medical History:  Diagnosis Date   Diabetes mellitus without complication (HCC)    on meds   Hyperlipidemia    on meds   Hypertension    on meds   Stroke (HCC) 2020   R hemiparesis-abnormal gait   BP (!) 161/89   Pulse 83   Ht 5\' 7"  (1.702 m)   Wt 143 lb (64.9 kg)   SpO2 98%   BMI 22.40 kg/m   Opioid Risk Score:   Fall Risk Score:  `1  Depression screen [facility] 2/9     02/16/2023    9:28 AM 01/18/2023    8:48 AM 01/04/2023    9:33 AM 12/29/2022    9:11 AM 11/10/2022    9:25 AM 09/29/2022    9:09 AM  06/28/2022    9:17 AM  Depression screen PHQ 2/9  Decreased Interest 0 3 0 0 0 0 0  Down, Depressed, Hopeless 0 3 0 0 0 0 0  PHQ - 2 Score 0 6 0 0 0 0 0  Altered sleeping  3       Tired, decreased energy  3       Change in appetite  0       Feeling bad or failure about yourself   3       Trouble concentrating  3       Moving slowly or fidgety/restless  3       Suicidal thoughts  3       PHQ-9 Score  24           Review of Systems  Neurological:  Positive for weakness.  All other systems reviewed and are negative.     Objective:   Physical Exam Motor strength is 3 - at the deltoid bicep to minus tricep to minus finger flexors 0 finger extensors. Tone Ashworth score Elbow flexors MAS 3 Finger flexors MAS 3 Thumb flexor MAS 2 Wrist flexor MAS 3 Lumbricals MAS 3 Hamstring MAS 1       Assessment & Plan:  #1.  Improvement in tone lower extremity greater than upper extremity following botulinum toxin injection 6 weeks ago.  Will reduce dose to hamstring and increase dose to upper extremity, namely lumbricals repeat in 6 weeks discussed with patient agrees with plan RIGHT  Biceps:            200     Brachialis 200U  Brachioradialis 100 FCR                 200                   FDS                 200 FDP                 200 Lumbricals  100    Hamstrings 300U

## 2023-05-12 ENCOUNTER — Telehealth: Payer: Self-pay

## 2023-05-12 NOTE — Telephone Encounter (Signed)
Copied from CRM [id number]. Topic: General - Other >> May 12, 2023 11:59 AM [place] B wrote: Reason for CRM: Khadijah from [facility] called in about status of dhb 3051 faxed over on 10/01. Please call back

## 2023-05-15 NOTE — Telephone Encounter (Signed)
Attempted to contact patient to schedule a telephone appointment with PCP to get office notes.

## 2023-05-17 ENCOUNTER — Telehealth: Payer: Self-pay

## 2023-05-17 NOTE — Telephone Encounter (Signed)
Copied from CRM [id number]. Topic: General - Other >> May 17, 2023 11:11 AM Macon Large wrote: Reason for CRM: Khadesjah with [facility] called for an update on the status of [facility] 3051 form that was faxed on 10/01. Cb# [phone]

## 2023-05-18 NOTE — Telephone Encounter (Signed)
Pt has been scheduled for telephone visit to complete paperwork. Form will be faxed after appointment next week.

## 2023-05-23 ENCOUNTER — Ambulatory Visit: Payer: Medicare HMO | Attending: Family Medicine | Admitting: Family Medicine

## 2023-05-23 DIAGNOSIS — E1159 Type 2 diabetes mellitus with other circulatory complications: Secondary | ICD-10-CM | POA: Diagnosis not present

## 2023-05-23 DIAGNOSIS — I152 Hypertension secondary to endocrine disorders: Secondary | ICD-10-CM | POA: Diagnosis not present

## 2023-05-23 DIAGNOSIS — I6930 Unspecified sequelae of cerebral infarction: Secondary | ICD-10-CM

## 2023-05-23 NOTE — Progress Notes (Signed)
Virtual Visit via Telephone Note  I connected with Casey Petty, on 05/23/2023 at 8:11 AM by telephone and verified that I am speaking with the correct person using two identifiers.   Consent: I discussed the limitations, risks, security and privacy concerns of performing an evaluation and management service by telephone and the availability of in person appointments. I also discussed with the patient that there may be a patient responsible charge related to this service. The patient expressed understanding and agreed to proceed.   Location of Patient: Home  Location of Provider: Clinic   Persons participating in Telemedicine visit: Casey Petty Dr. Alvis Lemmings     History of Present Illness: Casey Petty is a 62 y.o. year old male with a history of  type 2 diabetes mellitus (A1c 6.6) stroke (with right spastic hemiparesis s/p Botox injections by rehab), right adhesive capsulitis, dysarthria, Hypertension, Nicotine dependence (3 Cigarettes /day, <20 pack years)    Discussed the use of AI scribe software for clinical note transcription with the patient, who gave verbal consent to proceed.   The patient, currently receiving home care services, reports satisfaction with the current level of care. He receives four hours of assistance six days a week from a home care agency. The services provided include cleaning and garbage disposal. The patient finds this level of assistance sufficient for his needs. He has an upcoming appointment scheduled for January for chronic disease management. The patient confirms that he still has his medications, which were last refilled in June.      Past Medical History:  Diagnosis Date   Diabetes mellitus without complication (HCC)    on meds   Hyperlipidemia    on meds   Hypertension    on meds   Stroke [facility]) 2020   R hemiparesis-abnormal gait   Allergies  Allergen Reactions   Lisinopril Swelling    Angioedema- lips/face    Current  Outpatient Medications on File Prior to Visit  Medication Sig Dispense Refill   amLODipine (NORVASC) 10 MG tablet TAKE ONE TABLET BY MOUTH EVERY MORNING FOR BLOOD PRESSURE 90 tablet 1   aspirin EC 81 MG tablet Take 1 tablet (81 mg total) by mouth daily. 100 tablet 0   baclofen (LIORESAL) 10 MG tablet TAKE TWO TABLETS BY MOUTH THREE TIMES DAILY @ 9AM, 1PM, AND 5PM 180 tablet 2   Blood Glucose Monitoring Suppl (ACCU-CHEK GUIDE) w/Device KIT 1 each by Does not apply route in the morning, at noon, in the evening, and at bedtime. 1 kit 0   Blood Glucose Monitoring Suppl (TRUE METRIX METER) w/Device KIT Check blood sugar fasting and before meals and again if pt feels bad (symptoms of hypo). 1 kit 0   buPROPion (WELLBUTRIN XL) 150 MG 24 hr tablet Take 1 tablet (150 mg total) by mouth daily. 90 tablet 1   carvedilol (COREG) 12.5 MG tablet TAKE ONE TABLET BY MOUTH EVERY MORNING and TAKE ONE TABLET BY MOUTH EVERY EVENING 180 tablet 1   COMFORT EZ PEN NEEDLES 32G X 4 MM MISC USE TO INJECT insulin 100 each 2   Continuous Blood Gluc Receiver (DEXCOM G6 RECEIVER) DEVI Use to check blood sugar TID. 1 each 0   Continuous Blood Gluc Sensor (DEXCOM G6 SENSOR) MISC Use to check blood sugar TID. 3 each 3   Continuous Blood Gluc Transmit (DEXCOM G6 TRANSMITTER) MISC Use to check blood sugar TID. 1 each 3   empagliflozin (JARDIANCE) 25 MG TABS tablet TAKE ONE TABLET BY MOUTH BEFORE  BREAKFAST 90 tablet 1   ezetimibe (ZETIA) 10 MG tablet Take 1 tablet (10 mg total) by mouth every evening. 90 tablet 2   glucose blood (ACCU-CHEK GUIDE) test strip Use as instructed up to 4 times daily 100 each 12   glucose blood (TRUE METRIX BLOOD GLUCOSE TEST) test strip Use as instructed before every meal nightly up to 4 times daily 100 each 12   hydrALAZINE (APRESOLINE) 100 MG tablet Take 1 tablet (100 mg total) by mouth 3 (three) times daily. 270 tablet 1   insulin glargine (LANTUS SOLOSTAR) 100 UNIT/ML Solostar Pen INJECT 10 UNITS UNDER  THE SKIN DAILY. (DISCARD PEN 28 DAYS AFTER OPENING) 30 mL 2   Misc. Devices MISC Wheelchair with Accessories: elevating leg rests (ELRs), wheel locks, extensions and anti-tippers. Cane or walker will not suffice 1 each 0   Misc. Devices MISC Rollator walker with seat.  Diagnosis - Stroke 1 each 0   rosuvastatin (CRESTOR) 40 MG tablet Take 1 tablet (40 mg total) by mouth daily. 90 tablet 1   TRUEplus Lancets 28G MISC Check blood sugar before every meal, nightly up to 4 times a day. 100 each 12   UREA 10 HYDRATING 10 % cream APPLY TO AFFECTED AREA EVERY DAY AS NEEDED 85 g 0   No current facility-administered medications on file prior to visit.    ROS: See HPI  Observations/Objective: Awake, alert, oriented x3 Not in acute distress Normal mood      Latest Ref Rng & Units 01/18/2023    9:18 AM 08/26/2022   10:43 AM 07/22/2022    8:32 AM  CMP  Glucose 70 - 99 mg/dL 253  664  403   BUN 6 - 24 mg/dL 8  6  7    Creatinine 0.76 - 1.27 mg/dL 4.74  2.59  5.63   Sodium 134 - 144 mmol/L 137  141  140   Potassium 3.5 - 5.2 mmol/L 4.4  4.4  4.1   Chloride 96 - 106 mmol/L 101  103  100   CO2 20 - 29 mmol/L 20  22  23    Calcium 8.7 - 10.2 mg/dL 87.5  9.7  64.3   Total Protein 6.0 - 8.5 g/dL 7.1  7.0  7.0   Total Bilirubin 0.0 - 1.2 mg/dL 0.4  0.4  0.4   Alkaline Phos 44 - 121 IU/L 119  98  90   AST 0 - 40 IU/L 37  35  61   ALT 0 - 44 IU/L 38  38  73     Lipid Panel     Component Value Date/Time   CHOL 119 07/22/2022 0832   TRIG 101 07/22/2022 0832   HDL 43 07/22/2022 0832   CHOLHDL 3.2 08/31/2021 0843   CHOLHDL 3.5 07/18/2014 0922   VLDL NOT CALC 07/18/2014 0922   LDLCALC 57 07/22/2022 0832   LABVLDL 19 07/22/2022 0832    Lab Results  Component Value Date   HGBA1C 6.6 01/18/2023    Assessment and Plan: Assessment and Plan    Home Care   Patient is currently receiving 4 hours of home care 6 days a week, which is sufficient for his needs. The home care agency assists with  cleaning and garbage disposal.   -Complete and send form to home care agency to justify the need for continued home care.    Medication Management   Patient confirmed that he still has his medications which were last refilled in June.   -Continue  current medications as prescribed.    Follow-up   Next appointment scheduled for January.   -Continue to monitor patient's condition and adjust treatment plan as necessary during the next appointment.        Follow Up Instructions: Keep previously scheduled appointment   I discussed the assessment and treatment plan with the patient. The patient was provided an opportunity to ask questions and all were answered. The patient agreed with the plan and demonstrated an understanding of the instructions.   The patient was advised to call back or seek an in-person evaluation if the symptoms worsen or if the condition fails to improve as anticipated.     I provided 12 minutes total of non-face-to-face time during this encounter.   Hoy Register, MD, FAAFP. [facility] [place], [place] [phone]   05/23/2023, 8:11 AM

## 2023-05-25 ENCOUNTER — Ambulatory Visit: Payer: Medicare HMO | Admitting: Podiatry

## 2023-05-25 DIAGNOSIS — L84 Corns and callosities: Secondary | ICD-10-CM | POA: Diagnosis not present

## 2023-05-25 DIAGNOSIS — M79674 Pain in right toe(s): Secondary | ICD-10-CM | POA: Diagnosis not present

## 2023-05-25 DIAGNOSIS — B351 Tinea unguium: Secondary | ICD-10-CM

## 2023-05-25 DIAGNOSIS — E1165 Type 2 diabetes mellitus with hyperglycemia: Secondary | ICD-10-CM

## 2023-05-25 DIAGNOSIS — M79675 Pain in left toe(s): Secondary | ICD-10-CM | POA: Diagnosis not present

## 2023-05-25 NOTE — Progress Notes (Signed)
Subjective:  Patient ID: Casey Petty, male    DOB: 1961/05/12,  MRN: [id number]  Chief Complaint  Patient presents with   Diabetic Foot Care    Nails, calluses. Callus right 5th met head and heel    62 y.o. male presents due to concern for callus on the left foot.  Patient does have a history of type 2 diabetes with hyperglycemia.  Patient states he has minimal pain with a callus however he does have some sensory loss in his foot due to diabetes. Pt also has difficulty with trimming nails due to thickness and abnormal growth.   Past Medical History:  Diagnosis Date   Diabetes mellitus without complication (HCC)    on meds   Hyperlipidemia    on meds   Hypertension    on meds   Stroke (HCC) 2020   R hemiparesis-abnormal gait    Allergies  Allergen Reactions   Lisinopril Swelling    Angioedema- lips/face    ROS: Negative except as per HPI above  Objective:  General: AAO x3, NAD  Dermatological: Plantar aspect of the left heel there is noted to be a preulcerative hyperkeratotic lesion present diffuse and very thick over the left plantar heel.  Mild pain with palpation of the area there is thick dry skin at this area on debridement there is no underlying ulceration.  Nails thickened elongated dystrophic x 5 bilateral foot with black and gray discoloration  Vascular:  Dorsalis Pedis artery and Posterior Tibial artery pedal pulses are 2/4 bilateral.  Capillary fill time < 3 sec to all digits.   Neruologic: Grossly diminished via light touch bilateral protective sensation diminished  Musculoskeletal: No gross boney pedal deformities bilateral. No pain, crepitus, or limitation noted with foot and ankle range of motion bilateral. Muscular strength 5/5 in all groups tested bilateral.  Gait: Unassisted, Nonantalgic.   No images are attached to the encounter.   Assessment:   1. Pre-ulcerative calluses   2. Pain due to onychomycosis of toenails of both feet   3. Uncontrolled  type 2 diabetes mellitus with hyperglycemia (HCC)         Plan:  Patient was evaluated and treated and all questions answered.  # Preulcerative callus plantar aspect left heel All symptomatic hyperkeratoses were safely debrided with a sterile #15 blade to patient's level of comfort without incident. We discussed preventative and palliative care of these lesions including supportive and accommodative shoegear, padding, prefabricated and custom molded accommodative orthoses, use of a pumice stone and lotions/creams daily.   #Onychomycosis with pain  -Nails palliatively debrided as below. -Educated on self-care  Procedure: Nail Debridement Rationale: Pain Type of Debridement: manual, sharp debridement. Instrumentation: Nail nipper, rotary burr. Number of Nails: 10  Return in about 3 months (around 08/25/2023) for [facility].          Corinna Gab, DPM [facility]

## 2023-07-04 ENCOUNTER — Encounter: Payer: Medicare HMO | Attending: Physical Medicine & Rehabilitation | Admitting: Physical Medicine & Rehabilitation

## 2023-07-04 ENCOUNTER — Encounter: Payer: Self-pay | Admitting: Physical Medicine & Rehabilitation

## 2023-07-04 VITALS — BP 153/91 | HR 107 | Ht 67.0 in | Wt 142.0 lb

## 2023-07-04 DIAGNOSIS — G8111 Spastic hemiplegia affecting right dominant side: Secondary | ICD-10-CM | POA: Diagnosis not present

## 2023-07-04 MED ORDER — ABOBOTULINUMTOXINA 500 UNITS IM SOLR
1500.0000 [IU] | Freq: Once | INTRAMUSCULAR | Status: AC
Start: 2023-07-04 — End: 2023-07-04
  Administered 2023-07-04: 1500 [IU] via INTRAMUSCULAR

## 2023-07-04 MED ORDER — SODIUM CHLORIDE (PF) 0.9 % IJ SOLN
3.0000 mL | Freq: Once | INTRAMUSCULAR | Status: AC
Start: 2023-07-04 — End: 2023-07-04
  Administered 2023-07-04: 3 mL

## 2023-07-04 NOTE — Patient Instructions (Signed)
You received a Dysport injection today. You may experience muscle pains and aches. He may apply ice 20 minutes every 2 hours as needed for the next 24-48 hours. He also noticed bleeding or bruising in the areas that were injected. May apply Band-Aid. If this bruising is extensive, please notify our office. If there is evidence of increasing redness that occurs 2-3 days after injection. Please call our office. This could be a sign of infection. It is very rare, however. You may experience some muscle weakness in the muscles and injected. This would likely start in about one week.

## 2023-07-04 NOTE — Progress Notes (Signed)
Dysport Injection for spasticity using needle EMG guidance  Pt has been responding well to botulinum toxin injections for stroke related RIght spastic hemiparesis  Dilution: 200 Units/ml Indication: Severe spasticity which interferes with ADL,mobility and/or  hygiene and is unresponsive to medication management and other conservative care Informed consent was obtained after describing risks and benefits of the procedure with the patient. This includes bleeding, bruising, infection, excessive weakness, or medication side effects. A REMS form is on file and signed. Needle:  needle electrode Number of units per muscle  RIGHT  Biceps:            200     Brachialis 200U  Brachioradialis 100 FCR                 200                   FDS                 200 FDP                 200 Lumbricals  100    Hamstrings 300U All injections were done after obtaining appropriate EMG activity and after negative drawback for blood. The patient tolerated the procedure well. Post procedure instructions were given. A followup appointment was made 6 wk.

## 2023-07-09 ENCOUNTER — Other Ambulatory Visit: Payer: Self-pay | Admitting: Family Medicine

## 2023-07-09 DIAGNOSIS — I6930 Unspecified sequelae of cerebral infarction: Secondary | ICD-10-CM

## 2023-07-09 DIAGNOSIS — E1159 Type 2 diabetes mellitus with other circulatory complications: Secondary | ICD-10-CM

## 2023-07-12 ENCOUNTER — Telehealth: Payer: Self-pay | Admitting: Family Medicine

## 2023-07-12 NOTE — Telephone Encounter (Signed)
[facility] CALLING   Medication Refill -  Most Recent Primary Care Visit:  Provider: Hoy Register  Department: [facility]  Visit Type: MYCHART VIDEO VISIT  Date: 05/23/2023  Medication: carvedilol (COREG) 12.5 MG tablet [address]     [facility] [place], [place] - [address] [place] [address] Phone: [phone] Fax: [phone]   Has the prescription been filled recently? Yes  Is the patient out of the medication? Unknown rx called

## 2023-07-12 NOTE — Telephone Encounter (Signed)
[facility] called and spoke to [place], [facility] about the refill(s) carvedilol requested. Advised it was sent on 07/10/23 #180/0 refill(s). She states they have rx on file from 11/27 and received 07/10/23 rx. Will refill and send out for pt. No further assistance noted.

## 2023-07-21 ENCOUNTER — Ambulatory Visit: Payer: Medicare HMO | Admitting: Physical Medicine & Rehabilitation

## 2023-08-10 ENCOUNTER — Encounter: Payer: Self-pay | Admitting: Family Medicine

## 2023-08-10 ENCOUNTER — Ambulatory Visit: Payer: Medicare HMO | Attending: Family Medicine | Admitting: Family Medicine

## 2023-08-10 VITALS — BP 127/81 | HR 75 | Wt 142.2 lb

## 2023-08-10 DIAGNOSIS — Z720 Tobacco use: Secondary | ICD-10-CM

## 2023-08-10 DIAGNOSIS — Z7984 Long term (current) use of oral hypoglycemic drugs: Secondary | ICD-10-CM | POA: Diagnosis not present

## 2023-08-10 DIAGNOSIS — Z794 Long term (current) use of insulin: Secondary | ICD-10-CM

## 2023-08-10 DIAGNOSIS — Z23 Encounter for immunization: Secondary | ICD-10-CM | POA: Diagnosis not present

## 2023-08-10 DIAGNOSIS — E1169 Type 2 diabetes mellitus with other specified complication: Secondary | ICD-10-CM | POA: Diagnosis not present

## 2023-08-10 DIAGNOSIS — F17210 Nicotine dependence, cigarettes, uncomplicated: Secondary | ICD-10-CM

## 2023-08-10 DIAGNOSIS — I739 Peripheral vascular disease, unspecified: Secondary | ICD-10-CM | POA: Diagnosis not present

## 2023-08-10 DIAGNOSIS — E1159 Type 2 diabetes mellitus with other circulatory complications: Secondary | ICD-10-CM | POA: Diagnosis not present

## 2023-08-10 DIAGNOSIS — I6930 Unspecified sequelae of cerebral infarction: Secondary | ICD-10-CM | POA: Diagnosis not present

## 2023-08-10 DIAGNOSIS — I152 Hypertension secondary to endocrine disorders: Secondary | ICD-10-CM | POA: Diagnosis not present

## 2023-08-10 LAB — POCT GLYCOSYLATED HEMOGLOBIN (HGB A1C): HbA1c, POC (controlled diabetic range): 6.2 % (ref 0.0–7.0)

## 2023-08-10 MED ORDER — EMPAGLIFLOZIN 25 MG PO TABS: ORAL_TABLET | ORAL | 1 refills | Status: DC

## 2023-08-10 MED ORDER — ROSUVASTATIN CALCIUM 40 MG PO TABS: 40.0000 mg | ORAL_TABLET | Freq: Every day | ORAL | 1 refills | Status: DC

## 2023-08-10 MED ORDER — LANTUS SOLOSTAR 100 UNIT/ML ~~LOC~~ SOPN: PEN_INJECTOR | SUBCUTANEOUS | 2 refills | Status: DC

## 2023-08-10 MED ORDER — HYDRALAZINE HCL 100 MG PO TABS: 100.0000 mg | ORAL_TABLET | Freq: Three times a day (TID) | ORAL | 1 refills | Status: DC

## 2023-08-10 MED ORDER — BUPROPION HCL ER (XL) 150 MG PO TB24: 150.0000 mg | ORAL_TABLET | Freq: Every day | ORAL | 1 refills | Status: DC

## 2023-08-10 MED ORDER — CARVEDILOL 12.5 MG PO TABS: ORAL_TABLET | ORAL | 0 refills | Status: DC

## 2023-08-10 MED ORDER — AMLODIPINE BESYLATE 10 MG PO TABS: ORAL_TABLET | ORAL | 0 refills | Status: DC

## 2023-08-10 MED ORDER — BACLOFEN 10 MG PO TABS: 10.0000 mg | ORAL_TABLET | Freq: Three times a day (TID) | ORAL | 1 refills | Status: DC

## 2023-08-10 NOTE — Progress Notes (Signed)
 Subjective:  Patient ID: Casey Petty, male    DOB: [birth year]  Age: 63 y.o. MRN: [id number]  CC: Medical Management of Chronic Issues   HPI Casey Petty is a 63 y.o. year old male with a history of  type 2 diabetes mellitus (A1c 6.6) stroke (with right spastic hemiparesis s/p Botox injections by rehab), right adhesive capsulitis, dysarthria, Hypertension, Nicotine  dependence (3 Cigarettes /day, <20 pack years)   Interval History: Discussed the use of AI scribe software for clinical note transcription with the patient, who gave verbal consent to proceed.  He presents for a routine follow-up. He reports excellent adherence to his diet, eating less rice and more vegetables, which has resulted in an improved A1c from 6.6 to 6.2. He is currently taking Jardiance  and 10 units of Lantus  insulin  daily for diabetes management. He also would like a flu shot today. He remains adherent with his antihypertensive and statin.  The patient also has a history of smoking, currently down to one cigarette a day. He is taking Wellbutrin  to aid in smoking cessation. He also has a right-sided weakness post stroke for which he is receiving Botox injections from a rehab doctor.  The patient had an eye exam last year and is due for another one this year. He also reports having eaten breakfast this morning.        Past Medical History:  Diagnosis Date   Diabetes mellitus without complication (HCC)    on meds   Hyperlipidemia    on meds   Hypertension    on meds   Stroke [facility]) 2020   R hemiparesis-abnormal gait    Past Surgical History:  Procedure Laterality Date   WISDOM TOOTH EXTRACTION Bilateral 06/2021    Family History  Problem Relation Age of Onset   Heart attack Father    Cancer Neg Hx    Colon polyps Neg Hx    Colon cancer Neg Hx    Esophageal cancer Neg Hx    Stomach cancer Neg Hx    Rectal cancer Neg Hx     Social History   Socioeconomic History   Marital status: Single     Spouse name: Not on file   Number of children: Not on file   Years of education: Not on file   Highest education level: Not on file  Occupational History   Not on file  Tobacco Use   Smoking status: Every Day    Current packs/day: 0.25    Types: Cigarettes   Smokeless tobacco: Never   Tobacco comments:    Smoking 4 cigs/day  Vaping Use   Vaping status: Never Used  Substance and Sexual Activity   Alcohol use: Yes    Comment: 2 times a week - beer   Drug use: No   Sexual activity: Never  Other Topics Concern   Not on file  Social History Narrative   Not on file   Social Drivers of Health   Financial Resource Strain: Low Risk  (08/10/2023)   Overall Financial Resource Strain (CARDIA)    Difficulty of Paying Living Expenses: Not hard at all  Food Insecurity: No Food Insecurity (08/10/2023)   Hunger Vital Sign    Worried About Running Out of Food in the Last Year: Never true    Ran Out of Food in the Last Year: Never true  Transportation Needs: No Transportation Needs (08/10/2023)   PRAPARE - Administrator, Civil Service (Medical): No    Lack of Transportation (  Non-Medical): No  Physical Activity: Insufficiently Active (01/04/2023)   Exercise Vital Sign    Days of Exercise per Week: 3 days    Minutes of Exercise per Session: 30 min  Stress: No Stress Concern Present (08/10/2023)   [employer] of Occupational Health - Occupational Stress Questionnaire    Feeling of Stress : Not at all  Social Connections: Socially Integrated (08/10/2023)   Social Connection and Isolation Panel [NHANES]    Frequency of Communication with Friends and Family: More than three times a week    Frequency of Social Gatherings with Friends and Family: More than three times a week    Attends Religious Services: More than 4 times per year    Active Member of [employer] or Organizations: Yes    Attends Banker Meetings: 1 to 4 times per year    Marital Status: Married    Allergies   Allergen Reactions   Lisinopril  Swelling    Angioedema- lips/face    Outpatient Medications Prior to Visit  Medication Sig Dispense Refill   aspirin  EC 81 MG tablet Take 1 tablet (81 mg total) by mouth daily. 100 tablet 0   Blood Glucose Monitoring Suppl (ACCU-CHEK GUIDE) w/Device KIT 1 each by Does not apply route in the morning, at noon, in the evening, and at bedtime. 1 kit 0   Blood Glucose Monitoring Suppl (TRUE METRIX METER) w/Device KIT Check blood sugar fasting and before meals and again if pt feels bad (symptoms of hypo). 1 kit 0   COMFORT EZ PEN NEEDLES 32G X 4 MM MISC USE TO INJECT insulin  100 each 2   Continuous Blood Gluc Receiver (DEXCOM G6 RECEIVER) DEVI Use to check blood sugar TID. 1 each 0   Continuous Blood Gluc Sensor (DEXCOM G6 SENSOR) MISC Use to check blood sugar TID. 3 each 3   Continuous Blood Gluc Transmit (DEXCOM G6 TRANSMITTER) MISC Use to check blood sugar TID. 1 each 3   ezetimibe  (ZETIA ) 10 MG tablet Take 1 tablet (10 mg total) by mouth every evening. 90 tablet 2   glucose blood (ACCU-CHEK GUIDE) test strip Use as instructed up to 4 times daily 100 each 12   glucose blood (TRUE METRIX BLOOD GLUCOSE TEST) test strip Use as instructed before every meal nightly up to 4 times daily 100 each 12   Misc. Devices MISC Wheelchair with Accessories: elevating leg rests (ELRs), wheel locks, extensions and anti-tippers. Cane or walker will not suffice 1 each 0   Misc. Devices MISC Rollator walker with seat.  Diagnosis - Stroke 1 each 0   TRUEplus Lancets 28G MISC Check blood sugar before every meal, nightly up to 4 times a day. 100 each 12   UREA  10 HYDRATING 10 % cream APPLY TO AFFECTED AREA EVERY DAY AS NEEDED 85 g 0   amLODipine  (NORVASC ) 10 MG tablet TAKE ONE TABLET BY MOUTH DAILY AT 9 AM EVERY MORNING FOR BLOOD PRESSURE 90 tablet 0   baclofen  (LIORESAL ) 10 MG tablet TAKE TWO TABLETS BY MOUTH THREE TIMES DAILY @ 9AM, 1PM, AND 5PM 180 tablet 2   buPROPion  (WELLBUTRIN  XL)  150 MG 24 hr tablet Take 1 tablet (150 mg total) by mouth daily. 90 tablet 1   carvedilol  (COREG ) 12.5 MG tablet TAKE ONE TABLET BY MOUTH TWICE DAILY AT 9AM AND 5PM EVERY MORNING AND EVERY EVENING 180 tablet 0   empagliflozin  (JARDIANCE ) 25 MG TABS tablet TAKE ONE TABLET BY MOUTH BEFORE BREAKFAST 90 tablet 1   hydrALAZINE  (  APRESOLINE ) 100 MG tablet TAKE ONE TABLET (100 MG TOTAL) BY MOUTH THREE TIMES DAILY @ 9AM, 1PM & 5PM 270 tablet 0   insulin  glargine (LANTUS  SOLOSTAR) 100 UNIT/ML Solostar Pen INJECT 10 UNITS UNDER THE SKIN DAILY. (DISCARD PEN 28 DAYS AFTER OPENING) 30 mL 2   rosuvastatin  (CRESTOR ) 40 MG tablet TAKE ONE TABLET (40 MG TOTAL) BY MOUTH DAILY AT 5 PM 90 tablet 0   No facility-administered medications prior to visit.     ROS Review of Systems  Constitutional:  Negative for activity change and appetite change.  HENT:  Negative for sinus pressure and sore throat.   Respiratory:  Negative for chest tightness, shortness of breath and wheezing.   Cardiovascular:  Negative for chest pain and palpitations.  Gastrointestinal:  Negative for abdominal distention, abdominal pain and constipation.  Genitourinary: Negative.   Musculoskeletal: Negative.   Psychiatric/Behavioral:  Negative for behavioral problems and dysphoric mood.     Objective:  BP 127/81 (BP Location: Left Arm, Patient Position: Sitting, Cuff Size: Normal)   Pulse 75   Wt 142 lb 3.2 oz (64.5 kg)   SpO2 100%   BMI 22.27 kg/m      08/10/2023    8:38 AM 07/04/2023    9:35 AM 07/04/2023    9:23 AM  BP/Weight  Systolic BP 127 153 155  Diastolic BP 81 91 91  Wt. (Lbs) 142.2  142  BMI 22.27 kg/m2  22.24 kg/m2      Physical Exam Constitutional:      Appearance: He is well-developed.  Cardiovascular:     Rate and Rhythm: Normal rate.     Heart sounds: Normal heart sounds. No murmur heard. Pulmonary:     Effort: Pulmonary effort is normal.     Breath sounds: Normal breath sounds. No wheezing or rales.   Chest:     Chest wall: No tenderness.  Abdominal:     General: Bowel sounds are normal. There is no distension.     Palpations: Abdomen is soft. There is no mass.     Tenderness: There is no abdominal tenderness.  Musculoskeletal:        General: Normal range of motion.     Right lower leg: No edema.     Left lower leg: No edema.  Neurological:     Mental Status: He is alert and oriented to person, place, and time.     Motor: Weakness (RUE, RLE) present.  Psychiatric:        Mood and Affect: Mood normal.    Diabetic Foot Exam - Simple   Simple Foot Form Diabetic Foot exam was performed with the following findings: Yes 08/10/2023  8:59 AM  Visual Inspection No deformities, no ulcerations, no other skin breakdown bilaterally: Yes Sensation Testing Intact to touch and monofilament testing bilaterally: Yes Pulse Check See comments: Yes Comments Unable to palpate dorsalis pedis bilaterally. PAD on ABI bilaterally        Latest Ref Rng & Units 01/18/2023    9:18 AM 08/26/2022   10:43 AM 07/22/2022    8:32 AM  CMP  Glucose 70 - 99 mg/dL 849  872  895   BUN 6 - 24 mg/dL 8  6  7    Creatinine 0.76 - 1.27 mg/dL 9.02  9.04  9.03   Sodium 134 - 144 mmol/L 137  141  140   Potassium 3.5 - 5.2 mmol/L 4.4  4.4  4.1   Chloride 96 - 106 mmol/L 101  103  100   CO2 20 - 29 mmol/L 20  22  23    Calcium  8.7 - 10.2 mg/dL 89.9  9.7  89.9   Total Protein 6.0 - 8.5 g/dL 7.1  7.0  7.0   Total Bilirubin 0.0 - 1.2 mg/dL 0.4  0.4  0.4   Alkaline Phos 44 - 121 IU/L 119  98  90   AST 0 - 40 IU/L 37  35  61   ALT 0 - 44 IU/L 38  38  73     Lipid Panel     Component Value Date/Time   CHOL 119 07/22/2022 0832   TRIG 101 07/22/2022 0832   HDL 43 07/22/2022 0832   CHOLHDL 3.2 08/31/2021 0843   CHOLHDL 3.5 07/18/2014 0922   VLDL NOT CALC 07/18/2014 0922   LDLCALC 57 07/22/2022 0832    CBC    Component Value Date/Time   WBC 10.2 03/18/2019 1123   RBC 5.00 03/18/2019 1123   HGB 15.2  03/18/2019 1123   HCT 46.2 03/18/2019 1123   PLT 278 03/18/2019 1123   MCV 92.4 03/18/2019 1123   MCH 30.4 03/18/2019 1123   MCHC 32.9 03/18/2019 1123   RDW 12.8 03/18/2019 1123   LYMPHSABS 3.1 03/05/2019 0702   MONOABS 1.6 (H) 03/05/2019 0702   EOSABS 0.0 03/05/2019 0702   BASOSABS 0.0 03/05/2019 0702    Lab Results  Component Value Date   HGBA1C 6.2 08/10/2023    Assessment & Plan:      Type 2 Diabetes Mellitus Improved glycemic control with recent A1c of 6.2, down from 6.6. Patient reports dietary modifications including reduced rice intake and increased vegetable consumption. Currently on Jardiance  and Lantus  10 units daily. -Continue current regimen of Jardiance  and Lantus  10 units daily.  Smoking Cessation Patient reports reduced smoking to one cigarette per day. Currently on Wellbutrin  for smoking cessation. -Encourage continued reduction with goal of complete cessation. Continue Wellbutrin .  Hypertension Blood pressure well controlled. -Continue current antihypertensive regimen. -Counseled on blood pressure goal of less than 130/80, low-sodium, DASH diet, medication compliance, 150 minutes of moderate intensity exercise per week. Discussed medication compliance, adverse effects.   Diabetic Retinopathy Screening Last eye exam was one year ago. -Schedule annual eye exam.  Diabetic Nephropathy Screening No recent urine test. -Order urine test to assess kidney function.  Peripheral artery disease Nonpalpable bilateral dorsalis pedis and posterior tibialis -PAD on ABI machine Refer to vascular  Influenza Vaccination Patient due for annual flu shot. -Administer flu shot today.  General Health Maintenance / Followup Plans -Check blood tests today and call patient with results. -Schedule follow-up appointment in six months, or sooner if any issues arise.          Meds ordered this encounter  Medications   amLODipine  (NORVASC ) 10 MG tablet    Sig: TAKE  ONE TABLET BY MOUTH DAILY AT 9 AM EVERY MORNING FOR BLOOD PRESSURE    Dispense:  90 tablet    Refill:  0   baclofen  (LIORESAL ) 10 MG tablet    Sig: Take 1 tablet (10 mg total) by mouth 3 (three) times daily.    Dispense:  180 tablet    Refill:  1   buPROPion  (WELLBUTRIN  XL) 150 MG 24 hr tablet    Sig: Take 1 tablet (150 mg total) by mouth daily.    Dispense:  90 tablet    Refill:  1    This prescription was filled on 10/15/2021. Any refills authorized will be placed  on file.   carvedilol  (COREG ) 12.5 MG tablet    Sig: TAKE ONE TABLET BY MOUTH TWICE DAILY AT 9AM AND 5PM EVERY MORNING AND EVERY EVENING    Dispense:  180 tablet    Refill:  0   empagliflozin  (JARDIANCE ) 25 MG TABS tablet    Sig: TAKE ONE TABLET BY MOUTH BEFORE BREAKFAST    Dispense:  90 tablet    Refill:  1    This prescription was filled on 07/07/2022. Any refills authorized will be placed on file.   hydrALAZINE  (APRESOLINE ) 100 MG tablet    Sig: Take 1 tablet (100 mg total) by mouth 3 (three) times daily.    Dispense:  270 tablet    Refill:  1   insulin  glargine (LANTUS  SOLOSTAR) 100 UNIT/ML Solostar Pen    Sig: INJECT 10 UNITS UNDER THE SKIN DAILY. (DISCARD PEN 28 DAYS AFTER OPENING)    Dispense:  30 mL    Refill:  2   rosuvastatin  (CRESTOR ) 40 MG tablet    Sig: Take 1 tablet (40 mg total) by mouth daily.    Dispense:  90 tablet    Refill:  1    Follow-up: Return in about 6 months (around 02/07/2024) for Chronic medical conditions.       Corrina Sabin, MD, FAAFP. [facility] [place], [place] [phone]   08/10/2023, 9:58 AM

## 2023-08-10 NOTE — Patient Instructions (Signed)
Peripheral Vascular Disease  Peripheral vascular disease (PVD) is a disease of the blood vessels. PVD may also be called peripheral artery disease (PAD) or poor circulation. PVD is the blocking or hardening of the arteries anywhere within the circulatory system beyond the heart. This can result in a decreased supply of blood to the arms, legs, and internal organs, such as the stomach or kidneys. However, PVD most often affects a person's lower legs and feet. Without treatment, PVD often worsens. PVD can lead to acute limb ischemia. This occurs when an arm or leg suddenly has trouble getting enough blood. This is a medical emergency. What are the causes? The most common cause of PVD is atherosclerosis. This is a buildup of fatty material and other substances (plaque)inside your arteries. Pieces of plaque can break off from the walls of an artery and become stuck in a smaller artery, blocking blood flow and possibly causing acute limb ischemia. Other common causes of PVD include: Blood clots that form inside the blood vessels. Injuries to blood vessels. Diseases that cause inflammation of blood vessels or cause blood vessel tightening (spasms). What increases the risk? The following factors may make you more likely to develop this condition: A family history of PVD. Common medical conditions, including: High cholesterol. Diabetes. High blood pressure (hypertension). Heart disease. Known atherosclerotic disease in another area of the body. Past injury, such as burns or a broken bone. Other medical conditions, such as: Buerger's disease. This is caused by inflamed blood vessels in your hands and feet. Some forms of arthritis. Birth defects that affect the arteries in your legs. Kidney disease. Using tobacco and nicotine products. Not getting enough exercise. Obesity. Being age 71 or older, or being age 81 or older and having the other risk factors. What are the signs or symptoms? This  condition may cause different symptoms. Your symptoms depend on what body part is not getting enough blood. Common signs and symptoms include: Cramps in your buttocks, legs, and feet. Intermittent claudication. This is pain and weakness in your legs during activity that resolves with rest. Leg pain at rest and leg numbness, tingling, or weakness. Coldness in a leg or foot, especially when compared to the other leg or foot. Skin or hair changes. These can include: Hair loss. Shiny skin. Pale or bluish skin. Thick toenails. Inability to get or maintain an erection (erectile dysfunction). Tiredness (fatigue). Weak pulse or no pulse in the feet. People with PVD are more likely to develop open wounds (ulcers) and sores on their toes, feet, or legs. The ulcers or sores may take longer than normal to heal. How is this diagnosed? PVD is diagnosed based on your signs and symptoms, a physical exam, and your medical history. You may also have other tests to find the cause. Tests include: Ankle-brachial index test.This test compares the blood pressure readings of the legs and arms. This may also include an exercise ankle-brachial index test in which you walk on a treadmill to check your symptoms. Doppler ultrasound. This takes pictures of blood flow through your blood vessels. Imaging studies that use dye to show blood flow. These are: CT angiogram. Magnetic resonance angiogram, or MRA. How is this treated? Treatment for PVD depends on the cause of your condition, how severe your symptoms are, and your age. Underlying causes need to be treated and controlled. These include long-term (chronic) conditions, such as diabetes, high cholesterol, and hypertension. Treatment may include: Lifestyle changes, such as: Quitting tobacco use. Exercising regularly. Following a  low-fat, low-cholesterol diet. Not drinking alcohol. Taking medicines, such as: Blood thinners to prevent blood clots. Medicines to  improve blood flow. Medicines to improve cholesterol levels. Procedures, such as: Angioplasty. This uses an inflated balloon to open a blocked artery and improve blood flow. Stent implant. This inserts a small mesh tube to keep a blocked artery open. Peripheral bypass surgery. This reroutes blood flow around a blocked artery. Surgery to remove dead tissue from an infected wound (debridement). Amputation. This is surgical removal of the affected limb. It may be necessary in cases of acute limb ischemia when medical or surgical treatments have not helped. Follow these instructions at home: Medicines Take over-the-counter and prescription medicines only as told by your health care provider. If you are taking blood thinners: Talk with your health care provider before you take any medicines that contain aspirin or NSAIDs, such as ibuprofen. These medicines increase your risk for dangerous bleeding. Take your medicine exactly as told, at the same time every day. Avoid activities that could cause injury or bruising, and follow instructions about how to prevent falls. Wear a medical alert bracelet or carry a card that lists what medicines you take. Lifestyle     Exercise regularly. Ask your health care provider about some good activities for you. Talk with your health care provider about maintaining a healthy weight. If needed, ask about losing weight. Eat a diet that is low in fat and cholesterol. If you need help, talk with your health care provider. Do not drink alcohol. Do not use any products that contain nicotine or tobacco. These products include cigarettes, chewing tobacco, and vaping devices, such as e-cigarettes. If you need help quitting, ask your health care provider. General instructions Take good care of your feet. To do this: Wear comfortable shoes that fit well. Check your feet often for any cuts or sores. Get an annual influenza vaccine. Keep all follow-up visits. This is  important. Where to find more information Society for Vascular Surgery: vascular.org American Heart Association: heart.org National Heart, Lung, and Blood Institute: [employer] Contact a health care provider if: You have leg cramps while walking. You have leg pain when you rest. Your leg or foot feels cold. Your skin changes color. You have erectile dysfunction. You have cuts or sores on your legs or feet that do not heal. Get help right away if: You have sudden changes in color and feeling of your arms or legs, such as: Your arm or leg turns cold, numb, and blue. Your arm or leg becomes red, warm, swollen, painful, or numb. You have any symptoms of a stroke. "BE FAST" is an easy way to remember the main warning signs of a stroke: B - Balance. Signs are dizziness, sudden trouble walking, or loss of balance. E - Eyes. Signs are trouble seeing or a sudden change in vision. F - Face. Signs are sudden weakness or numbness of the face, or the face or eyelid drooping on one side. A - Arms. Signs are weakness or numbness in an arm. This happens suddenly and usually on one side of the body. S - Speech. Signs are sudden trouble speaking, slurred speech, or trouble understanding what people say. T - Time. Time to call emergency services. Write down what time symptoms started. You have other signs of a stroke, such as: A sudden, severe headache with no known cause. Nausea or vomiting. Seizure. You have chest pain or trouble breathing. These symptoms may represent a serious problem that is an emergency.  Do not wait to see if the symptoms will go away. Get medical help right away. Call your local emergency services (911 in the [place] Do not drive yourself to the hospital. Summary Peripheral vascular disease (PVD) is a disease of the blood vessels. PVD is the blocking or hardening of the arteries anywhere within the circulatory system beyond the heart. PVD may cause different symptoms. Your  symptoms depend on what part of your body is not getting enough blood. Treatment for PVD depends on what caused it, how severe your symptoms are, and your age. This information is not intended to replace advice given to you by your health care provider. Make sure you discuss any questions you have with your health care provider. Document Revised: 12/31/2019 Document Reviewed: 01/27/2020 Elsevier Patient Education  2024 [employer].

## 2023-08-11 ENCOUNTER — Other Ambulatory Visit: Payer: Self-pay | Admitting: Family Medicine

## 2023-08-11 ENCOUNTER — Ambulatory Visit: Payer: Medicare HMO | Admitting: Physical Medicine & Rehabilitation

## 2023-08-11 DIAGNOSIS — Z794 Long term (current) use of insulin: Secondary | ICD-10-CM | POA: Diagnosis not present

## 2023-08-11 DIAGNOSIS — E1169 Type 2 diabetes mellitus with other specified complication: Secondary | ICD-10-CM | POA: Diagnosis not present

## 2023-08-11 DIAGNOSIS — E1159 Type 2 diabetes mellitus with other circulatory complications: Secondary | ICD-10-CM

## 2023-08-11 LAB — CMP14+EGFR
ALT: 56 [IU]/L — ABNORMAL HIGH (ref 0–44)
AST: 80 [IU]/L — ABNORMAL HIGH (ref 0–40)
Albumin: 4.5 g/dL (ref 3.8–4.9)
Alkaline Phosphatase: 108 [IU]/L (ref 44–121)
BUN/Creatinine Ratio: 8 — ABNORMAL LOW (ref 10–24)
BUN: 7 mg/dL — ABNORMAL LOW (ref 8–27)
Bilirubin Total: 0.4 mg/dL (ref 0.0–1.2)
CO2: 21 mmol/L (ref 20–29)
Calcium: 9.9 mg/dL (ref 8.6–10.2)
Chloride: 102 mmol/L (ref 96–106)
Creatinine, Ser: 0.89 mg/dL (ref 0.76–1.27)
Globulin, Total: 2.6 g/dL (ref 1.5–4.5)
Glucose: 83 mg/dL (ref 70–99)
Potassium: 4.6 mmol/L (ref 3.5–5.2)
Sodium: 139 mmol/L (ref 134–144)
Total Protein: 7.1 g/dL (ref 6.0–8.5)
eGFR: 98 mL/min/{1.73_m2} (ref >59)

## 2023-08-13 LAB — MICROALBUMIN / CREATININE URINE RATIO
Creatinine, Urine: 75.5 mg/dL
Microalb/Creat Ratio: 7 mg/g{creat} (ref 0–29)
Microalbumin, Urine: 5.4 ug/mL

## 2023-08-14 ENCOUNTER — Other Ambulatory Visit: Payer: Self-pay | Admitting: Family Medicine

## 2023-08-14 DIAGNOSIS — I6930 Unspecified sequelae of cerebral infarction: Secondary | ICD-10-CM

## 2023-08-17 ENCOUNTER — Encounter: Payer: Self-pay | Admitting: Physical Medicine & Rehabilitation

## 2023-08-17 ENCOUNTER — Encounter: Payer: Medicare HMO | Attending: Physical Medicine & Rehabilitation | Admitting: Physical Medicine & Rehabilitation

## 2023-08-17 ENCOUNTER — Telehealth: Payer: Self-pay | Admitting: Family Medicine

## 2023-08-17 VITALS — BP 132/87 | HR 89 | Ht 67.0 in | Wt 144.0 lb

## 2023-08-17 DIAGNOSIS — G8111 Spastic hemiplegia affecting right dominant side: Secondary | ICD-10-CM | POA: Diagnosis not present

## 2023-08-17 NOTE — Telephone Encounter (Signed)
 error

## 2023-08-17 NOTE — Progress Notes (Signed)
 Subjective:    Patient ID: Casey Petty, male    DOB: 08/01/1961, 63 y.o.   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  Overall feels like he did fairly well after left botulinum toxin injection approximately 6 weeks ago.  He has had no falls.  Is modified independent for all self-care and mobility although his wife does help him with meals and more complex ADLs.  RIGHT  Biceps:            200     Brachialis 200U  Brachioradialis 100 FCR                 200                   FDS                 200 FDP                 200 Lumbricals  100    Hamstrings 300U Pain Inventory Average Pain 0 Pain Right Now 0 My pain is  No Pain stroke follow up   In the last 24 hours, has pain interfered with the following? General activity 0 Relation with others 0 Enjoyment of life 0  Sleep (in general) Good    Family History  Problem Relation Age of Onset   Heart attack Father    Cancer Neg Hx    Colon polyps Neg Hx    Colon cancer Neg Hx    Esophageal cancer Neg Hx    Stomach cancer Neg Hx    Rectal cancer Neg Hx    Social History   Socioeconomic History   Marital status: Single    Spouse name: Not on file   Number of children: Not on file   Years of education: Not on file   Highest education level: Not on file  Occupational History   Not on file  Tobacco Use   Smoking status: Every Day    Current packs/day: 0.25    Types: Cigarettes   Smokeless tobacco: Never   Tobacco comments:    Smoking 4 cigs/day  Vaping Use   Vaping status: Never Used  Substance and Sexual Activity   Alcohol use: Yes    Comment: 2 times a week - beer   Drug use: No   Sexual activity: Never  Other Topics Concern   Not on file  Social History Narrative   Not on file   Social Drivers of Health   Financial Resource Strain: Low Risk  (08/10/2023)   Overall Financial Resource Strain (CARDIA)    Difficulty of Paying Living Expenses:  Not hard at all  Food Insecurity: No Food Insecurity (08/10/2023)   Hunger Vital Sign    Worried About Running Out of Food in the Last Year: Never true    Ran Out of Food in the Last Year: Never true  Transportation Needs: No Transportation Needs (08/10/2023)   PRAPARE - Administrator, Civil Service (Medical): No    Lack of Transportation (Non-Medical): No  Physical Activity: Insufficiently Active (01/04/2023)   Exercise Vital Sign    Days of Exercise per Week: 3 days    Minutes of Exercise per Session: 30 min  Stress: No Stress Concern Present (08/10/2023)   [employer] of Occupational Health - Occupational Stress Questionnaire    Feeling of Stress : Not at all  Social Connections:  Socially Integrated (08/10/2023)   Social Connection and Isolation Panel [NHANES]    Frequency of Communication with Friends and Family: More than three times a week    Frequency of Social Gatherings with Friends and Family: More than three times a week    Attends Religious Services: More than 4 times per year    Active Member of [employer] or Organizations: Yes    Attends Banker Meetings: 1 to 4 times per year    Marital Status: Married   Past Surgical History:  Procedure Laterality Date   WISDOM TOOTH EXTRACTION Bilateral 06/2021   Past Surgical History:  Procedure Laterality Date   WISDOM TOOTH EXTRACTION Bilateral 06/2021   Past Medical History:  Diagnosis Date   Diabetes mellitus without complication (HCC)    on meds   Hyperlipidemia    on meds   Hypertension    on meds   Stroke (HCC) 2020   R hemiparesis-abnormal gait   There were no vitals taken for this visit.  Opioid Risk Score:   Fall Risk Score:  `1  Depression screen [facility] 2/9     08/10/2023    8:40 AM 07/04/2023    9:00 AM 02/16/2023    9:28 AM 01/18/2023    8:48 AM 01/04/2023    9:33 AM 12/29/2022    9:11 AM 11/10/2022    9:25 AM  Depression screen PHQ 2/9  Decreased Interest 0 0 0 3 0 0 0  Down,  Depressed, Hopeless 0 0 0 3 0 0 0  PHQ - 2 Score 0 0 0 6 0 0 0  Altered sleeping    3     Tired, decreased energy    3     Change in appetite    0     Feeling bad or failure about yourself     3     Trouble concentrating    3     Moving slowly or fidgety/restless    3     Suicidal thoughts    3     PHQ-9 Score    24         Review of Systems     Objective:   Physical Exam   General No acute distress Mood and affect appropriate Tone MAS 2 at the right elbow flexor MAS 3 at the finger flexor MAS 2 at the wrist flexor MAS 1 at the knee flexors Ambulates without assistive device for short distances but uses a cane for longer distances.  No evidence of toe drag or knee instability he also uses right AFO and does not exhibit any ankle instability. Speech with mild dysarthria no evidence of fascia Motor strength is 3 - at the right deltoid bicep tricep finger flexors trace finger and wrist extension Right lower extremity 0 at the ankle 4 - at the hip flexor knee extensor    Assessment & Plan:   1.  History of right spastic hemiplegia secondary to remote left CVA standpoint.  Continue with his current botulinum toxin dosing for right upper extremity and right lower extremity.  Weeks to repeat procedure

## 2023-08-24 ENCOUNTER — Other Ambulatory Visit: Payer: Self-pay | Admitting: Family Medicine

## 2023-08-24 ENCOUNTER — Other Ambulatory Visit: Payer: Self-pay

## 2023-08-24 DIAGNOSIS — I739 Peripheral vascular disease, unspecified: Secondary | ICD-10-CM

## 2023-08-24 NOTE — Telephone Encounter (Signed)
Medication Refill -  Most Recent Primary Care Visit:  Provider: Hoy Register  Department: [facility]  Visit Type: OFFICE VISIT  Date: 08/10/2023  Medication: COMFORT EZ PEN NEEDLES 32G X 4 MM MISC   Has the patient contacted their pharmacy? Yes   Is this the correct pharmacy for this prescription? Yes If no, delete pharmacy and type the correct one.  This is the patient's preferred pharmacy:   [facility] [place], [place] - [phone] [address] Phone: [phone]  Fax: [phone]      Has the prescription been filled recently? No  Is the patient out of the medication? No  Has the patient been seen for an appointment in the last year OR does the patient have an upcoming appointment? Yes  Can we respond through MyChart? No  Please asst patient further and if cannot get hold of Connye Burkitt please call the pharmacy at [phone]

## 2023-08-25 ENCOUNTER — Ambulatory Visit: Payer: Medicare HMO | Admitting: Podiatry

## 2023-08-25 MED ORDER — COMFORT EZ PEN NEEDLES 32G X 4 MM MISC: 2 refills | Status: DC

## 2023-08-25 NOTE — Telephone Encounter (Signed)
Requested medication (s) are due for refill today: yes  Requested medication (s) are on the active medication list: yes  Last refill:  08/09/22  Future visit scheduled: yes  Notes to clinic:  routing for sig instructions     Requested Prescriptions  Pending Prescriptions Disp Refills   Insulin Pen Needle (COMFORT EZ PEN NEEDLES) 32G X 4 MM MISC 100 each 2     Endocrinology: Diabetes - Testing Supplies Passed - 08/25/2023  8:28 AM      Passed - Valid encounter within last 12 months    Recent Outpatient Visits           2 weeks ago Hypertension associated with diabetes [facility])   [facility] Dept Of [place]. [facility] Hoy Register, MD   3 months ago History of CVA with residual deficit   [facility] [place] - A Dept Of [place]. [facility] Hoy Register, MD   7 months ago Type 2 diabetes mellitus with other specified complication, with long-term current use of insulin (HCC)   [place] [facility] [place] - A Dept Of [place]. [facility] Hoy Register, MD   1 year ago Type 2 diabetes mellitus with other specified complication, with long-term current use of insulin (HCC)   [place] [facility] [place] - A Dept Of [place]. [facility] Hoy Register, MD   1 year ago Type 2 diabetes mellitus with other specified complication, with long-term current use of insulin (HCC)   [place] [facility] [place] - A Dept Of [place]. [facility] Hoy Register, MD       Future Appointments             In 5 months Hoy Register, MD [facility] - A Dept Of [place]. [facility]

## 2023-08-28 NOTE — Progress Notes (Unsigned)
Patient ID: Casey Petty, male   DOB: 1960-08-12, 63 y.o.   MRN: [id number]  Reason for Consult: No chief complaint on file.   Referred by Hoy Register, MD  Subjective:     HPI  Casey Petty is a 63 y.o. male with multiple significant medical comorbidities including type 2 diabetes on insulin, and previous stroke with right-sided spastic hemiparesis.  He is referred for evaluation of PAD. ***  Past Medical History:  Diagnosis Date   Diabetes mellitus without complication (HCC)    on meds   Hyperlipidemia    on meds   Hypertension    on meds   Stroke (HCC) 2020   R hemiparesis-abnormal gait   Family History  Problem Relation Age of Onset   Heart attack Father    Cancer Neg Hx    Colon polyps Neg Hx    Colon cancer Neg Hx    Esophageal cancer Neg Hx    Stomach cancer Neg Hx    Rectal cancer Neg Hx    Past Surgical History:  Procedure Laterality Date   WISDOM TOOTH EXTRACTION Bilateral 06/2021    Short Social History:  Social History   Tobacco Use   Smoking status: Every Day    Current packs/day: 0.25    Types: Cigarettes   Smokeless tobacco: Never   Tobacco comments:    Smoking 4 cigs/day  Substance Use Topics   Alcohol use: Yes    Comment: 2 times a week - beer    Allergies  Allergen Reactions   Lisinopril Swelling    Angioedema- lips/face    Current Outpatient Medications  Medication Sig Dispense Refill   amLODipine (NORVASC) 10 MG tablet TAKE ONE TABLET BY MOUTH DAILY AT 9 AM EVERY MORNING FOR BLOOD PRESSURE 90 tablet 0   aspirin EC 81 MG tablet Take 1 tablet (81 mg total) by mouth daily. 100 tablet 0   baclofen (LIORESAL) 10 MG tablet TAKE TWO TABLETS BY MOUTH THREE TIMES DAILY AT 9AM, 1PM AND 5PM 180 tablet 2   Blood Glucose Monitoring Suppl (ACCU-CHEK GUIDE) w/Device KIT 1 each by Does not apply route in the morning, at noon, in the evening, and at bedtime. 1 kit 0   Blood Glucose Monitoring Suppl (TRUE METRIX METER) w/Device KIT Check  blood sugar fasting and before meals and again if pt feels bad (symptoms of hypo). 1 kit 0   buPROPion (WELLBUTRIN XL) 150 MG 24 hr tablet Take 1 tablet (150 mg total) by mouth daily. 90 tablet 1   carvedilol (COREG) 12.5 MG tablet TAKE ONE TABLET BY MOUTH TWICE DAILY AT 9AM AND 5PM EVERY MORNING AND EVERY EVENING 180 tablet 0   Continuous Blood Gluc Receiver (DEXCOM G6 RECEIVER) DEVI Use to check blood sugar TID. 1 each 0   Continuous Blood Gluc Sensor (DEXCOM G6 SENSOR) MISC Use to check blood sugar TID. 3 each 3   Continuous Blood Gluc Transmit (DEXCOM G6 TRANSMITTER) MISC Use to check blood sugar TID. 1 each 3   empagliflozin (JARDIANCE) 25 MG TABS tablet TAKE ONE TABLET BY MOUTH BEFORE BREAKFAST 90 tablet 1   ezetimibe (ZETIA) 10 MG tablet TAKE ONE TABLET 10mg  BY MOUTH DAILY AT 5 PM EVERY EVENING 90 tablet 0   glucose blood (ACCU-CHEK GUIDE) test strip Use as instructed up to 4 times daily 100 each 12   glucose blood (TRUE METRIX BLOOD GLUCOSE TEST) test strip Use as instructed before every meal nightly up to 4 times daily 100 each  12   hydrALAZINE (APRESOLINE) 100 MG tablet Take 1 tablet (100 mg total) by mouth 3 (three) times daily. 270 tablet 1   insulin glargine (LANTUS SOLOSTAR) 100 UNIT/ML Solostar Pen INJECT 10 UNITS UNDER THE SKIN DAILY. (DISCARD PEN 28 DAYS AFTER OPENING) 30 mL 2   Insulin Pen Needle (COMFORT EZ PEN NEEDLES) 32G X 4 MM MISC Use to inject insulin once daily. 100 each 2   Misc. Devices MISC Wheelchair with Accessories: elevating leg rests (ELRs), wheel locks, extensions and anti-tippers. Cane or walker will not suffice 1 each 0   Misc. Devices MISC Rollator walker with seat.  Diagnosis - Stroke 1 each 0   rosuvastatin (CRESTOR) 40 MG tablet Take 1 tablet (40 mg total) by mouth daily. 90 tablet 1   TRUEplus Lancets 28G MISC Check blood sugar before every meal, nightly up to 4 times a day. 100 each 12   UREA 10 HYDRATING 10 % cream APPLY TO AFFECTED AREA EVERY DAY AS  NEEDED 85 g 0   No current facility-administered medications for this visit.    REVIEW OF SYSTEMS  Positive for ***  All other systems were reviewed and are negative     Objective:  Objective   There were no vitals filed for this visit. There is no height or weight on file to calculate BMI.  Physical Exam General: no acute distress Cardiac: hemodynamically stable Pulm: normal work of breathing Abdomen: non-tender, no pulsatile mass*** Neuro: alert, no focal deficit Extremities: no edema, cyanosis or wounds*** Vascular:   Right: ***  Left: ***   Data: ABI ***  Most recent CMP reviewed, creatinine 0.89  Lipid panel reviewed     Assessment/Plan:     Casey Petty is a 63 y.o. male ***    Recommendations to optimize cardiovascular risk: Abstinence from all tobacco products. Blood glucose control with goal A1c < 7%. Blood pressure control with goal blood pressure < 140/90 mmHg. Lipid reduction therapy with goal LDL-C <100 mg/dL  Aspirin 81mg  PO QD.  Atorvastatin 40-80mg  PO QD (or other "high intensity" statin therapy).     Casey Pastures MD [facility]

## 2023-08-29 ENCOUNTER — Ambulatory Visit (HOSPITAL_COMMUNITY)
Admission: RE | Admit: 2023-08-29 | Discharge: 2023-08-29 | Disposition: A | Discharge status: Home / Self Care | Payer: Medicare HMO | Source: Ambulatory Visit | Attending: Vascular Surgery | Admitting: Vascular Surgery

## 2023-08-29 ENCOUNTER — Ambulatory Visit (INDEPENDENT_AMBULATORY_CARE_PROVIDER_SITE_OTHER): Payer: Medicare HMO | Admitting: Vascular Surgery

## 2023-08-29 ENCOUNTER — Encounter: Payer: Self-pay | Admitting: Vascular Surgery

## 2023-08-29 VITALS — BP 176/106 | HR 89 | Temp 98.0°F | Resp 20 | Ht 67.0 in | Wt 146.0 lb

## 2023-08-29 DIAGNOSIS — I739 Peripheral vascular disease, unspecified: Secondary | ICD-10-CM | POA: Diagnosis not present

## 2023-08-29 LAB — VAS US ABI WITH/WO TBI
Left ABI: 1.08
Right ABI: 1.07

## 2023-09-01 ENCOUNTER — Ambulatory Visit (INDEPENDENT_AMBULATORY_CARE_PROVIDER_SITE_OTHER): Payer: Medicare HMO | Admitting: Podiatry

## 2023-09-01 ENCOUNTER — Encounter: Payer: Self-pay | Admitting: Podiatry

## 2023-09-01 DIAGNOSIS — E1165 Type 2 diabetes mellitus with hyperglycemia: Secondary | ICD-10-CM | POA: Diagnosis not present

## 2023-09-01 DIAGNOSIS — M79674 Pain in right toe(s): Secondary | ICD-10-CM

## 2023-09-01 DIAGNOSIS — B351 Tinea unguium: Secondary | ICD-10-CM

## 2023-09-01 DIAGNOSIS — M79675 Pain in left toe(s): Secondary | ICD-10-CM

## 2023-09-01 NOTE — Progress Notes (Unsigned)
  Subjective:  Patient ID: Casey Petty, male    DOB: 26-Aug-1960,  MRN: [id number]  Chief Complaint  Patient presents with   [facility]    Last A1c: 9.3. no anticoag. No corns/callus.     63 y.o. male presents with the above complaint. History confirmed with patient. Patient presenting with pain related to dystrophic thickened elongated nails. Patient is unable to trim own nails related to nail dystrophy and/or mobility issues. Patient does have a history of T2DM.  Last A1c 9.3. Patient does not have callus present.  Does have PAD.  Seen by Dr. Hetty Blend, no intervention at this time, discussed need for smoking cessation and use of aspirin.  Does ambulate with cane.  History of stroke affecting right side.  Objective:  Physical Exam: warm to cool, good capillary refill. Dry atrophic pedal skin nail exam onychomycosis of the toenails, onycholysis, and dystrophic nails DP pulses palpable, PT pulses faintly palpable, and protective sensation intact Left Foot:  Pain with palpation of nails due to elongation and dystrophic growth.  Right Foot: Pain with palpation of nails due to elongation and dystrophic growth.  Decreased muscle strength right lower extremity, 3/5 for major muscle groups.  Ambulates with cane and AFO assistance favoring right side.  Assessment:   1. Uncontrolled type 2 diabetes mellitus with hyperglycemia (HCC)   2. Pain due to onychomycosis of toenails of both feet      Plan:  Patient was evaluated and treated and all questions answered.  #Onychomycosis with pain  -Nails palliatively debrided as below. -Educated on self-care  Procedure: Nail Debridement Rationale: Pain Type of Debridement: manual, sharp debridement. Instrumentation: Nail nipper, rotary burr. Number of Nails: 10  Patient educated on diabetes. Discussed proper diabetic foot care and discussed risks and complications of disease. Educated patient in depth on reasons to return to the office immediately should  he/she discover anything concerning or new on the feet. All questions answered. Discussed proper shoes as well.    Return in about 3 months (around 11/30/2023) for Diabetic Foot Care.         Bronwen Betters, DPM [facility]

## 2023-10-05 ENCOUNTER — Ambulatory Visit: Payer: Medicare HMO | Admitting: Physical Medicine & Rehabilitation

## 2023-10-06 ENCOUNTER — Other Ambulatory Visit: Payer: Self-pay | Admitting: Family Medicine

## 2023-10-06 DIAGNOSIS — E1159 Type 2 diabetes mellitus with other circulatory complications: Secondary | ICD-10-CM

## 2023-10-10 ENCOUNTER — Encounter: Payer: Medicare HMO | Attending: Physical Medicine & Rehabilitation | Admitting: Physical Medicine & Rehabilitation

## 2023-10-10 ENCOUNTER — Encounter: Payer: Self-pay | Admitting: Physical Medicine & Rehabilitation

## 2023-10-10 VITALS — BP 152/94 | HR 97 | Ht 67.0 in | Wt 143.0 lb

## 2023-10-10 DIAGNOSIS — G8111 Spastic hemiplegia affecting right dominant side: Secondary | ICD-10-CM | POA: Diagnosis not present

## 2023-10-10 MED ORDER — SODIUM CHLORIDE (PF) 0.9 % IJ SOLN
7.5000 mL | Freq: Once | INTRAMUSCULAR | Status: AC
  Administered 2023-10-10: 7.5 mL via INTRAVENOUS

## 2023-10-10 MED ORDER — ABOBOTULINUMTOXINA 500 UNITS IM SOLR
1500.0000 [IU] | Freq: Once | INTRAMUSCULAR | Status: AC
Start: 2023-10-10 — End: 2023-10-11
  Administered 2023-10-11: 1500 [IU] via INTRAMUSCULAR

## 2023-10-10 NOTE — Patient Instructions (Signed)
You received a Dysport injection today. You may experience muscle pains and aches. He may apply ice 20 minutes every 2 hours as needed for the next 24-48 hours. He also noticed bleeding or bruising in the areas that were injected. May apply Band-Aid. If this bruising is extensive, please notify our office. If there is evidence of increasing redness that occurs 2-3 days after injection. Please call our office. This could be a sign of infection. It is very rare, however. You may experience some muscle weakness in the muscles and injected. This would likely start in about one week.  

## 2023-10-10 NOTE — Progress Notes (Signed)
 Dysport Injection for spasticity using needle EMG guidance  Pt has been responding well to botulinum toxin injections for stroke related RIght spastic hemiparesis  Dilution: 200 Units/ml Indication: Severe spasticity which interferes with ADL,mobility and/or  hygiene and is unresponsive to medication management and other conservative care Informed consent was obtained after describing risks and benefits of the procedure with the patient. This includes bleeding, bruising, infection, excessive weakness, or medication side effects. A REMS form is on file and signed. Needle:  needle electrode Number of units per muscle  RIGHT  Biceps:            200     Brachialis 200U  Brachioradialis 100 FCR                 200                   FDS                 200 FDP                 200 Lumbricals  100    Hamstrings 300U All injections were done after obtaining appropriate EMG activity and after negative drawback for blood. The patient tolerated the procedure well. Post procedure instructions were given.

## 2023-10-12 ENCOUNTER — Other Ambulatory Visit: Payer: Self-pay | Admitting: Family Medicine

## 2023-10-12 DIAGNOSIS — E1159 Type 2 diabetes mellitus with other circulatory complications: Secondary | ICD-10-CM

## 2023-10-12 MED ORDER — CARVEDILOL 12.5 MG PO TABS: ORAL_TABLET | ORAL | 1 refills | Status: DC

## 2023-10-12 MED ORDER — AMLODIPINE BESYLATE 10 MG PO TABS: ORAL_TABLET | ORAL | 1 refills | Status: DC

## 2023-10-12 NOTE — Telephone Encounter (Signed)
 Copied from CRM [id number]. Topic: Clinical - Medication Refill >> Oct 12, 2023 11:22 AM Ivette P wrote: Most Recent Primary Care Visit:  Provider: Hoy Register  Department: [facility]  Visit Type: OFFICE VISIT  Date: 08/10/2023  Medication: amLODipine (NORVASC) 10 MG tablet carvedilol (COREG) 12.5 MG tablet   Has the patient contacted their pharmacy? Yes (Agent: If no, request that the patient contact the pharmacy for the refill. If patient does not wish to contact the pharmacy document the reason why and proceed with request.) (Agent: If yes, when and what did the pharmacy advise?)  Is this the correct pharmacy for this prescription? Yes If no, delete pharmacy and type the correct one.  This is the patient's preferred pharmacy:  [facility] - [place], [place] - [address] [place] [address] Phone: [phone] Fax: [phone]   Has the prescription been filled recently? Yes  Is the patient out of the medication? No  Has the patient been seen for an appointment in the last year OR does the patient have an upcoming appointment? Yes  Can we respond through MyChart? No  Agent: Please be advised that Rx refills may take up to 3 business days. We ask that you follow-up with your pharmacy. >> Oct 12, 2023 11:24 AM Bobbye Morton wrote: Irving Burton from [facility] called in for this prescription refill callback number [phone]

## 2023-10-12 NOTE — Telephone Encounter (Signed)
 Copied from CRM [id number]. Topic: Clinical - Medication Refill >> Oct 12, 2023 11:22 AM Ivette P wrote: Most Recent Primary Care Visit:  Provider: Hoy Register  Department[facility]  Visit Type: OFFICE VISIT  Date: 08/10/2023  Medication: amLODipine (NORVASC) 10 MG tablet carvedilol (COREG) 12.5 MG tablet   Has the patient contacted their pharmacy? Yes (Agent: If no, request that the patient contact the pharmacy for the refill. If patient does not wish to contact the pharmacy document the reason why and proceed with request.) (Agent: If yes, when and what did the pharmacy advise?)  Is this the correct pharmacy for this prescription? Yes If no, delete pharmacy and type the correct one.  This is the patient's preferred pharmacy:  [facility] - [place], [place] - [address] [place] [address] Phone: [phone] Fax: [phone]   Has the prescription been filled recently? Yes  Is the patient out of the medication? No  Has the patient been seen for an appointment in the last year OR does the patient have an upcoming appointment? Yes  Can we respond through MyChart? No  Agent: Please be advised that Rx refills may take up to 3 business days. We ask that you follow-up with your pharmacy.

## 2023-10-12 NOTE — Telephone Encounter (Signed)
 Last Fill: Amlodipine: 10/06/23     Carvedilol: 10/06/23  Rcvd by pharmacy, confirmed  No further action needed.

## 2023-11-07 ENCOUNTER — Other Ambulatory Visit: Payer: Self-pay | Admitting: Family Medicine

## 2023-11-09 ENCOUNTER — Other Ambulatory Visit: Payer: Self-pay | Admitting: Family Medicine

## 2023-11-09 DIAGNOSIS — I6930 Unspecified sequelae of cerebral infarction: Secondary | ICD-10-CM

## 2023-11-16 ENCOUNTER — Other Ambulatory Visit: Payer: Self-pay | Admitting: Family Medicine

## 2023-11-16 DIAGNOSIS — I6930 Unspecified sequelae of cerebral infarction: Secondary | ICD-10-CM

## 2023-11-16 NOTE — Telephone Encounter (Unsigned)
 Copied from CRM [id number]. Topic: Clinical - Medication Refill >> Nov 16, 2023  5:40 PM Emylou G wrote: Most Recent Primary Care Visit:  Provider: Hoy Register  Department[facility]  Visit Type: OFFICE VISIT  Date: 08/10/2023  Medication: ezetimibe (ZETIA) 10 MG tablet  Has the patient contacted their pharmacy? Yes (Agent: If no, request that the patient contact the pharmacy for the refill. If patient does not wish to contact the pharmacy document the reason why and proceed with request.) (Agent: If yes, when and what did the pharmacy advise?) called [place]  Is this the correct pharmacy for this prescription? Yes If no, delete pharmacy and type the correct one.  This is the patient's preferred pharmacy:    [facility] - [place], [place] - [address] [place] [address] Phone: [phone] Fax: [phone]   Has the prescription been filled recently? unsure  Is the patient out of the medication? no  Has the patient been seen for an appointment in the last year OR does the patient have an upcoming appointment? Yes  Can we respond through MyChart? No  Agent: Please be advised that Rx refills may take up to 3 business days. We ask that you follow-up with your pharmacy.

## 2023-11-17 NOTE — Telephone Encounter (Signed)
 Rx refilled 11/09/2023 #[phone] rf. Receipt confirmed by same pharmacy. Pharmacy needs to be called.

## 2023-11-17 NOTE — Telephone Encounter (Signed)
 Call to pharmacy - they do have medication on file- too soon to refill- may refill 12/10/23 Requested Prescriptions  Pending Prescriptions Disp Refills   ezetimibe (ZETIA) 10 MG tablet 90 tablet 2     Cardiovascular:  Antilipid - Sterol Transport Inhibitors Failed - 11/17/2023 12:37 PM      Failed - AST in normal range and within 360 days    AST  Date Value Ref Range Status  08/10/2023 80 (H) 0 - 40 IU/L Final         Failed - ALT in normal range and within 360 days    ALT  Date Value Ref Range Status  08/10/2023 56 (H) 0 - 44 IU/L Final         Failed - Lipid Panel in normal range within the last 12 months    Cholesterol, Total  Date Value Ref Range Status  07/22/2022 119 100 - 199 mg/dL Final   LDL Chol Calc (NIH)  Date Value Ref Range Status  07/22/2022 57 0 - 99 mg/dL Final   HDL  Date Value Ref Range Status  07/22/2022 43 >39 mg/dL Final   Triglycerides  Date Value Ref Range Status  07/22/2022 101 0 - 149 mg/dL Final         Passed - Patient is not pregnant      Passed - Valid encounter within last 12 months    Recent Outpatient Visits           3 months ago Hypertension associated with diabetes (HCC)   [place] [facility] Dept Of [place]. [facility] Hoy Register, MD   5 months ago History of CVA with residual deficit   [place] [facility] [place] - A Dept Of [place]. [facility] Hoy Register, MD   10 months ago Type 2 diabetes mellitus with other specified complication, with long-term current use of insulin (HCC)   [place] [facility] [place] - A Dept Of [place]. [facility] Hoy Register, MD   1 year ago Type 2 diabetes mellitus with other specified complication, with long-term current use of insulin (HCC)   [place] [facility] [place] - A Dept Of [place]. [facility] Hoy Register, MD   1 year ago Type 2 diabetes mellitus with other specified complication, with long-term current  use of insulin (HCC)   [place] [facility] [place] - A Dept Of [place]. [facility] Hoy Register, MD       Future Appointments             In 2 months Hoy Register, MD [facility] - A Dept Of . [facility]

## 2023-12-01 ENCOUNTER — Encounter: Payer: Self-pay | Admitting: Podiatry

## 2023-12-01 ENCOUNTER — Ambulatory Visit (INDEPENDENT_AMBULATORY_CARE_PROVIDER_SITE_OTHER): Payer: Medicare HMO | Admitting: Podiatry

## 2023-12-01 DIAGNOSIS — E1165 Type 2 diabetes mellitus with hyperglycemia: Secondary | ICD-10-CM | POA: Diagnosis not present

## 2023-12-01 DIAGNOSIS — B351 Tinea unguium: Secondary | ICD-10-CM

## 2023-12-01 DIAGNOSIS — M79675 Pain in left toe(s): Secondary | ICD-10-CM

## 2023-12-01 DIAGNOSIS — M79674 Pain in right toe(s): Secondary | ICD-10-CM

## 2023-12-01 NOTE — Progress Notes (Signed)
 This patient returns to my office for at risk foot care.  This patient requires this care by a professional since this patient will be at risk due to having diabetes.  This patient is unable to cut nails himself since the patient cannot reach his nails.These nails are painful walking and wearing shoes. He presents with caregiver.  He wears braces for his feet due to CVA.This patient presents for at risk foot care today.  General Appearance  Alert, conversant and in no acute stress.  Vascular  Dorsalis pedis and posterior tibial  pulses are palpable  bilaterally.  Capillary return is within normal limits  bilaterally. Temperature is within normal limits  bilaterally.  Neurologic  Senn-Weinstein monofilament wire test within normal limits  bilaterally. Muscle power within normal limits bilaterally.  Nails Thick disfigured discolored nails with subungual debris  from hallux to fifth toes bilaterally. No evidence of bacterial infection or drainage bilaterally.  Orthopedic  No limitations of motion  feet .  No crepitus or effusions noted.  No bony pathology or digital deformities noted.  Skin  normotropic skin with no porokeratosis noted bilaterally.  No signs of infections or ulcers noted.     Onychomycosis  Pain in right toes  Pain in left toes  Consent was obtained for treatment procedures.   Mechanical debridement of nails 1-5  bilaterally performed with a nail nipper.  Filed with dremel without incident.    Return office visit    3 months                  Told patient to return for periodic foot care and evaluation due to potential at risk complications.   Ruffin Cotton DPM

## 2024-01-11 ENCOUNTER — Encounter: Payer: Self-pay | Admitting: Physical Medicine & Rehabilitation

## 2024-01-11 ENCOUNTER — Encounter: Attending: Physical Medicine & Rehabilitation | Admitting: Physical Medicine & Rehabilitation

## 2024-01-11 VITALS — BP 101/62 | HR 90 | Ht 67.0 in | Wt 144.0 lb

## 2024-01-11 DIAGNOSIS — G8111 Spastic hemiplegia affecting right dominant side: Secondary | ICD-10-CM | POA: Diagnosis not present

## 2024-01-11 MED ORDER — SODIUM CHLORIDE (PF) 0.9 % IJ SOLN
7.5000 mL | Freq: Once | INTRAMUSCULAR | Status: AC
  Administered 2024-01-11: 7.5 mL

## 2024-01-11 MED ORDER — ABOBOTULINUMTOXINA 300 UNITS IM SOLR
1500.0000 [IU] | Freq: Once | INTRAMUSCULAR | Status: AC
  Administered 2024-01-11: 1500 [IU] via INTRAMUSCULAR

## 2024-01-11 NOTE — Patient Instructions (Signed)
You received a Dysport injection today. You may experience muscle pains and aches. He may apply ice 20 minutes every 2 hours as needed for the next 24-48 hours. He also noticed bleeding or bruising in the areas that were injected. May apply Band-Aid. If this bruising is extensive, please notify our office. If there is evidence of increasing redness that occurs 2-3 days after injection. Please call our office. This could be a sign of infection. It is very rare, however. You may experience some muscle weakness in the muscles and injected. This would likely start in about one week.  

## 2024-01-11 NOTE — Progress Notes (Signed)
 Dysport Injection for spasticity using needle EMG guidance  Pt has been responding well to botulinum toxin injections for stroke related RIght spastic hemiparesis  Dilution: 200 Units/ml Indication: Severe spasticity which interferes with ADL,mobility and/or  hygiene and is unresponsive to medication management and other conservative care Informed consent was obtained after describing risks and benefits of the procedure with the patient. This includes bleeding, bruising, infection, excessive weakness, or medication side effects. A REMS form is on file and signed. Needle:  needle electrode Number of units per muscle  RIGHT  Biceps:            200     Brachialis 200U  Brachioradialis 100 FCR                 200                   FDS                 200 FDP                 200 Lumbricals  100    Hamstrings 300U All injections were done after obtaining appropriate EMG activity and after negative drawback for blood. The patient tolerated the procedure well. Post procedure instructions were given.

## 2024-01-16 ENCOUNTER — Ambulatory Visit: Attending: Family Medicine

## 2024-01-16 VITALS — Ht 67.0 in | Wt 149.0 lb

## 2024-01-16 DIAGNOSIS — E119 Type 2 diabetes mellitus without complications: Secondary | ICD-10-CM

## 2024-01-16 DIAGNOSIS — Z0000 Encounter for general adult medical examination without abnormal findings: Secondary | ICD-10-CM

## 2024-01-16 NOTE — Patient Instructions (Addendum)
 Mr. Jenny , Thank you for taking time out of your busy schedule to complete your Annual Wellness Visit with me. I enjoyed our conversation and look forward to speaking with you again next year. I, as well as your care team,  appreciate your ongoing commitment to your health goals. Please review the following plan we discussed and let me know if I can assist you in the future. Your Game plan/ To Do List    Referrals: If you haven't heard from the office you've been referred to, please reach out to them at the phone provided.   [facility] [address] [place], [place] [address] [phone]  Follow up Visits: Next Medicare AWV with our clinical staff: 01/21/2025 at 4:50p Phone Visit with Nurse Health Advisor   Have you seen your provider in the last 6 months (3 months if uncontrolled diabetes)? Yes Next Office Visit with your provider: 02/07/2024 at 8:30a Office visit with Dr. Adan Holms  Clinician Recommendations:  Aim for 30 minutes of exercise or brisk walking, 6-8 glasses of water, and 5 servings of fruits and vegetables each day.       This is a list of the screening recommended for you and due dates:  Health Maintenance  Topic Date Due   DTaP/Tdap/Td vaccine (1 - Tdap) Never done   Zoster (Shingles) Vaccine (1 of 2) Never done   Pneumococcal Vaccination (2 of 2 - PCV) 05/07/2020   Eye exam for diabetics  11/18/2021   COVID-19 Vaccine (1 - 2024-25 season) Never done   Hemoglobin A1C  02/07/2024   Flu Shot  03/08/2024   Yearly kidney function blood test for diabetes  08/09/2024   Complete foot exam   08/09/2024   Yearly kidney health urinalysis for diabetes  08/10/2024   Medicare Annual Wellness Visit  01/15/2025   Colon Cancer Screening  12/27/2028   Hepatitis C Screening  Completed   HIV Screening  Completed   HPV Vaccine  Aged Out   Meningitis B Vaccine  Aged Out    Advanced directives: (Declined) Advance directive discussed with you today. Even though you declined  this today, please call our office should you change your mind, and we can give you the proper paperwork for you to fill out. Advance Care Planning is important because it:  [x]  Makes sure you receive the medical care that is consistent with your values, goals, and preferences  [x]  It provides guidance to your family and loved ones and reduces their decisional burden about whether or not they are making the right decisions based on your wishes.  Follow the link provided in your after visit summary or read over the paperwork we have mailed to you to help you started getting your Advance Directives in place. If you need assistance in completing these, please reach out to us  so that we can help you!  See attachments for Preventive Care and Fall Prevention Tips.

## 2024-01-16 NOTE — Progress Notes (Signed)
 Because this visit was a virtual/telehealth visit,  certain criteria was not obtained, such a blood pressure, CBG if applicable, and timed get up and go. Any medications not marked as taking were not mentioned during the medication reconciliation part of the visit. Any vitals not documented were not able to be obtained due to this being a telehealth visit or patient was unable to self-report a recent blood pressure reading due to a lack of equipment at home via telehealth. Vitals that have been documented are verbally provided by the patient.   Subjective:   Casey Petty is a 63 y.o. who presents for a Medicare Wellness preventive visit.  As a reminder, Annual Wellness Visits don't include a physical exam, and some assessments may be limited, especially if this visit is performed virtually. We may recommend an in-person follow-up visit with your provider if needed.  Visit Complete: Virtual I connected with  Casey Petty on 01/16/24 by a audio enabled telemedicine application and verified that I am speaking with the correct person using two identifiers.  Patient Location: Home  Provider Location: Office/Clinic  I discussed the limitations of evaluation and management by telemedicine. The patient expressed understanding and agreed to proceed.  Vital Signs: Because this visit was a virtual/telehealth visit, some criteria may be missing or patient reported. Any vitals not documented were not able to be obtained and vitals that have been documented are patient reported.  VideoDeclined- This patient declined Librarian, academic. Therefore the visit was completed with audio only.  Persons Participating in Visit: Patient.  AWV Questionnaire: No: Patient Medicare AWV questionnaire was not completed prior to this visit.  Cardiac Risk Factors include: advanced age (>37men, >14 women);diabetes mellitus;hypertension;male gender;sedentary lifestyle;smoking/ tobacco  exposure;family history of premature cardiovascular disease     Objective:     Today's Vitals   01/16/24 1656  Weight: 149 lb (67.6 kg)  Height: 5' 7 (1.702 m)  PainSc: 0-No pain   Body mass index is 23.34 kg/m.     01/16/2024    4:59 PM 01/04/2023    9:33 AM 02/18/2022    8:50 AM 01/27/2022    9:09 AM 11/09/2021    9:47 AM 08/19/2020   11:06 AM 11/26/2019    9:43 AM  Advanced Directives  Does Patient Have a Medical Advance Directive? No No No No No No No  Would patient like information on creating a medical advance directive? No - Patient declined Yes (MAU/Ambulatory/Procedural Areas - Information given) No - Patient declined   No - Patient declined No - Guardian declined    Current Medications (verified) Outpatient Encounter Medications as of 01/16/2024  Medication Sig   amLODipine  (NORVASC ) 10 MG tablet TAKE ONE TABLET BY MOUTH DAILY AT 9 AM EVERY MORNING FOR BLOOD PRESSURE   aspirin  EC 81 MG tablet Take 1 tablet (81 mg total) by mouth daily.   baclofen  (LIORESAL ) 10 MG tablet TAKE TWO TABLETS BY MOUTH THREE TIMES DAILY AT 9AM, 1PM AND 5PM   Blood Glucose Monitoring Suppl (ACCU-CHEK GUIDE) w/Device KIT 1 each by Does not apply route in the morning, at noon, in the evening, and at bedtime.   Blood Glucose Monitoring Suppl (TRUE METRIX METER) w/Device KIT Check blood sugar fasting and before meals and again if pt feels bad (symptoms of hypo).   buPROPion  (WELLBUTRIN  XL) 150 MG 24 hr tablet Take 1 tablet (150 mg total) by mouth daily.   carvedilol  (COREG ) 12.5 MG tablet TAKE ONE TABLET BY MOUTH TWICE  DAILY @9AM -5PM EVERY MORNING AND EVERY EVENING   Continuous Blood Gluc Receiver (DEXCOM G6 RECEIVER) DEVI Use to check blood sugar TID.   Continuous Blood Gluc Sensor (DEXCOM G6 SENSOR) MISC Use to check blood sugar TID.   Continuous Blood Gluc Transmit (DEXCOM G6 TRANSMITTER) MISC Use to check blood sugar TID.   empagliflozin  (JARDIANCE ) 25 MG TABS tablet TAKE ONE TABLET BY MOUTH  BEFORE BREAKFAST   ezetimibe  (ZETIA ) 10 MG tablet TAKE ONE TABLET (10 MG) BY MOUTH DAILY AT 5 PM EVERY EVENING   glucose blood (ACCU-CHEK GUIDE) test strip Use as instructed up to 4 times daily   glucose blood (TRUE METRIX BLOOD GLUCOSE TEST) test strip Use as instructed before every meal nightly up to 4 times daily   hydrALAZINE  (APRESOLINE ) 100 MG tablet Take 1 tablet (100 mg total) by mouth 3 (three) times daily.   insulin  glargine (LANTUS  SOLOSTAR) 100 UNIT/ML Solostar Pen INJECT 10 UNITS UNDER THE SKIN DAILY. (DISCARD PEN 28 DAYS AFTER OPENING)   Insulin  Pen Needle (COMFORT EZ PEN NEEDLES) 32G X 4 MM MISC Use to inject insulin  once daily.   Misc. Devices MISC Wheelchair with Accessories: elevating leg rests (ELRs), wheel locks, extensions and anti-tippers. Cane or walker will not suffice   Misc. Devices MISC Rollator walker with seat.  Diagnosis - Stroke   rosuvastatin  (CRESTOR ) 40 MG tablet Take 1 tablet (40 mg total) by mouth daily.   TRUEplus Lancets 28G MISC Check blood sugar before every meal, nightly up to 4 times a day.   UREA  10 HYDRATING 10 % cream APPLY TO AFFECTED AREA EVERY DAY AS NEEDED   No facility-administered encounter medications on file as of 01/16/2024.    Allergies (verified) Lisinopril    History: Past Medical History:  Diagnosis Date   Diabetes mellitus without complication (HCC)    on meds   Hyperlipidemia    on meds   Hypertension    on meds   Neuromuscular disorder (HCC)    Stroke (HCC) 2020   R hemiparesis-abnormal gait   Past Surgical History:  Procedure Laterality Date   WISDOM TOOTH EXTRACTION Bilateral 06/2021   Family History  Problem Relation Age of Onset   Heart attack Father    Cancer Neg Hx    Colon polyps Neg Hx    Colon cancer Neg Hx    Esophageal cancer Neg Hx    Stomach cancer Neg Hx    Rectal cancer Neg Hx    Social History   Socioeconomic History   Marital status: Single    Spouse name: Not on file   Number of children:  Not on file   Years of education: Not on file   Highest education level: Not on file  Occupational History   Not on file  Tobacco Use   Smoking status: Every Day    Current packs/day: 0.25    Types: Cigarettes   Smokeless tobacco: Never   Tobacco comments:    Smoking 4 cigs/day  Vaping Use   Vaping status: Never Used  Substance and Sexual Activity   Alcohol use: Yes    Comment: 2 times a week - beer   Drug use: No   Sexual activity: Never  Other Topics Concern   Not on file  Social History Narrative   Not on file   Social Drivers of Health   Financial Resource Strain: Low Risk  (01/16/2024)   Overall Financial Resource Strain (CARDIA)    Difficulty of Paying Living Expenses: Not hard  at all  Food Insecurity: No Food Insecurity (01/16/2024)   Hunger Vital Sign    Worried About Running Out of Food in the Last Year: Never true    Ran Out of Food in the Last Year: Never true  Transportation Needs: No Transportation Needs (01/16/2024)   PRAPARE - Administrator, Civil Service (Medical): No    Lack of Transportation (Non-Medical): No  Physical Activity: Insufficiently Active (01/16/2024)   Exercise Vital Sign    Days of Exercise per Week: 3 days    Minutes of Exercise per Session: 30 min  Stress: No Stress Concern Present (01/16/2024)   [employer] of Occupational Health - Occupational Stress Questionnaire    Feeling of Stress : Not at all  Social Connections: Socially Integrated (01/16/2024)   Social Connection and Isolation Panel [NHANES]    Frequency of Communication with Friends and Family: More than three times a week    Frequency of Social Gatherings with Friends and Family: More than three times a week    Attends Religious Services: More than 4 times per year    Active Member of [employer] or Organizations: Yes    Attends Banker Meetings: 1 to 4 times per year    Marital Status: Married    Tobacco Counseling Ready to quit: Not  Answered Counseling given: Not Answered Tobacco comments: Smoking 4 cigs/day    Clinical Intake:  Pre-visit preparation completed: Yes  Pain : No/denies pain Pain Score: 0-No pain     BMI - recorded: 23.34 Nutritional Status: BMI of 19-24  Normal Nutritional Risks: None Diabetes: Yes CBG done?: No Did pt. bring in CBG monitor from home?: No  Lab Results  Component Value Date   HGBA1C 6.2 08/10/2023   HGBA1C 6.6 01/18/2023   HGBA1C 6.7 07/21/2022     How often do you need to have someone help you when you read instructions, pamphlets, or other written materials from your doctor or pharmacy?: 1 - Never  Interpreter Needed?: No  Information entered by :: Buckley Bradly N. Averie Meiner, LPN.   Activities of Daily Living     01/16/2024    5:00 PM  In your present state of health, do you have any difficulty performing the following activities:  Hearing? 0  Vision? 0  Difficulty concentrating or making decisions? 0  Walking or climbing stairs? 1  Comment Cane  Dressing or bathing? 0  Doing errands, shopping? 1  Comment Wife brings patient to appointments  Preparing Food and eating ? N  Using the Toilet? N  In the past six months, have you accidently leaked urine? N  Do you have problems with loss of bowel control? N  Managing your Medications? N  Managing your Finances? N  Housekeeping or managing your Housekeeping? N    Patient Care Team: Joaquin Mulberry, MD as PCP - General (Family Medicine) [facility] Kirsteins, Cecilia Coe, MD as Consulting Physician (Physical Medicine and Rehabilitation) [place], Karlene Overcast, DPM as Consulting Physician (Podiatry)  I have updated your Care Teams any recent Medical Services you may have received from other providers in the past year.     Assessment:    This is a routine wellness examination for Casey Petty.  Hearing/Vision screen Hearing Screening - Comments:: Denies hearing difficulties.  Vision Screening -  Comments:: No rx glasses - up to date with routine eye exams with [facility]    Goals Addressed  This Visit's Progress    01/16/2024: REMAIN INDEPENDENT.         Depression Screen     01/16/2024    4:59 PM 01/11/2024    9:13 AM 08/17/2023   10:31 AM 08/10/2023    8:40 AM 07/04/2023    9:00 AM 02/16/2023    9:28 AM 01/18/2023    8:48 AM  PHQ 2/9 Scores  PHQ - 2 Score 0 0 0 0 0 0 6  PHQ- 9 Score 0      24    Fall Risk     01/16/2024    4:59 PM 01/11/2024    9:13 AM 08/17/2023   10:31 AM 08/10/2023    8:39 AM 07/04/2023    8:59 AM  Fall Risk   Falls in the past year? 0 0 0 0 0  Number falls in past yr: 0   0 0  Injury with Fall? 0   0   Risk for fall due to : No Fall Risks   No Fall Risks   Follow up Falls evaluation completed   Falls evaluation completed     MEDICARE RISK AT HOME:  Medicare Risk at Home Any stairs in or around the home?: No If so, are there any without handrails?: No Home free of loose throw rugs in walkways, pet beds, electrical cords, etc?: Yes Adequate lighting in your home to reduce risk of falls?: Yes Life alert?: No Use of a cane, walker or w/c?: Yes Grab bars in the bathroom?: No Shower chair or bench in shower?: Yes Elevated toilet seat or a handicapped toilet?: Yes  TIMED UP AND GO:  Was the test performed?  No  Cognitive Function: 6CIT completed    01/16/2024    5:00 PM  MMSE - Mini Mental State Exam  Not completed: Unable to complete        01/16/2024    5:00 PM 01/04/2023    9:34 AM  6CIT Screen  What Year? 0 points 0 points  What month? 0 points 0 points  What time? 0 points 0 points  Count back from 20 0 points 0 points  Months in reverse 0 points 0 points  Repeat phrase 0 points 0 points  Total Score 0 points 0 points    Immunizations Immunization History  Administered Date(s) Administered   Influenza, Seasonal, Injecte, Preservative Fre 08/10/2023   Influenza,inj,Quad PF,6+ Mos 06/27/2014, 05/08/2019,  06/09/2021, 07/21/2022   Pneumococcal Polysaccharide-23 05/08/2019    Screening Tests Health Maintenance  Topic Date Due   DTaP/Tdap/Td (1 - Tdap) Never done   Zoster Vaccines- Shingrix (1 of 2) Never done   Pneumococcal Vaccine 59-99 Years old (2 of 2 - PCV) 05/07/2020   OPHTHALMOLOGY EXAM  11/18/2021   COVID-19 Vaccine (1 - 2024-25 season) Never done   HEMOGLOBIN A1C  02/07/2024   INFLUENZA VACCINE  03/08/2024   Diabetic kidney evaluation - eGFR measurement  08/09/2024   FOOT EXAM  08/09/2024   Diabetic kidney evaluation - Urine ACR  08/10/2024   Medicare Annual Wellness (AWV)  01/15/2025   Colonoscopy  12/27/2028   Hepatitis C Screening  Completed   HIV Screening  Completed   HPV VACCINES  Aged Out   Meningococcal B Vaccine  Aged Out    Health Maintenance  Health Maintenance Due  Topic Date Due   DTaP/Tdap/Td (1 - Tdap) Never done   Zoster Vaccines- Shingrix (1 of 2) Never done   Pneumococcal Vaccine 22-53 Years old (2 of  2 - PCV) 05/07/2020   OPHTHALMOLOGY EXAM  11/18/2021   COVID-19 Vaccine (1 - 2024-25 season) Never done   Health Maintenance Items Addressed: Referral sent to Optometry/Ophthalmology  Additional Screening:  Vision Screening: Recommended annual ophthalmology exams for early detection of glaucoma and other disorders of the eye. Would you like a referral to an eye doctor? Yes    Dental Screening: Recommended annual dental exams for proper oral hygiene  Community Resource Referral / Chronic Care Management: CRR required this visit?  No   CCM required this visit?  No   Plan:    I have personally reviewed and noted the following in the patient's chart:   Medical and social history Use of alcohol, tobacco or illicit drugs  Current medications and supplements including opioid prescriptions. Patient is not currently taking opioid prescriptions. Functional ability and status Nutritional status Physical activity Advanced directives List of  other physicians Hospitalizations, surgeries, and ER visits in previous 12 months Vitals Screenings to include cognitive, depression, and falls Referrals and appointments  In addition, I have reviewed and discussed with patient certain preventive protocols, quality metrics, and best practice recommendations. A written personalized care plan for preventive services as well as general preventive health recommendations were provided to patient.   Margette Sheldon, LPN   1/61/0960   After Visit Summary: (Declined) Due to this being a telephonic visit, with patients personalized plan was offered to patient but patient Declined AVS at this time   Notes: No new vaccinations per [facility].  Referral placed to [facility] for diabetic eye exam.

## 2024-02-01 ENCOUNTER — Other Ambulatory Visit: Payer: Self-pay | Admitting: Family Medicine

## 2024-02-01 DIAGNOSIS — I6930 Unspecified sequelae of cerebral infarction: Secondary | ICD-10-CM

## 2024-02-01 DIAGNOSIS — E1159 Type 2 diabetes mellitus with other circulatory complications: Secondary | ICD-10-CM

## 2024-02-01 DIAGNOSIS — Z720 Tobacco use: Secondary | ICD-10-CM

## 2024-02-01 DIAGNOSIS — E1169 Type 2 diabetes mellitus with other specified complication: Secondary | ICD-10-CM

## 2024-02-01 DIAGNOSIS — Z7984 Long term (current) use of oral hypoglycemic drugs: Secondary | ICD-10-CM

## 2024-02-06 ENCOUNTER — Telehealth: Payer: Self-pay | Admitting: Family Medicine

## 2024-02-06 NOTE — Telephone Encounter (Signed)
 Contacted pt left vm to confirmed appt

## 2024-02-06 NOTE — Telephone Encounter (Signed)
 Error

## 2024-02-06 NOTE — Telephone Encounter (Signed)
 2nd attempt contacted pt confirmed appt

## 2024-02-07 ENCOUNTER — Ambulatory Visit: Payer: Medicare HMO | Admitting: Family Medicine

## 2024-03-01 ENCOUNTER — Other Ambulatory Visit: Payer: Self-pay | Admitting: Family Medicine

## 2024-03-01 DIAGNOSIS — E1169 Type 2 diabetes mellitus with other specified complication: Secondary | ICD-10-CM

## 2024-03-01 DIAGNOSIS — Z7984 Long term (current) use of oral hypoglycemic drugs: Secondary | ICD-10-CM

## 2024-03-01 DIAGNOSIS — I6930 Unspecified sequelae of cerebral infarction: Secondary | ICD-10-CM

## 2024-03-01 DIAGNOSIS — I152 Hypertension secondary to endocrine disorders: Secondary | ICD-10-CM

## 2024-03-01 DIAGNOSIS — Z720 Tobacco use: Secondary | ICD-10-CM

## 2024-03-19 ENCOUNTER — Telehealth: Payer: Self-pay | Admitting: Family Medicine

## 2024-03-19 NOTE — Telephone Encounter (Signed)
 Confirmed appt for 8/13

## 2024-03-20 ENCOUNTER — Ambulatory Visit: Attending: Family Medicine | Admitting: Family Medicine

## 2024-03-20 ENCOUNTER — Encounter: Payer: Self-pay | Admitting: Family Medicine

## 2024-03-20 VITALS — BP 130/79 | HR 77 | Ht 67.0 in | Wt 137.0 lb

## 2024-03-20 DIAGNOSIS — I739 Peripheral vascular disease, unspecified: Secondary | ICD-10-CM

## 2024-03-20 DIAGNOSIS — E1159 Type 2 diabetes mellitus with other circulatory complications: Secondary | ICD-10-CM

## 2024-03-20 DIAGNOSIS — I6930 Unspecified sequelae of cerebral infarction: Secondary | ICD-10-CM

## 2024-03-20 DIAGNOSIS — Z794 Long term (current) use of insulin: Secondary | ICD-10-CM

## 2024-03-20 DIAGNOSIS — Z125 Encounter for screening for malignant neoplasm of prostate: Secondary | ICD-10-CM

## 2024-03-20 DIAGNOSIS — M25461 Effusion, right knee: Secondary | ICD-10-CM

## 2024-03-20 DIAGNOSIS — Z720 Tobacco use: Secondary | ICD-10-CM

## 2024-03-20 DIAGNOSIS — I69351 Hemiplegia and hemiparesis following cerebral infarction affecting right dominant side: Secondary | ICD-10-CM

## 2024-03-20 DIAGNOSIS — I152 Hypertension secondary to endocrine disorders: Secondary | ICD-10-CM

## 2024-03-20 DIAGNOSIS — E1169 Type 2 diabetes mellitus with other specified complication: Secondary | ICD-10-CM

## 2024-03-20 DIAGNOSIS — Z7984 Long term (current) use of oral hypoglycemic drugs: Secondary | ICD-10-CM

## 2024-03-20 LAB — POCT GLYCOSYLATED HEMOGLOBIN (HGB A1C): HbA1c, POC (controlled diabetic range): 5.6 % (ref 0.0–7.0)

## 2024-03-20 MED ORDER — HYDRALAZINE HCL 100 MG PO TABS: 100.0000 mg | ORAL_TABLET | Freq: Three times a day (TID) | ORAL | 1 refills | Status: DC

## 2024-03-20 MED ORDER — CARVEDILOL 12.5 MG PO TABS
ORAL_TABLET | ORAL | 1 refills | Status: DC
Start: 2024-03-20 — End: 2024-04-05

## 2024-03-20 MED ORDER — BUPROPION HCL ER (XL) 150 MG PO TB24: 150.0000 mg | ORAL_TABLET | Freq: Every day | ORAL | 1 refills | Status: DC

## 2024-03-20 MED ORDER — ROSUVASTATIN CALCIUM 40 MG PO TABS: 40.0000 mg | ORAL_TABLET | Freq: Every day | ORAL | 1 refills | Status: DC

## 2024-03-20 MED ORDER — AMLODIPINE BESYLATE 10 MG PO TABS: ORAL_TABLET | ORAL | 1 refills | Status: DC

## 2024-03-20 MED ORDER — BACLOFEN 10 MG PO TABS: 10.0000 mg | ORAL_TABLET | Freq: Three times a day (TID) | ORAL | 1 refills | Status: DC

## 2024-03-20 MED ORDER — LANTUS SOLOSTAR 100 UNIT/ML ~~LOC~~ SOPN: PEN_INJECTOR | SUBCUTANEOUS | 2 refills | Status: DC

## 2024-03-20 MED ORDER — EMPAGLIFLOZIN 25 MG PO TABS: 25.0000 mg | ORAL_TABLET | Freq: Every day | ORAL | 1 refills | Status: DC

## 2024-03-20 MED ORDER — EZETIMIBE 10 MG PO TABS: 10.0000 mg | ORAL_TABLET | Freq: Every day | ORAL | 1 refills | Status: DC

## 2024-03-20 NOTE — Patient Instructions (Signed)
 VISIT SUMMARY:  Today, we discussed the swelling in your right knee, your diabetes management, blood pressure, peripheral artery disease, smoking, and post-stroke care. We reviewed your medications and made some adjustments to better manage your conditions.  YOUR PLAN:  -RIGHT KNEE SWELLING: The swelling in your right knee is likely due to decreased movement after your stroke. We did not find any fluid accumulation. We will get an x-ray of your right knee to investigate further.  -TYPE 2 DIABETES MELLITUS: Your blood sugar levels have improved, but your recent weight loss suggests your insulin  dose may be too high. We will reduce your insulin  glargine to 8 units at night, and if you experience low blood sugar, reduce it further to 6 units. Continue taking empagliflozin  25 mg daily.  -HYPERTENSION: Your blood pressure is well-controlled with your current medications. Continue taking amlodipine , carvedilol , and hydralazine  as prescribed.  -PERIPHERAL ARTERY DISEASE OF LOWER EXTREMITIES: This condition involves reduced blood flow to your legs. You have decreased toe pressure on the right but no pain. Continue taking aspirin  and rosuvastatin  as prescribed. It is important to stop smoking to improve your condition.  -NICOTINE  DEPENDENCE: You are still smoking two cigarettes daily. Quitting smoking is crucial for your overall health, especially for your peripheral artery disease.  -RIGHT-SIDED WEAKNESS AND SPASTICITY AFTER STROKE: Your right-sided weakness and muscle stiffness are being managed with baclofen . We will continue this medication and assess the need for Botox injections with your rehabilitation doctor.  INSTRUCTIONS:  Please follow up with the x-ray of your right knee as ordered. Monitor your blood sugar levels closely and adjust your insulin  glargine dose if you experience low blood sugar. Continue taking all your medications as prescribed. Consider quitting smoking to improve your overall  health. Follow up with your rehabilitation doctor to discuss the potential need for Botox injections for your muscle stiffness.

## 2024-03-20 NOTE — Progress Notes (Signed)
 Subjective:  Patient ID: Casey Petty, male    DOB: 28-Apr-1961  Age: 63 y.o. MRN: [id number]  CC: Medical Management of Chronic Issues (Swelling in right knee)     Discussed the use of AI scribe software for clinical note transcription with the patient, who gave verbal consent to proceed.  History of Present Illness Casey Petty is a 63 year old male with a history of  type 2 diabetes mellitus (A1c 6.6) stroke (with right spastic hemiparesis s/p Botox injections by rehab), right adhesive capsulitis, dysarthria, Hypertension, Nicotine  dependence (3 Cigarettes /day, <20 pack years) and peripheral arterial disease who presents with right knee swelling.  He has experienced swelling in his right knee for about a month without pain or history of trauma. He has right-sided weakness from a previous stroke and takes baclofen  for stiffness. He has peripheral arterial disease with decreased toe pressure on the right per his last office vascular notes but no claudication. He smokes two cigarettes daily and takes aspirin  and a cholesterol-lowering medication.  He manages diabetes with Lantus  insulin  and Jardiance , with a recent A1c of 5.6. He has lost weight from 149 to 137 pounds due to reduced food intake. Denies hypoglycemia. His current medications include amlodipine , carvedilol , hydralazine , and rosuvastatin  for blood pressure and cholesterol management.    Past Medical History:  Diagnosis Date   Diabetes mellitus without complication (HCC)    on meds   Hyperlipidemia    on meds   Hypertension    on meds   Neuromuscular disorder (HCC)    Stroke (HCC) 2020   R hemiparesis-abnormal gait    Past Surgical History:  Procedure Laterality Date   WISDOM TOOTH EXTRACTION Bilateral 06/2021    Family History  Problem Relation Age of Onset   Heart attack Father    Cancer Neg Hx    Colon polyps Neg Hx    Colon cancer Neg Hx    Esophageal cancer Neg Hx    Stomach cancer Neg Hx    Rectal  cancer Neg Hx     Social History   Socioeconomic History   Marital status: Single    Spouse name: Not on file   Number of children: Not on file   Years of education: Not on file   Highest education level: Not on file  Occupational History   Not on file  Tobacco Use   Smoking status: Every Day    Current packs/day: 0.25    Types: Cigarettes   Smokeless tobacco: Never   Tobacco comments:    Smoking 4 cigs/day  Vaping Use   Vaping status: Never Used  Substance and Sexual Activity   Alcohol use: Yes    Comment: 2 times a week - beer   Drug use: No   Sexual activity: Never  Other Topics Concern   Not on file  Social History Narrative   Not on file   Social Drivers of Health   Financial Resource Strain: Low Risk  (01/16/2024)   Overall Financial Resource Strain (CARDIA)    Difficulty of Paying Living Expenses: Not hard at all  Food Insecurity: No Food Insecurity (01/16/2024)   Hunger Vital Sign    Worried About Running Out of Food in the Last Year: Never true    Ran Out of Food in the Last Year: Never true  Transportation Needs: No Transportation Needs (01/16/2024)   PRAPARE - Administrator, Civil Service (Medical): No    Lack of Transportation (Non-Medical): No  Physical Activity: Insufficiently Active (01/16/2024)   Exercise Vital Sign    Days of Exercise per Week: 3 days    Minutes of Exercise per Session: 30 min  Stress: No Stress Concern Present (01/16/2024)   [employer] of Occupational Health - Occupational Stress Questionnaire    Feeling of Stress : Not at all  Social Connections: Socially Integrated (01/16/2024)   Social Connection and Isolation Panel    Frequency of Communication with Friends and Family: More than three times a week    Frequency of Social Gatherings with Friends and Family: More than three times a week    Attends Religious Services: More than 4 times per year    Active Member of [employer] or Organizations: Yes    Attends Tax inspector Meetings: 1 to 4 times per year    Marital Status: Married    Allergies  Allergen Reactions   Lisinopril  Swelling    Angioedema- lips/face    Outpatient Medications Prior to Visit  Medication Sig Dispense Refill   amLODipine  (NORVASC ) 10 MG tablet TAKE ONE TABLET BY MOUTH DAILY AT 9 AM EVERY MORNING FOR BLOOD PRESSURE 90 tablet 1   aspirin  EC 81 MG tablet Take 1 tablet (81 mg total) by mouth daily. 100 tablet 0   baclofen  (LIORESAL ) 10 MG tablet TAKE TWO TABLETS BY MOUTH THREE TIMES DAILY AT 9AM, 1PM AND 5PM 180 tablet 1   Blood Glucose Monitoring Suppl (ACCU-CHEK GUIDE) w/Device KIT 1 each by Does not apply route in the morning, at noon, in the evening, and at bedtime. 1 kit 0   Blood Glucose Monitoring Suppl (TRUE METRIX METER) w/Device KIT Check blood sugar fasting and before meals and again if pt feels bad (symptoms of hypo). 1 kit 0   buPROPion  (WELLBUTRIN  XL) 150 MG 24 hr tablet TAKE ONE TABLET (150MG  TOTAL) BY MOUTH DAILY AT 9 AM 90 tablet 1   carvedilol  (COREG ) 12.5 MG tablet TAKE ONE TABLET BY MOUTH TWICE DAILY @9AM -5PM EVERY MORNING AND EVERY EVENING 180 tablet 1   Continuous Blood Gluc Receiver (DEXCOM G6 RECEIVER) DEVI Use to check blood sugar TID. 1 each 0   Continuous Blood Gluc Sensor (DEXCOM G6 SENSOR) MISC Use to check blood sugar TID. 3 each 3   Continuous Blood Gluc Transmit (DEXCOM G6 TRANSMITTER) MISC Use to check blood sugar TID. 1 each 3   ezetimibe  (ZETIA ) 10 MG tablet TAKE ONE TABLET (10 MG) BY MOUTH DAILY AT 5 PM EVERY EVENING 90 tablet 2   glucose blood (ACCU-CHEK GUIDE) test strip Use as instructed up to 4 times daily 100 each 12   glucose blood (TRUE METRIX BLOOD GLUCOSE TEST) test strip Use as instructed before every meal nightly up to 4 times daily 100 each 12   hydrALAZINE  (APRESOLINE ) 100 MG tablet TAKE ONE TABLET (100MG  TOTAL) BY MOUTH THREE TIMES DAILY AT 9AM, 1PM AND 5PM 270 tablet 1   insulin  glargine (LANTUS  SOLOSTAR) 100 UNIT/ML Solostar  Pen INJECT 10 UNITS UNDER THE SKIN DAILY. (DISCARD PEN 28 DAYS AFTER OPENING) 30 mL 2   Insulin  Pen Needle (COMFORT EZ PEN NEEDLES) 32G X 4 MM MISC Use to inject insulin  once daily. 100 each 2   JARDIANCE  25 MG TABS tablet TAKE ONE TABLET BY MOUTH BEFORE BREAKFAST DAILY AT 9 AM 90 tablet 1   Misc. Devices MISC Wheelchair with Accessories: elevating leg rests (ELRs), wheel locks, extensions and anti-tippers. Cane or walker will not suffice 1 each 0   Misc.  Devices MISC Rollator walker with seat.  Diagnosis - Stroke 1 each 0   rosuvastatin  (CRESTOR ) 40 MG tablet TAKE ONE TABLET (40MG  TOTAL) BY MOUTH DAILY AT 5 PM 90 tablet 1   TRUEplus Lancets 28G MISC Check blood sugar before every meal, nightly up to 4 times a day. 100 each 12   UREA  10 HYDRATING 10 % cream APPLY TO AFFECTED AREA EVERY DAY AS NEEDED 85 g 0   No facility-administered medications prior to visit.     ROS Review of Systems  Constitutional:  Negative for activity change and appetite change.  HENT:  Negative for sinus pressure and sore throat.   Respiratory:  Negative for chest tightness, shortness of breath and wheezing.   Cardiovascular:  Negative for chest pain and palpitations.  Gastrointestinal:  Negative for abdominal distention, abdominal pain and constipation.  Genitourinary: Negative.   Musculoskeletal:        See HPI  Psychiatric/Behavioral:  Negative for behavioral problems and dysphoric mood.     Objective:  BP 130/79   Pulse 77   Ht 5' 7 (1.702 m)   Wt 137 lb (62.1 kg)   SpO2 99%   BMI 21.46 kg/m      03/20/2024    8:47 AM 01/16/2024    4:56 PM 01/11/2024    9:09 AM  BP/Weight  Systolic BP 130  898  Diastolic BP 79  62  Wt. (Lbs) 137 149 144  BMI 21.46 kg/m2 23.34 kg/m2 22.55 kg/m2      Physical Exam Constitutional:      Appearance: He is well-developed.  Cardiovascular:     Rate and Rhythm: Normal rate.     Heart sounds: Normal heart sounds. No murmur heard. Pulmonary:     Effort:  Pulmonary effort is normal.     Breath sounds: Normal breath sounds. No wheezing or rales.  Chest:     Chest wall: No tenderness.  Abdominal:     General: Bowel sounds are normal. There is no distension.     Palpations: Abdomen is soft. There is no mass.     Tenderness: There is no abdominal tenderness.  Musculoskeletal:     Right lower leg: No edema.     Left lower leg: No edema.     Comments: Bony prominence of medial aspect of tibia. No evidence of effusion, no TTP on ROM  Neurological:     Mental Status: He is alert and oriented to person, place, and time.     Comments: RIGHT UPPER EXTREMITY, RIGHT LOWER EXTREMITY spasticity  Psychiatric:        Mood and Affect: Mood normal.        Latest Ref Rng & Units 08/10/2023    9:27 AM 01/18/2023    9:18 AM 08/26/2022   10:43 AM  CMP  Glucose 70 - 99 mg/dL 83  849  872   BUN 8 - 27 mg/dL 7  8  6    Creatinine 0.76 - 1.27 mg/dL 9.10  9.02  9.04   Sodium 134 - 144 mmol/L 139  137  141   Potassium 3.5 - 5.2 mmol/L 4.6  4.4  4.4   Chloride 96 - 106 mmol/L 102  101  103   CO2 20 - 29 mmol/L 21  20  22    Calcium  8.6 - 10.2 mg/dL 9.9  89.9  9.7   Total Protein 6.0 - 8.5 g/dL 7.1  7.1  7.0   Total Bilirubin 0.0 - 1.2 mg/dL 0.4  0.4  0.4   Alkaline Phos 44 - 121 IU/L 108  119  98   AST 0 - 40 IU/L 80  37  35   ALT 0 - 44 IU/L 56  38  38     Lipid Panel     Component Value Date/Time   CHOL 119 07/22/2022 0832   TRIG 101 07/22/2022 0832   HDL 43 07/22/2022 0832   CHOLHDL 3.2 08/31/2021 0843   CHOLHDL 3.5 07/18/2014 0922   VLDL NOT CALC 07/18/2014 0922   LDLCALC 57 07/22/2022 0832    CBC    Component Value Date/Time   WBC 10.2 03/18/2019 1123   RBC 5.00 03/18/2019 1123   HGB 15.2 03/18/2019 1123   HCT 46.2 03/18/2019 1123   PLT 278 03/18/2019 1123   MCV 92.4 03/18/2019 1123   MCH 30.4 03/18/2019 1123   MCHC 32.9 03/18/2019 1123   RDW 12.8 03/18/2019 1123   LYMPHSABS 3.1 03/05/2019 0702   MONOABS 1.6 (H) 03/05/2019 0702    EOSABS 0.0 03/05/2019 0702   BASOSABS 0.0 03/05/2019 0702    Lab Results  Component Value Date   HGBA1C 5.6 03/20/2024   Lab Results  Component Value Date   HGBA1C 5.6 03/20/2024   HGBA1C 6.2 08/10/2023   HGBA1C 6.6 01/18/2023        Assessment & Plan Right knee swelling Swelling without pain, likely due to decreased movement post-stroke. No fluid accumulation on examination. - Order x-ray of the right knee.  Type 2 diabetes mellitus A1c at 5.6, improved from 6.2.  - Decrease insulin  glargine to 8 units at night to prevent hypoglycemia - If hypoglycemia occurs, reduce insulin  glargine to 6 units. - Continue empagliflozin  25 mg daily. -Counseled on Diabetic diet, the healthy plate, 849 minutes of moderate intensity exercise/week Blood sugar logs with fasting goals of 80-120 mg/dl, random of less than 819 and in the event of sugars less than 60 mg/dl or greater than 599 mg/dl encouraged to notify the clinic. Advised on the need for annual eye exams, annual foot exams, Pneumonia vaccine.  Hypertension associated with Type 2 Diabetes Long term use of insulin  Blood pressure well-controlled with current medications. - Continue amlodipine , carvedilol , and hydralazine  as prescribed. -Counseled on blood pressure goal of less than 130/80, low-sodium, DASH diet, medication compliance, 150 minutes of moderate intensity exercise per week. Discussed medication compliance, adverse effects.   Peripheral artery disease of lower extremities Decreased toe pressure on the right, no pain or need for intervention. - Continue aspirin  and rosuvastatin  as prescribed. - Encourage smoking cessation.  Nicotine  dependence Continues to smoke two cigarettes daily, advised to quit. - Encourage smoking cessation.  History of Stroke/ Right-sided weakness and spasticity after stroke Managed with baclofen , no pain or new symptoms. - Continue baclofen  as prescribed. - Assess need for Botox injections  with rehab doctor.     Healthcare maintenance: Does not meet criteria for lung ca screening Screening for Prostate cancer Will order PSA  No orders of the defined types were placed in this encounter.   Follow-up: No follow-ups on file.       Corrina Sabin, MD, FAAFP. [facility] [place], [place] [phone]   03/20/2024, 8:56 AM

## 2024-03-21 LAB — CMP14+EGFR
ALT: 15 IU/L (ref 0–44)
AST: 30 IU/L (ref 0–40)
Albumin: 4.5 g/dL (ref 3.8–4.9)
Alkaline Phosphatase: 108 IU/L (ref 44–121)
BUN/Creatinine Ratio: 4 — ABNORMAL LOW (ref 10–24)
BUN: 3 mg/dL — ABNORMAL LOW (ref 8–27)
Bilirubin Total: 0.3 mg/dL (ref 0.0–1.2)
CO2: 21 mmol/L (ref 20–29)
Calcium: 9.6 mg/dL (ref 8.6–10.2)
Chloride: 101 mmol/L (ref 96–106)
Creatinine, Ser: 0.81 mg/dL (ref 0.76–1.27)
Globulin, Total: 2.5 g/dL (ref 1.5–4.5)
Glucose: 75 mg/dL (ref 70–99)
Potassium: 4.3 mmol/L (ref 3.5–5.2)
Sodium: 141 mmol/L (ref 134–144)
Total Protein: 7 g/dL (ref 6.0–8.5)
eGFR: 101 mL/min/1.73 (ref >59)

## 2024-03-21 LAB — LP+NON-HDL CHOLESTEROL
Cholesterol, Total: 161 mg/dL (ref 100–199)
HDL: 54 mg/dL (ref >39)
LDL Chol Calc (NIH): 86 mg/dL (ref 0–99)
Total Non-HDL-Chol (LDL+VLDL): 107 mg/dL (ref 0–129)
Triglycerides: 117 mg/dL (ref 0–149)
VLDL Cholesterol Cal: 21 mg/dL (ref 5–40)

## 2024-03-26 LAB — SPECIMEN STATUS REPORT

## 2024-03-26 LAB — PSA: Prostate Specific Ag, Serum: 1 ng/mL (ref 0.0–4.0)

## 2024-03-28 ENCOUNTER — Ambulatory Visit: Payer: Self-pay | Admitting: Family Medicine

## 2024-04-04 ENCOUNTER — Other Ambulatory Visit: Payer: Self-pay | Admitting: Family Medicine

## 2024-04-04 DIAGNOSIS — I152 Hypertension secondary to endocrine disorders: Secondary | ICD-10-CM

## 2024-04-05 ENCOUNTER — Ambulatory Visit: Admitting: Podiatry

## 2024-04-06 ENCOUNTER — Other Ambulatory Visit: Payer: Self-pay | Admitting: Family Medicine

## 2024-04-12 ENCOUNTER — Emergency Department (HOSPITAL_COMMUNITY)

## 2024-04-12 ENCOUNTER — Encounter (HOSPITAL_COMMUNITY): Payer: Self-pay | Admitting: Emergency Medicine

## 2024-04-12 ENCOUNTER — Encounter: Payer: Self-pay | Admitting: Physical Medicine & Rehabilitation

## 2024-04-12 ENCOUNTER — Other Ambulatory Visit: Payer: Self-pay

## 2024-04-12 ENCOUNTER — Emergency Department (HOSPITAL_COMMUNITY)
Admission: EM | Admit: 2024-04-12 | Discharge: 2024-04-13 | Disposition: A | Discharge status: Home / Self Care | Attending: Emergency Medicine | Admitting: Emergency Medicine

## 2024-04-12 ENCOUNTER — Encounter: Attending: Physical Medicine & Rehabilitation | Admitting: Physical Medicine & Rehabilitation

## 2024-04-12 VITALS — BP 120/77 | HR 96 | Ht 67.0 in | Wt 135.0 lb

## 2024-04-12 DIAGNOSIS — E119 Type 2 diabetes mellitus without complications: Secondary | ICD-10-CM | POA: Diagnosis not present

## 2024-04-12 DIAGNOSIS — M545 Low back pain, unspecified: Secondary | ICD-10-CM | POA: Insufficient documentation

## 2024-04-12 DIAGNOSIS — Z7982 Long term (current) use of aspirin: Secondary | ICD-10-CM | POA: Insufficient documentation

## 2024-04-12 DIAGNOSIS — I10 Essential (primary) hypertension: Secondary | ICD-10-CM | POA: Insufficient documentation

## 2024-04-12 DIAGNOSIS — G8111 Spastic hemiplegia affecting right dominant side: Secondary | ICD-10-CM | POA: Diagnosis not present

## 2024-04-12 DIAGNOSIS — Z79899 Other long term (current) drug therapy: Secondary | ICD-10-CM | POA: Insufficient documentation

## 2024-04-12 DIAGNOSIS — R0789 Other chest pain: Secondary | ICD-10-CM | POA: Diagnosis not present

## 2024-04-12 DIAGNOSIS — I2510 Atherosclerotic heart disease of native coronary artery without angina pectoris: Secondary | ICD-10-CM | POA: Diagnosis not present

## 2024-04-12 DIAGNOSIS — R079 Chest pain, unspecified: Secondary | ICD-10-CM | POA: Diagnosis not present

## 2024-04-12 DIAGNOSIS — M5459 Other low back pain: Secondary | ICD-10-CM | POA: Diagnosis not present

## 2024-04-12 DIAGNOSIS — Z794 Long term (current) use of insulin: Secondary | ICD-10-CM | POA: Diagnosis not present

## 2024-04-12 DIAGNOSIS — I700 Atherosclerosis of aorta: Secondary | ICD-10-CM | POA: Diagnosis not present

## 2024-04-12 DIAGNOSIS — R1032 Left lower quadrant pain: Secondary | ICD-10-CM | POA: Diagnosis not present

## 2024-04-12 DIAGNOSIS — R7989 Other specified abnormal findings of blood chemistry: Secondary | ICD-10-CM | POA: Diagnosis not present

## 2024-04-12 LAB — CBC WITH DIFFERENTIAL/PLATELET
Abs Immature Granulocytes: 0.05 K/uL (ref 0.00–0.07)
Basophils Absolute: 0.1 K/uL (ref 0.0–0.1)
Basophils Relative: 1 %
Eosinophils Absolute: 0.5 K/uL (ref 0.0–0.5)
Eosinophils Relative: 6 %
HCT: 51.1 % (ref 39.0–52.0)
Hemoglobin: 16.9 g/dL (ref 13.0–17.0)
Immature Granulocytes: 1 %
Lymphocytes Relative: 25 %
Lymphs Abs: 2.4 K/uL (ref 0.7–4.0)
MCH: 32.7 pg (ref 26.0–34.0)
MCHC: 33.1 g/dL (ref 30.0–36.0)
MCV: 98.8 fL (ref 80.0–100.0)
Monocytes Absolute: 0.8 K/uL (ref 0.1–1.0)
Monocytes Relative: 8 %
Neutro Abs: 5.6 K/uL (ref 1.7–7.7)
Neutrophils Relative %: 59 %
Platelets: 289 K/uL (ref 150–400)
RBC: 5.17 MIL/uL (ref 4.22–5.81)
RDW: 14.2 % (ref 11.5–15.5)
WBC: 9.4 K/uL (ref 4.0–10.5)
nRBC: 0 % (ref 0.0–0.2)

## 2024-04-12 LAB — D-DIMER, QUANTITATIVE: D-Dimer, Quant: 1.6 ug{FEU}/mL — ABNORMAL HIGH (ref 0.00–0.50)

## 2024-04-12 LAB — COMPREHENSIVE METABOLIC PANEL WITH GFR
ALT: 18 U/L (ref 0–44)
AST: 29 U/L (ref 15–41)
Albumin: 3.9 g/dL (ref 3.5–5.0)
Alkaline Phosphatase: 110 U/L (ref 38–126)
Anion gap: 15 (ref 5–15)
BUN: <5 mg/dL — ABNORMAL LOW (ref 6–20)
CO2: 22 mmol/L (ref 22–32)
Calcium: 9.3 mg/dL (ref 8.9–10.3)
Chloride: 100 mmol/L (ref 98–111)
Creatinine, Ser: 0.99 mg/dL (ref 0.61–1.24)
GFR, Estimated: >60 mL/min (ref >60)
Glucose, Bld: 112 mg/dL — ABNORMAL HIGH (ref 70–99)
Potassium: 4.2 mmol/L (ref 3.5–5.1)
Sodium: 137 mmol/L (ref 135–145)
Total Bilirubin: 0.8 mg/dL (ref 0.0–1.2)
Total Protein: 7.5 g/dL (ref 6.5–8.1)

## 2024-04-12 LAB — TROPONIN I (HIGH SENSITIVITY): Troponin I (High Sensitivity): 3 ng/L

## 2024-04-12 MED ORDER — ABOBOTULINUMTOXINA 500 UNITS IM SOLR
1500.0000 [IU] | Freq: Once | INTRAMUSCULAR | Status: AC
  Administered 2024-04-12: 1500 [IU] via INTRAMUSCULAR

## 2024-04-12 MED ORDER — SODIUM CHLORIDE (PF) 0.9 % IJ SOLN
7.5000 mL | Freq: Once | INTRAMUSCULAR | Status: AC
  Administered 2024-04-12: 7.5 mL

## 2024-04-12 NOTE — Progress Notes (Signed)
 Dysport Injection for spasticity using needle EMG guidance  Pt has been responding well to botulinum toxin injections for stroke related RIght spastic hemiparesis  Dilution: 200 Units/ml Indication: Severe spasticity which interferes with ADL,mobility and/or  hygiene and is unresponsive to medication management and other conservative care Informed consent was obtained after describing risks and benefits of the procedure with the patient. This includes bleeding, bruising, infection, excessive weakness, or medication side effects. A REMS form is on file and signed. Needle:  needle electrode Number of units per muscle  RIGHT  Biceps:            200     Brachialis 200U  Brachioradialis 100 FCR                 200                   FDS                 200 FDP                 200 Lumbricals  100    Hamstrings 300U All injections were done after obtaining appropriate EMG activity and after negative drawback for blood. The patient tolerated the procedure well. Post procedure instructions were given.

## 2024-04-12 NOTE — ED Provider Triage Note (Signed)
 Emergency Medicine Provider Triage Evaluation Note  Casey Petty , a 63 y.o. male  was evaluated in triage.  Pt complains of right-sided chest pain that began acutely tonight.  Review of Systems  Positive: Right-sided chest pain, pleuritic chest pain Negative: Shortness of breath, leg swelling  Physical Exam  BP (!) 144/84 (BP Location: Left Arm)   Pulse 99   Temp 98.2 F (36.8 C) (Oral)   Resp 17   Wt 61.2 kg   SpO2 100%   BMI 21.13 kg/m  Gen:   Awake, no distress   Resp:  Normal effort  MSK:   Moves extremities without difficulty   Medical Decision Making  Medically screening exam initiated at 10:15 PM.  Appropriate orders placed.  Casey Petty was informed that the remainder of the evaluation will be completed by another provider, this initial triage assessment does not replace that evaluation, and the importance of remaining in the ED until their evaluation is complete.    Casey Jerilynn RAMAN, MD 04/12/24 2217

## 2024-04-12 NOTE — ED Triage Notes (Addendum)
 Pt in with stabbing R sided rib pain onset 2hrs ago while lying in bed. Denies any pain radiation to central chest. No sob. Pt does have hx of CVA with R sided deficits   VS w/EMS: 164/86 102HR 18RR 95%RA

## 2024-04-13 ENCOUNTER — Emergency Department (HOSPITAL_COMMUNITY)

## 2024-04-13 DIAGNOSIS — R7989 Other specified abnormal findings of blood chemistry: Secondary | ICD-10-CM | POA: Diagnosis not present

## 2024-04-13 DIAGNOSIS — I2510 Atherosclerotic heart disease of native coronary artery without angina pectoris: Secondary | ICD-10-CM | POA: Diagnosis not present

## 2024-04-13 DIAGNOSIS — M545 Low back pain, unspecified: Secondary | ICD-10-CM | POA: Diagnosis not present

## 2024-04-13 DIAGNOSIS — I700 Atherosclerosis of aorta: Secondary | ICD-10-CM | POA: Diagnosis not present

## 2024-04-13 DIAGNOSIS — R079 Chest pain, unspecified: Secondary | ICD-10-CM | POA: Diagnosis not present

## 2024-04-13 LAB — TROPONIN I (HIGH SENSITIVITY): Troponin I (High Sensitivity): 4 ng/L (ref <18)

## 2024-04-13 MED ORDER — LIDOCAINE 5 % EX PTCH: 1.0000 | MEDICATED_PATCH | CUTANEOUS | 0 refills | Status: AC

## 2024-04-13 MED ORDER — IOHEXOL 350 MG/ML SOLN
75.0000 mL | Freq: Once | INTRAVENOUS | Status: AC | PRN
  Administered 2024-04-13: 75 mL via INTRAVENOUS

## 2024-04-13 NOTE — Discharge Instructions (Addendum)
 Your evaluation in the emergency department today was reassuring.  We recommend the use of Lidoderm  patches for pain control.  Continue follow-up with a primary care doctor for reevaluation of your symptoms.  Return if symptoms persist or worsen.

## 2024-04-13 NOTE — ED Provider Notes (Signed)
  EMERGENCY DEPARTMENT AT [facility] Provider Note   CSN: [id number] Arrival date & time: 04/12/24  2159     Patient presents with: Rib Pain   Armaan Pond is a 63 y.o. male.   63 y/o male with hx of CVA (residual R sided deficits), HTN, DM HLD presents to the emergency department for evaluation of right sided back pain.  Triage note references presence of the pain for a few hours; however, he states that pain actually started about 2 days ago.  It is worse with certain movements.  Not associated with any central chest pain, shortness of breath.  No recent falls, trauma, injury.  Denies any hematuria, dysuria or frequency as well as fevers.  He has not taken any medications for his symptoms.  The history is provided by the patient. No language interpreter was used.       Prior to Admission medications   Medication Sig Start Date End Date Taking? Authorizing Provider  lidocaine  (LIDODERM ) 5 % Place 1 patch onto the skin daily. Remove & Discard patch within 12 hours or as directed by MD 04/13/24  Yes Keith Sor, PA-C  amLODipine  (NORVASC ) 10 MG tablet TAKE ONE TABLET BY MOUTH DAILY AT 9 AM EVERY MORNING FOR BLOOD PRESSURE 04/05/24   Delbert Clam, MD  aspirin  EC 81 MG tablet Take 1 tablet (81 mg total) by mouth daily. 10/03/22   Newlin, Enobong, MD  baclofen  (LIORESAL ) 10 MG tablet Take 1 tablet (10 mg total) by mouth 3 (three) times daily. 03/20/24   Newlin, Enobong, MD  BD PEN NEEDLE NANO ULTRAFINE 32G X 4 MM MISC USE TO INJECT INSULIN  ONCE DAILY 04/08/24   Newlin, Enobong, MD  Blood Glucose Monitoring Suppl (ACCU-CHEK GUIDE) w/Device KIT 1 each by Does not apply route in the morning, at noon, in the evening, and at bedtime. 03/01/21   Newlin, Enobong, MD  Blood Glucose Monitoring Suppl (TRUE METRIX METER) w/Device KIT Check blood sugar fasting and before meals and again if pt feels bad (symptoms of hypo). 04/24/19   Fulp, Cammie, MD  buPROPion  (WELLBUTRIN  XL) 150 MG 24  hr tablet Take 1 tablet (150 mg total) by mouth daily. For smoking cessation 03/20/24   Newlin, Enobong, MD  carvedilol  (COREG ) 12.5 MG tablet TAKE ONE TABLET BY MOUTH TWICE DAILY AT 9AM & 5PM EVERY MORNING AND EVERY EVENING 04/05/24   Delbert Clam, MD  Continuous Blood Gluc Receiver (DEXCOM G6 RECEIVER) DEVI Use to check blood sugar TID. 06/02/22   Newlin, Enobong, MD  Continuous Blood Gluc Sensor (DEXCOM G6 SENSOR) MISC Use to check blood sugar TID. 06/02/22   Newlin, Enobong, MD  Continuous Blood Gluc Transmit (DEXCOM G6 TRANSMITTER) MISC Use to check blood sugar TID. 06/02/22   Newlin, Enobong, MD  empagliflozin  (JARDIANCE ) 25 MG TABS tablet Take 1 tablet (25 mg total) by mouth daily. 03/20/24   Newlin, Enobong, MD  ezetimibe  (ZETIA ) 10 MG tablet Take 1 tablet (10 mg total) by mouth daily. 03/20/24   Newlin, Enobong, MD  glucose blood (ACCU-CHEK GUIDE) test strip Use as instructed up to 4 times daily 03/01/21   Newlin, Enobong, MD  glucose blood (TRUE METRIX BLOOD GLUCOSE TEST) test strip Use as instructed before every meal nightly up to 4 times daily 02/24/21   Newlin, Enobong, MD  hydrALAZINE  (APRESOLINE ) 100 MG tablet Take 1 tablet (100 mg total) by mouth 3 (three) times daily. 03/20/24   Newlin, Enobong, MD  insulin  glargine (LANTUS  SOLOSTAR) 100 UNIT/ML  Solostar Pen INJECT 8 UNITS UNDER THE SKIN DAILY. (DISCARD PEN 28 DAYS AFTER OPENING) 03/20/24   Delbert Clam, MD  Misc. Devices MISC Wheelchair with Accessories: elevating leg rests (ELRs), wheel locks, extensions and anti-tippers. Cane or walker will not suffice 12/23/21   Delbert Clam, MD  Misc. Devices MISC Rollator walker with seat.  Diagnosis - Stroke 01/19/22   Delbert Clam, MD  rosuvastatin  (CRESTOR ) 40 MG tablet Take 1 tablet (40 mg total) by mouth daily. 03/20/24   Newlin, Enobong, MD  TRUEplus Lancets 28G MISC Check blood sugar before every meal, nightly up to 4 times a day. 02/24/21   Newlin, Enobong, MD  UREA  10 HYDRATING 10 %  cream APPLY TO AFFECTED AREA EVERY DAY AS NEEDED 08/30/22   Standiford, Marsa FALCON, DPM    Allergies: Lisinopril     Review of Systems Ten systems reviewed and are negative for acute change, except as noted in the HPI.    Updated Vital Signs BP (!) 157/94 (BP Location: Left Arm)   Pulse 92   Temp 98.9 F (37.2 C)   Resp 16   Wt 61.2 kg   SpO2 98%   BMI 21.13 kg/m   Physical Exam Vitals and nursing note reviewed.  Constitutional:      General: He is not in acute distress.    Appearance: He is well-developed. He is not diaphoretic.     Comments: Nontoxic appearing and in NAD  HENT:     Head: Normocephalic and atraumatic.  Eyes:     General: No scleral icterus.    Conjunctiva/sclera: Conjunctivae normal.  Cardiovascular:     Rate and Rhythm: Normal rate and regular rhythm.     Pulses: Normal pulses.  Pulmonary:     Effort: Pulmonary effort is normal. No respiratory distress.     Comments: Respirations even and unlabored Musculoskeletal:        General: Tenderness present. Normal range of motion.     Cervical back: Normal range of motion.     Comments: TTP to the right lumbosacral paraspinal muscles without appreciable spasm.  No tenderness to the lumbosacral midline.  No bony deformities, step-offs, crepitus.  Skin:    General: Skin is warm and dry.     Coloration: Skin is not pale.     Findings: No erythema or rash.  Neurological:     Mental Status: He is alert and oriented to person, place, and time.     Coordination: Coordination normal.     Comments: Spastic hemiparesis, right side. No other deficits noted.  Psychiatric:        Behavior: Behavior normal.     (all labs ordered are listed, but only abnormal results are displayed) Labs Reviewed  COMPREHENSIVE METABOLIC PANEL WITH GFR - Abnormal; Notable for the following components:      Result Value   Glucose, Bld 112 (*)    BUN <5 (*)    All other components within normal limits  D-DIMER, QUANTITATIVE -  Abnormal; Notable for the following components:   D-Dimer, Quant 1.60 (*)    All other components within normal limits  CBC WITH DIFFERENTIAL/PLATELET  TROPONIN I (HIGH SENSITIVITY)  TROPONIN I (HIGH SENSITIVITY)    EKG: None  Radiology: CT Angio Chest PE W and/or Wo Contrast Result Date: 04/13/2024 CLINICAL DATA:  Right side chest pain.  Positive D-dimer. EXAM: CT ANGIOGRAPHY CHEST WITH CONTRAST TECHNIQUE: Multidetector CT imaging of the chest was performed using the standard protocol during bolus administration of intravenous contrast.  Multiplanar CT image reconstructions and MIPs were obtained to evaluate the vascular anatomy. RADIATION DOSE REDUCTION: This exam was performed according to the departmental dose-optimization program which includes automated exposure control, adjustment of the mA and/or kV according to patient size and/or use of iterative reconstruction technique. CONTRAST:  75mL OMNIPAQUE  IOHEXOL  350 MG/ML SOLN COMPARISON:  None Available. FINDINGS: Cardiovascular: Heart is normal size. Aorta is normal caliber. No filling defects in the pulmonary arteries to suggest pulmonary emboli. Coronary artery and aortic atherosclerosis. Mediastinum/Nodes: No mediastinal, hilar, or axillary adenopathy. Trachea and esophagus are unremarkable. Thyroid unremarkable. Lungs/Pleura: Minimal dependent atelectasis. No confluent opacities or effusions. Upper Abdomen: No acute findings Musculoskeletal: Chest wall soft tissues are unremarkable. No acute bony abnormality. Review of the MIP images confirms the above findings. IMPRESSION: No evidence of pulmonary embolus. No acute cardiopulmonary disease. Coronary artery disease. Aortic Atherosclerosis (ICD10-I70.0). Electronically Signed   By: Franky Crease M.D.   On: 04/13/2024 01:06   DG Chest 2 View Result Date: 04/12/2024 CLINICAL DATA:  Right chest pain EXAM: CHEST - 2 VIEW COMPARISON:  02/28/2019 FINDINGS: Heart and mediastinal contours are within  normal limits. No focal opacities or effusions. No acute bony abnormality. IMPRESSION: No active cardiopulmonary disease. Electronically Signed   By: Franky Crease M.D.   On: 04/12/2024 22:34     Procedures   Medications Ordered in the ED  iohexol  (OMNIPAQUE ) 350 MG/ML injection 75 mL (75 mLs Intravenous Contrast Given 04/13/24 0058)    Clinical Course as of 04/13/24 0426  Sat Apr 13, 2024  0421 Already on Baclofen  TID; do not want to add another muscle relaxer for symptom management. Will give Rx for Lidoderm  patches.  Pain is reproducible on palpation suggestive of musculoskeletal etiology.  Question whether his chronic spasticity in the right upper and lower extremities may be contributing to his pain today.  ED evaluation negative for ischemic cardiac pathology.  Symptoms are also atypical for this.  A D-dimer was ordered by prior provider.  Subsequent CTA negative for pulmonary embolus or other acute cardiopulmonary abnormality.  Otherwise CBC and metabolic panel are at baseline.  Did discuss with patient the possibility of a kidney stone; however, his kidney function is preserved.  He just voided.  Do not feel it is necessary to hold the patient in the emergency department pending urinalysis.  Management of obstructing ureterolithiasis would also primarily focus on pain control. Patient advised to return for any worsening or persistent symptoms. [KH]    Clinical Course User Index [KH] Keith Sor, PA-C                                 Medical Decision Making Amount and/or Complexity of Data Reviewed Radiology: ordered.  Risk Prescription drug management.   This patient presents to the ED for concern of R sided back pain, this involves an extensive number of treatment options, and is a complaint that carries with it a high risk of complications and morbidity.  The differential diagnosis includes MSK strain vs kidney stone vs PNA vs pleural effusion vs PE vs biliary colic   Co  morbidities that complicate the patient evaluation  CVA HTN DM HLD   Additional history obtained:  Additional history obtained from spouse, at bedside   Lab Tests:  I Ordered, and personally interpreted labs.  The pertinent results include:  D dimer 1.6   Imaging Studies ordered:  I ordered imaging studies including CXR  and CTA chest   I independently visualized and interpreted imaging which showed no acute cardiopulmonary abnormality I agree with the radiologist interpretation   Cardiac Monitoring:  The patient was maintained on a cardiac monitor.  I personally viewed and interpreted the cardiac monitored which showed an underlying rhythm of: NSR   Medicines ordered and prescription drug management:  I have reviewed the patients home medicines and have made adjustments as needed   Test Considered:  US  renal to assess for hydronephrosis   Problem List / ED Course:  As above   Reevaluation:  After the interventions noted above, I reevaluated the patient and found that they have :stayed the same   Social Determinants of Health:  Ambulatory with assist device   Dispostion:  After consideration of the diagnostic results and the patients response to treatment, I feel that the patent would benefit from supportive measures pending outpatient reassessment by PCP. Return precautions discussed and provided. Patient discharged in stable condition with no unaddressed concerns.       Final diagnoses:  Acute right-sided low back pain without sciatica    ED Discharge Orders          Ordered    lidocaine  (LIDODERM ) 5 %  Every 24 hours        04/13/24 0420               Keith Sor, PA-C 04/13/24 0432    Raford Lenis, MD 04/13/24 [phone]

## 2024-04-20 ENCOUNTER — Other Ambulatory Visit (HOSPITAL_BASED_OUTPATIENT_CLINIC_OR_DEPARTMENT_OTHER): Payer: Self-pay

## 2024-04-24 ENCOUNTER — Encounter: Payer: Self-pay | Admitting: Podiatry

## 2024-04-24 ENCOUNTER — Ambulatory Visit (INDEPENDENT_AMBULATORY_CARE_PROVIDER_SITE_OTHER): Admitting: Podiatry

## 2024-04-24 DIAGNOSIS — M79675 Pain in left toe(s): Secondary | ICD-10-CM | POA: Diagnosis not present

## 2024-04-24 DIAGNOSIS — B351 Tinea unguium: Secondary | ICD-10-CM | POA: Diagnosis not present

## 2024-04-24 DIAGNOSIS — E1165 Type 2 diabetes mellitus with hyperglycemia: Secondary | ICD-10-CM | POA: Diagnosis not present

## 2024-04-24 DIAGNOSIS — M79674 Pain in right toe(s): Secondary | ICD-10-CM | POA: Diagnosis not present

## 2024-04-24 NOTE — Progress Notes (Addendum)
 This patient returns to my office for at risk foot care.  This patient requires this care by a professional since this patient will be at risk due to having diabetes.  This patient is unable to cut nails himself since the patient cannot reach his nails.These nails are painful walking and wearing shoes. He presents with caregiver.  He wears braces for his feet due to CVA.This patient presents for at risk foot care today.  General Appearance  Alert, conversant and in no acute stress.  Vascular  Dorsalis pedis and posterior tibial  pulses are palpable  bilaterally.  Capillary return is within normal limits  bilaterally. Temperature is within normal limits  bilaterally.  Neurologic  Senn-Weinstein monofilament wire test within normal limits  bilaterally. Muscle power within normal limits bilaterally.  Nails Thick disfigured discolored nails with subungual debris  from hallux to fifth toes bilaterally. No evidence of bacterial infection or drainage bilaterally.  Orthopedic  No limitations of motion  feet .  No crepitus or effusions noted.  No bony pathology or digital deformities noted.  Skin  normotropic skin with no porokeratosis noted bilaterally.  No signs of infections or ulcers noted.     Onychomycosis  Pain in right toes  Pain in left toes  Consent was obtained for treatment procedures.   Mechanical debridement of nails 1-5  bilaterally performed with a nail nipper.  Filed with dremel without incident.    Return office visit    3 months                  Told patient to return for periodic foot care and evaluation due to potential at risk complications.   Cordella Bold DPM  tomma

## 2024-04-25 ENCOUNTER — Ambulatory Visit: Attending: Family Medicine | Admitting: Family Medicine

## 2024-04-25 ENCOUNTER — Encounter: Payer: Self-pay | Admitting: Family Medicine

## 2024-04-25 VITALS — BP 135/79 | HR 81 | Ht 67.0 in | Wt 135.0 lb

## 2024-04-25 DIAGNOSIS — I69351 Hemiplegia and hemiparesis following cerebral infarction affecting right dominant side: Secondary | ICD-10-CM | POA: Diagnosis not present

## 2024-04-25 DIAGNOSIS — M545 Low back pain, unspecified: Secondary | ICD-10-CM | POA: Diagnosis not present

## 2024-04-25 DIAGNOSIS — Z23 Encounter for immunization: Secondary | ICD-10-CM

## 2024-04-25 NOTE — Patient Instructions (Signed)
Spasticity Spasticity is a condition in which your muscles contract suddenly and unpredictably (spasm). Spasticity usually affects your arms, legs, or back. It can also affect the way you walk. Spasticity can range from mild muscle stiffness and tightness to severe, uncontrollable muscle spasms. Severe spasticity can be painful and can freeze your muscles in an uncomfortable position. Follow these instructions at home: Managing muscle stiffness and spasms     Wear a brace as told by your health care provider to prevent muscle contractions. Have the affected muscles massaged. If directed, apply heat to the affected muscle area. Use the heat source that your health care provider recommends, such as a moist heat pack or heating pad. Place a towel between your skin and the heat source. Leave the heat on for 20-30 minutes. Remove the heat if your skin turns bright red. This is especially important if you are unable to feel pain, heat, or cold. You may have a greater risk of getting burned. If directed, apply ice to the affected muscle area: Put ice in a plastic bag. Place a towel between your skin and the bag or between your brace and the bag. Leave the ice on for 20 minutes, 2?3 times a day. Activity Stay active as directed by your health care provider. Find a safe exercise program that fits your needs and ability. Maintain good posture when walking and sitting. Work with a physical therapist to learn exercises that will stretch and strengthen your muscles. Do stretching and range-of-motion exercises at home as told by a physical therapist. Work with an occupational therapist. This type of health care provider can help you function better at home and at work. If you have severe spasticity, use mobility aids, such as a walker or cane, as told by your health care provider. General instructions Watch your condition for any changes. Wear loose, comfortable clothing that does not restrict your  movement. Wear closed-toe shoes that fit well and support your feet. Wear shoes that have rubber soles or low heels. Take over-the-counter and prescription medicine only as told by your health care provider. Keep all follow-up visits as told by your health care provider. This is important. Contact a health care provider if you: Have worsening muscle spasms. Develop other symptoms along with spasticity. Have a fever or chills. Experience a burning feeling when you pass urine. Become constipated. Need more support at home. Get help right away if you: Have trouble breathing. Have a muscle spasm that freezes you into a painful position. Cannot walk. Cannot care for yourself at home. Have trouble passing urine or have urinary incontinence. Summary Spasticity is a condition in which your muscles contract suddenly and unpredictably (spasm). Spasticity usually affects your arms, legs, or back. Spasticity can range from mild muscle stiffness and tightness to severe, uncontrollable muscle spasms. Do stretching and range-of-motion exercises at home as told by a physical therapist. Take over-the-counter and prescription medicine only as told by your health care provider. This information is not intended to replace advice given to you by your health care provider. Make sure you discuss any questions you have with your health care provider. Document Revised: 07/18/2022 Document Reviewed: 07/18/2022 Elsevier Patient Education  2024 [employer].

## 2024-04-25 NOTE — Progress Notes (Signed)
 Subjective:  Patient ID: Casey Petty, male    DOB: 1961/08/04  Age: 63 y.o. MRN: [id number]  CC: Hospitalization Follow-up     Discussed the use of AI scribe software for clinical note transcription with the patient, who gave verbal consent to proceed.  History of Present Illness Casey Petty is a 63 year old male with a history of type 2 diabetes mellitus (A1c 6.6) stroke (with right spastic hemiparesis s/p Botox injections by rehab), right adhesive capsulitis, dysarthria, Hypertension, Nicotine  dependence (3 Cigarettes /day, <20 pack years) and peripheral arterial disease  who presents with right-sided back pain.  He has experienced right-sided back pain for one to two weeks, currently rated as seven out of ten. He initially sought care at the emergency room where massage provided some relief. He is uncertain if the pain is related to his sleeping position.  He is taking baclofen , which helps with the pain. He has not been referred to physical therapy and prefers to manage the pain with home exercises, massage, and a heating pad.  During the emergency room visit, chest x-ray revealed no acute process, CTA chest was negative for PE, revealed coronary artery disease and aortic atherosclerosis. He has right-sided weakness from a previous stroke.    Past Medical History:  Diagnosis Date   Diabetes mellitus without complication (HCC)    on meds   Hyperlipidemia    on meds   Hypertension    on meds   Neuromuscular disorder (HCC)    Stroke (HCC) 2020   R hemiparesis-abnormal gait    Past Surgical History:  Procedure Laterality Date   WISDOM TOOTH EXTRACTION Bilateral 06/2021    Family History  Problem Relation Age of Onset   Heart attack Father    Cancer Neg Hx    Colon polyps Neg Hx    Colon cancer Neg Hx    Esophageal cancer Neg Hx    Stomach cancer Neg Hx    Rectal cancer Neg Hx     Social History   Socioeconomic History   Marital status: Single    Spouse  name: Not on file   Number of children: Not on file   Years of education: Not on file   Highest education level: Not on file  Occupational History   Not on file  Tobacco Use   Smoking status: Every Day    Current packs/day: 0.25    Types: Cigarettes   Smokeless tobacco: Never   Tobacco comments:    Smoking 4 cigs/day  Vaping Use   Vaping status: Never Used  Substance and Sexual Activity   Alcohol use: Yes    Comment: 2 times a week - beer   Drug use: No   Sexual activity: Never  Other Topics Concern   Not on file  Social History Narrative   Not on file   Social Drivers of Health   Financial Resource Strain: Low Risk  (01/16/2024)   Overall Financial Resource Strain (CARDIA)    Difficulty of Paying Living Expenses: Not hard at all  Food Insecurity: No Food Insecurity (01/16/2024)   Hunger Vital Sign    Worried About Running Out of Food in the Last Year: Never true    Ran Out of Food in the Last Year: Never true  Transportation Needs: No Transportation Needs (01/16/2024)   PRAPARE - Administrator, Civil Service (Medical): No    Lack of Transportation (Non-Medical): No  Physical Activity: Insufficiently Active (01/16/2024)   Exercise Vital  Sign    Days of Exercise per Week: 3 days    Minutes of Exercise per Session: 30 min  Stress: No Stress Concern Present (01/16/2024)   [employer] of Occupational Health - Occupational Stress Questionnaire    Feeling of Stress : Not at all  Social Connections: Socially Integrated (01/16/2024)   Social Connection and Isolation Panel    Frequency of Communication with Friends and Family: More than three times a week    Frequency of Social Gatherings with Friends and Family: More than three times a week    Attends Religious Services: More than 4 times per year    Active Member of [employer] or Organizations: Yes    Attends Banker Meetings: 1 to 4 times per year    Marital Status: Married    Allergies  Allergen  Reactions   Lisinopril  Swelling    Angioedema- lips/face    Outpatient Medications Prior to Visit  Medication Sig Dispense Refill   amLODipine  (NORVASC ) 10 MG tablet TAKE ONE TABLET BY MOUTH DAILY AT 9 AM EVERY MORNING FOR BLOOD PRESSURE 90 tablet 1   aspirin  EC 81 MG tablet Take 1 tablet (81 mg total) by mouth daily. 100 tablet 0   baclofen  (LIORESAL ) 10 MG tablet Take 1 tablet (10 mg total) by mouth 3 (three) times daily. 270 tablet 1   BD PEN NEEDLE NANO ULTRAFINE 32G X 4 MM MISC USE TO INJECT INSULIN  ONCE DAILY 100 each 2   Blood Glucose Monitoring Suppl (ACCU-CHEK GUIDE) w/Device KIT 1 each by Does not apply route in the morning, at noon, in the evening, and at bedtime. 1 kit 0   Blood Glucose Monitoring Suppl (TRUE METRIX METER) w/Device KIT Check blood sugar fasting and before meals and again if pt feels bad (symptoms of hypo). 1 kit 0   buPROPion  (WELLBUTRIN  XL) 150 MG 24 hr tablet Take 1 tablet (150 mg total) by mouth daily. For smoking cessation 90 tablet 1   carvedilol  (COREG ) 12.5 MG tablet TAKE ONE TABLET BY MOUTH TWICE DAILY AT 9AM & 5PM EVERY MORNING AND EVERY EVENING 180 tablet 1   Continuous Blood Gluc Receiver (DEXCOM G6 RECEIVER) DEVI Use to check blood sugar TID. 1 each 0   Continuous Blood Gluc Sensor (DEXCOM G6 SENSOR) MISC Use to check blood sugar TID. 3 each 3   Continuous Blood Gluc Transmit (DEXCOM G6 TRANSMITTER) MISC Use to check blood sugar TID. 1 each 3   empagliflozin  (JARDIANCE ) 25 MG TABS tablet Take 1 tablet (25 mg total) by mouth daily. 90 tablet 1   ezetimibe  (ZETIA ) 10 MG tablet Take 1 tablet (10 mg total) by mouth daily. 90 tablet 1   glucose blood (ACCU-CHEK GUIDE) test strip Use as instructed up to 4 times daily 100 each 12   glucose blood (TRUE METRIX BLOOD GLUCOSE TEST) test strip Use as instructed before every meal nightly up to 4 times daily 100 each 12   hydrALAZINE  (APRESOLINE ) 100 MG tablet Take 1 tablet (100 mg total) by mouth 3 (three) times  daily. 270 tablet 1   insulin  glargine (LANTUS  SOLOSTAR) 100 UNIT/ML Solostar Pen INJECT 8 UNITS UNDER THE SKIN DAILY. (DISCARD PEN 28 DAYS AFTER OPENING) 30 mL 2   lidocaine  (LIDODERM ) 5 % Place 1 patch onto the skin daily. Remove & Discard patch within 12 hours or as directed by MD 30 patch 0   Misc. Devices MISC Wheelchair with Accessories: elevating leg rests (ELRs), wheel locks, extensions and anti-tippers.  Cane or walker will not suffice 1 each 0   Misc. Devices MISC Rollator walker with seat.  Diagnosis - Stroke 1 each 0   rosuvastatin  (CRESTOR ) 40 MG tablet Take 1 tablet (40 mg total) by mouth daily. 90 tablet 1   TRUEplus Lancets 28G MISC Check blood sugar before every meal, nightly up to 4 times a day. 100 each 12   UREA  10 HYDRATING 10 % cream APPLY TO AFFECTED AREA EVERY DAY AS NEEDED 85 g 0   No facility-administered medications prior to visit.     ROS Review of Systems  Constitutional:  Negative for activity change and appetite change.  HENT:  Negative for sinus pressure and sore throat.   Respiratory:  Negative for chest tightness, shortness of breath and wheezing.   Cardiovascular:  Negative for chest pain and palpitations.  Gastrointestinal:  Negative for abdominal distention, abdominal pain and constipation.  Genitourinary: Negative.   Musculoskeletal:  Positive for back pain.  Psychiatric/Behavioral:  Negative for behavioral problems and dysphoric mood.     Objective:  BP 135/79   Pulse 81   Ht 5' 7 (1.702 m)   Wt 135 lb (61.2 kg)   SpO2 100%   BMI 21.14 kg/m      04/25/2024   10:38 AM 04/25/2024    9:39 AM 04/13/2024    5:00 AM  BP/Weight  Systolic BP 135 147 157  Diastolic BP 79 81 90  Wt. (Lbs)  135   BMI  21.14 kg/m2       Physical Exam Constitutional:      Appearance: He is well-developed.  Cardiovascular:     Rate and Rhythm: Normal rate.     Heart sounds: Normal heart sounds. No murmur heard. Pulmonary:     Effort: Pulmonary effort is  normal.     Breath sounds: Normal breath sounds. No wheezing or rales.  Chest:     Chest wall: No tenderness.  Abdominal:     General: Bowel sounds are normal. There is no distension.     Palpations: Abdomen is soft. There is no mass.     Tenderness: There is no abdominal tenderness.  Musculoskeletal:     Right lower leg: No edema.     Left lower leg: No edema.     Comments: No tenderness on palpation of entire spine and bilateral thoracolumbar muscles  Neurological:     Mental Status: He is alert and oriented to person, place, and time.     Comments: Right upper and lower extremity spasticity  Psychiatric:        Mood and Affect: Mood normal.        Latest Ref Rng & Units 04/12/2024   10:34 PM 03/20/2024    9:36 AM 08/10/2023    9:27 AM  CMP  Glucose 70 - 99 mg/dL 887  75  83   BUN 6 - 20 mg/dL 5  3  7    Creatinine 0.61 - 1.24 mg/dL 9.00  9.18  9.10   Sodium 135 - 145 mmol/L 137  141  139   Potassium 3.5 - 5.1 mmol/L 4.2  4.3  4.6   Chloride 98 - 111 mmol/L 100  101  102   CO2 22 - 32 mmol/L 22  21  21    Calcium  8.9 - 10.3 mg/dL 9.3  9.6  9.9   Total Protein 6.5 - 8.1 g/dL 7.5  7.0  7.1   Total Bilirubin 0.0 - 1.2 mg/dL 0.8  0.3  0.4   Alkaline Phos 38 - 126 U/L 110  108  108   AST 15 - 41 U/L 29  30  80   ALT 0 - 44 U/L 18  15  56     Lipid Panel     Component Value Date/Time   CHOL 161 03/20/2024 0936   TRIG 117 03/20/2024 0936   HDL 54 03/20/2024 0936   CHOLHDL 3.2 08/31/2021 0843   CHOLHDL 3.5 07/18/2014 0922   VLDL NOT CALC 07/18/2014 0922   LDLCALC 86 03/20/2024 0936    CBC    Component Value Date/Time   WBC 9.4 04/12/2024 2234   RBC 5.17 04/12/2024 2234   HGB 16.9 04/12/2024 2234   HCT 51.1 04/12/2024 2234   PLT 289 04/12/2024 2234   MCV 98.8 04/12/2024 2234   MCH 32.7 04/12/2024 2234   MCHC 33.1 04/12/2024 2234   RDW 14.2 04/12/2024 2234   LYMPHSABS 2.4 04/12/2024 2234   MONOABS 0.8 04/12/2024 2234   EOSABS 0.5 04/12/2024 2234   BASOSABS 0.1  04/12/2024 2234    Lab Results  Component Value Date   HGBA1C 5.6 03/20/2024       Assessment & Plan Acute low back pain, right side Right-sided low back pain likely musculoskeletal, possibly related to posture and previous stroke. - Continue baclofen . - Use heating pad and massage. - Consider physical therapy referral if needed but he declines referral at this time.  Right hemiparesis as late effect of CVA Residual weakness and spasticity may affect posture and contribute to low back pain. - Encourage posture-improving exercises.  General Health Maintenance Reviewed immunizations and blood pressure. - Administer flu shot. - Repeat blood pressure measurement.     Healthcare maintenance Encounter for immunization against influenza-flu shot administered  No orders of the defined types were placed in this encounter.   Follow-up: Return for previously scheduled appointment.       Corrina Sabin, MD, FAAFP. [facility] [place], [place] [phone]   04/25/2024, 1:03 PM

## 2024-06-13 DIAGNOSIS — E119 Type 2 diabetes mellitus without complications: Secondary | ICD-10-CM | POA: Diagnosis not present

## 2024-06-13 DIAGNOSIS — I69351 Hemiplegia and hemiparesis following cerebral infarction affecting right dominant side: Secondary | ICD-10-CM | POA: Diagnosis not present

## 2024-06-13 DIAGNOSIS — I10 Essential (primary) hypertension: Secondary | ICD-10-CM | POA: Diagnosis not present

## 2024-06-13 DIAGNOSIS — F325 Major depressive disorder, single episode, in full remission: Secondary | ICD-10-CM | POA: Diagnosis not present

## 2024-06-13 DIAGNOSIS — Z7984 Long term (current) use of oral hypoglycemic drugs: Secondary | ICD-10-CM | POA: Diagnosis not present

## 2024-06-13 DIAGNOSIS — E785 Hyperlipidemia, unspecified: Secondary | ICD-10-CM | POA: Diagnosis not present

## 2024-06-13 DIAGNOSIS — Z833 Family history of diabetes mellitus: Secondary | ICD-10-CM | POA: Diagnosis not present

## 2024-06-13 DIAGNOSIS — Z8249 Family history of ischemic heart disease and other diseases of the circulatory system: Secondary | ICD-10-CM | POA: Diagnosis not present

## 2024-06-13 DIAGNOSIS — G8929 Other chronic pain: Secondary | ICD-10-CM | POA: Diagnosis not present

## 2024-07-12 ENCOUNTER — Encounter: Attending: Physical Medicine & Rehabilitation | Admitting: Physical Medicine & Rehabilitation

## 2024-07-12 DIAGNOSIS — G8111 Spastic hemiplegia affecting right dominant side: Secondary | ICD-10-CM | POA: Insufficient documentation

## 2024-07-24 ENCOUNTER — Encounter: Payer: Self-pay | Admitting: Podiatry

## 2024-07-24 ENCOUNTER — Ambulatory Visit: Admitting: Podiatry

## 2024-07-24 DIAGNOSIS — E1165 Type 2 diabetes mellitus with hyperglycemia: Secondary | ICD-10-CM

## 2024-07-24 DIAGNOSIS — B351 Tinea unguium: Secondary | ICD-10-CM

## 2024-07-24 DIAGNOSIS — M79674 Pain in right toe(s): Secondary | ICD-10-CM | POA: Diagnosis not present

## 2024-07-24 DIAGNOSIS — M79675 Pain in left toe(s): Secondary | ICD-10-CM

## 2024-07-24 NOTE — Progress Notes (Signed)
 This patient returns to my office for at risk foot care.  This patient requires this care by a professional since this patient will be at risk due to having diabetes.  This patient is unable to cut nails himself since the patient cannot reach his nails.These nails are painful walking and wearing shoes. He presents with caregiver.  He wears braces for his feet due to CVA.This patient presents for at risk foot care today.  General Appearance  Alert, conversant and in no acute stress.  Vascular  Dorsalis pedis and posterior tibial  pulses are palpable  bilaterally.  Capillary return is within normal limits  bilaterally. Temperature is within normal limits  bilaterally.  Neurologic  Senn-Weinstein monofilament wire test within normal limits  bilaterally. Muscle power within normal limits bilaterally.  Nails Thick disfigured discolored nails with subungual debris  from hallux to fifth toes bilaterally. No evidence of bacterial infection or drainage bilaterally.  Orthopedic  No limitations of motion  feet .  No crepitus or effusions noted.  No bony pathology or digital deformities noted.  Skin  normotropic skin with no porokeratosis noted bilaterally.  No signs of infections or ulcers noted.     Onychomycosis  Pain in right toes  Pain in left toes  Consent was obtained for treatment procedures.   Mechanical debridement of nails 1-5  bilaterally performed with a nail nipper.  Filed with dremel without incident.    Return office visit    3 months                  Told patient to return for periodic foot care and evaluation due to potential at risk complications.   Cordella Bold DPM  tomma

## 2024-08-05 ENCOUNTER — Telehealth: Payer: Self-pay | Admitting: Family Medicine

## 2024-08-05 NOTE — Telephone Encounter (Signed)
Awaiting on the fax

## 2024-08-05 NOTE — Telephone Encounter (Unsigned)
 Copied from CRM #[id number]. Topic: Appointments - Appointment Cancel/Reschedule >> Aug 05, 2024  4:15 PM Rea ORN wrote: Pt wife Arne called to advise pt needs to be seen sooner than 2/16. Arne said [facility] called the pt and said he needed an appt in order to continue having an aid.   Please call Arne back , [phone], to advise availability. Pt appt was also added to the wait list.

## 2024-08-05 NOTE — Telephone Encounter (Signed)
 Copied from CRM [id number]. Topic: Referral - Question >> Aug 05, 2024  3:52 PM Delon DASEN wrote:  Reason for CRM: [facility] with [facility] calling about patient wanting to extend pcs services- sending referral by fax

## 2024-08-13 NOTE — Telephone Encounter (Signed)
 Received PCS request that was dropped off at this office.  There is no name of who dropped it off but there is a number : [phone] on the form to call when it is completed.  I called that number and there was a voicemail but no identifying information, so I did not leave a message.   I called the patient and spoke to him as well as his significant other, Arne.  They explained that he currently receives PS 1 hour/day x 5 days/ week through [facility].  They understand that he needs to be seen by the provider in order to complete this form because it has been more than 90 days since he was seen.  I was able to schedule him with Dr Newlin 08/27/2024.   After he is seen by Dr Delbert, the form will be completed and faxed to [facility]: [phone].

## 2024-08-26 ENCOUNTER — Telehealth: Payer: Self-pay | Admitting: Family Medicine

## 2024-08-26 NOTE — Telephone Encounter (Signed)
 Contacted pt confirmed appt

## 2024-08-27 ENCOUNTER — Encounter: Payer: Self-pay | Admitting: Family Medicine

## 2024-08-27 ENCOUNTER — Ambulatory Visit: Payer: Self-pay | Attending: Family Medicine | Admitting: Family Medicine

## 2024-08-27 ENCOUNTER — Other Ambulatory Visit: Payer: Self-pay

## 2024-08-27 VITALS — BP 138/78 | HR 90 | Temp 98.3°F | Ht 67.0 in | Wt 146.8 lb

## 2024-08-27 DIAGNOSIS — I6930 Unspecified sequelae of cerebral infarction: Secondary | ICD-10-CM | POA: Diagnosis not present

## 2024-08-27 DIAGNOSIS — E1169 Type 2 diabetes mellitus with other specified complication: Secondary | ICD-10-CM

## 2024-08-27 DIAGNOSIS — I152 Hypertension secondary to endocrine disorders: Secondary | ICD-10-CM

## 2024-08-27 DIAGNOSIS — M1711 Unilateral primary osteoarthritis, right knee: Secondary | ICD-10-CM

## 2024-08-27 DIAGNOSIS — I69351 Hemiplegia and hemiparesis following cerebral infarction affecting right dominant side: Secondary | ICD-10-CM | POA: Diagnosis not present

## 2024-08-27 DIAGNOSIS — E1159 Type 2 diabetes mellitus with other circulatory complications: Secondary | ICD-10-CM | POA: Diagnosis not present

## 2024-08-27 DIAGNOSIS — Z794 Long term (current) use of insulin: Secondary | ICD-10-CM | POA: Diagnosis not present

## 2024-08-27 DIAGNOSIS — Z7984 Long term (current) use of oral hypoglycemic drugs: Secondary | ICD-10-CM

## 2024-08-27 LAB — POCT GLYCOSYLATED HEMOGLOBIN (HGB A1C): HbA1c, POC (controlled diabetic range): 7.7 % — AB (ref 0.0–7.0)

## 2024-08-27 MED ORDER — HYDRALAZINE HCL 100 MG PO TABS
100.0000 mg | ORAL_TABLET | Freq: Three times a day (TID) | ORAL | 1 refills | Status: AC
  Filled 2024-08-27: qty 270, 90d supply, fill #0

## 2024-08-27 MED ORDER — AMLODIPINE BESYLATE 10 MG PO TABS
10.0000 mg | ORAL_TABLET | Freq: Every morning | ORAL | 1 refills | Status: AC
  Filled 2024-08-27: qty 90, 90d supply, fill #0

## 2024-08-27 MED ORDER — EMPAGLIFLOZIN 25 MG PO TABS
25.0000 mg | ORAL_TABLET | Freq: Every day | ORAL | 1 refills | Status: AC
  Filled 2024-08-27: qty 90, 90d supply, fill #0

## 2024-08-27 MED ORDER — EZETIMIBE 10 MG PO TABS
10.0000 mg | ORAL_TABLET | Freq: Every day | ORAL | 1 refills | Status: AC
  Filled 2024-08-27: qty 90, 90d supply, fill #0

## 2024-08-27 MED ORDER — GLIPIZIDE ER 2.5 MG PO TB24
2.5000 mg | ORAL_TABLET | Freq: Every day | ORAL | 1 refills | Status: AC
  Filled 2024-08-27: qty 90, 90d supply, fill #0

## 2024-08-27 MED ORDER — ROSUVASTATIN CALCIUM 40 MG PO TABS
40.0000 mg | ORAL_TABLET | Freq: Every day | ORAL | 1 refills | Status: AC
  Filled 2024-08-27: qty 90, 90d supply, fill #0

## 2024-08-27 MED ORDER — CARVEDILOL 12.5 MG PO TABS
12.5000 mg | ORAL_TABLET | Freq: Two times a day (BID) | ORAL | 1 refills | Status: AC
  Filled 2024-08-27: qty 180, 90d supply, fill #0

## 2024-08-27 MED ORDER — BACLOFEN 10 MG PO TABS
10.0000 mg | ORAL_TABLET | Freq: Three times a day (TID) | ORAL | 1 refills | Status: AC
  Filled 2024-08-27: qty 270, 90d supply, fill #0

## 2024-08-27 NOTE — Patient Instructions (Signed)
 VISIT SUMMARY:  During your visit, we discussed your right knee pain, diabetes management, and medication adherence. We also reviewed your current medications for hypertension and cholesterol, and your right-sided weakness from a previous stroke.  YOUR PLAN:  -TYPE 2 DIABETES MELLITUS: Type 2 diabetes is a condition where your body does not use insulin  properly, leading to high blood sugar levels. Your A1c has increased to 7.7, indicating that your blood sugar control needs improvement. We have discontinued Lantus  and started you on Glucotrol  2.5 mg once daily along with continuing Jardiance  to help manage your blood sugar levels.  -UNILATERAL PRIMARY OSTEOARTHRITIS, RIGHT KNEE: Osteoarthritis is a condition that causes joint pain and stiffness. Your right knee pain and swelling are likely due to arthritis, which may be worsened by the weather. We have referred you to orthopedics for an evaluation and possible knee injection to help relieve your symptoms.  -RIGHT HEMIPARESIS AS LATE EFFECT OF CVA: Right hemiparesis is weakness on one side of the body due to a previous stroke. This condition limits your mobility and contributes to knee stiffness. We will continue with your current management and support for this condition.  -HYPERLIPIDEMIA: Hyperlipidemia is a condition where you have high levels of fats (lipids) in your blood. You are currently managing this with rosuvastatin  and ezetimibe , and we will continue with these medications.  INSTRUCTIONS:  Please follow up with orthopedics for your knee evaluation and possible injection. Continue taking your medications as prescribed and monitor your blood sugar levels regularly. If you experience any new symptoms or have concerns, please contact our office.

## 2024-08-27 NOTE — Progress Notes (Signed)
 "  Subjective:  Patient ID: Casey Petty, male    DOB: [birth year]  Age: 64 y.o. MRN: [id number]  CC: Medical Management of Chronic Issues (Right knee pain X1 week. /)     Discussed the use of AI scribe software for clinical note transcription with the patient, who gave verbal consent to proceed.  History of Present Illness Casey Petty is a 64 year old male with a history of type 2 diabetes mellitus, stroke (with right spastic hemiparesis s/p Botox injections by rehab), right adhesive capsulitis, dysarthria, Hypertension, Nicotine  dependence (3 Cigarettes /day, <20 pack years) and peripheral arterial disease  who presents with right knee pain and medication management issues.  He has had right knee pain for one week with swelling. Epsom salt soaks give partial relief. X-ray of the right knee from 2020 revealed tricompartmental osteoarthritis without acute osseous abnormality.  Of note he does have right hemiparesis and wears a right AFO brace.  His diabetes control has worsened, with A1c increasing from 5.6 to 7.7 over the last three months. He takes Jardiance . Lantus  insulin  was discontinued per patient however from my last month I had advised him to decrease his dose from 8 units to 6 units to prevent hypoglycemia.  Metformin  caused gastrointestinal side effects in the past  He takes carvedilol , amlodipine , and hydralazine  for hypertension, and rosuvastatin  and ezetimibe  for cholesterol. He has not been taking prescribed bupropion  for smoking cessation.  He has right-sided weakness after a stroke and receives help from an aide five times a week for daily activities and support with care.  He needs recertification for his PCS services. Accompanied by wife to this visit.    Past Medical History:  Diagnosis Date   Diabetes mellitus without complication (HCC)    on meds   Hyperlipidemia    on meds   Hypertension    on meds   Neuromuscular disorder (HCC)    Stroke (HCC) 2020   R  hemiparesis-abnormal gait    Past Surgical History:  Procedure Laterality Date   WISDOM TOOTH EXTRACTION Bilateral 06/2021    Family History  Problem Relation Age of Onset   Heart attack Father    Cancer Neg Hx    Colon polyps Neg Hx    Colon cancer Neg Hx    Esophageal cancer Neg Hx    Stomach cancer Neg Hx    Rectal cancer Neg Hx     Social History   Socioeconomic History   Marital status: Single    Spouse name: Not on file   Number of children: Not on file   Years of education: Not on file   Highest education level: Not on file  Occupational History   Not on file  Tobacco Use   Smoking status: Every Day    Current packs/day: 0.25    Types: Cigarettes   Smokeless tobacco: Never   Tobacco comments:    Smoking 4 cigs/day  Vaping Use   Vaping status: Never Used  Substance and Sexual Activity   Alcohol use: Yes    Comment: 2 times a week - beer   Drug use: No   Sexual activity: Never  Other Topics Concern   Not on file  Social History Narrative   Not on file   Social Drivers of Health   Tobacco Use: High Risk (07/24/2024)   Patient History    Smoking Tobacco Use: Every Day    Smokeless Tobacco Use: Never    Passive Exposure: Not on file  Financial Resource Strain: Low Risk (01/16/2024)   Overall Financial Resource Strain (CARDIA)    Difficulty of Paying Living Expenses: Not hard at all  Food Insecurity: No Food Insecurity (01/16/2024)   Hunger Vital Sign    Worried About Running Out of Food in the Last Year: Never true    Ran Out of Food in the Last Year: Never true  Transportation Needs: No Transportation Needs (01/16/2024)   PRAPARE - Administrator, Civil Service (Medical): No    Lack of Transportation (Non-Medical): No  Physical Activity: Insufficiently Active (01/16/2024)   Exercise Vital Sign    Days of Exercise per Week: 3 days    Minutes of Exercise per Session: 30 min  Stress: No Stress Concern Present (01/16/2024)   [employer] - Occupational Stress Questionnaire    Feeling of Stress : Not at all  Social Connections: Socially Integrated (01/16/2024)   Social Connection and Isolation Panel    Frequency of Communication with Friends and Family: More than three times a week    Frequency of Social Gatherings with Friends and Family: More than three times a week    Attends Religious Services: More than 4 times per year    Active Member of Clubs or Organizations: Yes    Attends Banker Meetings: 1 to 4 times per year    Marital Status: Married  Depression (PHQ2-9): Low Risk (04/25/2024)   Depression (PHQ2-9)    PHQ-2 Score: 0  Alcohol Screen: Low Risk (01/16/2024)   Alcohol Screen    Last Alcohol Screening Score (AUDIT): 2  Housing: Low Risk (01/16/2024)   Housing Stability Vital Sign    Unable to Pay for Housing in the Last Year: No    Number of Times Moved in the Last Year: 0    Homeless in the Last Year: No  Utilities: Not At Risk (01/16/2024)   AHC Utilities    Threatened with loss of utilities: No  Health Literacy: Adequate Health Literacy (01/16/2024)   B1300 Health Literacy    Frequency of need for help with medical instructions: Rarely    Allergies[1]  Outpatient Medications Prior to Visit  Medication Sig Dispense Refill   aspirin  EC 81 MG tablet Take 1 tablet (81 mg total) by mouth daily. 100 tablet 0   Misc. Devices MISC Rollator walker with seat.  Diagnosis - Stroke 1 each 0   amLODipine  (NORVASC ) 10 MG tablet TAKE ONE TABLET BY MOUTH DAILY AT 9 AM EVERY MORNING FOR BLOOD PRESSURE 90 tablet 1   baclofen  (LIORESAL ) 10 MG tablet Take 1 tablet (10 mg total) by mouth 3 (three) times daily. 270 tablet 1   carvedilol  (COREG ) 12.5 MG tablet TAKE ONE TABLET BY MOUTH TWICE DAILY AT 9AM & 5PM EVERY MORNING AND EVERY EVENING 180 tablet 1   empagliflozin  (JARDIANCE ) 25 MG TABS tablet Take 1 tablet (25 mg total) by mouth daily. 90 tablet 1   BD PEN NEEDLE NANO ULTRAFINE  32G X 4 MM MISC USE TO INJECT INSULIN  ONCE DAILY (Patient not taking: Reported on 08/27/2024) 100 each 2   Blood Glucose Monitoring Suppl (ACCU-CHEK GUIDE) w/Device KIT 1 each by Does not apply route in the morning, at noon, in the evening, and at bedtime. (Patient not taking: Reported on 08/27/2024) 1 kit 0   Blood Glucose Monitoring Suppl (TRUE METRIX METER) w/Device KIT Check blood sugar fasting and before meals and again if pt feels bad (symptoms of hypo). (Patient not  taking: Reported on 08/27/2024) 1 kit 0   Continuous Blood Gluc Receiver (DEXCOM G6 RECEIVER) DEVI Use to check blood sugar TID. (Patient not taking: Reported on 08/27/2024) 1 each 0   Continuous Blood Gluc Sensor (DEXCOM G6 SENSOR) MISC Use to check blood sugar TID. (Patient not taking: Reported on 08/27/2024) 3 each 3   Continuous Blood Gluc Transmit (DEXCOM G6 TRANSMITTER) MISC Use to check blood sugar TID. (Patient not taking: Reported on 08/27/2024) 1 each 3   glucose blood (ACCU-CHEK GUIDE) test strip Use as instructed up to 4 times daily (Patient not taking: Reported on 08/27/2024) 100 each 12   glucose blood (TRUE METRIX BLOOD GLUCOSE TEST) test strip Use as instructed before every meal nightly up to 4 times daily (Patient not taking: Reported on 08/27/2024) 100 each 12   lidocaine  (LIDODERM ) 5 % Place 1 patch onto the skin daily. Remove & Discard patch within 12 hours or as directed by MD (Patient not taking: Reported on 08/27/2024) 30 patch 0   Misc. Devices MISC Wheelchair with Accessories: elevating leg rests (ELRs), wheel locks, extensions and anti-tippers. Cane or walker will not suffice (Patient not taking: Reported on 08/27/2024) 1 each 0   TRUEplus Lancets 28G MISC Check blood sugar before every meal, nightly up to 4 times a day. (Patient not taking: Reported on 08/27/2024) 100 each 12   UREA  10 HYDRATING 10 % cream APPLY TO AFFECTED AREA EVERY DAY AS NEEDED (Patient not taking: Reported on 08/27/2024) 85 g 0   buPROPion   (WELLBUTRIN  XL) 150 MG 24 hr tablet Take 1 tablet (150 mg total) by mouth daily. For smoking cessation (Patient not taking: Reported on 08/27/2024) 90 tablet 1   ezetimibe  (ZETIA ) 10 MG tablet Take 1 tablet (10 mg total) by mouth daily. (Patient not taking: Reported on 08/27/2024) 90 tablet 1   hydrALAZINE  (APRESOLINE ) 100 MG tablet Take 1 tablet (100 mg total) by mouth 3 (three) times daily. (Patient not taking: Reported on 08/27/2024) 270 tablet 1   insulin  glargine (LANTUS  SOLOSTAR) 100 UNIT/ML Solostar Pen INJECT 8 UNITS UNDER THE SKIN DAILY. (DISCARD PEN 28 DAYS AFTER OPENING) (Patient not taking: Reported on 08/27/2024) 30 mL 2   rosuvastatin  (CRESTOR ) 40 MG tablet Take 1 tablet (40 mg total) by mouth daily. (Patient not taking: Reported on 08/27/2024) 90 tablet 1   No facility-administered medications prior to visit.     ROS Review of Systems  Constitutional:  Negative for activity change and appetite change.  HENT:  Negative for sinus pressure and sore throat.   Respiratory:  Negative for chest tightness, shortness of breath and wheezing.   Cardiovascular:  Negative for chest pain and palpitations.  Gastrointestinal:  Negative for abdominal distention, abdominal pain and constipation.  Genitourinary: Negative.   Musculoskeletal:        See HPI  Neurological:  Positive for weakness.  Psychiatric/Behavioral:  Negative for behavioral problems and dysphoric mood.     Objective:  BP 138/78   Pulse 90   Temp 98.3 F (36.8 C) (Oral)   Ht 5' 7 (1.702 m)   Wt 146 lb 12.8 oz (66.6 kg)   SpO2 98%   BMI 22.99 kg/m      08/27/2024    2:42 PM 04/25/2024   10:38 AM 04/25/2024    9:39 AM  BP/Weight  Systolic BP 138 135 147  Diastolic BP 78 79 81  Wt. (Lbs) 146.8  135  BMI 22.99 kg/m2  21.14 kg/m2  Physical Exam Constitutional:      Appearance: He is well-developed.  Cardiovascular:     Rate and Rhythm: Normal rate.     Heart sounds: Normal heart sounds. No murmur  heard. Pulmonary:     Effort: Pulmonary effort is normal.     Breath sounds: Normal breath sounds. No wheezing or rales.  Chest:     Chest wall: No tenderness.  Abdominal:     General: Bowel sounds are normal. There is no distension.     Palpations: Abdomen is soft. There is no mass.     Tenderness: There is no abdominal tenderness.  Musculoskeletal:     Right lower leg: No edema.     Left lower leg: No edema.     Comments: Slight edema of right knee with associated tenderness and range of motion, severely decreased range of motion with spasticity  Neurological:     Mental Status: He is alert and oriented to person, place, and time.     Comments: Right-sided spasticity in right upper and right lower extremity  Psychiatric:        Mood and Affect: Mood normal.        Latest Ref Rng & Units 04/12/2024   10:34 PM 03/20/2024    9:36 AM 08/10/2023    9:27 AM  CMP  Glucose 70 - 99 mg/dL 887  75  83   BUN 6 - 20 mg/dL 5  3  7    Creatinine 0.61 - 1.24 mg/dL 9.00  9.18  9.10   Sodium 135 - 145 mmol/L 137  141  139   Potassium 3.5 - 5.1 mmol/L 4.2  4.3  4.6   Chloride 98 - 111 mmol/L 100  101  102   CO2 22 - 32 mmol/L 22  21  21    Calcium  8.9 - 10.3 mg/dL 9.3  9.6  9.9   Total Protein 6.5 - 8.1 g/dL 7.5  7.0  7.1   Total Bilirubin 0.0 - 1.2 mg/dL 0.8  0.3  0.4   Alkaline Phos 38 - 126 U/L 110  108  108   AST 15 - 41 U/L 29  30  80   ALT 0 - 44 U/L 18  15  56     Lipid Panel     Component Value Date/Time   CHOL 161 03/20/2024 0936   TRIG 117 03/20/2024 0936   HDL 54 03/20/2024 0936   CHOLHDL 3.2 08/31/2021 0843   CHOLHDL 3.5 07/18/2014 0922   VLDL NOT CALC 07/18/2014 0922   LDLCALC 86 03/20/2024 0936    CBC    Component Value Date/Time   WBC 9.4 04/12/2024 2234   RBC 5.17 04/12/2024 2234   HGB 16.9 04/12/2024 2234   HCT 51.1 04/12/2024 2234   PLT 289 04/12/2024 2234   MCV 98.8 04/12/2024 2234   MCH 32.7 04/12/2024 2234   MCHC 33.1 04/12/2024 2234   RDW 14.2  04/12/2024 2234   LYMPHSABS 2.4 04/12/2024 2234   MONOABS 0.8 04/12/2024 2234   EOSABS 0.5 04/12/2024 2234   BASOSABS 0.1 04/12/2024 2234    Lab Results  Component Value Date   HGBA1C 7.7 (A) 08/27/2024    .    Assessment & Plan Type 2 diabetes mellitus A1c increased to 7.7, indicating suboptimal glycemic control.  Goal is less than 7.   Metformin  not tolerated in the past. - Glucotrol  XL chosen to improve control. - Discontinued Lantus . - Initiated Glucotrol  XL 2.5 mg orally once daily  with Jardiance .  Unilateral primary osteoarthritis, right knee Right knee pain and swelling in the setting of right sided spasticity due to stroke likely exacerbated by weather. Previous x-ray confirmed arthritis. Open to knee injection. - Referred to orthopedics for evaluation and possible knee injection.  Right hemiparesis as late effect of CVA Right-sided weakness limits mobility and contributes to knee stiffness. - Continue current management and support for right-sided weakness. -Continue right AFO brace - Continue with Botox injections from physical medicine and rehab - Continue rosuvastatin  and ezetimibe . - Low-cholesterol diet -LDL of 86 slightly above goal of less than 70 - Will check lipid panel at next visit - Secondary risk factor modification      Meds ordered this encounter  Medications   glipiZIDE  (GLUCOTROL  XL) 2.5 MG 24 hr tablet    Sig: Take 1 tablet (2.5 mg total) by mouth daily with breakfast.    Dispense:  90 tablet    Refill:  1    Discontinue Glipizide    amLODipine  (NORVASC ) 10 MG tablet    Sig: TAKE ONE TABLET BY MOUTH DAILY AT 9 AM EVERY MORNING FOR BLOOD PRESSURE    Dispense:  90 tablet    Refill:  1   baclofen  (LIORESAL ) 10 MG tablet    Sig: Take 1 tablet (10 mg total) by mouth 3 (three) times daily.    Dispense:  270 tablet    Refill:  1   carvedilol  (COREG ) 12.5 MG tablet    Sig: Take 1 tablet (12.5 mg total) by mouth 2 (two) times daily with a  meal.    Dispense:  180 tablet    Refill:  1   empagliflozin  (JARDIANCE ) 25 MG TABS tablet    Sig: Take 1 tablet (25 mg total) by mouth daily.    Dispense:  90 tablet    Refill:  1   ezetimibe  (ZETIA ) 10 MG tablet    Sig: Take 1 tablet (10 mg total) by mouth daily.    Dispense:  90 tablet    Refill:  1   hydrALAZINE  (APRESOLINE ) 100 MG tablet    Sig: Take 1 tablet (100 mg total) by mouth 3 (three) times daily.    Dispense:  270 tablet    Refill:  1   rosuvastatin  (CRESTOR ) 40 MG tablet    Sig: Take 1 tablet (40 mg total) by mouth daily.    Dispense:  90 tablet    Refill:  1    Follow-up: Return in about 6 months (around 02/24/2025) for Chronic medical conditions.       Corrina Sabin, MD, FAAFP. [facility] [place], [place] [phone]   08/27/2024, 5:27 PM    [1]  Allergies Allergen Reactions   Lisinopril  Swelling    Angioedema- lips/face   "

## 2024-08-28 NOTE — Telephone Encounter (Signed)
 Signed PCS request efaxed to [facility]: [phone].  I returned the call to [facility] and informed her that the patient was just seen by the provider yesterday and the signed PCS was faxed to [facility] today. Going forward, I informed her that she can check with [facility] about the status of the referral.

## 2024-08-28 NOTE — Telephone Encounter (Signed)
 Noted.

## 2024-08-28 NOTE — Telephone Encounter (Signed)
 Copied from CRM #[id number]. Topic: Clinical - Home Health Verbal Orders >> Aug 28, 2024  1:14 PM   Delon DASEN wrote:  Caller/Agency: [facility] Callback Number: [phone] Service Requested: pcs services Frequency: tbd Any new concerns about the patient? Yes- still waiting for the [facility] 3051 Form to be filled out and signed- was brought in 2 weeks ago- will call back at 1:30

## 2024-08-29 ENCOUNTER — Other Ambulatory Visit: Payer: Self-pay

## 2024-08-29 ENCOUNTER — Ambulatory Visit: Admitting: Physician Assistant

## 2024-08-29 DIAGNOSIS — M25561 Pain in right knee: Secondary | ICD-10-CM

## 2024-08-29 DIAGNOSIS — M1711 Unilateral primary osteoarthritis, right knee: Secondary | ICD-10-CM

## 2024-08-29 DIAGNOSIS — G8929 Other chronic pain: Secondary | ICD-10-CM

## 2024-08-29 MED ORDER — BUPIVACAINE HCL 0.25 % IJ SOLN
2.0000 mL | INTRAMUSCULAR | Status: AC | PRN
  Administered 2024-08-29: 2 mL via INTRA_ARTICULAR

## 2024-08-29 MED ORDER — LIDOCAINE HCL 1 % IJ SOLN
2.0000 mL | INTRAMUSCULAR | Status: AC | PRN
  Administered 2024-08-29: 2 mL

## 2024-08-29 MED ORDER — TRAMADOL HCL 50 MG PO TABS
50.0000 mg | ORAL_TABLET | Freq: Two times a day (BID) | ORAL | 0 refills | Status: AC | PRN
  Filled 2024-08-29: qty 30, 15d supply, fill #0

## 2024-08-29 MED ORDER — METHYLPREDNISOLONE ACETATE 40 MG/ML IJ SUSP
40.0000 mg | INTRAMUSCULAR | Status: AC | PRN
  Administered 2024-08-29: 40 mg via INTRA_ARTICULAR

## 2024-08-29 NOTE — Addendum Note (Signed)
 Addended by: JULE MORNA CROME on: 08/29/2024 09:18 AM   Modules accepted: Orders

## 2024-08-29 NOTE — Progress Notes (Addendum)
 "  Office Visit Note   Patient: Casey Petty           Date of Birth: Dec 24, 1960           MRN: [id number] Visit Date: 08/29/2024              Requested by: Delbert Clam, MD [address] [place] [address] [place],  [place] [address] PCP: Delbert Clam, MD   Assessment & Plan: Visit Diagnoses:  1. Unilateral primary osteoarthritis, right knee   2. Chronic pain of right knee     Plan: Impression is right knee osteoarthritis.  Today, we discussed various treatment options to include intra-articular cortisone injection for which she is agreeable to.  He will continue to work on range of motion exercises.  Follow-up with us  as needed.  Call with concerns or questions.  Follow-Up Instructions: Return if symptoms worsen or fail to improve.   Orders:  Orders Placed This Encounter  Procedures   Large Joint Inj: R knee   XR KNEE 3 VIEW RIGHT   Meds ordered this encounter  Medications   bupivacaine  (MARCAINE ) 0.25 % (with pres) injection 2 mL   lidocaine  (XYLOCAINE ) 1 % (with pres) injection 2 mL   methylPREDNISolone  acetate (DEPO-MEDROL ) injection 40 mg   traMADol  (ULTRAM ) 50 MG tablet    Sig: Take 1 tablet (50 mg total) by mouth 2 (two) times daily as needed.    Dispense:  30 tablet    Refill:  0      Procedures: Large Joint Inj: R knee on 08/29/2024 9:06 AM Indications: pain Details: 22 G needle, anterolateral approach Medications: 2 mL lidocaine  1 %; 2 mL bupivacaine  0.25 %; 40 mg methylPREDNISolone  acetate 40 MG/ML      Clinical Data: No additional findings.   Subjective: Chief Complaint  Patient presents with   Right Knee - Pain    HPI patient is a pleasant 64 year old gentleman who comes in today with right knee pain.  Symptoms have been ongoing for the past 1 to 2 months without any injury.  The pain is throughout the entire knee and only occurs with cold weather.  He is not taking anything for the pain.  No previous cortisone injections right knee.  Of note,  he is status post CVA from 2021 which caused right sided hemiparesis.  He does have a right foot drop and wears an AFO brace.  Review of Systems as detailed in HPI.  All others reviewed and are negative.   Objective: Vital Signs: There were no vitals taken for this visit.  Physical Exam well-developed well-nourished gentleman in no acute distress.  Alert and oriented x 3.  Ortho Exam right knee exam: No effusion.  Range of motion 0 to 95 degrees.  Medial joint line tenderness.  He does have dropfoot on the right.  Specialty Comments:  No specialty comments available.  Imaging: XR KNEE 3 VIEW RIGHT Result Date: 08/29/2024 X-rays demonstrate advanced degenerative changes to the medial and patellofemoral compartments    PMFS History: Patient Active Problem List   Diagnosis Date Noted   Chronic right shoulder pain 06/22/2020   Adhesive capsulitis of right shoulder 10/07/2019   Abnormality of gait 09/17/2019   Spastic hemiparesis of right dominant side (HCC) 07/02/2019   History of CVA with residual deficit 06/09/2019   Spastic hemiparesis (HCC)    Uncontrolled type 2 diabetes mellitus with hyperglycemia (HCC)    Noncompliance    Benign essential HTN    Newly diagnosed diabetes (  HCC)    Leucocytosis    Hemiparesis affecting right side as late effect of cerebrovascular accident [facility])    Basal ganglia stroke (HCC) 03/04/2019   Stroke (HCC) 02/28/2019   Essential hypertension 02/28/2019   Tobacco abuse 02/28/2019   Hyperglycemia 02/28/2019   Past Medical History:  Diagnosis Date   Diabetes mellitus without complication (HCC)    on meds   Hyperlipidemia    on meds   Hypertension    on meds   Neuromuscular disorder (HCC)    Stroke (HCC) 2020   R hemiparesis-abnormal gait    Family History  Problem Relation Age of Onset   Heart attack Father    Cancer Neg Hx    Colon polyps Neg Hx    Colon cancer Neg Hx    Esophageal cancer Neg Hx    Stomach cancer Neg Hx    Rectal  cancer Neg Hx     Past Surgical History:  Procedure Laterality Date   WISDOM TOOTH EXTRACTION Bilateral 06/2021   Social History   Occupational History   Not on file  Tobacco Use   Smoking status: Every Day    Current packs/day: 0.25    Types: Cigarettes   Smokeless tobacco: Never   Tobacco comments:    Smoking 4 cigs/day  Vaping Use   Vaping status: Never Used  Substance and Sexual Activity   Alcohol use: Yes    Comment: 2 times a week - beer   Drug use: No   Sexual activity: Never        "

## 2024-09-04 ENCOUNTER — Telehealth: Payer: Self-pay

## 2024-09-04 NOTE — Telephone Encounter (Signed)
 Copied from CRM [id number]. Topic: General - Other >> Sep 04, 2024  2:51 PM Macario HERO wrote: Reason for CRM: Curtistine from [facility] called regarding patient's form [id number] assessment for personal care services was done incorrectly. We need to resubmit it with correct wording. We put new request but it needs to be change of status medical. Also did not complete step 6.  Stated that it's urgent and is requesting a call back [phone]

## 2024-09-04 NOTE — Telephone Encounter (Signed)
 Please advise.

## 2024-09-05 ENCOUNTER — Telehealth: Payer: Self-pay

## 2024-09-05 NOTE — Telephone Encounter (Signed)
 Dr Newlin- We submitted the re-certification for PCS program.The agency that he receives the help from called and wants a change in status form completed and this would allow him more hours  Has there been a change in his status to support this request?

## 2024-09-05 NOTE — Telephone Encounter (Signed)
 noted

## 2024-09-05 NOTE — Telephone Encounter (Signed)
 No he has not had a change in status. Thanks

## 2024-09-05 NOTE — Telephone Encounter (Signed)
 If Curtistine calls back, we need to let her know that per Dr Delbert, there is not a change in his condition to support a change in status request for PCS.

## 2024-09-05 NOTE — Telephone Encounter (Signed)
 Call returned to Kad[facility]: [phone] message left with call back requested.  The voicemail did not have anyone's name so I only left a message requesting a call back. I did not leave any patient identifying information on the message.

## 2024-09-10 ENCOUNTER — Encounter: Attending: Physical Medicine & Rehabilitation | Admitting: Physical Medicine & Rehabilitation

## 2024-09-10 ENCOUNTER — Encounter: Payer: Self-pay | Admitting: Physical Medicine & Rehabilitation

## 2024-09-10 VITALS — BP 166/90 | HR 91 | Ht 67.0 in | Wt 140.0 lb

## 2024-09-10 DIAGNOSIS — G8111 Spastic hemiplegia affecting right dominant side: Secondary | ICD-10-CM | POA: Diagnosis not present

## 2024-09-10 MED ORDER — ABOBOTULINUMTOXINA 500 UNITS IM SOLR
1500.0000 [IU] | Freq: Once | INTRAMUSCULAR | Status: AC
  Administered 2024-09-10: 1500 [IU] via INTRAMUSCULAR

## 2024-09-10 MED ORDER — SODIUM CHLORIDE (PF) 0.9 % IJ SOLN
7.5000 mL | Freq: Once | INTRAMUSCULAR | Status: AC
  Administered 2024-09-10: 7.5 mL

## 2024-09-10 NOTE — Progress Notes (Signed)
 Dysport Injection for spasticity using needle EMG guidance  Pt has been responding well to botulinum toxin injections for stroke related RIght spastic hemiparesis  Dilution: 200 Units/ml Indication: Severe spasticity which interferes with ADL,mobility and/or  hygiene and is unresponsive to medication management and other conservative care Informed consent was obtained after describing risks and benefits of the procedure with the patient. This includes bleeding, bruising, infection, excessive weakness, or medication side effects. A REMS form is on file and signed. Needle:  needle electrode Number of units per muscle  RIGHT  Biceps:            200     Brachialis 200U  Brachioradialis 100 FCR                 200                   FDS                 200 FDP                 200 Lumbricals  100    Hamstrings 300U All injections were done after obtaining appropriate EMG activity and after negative drawback for blood. The patient tolerated the procedure well. Post procedure instructions were given.

## 2024-09-10 NOTE — Patient Instructions (Signed)
You received a Dysport injection today. You may experience muscle pains and aches. He may apply ice 20 minutes every 2 hours as needed for the next 24-48 hours. He also noticed bleeding or bruising in the areas that were injected. May apply Band-Aid. If this bruising is extensive, please notify our office. If there is evidence of increasing redness that occurs 2-3 days after injection. Please call our office. This could be a sign of infection. It is very rare, however. You may experience some muscle weakness in the muscles and injected. This would likely start in about one week.  

## 2024-09-23 ENCOUNTER — Ambulatory Visit: Admitting: Family Medicine

## 2024-10-23 ENCOUNTER — Ambulatory Visit: Admitting: Podiatry

## 2025-01-10 ENCOUNTER — Encounter: Attending: Physical Medicine & Rehabilitation | Admitting: Physical Medicine & Rehabilitation

## 2025-01-21 ENCOUNTER — Ambulatory Visit

## 2025-02-24 ENCOUNTER — Ambulatory Visit: Payer: Self-pay | Admitting: Family Medicine
